# Patient Record
Sex: Male | Born: 1955
Health system: Southern US, Community
[De-identification: ages and names within clinical notes are randomized; demographics above are authoritative.]

## PROBLEM LIST (undated history)

## (undated) DIAGNOSIS — K222 Esophageal obstruction: Secondary | ICD-10-CM

## (undated) DIAGNOSIS — D369 Benign neoplasm, unspecified site: Secondary | ICD-10-CM

## (undated) DIAGNOSIS — K219 Gastro-esophageal reflux disease without esophagitis: Secondary | ICD-10-CM

## (undated) DIAGNOSIS — M1990 Unspecified osteoarthritis, unspecified site: Secondary | ICD-10-CM

## (undated) DIAGNOSIS — T7840XA Allergy, unspecified, initial encounter: Secondary | ICD-10-CM

## (undated) DIAGNOSIS — M21619 Bunion of unspecified foot: Secondary | ICD-10-CM

## (undated) DIAGNOSIS — M2040 Other hammer toe(s) (acquired), unspecified foot: Secondary | ICD-10-CM

## (undated) DIAGNOSIS — L409 Psoriasis, unspecified: Secondary | ICD-10-CM

## (undated) DIAGNOSIS — G709 Myoneural disorder, unspecified: Secondary | ICD-10-CM

## (undated) DIAGNOSIS — L97509 Non-pressure chronic ulcer of other part of unspecified foot with unspecified severity: Secondary | ICD-10-CM

## (undated) DIAGNOSIS — E785 Hyperlipidemia, unspecified: Secondary | ICD-10-CM

## (undated) DIAGNOSIS — M216X9 Other acquired deformities of unspecified foot: Secondary | ICD-10-CM

## (undated) DIAGNOSIS — D649 Anemia, unspecified: Secondary | ICD-10-CM

## (undated) DIAGNOSIS — I10 Essential (primary) hypertension: Secondary | ICD-10-CM

## (undated) HISTORY — PX: POLYPECTOMY: SHX149

## (undated) HISTORY — DX: Benign neoplasm, unspecified site: D36.9

## (undated) HISTORY — DX: Unspecified osteoarthritis, unspecified site: M19.90

## (undated) HISTORY — PX: JOINT REPLACEMENT: SHX530

## (undated) HISTORY — DX: Essential (primary) hypertension: I10

## (undated) HISTORY — DX: Hyperlipidemia, unspecified: E78.5

## (undated) HISTORY — DX: Other acquired deformities of unspecified foot: M21.6X9

## (undated) HISTORY — DX: Gastro-esophageal reflux disease without esophagitis: K21.9

## (undated) HISTORY — DX: Other hammer toe(s) (acquired), unspecified foot: M20.40

## (undated) HISTORY — DX: Psoriasis, unspecified: L40.9

## (undated) HISTORY — PX: COLONOSCOPY: SHX174

## (undated) HISTORY — PX: EYE SURGERY: SHX253

## (undated) HISTORY — PX: TOTAL HIP ARTHROPLASTY: SHX124

## (undated) HISTORY — DX: Bunion of unspecified foot: M21.619

## (undated) HISTORY — DX: Myoneural disorder, unspecified: G70.9

## (undated) HISTORY — DX: Allergy, unspecified, initial encounter: T78.40XA

---

## 1991-07-25 HISTORY — PX: HIP FRACTURE SURGERY: SHX118

## 2001-04-23 ENCOUNTER — Encounter: Payer: Self-pay | Admitting: Internal Medicine

## 2003-02-19 ENCOUNTER — Encounter: Payer: Self-pay | Admitting: Orthopedic Surgery

## 2003-02-23 ENCOUNTER — Inpatient Hospital Stay (HOSPITAL_COMMUNITY): Admission: RE | Admit: 2003-02-23 | Discharge: 2003-02-26 | Payer: Self-pay | Admitting: Orthopedic Surgery

## 2004-02-08 ENCOUNTER — Encounter: Payer: Self-pay | Admitting: Internal Medicine

## 2004-02-11 ENCOUNTER — Encounter: Payer: Self-pay | Admitting: Internal Medicine

## 2004-08-25 ENCOUNTER — Ambulatory Visit: Payer: Self-pay | Admitting: Internal Medicine

## 2005-02-27 ENCOUNTER — Ambulatory Visit: Payer: Self-pay | Admitting: Internal Medicine

## 2005-07-10 ENCOUNTER — Ambulatory Visit: Payer: Self-pay | Admitting: Internal Medicine

## 2005-09-06 ENCOUNTER — Ambulatory Visit: Payer: Self-pay | Admitting: Internal Medicine

## 2006-03-02 ENCOUNTER — Ambulatory Visit: Payer: Self-pay | Admitting: Internal Medicine

## 2006-03-27 ENCOUNTER — Ambulatory Visit: Payer: Self-pay | Admitting: Internal Medicine

## 2006-07-30 ENCOUNTER — Ambulatory Visit: Payer: Self-pay | Admitting: Gastroenterology

## 2006-08-06 ENCOUNTER — Ambulatory Visit: Payer: Self-pay | Admitting: Gastroenterology

## 2006-08-06 LAB — HM COLONOSCOPY

## 2006-09-03 ENCOUNTER — Ambulatory Visit: Payer: Self-pay | Admitting: Internal Medicine

## 2006-09-26 ENCOUNTER — Ambulatory Visit: Payer: Self-pay | Admitting: Internal Medicine

## 2007-01-03 ENCOUNTER — Telehealth (INDEPENDENT_AMBULATORY_CARE_PROVIDER_SITE_OTHER): Payer: Self-pay | Admitting: *Deleted

## 2007-01-23 ENCOUNTER — Encounter: Payer: Self-pay | Admitting: Internal Medicine

## 2007-03-13 ENCOUNTER — Encounter: Payer: Self-pay | Admitting: Internal Medicine

## 2007-03-13 DIAGNOSIS — E785 Hyperlipidemia, unspecified: Secondary | ICD-10-CM | POA: Insufficient documentation

## 2007-03-13 DIAGNOSIS — E1149 Type 2 diabetes mellitus with other diabetic neurological complication: Secondary | ICD-10-CM

## 2007-03-13 DIAGNOSIS — K219 Gastro-esophageal reflux disease without esophagitis: Secondary | ICD-10-CM

## 2007-03-13 DIAGNOSIS — M109 Gout, unspecified: Secondary | ICD-10-CM

## 2007-03-13 DIAGNOSIS — M1610 Unilateral primary osteoarthritis, unspecified hip: Secondary | ICD-10-CM | POA: Insufficient documentation

## 2007-03-13 DIAGNOSIS — L408 Other psoriasis: Secondary | ICD-10-CM

## 2007-03-13 DIAGNOSIS — I10 Essential (primary) hypertension: Secondary | ICD-10-CM | POA: Insufficient documentation

## 2007-03-17 DIAGNOSIS — M1990 Unspecified osteoarthritis, unspecified site: Secondary | ICD-10-CM | POA: Insufficient documentation

## 2007-03-29 ENCOUNTER — Ambulatory Visit: Payer: Self-pay | Admitting: Internal Medicine

## 2007-06-11 ENCOUNTER — Telehealth (INDEPENDENT_AMBULATORY_CARE_PROVIDER_SITE_OTHER): Payer: Self-pay | Admitting: *Deleted

## 2007-08-21 ENCOUNTER — Telehealth (INDEPENDENT_AMBULATORY_CARE_PROVIDER_SITE_OTHER): Payer: Self-pay | Admitting: *Deleted

## 2007-08-28 ENCOUNTER — Telehealth (INDEPENDENT_AMBULATORY_CARE_PROVIDER_SITE_OTHER): Payer: Self-pay | Admitting: *Deleted

## 2007-10-01 ENCOUNTER — Ambulatory Visit: Payer: Self-pay | Admitting: Internal Medicine

## 2007-10-07 ENCOUNTER — Telehealth: Payer: Self-pay | Admitting: Internal Medicine

## 2008-04-03 ENCOUNTER — Ambulatory Visit: Payer: Self-pay | Admitting: Internal Medicine

## 2008-04-04 ENCOUNTER — Encounter: Payer: Self-pay | Admitting: Internal Medicine

## 2008-09-28 ENCOUNTER — Telehealth: Payer: Self-pay | Admitting: Internal Medicine

## 2008-10-26 ENCOUNTER — Ambulatory Visit: Payer: Self-pay | Admitting: Internal Medicine

## 2008-10-26 DIAGNOSIS — R5381 Other malaise: Secondary | ICD-10-CM

## 2008-10-26 DIAGNOSIS — R5383 Other fatigue: Secondary | ICD-10-CM

## 2008-10-27 ENCOUNTER — Encounter: Payer: Self-pay | Admitting: Internal Medicine

## 2009-01-11 ENCOUNTER — Telehealth (INDEPENDENT_AMBULATORY_CARE_PROVIDER_SITE_OTHER): Payer: Self-pay | Admitting: *Deleted

## 2009-05-24 ENCOUNTER — Telehealth: Payer: Self-pay | Admitting: Internal Medicine

## 2009-05-28 ENCOUNTER — Ambulatory Visit: Payer: Self-pay | Admitting: Internal Medicine

## 2009-09-14 ENCOUNTER — Telehealth: Payer: Self-pay | Admitting: Internal Medicine

## 2009-09-14 ENCOUNTER — Encounter: Payer: Self-pay | Admitting: Internal Medicine

## 2009-12-02 ENCOUNTER — Telehealth: Payer: Self-pay | Admitting: Internal Medicine

## 2009-12-21 ENCOUNTER — Ambulatory Visit: Payer: Self-pay | Admitting: Internal Medicine

## 2009-12-22 ENCOUNTER — Encounter (INDEPENDENT_AMBULATORY_CARE_PROVIDER_SITE_OTHER): Payer: Self-pay | Admitting: *Deleted

## 2009-12-31 ENCOUNTER — Encounter (INDEPENDENT_AMBULATORY_CARE_PROVIDER_SITE_OTHER): Payer: Self-pay | Admitting: *Deleted

## 2010-03-21 ENCOUNTER — Telehealth: Payer: Self-pay | Admitting: Internal Medicine

## 2010-08-02 ENCOUNTER — Ambulatory Visit
Admission: RE | Admit: 2010-08-02 | Discharge: 2010-08-02 | Payer: Self-pay | Source: Home / Self Care | Attending: Internal Medicine | Admitting: Internal Medicine

## 2010-08-02 ENCOUNTER — Encounter: Payer: Self-pay | Admitting: Internal Medicine

## 2010-08-02 LAB — HM DIABETES FOOT EXAM

## 2010-08-22 ENCOUNTER — Ambulatory Visit
Admission: RE | Admit: 2010-08-22 | Discharge: 2010-08-22 | Payer: Self-pay | Source: Home / Self Care | Attending: Internal Medicine | Admitting: Internal Medicine

## 2010-08-22 DIAGNOSIS — J069 Acute upper respiratory infection, unspecified: Secondary | ICD-10-CM | POA: Insufficient documentation
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
## 2010-08-23 NOTE — Miscellaneous (Signed)
Summary: med list update  Medications Added BAYER CONTOUR TEST  STRP (GLUCOSE BLOOD) check blood sugar two times a day       Clinical Lists Changes  Medications: Changed medication from ASCENSIA AUTODISC TEST  DISK (GLUCOSE BLOOD) check blood sugar twice a day to BAYER CONTOUR TEST  STRP (GLUCOSE BLOOD) check blood sugar two times a day     Prior Medications: LANTUS SOLOSTAR 100 UNIT/ML SOLN (INSULIN GLARGINE) Inject 15 unit subcutaneously every day METFORMIN HCL 1000 MG TABS (METFORMIN HCL) Take 1 tablet by mouth twice a day ASPIRIN 81 MG  TBEC (ASPIRIN) Take one by mouth once a day LISINOPRIL 20 MG  TABS (LISINOPRIL) Take one by mouth once a day SIMVASTATIN 40 MG  TABS (SIMVASTATIN) Take one by mouth once a day PRILOSEC 20 MG  CPDR (OMEPRAZOLE) Take one by mouth every morning INDOMETHACIN 25 MG  CAPS (INDOMETHACIN) 1 three times a day as needed for gout pain TRIAMCINOLONE ACETONIDE 0.1 %  LOTN (TRIAMCINOLONE ACETONIDE) apply to rash three times a day as needed ALLOPURINOL 300 MG TABS (ALLOPURINOL) take 1 by mouth once daily CALCIUM 500 MG TABS (CALCIUM CARBONATE) take 1 by mouth once daily CINNAMON 500 MG  CAPS (CINNAMON) two times a day ONETOUCH ULTRA SYSTEM W/DEVICE KIT (BLOOD GLUCOSE MONITORING SUPPL) patient test blood sugar 1-2 times a day dx:250.00 ONETOUCH ULTRA TEST  STRP (GLUCOSE BLOOD) patient test blood sugar 1-2 times a day dx:250.00 DUANE READE UNIFINE PENTIPS 31G X 6 MM MISC (INSULIN PEN NEEDLE) use 1 needle subcutaneously once a day CURITY ALCOHOL SWABS   PADS (ALCOHOL SWABS) 1 SWAP Q DAY DOVONEX 0.005 %  SOLN (CALCIPOTRIENE)  MULTIVITAMINS   TABS (MULTIPLE VITAMIN) Take one by mouth once a day BAYER CONTOUR TEST  STRP (GLUCOSE BLOOD) check blood sugar two times a day Current Allergies: * LODINE (ETODOLAC) GLIPIZIDE (GLIPIZIDE) * DECONGESTANTS ALTACE (RAMIPRIL) IBUPROFEN (IBUPROFEN)

## 2010-08-23 NOTE — Miscellaneous (Signed)
Summary: med list update- test strips  Medications Added ASCENSIA AUTODISC TEST  DISK (GLUCOSE BLOOD) check blood sugar twice a day       Clinical Lists Changes  Medications: Added new medication of ASCENSIA AUTODISC TEST  DISK (GLUCOSE BLOOD) check blood sugar twice a day     Prior Medications: LANTUS SOLOSTAR 100 UNIT/ML SOLN (INSULIN GLARGINE) Inject 15 unit subcutaneously every day METFORMIN HCL 1000 MG TABS (METFORMIN HCL) Take 1 tablet by mouth twice a day ASPIRIN 81 MG  TBEC (ASPIRIN) Take one by mouth once a day LISINOPRIL 20 MG  TABS (LISINOPRIL) Take one by mouth once a day SIMVASTATIN 40 MG  TABS (SIMVASTATIN) Take one by mouth once a day PRILOSEC 20 MG  CPDR (OMEPRAZOLE) Take one by mouth every morning INDOMETHACIN 25 MG  CAPS (INDOMETHACIN) 1 three times a day as needed for gout pain TRIAMCINOLONE ACETONIDE 0.1 %  LOTN (TRIAMCINOLONE ACETONIDE) apply to rash three times a day as needed ALLOPURINOL 300 MG TABS (ALLOPURINOL) take 1 by mouth once daily CALCIUM 500 MG TABS (CALCIUM CARBONATE) take 1 by mouth once daily CINNAMON 500 MG  CAPS (CINNAMON) two times a day ONETOUCH ULTRA SYSTEM W/DEVICE KIT (BLOOD GLUCOSE MONITORING SUPPL) patient test blood sugar 1-2 times a day dx:250.00 ONETOUCH ULTRA TEST  STRP (GLUCOSE BLOOD) patient test blood sugar 1-2 times a day dx:250.00 DUANE READE UNIFINE PENTIPS 31G X 6 MM MISC (INSULIN PEN NEEDLE) use 1 needle subcutaneously once a day CURITY ALCOHOL SWABS   PADS (ALCOHOL SWABS) 1 SWAP Q DAY DOVONEX 0.005 %  SOLN (CALCIPOTRIENE)  MULTIVITAMINS   TABS (MULTIPLE VITAMIN) Take one by mouth once a day ASCENSIA AUTODISC TEST  DISK (GLUCOSE BLOOD) check blood sugar twice a day Current Allergies: * LODINE (ETODOLAC) GLIPIZIDE (GLIPIZIDE) * DECONGESTANTS ALTACE (RAMIPRIL) IBUPROFEN (IBUPROFEN)

## 2010-08-23 NOTE — Progress Notes (Signed)
Summary: Fax from pt  Phone Note Outgoing Call Call back at [facility] Phone [phone]   Call placed by: DeShannon Katrinka Blazing CMA [facility] Dull),  Dec 02, 2009 8:57 AM Call placed to: Patient Summary of Call: calling pt to discuss the fax that we received from him regarding his appt, and wanting [place] to re-schedule his appt. Initial call taken by: Mervin Hack CMA [facility] Dull),  Dec 02, 2009 8:58 AM  Follow-up for Phone Call        left message on machine at home for patient to return my call.  DeShannon Smith CMA [facility] Dull)  Dec 02, 2009 8:58 AM   patient called back and scheduled physical on 12/21/2009 @ 9:30 and pt would like to get labs that day. Follow-up by: Mervin Hack CMA [facility] Dull),  Dec 02, 2009 4:58 PM

## 2010-08-23 NOTE — Medication Information (Signed)
Summary: One Touch Ultra/[facility]  One Touch Ultra/[facility]   Imported By: Lanelle Bal 09/17/2009 09:38:15  _____________________________________________________________________  External Attachment:    Type:   Image     Comment:   External Document

## 2010-08-23 NOTE — Progress Notes (Signed)
Summary: refill request for hydrocortisone cream  Phone Note Refill Request Message from:  Fax from Pharmacy  Refills Requested: Medication #1:  hydrocortisone cream Faxed request from [facility], previous prescriber was Dr. Esperanza Sheets- form is on your desk.  Initial call taken by: Lowella Petties CMA,  March 21, 2010 2:35 PM  Follow-up for Phone Call        okay to send Rx for 45gm x 1 Follow-up by: Cindee Salt MD,  March 21, 2010 6:21 PM  Additional Follow-up for Phone Call Additional follow up Details #1::        Rx faxed to pharmacy Additional Follow-up by: DeShannon Smith CMA [facility] Dull),  March 22, 2010 8:29 AM    New/Updated Medications: HYDROCORTISONE VALERATE 0.2 % CREA (HYDROCORTISONE VALERATE) use as directed Prescriptions: HYDROCORTISONE VALERATE 0.2 % CREA (HYDROCORTISONE VALERATE) use as directed  #45gm x 1   Entered by:   Mervin Hack CMA (AAMA)   Authorized by:   Cindee Salt MD   Signed by:   Mervin Hack CMA (AAMA) on 03/22/2010   Method used:   Electronically to        [facility] [phone]* (retail)       [address] [place], [place]  [address]       Ph: [phone]       Fax: [phone]   RxID:   [id number]

## 2010-08-23 NOTE — Progress Notes (Signed)
Summary: need to change glucometer and strips  Phone Note From Pharmacy   Caller: [facility] Summary of Call: Form from [facility] is on your desk.  They are asking to change one touch basic system and strips- which has been d/c'd. Initial call taken by: Lowella Petties CMA,  September 14, 2009 12:50 PM  Follow-up for Phone Call        okay to change to one-touch ultra please update med list Follow-up by: Cindee Salt MD,  September 14, 2009 1:54 PM  Additional Follow-up for Phone Call Additional follow up Details #1::        changed in chart and form faxed back. Additional Follow-up by: Mervin Hack CMA [facility] Dull),  September 14, 2009 4:29 PM    New/Updated Medications: ONETOUCH ULTRA SYSTEM W/DEVICE KIT (BLOOD GLUCOSE MONITORING SUPPL) patient test blood sugar 1-2 times a day dx:250.00 ONETOUCH ULTRA TEST  STRP (GLUCOSE BLOOD) patient test blood sugar 1-2 times a day dx:250.00
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
## 2010-12-09 NOTE — Op Note (Signed)
NAME:  [name], [name]                           ACCOUNT NO.:  [id number]   MEDICAL RECORD NO.:  [id number]                   PATIENT TYPE:  INP   LOCATION:  2899                                 FACILITY:  [facility]   PHYSICIAN:  John L. Rendall III, M.D.           DATE OF BIRTH:  1956-03-04   DATE OF PROCEDURE:  02/23/2003  DATE OF DISCHARGE:                                 OPERATIVE REPORT   PREOPERATIVE DIAGNOSIS:  End-stage osteoarthritis, left hip, status post  previous fracture with retained Knowles pins.   SURGICAL PROCEDURE:  AML total hip replacement and removal of three Knowles  pins.   POSTOPERATIVE DIAGNOSIS:  End-stage osteoarthritis, left hip, status post  previous fracture with retained Knowles pins.   SURGEON:  John L. Rendall, M.D.   ASSISTANT:  Richardean Canal, P.A.   ANESTHESIA:  General.   PATHOLOGY:  The patient has previous hip fracture over 10 years ago fixed  with three Knowles pins.  In addition there is end-stage bone against bone  of the left hip.  There is a very large hip socket and acetabulum and  femoral head.   DESCRIPTION OF PROCEDURE:  Under general anesthesia, the left hip is  prepared with Duraprep and draped as a sterile field in the right lateral  decubitus position.  The previous 14-inch incision is opened over the mid-10  inches.  Dissection is carried through skin and IT band, inserting a  Charnley retractor.  The greater trochanter is palpated and carefully  dissected around, and one screw head is immediately visible.  This turns out  to be a threaded K-wire with a screw head on it.  First the screw head is  removed and then the chuck and drill are attached and the wire is backed  out.  The other two screws require more searching, and they are found buried  in periosteal scar tissue.  They were removed over the next 20 minutes.  Once this is completed, the hip is internally rotated.  The hip capsule and  short external rotators are taken  down with electrocautery.  The hip capsule  and rotators are dissected free of each other and the capsule is opened in a  T-shaped manner.  The femoral head is then incompletely dislocated due to  severe scar tissue medially. It is necessary to actually cut the neck and  then lever it out with a combination of hip skid and corkscrew.  After some  minor difficulty it is removed.  The IM initiator and canal finder are used.  The canal is then progressively reamed to a 14.5, which gives excellent  chatter.  Trial seatings of rasps were then used, 12, 13.5, and 15, and  calcar planers are used.  The 15 standard was the best fit.  Attention is  then turned to the acetabulum. It is debrided of scarred ligamentum teres  and capsule inferiorly.  The  labrum is excised.  It is then progressively  reamed up to a size 57, and a 58 mm liner gives good rim fit and a trial  acetabulum is inserted.  The trial stem, large 15, is used and several  different neck lengths and so forth are tried to get the best foot.  The  best fit was the +4 acetabular liner with the 10 degree angle, with the  large-stature high-offset neck length, with a +12 hip ball in a size 36 mm.  Any shorter neck length or smaller ball gave more tendency toward  dislocation with internal rotation.  This combination gave the optimum  stability.  It was also necessary to remove some acetabular rim medially to  make sure it did not lever against it or get in the way.  The permanent  acetabulum was then obtained.  A Pinnacle acetabular cup size 58, 300  Series, was inserted with an apex hole eliminator.  The +4 10-degree liner  was then inserted.  One more trial of the high-offset neck with the +12 36  mm ball gave good fit and alignment, and permanent components were then  obtained and the large-stature high-offset stem was inserted with the +13 36  mm ball.  At this point excellent stability was obtained and pull-down test  revealed 2-3 mm  of shuck.  The wound was copiously irrigated.  The capsule  was closed with #1 Tycron.  Short external rotators reattached with the  same.  IT band closed with #1 Tycron, subcu with 0 and 2-0 Vicryl, and skin  with clips.  Operative time approximately two hours.  The patient tolerated  the procedure well and returned to recovery in good condition.  Blood loss  estimated at 800 mL.  None was given.                                               John L. Dorothyann Gibbs, M.D.    Renato Gails  D:  02/23/2003  T:  02/23/2003  Job:  [id number]

## 2010-12-21 ENCOUNTER — Encounter: Payer: Self-pay | Admitting: Internal Medicine

## 2010-12-23 ENCOUNTER — Encounter: Payer: Self-pay | Admitting: Internal Medicine

## 2010-12-23 DIAGNOSIS — Z0289 Encounter for other administrative examinations: Secondary | ICD-10-CM

## 2011-01-31 ENCOUNTER — Other Ambulatory Visit: Payer: Self-pay | Admitting: *Deleted

## 2011-01-31 MED ORDER — INDOMETHACIN 25 MG PO CAPS: 25.0000 mg | ORAL_CAPSULE | Freq: Three times a day (TID) | ORAL | Status: DC

## 2011-04-17 ENCOUNTER — Encounter: Payer: Self-pay | Admitting: Internal Medicine

## 2011-04-17 ENCOUNTER — Ambulatory Visit (INDEPENDENT_AMBULATORY_CARE_PROVIDER_SITE_OTHER): Payer: 59 | Admitting: Internal Medicine

## 2011-04-17 DIAGNOSIS — M109 Gout, unspecified: Secondary | ICD-10-CM

## 2011-04-17 DIAGNOSIS — E785 Hyperlipidemia, unspecified: Secondary | ICD-10-CM

## 2011-04-17 DIAGNOSIS — I10 Essential (primary) hypertension: Secondary | ICD-10-CM

## 2011-04-17 DIAGNOSIS — Z0000 Encounter for general adult medical examination without abnormal findings: Secondary | ICD-10-CM

## 2011-04-17 DIAGNOSIS — E119 Type 2 diabetes mellitus without complications: Secondary | ICD-10-CM

## 2011-04-17 MED ORDER — METFORMIN HCL 1000 MG PO TABS: 1000.0000 mg | ORAL_TABLET | Freq: Two times a day (BID) | ORAL | Status: DC

## 2011-04-17 MED ORDER — TRIAMCINOLONE ACETONIDE 0.1 % EX LOTN: 1.0000 "application " | TOPICAL_LOTION | Freq: Three times a day (TID) | CUTANEOUS | Status: DC

## 2011-04-17 MED ORDER — INSULIN PEN NEEDLE 31G X 6 MM MISC: 1.0000 | Freq: Every day | Status: DC

## 2011-04-17 MED ORDER — HYDROCORTISONE VALERATE 0.2 % EX CREA: 1.0000 "application " | TOPICAL_CREAM | CUTANEOUS | Status: DC

## 2011-04-17 MED ORDER — SIMVASTATIN 40 MG PO TABS: 40.0000 mg | ORAL_TABLET | Freq: Every day | ORAL | Status: DC

## 2011-04-17 MED ORDER — OMEPRAZOLE 20 MG PO CPDR: 20.0000 mg | DELAYED_RELEASE_CAPSULE | Freq: Every day | ORAL | Status: DC

## 2011-04-17 MED ORDER — LISINOPRIL 20 MG PO TABS: 20.0000 mg | ORAL_TABLET | Freq: Every day | ORAL | Status: DC

## 2011-04-17 MED ORDER — INSULIN GLARGINE 100 UNIT/ML ~~LOC~~ SOLN: 15.0000 [IU] | Freq: Every day | SUBCUTANEOUS | Status: DC
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
## 2011-04-17 NOTE — Assessment & Plan Note (Signed)
BP Readings from Last 3 Encounters:  04/17/11 142/88  08/22/10 140/83  08/02/10 148/80   Fair control No changes in meds--needs to work on lifestyle

## 2011-04-17 NOTE — Assessment & Plan Note (Signed)
Allopurinol didn't seem to help No obvious gouty lesions now

## 2011-04-17 NOTE — Assessment & Plan Note (Signed)
Discussed fitness Generally doing okay Will check PSA after discussion Rx for labs to [facility]

## 2011-04-17 NOTE — Assessment & Plan Note (Signed)
Seems to have slipped some Will check labs

## 2011-04-17 NOTE — Assessment & Plan Note (Signed)
Due for labs

## 2011-04-28 ENCOUNTER — Encounter: Payer: Self-pay | Admitting: Internal Medicine

## 2011-05-17 ENCOUNTER — Other Ambulatory Visit: Payer: Self-pay | Admitting: *Deleted

## 2011-05-18 MED ORDER — INDOMETHACIN 25 MG PO CAPS: 25.0000 mg | ORAL_CAPSULE | Freq: Three times a day (TID) | ORAL | Status: DC | PRN

## 2011-05-18 NOTE — Telephone Encounter (Signed)
Rx sent electronically.  

## 2011-06-26 ENCOUNTER — Other Ambulatory Visit: Payer: Self-pay | Admitting: *Deleted

## 2011-06-26 MED ORDER — INSULIN PEN NEEDLE 31G X 6 MM MISC: 15.0000 [IU] | Freq: Every day | Status: DC

## 2011-07-06 ENCOUNTER — Other Ambulatory Visit: Payer: Self-pay | Admitting: *Deleted

## 2011-07-06 MED ORDER — INSULIN GLARGINE 100 UNIT/ML ~~LOC~~ SOLN: 15.0000 [IU] | Freq: Every day | SUBCUTANEOUS | Status: DC

## 2011-07-06 NOTE — Telephone Encounter (Signed)
Per pt he only needs to insulin, not the pen tip needles, [facility]. rx sent to pharmacy by [facility]

## 2011-08-25 ENCOUNTER — Telehealth: Payer: Self-pay | Admitting: Internal Medicine

## 2011-08-25 MED ORDER — METFORMIN HCL 1000 MG PO TABS: 1000.0000 mg | ORAL_TABLET | Freq: Two times a day (BID) | ORAL | Status: DC

## 2011-08-25 MED ORDER — LISINOPRIL 20 MG PO TABS: 20.0000 mg | ORAL_TABLET | Freq: Every day | ORAL | Status: DC

## 2011-08-25 NOTE — Telephone Encounter (Signed)
See note he faxed 1 month Rx sent

## 2011-09-07 ENCOUNTER — Encounter: Payer: Self-pay | Admitting: *Deleted

## 2011-09-07 ENCOUNTER — Encounter: Payer: Self-pay | Admitting: Family Medicine

## 2011-09-07 ENCOUNTER — Ambulatory Visit (INDEPENDENT_AMBULATORY_CARE_PROVIDER_SITE_OTHER): Payer: 59 | Admitting: Family Medicine

## 2011-09-07 ENCOUNTER — Telehealth: Payer: Self-pay | Admitting: Internal Medicine

## 2011-09-07 DIAGNOSIS — M79609 Pain in unspecified limb: Secondary | ICD-10-CM

## 2011-09-07 DIAGNOSIS — J069 Acute upper respiratory infection, unspecified: Secondary | ICD-10-CM | POA: Insufficient documentation

## 2011-09-07 DIAGNOSIS — M79604 Pain in right leg: Secondary | ICD-10-CM | POA: Insufficient documentation

## 2011-09-07 NOTE — Patient Instructions (Addendum)
You have an upper respiratory infection however likely viral as early on. Take diabetic tussin for cough at night. Push fluids and plenty of rest. Nasal saline irrigation or neti pot to help drain sinuses. May use simple mucinex with plenty of fluid to help mobilize mucous. Let us know if fever >101.5, trouble opening/closing mouth, difficulty swallowing, or worsening or going on past 10 days. I wonder if right leg pain is coming from back. For now use stretching /strengthening exerises provided, use tylenol or low dose ibuprofen(motrin).   If worsening please return sooner than next appt with PCP.

## 2011-09-07 NOTE — Telephone Encounter (Signed)
Triage Record Num: [id number] Operator: Durward Mallard DiMatteis Patient Name: [name] Call Date & Time: 09/07/2011 9:39:02AM Patient Phone: [phone] PCP: Tillman Abide Patient Gender: Male PCP Fax : [phone] Patient DOB: 1956/02/03 Practice Name: [facility] Reason for Call: Caller: [place]/Patient; PCP: Tillman Abide I.; CB#: [phone]; Call regarding Upper Resp Infection; sore throat and ear pain and congestion; temp 99.0 at present time; he is also having pain in the right hip and pt is concerned if it could be neuropathy; sx started 09/05/11; Triaged per URI Guideline; See in 24 hr d/t facial pain and temp elevation; Appt made for today at 4:00pm Dr Reece Agar; will comply Protocol(s) Used: Upper Respiratory Infection (URI) Recommended Outcome per Protocol: See Provider within 24 hours Reason for Outcome: Facial pain (fullness, pressure, worsens with bending over), frontal headache, yellow-green nasal discharge AND any temperature elevation Generalized mild frontal headache unresponsive to home care measures Care Advice: ~ SYMPTOM / CONDITION MANAGEMENT 09/07/2011 9:49:01AM Page 1 of 1 [email or web address]

## 2011-09-07 NOTE — Progress Notes (Signed)
  Subjective:    Patient ID: [name], male    DOB: 03-26-1956, 56 y.o.   MRN: [id number]  HPI CC: ? Head cold  3d h/o ST (bad in am), head congestion, hoarse voice, temp to 99.1, + PNdrainage, RN and slight HA described as mild dull ache.  Has tried OTC remedies like excedrin sinus and headache.  Also tried cholraseptic  No fevers/chills, abd pain, n/v, rashes, ear pain or tooth pain.  Out of work 2/2 this.  Needs to ensure not contagious (works at [employer])  Wife recently sick as well.  No smokers at home.  No h/o asthma, COPD.  Per pt last A1c 6.3%  Oh by the way... 3-4 wk h/o right leg pain.  Lateral hip pain then no pain leg then pain again down the lateral leg inferior to head of fibula to ankle and numbness sole of right foot.  Worse with sitting certain positions and hard surfaces.  No trauma/injury that started this.  No lower back pain.  No saddle anesthesia.  No bowel/bladder incontinence.  No leg weakness.  H/o L hip replacement 2004.  NSAIDs upset stomach.  Past Medical History  Diagnosis Date  . Diabetes mellitus   . GERD (gastroesophageal reflux disease)   . Gout   . Hyperlipidemia   . Hypertension   . Arthritis   . Psoriasis     Review of Systems Per HPI    Objective:   Physical Exam  Nursing note and vitals reviewed. Constitutional: He appears well-developed and well-nourished. No distress.  HENT:  Head: Normocephalic and atraumatic.  Right Ear: Hearing, tympanic membrane, external ear and ear canal normal.  Left Ear: Hearing, tympanic membrane, external ear and ear canal normal.  Nose: No mucosal edema or rhinorrhea. Right sinus exhibits maxillary sinus tenderness. Right sinus exhibits no frontal sinus tenderness. Left sinus exhibits maxillary sinus tenderness. Left sinus exhibits no frontal sinus tenderness.  Mouth/Throat: Uvula is midline and mucous membranes are normal. Posterior oropharyngeal erythema present. No oropharyngeal exudate, posterior  oropharyngeal edema or tonsillar abscesses.       Mild max sinus pressure bilaterally  Eyes: Conjunctivae and EOM are normal. Pupils are equal, round, and reactive to light. No scleral icterus.  Neck: Normal range of motion. Neck supple.  Cardiovascular: Normal rate, regular rhythm, normal heart sounds and intact distal pulses.   No murmur heard. Pulmonary/Chest: Effort normal and breath sounds normal. No respiratory distress. He has no wheezes. He has no rales.  Musculoskeletal: He exhibits no edema.       No pain midline.  No paraspinous pain No pain at SIJ, GTB, or sciatic notch. Neg SLR test, no pain with int/ext rotation at hip. Neg FABER test  Lymphadenopathy:    He has no cervical adenopathy.  Neurological: He is alert. He has normal strength. A sensory deficit (decreased sensation right sole) is present.  Reflex Scores:      Patellar reflexes are 2+ on the right side and 2+ on the left side.      Achilles reflexes are 2+ on the right side and 2+ on the left side. Skin: Skin is warm and dry. No rash noted.      Assessment & Plan:

## 2011-09-07 NOTE — Assessment & Plan Note (Signed)
Anticipate discogenic. Supportive care - conservative therapy first.  Tylenol for pain.  May use low dose ibuprofen as tolerates this well. Provided with stretching/strengthening exercises from [facility] pt advisor. F/u with PCP in 1 mo. discussed reasons to return sooner No red flags today.

## 2011-09-07 NOTE — Assessment & Plan Note (Signed)
Given duration anticipate viral etiology. Supportive care as per instructions.  could be early sinusitis, still likely viral. Red flags to notify us discussed.

## 2011-09-12 ENCOUNTER — Telehealth: Payer: Self-pay

## 2011-09-12 MED ORDER — AMOXICILLIN 500 MG PO TABS: 1000.0000 mg | ORAL_TABLET | Freq: Two times a day (BID) | ORAL | Status: DC

## 2011-09-12 NOTE — Telephone Encounter (Signed)
Okay to send Rx for amoxicillin 500mg   2 tabs bid #40 x 0

## 2011-09-12 NOTE — Telephone Encounter (Signed)
Patient was seen by Dr. Sharen Hones on Thursday last week for URI.  Dr. Sharen Hones told patient that if he wasn't any better by the first of this week to call and they would give him a medication.  He is still having a runny nose and congestion with cough.  His pharmacy is [facility].

## 2011-09-12 NOTE — Telephone Encounter (Signed)
rx sent to pharmacy by e-script Spoke with patient and advised results   

## 2011-09-20 ENCOUNTER — Encounter: Payer: Self-pay | Admitting: Gastroenterology

## 2011-09-29 ENCOUNTER — Other Ambulatory Visit: Payer: Self-pay | Admitting: *Deleted

## 2011-09-29 MED ORDER — INSULIN GLARGINE 100 UNIT/ML ~~LOC~~ SOLN: 15.0000 [IU] | Freq: Every day | SUBCUTANEOUS | Status: DC

## 2011-09-29 NOTE — Telephone Encounter (Signed)
rx sent to pharmacy by e[facility]script, [facility] per pt request

## 2011-10-18 ENCOUNTER — Ambulatory Visit: Payer: 59 | Admitting: Internal Medicine

## 2011-10-31 ENCOUNTER — Ambulatory Visit: Payer: 59 | Admitting: Internal Medicine

## 2011-11-08 ENCOUNTER — Encounter: Payer: Self-pay | Admitting: Internal Medicine

## 2011-11-08 ENCOUNTER — Ambulatory Visit (INDEPENDENT_AMBULATORY_CARE_PROVIDER_SITE_OTHER): Payer: 59 | Admitting: Internal Medicine

## 2011-11-08 VITALS — BP 140/88 | HR 79 | Temp 98.1°F | Ht 72.0 in | Wt 220.0 lb

## 2011-11-08 DIAGNOSIS — M25559 Pain in unspecified hip: Secondary | ICD-10-CM

## 2011-11-08 DIAGNOSIS — Z23 Encounter for immunization: Secondary | ICD-10-CM

## 2011-11-08 DIAGNOSIS — E119 Type 2 diabetes mellitus without complications: Secondary | ICD-10-CM

## 2011-11-08 DIAGNOSIS — E785 Hyperlipidemia, unspecified: Secondary | ICD-10-CM

## 2011-11-08 DIAGNOSIS — I10 Essential (primary) hypertension: Secondary | ICD-10-CM

## 2011-11-08 DIAGNOSIS — M25551 Pain in right hip: Secondary | ICD-10-CM | POA: Insufficient documentation

## 2011-11-08 MED ORDER — TRIAMCINOLONE ACETONIDE 0.1 % EX LOTN: 1.0000 "application " | TOPICAL_LOTION | Freq: Three times a day (TID) | CUTANEOUS | Status: DC

## 2011-11-08 MED ORDER — HYDROCORTISONE VALERATE 0.2 % EX OINT: 1.0000 "application " | TOPICAL_OINTMENT | Freq: Two times a day (BID) | CUTANEOUS | Status: DC

## 2011-11-08 MED ORDER — GLUCOSE BLOOD VI STRP: 1.0000 | ORAL_STRIP | Freq: Two times a day (BID) | Status: DC

## 2011-11-08 MED ORDER — INSULIN GLARGINE 100 UNIT/ML ~~LOC~~ SOLN: 15.0000 [IU] | Freq: Every day | SUBCUTANEOUS | Status: DC

## 2011-11-08 MED ORDER — CURITY ALCOHOL SWABS PADS: 1.0000 | MEDICATED_PAD | Status: DC | PRN

## 2011-11-08 NOTE — Progress Notes (Signed)
Subjective:    Patient ID: [name], male    DOB: [birth year], 56 y.o.   MRN: [id number]  HPI Doing okay Continues on rx for psoriasis---lotion for itching--can spread it out Ointment for specific spots Rarely uses the dovonex  Diabetes seems to be fine Checks 3-4 times per week---out of strips for a week Fasting usually 120-130 Has had a couple of mild hypoglycemic reactions--like if he forgets to eat lunch  No chest pain No SOB Head is stopped up with the allergies--hasn't started his meds yet No edema No regular exercise---is starting yard work  Right leg pain has improved Now has pain in right hip  Current Outpatient Prescriptions on File Prior to Visit  Medication Sig Dispense Refill  . aspirin 81 MG tablet Take 81 mg by mouth daily.        . Calcipotriene (DOVONEX) 0.005 % ointment Apply topically 2 (two) times daily.        . calcium gluconate 500 MG tablet Take 500 mg by mouth daily.        . Cinnamon 500 MG capsule Take 500 mg by mouth 2 (two) times daily.        . Curity Alcohol Swabs PADS as needed.        . indomethacin (INDOCIN) 25 MG capsule Take 1 capsule (25 mg total) by mouth 3 (three) times daily as needed.  100 capsule  3  . insulin glargine (LANTUS SOLOSTAR) 100 UNIT/ML injection Inject 15 Units into the skin at bedtime.  9 mL  3  . Insulin Pen Needle 31G X 6 MM MISC Inject 15 Units as directed daily.  300 each  3  . lisinopril (PRINIVIL,ZESTRIL) 20 MG tablet Take 1 tablet (20 mg total) by mouth daily.  30 tablet  0  . metFORMIN (GLUCOPHAGE) 1000 MG tablet Take 1 tablet (1,000 mg total) by mouth 2 (two) times daily with a meal.  60 tablet  0  . Multiple Vitamin (MULTIVITAMIN) capsule Take 1 capsule by mouth daily.        Marland Kitchen omeprazole (PRILOSEC) 20 MG capsule Take 1 capsule (20 mg total) by mouth daily.  90 capsule  3  . simvastatin (ZOCOR) 40 MG tablet Take 1 tablet (40 mg total) by mouth at bedtime.  90 tablet  3  . triamcinolone (KENALOG) 0.1 % lotion  Apply 1 application topically 3 (three) times daily.  60 mL  3    Allergies  Allergen Reactions  . Glipizide     REACTION: wt. gain, hands tingling  . Ibuprofen     REACTION: unspecified  . Ramipril     REACTION: cough    Past Medical History  Diagnosis Date  . Diabetes mellitus   . GERD (gastroesophageal reflux disease)   . Gout   . Hyperlipidemia   . Hypertension   . Arthritis   . Psoriasis     Past Surgical History  Procedure Date  . Hip fracture surgery 1993    surgery x2  . Total hip arthroplasty     Left hip replacement/femur fx 07/04, Screw removal left hip- [facility] 11/01    Family History  Problem Relation Age of Onset  . Diabetes Mother   . Cancer Neg Hx     History   Social History  . Marital Status: Married    Spouse Name: N/A    Number of Children: 3  . Years of Education: N/A   Occupational History  . PC ADMINISTRATOR  Social History Main Topics  . Smoking status: Never Smoker   . Smokeless tobacco: Never Used  . Alcohol Use: Yes     occasional (DWI 1991)  . Drug Use: No  . Sexually Active: Not on file   Other Topics Concern  . 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 Gets "run down in afternoon"    Objective:   Physical Exam  Constitutional: He appears well-developed and well-nourished. No distress.  Neck: Normal range of motion. Neck supple.  Cardiovascular: Normal rate, regular rhythm, normal heart sounds and intact distal pulses.  Exam reveals no gallop.   No murmur heard. Pulmonary/Chest: Effort normal and breath sounds normal. No respiratory distress. He has no wheezes. He has no rales.  Musculoskeletal: He exhibits no edema and no tenderness.  Lymphadenopathy:    He has no cervical adenopathy.  Skin: No rash noted.       No foot lesions  Psychiatric: He has a  normal mood and affect. His behavior is normal.          Assessment & Plan:

## 2011-11-08 NOTE — Assessment & Plan Note (Signed)
No problems with med Labs next time

## 2011-11-08 NOTE — Patient Instructions (Signed)
Please set up an appointment with Dr Patsy Lager to evaluate the right hip pain

## 2011-11-08 NOTE — Assessment & Plan Note (Signed)
Will set up with Dr Copland 

## 2011-11-08 NOTE — Assessment & Plan Note (Signed)
Seems to still have acceptable control Will check A1c 

## 2011-11-08 NOTE — Assessment & Plan Note (Signed)
Acceptable control  BP Readings from Last 3 Encounters:  11/08/11 140/88  09/07/11 128/76  04/17/11 142/88

## 2011-11-09 LAB — HEMOGLOBIN A1C: Hgb A1c MFr Bld: 6.5 % — ABNORMAL HIGH (ref 4.8–5.6)

## 2011-11-13 ENCOUNTER — Encounter: Payer: Self-pay | Admitting: *Deleted

## 2011-11-27 ENCOUNTER — Encounter: Payer: Self-pay | Admitting: Family Medicine

## 2011-11-27 ENCOUNTER — Ambulatory Visit (INDEPENDENT_AMBULATORY_CARE_PROVIDER_SITE_OTHER): Payer: 59 | Admitting: Family Medicine

## 2011-11-27 VITALS — BP 140/90 | HR 112 | Temp 98.3°F | Ht 73.0 in | Wt 224.8 lb

## 2011-11-27 DIAGNOSIS — M25559 Pain in unspecified hip: Secondary | ICD-10-CM

## 2011-11-27 DIAGNOSIS — G5700 Lesion of sciatic nerve, unspecified lower limb: Secondary | ICD-10-CM

## 2011-11-27 DIAGNOSIS — M25551 Pain in right hip: Secondary | ICD-10-CM

## 2011-11-27 NOTE — Patient Instructions (Signed)

## 2011-11-27 NOTE — Progress Notes (Signed)
  Patient Name: [name] Date of Birth: Feb 01, 1956 Age: 56 y.o. Medical Record Number: [id number] Gender: male Date of Encounter: 11/27/2011  History of Present Illness:  [name] is a 56 y.o. very pleasant male patient who presents with the following:  Pleasant gentleman with a history of left total hip arthroplasty in 2004, status post severe traumatic fracture of the left hip in 1993, who presents with some posterior buttocks pain, and he also had some pain there was a distal radiculopathy sort of sensation they have been ongoing for about 6 weeks, but that has since improved.  Now, primarily he is having pain in his posterior buttocks region. No pain in the groin. No pain laterally. No pain in his back  Right hip, for about six weeks, had pain in distal leg for about six weeks, feels back in his buttocks.  THA on the left hip.   L trauma 1993 Severe hip pain, 2004   Past Medical History, Surgical History, Social History, Family History, Problem List, Medications, and Allergies have been reviewed and updated if relevant.  Review of Systems:  GEN: No fevers, chills. Nontoxic. Primarily MSK c/o today. MSK: Detailed in the HPI GI: tolerating PO intake without difficulty Neuro: detailed above Otherwise the pertinent positives of the ROS are noted above.    Physical Examination: Filed Vitals:   11/27/11 0903  BP: 140/90  Pulse: 112  Temp: 98.3 F (36.8 C)  TempSrc: Oral  Height: 6\' 1"  (1.854 m)  Weight: 224 lb 12.8 oz (101.969 kg)  SpO2: 98%    Body mass index is 29.66 kg/(m^2).   GEN: WDWN, NAD, Non-toxic, Alert & Oriented x 3 HEENT: Atraumatic, Normocephalic.  Ears and Nose: No external deformity. EXTR: No clubbing/cyanosis/edema NEURO: Normal gait.  PSYCH: Normally interactive. Conversant. Not depressed or anxious appearing.  Calm demeanor.   HIP EXAM: SIDE: R ROM: Abduction, Flexion, Internal and External range of motion: Full Pain with terminal IROM  and EROM: No GTB: NT SLR: NEG Knees: No effusion FABER: NT REVERSE FABER: NT, neg Piriformis: TTP significantly Str: flexion: 5/5 abduction: 5/5 adduction: 5/5 Strength testing non-tender     Assessment and Plan: 1. Piriformis syndrome   2. Right hip pain     >25 minutes spent in face to face time with patient, >50% spent in counselling or coordination of care  Piriformis Syndrome Also given a handout with more extensive Piriformis stretching, hip flexor and abductor strengthening, ham stretching  Rec deep massage, explained self-massage with ball

## 2011-12-06 ENCOUNTER — Telehealth: Payer: Self-pay | Admitting: *Deleted

## 2011-12-06 NOTE — Telephone Encounter (Signed)
Calling pt because we have a denial for his bayer contour strips from [facility], need to see if he can use another brand. .left message to have patient return my call.

## 2011-12-06 NOTE — Telephone Encounter (Signed)
Spoke with patient and he doesn't understand why his strips where denied, per pt they switched to this new company in January, pt states he will call to see what the problem is, I asked pt to let us know if we need to change anything.

## 2012-01-22 LAB — HM DIABETES EYE EXAM

## 2012-04-25 ENCOUNTER — Encounter: Payer: Self-pay | Admitting: Gastroenterology

## 2012-05-24 ENCOUNTER — Ambulatory Visit (INDEPENDENT_AMBULATORY_CARE_PROVIDER_SITE_OTHER): Payer: 59 | Admitting: Family Medicine

## 2012-05-24 ENCOUNTER — Encounter: Payer: Self-pay | Admitting: Family Medicine

## 2012-05-24 ENCOUNTER — Other Ambulatory Visit: Payer: Self-pay | Admitting: *Deleted

## 2012-05-24 VITALS — BP 132/72 | HR 96 | Temp 98.5°F | Wt 230.0 lb

## 2012-05-24 DIAGNOSIS — R509 Fever, unspecified: Secondary | ICD-10-CM

## 2012-05-24 MED ORDER — METFORMIN HCL 1000 MG PO TABS: 1000.0000 mg | ORAL_TABLET | Freq: Two times a day (BID) | ORAL | Status: DC

## 2012-05-24 MED ORDER — LISINOPRIL 20 MG PO TABS: 20.0000 mg | ORAL_TABLET | Freq: Every day | ORAL | Status: DC

## 2012-05-24 NOTE — Patient Instructions (Addendum)
Drink plenty of fluids, take tylenol as needed, and this should gradually improve.  Take care.  Let us know if you have other concerns.    

## 2012-05-24 NOTE — Progress Notes (Signed)
Mult sick contacts at work.  HA started yesterday.  Last night, ~7pm had chills and a fever- 102.  Took some antipyretics.  Fevers broke ~midnight, returned at 2AM this AM and this broke again ~6 AM.  No tick bites.  No ear pain, ST, cough.  Not sob.  No rash (except for some hives last week that had resolved and his baseline psoriasis), vomiting, diarrhea.  No voice changes.    Works at [employer].  Not working the rest of the day or the weekend.    Flying out of town 05/29/12.    GEN: nad, alert and oriented HEENT: mucous membranes moist, tm wnl, nasal exam wnl except for R sided stuffiness, OP wnl except for minimal cobblestoning NECK: supple w/o LA CV: rrr. PULM: ctab, no inc wob ABD: soft, +bs EXT: no edema SKIN: no acute rash

## 2012-05-26 DIAGNOSIS — R509 Fever, unspecified: Secondary | ICD-10-CM | POA: Insufficient documentation

## 2012-05-26 NOTE — Assessment & Plan Note (Signed)
Likely viral, nontoxic, should resolve.  Supportive tx, f/u prn.

## 2012-06-24 ENCOUNTER — Encounter: Payer: Self-pay | Admitting: Internal Medicine

## 2012-06-24 ENCOUNTER — Ambulatory Visit (INDEPENDENT_AMBULATORY_CARE_PROVIDER_SITE_OTHER): Payer: 59 | Admitting: Internal Medicine

## 2012-06-24 VITALS — BP 150/80 | HR 78 | Temp 98.4°F | Ht 72.0 in | Wt 229.0 lb

## 2012-06-24 DIAGNOSIS — I10 Essential (primary) hypertension: Secondary | ICD-10-CM

## 2012-06-24 DIAGNOSIS — Z23 Encounter for immunization: Secondary | ICD-10-CM

## 2012-06-24 DIAGNOSIS — Z0000 Encounter for general adult medical examination without abnormal findings: Secondary | ICD-10-CM

## 2012-06-24 DIAGNOSIS — K219 Gastro-esophageal reflux disease without esophagitis: Secondary | ICD-10-CM

## 2012-06-24 DIAGNOSIS — E785 Hyperlipidemia, unspecified: Secondary | ICD-10-CM

## 2012-06-24 DIAGNOSIS — E119 Type 2 diabetes mellitus without complications: Secondary | ICD-10-CM

## 2012-06-24 MED ORDER — TRIAMCINOLONE ACETONIDE 0.1 % EX LOTN: 1.0000 "application " | TOPICAL_LOTION | Freq: Three times a day (TID) | CUTANEOUS | Status: DC

## 2012-06-24 MED ORDER — LISINOPRIL 20 MG PO TABS: 20.0000 mg | ORAL_TABLET | Freq: Every day | ORAL | Status: DC

## 2012-06-24 MED ORDER — CALCIPOTRIENE 0.005 % EX SOLN: 1.0000 "application " | Freq: Two times a day (BID) | CUTANEOUS | Status: DC

## 2012-06-24 MED ORDER — METFORMIN HCL 1000 MG PO TABS: 1000.0000 mg | ORAL_TABLET | Freq: Two times a day (BID) | ORAL | Status: DC

## 2012-06-24 MED ORDER — SIMVASTATIN 40 MG PO TABS: 40.0000 mg | ORAL_TABLET | Freq: Every day | ORAL | Status: DC

## 2012-06-24 MED ORDER — HYDROCORTISONE VALERATE 0.2 % EX OINT: 1.0000 "application " | TOPICAL_OINTMENT | Freq: Two times a day (BID) | CUTANEOUS | Status: DC

## 2012-06-24 MED ORDER — ULTILET ALCOHOL SWAB PADS: 1.0000 "application " | MEDICATED_PAD | Freq: Two times a day (BID) | Status: DC

## 2012-06-24 MED ORDER — INSULIN GLARGINE 100 UNIT/ML ~~LOC~~ SOLN: 15.0000 [IU] | Freq: Every day | SUBCUTANEOUS | Status: DC

## 2012-06-24 MED ORDER — INSULIN PEN NEEDLE 31G X 6 MM MISC: 15.0000 [IU] | Freq: Every day | Status: DC

## 2012-06-24 MED ORDER — OMEPRAZOLE 20 MG PO CPDR: 20.0000 mg | DELAYED_RELEASE_CAPSULE | Freq: Every day | ORAL | Status: DC

## 2012-06-24 MED ORDER — ONETOUCH BASIC SYSTEM W/DEVICE KIT: PACK | Status: DC

## 2012-06-24 MED ORDER — GLUCOSE BLOOD VI STRP: ORAL_STRIP | Status: DC

## 2012-06-24 MED ORDER — INDOMETHACIN 25 MG PO CAPS: 25.0000 mg | ORAL_CAPSULE | Freq: Three times a day (TID) | ORAL | Status: DC | PRN

## 2012-06-24 NOTE — Addendum Note (Signed)
Addended by: Sueanne Margarita on: 06/24/2012 04:18 PM   Modules accepted: Orders

## 2012-06-24 NOTE — Assessment & Plan Note (Signed)
hopefully still good control Due for labs

## 2012-06-24 NOTE — Assessment & Plan Note (Signed)
Fine on the med 

## 2012-06-24 NOTE — Assessment & Plan Note (Signed)
BP Readings from Last 3 Encounters:  06/24/12 150/80  05/24/12 132/72  11/27/11 140/90   Repeat on right 144/86  No changes for now He will work on fitness

## 2012-06-24 NOTE — Assessment & Plan Note (Signed)
Healthy Needs to work more on fitness Discussed PSA---will defer to at least next year

## 2012-06-24 NOTE — Progress Notes (Signed)
Subjective:    Patient ID: [name], male    DOB: 1956/02/05, 56 y.o.   MRN: [id number]  HPI Fever did resolve quickly  Has had feeling of congestion in right ear--like in airplane Is able to pop it by pulling on it (the pinna) Wants it checked Annoying but not painful  Having problems with right foot Blister after ?rock in foot Apparently got infected and he saw Dr Al Corpus 3 different bacteria including MRSA Took 2 different antibiotics for 28 days----clinda and another  Checks sugars several times a week--- out of strips for some time Needs new meter and blood strips---Rx given  Current Outpatient Prescriptions on File Prior to Visit  Medication Sig Dispense Refill  . aspirin 81 MG tablet Take 81 mg by mouth daily.        . calcium gluconate 500 MG tablet Take 500 mg by mouth daily.        . Cinnamon 500 MG capsule Take 500 mg by mouth 2 (two) times daily.        . Multiple Vitamin (MULTIVITAMIN) capsule Take 1 capsule by mouth daily.        . [DISCONTINUED] insulin glargine (LANTUS SOLOSTAR) 100 UNIT/ML injection Inject 15 Units into the skin at bedtime.  10 mL  3  . [DISCONTINUED] lisinopril (PRINIVIL,ZESTRIL) 20 MG tablet Take 1 tablet (20 mg total) by mouth daily.  90 tablet  0  . [DISCONTINUED] metFORMIN (GLUCOPHAGE) 1000 MG tablet Take 1 tablet (1,000 mg total) by mouth 2 (two) times daily with a meal.  180 tablet  0  . [DISCONTINUED] omeprazole (PRILOSEC) 20 MG capsule Take 1 capsule (20 mg total) by mouth daily.  90 capsule  3  . [DISCONTINUED] simvastatin (ZOCOR) 40 MG tablet Take 1 tablet (40 mg total) by mouth at bedtime.  90 tablet  3    Allergies  Allergen Reactions  . Glipizide     REACTION: wt. gain, hands tingling  . Ibuprofen     REACTION: unspecified  . Ramipril     REACTION: cough    Past Medical History  Diagnosis Date  . Diabetes mellitus   . GERD (gastroesophageal reflux disease)   . Gout   . Hyperlipidemia   . Hypertension   . Arthritis     . Psoriasis     Past Surgical History  Procedure Date  . Hip fracture surgery 1993    surgery x2  . Total hip arthroplasty     Left hip replacement/femur fx 07/04, Screw removal left hip- [facility] 11/01    Family History  Problem Relation Age of Onset  . Diabetes Mother   . Cancer Neg Hx     History   Social History  . Marital Status: Married    Spouse Name: N/A    Number of Children: 3  . Years of Education: N/A   Occupational History  . PC ADMINISTRATOR    Social History Main Topics  . Smoking status: Never Smoker   . Smokeless tobacco: Never Used  . Alcohol Use: Yes     Comment: occasional (DWI 1991)  . Drug Use: No  . Sexually Active: Not on file   Other Topics Concern  . 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       Wears seat belt  HENT: Positive for congestion, rhinorrhea and  tinnitus. Negative for hearing loss.        Constant tinnitus for years regular with dentist Allergy problems seem better this year  Eyes: Negative for visual disturbance.       No diplopia or unilateral vision loss  Respiratory: Negative for cough, chest tightness and shortness of breath.   Cardiovascular: Negative for chest pain, palpitations and leg swelling.  Gastrointestinal: Negative for nausea, vomiting, abdominal pain, diarrhea, constipation and blood in stool.       No heartburn on the omeprazole  Genitourinary: Negative for urgency, frequency and difficulty urinating.       Somewhat slower stream No sexual problems  Musculoskeletal: Negative for back pain, joint swelling and arthralgias.       No recent gout issues  Skin: Positive for rash.       Psoriasis is more noticeable in winter--- creams are helpful for itching  Had hives with one of the antibiotics---stopped and they resolved (did rechallenge without a problem)  Neurological:  Positive for light-headedness and headaches. Negative for dizziness, syncope, weakness and numbness.       Occasional headaches lately---seem to be sinus   Hematological: Negative for adenopathy. Bruises/bleeds easily.       Bruises easy on the aspirin  Psychiatric/Behavioral: Negative for sleep disturbance and dysphoric mood. The patient is not nervous/anxious.        Objective:   Physical Exam  Constitutional: He is oriented to person, place, and time. He appears well-developed and well-nourished. No distress.  HENT:  Head: Normocephalic and atraumatic.  Right Ear: External ear normal.  Left Ear: External ear normal.  Mouth/Throat: Oropharynx is clear and moist. No oropharyngeal exudate.  Eyes: Conjunctivae normal and EOM are normal. Pupils are equal, round, and reactive to light.  Neck: Normal range of motion. Neck supple. No thyromegaly present.  Cardiovascular: Normal rate, regular rhythm, normal heart sounds and intact distal pulses.  Exam reveals no gallop.   No murmur heard. Pulmonary/Chest: Effort normal and breath sounds normal. No respiratory distress. He has no wheezes. He has no rales.  Abdominal: Soft. There is no tenderness.  Musculoskeletal: He exhibits no edema and no tenderness.  Lymphadenopathy:    He has no cervical adenopathy.  Neurological: He is alert and oriented to person, place, and time.  Skin:       No signs of infection in right foot now Just slight plantar scab at site of infection now  Psychiatric: He has a normal mood and affect. His behavior is normal.          Assessment & Plan:

## 2012-06-24 NOTE — Assessment & Plan Note (Signed)
No problems with meds Due for recheck

## 2012-06-26 LAB — BASIC METABOLIC PANEL
Calcium: 9.4 mg/dL (ref 8.7–10.2)
Creatinine, Ser: 0.96 mg/dL (ref 0.76–1.27)
GFR calc Af Amer: 102 mL/min/{1.73_m2} (ref >59)
GFR calc non Af Amer: 88 mL/min/{1.73_m2} (ref >59)
Glucose: 94 mg/dL (ref 65–99)

## 2012-06-26 LAB — LIPID PANEL
Cholesterol, Total: 150 mg/dL (ref 100–199)
HDL: 64 mg/dL (ref >39)
LDL Calculated: 67 mg/dL (ref 0–99)
Triglycerides: 95 mg/dL (ref 0–149)

## 2012-06-26 LAB — CBC WITH DIFFERENTIAL/PLATELET
Basos: 1 % (ref 0–3)
Eos: 3 % (ref 0–5)
HCT: 36.1 % — ABNORMAL LOW (ref 37.5–51.0)
Hemoglobin: 12.5 g/dL — ABNORMAL LOW (ref 12.6–17.7)
Lymphocytes Absolute: 1.8 10*3/uL (ref 0.7–3.1)
MCV: 91 fL (ref 79–97)
Monocytes Absolute: 0.5 10*3/uL (ref 0.1–0.9)
Monocytes: 8 % (ref 4–12)
Neutrophils Absolute: 3 10*3/uL (ref 1.4–7.0)
WBC: 5.4 10*3/uL (ref 3.4–10.8)

## 2012-06-26 LAB — HEPATIC FUNCTION PANEL
Albumin: 4.5 g/dL (ref 3.5–5.5)
Alkaline Phosphatase: 83 IU/L (ref 39–117)
Bilirubin, Direct: 0.22 mg/dL (ref 0.00–0.40)
Total Bilirubin: 0.5 mg/dL (ref 0.0–1.2)
Total Protein: 6.6 g/dL (ref 6.0–8.5)

## 2012-06-26 LAB — MICROALBUMIN / CREATININE URINE RATIO: MICROALB/CREAT RATIO: 12.7 mg/g creat (ref 0.0–30.0)

## 2012-06-26 LAB — HEMOGLOBIN A1C: Est. average glucose Bld gHb Est-mCnc: 163 mg/dL

## 2012-06-27 ENCOUNTER — Encounter: Payer: Self-pay | Admitting: *Deleted

## 2012-12-31 ENCOUNTER — Ambulatory Visit: Payer: 59 | Admitting: Internal Medicine

## 2013-01-03 ENCOUNTER — Ambulatory Visit: Payer: Self-pay | Admitting: Podiatry

## 2013-01-03 LAB — CREATININE, SERUM
EGFR (African American): >60
EGFR (Non-African Amer.): >60

## 2013-01-21 ENCOUNTER — Encounter: Payer: Self-pay | Admitting: Internal Medicine

## 2013-01-21 ENCOUNTER — Ambulatory Visit (INDEPENDENT_AMBULATORY_CARE_PROVIDER_SITE_OTHER): Payer: 59 | Admitting: Internal Medicine

## 2013-01-21 VITALS — BP 158/80 | HR 88 | Temp 98.3°F | Wt 231.0 lb

## 2013-01-21 DIAGNOSIS — K219 Gastro-esophageal reflux disease without esophagitis: Secondary | ICD-10-CM

## 2013-01-21 DIAGNOSIS — E1149 Type 2 diabetes mellitus with other diabetic neurological complication: Secondary | ICD-10-CM

## 2013-01-21 DIAGNOSIS — I10 Essential (primary) hypertension: Secondary | ICD-10-CM

## 2013-01-21 DIAGNOSIS — E785 Hyperlipidemia, unspecified: Secondary | ICD-10-CM

## 2013-01-21 MED ORDER — LISINOPRIL 40 MG PO TABS: 40.0000 mg | ORAL_TABLET | Freq: Every day | ORAL | Status: DC

## 2013-01-21 NOTE — Patient Instructions (Signed)
Please increase the lisinopril to 40mg  daily Please get a pair of stability shoes to help your feet.

## 2013-01-21 NOTE — Assessment & Plan Note (Signed)
BP Readings from Last 3 Encounters:  01/21/13 158/80  06/24/12 150/80  05/24/12 132/72   Repeat 154/94 on right Will increase the lisinopril

## 2013-01-21 NOTE — Assessment & Plan Note (Signed)
Hopefully still good control Mild neuropathy in feet Check A1c

## 2013-01-21 NOTE — Assessment & Plan Note (Signed)
Symptoms controlled the PPI Should give protection while on the indomethacin

## 2013-01-21 NOTE — Assessment & Plan Note (Signed)
No problems with the statin Lab Results  Component Value Date   LDLCALC 67 06/24/2012

## 2013-01-21 NOTE — Progress Notes (Signed)
Subjective:    Patient ID: [name], male    DOB: 1956-04-08, 57 y.o.   MRN: [id number]  HPI Having trouble with his right foot Toes are curling up and is going to have to have surgery Just had MRI and is awaiting the results Both feet are numb---right more than left Did have some cortisone shots---helped the pain for a while This has limited his exercise Using the indocin bid regularly  Checks sugars every other day Up and down Did have hypoglycemic reactions this past weekend-- afternoon (no neuroglycopenia)  Blood pressure has been up some as well 166/86 in store Occasional sinus headaches No chest pain No SOB  No problems with the statin  No apparent gout Not apparently the reason for the foot problems  Current Outpatient Prescriptions on File Prior to Visit  Medication Sig Dispense Refill  . Alcohol Swabs (ULTILET ALCOHOL SWAB) PADS Apply 1 application topically 2 (two) times daily.  180 each  3  . aspirin 81 MG tablet Take 81 mg by mouth daily.        . Blood Glucose Monitoring Suppl (ONE TOUCH BASIC SYSTEM) W/DEVICE KIT 1 unit to test blood sugar twice daily. Dx: 250.00  1 each  0  . Calcipotriene (DOVONEX) 0.005 % solution Apply 1 application topically 2 (two) times daily.  60 mL  3  . calcium gluconate 500 MG tablet Take 500 mg by mouth daily.        . Cinnamon 500 MG capsule Take 500 mg by mouth 2 (two) times daily.        Marland Kitchen glucose blood (ONE TOUCH TEST STRIPS) test strip 1 each by Other route 2 (two) times daily. Dx: 250.00 Use as instructed  200 each  3  . hydrocortisone valerate ointment (WEST-CORT) 0.2 % Apply 1 application topically 2 (two) times daily.  60 g  3  . indomethacin (INDOCIN) 25 MG capsule Take 1 capsule (25 mg total) by mouth 3 (three) times daily as needed.  90 capsule  3  . insulin glargine (LANTUS SOLOSTAR) 100 UNIT/ML injection Inject 15 Units into the skin at bedtime.  10 mL  3  . Insulin Pen Needle 31G X 6 MM MISC Inject 15 Units as  directed daily.  200 each  3  . lisinopril (PRINIVIL,ZESTRIL) 20 MG tablet Take 1 tablet (20 mg total) by mouth daily.  90 tablet  3  . metFORMIN (GLUCOPHAGE) 1000 MG tablet Take 1 tablet (1,000 mg total) by mouth 2 (two) times daily with a meal.  180 tablet  3  . Multiple Vitamin (MULTIVITAMIN) capsule Take 1 capsule by mouth daily.        Marland Kitchen omeprazole (PRILOSEC) 20 MG capsule Take 1 capsule (20 mg total) by mouth daily.  90 capsule  3  . simvastatin (ZOCOR) 40 MG tablet Take 1 tablet (40 mg total) by mouth at bedtime.  90 tablet  3  . triamcinolone lotion (KENALOG) 0.1 % Apply 1 application topically 3 (three) times daily.  60 mL  3   No current facility-administered medications on file prior to visit.    Allergies  Allergen Reactions  . Glipizide     REACTION: wt. gain, hands tingling  . Ibuprofen     REACTION: unspecified  . Ramipril     REACTION: cough    Past Medical History  Diagnosis Date  . Diabetes mellitus   . GERD (gastroesophageal reflux disease)   . Gout   . Hyperlipidemia   .  Hypertension   . Arthritis   . Psoriasis     Past Surgical History  Procedure Laterality Date  . Hip fracture surgery  1993    surgery x2  . Total hip arthroplasty      Left hip replacement/femur fx 07/04, Screw removal left hip- Hines 11/01    Family History  Problem Relation Age of Onset  . Diabetes Mother   . Cancer Neg Hx     History   Social History  . Marital Status: Married    Spouse Name: N/A    Number of Children: 3  . Years of Education: N/A   Occupational History  . PC ADMINISTRATOR    Social History Main Topics  . Smoking status: Never Smoker   . Smokeless tobacco: Never Used  . Alcohol Use: Yes     Comment: occasional (DWI 1991)  . Drug Use: No  . Sexually Active: Not on file   Other Topics Concern  . 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 No distress.  Neck: Normal range of motion. Neck supple. No thyromegaly present.  Cardiovascular: Normal rate, regular rhythm, normal heart sounds and intact distal pulses.  Exam reveals no gallop.   No murmur heard. Pulmonary/Chest: Effort normal and breath sounds normal. No respiratory distress. He has no wheezes. He has no rales.  Musculoskeletal: He exhibits no edema.  Mild bunion on right Mild hammertoes in 2nd and 3rd right toes  Lymphadenopathy:    He has no cervical adenopathy.  Skin: No rash noted. No erythema.  Psychiatric: He has a normal mood and affect. His behavior is normal.          Assessment & Plan:

## 2013-01-22 LAB — HEMOGLOBIN A1C: Est. average glucose Bld gHb Est-mCnc: 163 mg/dL

## 2013-01-28 ENCOUNTER — Encounter: Payer: Self-pay | Admitting: Internal Medicine

## 2013-02-28 HISTORY — PX: FOOT SURGERY: SHX648

## 2013-04-02 DIAGNOSIS — M21619 Bunion of unspecified foot: Secondary | ICD-10-CM

## 2013-04-02 HISTORY — DX: Bunion of unspecified foot: M21.619

## 2013-04-15 ENCOUNTER — Encounter: Payer: Self-pay | Admitting: *Deleted

## 2013-04-15 DIAGNOSIS — M2040 Other hammer toe(s) (acquired), unspecified foot: Secondary | ICD-10-CM | POA: Insufficient documentation

## 2013-04-15 DIAGNOSIS — M216X9 Other acquired deformities of unspecified foot: Secondary | ICD-10-CM | POA: Insufficient documentation

## 2013-04-24 ENCOUNTER — Encounter: Payer: Self-pay | Admitting: Podiatry

## 2013-04-24 ENCOUNTER — Ambulatory Visit (INDEPENDENT_AMBULATORY_CARE_PROVIDER_SITE_OTHER): Payer: 59

## 2013-04-24 ENCOUNTER — Ambulatory Visit (INDEPENDENT_AMBULATORY_CARE_PROVIDER_SITE_OTHER): Payer: 59 | Admitting: Podiatry

## 2013-04-24 VITALS — BP 140/86 | HR 87 | Temp 97.9°F | Resp 16 | Ht 73.0 in | Wt 232.4 lb

## 2013-04-24 DIAGNOSIS — S8290XD Unspecified fracture of unspecified lower leg, subsequent encounter for closed fracture with routine healing: Secondary | ICD-10-CM

## 2013-04-24 DIAGNOSIS — S92901A Unspecified fracture of right foot, initial encounter for closed fracture: Secondary | ICD-10-CM | POA: Insufficient documentation

## 2013-04-24 DIAGNOSIS — M2010 Hallux valgus (acquired), unspecified foot: Secondary | ICD-10-CM

## 2013-04-24 DIAGNOSIS — S92901G Unspecified fracture of right foot, subsequent encounter for fracture with delayed healing: Secondary | ICD-10-CM

## 2013-04-24 NOTE — Progress Notes (Signed)
Mr. Stehlik presents today his date of surgery was 04/02/2013. Consisting of an [place] bunion repair right second metatarsal osteotomy right fifth metatarsal osteotomy right hammertoe repair #2 #3 and #5 right. He states that one week after I saw him last, which would've been around September 17, he recall injuring his surgical foot. He states since that time it has been swollen and painful. He denies fever chills nausea or vomiting.  Objective: He presents today with a surgical shoe and a compression dressing. He has moderate to severe edema no erythema cellulitis drainage or odor. Tenderness on range of motion of the first and second metatarsophalangeal joints right foot. Radiographic evaluation today does demonstrate a dorsiflexor reinjury that has resulted in distraction of the first metatarsal osteotomy, fracture of the second metatarsal osteotomy, dislocation of the third metatarsal phalangeal joint, and fracture of the fifth metatarsal osteotomy site.  Assessment: Multiple fractures to the right foot sustained after traumatic injury.  Plan: We discussed in great detail today repair of these postsurgical fractures. We went over consent form today, line bylined number by number giving him ample time to ask questions he saw fit regarding open reduction and internal fixation of the aforementioned fracture sites. I answered all the questions regarding his procedures to the best of my ability in layman's terms. He understood this and was amenable to it. He understands that the outcome of this surgery is probably going to be less than perfect secondary to previous surgical procedures. He saw Dr. pages of the consent form. I will followup with him next week at the [facility] specialty surgical center.

## 2013-04-24 NOTE — Patient Instructions (Signed)
Schedule for Surgery next Friday.  Ice and elevate right foot and use compression until day of surgery.  I suggest you use the cam boot until we get this repaired.

## 2013-04-25 ENCOUNTER — Telehealth: Payer: Self-pay | Admitting: *Deleted

## 2013-04-25 NOTE — Telephone Encounter (Signed)
Pt called and left a voicemail asking if we could email him his xrays of his right foot from yesterday, also needed to clarify the shoe is was suppose to be wearing.  Called pt back told him we could not email xray to him.. Also he is to be wearing air fracture walker. Pt understood

## 2013-05-01 ENCOUNTER — Other Ambulatory Visit: Payer: Self-pay | Admitting: Podiatry

## 2013-05-01 MED ORDER — PROMETHAZINE HCL 25 MG PO TABS: 25.0000 mg | ORAL_TABLET | Freq: Three times a day (TID) | ORAL | Status: DC | PRN

## 2013-05-01 MED ORDER — CEPHALEXIN 500 MG PO CAPS: 500.0000 mg | ORAL_CAPSULE | Freq: Three times a day (TID) | ORAL | Status: DC

## 2013-05-01 MED ORDER — OXYCODONE-ACETAMINOPHEN 10-325 MG PO TABS: ORAL_TABLET | ORAL | Status: DC

## 2013-05-02 ENCOUNTER — Encounter: Payer: Self-pay | Admitting: Podiatry

## 2013-05-02 DIAGNOSIS — S92309A Fracture of unspecified metatarsal bone(s), unspecified foot, initial encounter for closed fracture: Secondary | ICD-10-CM

## 2013-05-02 DIAGNOSIS — Z472 Encounter for removal of internal fixation device: Secondary | ICD-10-CM

## 2013-05-05 ENCOUNTER — Telehealth: Payer: Self-pay | Admitting: *Deleted

## 2013-05-05 NOTE — Telephone Encounter (Signed)
CALLED TO CHECK ON [name], TO SEE HOW HE WAS DOING AFTER SURGERY, HE STATED HE WAS DOING AS GOO AS HE CAN NO UNCOMFORTABLE BANDAGE, NO MORE DRAINAGE COMING FROM BANDAGE   NO SHORTNESS OF BREATHING   NO CALF PAIN ON SCALE FROM 1-10 HE RATES HIS PAIN AS A 2  PATIENT IS TO CALL us IF NEEDED

## 2013-05-08 ENCOUNTER — Ambulatory Visit (INDEPENDENT_AMBULATORY_CARE_PROVIDER_SITE_OTHER): Payer: 59 | Admitting: Podiatry

## 2013-05-08 ENCOUNTER — Encounter: Payer: Self-pay | Admitting: Podiatry

## 2013-05-08 ENCOUNTER — Ambulatory Visit (INDEPENDENT_AMBULATORY_CARE_PROVIDER_SITE_OTHER): Payer: 59

## 2013-05-08 VITALS — BP 125/81 | HR 83 | Temp 97.6°F | Resp 16 | Wt 230.0 lb

## 2013-05-08 DIAGNOSIS — S92309A Fracture of unspecified metatarsal bone(s), unspecified foot, initial encounter for closed fracture: Secondary | ICD-10-CM

## 2013-05-08 NOTE — Progress Notes (Signed)
[name] presents today one week status post open reduction and internal fixation of the right foot. This is status post correction of the forefoot right. Traumatically induced fracture as it was healing. Now being these one week status post open reduction and internal fixation. He presents in a cast on crutches. He denies fever chills nausea vomiting muscle aches or pains. Denies any trauma to the right foot. Has been using his crutches in a knee scooter.  Objective: Vital signs are stable he is alert and oriented x3. Cast is intact right lower extremity. There is some breakdown of the forefoot indicating that he is resting his foot on the ground as he standing. Radiographic evaluation does demonstrates a been in the K wire third toe right foot. K wires are placed of the second and third toes and metatarsals. Capital fragment is intact to the second met right foot.  Assessment: Status post 1 week open reduction internal fixation and cast application right lower extremity.  Plan: We discussed etiology pathology conservative versus surgical therapies. He is to keep his foot clean and dry and elevated at all times. He has not been back to work. He will followup with [place] in one week for cast removal and reapplication.

## 2013-05-13 ENCOUNTER — Other Ambulatory Visit: Payer: Self-pay | Admitting: *Deleted

## 2013-05-13 MED ORDER — INDOMETHACIN 25 MG PO CAPS: 25.0000 mg | ORAL_CAPSULE | Freq: Three times a day (TID) | ORAL | Status: DC | PRN

## 2013-05-14 ENCOUNTER — Ambulatory Visit (INDEPENDENT_AMBULATORY_CARE_PROVIDER_SITE_OTHER): Payer: 59 | Admitting: Podiatry

## 2013-05-14 ENCOUNTER — Encounter: Payer: Self-pay | Admitting: Podiatry

## 2013-05-14 VITALS — BP 130/79 | HR 88 | Temp 95.9°F | Resp 18 | Ht 73.0 in | Wt 230.0 lb

## 2013-05-14 DIAGNOSIS — S92919A Unspecified fracture of unspecified toe(s), initial encounter for closed fracture: Secondary | ICD-10-CM

## 2013-05-14 MED ORDER — OXYCODONE-ACETAMINOPHEN 10-325 MG PO TABS: ORAL_TABLET | ORAL | Status: DC

## 2013-05-14 NOTE — Progress Notes (Signed)
Mr. Saavedra presents with his wife today for his second postop visit regarding open reduction internal fixation fractured metatarsals right foot. He been ambulating with a knee scooter and crutches. He denies fever chills nausea vomiting muscle aches and pains. His wife states that he is not allowing her to help him at all. He does relate that the toes and the foot have been painful. He has been having to take his narcotics.  Objective: Vital signs are stable he is alert and oriented x3. He presents with a cast of the right lower extremity a does demonstrate some breakdown of the forefoot. Obviously he's been at the very least standing on the tip of the toes. The third toe does demonstrate advanced toe with the K wire being been obviously. At this point in time I see no signs of infection. Once the cast and dressings were removed demonstrates margins well coapted sutures are intact we went ahead and removed the sutures today. Margins remain well coapted. Moderate edema no erythema no cellulitis drainage or odor.  Assessment: Well-healing surgical foot right.  Plan: Recast him today after a dry sterile compressive dressing. I will followup with him in 2 weeks unless he needs  [place].

## 2013-05-20 ENCOUNTER — Telehealth: Payer: Self-pay | Admitting: *Deleted

## 2013-05-20 ENCOUNTER — Other Ambulatory Visit: Payer: Self-pay

## 2013-05-20 MED ORDER — LISINOPRIL 40 MG PO TABS: 40.0000 mg | ORAL_TABLET | Freq: Every day | ORAL | Status: DC

## 2013-05-20 NOTE — Telephone Encounter (Signed)
Pt called said his 2nd  Toe right foot is infected. Said toe is red, painful, was bleeding but not bleeding now. i asked him if his toe felt warm to the touch and he said he did not know. Call in antibiotic/make appt to see you???

## 2013-05-20 NOTE — Telephone Encounter (Signed)
Pt left v/m that cannot get [facility]ill [facility] l[facility] until 05/30/13. Pt is not sure why he is out of med early; pt takes 1 tab daily. Refill to [facility] until gets [facility]ill. Pt notified done.

## 2013-05-20 NOTE — Telephone Encounter (Signed)
Pt called said his 2nd Toe right foot is infected. Said toe is red, painful, was bleeding but not bleeding now. i asked him if his toe felt warm to the touch and he said he did not know. Wants an antibiotic asap

## 2013-05-21 ENCOUNTER — Encounter: Payer: Self-pay | Admitting: Podiatry

## 2013-05-21 ENCOUNTER — Ambulatory Visit (INDEPENDENT_AMBULATORY_CARE_PROVIDER_SITE_OTHER): Payer: 59 | Admitting: Podiatry

## 2013-05-21 VITALS — BP 161/82 | HR 93 | Resp 16 | Ht 73.0 in | Wt 230.0 lb

## 2013-05-21 DIAGNOSIS — T814XXA Infection following a procedure, initial encounter: Secondary | ICD-10-CM

## 2013-05-21 DIAGNOSIS — T8140XA Infection following a procedure, unspecified, initial encounter: Secondary | ICD-10-CM

## 2013-05-21 MED ORDER — CLINDAMYCIN HCL 150 MG PO CAPS
300.0000 mg | ORAL_CAPSULE | Freq: Three times a day (TID) | ORAL | Status: DC
Start: 2013-05-21 — End: 2013-08-13

## 2013-05-21 MED ORDER — AMOXICILLIN-POT CLAVULANATE 875-125 MG PO TABS: 1.0000 | ORAL_TABLET | Freq: Two times a day (BID) | ORAL | Status: DC

## 2013-05-21 NOTE — Progress Notes (Signed)
[name] presents today status post open reduction internal fixation of the right foot. He states that take to my desk Verdon Cummins of the day driving the second pin into my metatarsal even further. He states it really didn't hurt until a surgeon develop an infection. I am always concerned about MRSA. I did in one mass with a pin I was afraid I would get more of an infection.  Objective evaluation: Cast is intact right lower extremity second digit demonstrates K wire driven Alway in compressing the scan and the nail or the likely inoculating the second today with bacteria. There does appear to be erythema to the toe has decreased according to Mr. Mcclafferty since he started taking his antibiotics.  Assessment: Postop infection.  Plan: Start him on clindamycin 300 mg 3 times a day and Augmentin 875 mg 1 by mouth twice a day and followup with him in one week for cast removal.

## 2013-05-21 NOTE — Telephone Encounter (Signed)
Make sure he keeps appointment with Dr. Al Corpus this am

## 2013-05-21 NOTE — Telephone Encounter (Signed)
Due to his past history of infection I have prescribed Augmentin and clindamycin for him.  He should get started on this and see me or Dr. Bea Laura ASAP.

## 2013-05-22 ENCOUNTER — Telehealth: Payer: Self-pay | Admitting: *Deleted

## 2013-05-22 NOTE — Telephone Encounter (Signed)
Per dr Charlsie Merles call in augmentin 875mg  #20 0RF take one by mouth bid. Called into [facility]. Due to infection to pts toe

## 2013-05-29 ENCOUNTER — Other Ambulatory Visit: Payer: Self-pay

## 2013-05-29 ENCOUNTER — Encounter: Payer: Self-pay | Admitting: Podiatry

## 2013-05-29 ENCOUNTER — Ambulatory Visit (INDEPENDENT_AMBULATORY_CARE_PROVIDER_SITE_OTHER): Payer: 59 | Admitting: Podiatry

## 2013-05-29 ENCOUNTER — Ambulatory Visit (INDEPENDENT_AMBULATORY_CARE_PROVIDER_SITE_OTHER): Payer: 59

## 2013-05-29 VITALS — BP 165/100 | HR 85 | Resp 18 | Ht 73.0 in | Wt 230.0 lb

## 2013-05-29 DIAGNOSIS — Z9889 Other specified postprocedural states: Secondary | ICD-10-CM

## 2013-05-29 NOTE — Progress Notes (Signed)
[name] presents today status post open reduction internal fixation right foot. He states he slipped off of his knee scooter today slamming his foot into the floor knocking the pins better in his toes back up into his toes and causing a lot of pain and bleeding.  Objective: Vital signs are stable he is alert and oriented x3 cast was intact and once removed reveals a sterile dressing intact with pain to the second toe driven deep into the soft tissue cutting the soft tissue open allowing for bleeding. Radiographs demonstrate nonhealing osteotomies and fractures at this point. Due to the malalignment of the pins I cannot leave the pins in Norco mild pins a stay after they've been driven into the skin and bone. Pins were removed today there is minimal bleeding.  Assessment: Traumatic injury to a repair post traumatic injury.  Plan: Removal of K wires today both were removed in total. The foot was scrubbed clean there were no open lesions except for the ends of the toes. Silvadene cream was applied after supper nitrated but. And another cast was applied to blood knee. With him in one week just to make sure his toes look good.

## 2013-06-04 ENCOUNTER — Encounter: Payer: Self-pay | Admitting: Podiatry

## 2013-06-04 ENCOUNTER — Ambulatory Visit (INDEPENDENT_AMBULATORY_CARE_PROVIDER_SITE_OTHER): Payer: 59 | Admitting: Podiatry

## 2013-06-04 ENCOUNTER — Ambulatory Visit (INDEPENDENT_AMBULATORY_CARE_PROVIDER_SITE_OTHER): Payer: 59

## 2013-06-04 VITALS — BP 132/80 | HR 87 | Resp 16 | Ht 73.0 in | Wt 230.0 lb

## 2013-06-04 DIAGNOSIS — Z9889 Other specified postprocedural states: Secondary | ICD-10-CM

## 2013-06-04 MED ORDER — OXYCODONE-ACETAMINOPHEN 10-325 MG PO TABS: 1.0000 | ORAL_TABLET | Freq: Four times a day (QID) | ORAL | Status: DC | PRN

## 2013-06-04 NOTE — Progress Notes (Signed)
[name] presents today for followup of [name] fractured surgical foot right. Last time he was in he had infection to the toes oriented in injured in the K wire into the toe. He had cellulitis. At that point we put him on Augmentin and clindamycin. He syndrome out from the clindamycin. He was instructed to discontinue it and does take Benadryl. At this point he states it is doing pretty well and not a lot of pain in the foot.  Objective: Vital signs are stable he is alert and oriented x3. Radiographic evaluation demonstrates fracture toes 2 and 3 at the previously arthrodesed sites. Capital fragment to the second metatarsophalangeal joint is also not healed.  Assessment: Slowly healing fracture sites secondary to chronic trauma and diabetes.  Plan: He is to continue the use nonweightbearing cast for another week. He will bring [name] Cam Walker within the next him he comes in. We will consider removing the cast prominently next time.

## 2013-06-11 ENCOUNTER — Encounter: Payer: Self-pay | Admitting: Podiatry

## 2013-06-11 ENCOUNTER — Ambulatory Visit (INDEPENDENT_AMBULATORY_CARE_PROVIDER_SITE_OTHER): Payer: 59 | Admitting: Podiatry

## 2013-06-11 VITALS — BP 138/86 | HR 84 | Resp 16 | Ht 73.0 in | Wt 230.0 lb

## 2013-06-11 DIAGNOSIS — Z9889 Other specified postprocedural states: Secondary | ICD-10-CM

## 2013-06-11 NOTE — Progress Notes (Signed)
[name] presents today for followup of his some fractured right foot. He presents in a cast utilizing crutches. He did bring his Cam Walker with him today.  Objective: Pulses are palpable bilateral once cast was removed. Decrease in edema digits appear to be fairly rectus position considering the damage of the fractures. No cutaneous lesions noted.  Assessment: Slowly healing fracture foot right.  Plan: Cam Walker nonweightbearing status followup with him in 2 weeks for another set of x-rays

## 2013-06-23 ENCOUNTER — Other Ambulatory Visit: Payer: Self-pay | Admitting: Internal Medicine

## 2013-06-25 ENCOUNTER — Ambulatory Visit (INDEPENDENT_AMBULATORY_CARE_PROVIDER_SITE_OTHER): Payer: 59 | Admitting: Podiatry

## 2013-06-25 ENCOUNTER — Ambulatory Visit (INDEPENDENT_AMBULATORY_CARE_PROVIDER_SITE_OTHER): Payer: 59

## 2013-06-25 ENCOUNTER — Encounter: Payer: Self-pay | Admitting: Podiatry

## 2013-06-25 VITALS — BP 161/89 | HR 86 | Resp 16 | Ht 73.0 in | Wt 230.0 lb

## 2013-06-25 DIAGNOSIS — Z9889 Other specified postprocedural states: Secondary | ICD-10-CM

## 2013-06-25 NOTE — Progress Notes (Signed)
Mr. [name] presents today for followup of his fractured right foot. He states it seems to be doing fine.  Objective: Vital signs are stable he is alert and oriented x3 pulses are palpable right lower extremity. Moderate to severe edema to the right lower extremity. Good range of motion of the first metatarsophalangeal joint. The second toe appears to be slightly elevated but this is the cause of the fracture of the second metatarsal that is dorsally dislocated and displaced. His toes appear to be swollen and slightly malaligned again do to their fracture in the accident that he sustained. Radiographic evaluation demonstrates minimal healing at this point to the second metatarsal toes and fifth metatarsal.  Assessment: Nonhealing fracture second metatarsal fifth metatarsal right foot.  Plan: I would allow him to start walking on this utilizing his Cam Walker. This all-purpose for this is to hopefully stimulate bone growth rather than continue osseous atrophy. I will followup with him in one month and we will continue  Our efforts in obtaining a bone stimulator

## 2013-07-23 ENCOUNTER — Encounter: Payer: Self-pay | Admitting: Podiatry

## 2013-07-23 ENCOUNTER — Ambulatory Visit (INDEPENDENT_AMBULATORY_CARE_PROVIDER_SITE_OTHER): Payer: 59 | Admitting: Podiatry

## 2013-07-23 ENCOUNTER — Ambulatory Visit (INDEPENDENT_AMBULATORY_CARE_PROVIDER_SITE_OTHER): Payer: 59

## 2013-07-23 VITALS — BP 178/95 | HR 78 | Resp 16 | Ht 73.0 in | Wt 230.0 lb

## 2013-07-23 DIAGNOSIS — Z9889 Other specified postprocedural states: Secondary | ICD-10-CM

## 2013-07-23 NOTE — Progress Notes (Signed)
Rosanne Ashing presents today for followup of his right foot surgery. He states it is been ambulating in his Cam Walker and still has significant swelling about the right foot. He states it is been utilizing his bone stimulator overlying second metatarsal neck of the right foot. He states he has very little pain.  Objective: Vital signs are stable he is alert and oriented x3. Much decrease in edema to the right extremity. Digits are in good alignment with a fantastic range of motion of the first metatarsophalangeal joint. Prominent third metatarsal phalangeal joint more than likely associated with fracture of the second and the fifth. Radiographic evaluation demonstrates well-healed fifth metatarsal second metatarsal neck is still healing but does appear to be progressing. First metatarsal head is progressing quite nicely.  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  Plan: Continue use of the Cam Walker. Continue use of compression device. Continue use of the bone stimulator. I will followup with him in 4-6 weeks.

## 2013-08-04 NOTE — Progress Notes (Signed)
1. ORIF METATARSALS #1 #2  #3 #5

## 2013-08-13 ENCOUNTER — Ambulatory Visit (INDEPENDENT_AMBULATORY_CARE_PROVIDER_SITE_OTHER): Payer: 59

## 2013-08-13 ENCOUNTER — Ambulatory Visit (INDEPENDENT_AMBULATORY_CARE_PROVIDER_SITE_OTHER): Payer: 59 | Admitting: Podiatry

## 2013-08-13 ENCOUNTER — Ambulatory Visit (INDEPENDENT_AMBULATORY_CARE_PROVIDER_SITE_OTHER): Payer: 59 | Admitting: Internal Medicine

## 2013-08-13 ENCOUNTER — Encounter: Payer: Self-pay | Admitting: Podiatry

## 2013-08-13 ENCOUNTER — Encounter: Payer: Self-pay | Admitting: Internal Medicine

## 2013-08-13 VITALS — BP 148/80 | HR 82 | Temp 98.5°F | Ht 73.0 in | Wt 235.0 lb

## 2013-08-13 VITALS — BP 127/77 | HR 105 | Resp 16 | Ht 73.0 in | Wt 230.0 lb

## 2013-08-13 DIAGNOSIS — Z9889 Other specified postprocedural states: Secondary | ICD-10-CM

## 2013-08-13 DIAGNOSIS — Z0000 Encounter for general adult medical examination without abnormal findings: Secondary | ICD-10-CM

## 2013-08-13 DIAGNOSIS — E785 Hyperlipidemia, unspecified: Secondary | ICD-10-CM

## 2013-08-13 DIAGNOSIS — M722 Plantar fascial fibromatosis: Secondary | ICD-10-CM

## 2013-08-13 DIAGNOSIS — I10 Essential (primary) hypertension: Secondary | ICD-10-CM

## 2013-08-13 DIAGNOSIS — D369 Benign neoplasm, unspecified site: Secondary | ICD-10-CM | POA: Insufficient documentation

## 2013-08-13 DIAGNOSIS — E1149 Type 2 diabetes mellitus with other diabetic neurological complication: Secondary | ICD-10-CM

## 2013-08-13 DIAGNOSIS — M216X9 Other acquired deformities of unspecified foot: Secondary | ICD-10-CM

## 2013-08-13 DIAGNOSIS — M109 Gout, unspecified: Secondary | ICD-10-CM

## 2013-08-13 DIAGNOSIS — K219 Gastro-esophageal reflux disease without esophagitis: Secondary | ICD-10-CM

## 2013-08-13 DIAGNOSIS — Z0289 Encounter for other administrative examinations: Secondary | ICD-10-CM

## 2013-08-13 LAB — HM DIABETES FOOT EXAM

## 2013-08-13 MED ORDER — SIMVASTATIN 40 MG PO TABS: 40.0000 mg | ORAL_TABLET | Freq: Every day | ORAL | Status: DC

## 2013-08-13 MED ORDER — INDOMETHACIN 25 MG PO CAPS: 25.0000 mg | ORAL_CAPSULE | Freq: Three times a day (TID) | ORAL | Status: DC | PRN

## 2013-08-13 MED ORDER — OMEPRAZOLE 20 MG PO CPDR: 20.0000 mg | DELAYED_RELEASE_CAPSULE | Freq: Two times a day (BID) | ORAL | Status: DC

## 2013-08-13 MED ORDER — INSULIN PEN NEEDLE 31G X 6 MM MISC: 15.0000 [IU] | Freq: Every day | Status: DC

## 2013-08-13 MED ORDER — LISINOPRIL 40 MG PO TABS: 40.0000 mg | ORAL_TABLET | Freq: Every day | ORAL | Status: DC

## 2013-08-13 MED ORDER — GLUCOSE BLOOD VI STRP: ORAL_STRIP | Status: DC

## 2013-08-13 MED ORDER — BD SWAB SINGLE USE REGULAR PADS: 1.0000 | MEDICATED_PAD | Freq: Two times a day (BID) | Status: DC

## 2013-08-13 MED ORDER — INSULIN GLARGINE 100 UNIT/ML SOLOSTAR PEN: 15.0000 [IU] | PEN_INJECTOR | Freq: Every day | SUBCUTANEOUS | Status: DC

## 2013-08-13 MED ORDER — TRIAMCINOLONE ACETONIDE 0.1 % EX LOTN: 1.0000 "application " | TOPICAL_LOTION | Freq: Three times a day (TID) | CUTANEOUS | Status: DC

## 2013-08-13 MED ORDER — HYDROCORTISONE VALERATE 0.2 % EX OINT: 1.0000 "application " | TOPICAL_OINTMENT | Freq: Two times a day (BID) | CUTANEOUS | Status: DC

## 2013-08-13 MED ORDER — METFORMIN HCL 1000 MG PO TABS: 1000.0000 mg | ORAL_TABLET | Freq: Two times a day (BID) | ORAL | Status: DC

## 2013-08-13 NOTE — Assessment & Plan Note (Signed)
No problems with med.

## 2013-08-13 NOTE — Assessment & Plan Note (Signed)
Hopefully still acceptable control

## 2013-08-13 NOTE — Assessment & Plan Note (Signed)
He is due for colon--he will call for follow up Will check PSA after discussion

## 2013-08-13 NOTE — Progress Notes (Signed)
Keldon presents today for followup of his nonunion second metatarsal of his right foot. He states that he is having pain beneath the third metatarsal head of the right foot.  Objective: Vital signs are stable he is alert and oriented x3 much decrease in edema to the right foot. Painful plantar flexed third metatarsal right foot. With a history of fasciitis.  Assessment: Plantar flexed third metatarsal right foot slowly healing nonunion with the use of the bone stimulator right foot.  Plan: I had him scan to today for a pair orthotics. He will continue use of his bone stimulator. I am going to allow him to slowly get back to a regular pair shoes. Starting first with his Darco shoe progressing to Crox and then tennis shoes. I will followup with him with his orthotics committed to

## 2013-08-13 NOTE — Assessment & Plan Note (Signed)
Rare bouts--indocin resolves

## 2013-08-13 NOTE — Addendum Note (Signed)
Addended by: Despina Hidden on: 08/13/2013 09:46 AM   Modules accepted: Orders

## 2013-08-13 NOTE — Progress Notes (Signed)
Subjective:    Patient ID: [name], male    DOB: [birth year], 58 y.o.   MRN: [id number]  HPI Here for physical  Had 2 foot surgeries--after August procedure got fractures and screws backed out Then another procedure in October Bones not healing--using stimulator Had to have some necrotic bone removed---not really happy with results Still in CAM boot Not able to exercise  Sugars have been up some May be as high as 140-150--- but usually lower Doing better lately No serious low sugar reactions  Tolerated increased lisinopril fine BP up at podiatrists No dizziness, chest pain, SOB (though has DOE easier), syncope  Has some breakthrough on acid symptoms Will take 2nd omeprazole at times  Current Outpatient Prescriptions on File Prior to Visit  Medication Sig Dispense Refill  . Alcohol Swabs (B-D SINGLE USE SWABS REGULAR) PADS Use as directed two times  daily  200 each  0  . aspirin 81 MG tablet Take 81 mg by mouth daily.        . Calcipotriene (DOVONEX) 0.005 % solution Apply 1 application topically 2 (two) times daily.  60 mL  3  . calcium gluconate 500 MG tablet Take 500 mg by mouth daily.        . Cinnamon 500 MG capsule Take 500 mg by mouth 2 (two) times daily.        . hydrocortisone valerate ointment (WEST-CORT) 0.2 % Apply 1 application topically 2 (two) times daily.  60 g  3  . indomethacin (INDOCIN) 25 MG capsule Take 1 capsule (25 mg total) by mouth 3 (three) times daily as needed.  90 capsule  3  . Insulin Pen Needle 31G X 6 MM MISC Inject 15 Units as directed daily.  200 each  3  . LANTUS SOLOSTAR 100 UNIT/ML SOPN Inject 15 units  subcutaneously at bedtime  15 pen  0  . lisinopril (PRINIVIL,ZESTRIL) 40 MG tablet Take 1 tablet (40 mg total) by mouth daily.  30 tablet  0  . metFORMIN (GLUCOPHAGE) 1000 MG tablet Take 1 tablet (1,000 mg total) by mouth 2 (two) times daily with a meal.  180 tablet  3  . Multiple Vitamin (MULTIVITAMIN) capsule Take 1 capsule by mouth  daily.        Marland Kitchen omeprazole (PRILOSEC) 20 MG capsule Take 1 capsule (20 mg total) by mouth daily.  90 capsule  3  . ONE TOUCH ULTRA TEST test strip Use 1 strip two times daily as instructed  200 each  0  . simvastatin (ZOCOR) 40 MG tablet Take 1 tablet (40 mg total) by mouth at bedtime.  90 tablet  3   No current facility-administered medications on file prior to visit.    Allergies  Allergen Reactions  . Glipizide     REACTION: wt. gain, hands tingling  . Ibuprofen     REACTION: unspecified  . Ramipril     REACTION: cough    Past Medical History  Diagnosis Date  . Diabetes mellitus   . GERD (gastroesophageal reflux disease)   . Gout   . Hyperlipidemia   . Hypertension   . Arthritis   . Psoriasis   . Hammertoe   . Bunion 04/02/2013    STATUS POST OP BUN REPAIR  . Plantar flexed metatarsal     Past Surgical History  Procedure Laterality Date  . Hip fracture surgery  1993    surgery x2  . Total hip arthroplasty      Left hip  replacement/femur fx 07/04, Screw removal left hip- [facility] 11/01  . Foot surgery Right 08.08.14    BUNIION HAM TOE2,3 PINS ,2ND MET OSTEOTOMY, 5TH MET W/SCREW    Family History  Problem Relation Age of Onset  . Diabetes Mother   . Cancer Neg Hx     History   Social History  . Marital Status: Married    Spouse Name: N/A    Number of Children: 3  . Years of Education: N/A   Occupational History  . PC ADMINISTRATOR    Social History Main Topics  . Smoking status: Never Smoker   . Smokeless tobacco: Never Used  . Alcohol Use: Yes     Comment: occasional (DWI 1991)  . Drug Use: No  . Sexual Activity: Not on file   Other Topics Concern  . Not on file   Social History Narrative   [name] work on the side and has [place].5 acres   Review of Systems  Constitutional: Positive for unexpected weight change. Negative for fatigue.       Wears seat belt  HENT: Positive for tinnitus. Negative for dental problem and hearing loss.        Regular  with dentist  Eyes: Negative for visual disturbance.       Recent exam  Respiratory: Negative for cough, chest tightness and shortness of breath.   Cardiovascular: Negative for chest pain, palpitations and leg swelling.  Gastrointestinal: Negative for nausea, vomiting, abdominal pain, constipation and blood in stool.       Still with the heartburn--will increase the med  Endocrine: Negative for cold intolerance and heat intolerance.  Genitourinary: Positive for difficulty urinating.       Slightly slow stream and dribbling. Very mild No sig ED  Musculoskeletal: Positive for arthralgias. Negative for back pain and joint swelling.       Gout in right knee--indomethacin helps  Skin: Positive for rash.       No suspicious lesions Satisfied with psoriasis Rx  Allergic/Immunologic: Positive for environmental allergies. Negative for immunocompromised state.  Neurological: Negative for dizziness, syncope, weakness, light-headedness, numbness and headaches.  Hematological: Negative for adenopathy. Bruises/bleeds easily.  Psychiatric/Behavioral: Negative for sleep disturbance and dysphoric mood. The patient is not nervous/anxious.        Objective:   Physical Exam  Constitutional: He is oriented to person, place, and time. He appears well-developed and well-nourished. No distress.  HENT:  Head: Normocephalic and atraumatic.  Right Ear: External ear normal.  Left Ear: External ear normal.  Mouth/Throat: Oropharynx is clear and moist. No oropharyngeal exudate.  Eyes: Conjunctivae and EOM are normal. Pupils are equal, round, and reactive to light.  Neck: Normal range of motion. Neck supple. No thyromegaly present.  Cardiovascular: Normal rate, regular rhythm, normal heart sounds and intact distal pulses.  Exam reveals no gallop.   No murmur heard. Pulmonary/Chest: Effort normal and breath sounds normal. No respiratory distress. He has no wheezes. He has no rales.  Abdominal: Soft. There is no  tenderness.  Musculoskeletal: He exhibits no edema and no tenderness.  Mild swelling in right foot No open foot areas  Lymphadenopathy:    He has no cervical adenopathy.  Neurological: He is alert and oriented to person, place, and time.  Slight sensory changes in right foot  Skin: Skin is warm. No erythema.  Psychiatric: He has a normal mood and affect. His behavior is normal.          Assessment & Plan:

## 2013-08-13 NOTE — Patient Instructions (Addendum)
Please call [facility] clinic gastroenterology to set up your follow up colonoscopy.  Diabetes Meal Planning Guide The diabetes meal planning guide is a tool to help you plan your meals and snacks. It is important for people with diabetes to manage their blood glucose (sugar) levels. Choosing the right foods and the right amounts throughout your day will help control your blood glucose. Eating right can even help you improve your blood pressure and reach or maintain a healthy weight. CARBOHYDRATE COUNTING MADE EASY When you eat carbohydrates, they turn to sugar. This raises your blood glucose level. Counting carbohydrates can help you control this level so you feel better. When you plan your meals by counting carbohydrates, you can have more flexibility in what you eat and balance your medicine with your food intake. Carbohydrate counting simply means adding up the total amount of carbohydrate grams in your meals and snacks. Try to eat about the same amount at each meal. Foods with carbohydrates are listed below. Each portion below is 1 carbohydrate serving or 15 grams of carbohydrates. Ask your dietician how many grams of carbohydrates you should eat at each meal or snack. Grains and Starches  1 slice bread.   English muffin or hotdog/hamburger bun.   cup cold cereal (unsweetened).   cup cooked pasta or rice.   cup starchy vegetables (corn, potatoes, peas, beans, winter squash).  1 tortilla (6 inches).   bagel.  1 waffle or pancake (size of a CD).   cup cooked cereal.  4 to 6 small crackers. *Whole grain is recommended. Fruit  1 cup fresh unsweetened berries, melon, papaya, pineapple.  1 small fresh fruit.   banana or mango.   cup fruit juice (4 oz unsweetened).   cup canned fruit in natural juice or water.  2 tbs dried fruit.  12 to 15 grapes or cherries. Milk and Yogurt  1 cup fat-free or 1% milk.  1 cup soy milk.  6 oz light yogurt with sugar-free  sweetener.  6 oz low-fat soy yogurt.  6 oz plain yogurt. Vegetables  1 cup raw or  cup cooked is counted as 0 carbohydrates or a "free" food.  If you eat 3 or more servings at 1 meal, count them as 1 carbohydrate serving. Other Carbohydrates   oz chips or pretzels.   cup ice cream or frozen yogurt.   cup sherbet or sorbet.  2 inch square cake, no frosting.  1 tbs honey, sugar, jam, jelly, or syrup.  2 small cookies.  3 squares of graham crackers.  3 cups popcorn.  6 crackers.  1 cup broth-based soup.  Count 1 cup casserole or other mixed foods as 2 carbohydrate servings.  Foods with less than 20 calories in a serving may be counted as 0 carbohydrates or a "free" food. You may want to purchase a book or computer software that lists the carbohydrate gram counts of different foods. In addition, the nutrition facts panel on the labels of the foods you eat are a good source of this information. The label will tell you how big the serving size is and the total number of carbohydrate grams you will be eating per serving. Divide this number by 15 to obtain the number of carbohydrate servings in a portion. Remember, 1 carbohydrate serving equals 15 grams of carbohydrate. SERVING SIZES Measuring foods and serving sizes helps you make sure you are getting the right amount of food. The list below tells how big or small some common serving sizes are.  1 oz.........4 stacked dice.  3 oz........Marland KitchenDeck of cards.  1 tsp.......Marland KitchenTip of little finger.  1 tbs......Marland KitchenMarland KitchenThumb.  2 tbs.......Marland KitchenGolf ball.   cup......Marland KitchenHalf of a fist.  1 cup.......Marland KitchenA fist. SAMPLE DIABETES MEAL PLAN Below is a sample meal plan that includes foods from the grain and starches, dairy, vegetable, fruit, and meat groups. A dietician can individualize a meal plan to fit your calorie needs and tell you the number of servings needed from each food group. However, controlling the total amount of carbohydrates in your  meal or snack is more important than making sure you include all of the food groups at every meal. You may interchange carbohydrate containing foods (dairy, starches, and fruits). The meal plan below is an example of a 2000 calorie diet using carbohydrate counting. This meal plan has 17 carbohydrate servings. Breakfast  1 cup oatmeal (2 carb servings).   cup light yogurt (1 carb serving).  1 cup blueberries (1 carb serving).   cup almonds. Snack  1 large apple (2 carb servings).  1 low-fat string cheese stick. Lunch  Chicken breast salad.  1 cup spinach.   cup chopped tomatoes.  2 oz chicken breast, sliced.  2 tbs low-fat [place] dressing.  12 whole-wheat crackers (2 carb servings).  12 to 15 grapes (1 carb serving).  1 cup low-fat milk (1 carb serving). Snack  1 cup carrots.   cup hummus (1 carb serving). Dinner  3 oz broiled salmon.  1 cup brown rice (3 carb servings). Snack  1  cups steamed broccoli (1 carb serving) drizzled with 1 tsp olive oil and lemon juice.  1 cup light pudding (2 carb servings). DIABETES MEAL PLANNING WORKSHEET Your dietician can use this worksheet to help you decide how many servings of foods and what types of foods are right for you.  BREAKFAST Food Group and Servings / Carb Servings Grain/Starches __________________________________ Dairy __________________________________________ Vegetable ______________________________________ Fruit ___________________________________________ Meat __________________________________________ Fat ____________________________________________ LUNCH Food Group and Servings / Carb Servings Grain/Starches ___________________________________ Dairy ___________________________________________ Fruit ____________________________________________ Meat ___________________________________________ Fat _____________________________________________ [name] Food Group and Servings / Carb  Servings Grain/Starches ___________________________________ Dairy ___________________________________________ Fruit ____________________________________________ Meat ___________________________________________ Fat _____________________________________________ SNACKS Food Group and Servings / Carb Servings Grain/Starches ___________________________________ Dairy ___________________________________________ Vegetable _______________________________________ Fruit ____________________________________________ Meat ___________________________________________ Fat _____________________________________________ DAILY TOTALS Starches _________________________ Vegetable ________________________ Fruit ____________________________ Dairy ____________________________ Meat ____________________________ Fat ______________________________ Document Released: 04/06/2005 Document Revised: 10/02/2011 Document Reviewed: 02/15/2009 [employer] Patient Information 2014 [employer]

## 2013-08-13 NOTE — Assessment & Plan Note (Signed)
BP Readings from Last 3 Encounters:  08/13/13 148/80  07/23/13 178/95  06/25/13 161/89   Better now Will increase meds next time if still over 140

## 2013-08-13 NOTE — Assessment & Plan Note (Signed)
Increase in PPI due to breakthrough symptoms

## 2013-08-14 LAB — COMPREHENSIVE METABOLIC PANEL
A/G RATIO: 2.3 (ref 1.1–2.5)
ALT: 25 IU/L (ref 0–44)
AST: 25 IU/L (ref 0–40)
Albumin: 5 g/dL (ref 3.5–5.5)
Alkaline Phosphatase: 70 IU/L (ref 39–117)
BUN/Creatinine Ratio: 11 (ref 9–20)
BUN: 11 mg/dL (ref 6–24)
CHLORIDE: 94 mmol/L — AB (ref 97–108)
CO2: 22 mmol/L (ref 18–29)
Calcium: 9.8 mg/dL (ref 8.7–10.2)
Creatinine, Ser: 0.98 mg/dL (ref 0.76–1.27)
GFR, EST AFRICAN AMERICAN: 99 mL/min/{1.73_m2} (ref >59)
GFR, EST NON AFRICAN AMERICAN: 85 mL/min/{1.73_m2} (ref >59)
Globulin, Total: 2.2 g/dL (ref 1.5–4.5)
Glucose: 136 mg/dL — ABNORMAL HIGH (ref 65–99)
POTASSIUM: 5.6 mmol/L — AB (ref 3.5–5.2)
SODIUM: 134 mmol/L (ref 134–144)
Total Bilirubin: 0.7 mg/dL (ref 0.0–1.2)
Total Protein: 7.2 g/dL (ref 6.0–8.5)

## 2013-08-14 LAB — LIPID PANEL
Chol/HDL Ratio: 2.3 ratio units (ref 0.0–5.0)
Cholesterol, Total: 141 mg/dL (ref 100–199)
HDL: 61 mg/dL (ref >39)
LDL Calculated: 66 mg/dL (ref 0–99)
Triglycerides: 69 mg/dL (ref 0–149)
VLDL CHOLESTEROL CAL: 14 mg/dL (ref 5–40)

## 2013-08-14 LAB — CBC WITH DIFFERENTIAL/PLATELET
BASOS ABS: 0 10*3/uL (ref 0.0–0.2)
Basos: 1 %
Eos: 5 %
Eosinophils Absolute: 0.2 10*3/uL (ref 0.0–0.4)
HCT: 41.4 % (ref 37.5–51.0)
Hemoglobin: 14.2 g/dL (ref 12.6–17.7)
IMMATURE GRANS (ABS): 0 10*3/uL (ref 0.0–0.1)
IMMATURE GRANULOCYTES: 0 %
LYMPHS ABS: 1.7 10*3/uL (ref 0.7–3.1)
Lymphs: 32 %
MCH: 30.5 pg (ref 26.6–33.0)
MCHC: 34.3 g/dL (ref 31.5–35.7)
MCV: 89 fL (ref 79–97)
Monocytes Absolute: 0.5 10*3/uL (ref 0.1–0.9)
Monocytes: 9 %
NEUTROS PCT: 53 %
Neutrophils Absolute: 2.9 10*3/uL (ref 1.4–7.0)
RBC: 4.66 x10E6/uL (ref 4.14–5.80)
RDW: 13.3 % (ref 12.3–15.4)
WBC: 5.4 10*3/uL (ref 3.4–10.8)

## 2013-08-14 LAB — URIC ACID: Uric Acid: 6.2 mg/dL (ref 3.7–8.6)

## 2013-08-14 LAB — T4, FREE: Free T4: 1.36 ng/dL (ref 0.82–1.77)

## 2013-08-14 LAB — TSH: TSH: 1.89 u[IU]/mL (ref 0.450–4.500)

## 2013-08-14 LAB — HEMOGLOBIN A1C
ESTIMATED AVERAGE GLUCOSE: 189 mg/dL
HEMOGLOBIN A1C: 8.2 % — AB (ref 4.8–5.6)

## 2013-08-14 LAB — PSA: PSA: 3.3 ng/mL (ref 0.0–4.0)

## 2013-08-18 ENCOUNTER — Encounter: Payer: Self-pay | Admitting: Internal Medicine

## 2013-08-21 ENCOUNTER — Encounter: Payer: Self-pay | Admitting: Podiatry

## 2013-08-21 NOTE — Progress Notes (Signed)
Sent pt postcard letting him know orthotics are in.

## 2013-09-25 ENCOUNTER — Encounter: Payer: Self-pay | Admitting: Podiatry

## 2013-09-25 ENCOUNTER — Ambulatory Visit (INDEPENDENT_AMBULATORY_CARE_PROVIDER_SITE_OTHER): Payer: 59 | Admitting: Podiatry

## 2013-09-25 VITALS — BP 147/100 | HR 100 | Resp 16 | Wt 235.0 lb

## 2013-09-25 DIAGNOSIS — Z9889 Other specified postprocedural states: Secondary | ICD-10-CM

## 2013-09-25 NOTE — Progress Notes (Signed)
[name] presents today to pick up his orthotics. We tried been in his tennis shoes and he related that felt very good at that point. He states he seems to be doing better all the time status post forefoot reconstruction right. I will followup with him in one month.

## 2013-10-01 ENCOUNTER — Encounter: Payer: Self-pay | Admitting: Internal Medicine

## 2013-10-06 ENCOUNTER — Telehealth: Payer: Self-pay | Admitting: Internal Medicine

## 2013-10-06 NOTE — Telephone Encounter (Signed)
Patient advised.

## 2013-10-06 NOTE — Telephone Encounter (Signed)
Please let him know that I generally don't check the urine microalbumin on any diabetics who are already on an ACE inhibitor (like his lisinopril) or ARB---since this is the treatment if there was protein in the urine We can discuss this at his next appt--but it really isn't necessary.

## 2013-10-06 NOTE — Telephone Encounter (Signed)
Patient sent a message through my chart asking to schedule an appointment with Dr.Letvak to check his urine microalbumin.  I scheduled an appointment with Dr.Letvak on 09/09/13.  Patient sent back that he had a work conflict with that appointment and wanted to know if a lab order could be faxed to him at [phone] to his attention.  Please advise.

## 2013-10-07 ENCOUNTER — Ambulatory Visit: Payer: 59 | Admitting: Internal Medicine

## 2013-10-23 ENCOUNTER — Ambulatory Visit: Payer: 59 | Admitting: Podiatry

## 2013-10-30 ENCOUNTER — Other Ambulatory Visit: Payer: Self-pay

## 2013-11-17 DIAGNOSIS — M79609 Pain in unspecified limb: Secondary | ICD-10-CM

## 2013-12-21 ENCOUNTER — Other Ambulatory Visit: Payer: Self-pay | Admitting: Internal Medicine

## 2014-02-10 ENCOUNTER — Encounter: Payer: Self-pay | Admitting: Internal Medicine

## 2014-02-10 ENCOUNTER — Ambulatory Visit (INDEPENDENT_AMBULATORY_CARE_PROVIDER_SITE_OTHER): Payer: 59 | Admitting: Internal Medicine

## 2014-02-10 VITALS — BP 158/80 | HR 91 | Temp 97.7°F | Wt 230.0 lb

## 2014-02-10 DIAGNOSIS — S92901S Unspecified fracture of right foot, sequela: Secondary | ICD-10-CM

## 2014-02-10 DIAGNOSIS — E1149 Type 2 diabetes mellitus with other diabetic neurological complication: Secondary | ICD-10-CM

## 2014-02-10 DIAGNOSIS — S8290XS Unspecified fracture of unspecified lower leg, sequela: Secondary | ICD-10-CM

## 2014-02-10 DIAGNOSIS — I10 Essential (primary) hypertension: Secondary | ICD-10-CM

## 2014-02-10 DIAGNOSIS — E785 Hyperlipidemia, unspecified: Secondary | ICD-10-CM

## 2014-02-10 MED ORDER — AMLODIPINE BESYLATE 5 MG PO TABS: 5.0000 mg | ORAL_TABLET | Freq: Every day | ORAL | Status: DC

## 2014-02-10 NOTE — Assessment & Plan Note (Signed)
Hopefully better Will plan to add invokana if still over 8%

## 2014-02-10 NOTE — Assessment & Plan Note (Signed)
Requiring 2nd surgery which was not successful Ongoing pain

## 2014-02-10 NOTE — Progress Notes (Signed)
Subjective:    Patient ID: [name], male    DOB: [birth year], 58 y.o.   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 Thinking of getting another opinion Pain is still better than before the surgery Orthotics made his feet hurt more--gave up after 2 months Some numbness still  Has been able to do some exercise---limited till foot hurts Able to do yard work again  Checking sugars every other day Up and down--- fasting 97-155 No hypoglycemic reactions Due for eye exam now--going soon  Occasionally checks BP at pharmacy-- 132/78 recently. Up more at times Tries to limit indomethacin--last time was last week No chest pain No SOB No edema in legs---just foot swelling No dizziness or syncope  Current Outpatient Prescriptions on File Prior to Visit  Medication Sig Dispense Refill  . Alcohol Swabs (B-D SINGLE USE SWABS REGULAR) PADS Apply 1 each topically 2 (two) times daily.  200 each  3  . aspirin 81 MG tablet Take 81 mg by mouth daily.        . calcium gluconate 500 MG tablet Take 500 mg by mouth daily.        . Cinnamon 500 MG capsule Take 500 mg by mouth 2 (two) times daily.        Marland Kitchen glucose blood (ONE TOUCH ULTRA TEST) test strip Use as instructed to test blood sugar two times daily dx: 250.60  200 each  3  . hydrocortisone valerate ointment (WEST-CORT) 0.2 % Apply 1 application topically 2 (two) times daily.  60 g  3  . indomethacin (INDOCIN) 25 MG capsule Take 1 capsule by mouth 3  times a day as needed  90 capsule  3  . Insulin Glargine (LANTUS SOLOSTAR) 100 UNIT/ML Solostar Pen Inject 15 Units into the skin at bedtime. Dx: 250.60  5 pen  3  . Insulin Pen Needle 31G X 6 MM MISC Inject 15 Units as directed daily. Dx: 250.60  200 each  3  . lisinopril (PRINIVIL,ZESTRIL) 40 MG tablet Take 1 tablet  (40 mg total) by mouth daily.  90 tablet  3  . metFORMIN (GLUCOPHAGE) 1000 MG tablet Take 1 tablet (1,000 mg total) by mouth 2 (two) times daily with a meal.  180 tablet  3  . Multiple Vitamin (MULTIVITAMIN) capsule Take 1 capsule by mouth daily.        Marland Kitchen omeprazole (PRILOSEC) 20 MG capsule Take 1 capsule (20 mg total) by mouth 2 (two) times daily before a meal.  180 capsule  3  . simvastatin (ZOCOR) 40 MG tablet Take 1 tablet (40 mg total) by mouth at bedtime.  90 tablet  3  . triamcinolone lotion (KENALOG) 0.1 % Apply 1 application topically 3 (three) times daily.  60 mL  3   No current facility-administered medications on file prior to visit.    Allergies  Allergen Reactions  . Glipizide     REACTION: wt. gain, hands tingling  . Ibuprofen     REACTION: unspecified  . Ramipril     REACTION: cough    Past Medical History  Diagnosis Date  . Diabetes mellitus   . GERD (gastroesophageal reflux disease)   . Gout   . Hyperlipidemia   . Hypertension   . Arthritis   . Psoriasis   .  Hammertoe   . Bunion 04/02/2013    STATUS POST OP BUN REPAIR  . Plantar flexed metatarsal   . Adenomatous polyp     Past Surgical History  Procedure Laterality Date  . Hip fracture surgery  1993    surgery x2  . Total hip arthroplasty      Left hip replacement/femur fx 07/04, Screw removal left hip- [facility] 11/01  . Foot surgery Right 08.08.14    BUNIION HAM TOE2,3 PINS ,2ND MET OSTEOTOMY, 5TH MET W/SCREW    Family History  Problem Relation Age of Onset  . Diabetes Mother   . Cancer Neg Hx     History   Social History  . Marital Status: Married    Spouse Name: N/A    Number of Children: 3  . Years of Education: N/A   Occupational History  . PC ADMINISTRATOR    Social History Main Topics  . Smoking status: Never Smoker   . Smokeless tobacco: Never Used  . Alcohol Use: Yes     Comment: occasional (DWI 1991)  . Drug Use: No  . Sexual Activity: Not on file   Other Topics Concern    . Not on file   Social History Narrative   [name] work on the side and has 2.5 acres   Review of Systems Weight stable from 6 months ago Appetite is fine Sleeps well    Objective:   Physical Exam  Constitutional: He appears well-developed and well-nourished. No distress.  Neck: Normal range of motion. Neck supple. No thyromegaly present.  Cardiovascular: Normal rate, regular rhythm, normal heart sounds and intact distal pulses.  Exam reveals no gallop.   No murmur heard. Pulmonary/Chest: Effort normal and breath sounds normal. No respiratory distress. He has no wheezes. He has no rales.  Musculoskeletal: He exhibits no edema and no tenderness.  Lymphadenopathy:    He has no cervical adenopathy.  Psychiatric: He has a normal mood and affect. His behavior is normal.          Assessment & Plan:

## 2014-02-10 NOTE — Assessment & Plan Note (Signed)
BP Readings from Last 3 Encounters:  02/10/14 158/80  09/25/13 147/100  08/13/13 127/77   Persistently high lately He gets over 150 when he checks at times Will add amlodipine

## 2014-02-10 NOTE — Assessment & Plan Note (Signed)
Good control

## 2014-02-11 ENCOUNTER — Telehealth: Payer: Self-pay | Admitting: Internal Medicine

## 2014-02-11 LAB — RENAL FUNCTION PANEL
ALBUMIN: 4.7 g/dL (ref 3.5–5.5)
BUN/Creatinine Ratio: 9 (ref 9–20)
BUN: 8 mg/dL (ref 6–24)
CALCIUM: 9.7 mg/dL (ref 8.7–10.2)
CO2: 24 mmol/L (ref 18–29)
CREATININE: 0.94 mg/dL (ref 0.76–1.27)
Chloride: 93 mmol/L — ABNORMAL LOW (ref 97–108)
GFR calc Af Amer: 103 mL/min/{1.73_m2} (ref >59)
GFR calc non Af Amer: 89 mL/min/{1.73_m2} (ref >59)
Glucose: 100 mg/dL — ABNORMAL HIGH (ref 65–99)
Phosphorus: 3.8 mg/dL (ref 2.5–4.5)
Potassium: 5.8 mmol/L — ABNORMAL HIGH (ref 3.5–5.2)
SODIUM: 133 mmol/L — AB (ref 134–144)

## 2014-02-11 LAB — HEMOGLOBIN A1C
Est. average glucose Bld gHb Est-mCnc: 157 mg/dL
Hgb A1c MFr Bld: 7.1 % — ABNORMAL HIGH (ref 4.8–5.6)

## 2014-02-11 NOTE — Telephone Encounter (Signed)
Relevant patient education assigned to patient using Emmi. ° °

## 2014-02-17 ENCOUNTER — Other Ambulatory Visit: Payer: Self-pay | Admitting: Internal Medicine

## 2014-02-17 DIAGNOSIS — E875 Hyperkalemia: Secondary | ICD-10-CM

## 2014-02-18 LAB — HM DIABETES EYE EXAM

## 2014-02-23 ENCOUNTER — Other Ambulatory Visit (INDEPENDENT_AMBULATORY_CARE_PROVIDER_SITE_OTHER): Payer: 59

## 2014-02-23 DIAGNOSIS — E875 Hyperkalemia: Secondary | ICD-10-CM

## 2014-02-24 LAB — RENAL FUNCTION PANEL
Albumin: 5 g/dL (ref 3.5–5.5)
BUN/Creatinine Ratio: 13 (ref 9–20)
BUN: 12 mg/dL (ref 6–24)
CHLORIDE: 92 mmol/L — AB (ref 97–108)
CO2: 23 mmol/L (ref 18–29)
Calcium: 10 mg/dL (ref 8.7–10.2)
Creatinine, Ser: 0.96 mg/dL (ref 0.76–1.27)
GFR calc Af Amer: 100 mL/min/{1.73_m2} (ref >59)
GFR, EST NON AFRICAN AMERICAN: 87 mL/min/{1.73_m2} (ref >59)
Glucose: 258 mg/dL — ABNORMAL HIGH (ref 65–99)
PHOSPHORUS: 2.8 mg/dL (ref 2.5–4.5)
POTASSIUM: 5.9 mmol/L — AB (ref 3.5–5.2)
SODIUM: 132 mmol/L — AB (ref 134–144)

## 2014-02-25 ENCOUNTER — Encounter: Payer: Self-pay | Admitting: Internal Medicine

## 2014-02-25 MED ORDER — LISINOPRIL 20 MG PO TABS: 20.0000 mg | ORAL_TABLET | Freq: Every day | ORAL | Status: DC

## 2014-02-25 MED ORDER — AMLODIPINE BESYLATE 10 MG PO TABS: 10.0000 mg | ORAL_TABLET | Freq: Every day | ORAL | Status: DC

## 2014-02-25 NOTE — Telephone Encounter (Signed)
Spoke with patient and advised results rx sent to pharmacy by e-script Lab appt made

## 2014-03-04 ENCOUNTER — Other Ambulatory Visit: Payer: Self-pay | Admitting: Internal Medicine

## 2014-03-04 DIAGNOSIS — E875 Hyperkalemia: Secondary | ICD-10-CM

## 2014-03-09 ENCOUNTER — Other Ambulatory Visit (INDEPENDENT_AMBULATORY_CARE_PROVIDER_SITE_OTHER): Payer: 59

## 2014-03-09 DIAGNOSIS — E875 Hyperkalemia: Secondary | ICD-10-CM

## 2014-03-10 ENCOUNTER — Other Ambulatory Visit: Payer: Self-pay | Admitting: Internal Medicine

## 2014-03-10 DIAGNOSIS — E875 Hyperkalemia: Secondary | ICD-10-CM

## 2014-03-10 LAB — RENAL FUNCTION PANEL
ALBUMIN: 4.9 g/dL (ref 3.5–5.5)
BUN/Creatinine Ratio: 11 (ref 9–20)
BUN: 11 mg/dL (ref 6–24)
CO2: 23 mmol/L (ref 18–29)
Calcium: 9.9 mg/dL (ref 8.7–10.2)
Chloride: 91 mmol/L — ABNORMAL LOW (ref 97–108)
Creatinine, Ser: 1 mg/dL (ref 0.76–1.27)
GFR calc non Af Amer: 83 mL/min/{1.73_m2} (ref >59)
GFR, EST AFRICAN AMERICAN: 95 mL/min/{1.73_m2} (ref >59)
Glucose: 208 mg/dL — ABNORMAL HIGH (ref 65–99)
POTASSIUM: 5.8 mmol/L — AB (ref 3.5–5.2)
Phosphorus: 3.3 mg/dL (ref 2.5–4.5)
SODIUM: 131 mmol/L — AB (ref 134–144)

## 2014-03-19 ENCOUNTER — Encounter: Payer: Self-pay | Admitting: Internal Medicine

## 2014-04-15 ENCOUNTER — Encounter: Payer: Self-pay | Admitting: Internal Medicine

## 2014-04-15 ENCOUNTER — Ambulatory Visit (INDEPENDENT_AMBULATORY_CARE_PROVIDER_SITE_OTHER): Payer: 59 | Admitting: Internal Medicine

## 2014-04-15 VITALS — BP 145/80 | HR 94 | Temp 97.5°F | Ht 73.0 in | Wt 224.0 lb

## 2014-04-15 DIAGNOSIS — I10 Essential (primary) hypertension: Secondary | ICD-10-CM

## 2014-04-15 DIAGNOSIS — E875 Hyperkalemia: Secondary | ICD-10-CM

## 2014-04-15 NOTE — Progress Notes (Signed)
Pre visit review using our clinic review tool, if applicable. No additional management support is needed unless otherwise documented below in the visit note. 

## 2014-04-15 NOTE — Assessment & Plan Note (Signed)
BP Readings from Last 3 Encounters:  04/15/14 145/80  02/10/14 158/80  09/25/13 147/100   Was fine at nephrologist and outpatient Will hold off on changes now

## 2014-04-15 NOTE — Progress Notes (Signed)
Subjective:    Patient ID: [name], male    DOB: 02-05-1956, 58 y.o.   MRN: [id number]  HPI Did go to nephrologist Potassium is back to normal after lisinopril No problems with renal function Has cost him a lot of money so doesn't want to go back  Feels fine Has lost a few pounds  Has checked his BP Was 138/78 at Dr Juleen China 140/80 at [facility]  Current Outpatient Prescriptions on File Prior to Visit  Medication Sig Dispense Refill  . Alcohol Swabs (B-D SINGLE USE SWABS REGULAR) PADS Apply 1 each topically 2 (two) times daily.  200 each  3  . amLODipine (NORVASC) 10 MG tablet Take 1 tablet (10 mg total) by mouth daily.  90 tablet  3  . aspirin 81 MG tablet Take 81 mg by mouth daily.        . calcium gluconate 500 MG tablet Take 500 mg by mouth daily.        . Cinnamon 500 MG capsule Take 500 mg by mouth 2 (two) times daily.        Marland Kitchen glucose blood (ONE TOUCH ULTRA TEST) test strip Use as instructed to test blood sugar two times daily dx: 250.60  200 each  3  . hydrocortisone valerate ointment (WEST-CORT) 0.2 % Apply 1 application topically 2 (two) times daily.  60 g  3  . indomethacin (INDOCIN) 25 MG capsule Take 1 capsule by mouth 3  times a day as needed  90 capsule  3  . Insulin Glargine (LANTUS SOLOSTAR) 100 UNIT/ML Solostar Pen Inject 15 Units into the skin at bedtime. Dx: 250.60  5 pen  3  . Insulin Pen Needle 31G X 6 MM MISC Inject 15 Units as directed daily. Dx: 250.60  200 each  3  . metFORMIN (GLUCOPHAGE) 1000 MG tablet Take 1 tablet (1,000 mg total) by mouth 2 (two) times daily with a meal.  180 tablet  3  . Multiple Vitamin (MULTIVITAMIN) capsule Take 1 capsule by mouth daily.        Marland Kitchen omeprazole (PRILOSEC) 20 MG capsule Take 1 capsule (20 mg total) by mouth 2 (two) times daily before a meal.  180 capsule  3  . simvastatin (ZOCOR) 40 MG tablet Take 1 tablet (40 mg total) by mouth at bedtime.  90 tablet  3  . triamcinolone lotion (KENALOG) 0.1 % Apply 1  application topically 3 (three) times daily.  60 mL  3   No current facility-administered medications on file prior to visit.    Allergies  Allergen Reactions  . Glipizide     REACTION: wt. gain, hands tingling  . Ibuprofen     REACTION: unspecified  . Ramipril     REACTION: cough    Past Medical History  Diagnosis Date  . Diabetes mellitus   . GERD (gastroesophageal reflux disease)   . Gout   . Hyperlipidemia   . Hypertension   . Arthritis   . Psoriasis   . Hammertoe   . Bunion 04/02/2013    STATUS POST OP BUN REPAIR  . Plantar flexed metatarsal   . Adenomatous polyp     Past Surgical History  Procedure Laterality Date  . Hip fracture surgery  1993    surgery x2  . Total hip arthroplasty      Left hip replacement/femur fx 07/04, Screw removal left hip- [facility] 11/01  . Foot surgery Right 08.08.14    BUNIION HAM TOE2,3 PINS ,2ND MET  OSTEOTOMY, 5TH MET W/SCREW    Family History  Problem Relation Age of Onset  . Diabetes Mother   . Cancer Neg Hx     History   Social History  . Marital Status: Married    Spouse Name: N/A    Number of Children: 3  . Years of Education: N/A   Occupational History  . PC ADMINISTRATOR    Social History Main Topics  . Smoking status: Never Smoker   . Smokeless tobacco: Never Used  . Alcohol Use: Yes     Comment: occasional (DWI 1991)  . Drug Use: No  . Sexual Activity: Not on file   Other Topics Concern  . Not on file   Social History Narrative   [name] work on the side and has 2.5 acres   Review of Systems No headaches  No chest pain No SOB No edema     Objective:   Physical Exam  Constitutional: He appears well-developed and well-nourished. No distress.  Neck: Normal range of motion. Neck supple. No thyromegaly present.  Cardiovascular: Normal rate, regular rhythm and normal heart sounds.  Exam reveals no gallop.   No murmur heard. Pulmonary/Chest: Effort normal and breath sounds normal. No respiratory  distress. He has no wheezes. He has no rales.  Lymphadenopathy:    He has no cervical adenopathy.  Psychiatric: He has a normal mood and affect. His behavior is normal.          Assessment & Plan:

## 2014-04-15 NOTE — Assessment & Plan Note (Signed)
Due to ACEI Will have to stay off this

## 2014-09-04 ENCOUNTER — Other Ambulatory Visit: Payer: Self-pay | Admitting: Internal Medicine

## 2014-10-05 ENCOUNTER — Ambulatory Visit (INDEPENDENT_AMBULATORY_CARE_PROVIDER_SITE_OTHER): Payer: 59 | Admitting: Family Medicine

## 2014-10-05 ENCOUNTER — Encounter: Payer: Self-pay | Admitting: Family Medicine

## 2014-10-05 VITALS — BP 148/80 | HR 103 | Temp 98.4°F | Wt 232.0 lb

## 2014-10-05 DIAGNOSIS — W19XXXA Unspecified fall, initial encounter: Secondary | ICD-10-CM

## 2014-10-05 DIAGNOSIS — T148XXA Other injury of unspecified body region, initial encounter: Secondary | ICD-10-CM

## 2014-10-05 DIAGNOSIS — R600 Localized edema: Secondary | ICD-10-CM | POA: Insufficient documentation

## 2014-10-05 DIAGNOSIS — T148 Other injury of unspecified body region: Secondary | ICD-10-CM

## 2014-10-05 NOTE — Progress Notes (Signed)
Pre visit review using our clinic review tool, if applicable. No additional management support is needed unless otherwise documented below in the visit note. 

## 2014-10-05 NOTE — Assessment & Plan Note (Signed)
Large hematoma on R post leg  Some areas of evolution of bruise  slt tender   Disc warm/cool compress and elevation

## 2014-10-05 NOTE — Progress Notes (Signed)
Subjective:    Patient ID: [name], male    DOB: 1955-11-05, 59 y.o.   MRN: [id number]  HPI Here with "fluid retention" especially in legs   And also for a fall  Started this weekend  Is on amlodipine - for a while  Has not needed indocin for while   This had never happened before   Wt is up 8 lb on our scale today   No hx of CHF in the past  No CP or sob at all  No exertional symptoms  Also had a fall  On Thursday  Socks-on stairs (socks were slick)  Slipped and went down on his buttocks  Bruised and a bit sore-post legs     Chemistry      Component Value Date/Time   NA 131* 03/09/2014 1023   K 5.8* 03/09/2014 1023   CL 91* 03/09/2014 1023   CO2 23 03/09/2014 1023   BUN 11 03/09/2014 1023   CREATININE 1.00 03/09/2014 1023      Component Value Date/Time   CALCIUM 9.9 03/09/2014 1023   ALKPHOS 70 08/13/2013 0955   AST 25 08/13/2013 0955   ALT 25 08/13/2013 0955   BILITOT 0.7 08/13/2013 0955      Hx of high K -took him off lisinopril  No kidney dx that he knows of   Patient Active Problem List   Diagnosis Date Noted  . Hyperkalemia 04/15/2014  . Adenomatous polyp   . Fracture of right foot 04/24/2013  . Hammertoe   . Plantar flexed metatarsal   . Routine general medical examination at a health care facility 04/17/2011  . OSTEOARTHRITIS 03/17/2007  . Type II or unspecified type diabetes mellitus with neurological manifestations, uncontrolled 03/13/2007  . HYPERLIPIDEMIA 03/13/2007  . GOUT 03/13/2007  . HYPERTENSION 03/13/2007  . GERD 03/13/2007  . PSORIASIS 03/13/2007   Past Medical History  Diagnosis Date  . Diabetes mellitus   . GERD (gastroesophageal reflux disease)   . Gout   . Hyperlipidemia   . Hypertension   . Arthritis   . Psoriasis   . Hammertoe   . Bunion 04/02/2013    STATUS POST OP BUN REPAIR  . Plantar flexed metatarsal   . Adenomatous polyp    Past Surgical History  Procedure Laterality Date  . Hip fracture surgery  1993     surgery x2  . Total hip arthroplasty      Left hip replacement/femur fx 07/04, Screw removal left hip- [facility] 11/01  . Foot surgery Right 08.08.14    BUNIION HAM TOE2,3 PINS ,2ND MET OSTEOTOMY, 5TH MET W/SCREW   History  Substance Use Topics  . Smoking status: Never Smoker   . Smokeless tobacco: Never Used  . Alcohol Use: Yes     Comment: occasional (DWI 1991)   Family History  Problem Relation Age of Onset  . Diabetes Mother   . Cancer Neg Hx    Allergies  Allergen Reactions  . Lisinopril     hyperkalemia  . Glipizide     REACTION: wt. gain, hands tingling  . Ibuprofen     REACTION: unspecified  . Ramipril     REACTION: cough   Current Outpatient Prescriptions on File Prior to Visit  Medication Sig Dispense Refill  . Alcohol Swabs (B-D SINGLE USE SWABS REGULAR) PADS Apply 1 each topically 2 (two) times daily. 200 each 3  . amLODipine (NORVASC) 10 MG tablet Take 1 tablet (10 mg total) by mouth daily. 90 tablet  3  . aspirin 81 MG tablet Take 81 mg by mouth daily.      . calcium gluconate 500 MG tablet Take 500 mg by mouth daily.      . Cinnamon 500 MG capsule Take 500 mg by mouth 2 (two) times daily.      Marland Kitchen glucose blood (ONE TOUCH ULTRA TEST) test strip Use as instructed to test blood sugar two times daily dx: 250.60 200 each 3  . hydrocortisone valerate ointment (WEST-CORT) 0.2 % Apply 1 application topically 2 (two) times daily. 60 g 3  . indomethacin (INDOCIN) 25 MG capsule Take 1 capsule by mouth 3  times a day as needed 90 capsule 3  . Insulin Pen Needle 31G X 6 MM MISC Inject 15 Units as directed daily. Dx: 250.60 200 each 3  . LANTUS SOLOSTAR 100 UNIT/ML Solostar Pen Inject subcutaneously 15  units at bedtime 15 mL 5  . metFORMIN (GLUCOPHAGE) 1000 MG tablet Take 1 tablet (1,000 mg total) by mouth 2 (two) times daily with a meal. 180 tablet 3  . Multiple Vitamin (MULTIVITAMIN) capsule Take 1 capsule by mouth daily.      Marland Kitchen omeprazole (PRILOSEC) 20 MG capsule Take 1  capsule (20 mg total) by mouth 2 (two) times daily before a meal. 180 capsule 3  . simvastatin (ZOCOR) 40 MG tablet Take 1 tablet (40 mg total) by mouth at bedtime. 90 tablet 3  . triamcinolone lotion (KENALOG) 0.1 % Apply 1 application topically 3 (three) times daily. 60 mL 3   No current facility-administered medications on file prior to visit.    Review of Systems Review of Systems  Constitutional: Negative for fever, appetite change, fatigue and unexpected weight change.  Eyes: Negative for pain and visual disturbance.  Respiratory: Negative for cough and shortness of breath.   Cardiovascular: Negative for cp or palpitations   neg for PND or orthopnea , pos for pedal edema  Gastrointestinal: Negative for nausea, diarrhea and constipation.  Genitourinary: Negative for urgency and frequency.  Skin: Negative for pallor or rash   MSK pos for leg pain and contusion s/p fall  Neurological: Negative for weakness, light-headedness, numbness and headaches.  Hematological: Negative for adenopathy. Does not bruise/bleed easily.  Psychiatric/Behavioral: Negative for dysphoric mood. The patient is not nervous/anxious.         Objective:   Physical Exam  Constitutional: He appears well-developed and well-nourished. No distress.  overwt and well appearing   HENT:  Head: Normocephalic and atraumatic.  Mouth/Throat: Oropharynx is clear and moist.  No facial swelling   Eyes: Conjunctivae and EOM are normal. Pupils are equal, round, and reactive to light. No scleral icterus.  Neck: Normal range of motion. Neck supple. No JVD present. Carotid bruit is not present. No thyromegaly present.  Cardiovascular: Normal rate, regular rhythm, normal heart sounds and intact distal pulses.  Exam reveals no gallop.   Pulmonary/Chest: Effort normal and breath sounds normal. No respiratory distress. He has no wheezes. He has no rales.  No crackles   Abdominal: Soft. Bowel sounds are normal. He exhibits no  distension and no mass. There is no tenderness. There is no rebound and no guarding.  No HSM  Musculoskeletal: He exhibits edema and tenderness.  Trace to one plus pedal edema /pitting   R post leg -large hematoma with mild tenderness Nl rom leg/knee and hip    Lymphadenopathy:    He has no cervical adenopathy.  Neurological: He is alert. He has normal reflexes.  No cranial nerve deficit. He exhibits normal muscle tone. Coordination normal.  Skin: Skin is warm and dry. No rash noted. No erythema. No pallor.  Psychiatric: He has a normal mood and affect.          Assessment & Plan:   Problem List Items Addressed This Visit      Other   Fall    Wearing socks on stairs  Disc safety and given handout       Relevant Orders   Comprehensive metabolic panel   Brain natriuretic peptide   Hematoma    Large hematoma on R post leg  Some areas of evolution of bruise  slt tender   Disc warm/cool compress and elevation       Relevant Orders   Comprehensive metabolic panel   Brain natriuretic peptide   Pedal edema - Primary    New over the weekend No cardiac symptoms  ? If more sodium intake  Diff incl renal dz/chf (less likely)/side eff of amlodipine /venous insuff  Also recently had a fall   Lab today (hx of high K) Will update  May be a candidate for lasix  Has f/u with PCP next week as well   Disc leg elevation/ use of supp stockings   Will call if sob or other symptoms       Relevant Orders   Comprehensive metabolic panel   Brain natriuretic peptide

## 2014-10-05 NOTE — Patient Instructions (Signed)
Labs today for swelling  For the hematoma (bruise) on your leg- use warm or cool compress when able and elevate you legs when able  Take care of yourself  Will make a plan when labs come back tomorrow

## 2014-10-05 NOTE — Assessment & Plan Note (Signed)
Wearing socks on stairs  Disc safety and given handout

## 2014-10-05 NOTE — Assessment & Plan Note (Signed)
New over the weekend No cardiac symptoms  ? If more sodium intake  Diff incl renal dz/chf (less likely)/side eff of amlodipine /venous insuff  Also recently had a fall   Lab today (hx of high K) Will update  May be a candidate for lasix  Has f/u with PCP next week as well   Disc leg elevation/ use of supp stockings   Will call if sob or other symptoms

## 2014-10-06 ENCOUNTER — Other Ambulatory Visit: Payer: Self-pay

## 2014-10-06 ENCOUNTER — Telehealth: Payer: Self-pay

## 2014-10-06 LAB — COMPREHENSIVE METABOLIC PANEL
ALK PHOS: 61 IU/L (ref 39–117)
ALT: 19 IU/L (ref 0–44)
AST: 27 IU/L (ref 0–40)
Albumin/Globulin Ratio: 2.3 (ref 1.1–2.5)
Albumin: 4.3 g/dL (ref 3.5–5.5)
BUN/Creatinine Ratio: 9 (ref 9–20)
BUN: 8 mg/dL (ref 6–24)
Bilirubin Total: 0.4 mg/dL (ref 0.0–1.2)
CHLORIDE: 99 mmol/L (ref 97–108)
CO2: 23 mmol/L (ref 18–29)
Calcium: 9 mg/dL (ref 8.7–10.2)
Creatinine, Ser: 0.88 mg/dL (ref 0.76–1.27)
GFR calc Af Amer: 109 mL/min/{1.73_m2} (ref >59)
GFR calc non Af Amer: 95 mL/min/{1.73_m2} (ref >59)
GLOBULIN, TOTAL: 1.9 g/dL (ref 1.5–4.5)
GLUCOSE: 152 mg/dL — AB (ref 65–99)
POTASSIUM: 5.3 mmol/L — AB (ref 3.5–5.2)
SODIUM: 140 mmol/L (ref 134–144)
Total Protein: 6.2 g/dL (ref 6.0–8.5)

## 2014-10-06 LAB — BRAIN NATRIURETIC PEPTIDE: BNP: 18 pg/mL (ref 0.0–100.0)

## 2014-10-06 MED ORDER — FUROSEMIDE 20 MG PO TABS: 20.0000 mg | ORAL_TABLET | Freq: Every day | ORAL | Status: DC

## 2014-10-06 NOTE — Telephone Encounter (Signed)
Patient aware of lab results and recommendations.  Lasix reorder to correct pharmacy.

## 2014-10-06 NOTE — Telephone Encounter (Signed)
Lasix sent to pharmacy.  Left message to inform patient of results and recommendations.

## 2014-10-06 NOTE — Telephone Encounter (Signed)
-----   Message from Abner Greenspan, MD sent at 10/06/2014  2:01 PM EDT ----- No cause found for swelling in labs Please call in lasix 20 mg 1 po qd in the am # 15 no refills Let him know this will cause him to urinate more and hopefully help swelling  Follow up with Dr Silvio Pate as planned This medication can lower K a bit - but his is already high - so he may want to check labs at his follow up appt. I also left him a mychart message  thanks

## 2014-10-14 ENCOUNTER — Encounter: Payer: Self-pay | Admitting: Internal Medicine

## 2014-10-14 ENCOUNTER — Ambulatory Visit (INDEPENDENT_AMBULATORY_CARE_PROVIDER_SITE_OTHER): Payer: 59 | Admitting: Internal Medicine

## 2014-10-14 VITALS — BP 138/80 | HR 90 | Temp 98.6°F | Ht 73.0 in | Wt 223.0 lb

## 2014-10-14 DIAGNOSIS — R600 Localized edema: Secondary | ICD-10-CM | POA: Diagnosis not present

## 2014-10-14 DIAGNOSIS — I10 Essential (primary) hypertension: Secondary | ICD-10-CM | POA: Diagnosis not present

## 2014-10-14 DIAGNOSIS — E119 Type 2 diabetes mellitus without complications: Secondary | ICD-10-CM

## 2014-10-14 DIAGNOSIS — E875 Hyperkalemia: Secondary | ICD-10-CM | POA: Diagnosis not present

## 2014-10-14 DIAGNOSIS — E785 Hyperlipidemia, unspecified: Secondary | ICD-10-CM

## 2014-10-14 LAB — HM DIABETES FOOT EXAM

## 2014-10-14 MED ORDER — HYDROCORTISONE VALERATE 0.2 % EX OINT: 1.0000 "application " | TOPICAL_OINTMENT | Freq: Two times a day (BID) | CUTANEOUS | Status: DC

## 2014-10-14 MED ORDER — BD SWAB SINGLE USE REGULAR PADS: 1.0000 | MEDICATED_PAD | Freq: Two times a day (BID) | Status: DC

## 2014-10-14 MED ORDER — TRIAMCINOLONE ACETONIDE 0.1 % EX LOTN: 1.0000 "application " | TOPICAL_LOTION | Freq: Three times a day (TID) | CUTANEOUS | Status: DC

## 2014-10-14 MED ORDER — GLUCOSE BLOOD VI STRP: ORAL_STRIP | Status: DC

## 2014-10-14 MED ORDER — AMLODIPINE BESYLATE 10 MG PO TABS: 10.0000 mg | ORAL_TABLET | Freq: Every day | ORAL | Status: DC

## 2014-10-14 MED ORDER — SIMVASTATIN 40 MG PO TABS: 40.0000 mg | ORAL_TABLET | Freq: Every day | ORAL | Status: DC

## 2014-10-14 MED ORDER — METFORMIN HCL 1000 MG PO TABS: 1000.0000 mg | ORAL_TABLET | Freq: Two times a day (BID) | ORAL | Status: DC

## 2014-10-14 MED ORDER — INSULIN GLARGINE 100 UNIT/ML SOLOSTAR PEN
15.0000 [IU] | PEN_INJECTOR | Freq: Every day | SUBCUTANEOUS | Status: DC
Start: 2014-10-14 — End: 2015-09-24

## 2014-10-14 MED ORDER — OMEPRAZOLE 20 MG PO CPDR: 20.0000 mg | DELAYED_RELEASE_CAPSULE | Freq: Two times a day (BID) | ORAL | Status: DC

## 2014-10-14 MED ORDER — INDOMETHACIN 25 MG PO CAPS: 25.0000 mg | ORAL_CAPSULE | Freq: Three times a day (TID) | ORAL | Status: DC | PRN

## 2014-10-14 MED ORDER — FUROSEMIDE 20 MG PO TABS: 20.0000 mg | ORAL_TABLET | Freq: Every day | ORAL | Status: DC | PRN

## 2014-10-14 MED ORDER — INSULIN PEN NEEDLE 31G X 6 MM MISC: 15.0000 [IU] | Freq: Every day | Status: DC

## 2014-10-14 NOTE — Addendum Note (Signed)
Addended by: Despina Hidden on: 10/14/2014 08:43 AM   Modules accepted: Orders

## 2014-10-14 NOTE — Assessment & Plan Note (Signed)
Borderline now Should not have ACEI or ARB

## 2014-10-14 NOTE — Progress Notes (Signed)
Subjective:    Patient ID: [name], male    DOB: Oct 09, 1955, 59 y.o.   MRN: [id number]  HPI Here for follow up of diabetes and other medical problems  Edema did get better--even just the next morning after his last visit Did take 4-5 of the furosemide Not sure if pain in legs from fall is associated with this---this is improving  No other concerns  Sugars are variable--- checks at least every other day Range is 95-155. Mostly 120-130 fasting No hypoglycemic reactions lately Usual sensory changes in feet---especially in right (numb). May be from past surgery No ulcers but has callous on right he is lotioning to try to get it down  No chest pain No SOB No dizziness or syncope Some sinus type headaches with pollen coming out  Still on omeprazole bid Rare heatburn No swallowing problems  Current Outpatient Prescriptions on File Prior to Visit  Medication Sig Dispense Refill  . Alcohol Swabs (B-D SINGLE USE SWABS REGULAR) PADS Apply 1 each topically 2 (two) times daily. 200 each 3  . amLODipine (NORVASC) 10 MG tablet Take 1 tablet (10 mg total) by mouth daily. 90 tablet 3  . aspirin 81 MG tablet Take 81 mg by mouth daily.      . calcium gluconate 500 MG tablet Take 500 mg by mouth daily.      . Cinnamon 500 MG capsule Take 500 mg by mouth 2 (two) times daily.      Marland Kitchen glucose blood (ONE TOUCH ULTRA TEST) test strip Use as instructed to test blood sugar two times daily dx: 250.60 200 each 3  . hydrocortisone valerate ointment (WEST-CORT) 0.2 % Apply 1 application topically 2 (two) times daily. 60 g 3  . indomethacin (INDOCIN) 25 MG capsule Take 1 capsule by mouth 3  times a day as needed 90 capsule 3  . Insulin Pen Needle 31G X 6 MM MISC Inject 15 Units as directed daily. Dx: 250.60 200 each 3  . LANTUS SOLOSTAR 100 UNIT/ML Solostar Pen Inject subcutaneously 15  units at bedtime 15 mL 5  . metFORMIN (GLUCOPHAGE) 1000 MG tablet Take 1 tablet (1,000 mg total) by mouth 2 (two)  times daily with a meal. 180 tablet 3  . Multiple Vitamin (MULTIVITAMIN) capsule Take 1 capsule by mouth daily.      Marland Kitchen omeprazole (PRILOSEC) 20 MG capsule Take 1 capsule (20 mg total) by mouth 2 (two) times daily before a meal. 180 capsule 3  . simvastatin (ZOCOR) 40 MG tablet Take 1 tablet (40 mg total) by mouth at bedtime. 90 tablet 3  . triamcinolone lotion (KENALOG) 0.1 % Apply 1 application topically 3 (three) times daily. 60 mL 3   No current facility-administered medications on file prior to visit.    Allergies  Allergen Reactions  . Lisinopril     hyperkalemia  . Glipizide     REACTION: wt. gain, hands tingling  . Ibuprofen     REACTION: unspecified  . Ramipril     REACTION: cough    Past Medical History  Diagnosis Date  . Diabetes mellitus   . GERD (gastroesophageal reflux disease)   . Gout   . Hyperlipidemia   . Hypertension   . Arthritis   . Psoriasis   . Hammertoe   . Bunion 04/02/2013    STATUS POST OP BUN REPAIR  . Plantar flexed metatarsal   . Adenomatous polyp     Past Surgical History  Procedure Laterality Date  .  Hip fracture surgery  1993    surgery x2  . Total hip arthroplasty      Left hip replacement/femur fx 07/04, Screw removal left hip- [facility] 11/01  . Foot surgery Right 08.08.14    BUNIION HAM TOE2,3 PINS ,2ND MET OSTEOTOMY, 5TH MET W/SCREW    Family History  Problem Relation Age of Onset  . Diabetes Mother   . Cancer Neg Hx     History   Social History  . Marital Status: Married    Spouse Name: N/A  . Number of Children: 3  . Years of Education: N/A   Occupational History  . PC ADMINISTRATOR    Social History Main Topics  . Smoking status: Never Smoker   . Smokeless tobacco: Never Used  . Alcohol Use: Yes     Comment: occasional (DWI 1991)  . Drug Use: No  . Sexual Activity: Not on file   Other Topics Concern  . 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 Weight back down again Appetite isfine    Objective:   Physical Exam  Constitutional: He appears well-developed and well-nourished. No distress.  Neck: Normal range of motion. Neck supple. No thyromegaly present.  Cardiovascular: Normal rate, regular rhythm, normal heart sounds and intact distal pulses.  Exam reveals no gallop.   No murmur heard. Pulmonary/Chest: Effort normal and breath sounds normal. No respiratory distress. He has no wheezes. He has no rales.  Abdominal: Soft. There is no tenderness.  Musculoskeletal: He exhibits no edema or tenderness.  Lymphadenopathy:    He has no cervical adenopathy.  Neurological:  Normal fine touch sensation in feet--just some right foot numbness  Skin:  Small plantar callous on right foot No ulcers  Psychiatric: He has a normal mood and affect. His behavior is normal.          Assessment & Plan:

## 2014-10-14 NOTE — Progress Notes (Signed)
Pre visit review using our clinic review tool, if applicable. No additional management support is needed unless otherwise documented below in the visit note. 

## 2014-10-14 NOTE — Assessment & Plan Note (Signed)
Better now Will keep the furosemide for prn use

## 2014-10-14 NOTE — Assessment & Plan Note (Signed)
No problems with statin Due for labs 

## 2014-10-14 NOTE — Assessment & Plan Note (Signed)
Hopefully good control Right foot numbness from past nerve damage Will check A1c

## 2014-10-14 NOTE — Assessment & Plan Note (Signed)
BP Readings from Last 3 Encounters:  10/14/14 138/80  10/05/14 148/80  04/15/14 145/80   Good control

## 2014-10-15 LAB — LIPID PANEL
Chol/HDL Ratio: 2.4 ratio units (ref 0.0–5.0)
Cholesterol, Total: 145 mg/dL (ref 100–199)
HDL: 61 mg/dL (ref >39)
LDL Calculated: 65 mg/dL (ref 0–99)
TRIGLYCERIDES: 96 mg/dL (ref 0–149)
VLDL Cholesterol Cal: 19 mg/dL (ref 5–40)

## 2014-10-15 LAB — HEMOGLOBIN A1C
Est. average glucose Bld gHb Est-mCnc: 169 mg/dL
HEMOGLOBIN A1C: 7.5 % — AB (ref 4.8–5.6)

## 2015-02-16 ENCOUNTER — Other Ambulatory Visit: Payer: Self-pay | Admitting: Internal Medicine

## 2015-04-10 ENCOUNTER — Other Ambulatory Visit: Payer: Self-pay | Admitting: Internal Medicine

## 2015-04-23 ENCOUNTER — Encounter: Payer: 59 | Admitting: Internal Medicine

## 2015-04-29 ENCOUNTER — Encounter: Payer: Self-pay | Admitting: Internal Medicine

## 2015-04-29 ENCOUNTER — Ambulatory Visit (INDEPENDENT_AMBULATORY_CARE_PROVIDER_SITE_OTHER): Payer: 59 | Admitting: Internal Medicine

## 2015-04-29 VITALS — BP 138/70 | HR 107 | Temp 98.2°F | Ht 73.0 in | Wt 217.0 lb

## 2015-04-29 DIAGNOSIS — E119 Type 2 diabetes mellitus without complications: Secondary | ICD-10-CM | POA: Diagnosis not present

## 2015-04-29 DIAGNOSIS — I10 Essential (primary) hypertension: Secondary | ICD-10-CM

## 2015-04-29 DIAGNOSIS — Z0000 Encounter for general adult medical examination without abnormal findings: Secondary | ICD-10-CM | POA: Diagnosis not present

## 2015-04-29 MED ORDER — FUROSEMIDE 20 MG PO TABS: 20.0000 mg | ORAL_TABLET | Freq: Every day | ORAL | Status: DC | PRN

## 2015-04-29 NOTE — Assessment & Plan Note (Signed)
BP Readings from Last 3 Encounters:  04/29/15 138/70  10/14/14 138/80  10/05/14 148/80   Good control

## 2015-04-29 NOTE — Assessment & Plan Note (Signed)
Seems to have good control. 

## 2015-04-29 NOTE — Progress Notes (Signed)
Subjective:    Patient ID: [name], male    DOB: May 18, 1956, 59 y.o.   MRN: [id number]  HPI Here for physical  Ongoing numbness and pain at times in right foot 3rd toe is stiff and goes up--and 2nd toe goes under Wears toe alignment device to keep the toe down Hasn't replaced his shoes in a while This is from past surgery  Had wellness screening at work--A1c 7.0 Checks sugars twice a day a few times a week Fasting usually 100-140's Occasionally gets mild hypoglycemic reactions--rare Has eye exam 10/23---[employer] retired  Current Outpatient Prescriptions on File Prior to Visit  Medication Sig Dispense Refill  . Alcohol Swabs (B-D SINGLE USE SWABS REGULAR) PADS Apply 1 each topically 2 (two) times daily. 200 each 3  . amLODipine (NORVASC) 10 MG tablet Take 1 tablet by mouth  daily 90 tablet 3  . aspirin 81 MG tablet Take 81 mg by mouth daily.      . calcium gluconate 500 MG tablet Take 500 mg by mouth daily.      . Cinnamon 500 MG capsule Take 500 mg by mouth 2 (two) times daily.      . furosemide (LASIX) 20 MG tablet Take 1 tablet (20 mg total) by mouth daily as needed. 30 tablet 0  . glucose blood (ONE TOUCH ULTRA TEST) test strip Use as instructed to test blood sugar two times daily dx: 250.60 200 each 3  . hydrocortisone valerate ointment (WEST-CORT) 0.2 % Apply 1 application topically 2 (two) times daily. 60 g 3  . indomethacin (INDOCIN) 25 MG capsule Take 1 capsule (25 mg total) by mouth 3 (three) times daily as needed. 90 capsule 3  . Insulin Glargine (LANTUS SOLOSTAR) 100 UNIT/ML Solostar Pen Inject 15 Units into the skin daily. Dx:E11.9 15 mL 5  . Insulin Pen Needle 31G X 6 MM MISC Inject 15 Units as directed daily. Dx: E11.9 200 each 3  . lisinopril (PRINIVIL,ZESTRIL) 20 MG tablet Take 1 tablet by mouth  daily 90 tablet 0  . metFORMIN (GLUCOPHAGE) 1000 MG tablet Take 1 tablet (1,000 mg total) by mouth 2 (two) times daily with a meal. 180 tablet 3  . Multiple Vitamin  (MULTIVITAMIN) capsule Take 1 capsule by mouth daily.      Marland Kitchen omeprazole (PRILOSEC) 20 MG capsule Take 1 capsule (20 mg total) by mouth 2 (two) times daily before a meal. 180 capsule 3  . simvastatin (ZOCOR) 40 MG tablet Take 1 tablet (40 mg total) by mouth at bedtime. 90 tablet 3  . triamcinolone lotion (KENALOG) 0.1 % Apply 1 application topically 3 (three) times daily. 60 mL 3   No current facility-administered medications on file prior to visit.    Allergies  Allergen Reactions  . Lisinopril     hyperkalemia  . Glipizide     REACTION: wt. gain, hands tingling  . Ibuprofen     REACTION: unspecified  . Ramipril     REACTION: cough    Past Medical History  Diagnosis Date  . Diabetes mellitus   . GERD (gastroesophageal reflux disease)   . Gout   . Hyperlipidemia   . Hypertension   . Arthritis   . Psoriasis   . Hammertoe   . Bunion 04/02/2013    STATUS POST OP BUN REPAIR  . Plantar flexed metatarsal   . Adenomatous polyp     Past Surgical History  Procedure Laterality Date  . Hip fracture surgery  1993  surgery x2  . Total hip arthroplasty      Left hip replacement/femur fx 07/04, Screw removal left hip- [facility] 11/01  . Foot surgery Right 08.08.14    BUNIION HAM TOE2,3 PINS ,2ND MET OSTEOTOMY, 5TH MET W/SCREW    Family History  Problem Relation Age of Onset  . Diabetes Mother   . Cancer Neg Hx     Social History   Social History  . Marital Status: Married    Spouse Name: N/A  . Number of Children: 3  . Years of Education: N/A   Occupational History  . PC ADMINISTRATOR    Social History Main Topics  . Smoking status: Never Smoker   . Smokeless tobacco: Never Used  . Alcohol Use: Yes     Comment: occasional (DWI 1991)  . Drug Use: No  . Sexual Activity: Not on file   Other Topics Concern  . 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        Has lost 6# recently Wears seat belt  HENT: Positive for dental problem and tinnitus. Negative for hearing loss.        Recent cavity filled--- may need 1 other tooth worked on  Eyes: Negative for visual disturbance.       No diplopia or unilateral vision loss  Respiratory: Negative for cough, chest tightness and shortness of breath.   Cardiovascular: Positive for leg swelling. Negative for chest pain and palpitations.       Takes the furosemide once in a while  Endocrine: Negative for polydipsia and polyuria.  Genitourinary: Positive for difficulty urinating.       Mild slow stream Nocturia x 2 usually No sexual problems  Musculoskeletal: Negative for back pain, joint swelling and arthralgias.       No regular exercise  Skin:       Scattered small rashes No suspicious lesions  Allergic/Immunologic: Negative for environmental allergies and immunocompromised state.  Neurological: Positive for numbness and headaches. Negative for dizziness, syncope, weakness and light-headedness.       Rare sinus headaches  Hematological: Negative for adenopathy. Does not bruise/bleed easily.  Psychiatric/Behavioral: Negative for sleep disturbance and dysphoric mood. The patient is not nervous/anxious.        Objective:   Physical Exam  Constitutional: He is oriented to person, place, and time. He appears well-developed and well-nourished. No distress.  HENT:  Head: Normocephalic and atraumatic.  Right Ear: External ear normal.  Left Ear: External ear normal.  Mouth/Throat: Oropharynx is clear and moist. No oropharyngeal exudate.  Eyes: Conjunctivae and EOM are normal. Pupils are equal, round, and reactive to light.  Neck: Normal range of motion. Neck supple. No thyromegaly present.  Cardiovascular: Normal rate, regular rhythm, normal heart sounds and intact distal pulses.  Exam reveals no gallop.   No murmur heard. Pulmonary/Chest: Effort normal and breath sounds normal. No respiratory distress.  He has no wheezes. He has no rales.  Abdominal: Soft. There is no tenderness.  Musculoskeletal: He exhibits no edema or tenderness.  Lymphadenopathy:    He has no cervical adenopathy.  Neurological: He is alert and oriented to person, place, and time.  Decreased sensation in right foot  Skin: No rash noted. No erythema.  No foot lesions  Psychiatric: He has a normal mood and affect. His behavior is normal.  Assessment & Plan:

## 2015-04-29 NOTE — Assessment & Plan Note (Signed)
Healthy but needs to work on fitness Flu vaccine at work Will defer PSA Colon due 2018

## 2015-04-29 NOTE — Progress Notes (Signed)
Pre visit review using our clinic review tool, if applicable. No additional management support is needed unless otherwise documented below in the visit note. 

## 2015-05-01 LAB — RENAL FUNCTION PANEL
Albumin: 4.8 g/dL (ref 3.5–5.5)
BUN / CREAT RATIO: 11 (ref 9–20)
BUN: 11 mg/dL (ref 6–24)
CO2: 26 mmol/L (ref 18–29)
Calcium: 10.2 mg/dL (ref 8.7–10.2)
Chloride: 92 mmol/L — ABNORMAL LOW (ref 97–108)
Creatinine, Ser: 1.02 mg/dL (ref 0.76–1.27)
GFR calc Af Amer: 93 mL/min/{1.73_m2} (ref >59)
GFR, EST NON AFRICAN AMERICAN: 80 mL/min/{1.73_m2} (ref >59)
Glucose: 119 mg/dL — ABNORMAL HIGH (ref 65–99)
POTASSIUM: 5 mmol/L (ref 3.5–5.2)
Phosphorus: 4.2 mg/dL (ref 2.5–4.5)
SODIUM: 137 mmol/L (ref 134–144)

## 2015-05-01 LAB — HEMOGLOBIN A1C
Est. average glucose Bld gHb Est-mCnc: 151 mg/dL
Hgb A1c MFr Bld: 6.9 % — ABNORMAL HIGH (ref 4.8–5.6)

## 2015-05-14 LAB — HM DIABETES EYE EXAM

## 2015-05-20 ENCOUNTER — Encounter: Payer: Self-pay | Admitting: Internal Medicine

## 2015-05-27 ENCOUNTER — Encounter: Payer: Self-pay | Admitting: Internal Medicine

## 2015-06-18 ENCOUNTER — Other Ambulatory Visit: Payer: Self-pay | Admitting: Internal Medicine

## 2015-08-10 ENCOUNTER — Other Ambulatory Visit: Payer: Self-pay | Admitting: *Deleted

## 2015-08-10 MED ORDER — TRIAMCINOLONE ACETONIDE 0.1 % EX LOTN: 1.0000 "application " | TOPICAL_LOTION | Freq: Three times a day (TID) | CUTANEOUS | Status: DC

## 2015-08-10 MED ORDER — METFORMIN HCL 1000 MG PO TABS: 1000.0000 mg | ORAL_TABLET | Freq: Two times a day (BID) | ORAL | Status: DC

## 2015-08-10 MED ORDER — HYDROCORTISONE VALERATE 0.2 % EX OINT: 1.0000 "application " | TOPICAL_OINTMENT | Freq: Two times a day (BID) | CUTANEOUS | Status: DC

## 2015-08-10 MED ORDER — SIMVASTATIN 40 MG PO TABS: 40.0000 mg | ORAL_TABLET | Freq: Every day | ORAL | Status: DC

## 2015-08-12 ENCOUNTER — Other Ambulatory Visit: Payer: Self-pay | Admitting: *Deleted

## 2015-08-12 MED ORDER — FUROSEMIDE 20 MG PO TABS: 20.0000 mg | ORAL_TABLET | Freq: Every day | ORAL | Status: DC | PRN

## 2015-08-27 ENCOUNTER — Encounter: Payer: 59 | Admitting: Internal Medicine

## 2015-09-14 ENCOUNTER — Telehealth: Payer: Self-pay | Admitting: Podiatry

## 2015-09-14 NOTE — Telephone Encounter (Signed)
09/14/15- patients wife stopped by the office said patient signed a release to have office notes and op report sent to [facility] [address], [place]. They got the office notes, but no op report. She thinks the surgery for him was 2013-2014. She said he had two different surgeries. Release on file, needs office notes faxed to [place] for another appointment. They will not make appt until they get the notes. Please call patients wife  When notes have been sent. [phone].

## 2015-09-17 NOTE — Telephone Encounter (Signed)
Im not really sure who needs to take care of this, medical records? Maybe? Delydia? Melanie? Let me know, but I think this is a medical records request.

## 2015-09-24 ENCOUNTER — Other Ambulatory Visit: Payer: Self-pay | Admitting: Internal Medicine

## 2015-09-24 NOTE — Telephone Encounter (Signed)
Rx sent electronically.  

## 2015-10-13 ENCOUNTER — Telehealth: Payer: Self-pay | Admitting: *Deleted

## 2015-10-13 NOTE — Telephone Encounter (Signed)
[name] states pt has signed a release and Dr. Doran Durand needs a copy of the two operative reports performed by our surgeons.  Please fax to [facility] [phone].

## 2015-10-19 NOTE — Telephone Encounter (Signed)
Waiting on [name] from [facility] to send me a page on the first surgery then I will get faxed to [place] first thing Friday morning ([phone].)

## 2015-10-29 ENCOUNTER — Ambulatory Visit (INDEPENDENT_AMBULATORY_CARE_PROVIDER_SITE_OTHER): Payer: 59 | Admitting: Internal Medicine

## 2015-10-29 ENCOUNTER — Encounter: Payer: Self-pay | Admitting: Internal Medicine

## 2015-10-29 VITALS — BP 140/90 | HR 102 | Temp 98.2°F | Wt 219.0 lb

## 2015-10-29 DIAGNOSIS — E785 Hyperlipidemia, unspecified: Secondary | ICD-10-CM

## 2015-10-29 DIAGNOSIS — E08621 Diabetes mellitus due to underlying condition with foot ulcer: Secondary | ICD-10-CM

## 2015-10-29 DIAGNOSIS — Z794 Long term (current) use of insulin: Secondary | ICD-10-CM | POA: Diagnosis not present

## 2015-10-29 DIAGNOSIS — E119 Type 2 diabetes mellitus without complications: Secondary | ICD-10-CM

## 2015-10-29 DIAGNOSIS — L97509 Non-pressure chronic ulcer of other part of unspecified foot with unspecified severity: Secondary | ICD-10-CM

## 2015-10-29 DIAGNOSIS — E11621 Type 2 diabetes mellitus with foot ulcer: Secondary | ICD-10-CM | POA: Insufficient documentation

## 2015-10-29 DIAGNOSIS — L97519 Non-pressure chronic ulcer of other part of right foot with unspecified severity: Secondary | ICD-10-CM

## 2015-10-29 LAB — HM DIABETES FOOT EXAM

## 2015-10-29 NOTE — Assessment & Plan Note (Signed)
Not infected Discussed stopping the iodine Neosporin only for now Don't overdebride

## 2015-10-29 NOTE — Assessment & Plan Note (Signed)
Will check labs No problems with statin

## 2015-10-29 NOTE — Progress Notes (Signed)
Subjective:    Patient ID: [name], male    DOB: 02/17/1956, 60 y.o.   MRN: [id number]  HPI Here for follow up of chronic medical conditions--esp diabetes  Checks sugars regularly Fairly stable--generally under 140 fasting No hypoglycemic spells Has sore on bottom of right foot--couldn't get into podiatrist (doesn't want to go to [facility] anymore) so he needs it checked (from callous--then open sore) Using iodine daily  No chest pain No dizziness or sycnope Occasional edema--furosemide helps (rare) No palpitations  Current Outpatient Prescriptions on File Prior to Visit  Medication Sig Dispense Refill  . Alcohol Swabs (B-D SINGLE USE SWABS REGULAR) PADS Apply 1 each topically 2 (two) times daily. 200 each 3  . amLODipine (NORVASC) 10 MG tablet Take 1 tablet by mouth  daily 90 tablet 3  . aspirin 81 MG tablet Take 81 mg by mouth daily.      . calcium gluconate 500 MG tablet Take 500 mg by mouth daily.      . Cinnamon 500 MG capsule Take 500 mg by mouth 2 (two) times daily.      . furosemide (LASIX) 20 MG tablet Take 1 tablet (20 mg total) by mouth daily as needed. 90 tablet 1  . glucose blood (ONE TOUCH ULTRA TEST) test strip Use as instructed to test blood sugar two times daily dx: 250.60 200 each 3  . hydrocortisone valerate ointment (WEST-CORT) 0.2 % Apply 1 application topically 2 (two) times daily. 60 g 3  . indomethacin (INDOCIN) 25 MG capsule Take 1 capsule (25 mg total) by mouth 3 (three) times daily as needed. 90 capsule 3  . Insulin Pen Needle 31G X 6 MM MISC Inject 15 Units as directed daily. Dx: E11.9 200 each 3  . LANTUS SOLOSTAR 100 UNIT/ML Solostar Pen Inject subcutaneously 15  units at bedtime 15 mL 0  . lisinopril (PRINIVIL,ZESTRIL) 20 MG tablet Take 1 tablet (20 mg total) by mouth daily. 90 tablet 1  . metFORMIN (GLUCOPHAGE) 1000 MG tablet Take 1 tablet (1,000 mg total) by mouth 2 (two) times daily with a meal. 180 tablet 3  . Multiple Vitamin (MULTIVITAMIN)  capsule Take 1 capsule by mouth daily.      Marland Kitchen omeprazole (PRILOSEC) 20 MG capsule Take 1 capsule (20 mg total) by mouth 2 (two) times daily before a meal. 180 capsule 3  . simvastatin (ZOCOR) 40 MG tablet Take 1 tablet (40 mg total) by mouth at bedtime. 90 tablet 3  . triamcinolone lotion (KENALOG) 0.1 % Apply 1 application topically 3 (three) times daily. 60 mL 3   No current facility-administered medications on file prior to visit.    Allergies  Allergen Reactions  . Lisinopril     hyperkalemia  . Glipizide     REACTION: wt. gain, hands tingling  . Ibuprofen     REACTION: unspecified  . Ramipril     REACTION: cough    Past Medical History  Diagnosis Date  . Diabetes mellitus   . GERD (gastroesophageal reflux disease)   . Gout   . Hyperlipidemia   . Hypertension   . Arthritis   . Psoriasis   . Hammertoe   . Bunion 04/02/2013    STATUS POST OP BUN REPAIR  . Plantar flexed metatarsal   . Adenomatous polyp     Past Surgical History  Procedure Laterality Date  . Hip fracture surgery  1993    surgery x2  . Total hip arthroplasty  Left hip replacement/femur fx 07/04, Screw removal left hip- [facility] 11/01  . Foot surgery Right 08.08.14    BUNIION HAM TOE2,3 PINS ,2ND MET OSTEOTOMY, 5TH MET W/SCREW    Family History  Problem Relation Age of Onset  . Diabetes Mother   . Cancer Neg Hx     Social History   Social History  . Marital Status: Married    Spouse Name: N/A  . Number of Children: 3  . Years of Education: N/A   Occupational History  . PC ADMINISTRATOR    Social History Main Topics  . Smoking status: Never Smoker   . Smokeless tobacco: Never Used  . Alcohol Use: Yes     Comment: occasional (DWI 1991)  . Drug Use: No  . Sexual Activity: Not on file   Other Topics Concern  . Not on file   Social History Narrative   [name] work on the side and has [place] acres   Review of Systems Not really exercising--discussed  Sleeps okay No gout  problems Appetite is okay Weight stable    Objective:   Physical Exam  Constitutional: He appears well-developed and well-nourished. No distress.  Neck: Normal range of motion. Neck supple.  Cardiovascular: Normal rate, regular rhythm, normal heart sounds and intact distal pulses.  Exam reveals no gallop.   No murmur heard. Pulmonary/Chest: Effort normal and breath sounds normal. No respiratory distress. He has no wheezes. He has no rales.  Musculoskeletal: He exhibits no edema.  Lymphadenopathy:    He has no cervical adenopathy.  Neurological:  Normal sensation in feet  Skin:  ~12-80m round shallow ulcer under right 3rd metatarsal head. Surrounded by callous Not inflamed No other lesions  Psychiatric: He has a normal mood and affect. His behavior is normal.          Assessment & Plan:

## 2015-10-29 NOTE — Progress Notes (Signed)
Pre visit review using our clinic review tool, if applicable. No additional management support is needed unless otherwise documented below in the visit note. 

## 2015-10-29 NOTE — Assessment & Plan Note (Signed)
Still seems to have good control Will check labs and cholesterol

## 2015-10-30 LAB — LIPID PANEL
CHOLESTEROL TOTAL: 154 mg/dL (ref 100–199)
Chol/HDL Ratio: 1.9 ratio units (ref 0.0–5.0)
HDL: 83 mg/dL (ref >39)
LDL Calculated: 60 mg/dL (ref 0–99)
TRIGLYCERIDES: 57 mg/dL (ref 0–149)
VLDL Cholesterol Cal: 11 mg/dL (ref 5–40)

## 2015-10-30 LAB — HEMOGLOBIN A1C
Est. average glucose Bld gHb Est-mCnc: 157 mg/dL
Hgb A1c MFr Bld: 7.1 % — ABNORMAL HIGH (ref 4.8–5.6)

## 2015-11-02 ENCOUNTER — Other Ambulatory Visit: Payer: Self-pay | Admitting: Internal Medicine

## 2015-11-14 ENCOUNTER — Other Ambulatory Visit: Payer: Self-pay | Admitting: Internal Medicine

## 2015-12-09 ENCOUNTER — Telehealth: Payer: Self-pay | Admitting: Internal Medicine

## 2015-12-09 DIAGNOSIS — L97509 Non-pressure chronic ulcer of other part of unspecified foot with unspecified severity: Principal | ICD-10-CM

## 2015-12-09 DIAGNOSIS — E08621 Diabetes mellitus due to underlying condition with foot ulcer: Secondary | ICD-10-CM

## 2015-12-09 NOTE — Telephone Encounter (Signed)
Wife called - wants a referral to wound center - wound on foot is getting worse Please call - wife [phone] Thank you

## 2015-12-09 NOTE — Telephone Encounter (Signed)
Referral placed.

## 2015-12-14 ENCOUNTER — Encounter: Payer: 59 | Attending: Internal Medicine | Admitting: Internal Medicine

## 2015-12-14 DIAGNOSIS — M109 Gout, unspecified: Secondary | ICD-10-CM | POA: Insufficient documentation

## 2015-12-14 DIAGNOSIS — L97511 Non-pressure chronic ulcer of other part of right foot limited to breakdown of skin: Secondary | ICD-10-CM | POA: Diagnosis not present

## 2015-12-14 DIAGNOSIS — L97411 Non-pressure chronic ulcer of right heel and midfoot limited to breakdown of skin: Secondary | ICD-10-CM | POA: Diagnosis not present

## 2015-12-14 DIAGNOSIS — E1142 Type 2 diabetes mellitus with diabetic polyneuropathy: Secondary | ICD-10-CM | POA: Diagnosis not present

## 2015-12-14 DIAGNOSIS — Z794 Long term (current) use of insulin: Secondary | ICD-10-CM | POA: Diagnosis not present

## 2015-12-14 DIAGNOSIS — I10 Essential (primary) hypertension: Secondary | ICD-10-CM | POA: Insufficient documentation

## 2015-12-14 DIAGNOSIS — E11621 Type 2 diabetes mellitus with foot ulcer: Secondary | ICD-10-CM | POA: Diagnosis not present

## 2015-12-15 NOTE — Progress Notes (Signed)
[name], Bradley POVEROMO ([id number]) Visit Report for 12/14/2015 Abuse/Suicide Risk Screen Details Patient Name: [name], [name] Date of Service: 12/14/2015 2:15 PM Medical Record Patient Account Number: [id number] Number: Treating RN: Baruch Gouty, RN, BSN, Rita 06/09/1956 [phone] y.o. Other Clinician: Date of Birth/Sex: Male) Treating ROBSON, MICHAEL Primary Care Physician: Viviana Simpler Physician/Extender: G Referring Physician: Marily Memos in Treatment: 0 Abuse/Suicide Risk Screen Items Answer ABUSE/SUICIDE RISK SCREEN: Has anyone close to you tried to hurt or harm you recentlyo No Do you feel uncomfortable with anyone in your familyo No Has anyone forced you do things that you didnot want to doo No Do you have any thoughts of harming yourselfo No Patient displays signs or symptoms of abuse and/or neglect. No Electronic Signature(s) Signed: 12/14/2015 5:06:43 PM By: Regan Lemming BSN, RN Entered By: Regan Lemming on 12/14/2015 14:10:11 Rodda, Gerda Diss ([id number]) -------------------------------------------------------------------------------- Activities of Daily Living Details Patient Name: [name], [name] Date of Service: 12/14/2015 2:15 PM Medical Record Patient Account Number: [id number] Number: Treating RN: Baruch Gouty, RN, BSN, Rita 26-Mar-1956 [phone] y.o. Other Clinician: Date of Birth/Sex: Male) Treating ROBSON, [place] Primary Care Physician: Viviana Simpler Physician/Extender: G Referring Physician: Marily Memos in Treatment: 0 Activities of Daily Living Items Answer Activities of Daily Living (Please select one for each item) Drive Automobile Completely Able Take Medications Completely Able Use Telephone Completely Able Care for Appearance Completely Able Use Toilet Completely Able Bath / Shower Completely Able Dress Self Completely Able Feed Self Completely Able Walk Completely Able Get In / Out Bed Completely Able Housework Completely  Able Prepare Meals Completely Able Handle Money Completely Able Shop for Self Completely Able Electronic Signature(s) Signed: 12/14/2015 5:06:43 PM By: Regan Lemming BSN, RN Entered By: Regan Lemming on 12/14/2015 14:25:45 Sy[place]ora, Gerda Diss ([id number]) -------------------------------------------------------------------------------- Education Assessment Details Patient Name: [name], [name] Date of Service: 12/14/2015 2:15 PM Medical Record Patient Account Number: [id number] Number: Treating RN: Baruch Gouty, RN, BSN, Rita 02-03-1956 [phone] y.o. Other Clinician: Date of Birth/Sex: Male) Treating ROBSON, MICHAEL Primary Care Physician: Viviana Simpler Physician/Extender: G Referring Physician: Marily Memos in Treatment: 0 Primary Learner Assessed: Patient Learning Preferences/Education Level/Primary Language Learning Preference: Explanation Highest Education Level: High School Preferred Language: English Cognitive Barrier Assessment/Beliefs Language Barrier: No Physical Barrier Assessment Impaired Vision: No Impaired Hearing: No Decreased Hand dexterity: No Knowledge/Comprehension Assessment Knowledge Level: High Comprehension Level: High Ability to understand written High instructions: Ability to understand verbal High instructions: Motivation Assessment Anxiety Level: Calm Cooperation: Cooperative Education Importance: Acknowledges Need Interest in Health Problems: Asks Questions Perception: Coherent Willingness to Engage in Self- High Management Activities: Readiness to Engage in Self- High Management Activities: Electronic Signature(s) Signed: 12/14/2015 5:06:43 PM By: Regan Lemming BSN, RN Entered By: Regan Lemming on 12/14/2015 14:14:16 Mabin, Gerda Diss ([id number]) Hussein, Gerda Diss ([id number]) -------------------------------------------------------------------------------- Fall Risk Assessment Details Patient Name: [name], [name] Date of Service: 12/14/2015  2:15 PM Medical Record Patient Account Number: [id number] Number: Treating RN: Baruch Gouty, RN, BSN, Rita 11/21/1955 [phone] y.o. Other Clinician: Date of Birth/Sex: Male) Treating ROBSON, MICHAEL Primary Care Physician: Viviana Simpler Physician/Extender: G Referring Physician: Marily Memos in Treatment: 0 Fall Risk Assessment Items Have you had 2 or more falls in the last 12 monthso 0 Yes Have you had any fall that resulted in injury in the last 12 monthso 0 No FALL RISK ASSESSMENT: History of falling - immediate or within 3 months 25 Yes Secondary diagnosis 0 No Ambulatory aid None/bed rest/wheelchair/nurse 0 Yes Crutches/cane/walker 0 No  Furniture 0 No IV Access/Saline Lock 0 No Gait/Training Normal/bed rest/immobile 0 Yes Weak 0 No Impaired 0 No Mental Status Oriented to own ability 0 Yes Electronic Signature(s) Signed: 12/14/2015 5:06:43 PM By: Regan Lemming BSN, RN Entered By: Regan Lemming on 12/14/2015 14:26:21 Tuman, Gerda Diss ([id number]) -------------------------------------------------------------------------------- Foot Assessment Details Patient Name: [name], [name] Date of Service: 12/14/2015 2:15 PM Medical Record Patient Account Number: [id number] Number: Treating RN: Baruch Gouty, RN, BSN, Rita [birth year] [phone] y.o. Other Clinician: Date of Birth/Sex: Male) Treating ROBSON, MICHAEL Primary Care Physician: Viviana Simpler Physician/Extender: G Referring Physician: Marily Memos in Treatment: 0 Foot Assessment Items Site Locations + = Sensation present, - = Sensation absent, C = Callus, U = Ulcer R = Redness, W = Warmth, M = Maceration, PU = Pre-ulcerative lesion F = Fissure, S = Swelling, D = Dryness Assessment Right: Left: Other Deformity: No No Prior Foot Ulcer: No No Prior Amputation: No No Charcot Joint: No No Ambulatory Status: Ambulatory Without Help Gait: Steady Electronic Signature(s) Signed: 12/14/2015 5:06:43 PM By:  Regan Lemming BSN, RN Entered By: Regan Lemming on 12/14/2015 14:14:26 Manternach, Gerda Diss ([id number]) -------------------------------------------------------------------------------- Nutrition Risk Assessment Details Patient Name: Aubuchon, Sven A. Date of Service: 12/14/2015 2:15 PM Medical Record Patient Account Number: [id number] Number: Treating RN: Baruch Gouty, RN, BSN, Rita 02/28/1956 [phone] y.o. Other Clinician: Date of Birth/Sex: Male) Treating ROBSON, [place] Primary Care Physician: Viviana Simpler Physician/Extender: G Referring Physician: Marily Memos in Treatment: 0 Height (in): 72 Weight (lbs): 223 Body Mass Index (BMI): 30.2 Nutrition Risk Assessment Items NUTRITION RISK SCREEN: I have an illness or condition that made me change the kind and/or 0 No amount of food I eat I eat fewer than two meals per day 0 No I eat few fruits and vegetables, or milk products 0 No I have three or more drinks of beer, liquor or wine almost every day 0 No I have tooth or mouth problems that make it hard for me to eat 0 No I don't always have enough money to buy the food I need 0 No I eat alone most of the time 0 No I take three or more different prescribed or over-the-counter drugs a 0 No day Without wanting to, I have lost or gained 10 pounds in the last six 0 No months I am not always physically able to shop, cook and/or feed myself 0 No Nutrition Protocols Good Risk Protocol 0 No interventions needed Moderate Risk Protocol Electronic Signature(s) Signed: 12/14/2015 5:06:43 PM By: Regan Lemming BSN, RN Entered By: Regan Lemming on 12/14/2015 14:25:52

## 2015-12-15 NOTE — Progress Notes (Signed)
Gienger, DEMAR FRITCHIE ([id number]) Visit Report for 12/14/2015 Chief Complaint Document Details Patient Name: [name], [name] Date of Service: 12/14/2015 2:15 PM Medical Record Patient Account Number: [id number] Number: Treating RN: Baruch Gouty, RN, BSN, Rita April 30, 1956 [phone] y.o. Other Clinician: Date of Birth/Sex: Male) Treating Delayla Hoffmaster Primary Care Physician: Viviana Simpler Physician/Extender: G Referring Physician: Marily Memos in Treatment: 0 Information Obtained from: Patient Chief Complaint 12/14/15; this is a 60 year old man who is a type II diabetic with diabetic neuropathy on insulin and metformin. Presents with a nonhealing ulcer on his right foot since January of this year Electronic Signature(s) Signed: 12/15/2015 7:52:30 AM By: Linton Ham MD Entered By: Linton Ham on 12/14/2015 17:28:45 Kaus, Gerda Diss ([id number]) -------------------------------------------------------------------------------- Debridement Details Patient Name: [name], [name] Date of Service: 12/14/2015 2:15 PM Medical Record Patient Account Number: [id number] Number: Treating RN: Baruch Gouty, RN, BSN, Rita 1956-07-04 [phone] y.o. Other Clinician: Date of Birth/Sex: Male) Treating Zamaya Rapaport Primary Care Physician: Viviana Simpler Physician/Extender: G Referring Physician: Marily Memos in Treatment: 0 Debridement Performed for Wound #1 Plantar Foot Assessment: Performed By: Physician Ricard Dillon, MD Debridement: Debridement Pre-procedure Yes Verification/Time Out Taken: Start Time: 14:30 Pain Control: Lidocaine 4% Topical Solution Level: Skin/Subcutaneous Tissue Total Area Debrided (L x 1.4 (cm) x 1.5 (cm) = 2.1 (cm) W): Tissue and other Non-Viable, Callus, Exudate, Fibrin/Slough, Subcutaneous material debrided: Instrument: Blade, Curette, Forceps, Scissors Bleeding: Minimum Hemostasis Achieved: Pressure End Time: 14:40 Procedural Pain: 0 Post  Procedural Pain: 0 Response to Treatment: Procedure was tolerated well Post Debridement Measurements of Total Wound Length: (cm) 1.4 Width: (cm) 1.5 Depth: (cm) 0.2 Volume: (cm) 0.33 Post Procedure Diagnosis Same as Pre-procedure Electronic Signature(s) Signed: 12/14/2015 5:30:56 PM By: Regan Lemming BSN, RN Signed: 12/15/2015 7:52:30 AM By: Linton Ham MD Previous Signature: 12/14/2015 5:06:43 PM Version By: Regan Lemming BSN, RN Entered By: Linton Ham on 12/14/2015 17:27:34 [place]am, Gerda Diss ([id number]) [place], Gerda Diss ([id number]) -------------------------------------------------------------------------------- HPI Details Patient Name: [name], [name] Date of Service: 12/14/2015 2:15 PM Medical Record Patient Account Number: [id number] Number: Treating RN: Baruch Gouty, RN, BSN, Rita March 11, 1956 [phone] y.o. Other Clinician: Date of Birth/Sex: Male) Treating Aricela Bertagnolli Primary Care Physician: Viviana Simpler Physician/Extender: G Referring Physician: Marily Memos in Treatment: 0 History of Present Illness HPI Description: 12/14/15; this is a 60 year old type II diabetic on insulin with diabetic polyneuropathy. He apparently had a callus at roughly his fourth metatarsal head in January he was attempting the. This down and it up with a ulcer. His wife who was present said they have been using saline to clean this wound and using Polysporin and Betadine- but not making much progress. His previous surgery by his podiatrist Dr. Milinda Pointer in 2014 but does not wish to go back to see Dr. Milinda Pointer and was referred here instead. He has not been on any antibiotics and has not had recent x-rays of the foot. He has no known polyneuropathy and no prior wound history [facility] appears that the patient had foot corrective surgery on 04/02/13 this consisted of an [facility] bunion repair on the right second metatarsal, osteotomy of the right fifth  metatarsal, osteotomy of the right hammertoe repair on #2 #3 in #5. Sometime after this he injured the foot he was noted to have fractures with fracture of the first metatarsal osteotomy fracture of the second metatarsal osteotomy dislocation of the third metatarsal phalangeal joint and fracture of the fifth metatarsal osteotomy site here and I  believe he went back to the operating room and had internal fixation of the right foot. He has not had any recent x-rays of the right foot. He had an MRI of the right foot in 2014 last hemoglobin A1c I see is at 7.1 on 10/29/15. He had a normal serum albumin on 04/29/15 of 4.8. He does not have any nutritional issues Electronic Signature(s) Signed: 12/15/2015 7:52:30 AM By: Linton Ham MD Entered By: Linton Ham on 12/14/2015 17:36:13 [place], Gerda Diss ([id number]) -------------------------------------------------------------------------------- Physical Exam Details Patient Name: [name], [name] Date of Service: 12/14/2015 2:15 PM Medical Record Patient Account Number: [id number] Number: Treating RN: Baruch Gouty, RN, BSN, Rita 23-Jul-1956 [phone] y.o. Other Clinician: Date of Birth/Sex: Male) Treating Donis Pinder Primary Care Physician: Viviana Simpler Physician/Extender: G Referring Physician: Marily Memos in Treatment: 0 Constitutional Sitting or standing Blood Pressure is within target range for patient.. Pulse regular and within target range for patient.Marland Kitchen Respirations regular, non-labored and within target range.. Temperature is normal and within the target range for the patient.. Patient's appearance is neat and clean. Appears in no acute distress. Well nourished and well developed.Marland Kitchen Respiratory Respiratory effort is easy and symmetric bilaterally. Rate is normal at rest and on room air.. Bilateral breath sounds are clear and equal in all lobes with no wheezes, rales or rhonchi.. Cardiovascular Heart rhythm and rate regular,  without murmur or gallop.. Femoral arteries without bruits and pulses strong.. Pedal pulses palpable and strong bilaterally.. Lymphatic None palpable in the popliteal or inguinal areas. Integumentary (Hair, Skin) No obvious skin rashes or issue other issues. Neurological Very abnormal sensation to light touch in the right foot. Psychiatric No evidence of depression, anxiety, or agitation. Calm, cooperative, and communicative. Appropriate interactions and affect.. Notes Wound exam; the patient has a ulcer on the plantar aspect of his right foot which I think is over the fourth metatarsal head rather than the third. He has had notable surgery roughly at the level of the second metatarsal. He has deformity of the fourth toe which curves under the third toe. This toe also has what appears to be an ulcer at the base with perhaps surrounding tinea pedis. He uses a prosthesis to keep his toes outlined. The ulcer itself as thick surrounding callus and fibrinous surface slough over the base Electronic Signature(s) Signed: 12/15/2015 7:52:30 AM By: Linton Ham MD Entered By: Linton Ham on 12/14/2015 17:39:09 [place], Gerda Diss ([id number]) -------------------------------------------------------------------------------- Physician Orders Details Patient Name: [name], [name] Date of Service: 12/14/2015 2:15 PM Medical Record Patient Account Number: [id number] Number: Treating RN: Baruch Gouty, RN, BSN, Rita August 04, 1955 [phone] y.o. Other Clinician: Date of Birth/Sex: Male) Treating Troye Hiemstra Primary Care Physician: Viviana Simpler Physician/Extender: G Referring Physician: Marily Memos in Treatment: 0 Verbal / Phone Orders: Yes Clinician: Afful, RN, BSN, Rita Read Back and Verified: Yes Diagnosis Coding Wound Cleansing Wound #1 Plantar Foot o Cleanse wound with mild soap and water o May Shower, gently pat wound dry prior to applying new dressing. o May shower with  protection. Wound #2 Right,Plantar Toe Fourth o Cleanse wound with mild soap and water o May Shower, gently pat wound dry prior to applying new dressing. o May shower with protection. Skin Barriers/Peri-Wound Care Wound #2 Right,Plantar Toe Fourth o Antifungal powder Primary Wound Dressing Wound #1 Plantar Foot o Prisma Ag Wound #2 Right,Plantar Toe Fourth o Aquacel Ag Secondary Dressing Wound #1 Plantar Foot o Gauze and Kerlix/Conform Wound #2 Right,Plantar Toe Fourth o Gauze and Kerlix/Conform Dressing  Change Frequency Wound #1 Plantar Foot o Change dressing every day. Wound #2 Right,Plantar Toe Fourth [name], [name] ([id number]) o Change dressing every day. Follow-up Appointments Wound #1 Plantar Foot o Return Appointment in 1 week. Wound #2 Right,Plantar Toe Fourth o Return Appointment in 1 week. Off-Loading Wound #1 Plantar Foot o Open toe surgical shoe to: - Front offloader darco Wound #2 Right,Plantar Toe Fourth o Open toe surgical shoe to: - Engineer, production Additional Orders / Instructions Wound #1 Plantar Foot o Increase protein intake. o Activity as tolerated Wound #2 Right,Plantar Toe Fourth o Increase protein intake. o Activity as tolerated Radiology o X-ray, foot - A/P lateral of right foot Electronic Signature(s) Signed: 12/14/2015 5:06:43 PM By: Regan Lemming BSN, RN Signed: 12/15/2015 7:52:30 AM By: Linton Ham MD Entered By: Regan Lemming on 12/14/2015 14:43:54 [place], Gerda Diss ([id number]) -------------------------------------------------------------------------------- Problem List Details Patient Name: Modisette, Janiel A. Date of Service: 12/14/2015 2:15 PM Medical Record Patient Account Number: [id number] Number: Treating RN: Baruch Gouty, RN, BSN, Rita 1956/02/26 [phone] y.o. Other Clinician: Date of Birth/Sex: Male) Treating Lanisa Ishler Primary Care Physician: Viviana Simpler Physician/Extender:  G Referring Physician: Marily Memos in Treatment: 0 Active Problems ICD-10 Encounter Code Description Active Date Diagnosis E11.621 Type 2 diabetes mellitus with foot ulcer 12/14/2015 Yes E11.42 Type 2 diabetes mellitus with diabetic polyneuropathy 12/14/2015 Yes Inactive Problems Resolved Problems Electronic Signature(s) Signed: 12/15/2015 7:52:30 AM By: Linton Ham MD Entered By: Linton Ham on 12/14/2015 17:27:16 Mercado, Gerda Diss ([id number]) -------------------------------------------------------------------------------- Progress Note Details Patient Name: [name], [name] Date of Service: 12/14/2015 2:15 PM Medical Record Patient Account Number: [id number] Number: Treating RN: Baruch Gouty, RN, BSN, Rita 1956/07/01 [phone] y.o. Other Clinician: Date of Birth/Sex: Male) Treating Dorothymae Maciver Primary Care Physician: Viviana Simpler Physician/Extender: G Referring Physician: Marily Memos in Treatment: 0 Subjective Chief Complaint Information obtained from Patient 12/14/15; this is a 60 year old man who is a type II diabetic with diabetic neuropathy on insulin and metformin. Presents with a nonhealing ulcer on his right foot since January of this year History of Present Illness (HPI) 12/14/15; this is a 60 year old type II diabetic on insulin with diabetic polyneuropathy. He apparently had a callus at roughly his fourth metatarsal head in January he was attempting the. This down and it up with a ulcer. His wife who was present said they have been using saline to clean this wound and using Polysporin and Betadine- but not making much progress. His previous surgery by his podiatrist Dr. Milinda Pointer in 2014 but does not wish to go back to see Dr. Milinda Pointer and was referred here instead. He has not been on any antibiotics and has not had recent x-rays of the foot. He has no known polyneuropathy and no prior wound history [facility] appears  that the patient had foot corrective surgery on 04/02/13 this consisted of an [facility] bunion repair on the right second metatarsal, osteotomy of the right fifth metatarsal, osteotomy of the right hammertoe repair on #2 #3 in #5. Sometime after this he injured the foot he was noted to have fractures with fracture of the first metatarsal osteotomy fracture of the second metatarsal osteotomy dislocation of the third metatarsal phalangeal joint and fracture of the fifth metatarsal osteotomy site here and I believe he went back to the operating room and had internal fixation of the right foot. He has not had any recent x-rays of the right foot. He had an MRI of the right foot in  2014 last hemoglobin A1c I see is at 7.1 on 10/29/15. He had a normal serum albumin on 04/29/15 of 4.8. He does not have any nutritional issues Wound History Patient presents with 1 open wound that has been present for approximately 4 months. Patient has been treating wound in the following manner: neosporin. Laboratory tests have not been performed in the last month. Patient reportedly has not tested positive for an antibiotic resistant organism. Patient reportedly has not tested positive for osteomyelitis. Patient reportedly has not had testing performed to evaluate circulation in the legs. Patient History Information obtained from Patient, Caregiver. Allergies no known allergies [name], Bradley HIERHOLZER. ([id number]) Family History Diabetes - Siblings, Maternal Grandparents, Mother, Heart Disease - Father, Siblings, Kidney Disease - Mother, Stroke - Mother, Siblings, No family history of Cancer, Hereditary Spherocytosis, Hypertension, Lung Disease, Seizures, Thyroid Problems, Tuberculosis. Social History Never smoker, Marital Status - Married, Alcohol Use - Rarely, Drug Use - No History, Caffeine Use - Daily. Medical History Eyes Denies history of Cataracts Ear/Nose/Mouth/Throat Denies history of Chronic sinus  problems/congestion, Middle ear problems Hematologic/Lymphatic Denies history of Anemia, Hemophilia, Human Immunodeficiency Virus, Lymphedema, Sickle Cell Disease Respiratory Denies history of Aspiration, Asthma, Chronic Obstructive Pulmonary Disease (COPD), Pneumothorax, Sleep Apnea, Tuberculosis Cardiovascular Patient has history of Hypertension Denies history of Angina, Arrhythmia, Congestive Heart Failure, Coronary Artery Disease, Deep Vein Thrombosis, Hypotension, Myocardial Infarction, Peripheral Arterial Disease, Peripheral Venous Disease, Phlebitis, Vasculitis Gastrointestinal Denies history of Cirrhosis , Colitis, Crohn s, Hepatitis A, Hepatitis B, Hepatitis C Endocrine Patient has history of Type II Diabetes Genitourinary Denies history of End Stage Renal Disease Immunological Denies history of Lupus Erythematosus, Raynaud s, Scleroderma Integumentary (Skin) Denies history of History of Burn, History of pressure wounds Musculoskeletal Patient has history of Gout Neurologic Denies history of Dementia, Neuropathy, Quadriplegia, Paraplegia, Seizure Disorder Oncologic Denies history of Received Chemotherapy, Received Radiation Psychiatric Denies history of Anorexia/bulimia, Confinement Anxiety Patient is treated with Insulin, Oral Agents. Blood sugar is tested. Medical And Surgical History Notes Musculoskeletal Right hip surgery Vreeland, Sion A. ([id number]) Review of Systems (ROS) Constitutional Symptoms (General Health) The patient has no complaints or symptoms. Eyes The patient has no complaints or symptoms. Ear/Nose/Mouth/Throat The patient has no complaints or symptoms. Hematologic/Lymphatic The patient has no complaints or symptoms. Respiratory The patient has no complaints or symptoms. Cardiovascular The patient has no complaints or symptoms. Gastrointestinal The patient has no complaints or symptoms. Genitourinary The patient has no complaints or  symptoms. Immunological Denies complaints or symptoms of Hives. Integumentary (Skin) Complains or has symptoms of Wounds, Swelling. Musculoskeletal The patient has no complaints or symptoms. Neurologic The patient has no complaints or symptoms. Oncologic The patient has no complaints or symptoms. Psychiatric The patient has no complaints or symptoms. Objective Constitutional Sitting or standing Blood Pressure is within target range for patient.. Pulse regular and within target range for patient.Marland Kitchen Respirations regular, non-labored and within target range.. Temperature is normal and within the target range for the patient.. Patient's appearance is neat and clean. Appears in no acute distress. Well nourished and well developed.. Vitals Time Taken: 2:15 PM, Height: 72 in, Source: Stated, Weight: 223 lbs, Source: Measured, BMI: 30.2, Temperature: 98.1 F, Pulse: 87 bpm, Respiratory Rate: 16 breaths/min, Blood Pressure: 139/77 mmHg. Respiratory Respiratory effort is easy and symmetric bilaterally. Rate is normal at rest and on room air.. Bilateral breath sounds are clear and equal in all lobes with no wheezes, rales or rhonchi.Roberta Farleigh, Cadyn A. ([id number]) Cardiovascular Heart rhythm and rate regular,  without murmur or gallop.. Femoral arteries without bruits and pulses strong.. Pedal pulses palpable and strong bilaterally.. Lymphatic None palpable in the popliteal or inguinal areas. Neurological Very abnormal sensation to light touch in the right foot. Psychiatric No evidence of depression, anxiety, or agitation. Calm, cooperative, and communicative. Appropriate interactions and affect.. General Notes: Wound exam; the patient has a ulcer on the plantar aspect of his right foot which I think is over the fourth metatarsal head rather than the third. He has had notable surgery roughly at the level of the second metatarsal. He has deformity of the fourth toe which curves under the third  toe. This toe also has what appears to be an ulcer at the base with perhaps surrounding tinea pedis. He uses a prosthesis to keep his toes outlined. The ulcer itself as thick surrounding callus and fibrinous surface slough over the base Integumentary (Hair, Skin) No obvious skin rashes or issue other issues. Wound #1 status is Open. Original cause of wound was Gradually Appeared. The wound is located on the Plantar Foot. The wound measures 1.4cm length x 1.5cm width x 0.4cm depth; 1.649cm^2 area and 0.66cm^3 volume. The wound is limited to skin breakdown. There is no tunneling or undermining noted. There is a large amount of serosanguineous drainage noted. The wound margin is distinct with the outline attached to the wound base. There is large (67-100%) red granulation within the wound bed. The periwound skin appearance exhibited: Callus, Maceration, Moist. The periwound skin appearance did not exhibit: Crepitus, Excoriation, Fluctuance, Friable, Induration, Localized Edema, Rash, Scarring, Dry/Scaly, Atrophie Blanche, Cyanosis, Ecchymosis, Hemosiderin Staining, Mottled, Pallor, Rubor, Erythema. Periwound temperature was noted as No Abnormality. The periwound has tenderness on palpation. Wound #2 status is Open. Original cause of wound was Gradually Appeared. The wound is located on the Right,Plantar Toe Fourth. The wound measures 0.3cm length x 2cm width x 0.1cm depth; 0.471cm^2 area and 0.047cm^3 volume. The wound is limited to skin breakdown. There is no tunneling or undermining noted. There is a large amount of serosanguineous drainage noted. The wound margin is distinct with the outline attached to the wound base. There is large (67-100%) pink, pale granulation within the wound bed. There is no necrotic tissue within the wound bed. The periwound skin appearance exhibited: Maceration, Moist. The periwound skin appearance did not exhibit: Callus, Crepitus, Excoriation, Fluctuance,  Friable, Induration, Localized Edema, Rash, Scarring, Dry/Scaly, Atrophie Blanche, Cyanosis, Ecchymosis, Hemosiderin Staining, Mottled, Pallor, Rubor, Erythema. Periwound temperature was noted as No Abnormality. [name], [name] ([id number]) Assessment Active Problems ICD-10 E11.621 - Type 2 diabetes mellitus with foot ulcer E11.42 - Type 2 diabetes mellitus with diabetic polyneuropathy Procedures Wound #1 Wound #1 is a Diabetic Wound/Ulcer of the Lower Extremity located on the Plantar Foot . There was a Skin/Subcutaneous Tissue Debridement [id number]) debridement with total area of 2.1 sq cm performed by Ricard Dillon, MD. with the following instrument(s): Blade, Curette, Forceps, and Scissors to remove Non-Viable tissue/material including Exudate, Fibrin/Slough, Callus, and Subcutaneous after achieving pain control using Lidocaine 4% Topical Solution. A time out was conducted prior to the start of the procedure. A Minimum amount of bleeding was controlled with Pressure. The procedure was tolerated well with a pain level of 0 throughout and a pain level of 0 following the procedure. Post Debridement Measurements: 1.4cm length x 1.5cm width x 0.2cm depth; 0.33cm^3 volume. Post procedure Diagnosis Wound #1: Same as Pre-Procedure Plan Wound Cleansing: Wound #1 Plantar Foot: Cleanse wound with mild soap and water May  Shower, gently pat wound dry prior to applying new dressing. May shower with protection. Wound #2 Right,Plantar Toe Fourth: Cleanse wound with mild soap and water May Shower, gently pat wound dry prior to applying new dressing. May shower with protection. Skin Barriers/Peri-Wound Care: Wound #2 Right,Plantar Toe Fourth: Antifungal powder Primary Wound Dressing: Wound #1 Plantar Foot: Prisma Ag Wound #2 Right,Plantar Toe Fourth: [name], [name] ([id number]) Aquacel Ag Secondary Dressing: Wound #1 Plantar Foot: Gauze and Kerlix/Conform Wound #2 Right,Plantar Toe  Fourth: Gauze and Kerlix/Conform Dressing Change Frequency: Wound #1 Plantar Foot: Change dressing every day. Wound #2 Right,Plantar Toe Fourth: Change dressing every day. Follow-up Appointments: Wound #1 Plantar Foot: Return Appointment in 1 week. Wound #2 Right,Plantar Toe Fourth: Return Appointment in 1 week. Off-Loading: Wound #1 Plantar Foot: Open toe surgical shoe to: - Front offloader darco Wound #2 Right,Plantar Toe Fourth: Open toe surgical shoe to: - Engineer, mining Orders / Instructions: Wound #1 Plantar Foot: Increase protein intake. Activity as tolerated Wound #2 Right,Plantar Toe Fourth: Increase protein intake. Activity as tolerated Radiology ordered were: X-ray, foot - A/P lateral of right foot #1 there are actually 2 ulcers here 1 which I think is between the fourth and third metatarsal heads and a second one at the base of the right fourth toe which is self is a deformed toes circling under the third toe #2 extensive debridement done of the ulcer on the metatarsal head removing circumferential callus nonviable subcutaneous tissue and fibrinous surface slough for. This was done with both a scalpel and a curet. #3 no evidence of infection or ischemia is seen #4 we use Prisma to the metatarsal wound silver alginate to the wound at the base of the fourth toe, Kerlix and Conform #5 I ordered an x-ray of the foot, he has not had one since 2015 and has had an ulcer on the right foot for several months now. Electronic Signature(s) [name], [name] ([id number]) Signed: 12/15/2015 7:52:30 AM By: Linton Ham MD Entered By: Linton Ham on 12/14/2015 17:41:51 [place], Gerda Diss ([id number]) -------------------------------------------------------------------------------- ROS/PFSH Details Patient Name: [name], Laine A. Date of Service: 12/14/2015 2:15 PM Medical Record Patient Account Number: [id number] Number: Treating RN: Baruch Gouty, RN, BSN,  Rita Dec 04, 1955 [phone] y.o. Other Clinician: Date of Birth/Sex: Male) Treating Sharad Vaneaton, [place] Primary Care Physician: Viviana Simpler Physician/Extender: G Referring Physician: Marily Memos in Treatment: 0 Information Obtained From Patient Caregiver Wound History Do you currently have one or more open woundso Yes How many open wounds do you currently haveo 1 Approximately how long have you had your woundso 4 months How have you been treating your wound(s) until nowo neosporin Has your wound(s) ever healed and then re-openedo No Have you had any lab work done in the past montho No Have you tested positive for an antibiotic resistant organism (MRSA, VRE)o No Have you tested positive for osteomyelitis (bone infection)o No Have you had any tests for circulation on your legso No Immunological Complaints and Symptoms: Negative for: Hives Medical History: Negative for: Lupus Erythematosus; Raynaudos; Scleroderma Integumentary (Skin) Complaints and Symptoms: Positive for: Wounds; Swelling Medical History: Negative for: History of Burn; History of pressure wounds Constitutional Symptoms (General Health) Complaints and Symptoms: No Complaints or Symptoms Eyes Complaints and Symptoms: No Complaints or Symptoms Medical History: Borromeo, JAMARII RAHL. ([id number]) Negative for: Cataracts Ear/Nose/Mouth/Throat Complaints and Symptoms: No Complaints or Symptoms Medical History: Negative for: Chronic sinus problems/congestion; Middle ear problems Hematologic/Lymphatic Complaints and Symptoms: No Complaints or Symptoms Medical History:  Negative for: Anemia; Hemophilia; Human Immunodeficiency Virus; Lymphedema; Sickle Cell Disease Respiratory Complaints and Symptoms: No Complaints or Symptoms Medical History: Negative for: Aspiration; Asthma; Chronic Obstructive Pulmonary Disease (COPD); Pneumothorax; Sleep Apnea; Tuberculosis Cardiovascular Complaints and Symptoms: No Complaints  or Symptoms Medical History: Positive for: Hypertension Negative for: Angina; Arrhythmia; Congestive Heart Failure; Coronary Artery Disease; Deep Vein Thrombosis; Hypotension; Myocardial Infarction; Peripheral Arterial Disease; Peripheral Venous Disease; Phlebitis; Vasculitis Gastrointestinal Complaints and Symptoms: No Complaints or Symptoms Medical History: Negative for: Cirrhosis ; Colitis; Crohnos; Hepatitis A; Hepatitis B; Hepatitis C Endocrine Medical History: Positive for: Type II Diabetes Treated with: Insulin, Oral agents Whichard, Rolla A. ([id number]) Blood sugar tested every day: Yes Tested : Genitourinary Complaints and Symptoms: No Complaints or Symptoms Medical History: Negative for: End Stage Renal Disease Musculoskeletal Complaints and Symptoms: No Complaints or Symptoms Medical History: Positive for: Gout Past Medical History Notes: Right hip surgery Neurologic Complaints and Symptoms: No Complaints or Symptoms Medical History: Negative for: Dementia; Neuropathy; Quadriplegia; Paraplegia; Seizure Disorder Oncologic Complaints and Symptoms: No Complaints or Symptoms Medical History: Negative for: Received Chemotherapy; Received Radiation Psychiatric Complaints and Symptoms: No Complaints or Symptoms Medical History: Negative for: Anorexia/bulimia; Confinement Anxiety Family and Social History Cancer: No; Diabetes: Yes - Siblings, Maternal Grandparents, Mother; Heart Disease: Yes - Father, Siblings; Hereditary Spherocytosis: No; Hypertension: No; Kidney Disease: Yes - Mother; Lung Disease: No; Seizures: No; Stroke: Yes - Mother, Siblings; Thyroid Problems: No; Tuberculosis: No; Never smoker; Marital Status - Married; Alcohol Use: Rarely; Drug Use: No History; Caffeine Use: Daily; Financial Concerns: No; Food, Clothing or Shelter Needs: No; Support System Lacking: No; Transportation Droke, [place] A. ([id number]) Concerns: No; Advanced Directives: No;  Patient does not want information on Advanced Directives; Living Will: No Electronic Signature(s) Signed: 12/14/2015 3:08:39 PM By: Regan Lemming BSN, RN Signed: 12/15/2015 7:52:30 AM By: Linton Ham MD Entered By: Regan Lemming on 12/14/2015 15:08:37 Pitre, Gerda Diss ([id number]) -------------------------------------------------------------------------------- SuperBill Details Patient Name: Krempasky, Gaylin A. Date of Service: 12/14/2015 Medical Record Patient Account Number: [id number] Number: Treating RN: Baruch Gouty, RN, BSN, Rita 1955/12/22 [phone] y.o. Other Clinician: Date of Birth/Sex: Male) Treating Mykira Hofmeister, [place] Primary Care Physician: Viviana Simpler Physician/Extender: G Referring Physician: Marily Memos in Treatment: 0 Diagnosis Coding ICD-10 Codes Code Description E11.621 Type 2 diabetes mellitus with foot ulcer E11.42 Type 2 diabetes mellitus with diabetic polyneuropathy Facility Procedures CPT4 Code: [id number] Description: Lydia VISIT-LEV 3 EST PT Modifier: Quantity: 1 CPT4 Code: [id number] Description: [id number] - DEB SUBQ TISSUE 20 SQ CM/< ICD-10 Description Diagnosis E11.621 Type 2 diabetes mellitus with foot ulcer Modifier: Quantity: 1 Physician Procedures CPT4 Code: [id number] Description: [id number] - WC PHYS LEVEL 4 - NEW PT ICD-10 Description Diagnosis E11.621 Type 2 diabetes mellitus with foot ulcer Modifier: Quantity: 1 CPT4 Code: [id number] Description: [id number] - WC PHYS SUBQ TISS 20 SQ CM ICD-10 Description Diagnosis E11.621 Type 2 diabetes mellitus with foot ulcer Modifier: Quantity: 1 Electronic Signature(s) Signed: 12/15/2015 7:52:30 AM By: Linton Ham MD Entered By: Linton Ham on 12/14/2015 17:42:48
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
## 2015-12-21 ENCOUNTER — Other Ambulatory Visit: Payer: Self-pay | Admitting: Internal Medicine

## 2015-12-21 ENCOUNTER — Ambulatory Visit
Admission: RE | Admit: 2015-12-21 | Discharge: 2015-12-21 | Disposition: A | Discharge status: Home / Self Care | Payer: 59 | Source: Ambulatory Visit | Attending: Internal Medicine | Admitting: Internal Medicine

## 2015-12-21 ENCOUNTER — Encounter: Payer: 59 | Admitting: Internal Medicine

## 2015-12-21 DIAGNOSIS — M869 Osteomyelitis, unspecified: Secondary | ICD-10-CM

## 2015-12-21 DIAGNOSIS — L97511 Non-pressure chronic ulcer of other part of right foot limited to breakdown of skin: Secondary | ICD-10-CM | POA: Insufficient documentation

## 2015-12-21 DIAGNOSIS — E11621 Type 2 diabetes mellitus with foot ulcer: Secondary | ICD-10-CM | POA: Insufficient documentation

## 2015-12-21 DIAGNOSIS — M109 Gout, unspecified: Secondary | ICD-10-CM | POA: Diagnosis not present

## 2015-12-23 NOTE — Progress Notes (Signed)
[name], [name] ([id number]) Visit Report for 12/21/2015 Arrival Information Details Patient Name: [name], [name] Date of Service: 12/21/2015 2:15 PM Medical Record Number: [id number] Patient Account Number: [id number] Date of Birth/Sex: 03-27-1956 (60 y.o. Male) Treating RN: Afful, RN, BSN, Velva Harman Primary Care Physician: Viviana Simpler Other Clinician: Referring Physician: Viviana Simpler Treating Physician/Extender: Tito Dine in Treatment: 1 Visit Information History Since Last Visit Added or deleted any medications: No Patient Arrived: Ambulatory Any new allergies or adverse reactions: No Arrival Time: 14:21 Had a fall or experienced change in No Accompanied By: wife activities of daily living that may affect Transfer Assistance: None risk of falls: Patient Identification Verified: Yes Signs or symptoms of abuse/neglect since last No Secondary Verification Process Yes visito Completed: Hospitalized since last visit: No Patient Requires Transmission-Based No Has Dressing in Place as Prescribed: Yes Precautions: Has Footwear/Offloading in Place as Yes Patient Has Alerts: Yes Prescribed: Patient Alerts: HgbA1c Right: Wedge 7.0 Shoe Pain Present Now: No Electronic Signature(s) Signed: 12/21/2015 3:28:31 PM By: Regan Lemming BSN, RN Entered By: Regan Lemming on 12/21/2015 14:22:07 [name], [name] ([id number]) -------------------------------------------------------------------------------- Encounter Discharge Information Details Patient Name: [name], [name] Date of Service: 12/21/2015 2:15 PM Medical Record Number: [id number] Patient Account Number: [id number] Date of Birth/Sex: [birth year] (60 y.o. Male) Treating RN: Afful, RN, BSN, Velva Harman Primary Care Physician: Viviana Simpler Other Clinician: Referring Physician: Viviana Simpler Treating Physician/Extender: Tito Dine in Treatment: 1 Encounter Discharge Information Items Discharge Pain Level:  0 Discharge Condition: Stable Ambulatory Status: Ambulatory Discharge Destination: Home Transportation: Private Auto Accompanied By: wife Schedule Follow-up Appointment: No Medication Reconciliation completed and provided to Patient/Care No [place]: Provided on Clinical Summary of Care: 12/21/2015 Form Type Recipient Paper Patient JW Electronic Signature(s) Signed: 12/21/2015 2:53:14 PM By: Ruthine Dose Entered By: Ruthine Dose on 12/21/2015 14:53:14 [place], [name] ([id number]) -------------------------------------------------------------------------------- Lower Extremity Assessment Details Patient Name: [name], [name] Date of Service: 12/21/2015 2:15 PM Medical Record Number: [id number] Patient Account Number: [id number] Date of Birth/Sex: 05/21/1956 (60 y.o. Male) Treating RN: Afful, RN, BSN, Velva Harman Primary Care Physician: Viviana Simpler Other Clinician: Referring Physician: Viviana Simpler Treating Physician/Extender: Tito Dine in Treatment: 1 Vascular Assessment Pulses: Posterior Tibial Dorsalis Pedis Palpable: [Right:Yes] Extremity colors, hair growth, and conditions: Extremity Color: [Right:Normal] Hair Growth on Extremity: [Right:Yes] Temperature of Extremity: [Right:Warm] Capillary Refill: [Right:< 3 seconds] Toe Nail Assessment Left: Right: Thick: No Discolored: No Deformed: No Improper Length and Hygiene: No Electronic Signature(s) Signed: 12/21/2015 3:28:31 PM By: Regan Lemming BSN, RN Entered By: Regan Lemming on 12/21/2015 14:23:09 [name], [name] ([id number]) -------------------------------------------------------------------------------- Multi Wound Chart Details Patient Name: [name], [name] Date of Service: 12/21/2015 2:15 PM Medical Record Number: [id number] Patient Account Number: [id number] Date of Birth/Sex: 04-10-1956 (60 y.o. Male) Treating RN: Afful, RN, BSN, Velva Harman Primary Care Physician: Viviana Simpler Other Clinician: Referring  Physician: Viviana Simpler Treating Physician/Extender: Tito Dine in Treatment: 1 Vital Signs Height(in): 72 Pulse(bpm): 89 Weight(lbs): 223 Blood Pressure 145/78 (mmHg): Body Mass Index(BMI): 30 Temperature(F): 98.2 Respiratory Rate 17 (breaths/min): Photos: [1:No Photos] [2:No Photos] [N/A:N/A] Wound Location: [1:Foot - Plantar] [2:Right Toe Fourth - Plantar N/A] Wounding Event: [1:Gradually Appeared] [2:Gradually Appeared] [N/A:N/A] Primary Etiology: [1:Diabetic Wound/Ulcer of the Lower Extremity] [2:Diabetic Wound/Ulcer of N/A the Lower Extremity] Comorbid History: [1:Hypertension, Type II Diabetes, Gout] [2:Hypertension, Type II Diabetes, Gout] [N/A:N/A] Date Acquired: [1:08/03/2015] [2:08/03/2015] [N/A:N/A] Weeks of Treatment: [1:1] [2:1] [N/A:N/A] Wound Status: [1:Open] [2:Healed - Epithelialized] [N/A:N/A] Measurements L x W x  D 1.3x1.3x0.3 [2:0x0x0] [N/A:N/A] (cm) Area (cm) : [1:1.327] [2:0] [N/A:N/A] Volume (cm) : [1:0.398] [2:0] [N/A:N/A] % Reduction in Area: [1:19.50%] [2:100.00%] [N/A:N/A] % Reduction in Volume: 39.70% [2:100.00%] [N/A:N/A] Classification: [1:Grade 1] [2:Grade 1] [N/A:N/A] Exudate Amount: [1:Medium] [2:None Present] [N/A:N/A] Exudate Type: [1:Serosanguineous] [2:N/A] [N/A:N/A] Exudate Color: [1:red, brown] [2:N/A] [N/A:N/A] Wound Margin: [1:Distinct, outline attached] [2:Distinct, outline attached] [N/A:N/A] Granulation Amount: [1:Large (67-100%)] [2:None Present (0%)] [N/A:N/A] Granulation Quality: [1:Red] [2:N/A] [N/A:N/A] Necrotic Amount: [1:Small (1-33%)] [2:None Present (0%)] [N/A:N/A] Exposed Structures: [1:Fascia: No Fat: No Tendon: No Muscle: No Joint: No Bone: No] [2:Fascia: No Fat: No Tendon: No Muscle: No Joint: No Bone: No] [N/A:N/A] Limited to Skin Limited to Skin Breakdown Breakdown Epithelialization: None Large (67-100%) N/A Periwound Skin Texture: Callus: Yes Edema: No N/A Edema: No Excoriation:  No Excoriation: No Induration: No Induration: No Callus: No Crepitus: No Crepitus: No Fluctuance: No Fluctuance: No Friable: No Friable: No Rash: No Rash: No Scarring: No Scarring: No Periwound Skin Maceration: Yes Maceration: No N/A Moisture: Moist: Yes Moist: No Dry/Scaly: No Dry/Scaly: No Periwound Skin Color: Atrophie Blanche: No Atrophie Blanche: No N/A Cyanosis: No Cyanosis: No Ecchymosis: No Ecchymosis: No Erythema: No Erythema: No Hemosiderin Staining: No Hemosiderin Staining: No Mottled: No Mottled: No Pallor: No Pallor: No Rubor: No Rubor: No Temperature: No Abnormality No Abnormality N/A Tenderness on Yes No N/A Palpation: Wound Preparation: Ulcer Cleansing: Ulcer Cleansing: N/A Rinsed/Irrigated with Rinsed/Irrigated with Saline Saline Topical Anesthetic Topical Anesthetic Applied: Other: lidocaine Applied: None 4% Treatment Notes Electronic Signature(s) Signed: 12/21/2015 3:28:31 PM By: Regan Lemming BSN, RN Entered By: Regan Lemming on 12/21/2015 14:41:37 [name], [name] ([id number]) -------------------------------------------------------------------------------- [place] Details Patient Name: [name], [name] Date of Service: 12/21/2015 2:15 PM Medical Record Number: [id number] Patient Account Number: [id number] Date of Birth/Sex: May 12, 1956 (60 y.o. Male) Treating RN: Afful, RN, BSN, Velva Harman Primary Care Physician: Viviana Simpler Other Clinician: Referring Physician: Viviana Simpler Treating Physician/Extender: Tito Dine in Treatment: 1 Active Inactive Abuse / Safety / Falls / Self Care Management Nursing Diagnoses: Impaired home maintenance Impaired physical mobility Knowledge deficit related to abuse or neglect Knowledge deficit related to: safety; personal, health (wound), emergency Potential for falls Goals: Patient will remain injury free Date Initiated: 12/14/2015 Goal Status:  Active Patient/caregiver will verbalize understanding of skin care regimen Date Initiated: 12/14/2015 Goal Status: Active Patient/caregiver will verbalize/demonstrate measure taken to improve self care Date Initiated: 12/14/2015 Goal Status: Active Patient/caregiver will verbalize/demonstrate measures taken to improve the patient's personal safety Date Initiated: 12/14/2015 Goal Status: Active Patient/caregiver will verbalize/demonstrate measures taken to prevent injury and/or falls Date Initiated: 12/14/2015 Goal Status: Active Patient/caregiver will verbalize/demonstrate understanding of what to do in case of emergency Date Initiated: 12/14/2015 Goal Status: Active Interventions: Assess fall risk on admission and as needed Assess: immobility, friction, shearing, incontinence upon admission and as needed Assess impairment of mobility on admission and as needed per policy Assess self care needs on admission and as needed Patient referred to community resources (specify in notes) Coval, PROMETHEUS CRONEY. ([id number]) Provide education on basic hygiene Provide education on personal and home safety Provide education on safe transfers Provide education on vaccinations Treatment Activities: Education provided on Basic Hygiene : 12/14/2015 Notes: Orientation to the Wound Care Program Nursing Diagnoses: Knowledge deficit related to the wound healing center program Goals: Patient/caregiver will verbalize understanding of the [facility] Date Initiated: 12/14/2015 Goal Status: Active Interventions: Provide education on orientation to the wound center Notes: Peripheral Neuropathy Nursing Diagnoses: Knowledge deficit  related to disease process and management of peripheral neurovascular dysfunction Potential alteration in peripheral tissue perfusion (select prior to confirmation of diagnosis) Goals: Patient/caregiver will verbalize understanding of disease process and disease  management Date Initiated: 12/14/2015 Goal Status: Active Interventions: Assess signs and symptoms of neuropathy upon admission and as needed Provide education on Management of Neuropathy and Related Ulcers Provide education on Management of Neuropathy upon discharge from the [facility]: Patient referred for customized footwear/offloading : 12/14/2015 Notes: Wound/Skin Impairment [name], [name] ([id number]) Nursing Diagnoses: Impaired tissue integrity Knowledge deficit related to ulceration/compromised skin integrity Goals: Patient/caregiver will verbalize understanding of skin care regimen Date Initiated: 12/14/2015 Goal Status: Active Ulcer/skin breakdown will have a volume reduction of 30% by week 4 Date Initiated: 12/14/2015 Goal Status: Active Ulcer/skin breakdown will have a volume reduction of 50% by week 8 Date Initiated: 12/14/2015 Goal Status: Active Ulcer/skin breakdown will have a volume reduction of 80% by week 12 Date Initiated: 12/14/2015 Goal Status: Active Ulcer/skin breakdown will heal within 14 weeks Date Initiated: 12/14/2015 Goal Status: Active Interventions: Assess patient/caregiver ability to obtain necessary supplies Assess patient/caregiver ability to perform ulcer/skin care regimen upon admission and as needed Assess ulceration(s) every visit Provide education on ulcer and skin care Treatment Activities: Referred to DME Mariluz Crespo for dressing supplies : 12/14/2015 Skin care regimen initiated : 12/14/2015 Topical wound management initiated : 12/14/2015 Notes: Electronic Signature(s) Signed: 12/21/2015 3:28:31 PM By: Regan Lemming BSN, RN Entered By: Regan Lemming on 12/21/2015 14:41:26 Weich, [name] ([id number]) -------------------------------------------------------------------------------- Pain Assessment Details Patient Name: [name], [name] Date of Service: 12/21/2015 2:15 PM Medical Record Number: [id number] Patient Account Number:  [id number] Date of Birth/Sex: 1956/05/16 (60 y.o. Male) Treating RN: Afful, RN, BSN, Velva Harman Primary Care Physician: Viviana Simpler Other Clinician: Referring Physician: Viviana Simpler Treating Physician/Extender: Tito Dine in Treatment: 1 Active Problems Location of Pain Severity and Description of Pain Patient Has Paino No Site Locations With Dressing Change: No Pain Management and Medication Current Pain Management: Electronic Signature(s) Signed: 12/21/2015 3:28:31 PM By: Regan Lemming BSN, RN Entered By: Regan Lemming on 12/21/2015 14:22:13 [name], [name] ([id number]) -------------------------------------------------------------------------------- Patient/Caregiver Education Details Patient Name: [name], Rudell A. Date of Service: 12/21/2015 2:15 PM Medical Record Number: [id number] Patient Account Number: [id number] Date of Birth/Gender: 11-22-1955 (60 y.o. Male) Treating RN: Afful, RN, BSN, Velva Harman Primary Care Physician: Viviana Simpler Other Clinician: Referring Physician: Viviana Simpler Treating Physician/Extender: Tito Dine in Treatment: 1 Education Assessment Education Provided To: Patient Education Topics Provided Basic Hygiene: Methods: Explain/Verbal Responses: State content correctly Peripheral Neuropathy: Methods: Explain/Verbal Responses: State content correctly Safety: Methods: Explain/Verbal Responses: State content correctly Tissue Oxygenation: Methods: Explain/Verbal Responses: State content correctly Welcome To [facility]: Methods: Explain/Verbal Responses: State content correctly Wound/Skin Impairment: Methods: Explain/Verbal Responses: State content correctly Electronic Signature(s) Signed: 12/21/2015 3:28:31 PM By: Regan Lemming BSN, RN Entered By: Regan Lemming on 12/21/2015 14:46:09 Javed, [name] ([id number]) -------------------------------------------------------------------------------- Wound Assessment  Details Patient Name: Acuna, Azariel A. Date of Service: 12/21/2015 2:15 PM Medical Record Number: [id number] Patient Account Number: [id number] Date of Birth/Sex: 1955/11/15 (60 y.o. Male) Treating RN: Afful, RN, BSN, Velva Harman Primary Care Physician: Viviana Simpler Other Clinician: Referring Physician: Viviana Simpler Treating Physician/Extender: Tito Dine in Treatment: 1 Wound Status Wound Number: 1 Primary Diabetic Wound/Ulcer of the Lower Etiology: Extremity Wound Location: Foot - Plantar Wound Status: Open Wounding Event: Gradually Appeared Comorbid Hypertension, Type II Diabetes, Date Acquired: 08/03/2015 History: Gout Weeks Of Treatment:  1 Clustered Wound: No Photos Photo Uploaded By: Regan Lemming on 12/21/2015 15:22:08 Wound Measurements Length: (cm) 1.3 Width: (cm) 1.3 Depth: (cm) 0.3 Area: (cm) 1.327 Volume: (cm) 0.398 % Reduction in Area: 19.5% % Reduction in Volume: 39.7% Epithelialization: None Tunneling: No Undermining: No Wound Description Classification: Grade 1 Wound Margin: Distinct, outline attached Exudate Amount: Medium Exudate Type: Serosanguineous Exudate Color: red, brown Foul Odor After Cleansing: No Wound Bed Granulation Amount: Large (67-100%) Exposed Structure Granulation Quality: Red Fascia Exposed: No Necrotic Amount: Small (1-33%) Fat Layer Exposed: No Necrotic Quality: Adherent Slough Tendon Exposed: No Crable, Craven A. ([id number]) Muscle Exposed: No Joint Exposed: No Bone Exposed: No Limited to Skin Breakdown Periwound Skin Texture Texture Color No Abnormalities Noted: No No Abnormalities Noted: No Callus: Yes Atrophie Blanche: No Crepitus: No Cyanosis: No Excoriation: No Ecchymosis: No Fluctuance: No Erythema: No Friable: No Hemosiderin Staining: No Induration: No Mottled: No Localized Edema: No Pallor: No Rash: No Rubor: No Scarring: No Temperature / Pain Moisture Temperature: No Abnormality No  Abnormalities Noted: No Tenderness on Palpation: Yes Dry / Scaly: No Maceration: Yes Moist: Yes Wound Preparation Ulcer Cleansing: Rinsed/Irrigated with Saline Topical Anesthetic Applied: Other: lidocaine 4%, Treatment Notes Wound #1 (Plantar Foot) 1. Cleansed with: Clean wound with Normal Saline 4. Dressing Applied: Prisma Ag 5. Secondary Dressing Applied Gauze and Kerlix/Conform 6. Footwear/Offloading device applied Surgical shoe 7. Secured with Recruitment consultant) Signed: 12/21/2015 3:28:31 PM By: Regan Lemming BSN, RN Entered By: Regan Lemming on 12/21/2015 14:40:45 Jeangilles, [name] ([id number]) -------------------------------------------------------------------------------- Wound Assessment Details Patient Name: Humiston, Egbert A. Date of Service: 12/21/2015 2:15 PM Medical Record Number: [id number] Patient Account Number: [id number] Date of Birth/Sex: 1956-05-13 (60 y.o. Male) Treating RN: Afful, RN, BSN, Velva Harman Primary Care Physician: Viviana Simpler Other Clinician: Referring Physician: Viviana Simpler Treating Physician/Extender: Tito Dine in Treatment: 1 Wound Status Wound Number: 2 Primary Diabetic Wound/Ulcer of the Lower Etiology: Extremity Wound Location: Right Toe Fourth - Plantar Wound Status: Healed - Epithelialized Wounding Event: Gradually Appeared Comorbid Hypertension, Type II Diabetes, Date Acquired: 08/03/2015 History: Gout Weeks Of Treatment: 1 Clustered Wound: No Photos Photo Uploaded By: Regan Lemming on 12/21/2015 15:22:09 Wound Measurements Length: (cm) 0 % Reducti Width: (cm) 0 % Reducti Depth: (cm) 0 Epithelia Area: (cm) 0 Tunnelin Volume: (cm) 0 Undermin on in Area: 100% on in Volume: 100% lization: Large (67-100%) g: No ing: No Wound Description Classification: Grade 1 Wound Margin: Distinct, outline attached Exudate Amount: None Present Foul Odor After Cleansing: No Wound Bed Granulation Amount: None Present  (0%) Exposed Structure Necrotic Amount: None Present (0%) Fascia Exposed: No Fat Layer Exposed: No Tendon Exposed: No Muscle Exposed: No Joint Exposed: No Lingle, Lucile A. ([id number]) Bone Exposed: No Limited to Skin Breakdown Periwound Skin Texture Texture Color No Abnormalities Noted: No No Abnormalities Noted: No Callus: No Atrophie Blanche: No Crepitus: No Cyanosis: No Excoriation: No Ecchymosis: No Fluctuance: No Erythema: No Friable: No Hemosiderin Staining: No Induration: No Mottled: No Localized Edema: No Pallor: No Rash: No Rubor: No Scarring: No Temperature / Pain Moisture Temperature: No Abnormality No Abnormalities Noted: No Dry / Scaly: No Maceration: No Moist: No Wound Preparation Ulcer Cleansing: Rinsed/Irrigated with Saline Topical Anesthetic Applied: None Electronic Signature(s) Signed: 12/21/2015 3:28:31 PM By: Regan Lemming BSN, RN Entered By: Regan Lemming on 12/21/2015 14:41:17 Netzer, [name] ([id number]) -------------------------------------------------------------------------------- Vitals Details Patient Name: Yokum, Goebel A. Date of Service: 12/21/2015 2:15 PM Medical Record Number: [id number] Patient Account Number: [id number] Date of Birth/Sex:  Apr 01, 1956 (61 y.o. Male) Treating RN: Afful, RN, BSN, Velva Harman Primary Care Physician: Viviana Simpler Other Clinician: Referring Physician: Viviana Simpler Treating Physician/Extender: Tito Dine in Treatment: 1 Vital Signs Time Taken: 14:22 Temperature (F): 98.2 Height (in): 72 Pulse (bpm): 89 Weight (lbs): 223 Respiratory Rate (breaths/min): 17 Body Mass Index (BMI): 30.2 Blood Pressure (mmHg): 145/78 Reference Range: 80 - 120 mg / dl Electronic Signature(s) Signed: 12/21/2015 3:28:31 PM By: Regan Lemming BSN, RN Entered By: Regan Lemming on 12/21/2015 14:22:34

## 2015-12-23 NOTE — Progress Notes (Signed)
[name], Bradley CAPUA ([id number]) Visit Report for 12/21/2015 Chief Complaint Document Details Patient Name: [name], [name] Date of Service: 12/21/2015 2:15 PM Medical Record Patient Account Number: [id number] Number: Treating RN: Bradley Gouty, RN, BSN, Bradley [birth year] [phone] y.o. Other Clinician: Date of Birth/Sex: Male) Treating ROBSON, [name] Primary Care Physician: [name] Physician/Extender: G Referring Physician: Marily [name] in Treatment: 1 Information Obtained from: Patient Chief Complaint 12/14/15; this is a 60 year old man who is a type II diabetic with diabetic neuropathy on insulin and metformin. Presents with a nonhealing ulcer on his right foot since January of this year Electronic Signature(s) Signed: 12/22/2015 5:25:36 PM By: Bradley Ham MD Entered By: [name] on 12/21/2015 14:52:28 [name], [name] ([id number]) -------------------------------------------------------------------------------- Debridement Details Patient Name: [name], [name] Date of Service: 12/21/2015 2:15 PM Medical Record Patient Account Number: [id number] Number: Treating RN: Bradley Gouty, RN, BSN, Bradley Mar 06, 1956 [phone] y.o. Other Clinician: Date of Birth/Sex: Male) Treating ROBSON, [name] Primary Care Physician: [name] Physician/Extender: G Referring Physician: Marily [name] in Treatment: 1 Debridement Performed for Wound #1 Plantar Foot Assessment: Performed By: Physician Bradley Dillon, MD Debridement: Debridement Pre-procedure Yes Verification/Time Out Taken: Start Time: 14:38 Pain Control: Lidocaine 4% Topical Solution Level: Skin/Subcutaneous Tissue Total Area Debrided (L x 1.3 (cm) x 1.3 (cm) = 1.69 (cm) W): Tissue and other Callus, Fibrin/Slough, Subcutaneous material debrided: Instrument: Curette Bleeding: Minimum Hemostasis Achieved: Pressure End Time: 14:41 Procedural Pain: 0 Post Procedural Pain: 0 Response to Treatment:  Procedure was tolerated well Post Debridement Measurements of Total Wound Length: (cm) 1.3 Width: (cm) 1.3 Depth: (cm) 0.3 Volume: (cm) 0.398 Post Procedure Diagnosis Same as Pre-procedure Electronic Signature(s) Signed: 12/21/2015 3:28:31 PM By: [name] BSN, RN Signed: 12/22/2015 5:25:36 PM By: Bradley Ham MD Entered By: [name] on 12/21/2015 14:52:15 [name], [name] ([id number]) -------------------------------------------------------------------------------- HPI Details Patient Name: [name], [name] Date of Service: 12/21/2015 2:15 PM Medical Record Patient Account Number: [id number] Number: Treating RN: Bradley Gouty, RN, BSN, Bradley Sep 22, 1955 [phone] y.o. Other Clinician: Date of Birth/Sex: Male) Treating ROBSON, [name] Primary Care Physician: [name] Physician/Extender: G Referring Physician: Marily [name] in Treatment: 1 History of Present Illness HPI Description: 12/14/15; this is a 60 year old type II diabetic on insulin with diabetic polyneuropathy. He apparently had a callus at roughly his fourth metatarsal head in January he was attempting the. This down and it up with a ulcer. His wife who was present said they have been using saline to clean this wound and using Polysporin and Betadine- but not making much progress. His previous surgery by his podiatrist Dr. Milinda [name] in 2014 but does not wish to go back to see Dr. Milinda [name] and was referred here instead. He has not been on any antibiotics and has not had recent x-rays of the foot. He has no known polyneuropathy and no prior wound history looking [facility] it appears that the patient had foot corrective surgery on 04/02/13 this consisted of an [name] bunion repair on the right second metatarsal, osteotomy of the right fifth metatarsal, osteotomy of the right hammertoe repair on #2 #3 in #5. Sometime after this he injured the foot he was noted to have fractures with fracture of the  first metatarsal osteotomy fracture of the second metatarsal osteotomy dislocation of the third metatarsal phalangeal joint and fracture of the fifth metatarsal osteotomy site here and I believe he went back to the operating room and had internal fixation of the right foot. He has not had  any recent x-rays of the right foot. He had an MRI of the right foot in 2014 last hemoglobin A1c I see is at 7.1 on 10/29/15. He had a normal serum albumin on 04/29/15 of 4.8. He does not have any nutritional issues 12/21/15; x-ray of the foot that showed no acute bony abnormality no radiopaque foreign bodies. Postsurgical changes in the right first metatarsal were stable healing of fractures in the distal second metatarsal, old healed right fifth metatarsal fracture cortical erosions noted in the right third and fourth metatarsals. We have been using [name]) Signed: 12/22/2015 5:25:36 PM By: Bradley Ham MD Entered By: [name] on 12/21/2015 14:53:50 [name] ([id number]) -------------------------------------------------------------------------------- Physical Exam Details Patient Name: [name], [name] Date of Service: 12/21/2015 2:15 PM Medical Record Patient Account Number: [id number] Number: Treating RN: Bradley Gouty, RN, BSN, Bradley 30-Mar-1956 [phone] y.o. Other Clinician: Date of Birth/Sex: Male) Treating ROBSON, [name] Primary Care Physician: [name] Physician/Extender: G Referring Physician: Marily [name] in Treatment: 1 Notes Wound exam; plantar aspect of the fourth toe right fourth metatarsal head. The wound at the base of his curving fourth toe appears to were resolved. Wound at the fourth metatarsal head still has thick callus and nonviable subcutaneous tissue which are debridement. The wound bed appears to be reasonably healthy post debridement. Electronic Signature(s) Signed: 12/22/2015 5:25:36 PM By: Bradley Ham MD Entered By: [name] on 12/21/2015 14:55:54 [name], [name] ([id number]) -------------------------------------------------------------------------------- Physician Orders Details Patient Name: [name], [name] Date of Service: 12/21/2015 2:15 PM Medical Record Patient Account Number: [id number] Number: Treating RN: Bradley Gouty, RN, BSN, Bradley 02-Apr-1956 [phone] y.o. Other Clinician: Date of Birth/Sex: Male) Treating ROBSON, [name] Primary Care Physician: [name] Physician/Extender: G Referring Physician: Marily [name] in Treatment: 1 Verbal / Phone Orders: Yes Clinician: Afful, RN, BSN, Bradley Read Back and Verified: Yes Diagnosis Coding Wound Cleansing Wound #1 Plantar Foot o Cleanse wound with mild soap and water o May Shower, gently pat wound dry prior to applying new dressing. o May shower with protection. Primary Wound Dressing Wound #1 Plantar Foot o Prisma Ag Secondary Dressing Wound #1 Plantar Foot o Gauze and Kerlix/Conform Dressing Change Frequency Wound #1 Plantar Foot o Change dressing every other day. Follow-up Appointments Wound #1 Plantar Foot o Return Appointment in 1 week. 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 o Activity as tolerated Electronic Signature(s) Desta, [name] ([id number]) Signed: 12/21/2015 3:28:31 PM By: [name] BSN, RN Signed: 12/22/2015 5:25:36 PM By: Bradley Ham MD Entered By: [name] on 12/21/2015 14:44:01 [name], [name] ([id number]) -------------------------------------------------------------------------------- Problem List Details Patient Name: [name], [name] Date of Service: 12/21/2015 2:15 PM Medical Record Patient Account Number: [id number] Number: Treating RN: Bradley Gouty, RN, BSN, Bradley [birth year] [phone] y.o. Other Clinician: Date of Birth/Sex: Male) Treating  ROBSON, [name] Primary Care Physician: [name] Physician/Extender: G Referring Physician: Marily [name] in Treatment: 1 Active Problems ICD-10 Encounter Code Description Active Date Diagnosis E11.621 Type 2 diabetes mellitus with foot ulcer 12/14/2015 Yes E11.42 Type 2 diabetes mellitus with diabetic polyneuropathy 12/14/2015 Yes Inactive Problems Resolved Problems Electronic Signature(s) Signed: 12/22/2015 5:25:36 PM By: Bradley Ham MD Entered By: [name] on 12/21/2015 14:51:59 Kjos, [name] ([id number]) -------------------------------------------------------------------------------- Progress Note Details Patient Name: [name], [name] Date of Service: 12/21/2015 2:15 PM Medical Record Patient Account Number: [id number] Number: Treating RN:  Afful, RN, BSN, Velva Harman [birth year] [phone] y.o. Other Clinician: Date of Birth/Sex: Male) Treating ROBSON, [name] Primary Care Physician: [name] Physician/Extender: G Referring Physician: Marily [name] in Treatment: 1 Subjective Chief Complaint Information obtained from Patient 12/14/15; this is a 60 year old man who is a type II diabetic with diabetic neuropathy on insulin and metformin. Presents with a nonhealing ulcer on his right foot since January of this year History of Present Illness (HPI) 12/14/15; this is a 60 year old type II diabetic on insulin with diabetic polyneuropathy. He apparently had a callus at roughly his fourth metatarsal head in January he was attempting the. This down and it up with a ulcer. His wife who was present said they have been using saline to clean this wound and using Polysporin and Betadine- but not making much progress. His previous surgery by his podiatrist Dr. Milinda [name] in 2014 but does not wish to go back to see Dr. Milinda [name] and was referred here instead. He has not been on any antibiotics and has not had recent x-rays of the foot. He has no known polyneuropathy  and no prior wound history looking [facility] it appears that the patient had foot corrective surgery on 04/02/13 this consisted of an [name] bunion repair on the right second metatarsal, osteotomy of the right fifth metatarsal, osteotomy of the right hammertoe repair on #2 #3 in #5. Sometime after this he injured the foot he was noted to have fractures with fracture of the first metatarsal osteotomy fracture of the second metatarsal osteotomy dislocation of the third metatarsal phalangeal joint and fracture of the fifth metatarsal osteotomy site here and I believe he went back to the operating room and had internal fixation of the right foot. He has not had any recent x-rays of the right foot. He had an MRI of the right foot in 2014 last hemoglobin A1c I see is at 7.1 on 10/29/15. He had a normal serum albumin on 04/29/15 of 4.8. He does not have any nutritional issues 12/21/15; x-ray of the foot that showed no acute bony abnormality no radiopaque foreign bodies. Postsurgical changes in the right first metatarsal were stable healing of fractures in the distal second metatarsal, old healed right fifth metatarsal fracture cortical erosions noted in the right third and fourth metatarsals. We have been using Prisma Objective Constitutional Vitals Time Taken: 2:22 PM, Height: 72 in, Weight: 223 lbs, BMI: 30.2, Temperature: 98.2 F, Pulse: 89 [name], [name] ([id number]) bpm, Respiratory Rate: 17 breaths/min, Blood Pressure: 145/78 mmHg. Integumentary (Hair, Skin) Wound #1 status is Open. Original cause of wound was Gradually Appeared. The wound is located on the Plantar Foot. The wound measures 1.3cm length x 1.3cm width x 0.3cm depth; 1.327cm^2 area and 0.398cm^3 volume. The wound is limited to skin breakdown. There is no tunneling or undermining noted. There is a medium amount of serosanguineous drainage noted. The wound margin is distinct with the outline attached to the wound  base. There is large (67-100%) red granulation within the wound bed. There is a small (1-33%) amount of necrotic tissue within the wound bed including Adherent Slough. The periwound skin appearance exhibited: Callus, Maceration, Moist. The periwound skin appearance did not exhibit: Crepitus, Excoriation, Fluctuance, Friable, Induration, Localized Edema, Rash, Scarring, Dry/Scaly, Atrophie Blanche, Cyanosis, Ecchymosis, Hemosiderin Staining, Mottled, Pallor, Rubor, Erythema. Periwound temperature was noted as No Abnormality. The periwound has tenderness on palpation. Wound #2 status is Healed - Epithelialized. Original cause of wound was Gradually Appeared. The wound is located on  the Right,Plantar Toe Fourth. The wound measures 0cm length x 0cm width x 0cm depth; 0cm^2 area and 0cm^3 volume. The wound is limited to skin breakdown. There is no tunneling or undermining noted. There is a none present amount of drainage noted. The wound margin is distinct with the outline attached to the wound base. There is no granulation within the wound bed. There is no necrotic tissue within the wound bed. The periwound skin appearance did not exhibit: Callus, Crepitus, Excoriation, Fluctuance, Friable, Induration, Localized Edema, Rash, Scarring, Dry/Scaly, Maceration, Moist, Atrophie Blanche, Cyanosis, Ecchymosis, Hemosiderin Staining, Mottled, Pallor, Rubor, Erythema. Periwound temperature was noted as No Abnormality. Assessment Active Problems ICD-10 E11.621 - Type 2 diabetes mellitus with foot ulcer E11.42 - Type 2 diabetes mellitus with diabetic polyneuropathy Procedures Wound #1 Wound #1 is a Diabetic Wound/Ulcer of the Lower Extremity located on the Plantar Foot . There was a Skin/Subcutaneous Tissue Debridement [id number]) debridement with total area of 1.69 sq cm performed by Bradley Dillon, MD. with the following instrument(s): Curette including Fibrin/Slough, Callus, and Subcutaneous after  achieving pain control using Lidocaine 4% Topical Solution. A time out was conducted prior to the start of the procedure. A Minimum amount of bleeding was controlled with Pressure. The procedure was tolerated well with a pain level of 0 throughout and a pain level of 0 following the [name], [name] A. ([id number]) procedure. Post Debridement Measurements: 1.3cm length x 1.3cm width x 0.3cm depth; 0.398cm^3 volume. Post procedure Diagnosis Wound #1: Same as Pre-Procedure Plan Wound Cleansing: Wound #1 Plantar Foot: Cleanse wound with mild soap and water May Shower, gently pat wound dry prior to applying new dressing. May shower with protection. Primary Wound Dressing: Wound #1 Plantar Foot: Prisma Ag Secondary Dressing: Wound #1 Plantar Foot: Gauze and Kerlix/Conform Dressing Change Frequency: Wound #1 Plantar Foot: Change dressing every other day. Follow-up Appointments: Wound #1 Plantar Foot: Return Appointment in 1 week. Off-Loading: Wound #1 Plantar Foot: Open toe surgical shoe to: - Engineer, mining Orders / Instructions: Wound #1 Plantar Foot: Increase protein intake. Activity as tolerated #1 I'm going to continue Prisma for now #2 Darco forefoot offloading boot. I do not think that this is likely did provide sufficient offloading for this patient. It is likely he'll need a total contact cast. I did discuss this with him last week. He would like to put this off for as long as possible which is understandable, I made an agreement with him that we would give this a further 2 weeks however if this is not progress towards healing I think a total contact cast is in order. I think this is largely a pressure issue in terms of nonhealing Rybka, EFFIE KOCHANSKI ([id number]) Electronic Signature(s) Signed: 12/22/2015 5:25:36 PM By: Bradley Ham MD Entered By: [name] on 12/21/2015 14:57:19 Morrical, [name]  ([id number]) -------------------------------------------------------------------------------- SuperBill Details Patient Name: Eshleman, Hallis A. Date of Service: 12/21/2015 Medical Record Patient Account Number: [id number] Number: Treating RN: Bradley Gouty, RN, BSN, Bradley 06/14/1956 [phone] y.o. Other Clinician: Date of Birth/Sex: Male) Treating ROBSON, [name] Primary Care Physician: [name] Physician/Extender: G Referring Physician: Marily [name] in Treatment: 1 Diagnosis Coding ICD-10 Codes Code Description E11.621 Type 2 diabetes mellitus with foot ulcer E11.42 Type 2 diabetes mellitus with diabetic polyneuropathy Facility Procedures CPT4 Code: [id number] Description: [id number] - DEB SUBQ TISSUE 20 SQ CM/< ICD-10 Description Diagnosis E11.621 Type 2 diabetes mellitus with foot ulcer Modifier: Quantity: 1 Physician Procedures CPT4 Code: [id number] Description: [id number] - WC PHYS  SUBQ TISS 20 SQ CM ICD-10 Description Diagnosis E11.621 Type 2 diabetes mellitus with foot ulcer Modifier: Quantity: 1 Electronic Signature(s) Signed: 12/22/2015 5:25:36 PM By: Bradley Ham MD Entered By: [name] on 12/21/2015 14:57:43

## 2015-12-28 ENCOUNTER — Encounter: Payer: 59 | Attending: Internal Medicine | Admitting: Internal Medicine

## 2015-12-28 DIAGNOSIS — E1142 Type 2 diabetes mellitus with diabetic polyneuropathy: Secondary | ICD-10-CM | POA: Insufficient documentation

## 2015-12-28 DIAGNOSIS — L97511 Non-pressure chronic ulcer of other part of right foot limited to breakdown of skin: Secondary | ICD-10-CM | POA: Diagnosis not present

## 2015-12-28 DIAGNOSIS — E11621 Type 2 diabetes mellitus with foot ulcer: Secondary | ICD-10-CM | POA: Insufficient documentation

## 2015-12-28 DIAGNOSIS — L97411 Non-pressure chronic ulcer of right heel and midfoot limited to breakdown of skin: Secondary | ICD-10-CM | POA: Insufficient documentation

## 2015-12-28 DIAGNOSIS — M109 Gout, unspecified: Secondary | ICD-10-CM | POA: Insufficient documentation

## 2015-12-28 DIAGNOSIS — Z794 Long term (current) use of insulin: Secondary | ICD-10-CM | POA: Insufficient documentation

## 2015-12-28 DIAGNOSIS — I10 Essential (primary) hypertension: Secondary | ICD-10-CM | POA: Insufficient documentation

## 2015-12-28 NOTE — Progress Notes (Addendum)
[name] ([id number]) Visit Report for 12/28/2015 Arrival Information Details Patient Name: [name], [name] Date of Service: 12/28/2015 1:30 PM Medical Record Number: [id number] Patient Account Number: [id number] Date of Birth/Sex: 10-17-1955 (60 y.o. Male) Treating RN: Afful, RN, BSN, Velva Harman Primary Care Physician: Viviana Simpler Other Clinician: Referring Physician: Viviana Simpler Treating Physician/Extender: Tito Dine in Treatment: 2 Visit Information History Since Last Visit Added or deleted any medications: No Patient Arrived: Ambulatory Any new allergies or adverse reactions: No Arrival Time: 13:36 Had a fall or experienced change in No Accompanied By: self activities of daily living that may affect Transfer Assistance: None risk of falls: Patient Identification Verified: Yes Signs or symptoms of abuse/neglect since last No Secondary Verification Process Yes visito Completed: Hospitalized since last visit: No Patient Requires Transmission-Based No Has Dressing in Place as Prescribed: Yes Precautions: Pain Present Now: No Patient Has Alerts: Yes Patient Alerts: HgbA1c 7.0 Electronic Signature(s) Signed: 12/28/2015 1:36:44 PM By: Regan Lemming BSN, RN Entered By: Regan Lemming on 12/28/2015 13:36:44 Partridge, Gerda Diss ([id number]) -------------------------------------------------------------------------------- Encounter Discharge Information Details Patient Name: [name], [name] Date of Service: 12/28/2015 1:30 PM Medical Record Number: [id number] Patient Account Number: [id number] Date of Birth/Sex: 03/20/1956 (60 y.o. Male) Treating RN: Afful, RN, BSN, Velva Harman Primary Care Physician: Viviana Simpler Other Clinician: Referring Physician: Viviana Simpler Treating Physician/Extender: Tito Dine in Treatment: 2 Encounter Discharge Information Items Discharge Pain Level: 0 Discharge Condition: Stable Ambulatory Status: Ambulatory Discharge  Destination: Home Transportation: Private Auto Accompanied By: wife Schedule Follow-up Appointment: No Medication Reconciliation completed and provided to Patient/Care No Diannah Rindfleisch: Provided on Clinical Summary of Care: 12/28/2015 Form Type Recipient Paper Patient JW Electronic Signature(s) Signed: 12/28/2015 2:08:29 PM By: Ruthine Dose Entered By: Ruthine Dose on 12/28/2015 14:08:28 Urieta, Gerda Diss ([id number]) -------------------------------------------------------------------------------- Lower Extremity Assessment Details Patient Name: [name], [name] Date of Service: 12/28/2015 1:30 PM Medical Record Number: [id number] Patient Account Number: [id number] Date of Birth/Sex: Nov 20, 1955 (60 y.o. Male) Treating RN: Afful, RN, BSN, Velva Harman Primary Care Physician: Viviana Simpler Other Clinician: Referring Physician: Viviana Simpler Treating Physician/Extender: Tito Dine in Treatment: 2 Vascular Assessment Pulses: Posterior Tibial Dorsalis Pedis Palpable: [Right:Yes] Extremity colors, hair growth, and conditions: Extremity Color: [Right:Normal] Hair Growth on Extremity: [Right:No] Temperature of Extremity: [Right:Warm] Capillary Refill: [Right:< 3 seconds] Toe Nail Assessment Left: Right: Thick: No Discolored: No Deformed: No Improper Length and Hygiene: No Electronic Signature(s) Signed: 12/28/2015 1:37:17 PM By: Regan Lemming BSN, RN Entered By: Regan Lemming on 12/28/2015 13:37:17 Hamblin, Gerda Diss ([id number]) -------------------------------------------------------------------------------- Multi Wound Chart Details Patient Name: [name], [name] Date of Service: 12/28/2015 1:30 PM Medical Record Number: [id number] Patient Account Number: [id number] Date of Birth/Sex: February 26, 1956 (60 y.o. Male) Treating RN: Afful, RN, BSN, Velva Harman Primary Care Physician: Viviana Simpler Other Clinician: Referring Physician: Viviana Simpler Treating Physician/Extender: Tito Dine in Treatment: 2 Vital Signs Height(in): 72 Pulse(bpm): 91 Weight(lbs): 223 Blood Pressure 135/75 (mmHg): Body Mass Index(BMI): 30 Temperature(F): 98.6 Respiratory Rate 17 (breaths/min): Photos: [1:No Photos] [N/A:N/A] Wound Location: [1:Foot - Plantar] [N/A:N/A] Wounding Event: [1:Gradually Appeared] [N/A:N/A] Primary Etiology: [1:Diabetic Wound/Ulcer of the Lower Extremity] [N/A:N/A] Comorbid History: [1:Hypertension, Type II Diabetes, Gout] [N/A:N/A] Date Acquired: [1:08/03/2015] [N/A:N/A] Weeks of Treatment: [1:2] [N/A:N/A] Wound Status: [1:Open] [N/A:N/A] Measurements L x W x D 1.2x1.2x0.3 [N/A:N/A] (cm) Area (cm) : [1:1.131] [N/A:N/A] Volume (cm) : [1:0.339] [N/A:N/A] % Reduction in Area: [1:31.40%] [N/A:N/A] % Reduction in Volume: 48.60% [N/A:N/A] Classification: [1:Grade 1] [N/A:N/A] Exudate Amount: [1:Medium] [N/A:N/A] Exudate  Type: [1:Serosanguineous] [N/A:N/A] Exudate Color: [1:red, brown] [N/A:N/A] Wound Margin: [1:Distinct, outline attached] [N/A:N/A] Granulation Amount: [1:Large (67-100%)] [N/A:N/A] Granulation Quality: [1:Red] [N/A:N/A] Necrotic Amount: [1:Small (1-33%)] [N/A:N/A] Exposed Structures: [1:Fascia: No Fat: No Tendon: No Muscle: No Joint: No Bone: No] [N/A:N/A] Limited to Skin Breakdown Epithelialization: None N/A N/A Periwound Skin Texture: Callus: Yes N/A N/A Edema: No Excoriation: No Induration: No Crepitus: No Fluctuance: No Friable: No Rash: No Scarring: No Periwound Skin Maceration: Yes N/A N/A Moisture: Moist: Yes Dry/Scaly: No Periwound Skin Color: Atrophie Blanche: No N/A N/A Cyanosis: No Ecchymosis: No Erythema: No Hemosiderin Staining: No Mottled: No Pallor: No Rubor: No Temperature: No Abnormality N/A N/A Tenderness on Yes N/A N/A Palpation: Wound Preparation: Ulcer Cleansing: N/A N/A Rinsed/Irrigated with Saline Topical Anesthetic Applied: Other: lidocaine 4% Treatment Notes Electronic  Signature(s) Signed: 12/28/2015 2:19:20 PM By: Regan Lemming BSN, RN Entered By: Regan Lemming on 12/28/2015 13:44:33 Roberson, Gerda Diss ([id number]) -------------------------------------------------------------------------------- [place] Details Patient Name: Jake, Monterio A. Date of Service: 12/28/2015 1:30 PM Medical Record Number: [id number] Patient Account Number: [id number] Date of Birth/Sex: February 06, 1956 (60 y.o. Male) Treating RN: Afful, RN, BSN, Velva Harman Primary Care Physician: Viviana Simpler Other Clinician: Referring Physician: Viviana Simpler Treating Physician/Extender: Tito Dine in Treatment: 2 Active Inactive Abuse / Safety / Falls / Self Care Management Nursing Diagnoses: Impaired home maintenance Impaired physical mobility Knowledge deficit related to abuse or neglect Knowledge deficit related to: safety; personal, health (wound), emergency Potential for falls Goals: Patient will remain injury free Date Initiated: 12/14/2015 Goal Status: Active Patient/caregiver will verbalize understanding of skin care regimen Date Initiated: 12/14/2015 Goal Status: Active Patient/caregiver will verbalize/demonstrate measure taken to improve self care Date Initiated: 12/14/2015 Goal Status: Active Patient/caregiver will verbalize/demonstrate measures taken to improve the patient's personal safety Date Initiated: 12/14/2015 Goal Status: Active Patient/caregiver will verbalize/demonstrate measures taken to prevent injury and/or falls Date Initiated: 12/14/2015 Goal Status: Active Patient/caregiver will verbalize/demonstrate understanding of what to do in case of emergency Date Initiated: 12/14/2015 Goal Status: Active Interventions: Assess fall risk on admission and as needed Assess: immobility, friction, shearing, incontinence upon admission and as needed Assess impairment of mobility on admission and as needed per policy Assess self care needs on admission and  as needed Patient referred to community resources (specify in notes) Thaxton, SUHAYB SLEMMER. ([id number]) Provide education on basic hygiene Provide education on personal and home safety Provide education on safe transfers Provide education on vaccinations Treatment Activities: Education provided on Basic Hygiene : 12/21/2015 Notes: Orientation to the Wound Care Program Nursing Diagnoses: Knowledge deficit related to the wound healing center program Goals: Patient/caregiver will verbalize understanding of the [facility] Date Initiated: 12/14/2015 Goal Status: Active Interventions: Provide education on orientation to the wound center Notes: Peripheral Neuropathy Nursing Diagnoses: Knowledge deficit related to disease process and management of peripheral neurovascular dysfunction Potential alteration in peripheral tissue perfusion (select prior to confirmation of diagnosis) Goals: Patient/caregiver will verbalize understanding of disease process and disease management Date Initiated: 12/14/2015 Goal Status: Active Interventions: Assess signs and symptoms of neuropathy upon admission and as needed Provide education on Management of Neuropathy and Related Ulcers Provide education on Management of Neuropathy upon discharge from the [place] Treatment Activities: Patient referred for customized footwear/offloading : 12/14/2015 Notes: Wound/Skin Impairment Nery, STANLEE DANH ([id number]) Nursing Diagnoses: Impaired tissue integrity Knowledge deficit related to ulceration/compromised skin integrity Goals: Patient/caregiver will verbalize understanding of skin care regimen Date Initiated: 12/14/2015 Goal Status: Active Ulcer/skin breakdown will have a volume  reduction of 30% by week 4 Date Initiated: 12/14/2015 Goal Status: Active Ulcer/skin breakdown will have a volume reduction of 50% by week 8 Date Initiated: 12/14/2015 Goal Status: Active Ulcer/skin breakdown will have  a volume reduction of 80% by week 12 Date Initiated: 12/14/2015 Goal Status: Active Ulcer/skin breakdown will heal within 14 weeks Date Initiated: 12/14/2015 Goal Status: Active Interventions: Assess patient/caregiver ability to obtain necessary supplies Assess patient/caregiver ability to perform ulcer/skin care regimen upon admission and as needed Assess ulceration(s) every visit Provide education on ulcer and skin care Treatment Activities: Referred to DME Chyenne Sobczak for dressing supplies : 12/14/2015 Skin care regimen initiated : 12/14/2015 Topical wound management initiated : 12/14/2015 Notes: Electronic Signature(s) Signed: 12/28/2015 2:19:20 PM By: Regan Lemming BSN, RN Entered By: Regan Lemming on 12/28/2015 13:44:27 Brummond, Gerda Diss ([id number]) -------------------------------------------------------------------------------- Pain Assessment Details Patient Name: Haugan, Jaymie A. Date of Service: 12/28/2015 1:30 PM Medical Record Number: [id number] Patient Account Number: [id number] Date of Birth/Sex: Mar 06, 1956 (60 y.o. Male) Treating RN: Afful, RN, BSN, Velva Harman Primary Care Physician: Viviana Simpler Other Clinician: Referring Physician: Viviana Simpler Treating Physician/Extender: Tito Dine in Treatment: 2 Active Problems Location of Pain Severity and Description of Pain Patient Has Paino No Site Locations With Dressing Change: No Pain Management and Medication Current Pain Management: Electronic Signature(s) Signed: 12/28/2015 1:36:53 PM By: Regan Lemming BSN, RN Entered By: Regan Lemming on 12/28/2015 13:36:53 Filley, Gerda Diss ([id number]) -------------------------------------------------------------------------------- Patient/Caregiver Education Details Patient Name: Escareno, Bryor A. Date of Service: 12/28/2015 1:30 PM Medical Record Number: [id number] Patient Account Number: [id number] Date of Birth/Gender: [birth year] (60 y.o. Male) Treating RN: Afful, RN, BSN, Velva Harman Primary  Care Physician: Viviana Simpler Other Clinician: Referring Physician: Viviana Simpler Treating Physician/Extender: Tito Dine in Treatment: 2 Education Assessment Education Provided To: Patient Education Topics Provided Basic Hygiene: Methods: Explain/Verbal Responses: State content correctly Peripheral Neuropathy: Methods: Explain/Verbal Responses: State content correctly Safety: Methods: Explain/Verbal Responses: State content correctly Tissue Oxygenation: Methods: Explain/Verbal Responses: State content correctly Welcome To [facility]: Methods: Explain/Verbal Responses: State content correctly Wound/Skin Impairment: Methods: Explain/Verbal Responses: State content correctly Electronic Signature(s) Signed: 12/28/2015 2:19:20 PM By: Regan Lemming BSN, RN Entered By: Regan Lemming on 12/28/2015 13:58:09 Alfieri, Gerda Diss ([id number]) -------------------------------------------------------------------------------- Wound Assessment Details Patient Name: Magadan, Macky A. Date of Service: 12/28/2015 1:30 PM Medical Record Number: [id number] Patient Account Number: [id number] Date of Birth/Sex: Jan 21, 1956 (60 y.o. Male) Treating RN: Afful, RN, BSN, Velva Harman Primary Care Physician: Viviana Simpler Other Clinician: Referring Physician: Viviana Simpler Treating Physician/Extender: Tito Dine in Treatment: 2 Wound Status Wound Number: 1 Primary Diabetic Wound/Ulcer of the Lower Etiology: Extremity Wound Location: Foot - Plantar Wound Status: Open Wounding Event: Gradually Appeared Comorbid Hypertension, Type II Diabetes, Date Acquired: 08/03/2015 History: Gout Weeks Of Treatment: 2 Clustered Wound: No Photos Photo Uploaded By: Regan Lemming on 12/28/2015 14:18:04 Wound Measurements Length: (cm) 1.2 Width: (cm) 1.2 Depth: (cm) 0.3 Area: (cm) 1.131 Volume: (cm) 0.339 % Reduction in Area: 31.4% % Reduction in Volume: 48.6% Epithelialization:  None Tunneling: No Undermining: No Wound Description Classification: Grade 1 Wound Margin: Distinct, outline attached Exudate Amount: Medium Exudate Type: Serosanguineous Exudate Color: red, brown Foul Odor After Cleansing: No Wound Bed Granulation Amount: Large (67-100%) Exposed Structure Granulation Quality: Red Fascia Exposed: No Necrotic Amount: Small (1-33%) Fat Layer Exposed: No Necrotic Quality: Adherent Slough Tendon Exposed: No Messmer, Trevyon A. ([id number]) Muscle Exposed: No Joint Exposed: No Bone Exposed: No Limited to Skin Breakdown Periwound Skin Texture Texture  Color No Abnormalities Noted: No No Abnormalities Noted: No Callus: Yes Atrophie Blanche: No Crepitus: No Cyanosis: No Excoriation: No Ecchymosis: No Fluctuance: No Erythema: No Friable: No Hemosiderin Staining: No Induration: No Mottled: No Localized Edema: No Pallor: No Rash: No Rubor: No Scarring: No Temperature / Pain Moisture Temperature: No Abnormality No Abnormalities Noted: No Tenderness on Palpation: Yes Dry / Scaly: No Maceration: Yes Moist: Yes Wound Preparation Ulcer Cleansing: Rinsed/Irrigated with Saline Topical Anesthetic Applied: Other: lidocaine 4%, Treatment Notes Wound #1 (Plantar Foot) 1. Cleansed with: Clean wound with Normal Saline 4. Dressing Applied: Hydrafera Blue 5. Secondary Dressing Applied Gauze and Kerlix/Conform 7. Secured with Recruitment consultant) Signed: 12/28/2015 2:19:20 PM By: Regan Lemming BSN, RN Entered By: Regan Lemming on 12/28/2015 13:44:20 Romanek, Gerda Diss ([id number]) -------------------------------------------------------------------------------- Vitals Details Patient Name: Madrigal, Lesean A. Date of Service: 12/28/2015 1:30 PM Medical Record Number: [id number] Patient Account Number: [id number] Date of Birth/Sex: 18-Mar-1956 (60 y.o. Male) Treating RN: Afful, RN, BSN, Velva Harman Primary Care Physician: Viviana Simpler Other  Clinician: Referring Physician: Viviana Simpler Treating Physician/Extender: Tito Dine in Treatment: 2 Vital Signs Time Taken: 13:40 Temperature (F): 98.6 Height (in): 72 Pulse (bpm): 91 Weight (lbs): 223 Respiratory Rate (breaths/min): 17 Body Mass Index (BMI): 30.2 Blood Pressure (mmHg): 135/75 Reference Range: 80 - 120 mg / dl Electronic Signature(s) Signed: 12/28/2015 2:19:20 PM By: Regan Lemming BSN, RN Entered By: Regan Lemming on 12/28/2015 13:40:49

## 2015-12-30 NOTE — Progress Notes (Signed)
Tilley, SHANDY PORRES ([id number]) Visit Report for 12/28/2015 Chief Complaint Document Details Patient Name: Newbrough, Theseus A. Date of Service: 12/28/2015 1:30 PM Medical Record Patient Account Number: [id number] Number: Treating RN: Baruch Gouty, RN, BSN, Rita 04/19/1956 [phone] y.o. Other Clinician: Date of Birth/Sex: Male) Treating ROBSON, MICHAEL Primary Care Physician: Viviana Simpler Physician/Extender: G Referring Physician: Marily Memos in Treatment: 2 Information Obtained from: Patient Chief Complaint 12/14/15; this is a 60 year old man who is a type II diabetic with diabetic neuropathy on insulin and metformin. Presents with a nonhealing ulcer on his right foot since January of this year Electronic Signature(s) Signed: 12/29/2015 7:56:34 AM By: Linton Ham MD Entered By: Linton Ham on 12/28/2015 15:04:24 Eberwein, Gerda Diss ([id number]) -------------------------------------------------------------------------------- Debridement Details Patient Name: Kobrin, Jahmire A. Date of Service: 12/28/2015 1:30 PM Medical Record Patient Account Number: [id number] Number: Treating RN: Baruch Gouty, RN, BSN, Rita [birth year] [phone] y.o. Other Clinician: Date of Birth/Sex: Male) Treating ROBSON, MICHAEL Primary Care Physician: Viviana Simpler Physician/Extender: G Referring Physician: Marily Memos in Treatment: 2 Debridement Performed for Wound #1 Plantar Foot Assessment: Performed By: Physician Ricard Dillon, MD Debridement: Debridement Pre-procedure Yes Verification/Time Out Taken: Start Time: 13:50 Pain Control: Lidocaine 4% Topical Solution Level: Skin/Subcutaneous Tissue Total Area Debrided (L x 1.2 (cm) x 1.2 (cm) = 1.44 (cm) W): Tissue and other Non-Viable, Callus, Fibrin/Slough, Subcutaneous material debrided: Instrument: Curette Bleeding: Minimum Hemostasis Achieved: Pressure End Time: 13:54 Procedural Pain: 0 Post Procedural Pain: 0 Response to  Treatment: Procedure was tolerated well Post Debridement Measurements of Total Wound Length: (cm) 1.2 Width: (cm) 1.2 Depth: (cm) 0.3 Volume: (cm) 0.339 Post Procedure Diagnosis Same as Pre-procedure Electronic Signature(s) Signed: 12/29/2015 7:56:34 AM By: Linton Ham MD Signed: 12/29/2015 4:14:26 PM By: Regan Lemming BSN, RN Previous Signature: 12/28/2015 2:19:20 PM Version By: Regan Lemming BSN, RN Entered By: Linton Ham on 12/28/2015 15:04:09 Bellmore, Gerda Diss ([id number]) Alma, Gerda Diss ([id number]) -------------------------------------------------------------------------------- HPI Details Patient Name: Vasko, Yisrael A. Date of Service: 12/28/2015 1:30 PM Medical Record Patient Account Number: [id number] Number: Treating RN: Baruch Gouty, RN, BSN, Rita 1955-10-18 [phone] y.o. Other Clinician: Date of Birth/Sex: Male) Treating ROBSON, MICHAEL Primary Care Physician: Viviana Simpler Physician/Extender: G Referring Physician: Marily Memos in Treatment: 2 History of Present Illness HPI Description: 12/14/15; this is a 60 year old type II diabetic on insulin with diabetic polyneuropathy. He apparently had a callus at roughly his fourth metatarsal head in January he was attempting to remove however he ended up with a ulcer. His wife who was present said they have been using saline to clean this wound and using Polysporin and Betadine- but not making much progress. His previous surgery by his podiatrist Dr. Milinda Pointer in 2014 but does not wish to go back to see Dr. Milinda Pointer and was referred here instead. He has not been on any antibiotics and has not had recent x-rays of the foot. He has no known polyneuropathy and no prior wound history [facility] appears that the patient had foot corrective surgery on 04/02/13 this consisted of an [facility] bunion repair on the right second metatarsal, osteotomy of the right fifth metatarsal, osteotomy of the right hammertoe  repair on #2 #3 in #5. Sometime after this he injured the foot he was noted to have fractures with fracture of the first metatarsal osteotomy fracture of the second metatarsal osteotomy dislocation of the third metatarsal phalangeal joint and fracture of the fifth metatarsal osteotomy site here and I believe he went back  to the operating room and had internal fixation of the right foot. He has not had any recent x-rays of the right foot. He had an MRI of the right foot in 2014 last hemoglobin A1c I see is at 7.1 on 10/29/15. He had a normal serum albumin on 04/29/15 of 4.8. He does not have any nutritional issues 12/21/15; x-ray of the foot that showed no acute bony abnormality no radiopaque foreign bodies. Postsurgical changes in the right first metatarsal were stable healing of fractures in the distal second metatarsal, old healed right fifth metatarsal fracture cortical erosions noted in the right third and fourth metatarsals. We have been using Prisma 12/28/15 the area on his right foot at roughly the fourth metatarsal head is unchanged. Still some circumferential callus and nonviable subcutaneous tissue which requires debridement. No obvious infection Electronic Signature(s) Signed: 12/29/2015 7:56:34 AM By: Linton Ham MD Entered By: Linton Ham on 12/28/2015 15:09:09 Gowans, Gerda Diss ([id number]) -------------------------------------------------------------------------------- Physical Exam Details Patient Name: Mahadeo, Kendre A. Date of Service: 12/28/2015 1:30 PM Medical Record Patient Account Number: [id number] Number: Treating RN: Baruch Gouty, RN, BSN, Rita 1956/04/20 [phone] y.o. Other Clinician: Date of Birth/Sex: Male) Treating ROBSON, MICHAEL Primary Care Physician: Viviana Simpler Physician/Extender: G Referring Physician: Marily Memos in Treatment: 2 Constitutional Sitting or standing Blood Pressure is within target range for patient.. Patient is hypertensive.. Pulse  regular and within target range for patient.Marland Kitchen Respirations regular, non-labored and within target range.. Temperature is normal and within the target range for the patient.. Cardiovascular Pedal pulses palpable and strong bilaterally.. Notes Wound exam; plantar aspect of the fourth metatarsal head. The wound underwent an aggressive debridement today to remove circumferential callus thick and subcutaneous tissue and slough over the wound bed. I don't see much improvement in this, he will likely require a total contact cast next week Electronic Signature(s) Signed: 12/29/2015 7:56:34 AM By: Linton Ham MD Entered By: Linton Ham on 12/28/2015 15:10:25 Ferrell, Gerda Diss ([id number]) -------------------------------------------------------------------------------- Physician Orders Details Patient Name: Arterburn, Italo A. Date of Service: 12/28/2015 1:30 PM Medical Record Patient Account Number: [id number] Number: Treating RN: Baruch Gouty, RN, BSN, Rita [birth year] [phone] y.o. Other Clinician: Date of Birth/Sex: Male) Treating ROBSON, MICHAEL Primary Care Physician: Viviana Simpler Physician/Extender: G Referring Physician: Marily Memos in Treatment: 2 Verbal / Phone Orders: Yes Clinician: Afful, RN, BSN, Rita Read Back and Verified: Yes Diagnosis Coding Wound Cleansing Wound #1 Plantar Foot o Cleanse wound with mild soap and water o May Shower, gently pat wound dry prior to applying new dressing. o May shower with protection. Primary Wound Dressing Wound #1 Plantar Foot o Hydrafera Blue Secondary Dressing Wound #1 Plantar Foot o Gauze and Kerlix/Conform Dressing Change Frequency Wound #1 Plantar Foot o Change dressing every other day. Follow-up Appointments Wound #1 Plantar Foot o Return Appointment in 1 week. 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 o Activity as tolerated Electronic Signature(s) Ailey, STEPFON MEYEROWITZ ([id number]) Signed: 12/28/2015 2:19:20 PM By: Regan Lemming BSN, RN Signed: 12/29/2015 7:56:34 AM By: Linton Ham MD Entered By: Regan Lemming on 12/28/2015 13:56:54 Shatswell, Gerda Diss ([id number]) -------------------------------------------------------------------------------- Problem List Details Patient Name: Whitmoyer, Raedyn A. Date of Service: 12/28/2015 1:30 PM Medical Record Patient Account Number: [id number] Number: Treating RN: Baruch Gouty, RN, BSN, Rita 07-02-1956 [phone] y.o. Other Clinician: Date of Birth/Sex: Male) Treating ROBSON, MICHAEL Primary Care Physician: Viviana Simpler Physician/Extender: Darnell Level  Referring Physician: Marily Memos in Treatment: 2 Active Problems ICD-10 Encounter Code Description Active Date Diagnosis E11.621 Type 2 diabetes mellitus with foot ulcer 12/14/2015 Yes E11.42 Type 2 diabetes mellitus with diabetic polyneuropathy 12/14/2015 Yes Inactive Problems Resolved Problems Electronic Signature(s) Signed: 12/29/2015 7:56:34 AM By: Linton Ham MD Entered By: Linton Ham on 12/28/2015 15:03:21 Perkovich, Gerda Diss ([id number]) -------------------------------------------------------------------------------- Progress Note Details Patient Name: Carrera, Lamond A. Date of Service: 12/28/2015 1:30 PM Medical Record Patient Account Number: [id number] Number: Treating RN: Baruch Gouty, RN, BSN, Rita Nov 25, 1955 [phone] y.o. Other Clinician: Date of Birth/Sex: Male) Treating ROBSON, MICHAEL Primary Care Physician: Viviana Simpler Physician/Extender: G Referring Physician: Marily Memos in Treatment: 2 Subjective Chief Complaint Information obtained from Patient 12/14/15; this is a 60 year old man who is a type II diabetic with diabetic neuropathy on insulin and metformin. Presents with a nonhealing ulcer on his right foot since January of this  year History of Present Illness (HPI) 12/14/15; this is a 60 year old type II diabetic on insulin with diabetic polyneuropathy. He apparently had a callus at roughly his fourth metatarsal head in January he was attempting to remove however he ended up with a ulcer. His wife who was present said they have been using saline to clean this wound and using Polysporin and Betadine- but not making much progress. His previous surgery by his podiatrist Dr. Milinda Pointer in 2014 but does not wish to go back to see Dr. Milinda Pointer and was referred here instead. He has not been on any antibiotics and has not had recent x-rays of the foot. He has no known polyneuropathy and no prior wound history [facility] appears that the patient had foot corrective surgery on 04/02/13 this consisted of an [facility] bunion repair on the right second metatarsal, osteotomy of the right fifth metatarsal, osteotomy of the right hammertoe repair on #2 #3 in #5. Sometime after this he injured the foot he was noted to have fractures with fracture of the first metatarsal osteotomy fracture of the second metatarsal osteotomy dislocation of the third metatarsal phalangeal joint and fracture of the fifth metatarsal osteotomy site here and I believe he went back to the operating room and had internal fixation of the right foot. He has not had any recent x-rays of the right foot. He had an MRI of the right foot in 2014 last hemoglobin A1c I see is at 7.1 on 10/29/15. He had a normal serum albumin on 04/29/15 of 4.8. He does not have any nutritional issues 12/21/15; x-ray of the foot that showed no acute bony abnormality no radiopaque foreign bodies. Postsurgical changes in the right first metatarsal were stable healing of fractures in the distal second metatarsal, old healed right fifth metatarsal fracture cortical erosions noted in the right third and fourth metatarsals. We have been using Prisma 12/28/15 the area on his right  foot at roughly the fourth metatarsal head is unchanged. Still some circumferential callus and nonviable subcutaneous tissue which requires debridement. No obvious infection Objective Moening, Lawerence A. ([id number]) Constitutional Sitting or standing Blood Pressure is within target range for patient.. Patient is hypertensive.. Pulse regular and within target range for patient.Marland Kitchen Respirations regular, non-labored and within target range.. Temperature is normal and within the target range for the patient.. Vitals Time Taken: 1:40 PM, Height: 72 in, Weight: 223 lbs, BMI: 30.2, Temperature: 98.6 F, Pulse: 91 bpm, Respiratory Rate: 17 breaths/min, Blood Pressure: 135/75 mmHg. Cardiovascular Pedal pulses palpable and strong bilaterally.. General Notes: Wound exam; plantar  aspect of the fourth metatarsal head. The wound underwent an aggressive debridement today to remove circumferential callus thick and subcutaneous tissue and slough over the wound bed. I don't see much improvement in this, he will likely require a total contact cast next week Integumentary (Hair, Skin) Wound #1 status is Open. Original cause of wound was Gradually Appeared. The wound is located on the Plantar Foot. The wound measures 1.2cm length x 1.2cm width x 0.3cm depth; 1.131cm^2 area and 0.339cm^3 volume. The wound is limited to skin breakdown. There is no tunneling or undermining noted. There is a medium amount of serosanguineous drainage noted. The wound margin is distinct with the outline attached to the wound base. There is large (67-100%) red granulation within the wound bed. There is a small (1-33%) amount of necrotic tissue within the wound bed including Adherent Slough. The periwound skin appearance exhibited: Callus, Maceration, Moist. The periwound skin appearance did not exhibit: Crepitus, Excoriation, Fluctuance, Friable, Induration, Localized Edema, Rash, Scarring, Dry/Scaly, Atrophie Blanche, Cyanosis, Ecchymosis,  Hemosiderin Staining, Mottled, Pallor, Rubor, Erythema. Periwound temperature was noted as No Abnormality. The periwound has tenderness on palpation. Assessment Active Problems ICD-10 E11.621 - Type 2 diabetes mellitus with foot ulcer E11.42 - Type 2 diabetes mellitus with diabetic polyneuropathy Procedures Wound #1 Romanoff, Maude A. ([id number]) Wound #1 is a Diabetic Wound/Ulcer of the Lower Extremity located on the Plantar Foot . There was a Skin/Subcutaneous Tissue Debridement [id number]) debridement with total area of 1.44 sq cm performed by Ricard Dillon, MD. with the following instrument(s): Curette to remove Non-Viable tissue/material including Fibrin/Slough, Callus, and Subcutaneous after achieving pain control using Lidocaine 4% Topical Solution. A time out was conducted prior to the start of the procedure. A Minimum amount of bleeding was controlled with Pressure. The procedure was tolerated well with a pain level of 0 throughout and a pain level of 0 following the procedure. Post Debridement Measurements: 1.2cm length x 1.2cm width x 0.3cm depth; 0.339cm^3 volume. Post procedure Diagnosis Wound #1: Same as Pre-Procedure Plan Wound Cleansing: Wound #1 Plantar Foot: Cleanse wound with mild soap and water May Shower, gently pat wound dry prior to applying new dressing. May shower with protection. Primary Wound Dressing: Wound #1 Plantar Foot: Hydrafera Blue Secondary Dressing: Wound #1 Plantar Foot: Gauze and Kerlix/Conform Dressing Change Frequency: Wound #1 Plantar Foot: Change dressing every other day. Follow-up Appointments: Wound #1 Plantar Foot: Return Appointment in 1 week. Off-Loading: Wound #1 Plantar Foot: Open toe surgical shoe to: - Engineer, mining Orders / Instructions: Wound #1 Plantar Foot: Increase protein intake. Activity as tolerated I changed the dressing from Prisma to [facility], Kerlix, conform unless there is a major  change here next week for the better he will require a total contact cast Tino, ASH KOSIOR ([id number]) Electronic Signature(s) Signed: 12/29/2015 7:56:34 AM By: Linton Ham MD Entered By: Linton Ham on 12/28/2015 15:11:40 Stailey, Gerda Diss ([id number]) -------------------------------------------------------------------------------- SuperBill Details Patient Name: Boruff, Makel A. Date of Service: 12/28/2015 Medical Record Patient Account Number: [id number] Number: Treating RN: Baruch Gouty, RN, BSN, Rita 1956/07/13 [phone] y.o. Other Clinician: Date of Birth/Sex: Male) Treating ROBSON, MICHAEL Primary Care Physician: Viviana Simpler Physician/Extender: G Referring Physician: Marily Memos in Treatment: 2 Diagnosis Coding ICD-10 Codes Code Description E11.621 Type 2 diabetes mellitus with foot ulcer E11.42 Type 2 diabetes mellitus with diabetic polyneuropathy Facility Procedures CPT4 Code: [id number] Description: [id number] - DEB SUBQ TISSUE 20 SQ CM/< ICD-10 Description Diagnosis E11.621 Type 2 diabetes mellitus with foot ulcer  Modifier: Quantity: 1 Physician Procedures CPT4 Code: [id number] Description: [id number] - WC PHYS SUBQ TISS 20 SQ CM ICD-10 Description Diagnosis E11.621 Type 2 diabetes mellitus with foot ulcer Modifier: Quantity: 1 Electronic Signature(s) Signed: 12/29/2015 7:56:34 AM By: Linton Ham MD Entered By: Linton Ham on 12/28/2015 15:11:57

## 2016-01-02 ENCOUNTER — Other Ambulatory Visit: Payer: Self-pay | Admitting: Internal Medicine

## 2016-01-04 ENCOUNTER — Encounter: Payer: 59 | Admitting: Internal Medicine

## 2016-01-04 DIAGNOSIS — E11621 Type 2 diabetes mellitus with foot ulcer: Secondary | ICD-10-CM | POA: Diagnosis not present

## 2016-01-04 NOTE — Progress Notes (Signed)
Brucks, XZAVIAN GAMBALE ([id number]) Visit Report for 01/04/2016 Chief Complaint Document Details Patient Name: [name], [name] Date of Service: 01/04/2016 1:30 PM Medical Record Patient Account Number: [id number] Number: Treating RN: Baruch Gouty, RN, BSN, Rita 1955/09/03 [phone] y.o. Other Clinician: Date of Birth/Sex: Male) Treating ROBSON, MICHAEL Primary Care Physician: Viviana Simpler Physician/Extender: G Referring Physician: Marily Memos in Treatment: 3 Information Obtained from: Patient Chief Complaint 12/14/15; this is a 60 year old man who is a type II diabetic with diabetic neuropathy on insulin and metformin. Presents with a nonhealing ulcer on his right foot since January of this year Electronic Signature(s) Signed: 01/04/2016 4:35:27 PM By: Linton Ham MD Entered By: Linton Ham on 01/04/2016 14:03:06 [place], [name] ([id number]) -------------------------------------------------------------------------------- Debridement Details Patient Name: [place], Marley A. Date of Service: 01/04/2016 1:30 PM Medical Record Patient Account Number: [id number] Number: Treating RN: Baruch Gouty, RN, BSN, Rita 03-Aug-1955 [phone] y.o. Other Clinician: Date of Birth/Sex: Male) Treating ROBSON, MICHAEL Primary Care Physician: Viviana Simpler Physician/Extender: G Referring Physician: Marily Memos in Treatment: 3 Debridement Performed for Wound #1 Plantar Foot Assessment: Performed By: Physician Ricard Dillon, MD Debridement: Debridement Pre-procedure Yes Verification/Time Out Taken: Start Time: 13:35 Pain Control: Lidocaine 4% Topical Solution Level: Skin/Subcutaneous Tissue Total Area Debrided (L x 1.2 (cm) x 1 (cm) = 1.2 (cm) W): Tissue and other Non-Viable, Callus, Fibrin/Slough, Subcutaneous material debrided: Instrument: Curette Bleeding: Minimum Hemostasis Achieved: Pressure End Time: 13:40 Procedural Pain: 0 Post Procedural Pain: 0 Response to  Treatment: Procedure was tolerated well Post Debridement Measurements of Total Wound Length: (cm) 1.2 Width: (cm) 1 Depth: (cm) 0.2 Volume: (cm) 0.188 Post Procedure Diagnosis Same as Pre-procedure Electronic Signature(s) Signed: 01/04/2016 4:35:27 PM By: Linton Ham MD Signed: 01/04/2016 4:40:39 PM By: Regan Lemming BSN, RN Entered By: Linton Ham on 01/04/2016 14:02:42 [name], [name] ([id number]) -------------------------------------------------------------------------------- HPI Details Patient Name: [name], [name] Date of Service: 01/04/2016 1:30 PM Medical Record Patient Account Number: [id number] Number: Treating RN: Baruch Gouty, RN, BSN, Rita March 25, 1956 [phone] y.o. Other Clinician: Date of Birth/Sex: Male) Treating ROBSON, MICHAEL Primary Care Physician: Viviana Simpler Physician/Extender: G Referring Physician: Marily Memos in Treatment: 3 History of Present Illness HPI Description: 12/14/15; this is a 60 year old type II diabetic on insulin with diabetic polyneuropathy. He apparently had a callus at roughly his fourth metatarsal head in January he was attempting to remove however he ended up with a ulcer. His wife who was present said they have been using saline to clean this wound and using Polysporin and Betadine- but not making much progress. His previous surgery by his podiatrist Dr. Milinda Pointer in 2014 but does not wish to go back to see Dr. Milinda Pointer and was referred here instead. He has not been on any antibiotics and has not had recent x-rays of the foot. He has no known polyneuropathy and no prior wound history [facility] appears that the patient had foot corrective surgery on 04/02/13 this consisted of an [facility] bunion repair on the right second metatarsal, osteotomy of the right fifth metatarsal, osteotomy of the right hammertoe repair on #2 #3 in #5. Sometime after this he injured the foot he was noted to have fractures with  fracture of the first metatarsal osteotomy fracture of the second metatarsal osteotomy dislocation of the third metatarsal phalangeal joint and fracture of the fifth metatarsal osteotomy site here and I believe he went back to the operating room and had internal fixation of the right foot. He has not  had any recent x-rays of the right foot. He had an MRI of the right foot in 2014 last hemoglobin A1c I see is at 7.1 on 10/29/15. He had a normal serum albumin on 04/29/15 of 4.8. He does not have any nutritional issues 12/21/15; x-ray of the foot that showed no acute bony abnormality no radiopaque foreign bodies. Postsurgical changes in the right first metatarsal were stable healing of fractures in the distal second metatarsal, old healed right fifth metatarsal fracture cortical erosions noted in the right third and fourth metatarsals. We have been using Prisma 12/28/15 the area on his right foot at roughly the fourth metatarsal head is unchanged. Still some circumferential callus and nonviable subcutaneous tissue which requires debridement. No obvious infection 01/04/16; wound is unchanged. Notable for less circumferential callus. Still non-viable subcutaneous tissue which requires debridement. No obvious infection. The patient did not come prepared for a total contact cast we will put this on next week Electronic Signature(s) Signed: 01/04/2016 4:35:27 PM By: Linton Ham MD Entered By: Linton Ham on 01/04/2016 14:04:08 [name], [name] ([id number]) -------------------------------------------------------------------------------- Physical Exam Details Patient Name: [name], [name] Date of Service: 01/04/2016 1:30 PM Medical Record Patient Account Number: [id number] Number: Treating RN: Baruch Gouty, RN, BSN, Rita 23-Sep-1955 [phone] y.o. Other Clinician: Date of Birth/Sex: Male) Treating ROBSON, MICHAEL Primary Care Physician: Viviana Simpler Physician/Extender: G Referring Physician: Marily Memos in Treatment: 3 Notes Exam; plantar aspect of the right fourth metatarsal head. Less aggressive debridement required. He has less callus, less nonviable subcutaneous tissue and less slough over the wound nevertheless debridement proceeded. I think he will need to total contact cast placed as soon as possible we discussed this Electronic Signature(s) Signed: 01/04/2016 4:35:27 PM By: Linton Ham MD Entered By: Linton Ham on 01/04/2016 14:05:01 [name], [name] ([id number]) -------------------------------------------------------------------------------- Physician Orders Details Patient Name: [name], [name] Date of Service: 01/04/2016 1:30 PM Medical Record Patient Account Number: [id number] Number: Treating RN: Baruch Gouty, RN, BSN, Rita 01-23-1956 [phone] y.o. Other Clinician: Date of Birth/Sex: Male) Treating ROBSON, MICHAEL Primary Care Physician: Viviana Simpler Physician/Extender: G Referring Physician: Marily Memos in Treatment: 3 Verbal / Phone Orders: Yes Clinician: Afful, RN, BSN, Rita Read Back and Verified: Yes Diagnosis Coding Wound Cleansing Wound #1 Plantar Foot o Cleanse wound with mild soap and water o May Shower, gently pat wound dry prior to applying new dressing. o May shower with protection. Primary Wound Dressing Wound #1 Plantar Foot o Hydrafera Blue Secondary Dressing Wound #1 Plantar Foot o Gauze and Kerlix/Conform Dressing Change Frequency Wound #1 Plantar Foot o Change dressing every other day. Follow-up Appointments Wound #1 Plantar Foot o Return Appointment in 1 week. 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 o Activity as tolerated Electronic Signature(s) YOGI, COLLA ([id number]) Signed: 01/04/2016 4:35:27 PM By: Linton Ham MD Signed: 01/04/2016 4:40:39 PM  By: Regan Lemming BSN, RN Entered By: Regan Lemming on 01/04/2016 13:39:17 [name], [name] ([id number]) -------------------------------------------------------------------------------- Problem List Details Patient Name: [name], [name] Date of Service: 01/04/2016 1:30 PM Medical Record Patient Account Number: [id number] Number: Treating RN: Baruch Gouty, RN, BSN, Rita [birth year] [phone] y.o. Other Clinician: Date of Birth/Sex: Male) Treating ROBSON, MICHAEL Primary Care Physician: Viviana Simpler Physician/Extender: G Referring Physician: Marily Memos in Treatment: 3 Active Problems ICD-10 Encounter Code Description Active Date Diagnosis E11.621 Type 2 diabetes mellitus with foot ulcer 12/14/2015  Yes E11.42 Type 2 diabetes mellitus with diabetic polyneuropathy 12/14/2015 Yes Inactive Problems Resolved Problems Electronic Signature(s) Signed: 01/04/2016 4:35:27 PM By: Linton Ham MD Entered By: Linton Ham on 01/04/2016 14:02:28 [place], [name] ([id number]) -------------------------------------------------------------------------------- Progress Note Details Patient Name: [name], [name] Date of Service: 01/04/2016 1:30 PM Medical Record Patient Account Number: [id number] Number: Treating RN: Baruch Gouty, RN, BSN, Rita Nov 21, 1955 [phone] y.o. Other Clinician: Date of Birth/Sex: Male) Treating ROBSON, MICHAEL Primary Care Physician: Viviana Simpler Physician/Extender: G Referring Physician: Marily Memos in Treatment: 3 Subjective Chief Complaint Information obtained from Patient 12/14/15; this is a 60 year old man who is a type II diabetic with diabetic neuropathy on insulin and metformin. Presents with a nonhealing ulcer on his right foot since January of this year History of Present Illness (HPI) 12/14/15; this is a 59 year old type II diabetic on insulin with diabetic polyneuropathy. He apparently had a callus at roughly his fourth metatarsal head in  January he was attempting to remove however he ended up with a ulcer. His wife who was present said they have been using saline to clean this wound and using Polysporin and Betadine- but not making much progress. His previous surgery by his podiatrist Dr. Milinda Pointer in 2014 but does not wish to go back to see Dr. Milinda Pointer and was referred here instead. He has not been on any antibiotics and has not had recent x-rays of the foot. He has no known polyneuropathy and no prior wound history [facility] it appears that the patient had foot corrective surgery on 04/02/13 this consisted of an [facility] bunion repair on the right second metatarsal, osteotomy of the right fifth metatarsal, osteotomy of the right hammertoe repair on #2 #3 in #5. Sometime after this he injured the foot he was noted to have fractures with fracture of the first metatarsal osteotomy fracture of the second metatarsal osteotomy dislocation of the third metatarsal phalangeal joint and fracture of the fifth metatarsal osteotomy site here and I believe he went back to the operating room and had internal fixation of the right foot. He has not had any recent x-rays of the right foot. He had an MRI of the right foot in 2014 last hemoglobin A1c I see is at 7.1 on 10/29/15. He had a normal serum albumin on 04/29/15 of 4.8. He does not have any nutritional issues 12/21/15; x-ray of the foot that showed no acute bony abnormality no radiopaque foreign bodies. Postsurgical changes in the right first metatarsal were stable healing of fractures in the distal second metatarsal, old healed right fifth metatarsal fracture cortical erosions noted in the right third and fourth metatarsals. We have been using Prisma 12/28/15 the area on his right foot at roughly the fourth metatarsal head is unchanged. Still some circumferential callus and nonviable subcutaneous tissue which requires debridement. No obvious infection 01/04/16; wound is  unchanged. Notable for less circumferential callus. Still non-viable subcutaneous tissue which requires debridement. No obvious infection. The patient did not come prepared for a total contact cast we will put this on next week [name], [name] ([id number]) Objective Constitutional Vitals Time Taken: 1:26 PM, Height: 72 in, Weight: 223 lbs, BMI: 30.2, Temperature: 98.2 F, Pulse: 103 bpm, Respiratory Rate: 16 breaths/min, Blood Pressure: 160/85 mmHg. Integumentary (Hair, Skin) Wound #1 status is Open. Original cause of wound was Gradually Appeared. The wound is located on the Plantar Foot. The wound measures 1.2cm length x 1cm width x 0.2cm depth; 0.942cm^2 area and 0.188cm^3 volume. The wound  is limited to skin breakdown. There is no tunneling or undermining noted. There is a medium amount of serosanguineous drainage noted. The wound margin is distinct with the outline attached to the wound base. There is large (67-100%) red granulation within the wound bed. There is a small (1-33%) amount of necrotic tissue within the wound bed including Adherent Slough. The periwound skin appearance exhibited: Callus, Maceration, Moist. The periwound skin appearance did not exhibit: Crepitus, Excoriation, Fluctuance, Friable, Induration, Localized Edema, Rash, Scarring, Dry/Scaly, Atrophie Blanche, Cyanosis, Ecchymosis, Hemosiderin Staining, Mottled, Pallor, Rubor, Erythema. Periwound temperature was noted as No Abnormality. The periwound has tenderness on palpation. Assessment Active Problems ICD-10 E11.621 - Type 2 diabetes mellitus with foot ulcer E11.42 - Type 2 diabetes mellitus with diabetic polyneuropathy Procedures Wound #1 Wound #1 is a Diabetic Wound/Ulcer of the Lower Extremity located on the Plantar Foot . There was a Skin/Subcutaneous Tissue Debridement [id number]) debridement with total area of 1.2 sq cm performed by Ricard Dillon, MD. with the following instrument(s): Curette to  remove Non-Viable tissue/material including Fibrin/Slough, Callus, and Subcutaneous after achieving pain control using Lidocaine 4% Topical Solution. A time out was conducted prior to the start of the procedure. A Minimum amount of bleeding was controlled with Pressure. The procedure was tolerated well with a pain level of 0 throughout and a pain level of 0 following the procedure. Post Debridement Measurements: 1.2cm length x 1cm width x 0.2cm depth; 0.188cm^3 volume. [name], Bradley FRYMIRE ([id number]) Post procedure Diagnosis Wound #1: Same as Pre-Procedure Plan Wound Cleansing: Wound #1 Plantar Foot: Cleanse wound with mild soap and water May Shower, gently pat wound dry prior to applying new dressing. May shower with protection. Primary Wound Dressing: Wound #1 Plantar Foot: Hydrafera Blue Secondary Dressing: Wound #1 Plantar Foot: Gauze and Kerlix/Conform Dressing Change Frequency: Wound #1 Plantar Foot: Change dressing every other day. Follow-up Appointments: Wound #1 Plantar Foot: Return Appointment in 1 week. Off-Loading: Wound #1 Plantar Foot: Open toe surgical shoe to: - Engineer, mining Orders / Instructions: Wound #1 Plantar Foot: Increase protein intake. Activity as tolerated #1 continue Hydrofera Blue #2 total contact cast next week Electronic Signature(s) Signed: 01/04/2016 4:35:27 PM By: Linton Ham MD Entered By: Linton Ham on 01/04/2016 14:05:43 [name], [name] ([id number]) -------------------------------------------------------------------------------- SuperBill Details Patient Name: [place], Pablo A. Date of Service: 01/04/2016 Medical Record Patient Account Number: [id number] Number: Treating RN: Baruch Gouty, RN, BSN, Rita [birth year] [phone] y.o. Other Clinician: Date of Birth/Sex: Male) Treating ROBSON, MICHAEL Primary Care Physician: Viviana Simpler Physician/Extender: G Referring Physician: Marily Memos in Treatment:  3 Diagnosis Coding ICD-10 Codes Code Description E11.621 Type 2 diabetes mellitus with foot ulcer E11.42 Type 2 diabetes mellitus with diabetic polyneuropathy Facility Procedures CPT4 Code: [id number] Description: [id number] - DEB SUBQ TISSUE 20 SQ CM/< ICD-10 Description Diagnosis E11.621 Type 2 diabetes mellitus with foot ulcer Modifier: Quantity: 1 Physician Procedures CPT4 Code: [id number] Description: [id number] - WC PHYS SUBQ TISS 20 SQ CM ICD-10 Description Diagnosis E11.621 Type 2 diabetes mellitus with foot ulcer Modifier: Quantity: 1 Electronic Signature(s) Signed: 01/04/2016 4:35:27 PM By: Linton Ham MD Entered By: Linton Ham on 01/04/2016 14:06:05

## 2016-01-04 NOTE — Progress Notes (Signed)
Kubly, KAILUM OUIMETTE ([id number]) Visit Report for 01/04/2016 Arrival Information Details Patient Name: Grumbine, DIMARCO A. Date of Service: 01/04/2016 1:30 PM Medical Record Number: [id number] Patient Account Number: [id number] Date of Birth/Sex: [birth year] (60 y.o. Male) Treating RN: Afful, RN, BSN, Velva Harman Primary Care Physician: Viviana Simpler Other Clinician: Referring Physician: Viviana Simpler Treating Physician/Extender: Tito Dine in Treatment: 3 Visit Information History Since Last Visit Added or deleted any medications: No Patient Arrived: Ambulatory Any new allergies or adverse reactions: No Arrival Time: 13:25 Had a fall or experienced change in No Accompanied By: SELF activities of daily living that may affect Transfer Assistance: None risk of falls: Patient Identification Verified: Yes Signs or symptoms of abuse/neglect since last No Secondary Verification Process Yes visito Completed: Hospitalized since last visit: No Patient Requires Transmission-Based No Has Dressing in Place as Prescribed: Yes Precautions: Pain Present Now: No Patient Has Alerts: Yes Patient Alerts: HgbA1c 7.0 Electronic Signature(s) Signed: 01/04/2016 4:40:39 PM By: Regan Lemming BSN, RN Entered By: Regan Lemming on 01/04/2016 13:25:30 Kulick, Gerda Diss ([id number]) -------------------------------------------------------------------------------- Encounter Discharge Information Details Patient Name: Raimer, Hensley A. Date of Service: 01/04/2016 1:30 PM Medical Record Number: [id number] Patient Account Number: [id number] Date of Birth/Sex: [birth year] (60 y.o. Male) Treating RN: Afful, RN, BSN, Velva Harman Primary Care Physician: Viviana Simpler Other Clinician: Referring Physician: Viviana Simpler Treating Physician/Extender: Tito Dine in Treatment: 3 Encounter Discharge Information Items Discharge Pain Level: 0 Discharge Condition: Stable Ambulatory Status: Ambulatory Discharge  Destination: Home Transportation: Private Auto Accompanied By: wife Schedule Follow-up Appointment: No Medication Reconciliation completed and provided to Patient/Care No Zeina Akkerman: Provided on Clinical Summary of Care: 01/04/2016 Form Type Recipient Paper Patient JW Electronic Signature(s) Signed: 01/04/2016 4:29:26 PM By: Regan Lemming BSN, RN Previous Signature: 01/04/2016 1:48:30 PM Version By: Ruthine Dose Entered By: Regan Lemming on 01/04/2016 16:29:26 Loera, Gerda Diss ([id number]) -------------------------------------------------------------------------------- Lower Extremity Assessment Details Patient Name: Schwabe, Loris A. Date of Service: 01/04/2016 1:30 PM Medical Record Number: [id number] Patient Account Number: [id number] Date of Birth/Sex: Mar 29, 1956 (60 y.o. Male) Treating RN: Afful, RN, BSN, Velva Harman Primary Care Physician: Viviana Simpler Other Clinician: Referring Physician: Viviana Simpler Treating Physician/Extender: Tito Dine in Treatment: 3 Vascular Assessment Pulses: Posterior Tibial Dorsalis Pedis Palpable: [Right:Yes] Extremity colors, hair growth, and conditions: Extremity Color: [Right:Normal] Hair Growth on Extremity: [Right:No] Temperature of Extremity: [Right:Warm] Capillary Refill: [Right:< 3 seconds] Toe Nail Assessment Left: Right: Thick: No Discolored: No Deformed: No Improper Length and Hygiene: No Electronic Signature(s) Signed: 01/04/2016 1:34:54 PM By: Regan Lemming BSN, RN Entered By: Regan Lemming on 01/04/2016 13:34:54 Eyster, Gerda Diss ([id number]) -------------------------------------------------------------------------------- Multi Wound Chart Details Patient Name: Piscopo, Ala A. Date of Service: 01/04/2016 1:30 PM Medical Record Number: [id number] Patient Account Number: [id number] Date of Birth/Sex: August 10, 1955 (60 y.o. Male) Treating RN: Afful, RN, BSN, Velva Harman Primary Care Physician: Viviana Simpler Other Clinician: Referring  Physician: Viviana Simpler Treating Physician/Extender: Tito Dine in Treatment: 3 Vital Signs Height(in): 72 Pulse(bpm): 103 Weight(lbs): 223 Blood Pressure 160/85 (mmHg): Body Mass Index(BMI): 30 Temperature(F): 98.2 Respiratory Rate 16 (breaths/min): Photos: [1:No Photos] [N/A:N/A] Wound Location: [1:Foot - Plantar] [N/A:N/A] Wounding Event: [1:Gradually Appeared] [N/A:N/A] Primary Etiology: [1:Diabetic Wound/Ulcer of the Lower Extremity] [N/A:N/A] Comorbid History: [1:Hypertension, Type II Diabetes, Gout] [N/A:N/A] Date Acquired: [1:08/03/2015] [N/A:N/A] Weeks of Treatment: [1:3] [N/A:N/A] Wound Status: [1:Open] [N/A:N/A] Measurements L x W x D 1.2x1x0.2 [N/A:N/A] (cm) Area (cm) : [1:0.942] [N/A:N/A] Volume (cm) : [1:0.188] [N/A:N/A] % Reduction in Area: [1:42.90%] [N/A:N/A] % Reduction in Volume:  71.50% [N/A:N/A] Classification: [1:Grade 1] [N/A:N/A] Exudate Amount: [1:Medium] [N/A:N/A] Exudate Type: [1:Serosanguineous] [N/A:N/A] Exudate Color: [1:red, brown] [N/A:N/A] Wound Margin: [1:Distinct, outline attached] [N/A:N/A] Granulation Amount: [1:Large (67-100%)] [N/A:N/A] Granulation Quality: [1:Red] [N/A:N/A] Necrotic Amount: [1:Small (1-33%)] [N/A:N/A] Exposed Structures: [1:Fascia: No Fat: No Tendon: No Muscle: No Joint: No Bone: No] [N/A:N/A] Limited to Skin Breakdown Epithelialization: None N/A N/A Periwound Skin Texture: Callus: Yes N/A N/A Edema: No Excoriation: No Induration: No Crepitus: No Fluctuance: No Friable: No Rash: No Scarring: No Periwound Skin Maceration: Yes N/A N/A Moisture: Moist: Yes Dry/Scaly: No Periwound Skin Color: Atrophie Blanche: No N/A N/A Cyanosis: No Ecchymosis: No Erythema: No Hemosiderin Staining: No Mottled: No Pallor: No Rubor: No Temperature: No Abnormality N/A N/A Tenderness on Yes N/A N/A Palpation: Wound Preparation: Ulcer Cleansing: N/A N/A Rinsed/Irrigated with Saline Topical  Anesthetic Applied: Other: lidocaine 4% Treatment Notes Electronic Signature(s) Signed: 01/04/2016 4:40:39 PM By: Regan Lemming BSN, RN Entered By: Regan Lemming on 01/04/2016 13:38:50 Penza, Gerda Diss ([id number]) -------------------------------------------------------------------------------- [place] Details Patient Name: Gordin, Thorsten A. Date of Service: 01/04/2016 1:30 PM Medical Record Number: [id number] Patient Account Number: [id number] Date of Birth/Sex: June 02, 1956 (60 y.o. Male) Treating RN: Afful, RN, BSN, Velva Harman Primary Care Physician: Viviana Simpler Other Clinician: Referring Physician: Viviana Simpler Treating Physician/Extender: Tito Dine in Treatment: 3 Active Inactive Abuse / Safety / Falls / Self Care Management Nursing Diagnoses: Impaired home maintenance Impaired physical mobility Knowledge deficit related to abuse or neglect Knowledge deficit related to: safety; personal, health (wound), emergency Potential for falls Goals: Patient will remain injury free Date Initiated: 12/14/2015 Goal Status: Active Patient/caregiver will verbalize understanding of skin care regimen Date Initiated: 12/14/2015 Goal Status: Active Patient/caregiver will verbalize/demonstrate measure taken to improve self care Date Initiated: 12/14/2015 Goal Status: Active Patient/caregiver will verbalize/demonstrate measures taken to improve the patient's personal safety Date Initiated: 12/14/2015 Goal Status: Active Patient/caregiver will verbalize/demonstrate measures taken to prevent injury and/or falls Date Initiated: 12/14/2015 Goal Status: Active Patient/caregiver will verbalize/demonstrate understanding of what to do in case of emergency Date Initiated: 12/14/2015 Goal Status: Active Interventions: Assess fall risk on admission and as needed Assess: immobility, friction, shearing, incontinence upon admission and as needed Assess impairment of mobility on  admission and as needed per policy Assess self care needs on admission and as needed Patient referred to community resources (specify in notes) Wigger, CAMARI TRAVERS. ([id number]) Provide education on basic hygiene Provide education on personal and home safety Provide education on safe transfers Provide education on vaccinations Treatment Activities: Education provided on Basic Hygiene : 12/28/2015 Notes: Orientation to the Wound Care Program Nursing Diagnoses: Knowledge deficit related to the wound healing center program Goals: Patient/caregiver will verbalize understanding of the [place] Program Date Initiated: 12/14/2015 Goal Status: Active Interventions: Provide education on orientation to the wound center Notes: Peripheral Neuropathy Nursing Diagnoses: Knowledge deficit related to disease process and management of peripheral neurovascular dysfunction Potential alteration in peripheral tissue perfusion (select prior to confirmation of diagnosis) Goals: Patient/caregiver will verbalize understanding of disease process and disease management Date Initiated: 12/14/2015 Goal Status: Active Interventions: Assess signs and symptoms of neuropathy upon admission and as needed Provide education on Management of Neuropathy and Related Ulcers Provide education on Management of Neuropathy upon discharge from the [facility] Treatment Activities: Patient referred for customized footwear/offloading : 12/14/2015 Notes: Wound/Skin Impairment Caesar, NYEL BACAK. ([id number]) Nursing Diagnoses: Impaired tissue integrity Knowledge deficit related to ulceration/compromised skin integrity Goals: Patient/caregiver will verbalize understanding of skin care regimen Date  Initiated: 12/14/2015 Goal Status: Active Ulcer/skin breakdown will have a volume reduction of 30% by week 4 Date Initiated: 12/14/2015 Goal Status: Active Ulcer/skin breakdown will have a volume reduction of 50% by week 8 Date  Initiated: 12/14/2015 Goal Status: Active Ulcer/skin breakdown will have a volume reduction of 80% by week 12 Date Initiated: 12/14/2015 Goal Status: Active Ulcer/skin breakdown will heal within 14 weeks Date Initiated: 12/14/2015 Goal Status: Active Interventions: Assess patient/caregiver ability to obtain necessary supplies Assess patient/caregiver ability to perform ulcer/skin care regimen upon admission and as needed Assess ulceration(s) every visit Provide education on ulcer and skin care Treatment Activities: Referred to DME Azara Gemme for dressing supplies : 12/14/2015 Skin care regimen initiated : 12/14/2015 Topical wound management initiated : 12/14/2015 Notes: Electronic Signature(s) Signed: 01/04/2016 4:40:39 PM By: Regan Lemming BSN, RN Entered By: Regan Lemming on 01/04/2016 13:38:35 Deshazo, Gerda Diss ([id number]) -------------------------------------------------------------------------------- Pain Assessment Details Patient Name: Frank, Erbie A. Date of Service: 01/04/2016 1:30 PM Medical Record Number: [id number] Patient Account Number: [id number] Date of Birth/Sex: 1956/03/31 (60 y.o. Male) Treating RN: Afful, RN, BSN, Velva Harman Primary Care Physician: Viviana Simpler Other Clinician: Referring Physician: Viviana Simpler Treating Physician/Extender: Tito Dine in Treatment: 3 Active Problems Location of Pain Severity and Description of Pain Patient Has Paino No Site Locations With Dressing Change: No Pain Management and Medication Current Pain Management: Electronic Signature(s) Signed: 01/04/2016 4:40:39 PM By: Regan Lemming BSN, RN Entered By: Regan Lemming on 01/04/2016 13:26:55 Timpone, Gerda Diss ([id number]) -------------------------------------------------------------------------------- Patient/Caregiver Education Details Patient Name: Donate, Laverne A. Date of Service: 01/04/2016 1:30 PM Medical Record Number: [id number] Patient Account Number: [id number] Date of  Birth/Gender: 1956-04-02 (60 y.o. Male) Treating RN: Afful, RN, BSN, Velva Harman Primary Care Physician: Viviana Simpler Other Clinician: Referring Physician: Viviana Simpler Treating Physician/Extender: Tito Dine in Treatment: 3 Education Assessment Education Provided To: Patient Education Topics Provided Basic Hygiene: Methods: Explain/Verbal Responses: State content correctly Peripheral Neuropathy: Methods: Explain/Verbal Responses: State content correctly Safety: Methods: Explain/Verbal Responses: State content correctly Tissue Oxygenation: Methods: Explain/Verbal Responses: State content correctly Welcome To [facility]: Methods: Explain/Verbal Responses: State content correctly Wound/Skin Impairment: Methods: Explain/Verbal Responses: State content correctly Electronic Signature(s) Signed: 01/04/2016 4:30:10 PM By: Regan Lemming BSN, RN Entered By: Regan Lemming on 01/04/2016 16:30:10 Brinkmeier, Gerda Diss ([id number]) -------------------------------------------------------------------------------- Wound Assessment Details Patient Name: Krausz, Danta A. Date of Service: 01/04/2016 1:30 PM Medical Record Number: [id number] Patient Account Number: [id number] Date of Birth/Sex: [birth year] (60 y.o. Male) Treating RN: Afful, RN, BSN, Velva Harman Primary Care Physician: Viviana Simpler Other Clinician: Referring Physician: Viviana Simpler Treating Physician/Extender: Tito Dine in Treatment: 3 Wound Status Wound Number: 1 Primary Diabetic Wound/Ulcer of the Lower Etiology: Extremity Wound Location: Foot - Plantar Wound Status: Open Wounding Event: Gradually Appeared Comorbid Hypertension, Type II Diabetes, Date Acquired: 08/03/2015 History: Gout Weeks Of Treatment: 3 Clustered Wound: No Wound Measurements Length: (cm) 1.2 Width: (cm) 1 Depth: (cm) 0.2 Area: (cm) 0.942 Volume: (cm) 0.188 % Reduction in Area: 42.9% % Reduction in Volume:  71.5% Epithelialization: None Tunneling: No Undermining: No Wound Description Classification: Grade 1 Wound Margin: Distinct, outline attached Exudate Amount: Medium Exudate Type: Serosanguineous Exudate Color: red, brown Foul Odor After Cleansing: No Wound Bed Granulation Amount: Large (67-100%) Exposed Structure Granulation Quality: Red Fascia Exposed: No Necrotic Amount: Small (1-33%) Fat Layer Exposed: No Necrotic Quality: Adherent Slough Tendon Exposed: No Muscle Exposed: No Joint Exposed: No Bone Exposed: No Limited to Skin Breakdown Periwound Skin Texture Texture Color No  Abnormalities Noted: No No Abnormalities Noted: No Callus: Yes Atrophie Blanche: No Crepitus: No Cyanosis: No Excoriation: No Ecchymosis: No Fluctuance: No Erythema: No Penalver, Joedy A. ([id number]) Friable: No Hemosiderin Staining: No Induration: No Mottled: No Localized Edema: No Pallor: No Rash: No Rubor: No Scarring: No Temperature / Pain Moisture Temperature: No Abnormality No Abnormalities Noted: No Tenderness on Palpation: Yes Dry / Scaly: No Maceration: Yes Moist: Yes Wound Preparation Ulcer Cleansing: Rinsed/Irrigated with Saline Topical Anesthetic Applied: Other: lidocaine 4%, Treatment Notes Wound #1 (Plantar Foot) 1. Cleansed with: Clean wound with Normal Saline 4. Dressing Applied: Hydrafera Blue 5. Secondary Dressing Applied Gauze and Kerlix/Conform 7. Secured with Recruitment consultant) Signed: 01/04/2016 4:40:39 PM By: Regan Lemming BSN, RN Entered By: Regan Lemming on 01/04/2016 13:38:27 Akey, Gerda Diss ([id number]) -------------------------------------------------------------------------------- Vitals Details Patient Name: Jutte, Werner A. Date of Service: 01/04/2016 1:30 PM Medical Record Number: [id number] Patient Account Number: [id number] Date of Birth/Sex: [birth year] (60 y.o. Male) Treating RN: Afful, RN, BSN, Velva Harman Primary Care Physician: Viviana Simpler Other Clinician: Referring Physician: Viviana Simpler Treating Physician/Extender: Tito Dine in Treatment: 3 Vital Signs Time Taken: 13:26 Temperature (F): 98.2 Height (in): 72 Pulse (bpm): 103 Weight (lbs): 223 Respiratory Rate (breaths/min): 16 Body Mass Index (BMI): 30.2 Blood Pressure (mmHg): 160/85 Reference Range: 80 - 120 mg / dl Electronic Signature(s) Signed: 01/04/2016 4:40:39 PM By: Regan Lemming BSN, RN Entered By: Regan Lemming on 01/04/2016 13:27:43

## 2016-01-12 ENCOUNTER — Encounter: Payer: 59 | Admitting: Internal Medicine

## 2016-01-12 DIAGNOSIS — E11621 Type 2 diabetes mellitus with foot ulcer: Secondary | ICD-10-CM | POA: Diagnosis not present

## 2016-01-13 NOTE — Progress Notes (Signed)
Lundblad, HERALDO MIKULAK ([id number]) Visit Report for 01/12/2016 Arrival Information Details Patient Name: [name], [name] Date of Service: 01/12/2016 8:00 AM Medical Record Number: [id number] Patient Account Number: [id number] Date of Birth/Sex: 02-26-1956 (60 y.o. Male) Treating RN: Afful, RN, BSN, Velva Harman Primary Care Physician: Viviana Simpler Other Clinician: Referring Physician: Viviana Simpler Treating Physician/Extender: Tito Dine in Treatment: 4 Visit Information History Since Last Visit Added or deleted any medications: No Patient Arrived: Ambulatory Any new allergies or adverse reactions: No Arrival Time: 08:08 Had a fall or experienced change in No Accompanied By: self activities of daily living that may affect Transfer Assistance: None risk of falls: Patient Identification Verified: Yes Signs or symptoms of abuse/neglect since last No Secondary Verification Process Yes visito Completed: Hospitalized since last visit: No Patient Requires Transmission-Based No Has Dressing in Place as Prescribed: Yes Precautions: Has Footwear/Offloading in Place as Yes Patient Has Alerts: Yes Prescribed: Patient Alerts: HgbA1c Right: Wedge 7.0 Shoe Pain Present Now: No Electronic Signature(s) Signed: 01/12/2016 4:58:21 PM By: Regan Lemming BSN, RN Entered By: Regan Lemming on 01/12/2016 08:09:35 [name], [name] ([id number]) -------------------------------------------------------------------------------- Encounter Discharge Information Details Patient Name: [name], [name] Date of Service: 01/12/2016 8:00 AM Medical Record Number: [id number] Patient Account Number: [id number] Date of Birth/Sex: 28-Sep-1955 (60 y.o. Male) Treating RN: Afful, RN, BSN, Velva Harman Primary Care Physician: Viviana Simpler Other Clinician: Referring Physician: Viviana Simpler Treating Physician/Extender: Tito Dine in Treatment: 4 Encounter Discharge Information Items Discharge Pain Level:  0 Discharge Condition: Stable Ambulatory Status: Ambulatory Discharge Destination: Home Transportation: Private Auto Accompanied By: self Schedule Follow-up Appointment: No Medication Reconciliation completed and provided to Patient/Care No Edie Vallandingham: Provided on Clinical Summary of Care: 01/12/2016 Form Type Recipient Paper Patient JW Electronic Signature(s) Signed: 01/12/2016 8:35:59 AM By: Ruthine Dose Entered By: Ruthine Dose on 01/12/2016 08:35:59 [name], [name] ([id number]) -------------------------------------------------------------------------------- Lower Extremity Assessment Details Patient Name: [name], [name] Date of Service: 01/12/2016 8:00 AM Medical Record Number: [id number] Patient Account Number: [id number] Date of Birth/Sex: [birth year] (60 y.o. Male) Treating RN: Afful, RN, BSN, Velva Harman Primary Care Physician: Viviana Simpler Other Clinician: Referring Physician: Viviana Simpler Treating Physician/Extender: Tito Dine in Treatment: 4 Vascular Assessment Pulses: Posterior Tibial Dorsalis Pedis Palpable: [Right:Yes] Extremity colors, hair growth, and conditions: Extremity Color: [Right:Normal] Hair Growth on Extremity: [Right:Yes] Temperature of Extremity: [Right:Warm] Capillary Refill: [Right:< 3 seconds] Electronic Signature(s) Signed: 01/12/2016 4:58:21 PM By: Regan Lemming BSN, RN Entered By: Regan Lemming on 01/12/2016 08:11:33 [name], [name] ([id number]) -------------------------------------------------------------------------------- Multi Wound Chart Details Patient Name: [name], [name] Date of Service: 01/12/2016 8:00 AM Medical Record Number: [id number] Patient Account Number: [id number] Date of Birth/Sex: Sep 10, 1955 (60 y.o. Male) Treating RN: Afful, RN, BSN, Velva Harman Primary Care Physician: Viviana Simpler Other Clinician: Referring Physician: Viviana Simpler Treating Physician/Extender: Tito Dine in Treatment: 4 Vital  Signs Height(in): 72 Pulse(bpm): 87 Weight(lbs): 223 Blood Pressure 144/90 (mmHg): Body Mass Index(BMI): 30 Temperature(F): 98.2 Respiratory Rate 17 (breaths/min): Photos: [1:No Photos] [N/A:N/A] Wound Location: [1:Foot - Plantar] [N/A:N/A] Wounding Event: [1:Gradually Appeared] [N/A:N/A] Primary Etiology: [1:Diabetic Wound/Ulcer of the Lower Extremity] [N/A:N/A] Comorbid History: [1:Hypertension, Type II Diabetes, Gout] [N/A:N/A] Date Acquired: [1:08/03/2015] [N/A:N/A] Weeks of Treatment: [1:4] [N/A:N/A] Wound Status: [1:Open] [N/A:N/A] Measurements L x W x D 1.2x1.2x0.2 [N/A:N/A] (cm) Area (cm) : [1:1.131] [N/A:N/A] Volume (cm) : [1:0.226] [N/A:N/A] % Reduction in Area: [1:31.40%] [N/A:N/A] % Reduction in Volume: 65.80% [N/A:N/A] Classification: [1:Grade 1] [N/A:N/A] Exudate Amount: [1:Medium] [N/A:N/A] Exudate Type: [1:Serosanguineous] [N/A:N/A] Exudate Color: [1:red,  brown] [N/A:N/A] Wound Margin: [1:Distinct, outline attached] [N/A:N/A] Granulation Amount: [1:Large (67-100%)] [N/A:N/A] Granulation Quality: [1:Pink, Pale] [N/A:N/A] Necrotic Amount: [1:None Present (0%)] [N/A:N/A] Exposed Structures: [1:Fascia: No Fat: No Tendon: No Muscle: No Joint: No Bone: No] [N/A:N/A] Limited to Skin Breakdown Epithelialization: None N/A N/A Periwound Skin Texture: Callus: Yes N/A N/A Edema: No Excoriation: No Induration: No Crepitus: No Fluctuance: No Friable: No Rash: No Scarring: No Periwound Skin Maceration: Yes N/A N/A Moisture: Moist: Yes Dry/Scaly: No Periwound Skin Color: Atrophie Blanche: No N/A N/A Cyanosis: No Ecchymosis: No Erythema: No Hemosiderin Staining: No Mottled: No Pallor: No Rubor: No Temperature: No Abnormality N/A N/A Tenderness on Yes N/A N/A Palpation: Wound Preparation: Ulcer Cleansing: N/A N/A Rinsed/Irrigated with Saline Topical Anesthetic Applied: Other: lidocaine 4% Treatment Notes Electronic Signature(s) Signed:  01/12/2016 4:58:21 PM By: Regan Lemming BSN, RN Entered By: Regan Lemming on 01/12/2016 08:21:36 [name], [name] ([id number]) -------------------------------------------------------------------------------- [place] Details Patient Name: [name], [name] Date of Service: 01/12/2016 8:00 AM Medical Record Number: [id number] Patient Account Number: [id number] Date of Birth/Sex: [birth year] (60 y.o. Male) Treating RN: Afful, RN, BSN, Velva Harman Primary Care Physician: Viviana Simpler Other Clinician: Referring Physician: Viviana Simpler Treating Physician/Extender: Tito Dine in Treatment: 4 Active Inactive Abuse / Safety / Falls / Self Care Management Nursing Diagnoses: Impaired home maintenance Impaired physical mobility Knowledge deficit related to abuse or neglect Knowledge deficit related to: safety; personal, health (wound), emergency Potential for falls Goals: Patient will remain injury free Date Initiated: 12/14/2015 Goal Status: Active Patient/caregiver will verbalize understanding of skin care regimen Date Initiated: 12/14/2015 Goal Status: Active Patient/caregiver will verbalize/demonstrate measure taken to improve self care Date Initiated: 12/14/2015 Goal Status: Active Patient/caregiver will verbalize/demonstrate measures taken to improve the patient's personal safety Date Initiated: 12/14/2015 Goal Status: Active Patient/caregiver will verbalize/demonstrate measures taken to prevent injury and/or falls Date Initiated: 12/14/2015 Goal Status: Active Patient/caregiver will verbalize/demonstrate understanding of what to do in case of emergency Date Initiated: 12/14/2015 Goal Status: Active Interventions: Assess fall risk on admission and as needed Assess: immobility, friction, shearing, incontinence upon admission and as needed Assess impairment of mobility on admission and as needed per policy Assess self care needs on admission and as needed Patient  referred to community resources (specify in notes) [name], Bradley ILLES. ([id number]) Provide education on basic hygiene Provide education on personal and home safety Provide education on safe transfers Provide education on vaccinations Treatment Activities: Education provided on Basic Hygiene : 01/04/2016 Notes: Orientation to the Wound Care Program Nursing Diagnoses: Knowledge deficit related to the wound healing center program Goals: Patient/caregiver will verbalize understanding of the [place] Program Date Initiated: 12/14/2015 Goal Status: Active Interventions: Provide education on orientation to the wound center Notes: Peripheral Neuropathy Nursing Diagnoses: Knowledge deficit related to disease process and management of peripheral neurovascular dysfunction Potential alteration in peripheral tissue perfusion (select prior to confirmation of diagnosis) Goals: Patient/caregiver will verbalize understanding of disease process and disease management Date Initiated: 12/14/2015 Goal Status: Active Interventions: Assess signs and symptoms of neuropathy upon admission and as needed Provide education on Management of Neuropathy and Related Ulcers Provide education on Management of Neuropathy upon discharge from the [place] Treatment Activities: Patient referred for customized footwear/offloading : 12/14/2015 Notes: Wound/Skin Impairment [name], [name] ([id number]) Nursing Diagnoses: Impaired tissue integrity Knowledge deficit related to ulceration/compromised skin integrity Goals: Patient/caregiver will verbalize understanding of skin care regimen Date Initiated: 12/14/2015 Goal Status: Active Ulcer/skin breakdown will have a volume reduction of 30% by  week 4 Date Initiated: 12/14/2015 Goal Status: Active Ulcer/skin breakdown will have a volume reduction of 50% by week 8 Date Initiated: 12/14/2015 Goal Status: Active Ulcer/skin breakdown will have a volume reduction  of 80% by week 12 Date Initiated: 12/14/2015 Goal Status: Active Ulcer/skin breakdown will heal within 14 weeks Date Initiated: 12/14/2015 Goal Status: Active Interventions: Assess patient/caregiver ability to obtain necessary supplies Assess patient/caregiver ability to perform ulcer/skin care regimen upon admission and as needed Assess ulceration(s) every visit Provide education on ulcer and skin care Treatment Activities: Referred to DME Hernando Reali for dressing supplies : 12/14/2015 Skin care regimen initiated : 12/14/2015 Topical wound management initiated : 12/14/2015 Notes: Electronic Signature(s) Signed: 01/12/2016 4:58:21 PM By: Regan Lemming BSN, RN Entered By: Regan Lemming on 01/12/2016 08:17:31 [place], [name] ([id number]) -------------------------------------------------------------------------------- Pain Assessment Details Patient Name: [name], [name] Date of Service: 01/12/2016 8:00 AM Medical Record Number: [id number] Patient Account Number: [id number] Date of Birth/Sex: 10-23-1955 (60 y.o. Male) Treating RN: Afful, RN, BSN, Velva Harman Primary Care Physician: Viviana Simpler Other Clinician: Referring Physician: Viviana Simpler Treating Physician/Extender: Tito Dine in Treatment: 4 Active Problems Location of Pain Severity and Description of Pain Patient Has Paino No Site Locations With Dressing Change: No Pain Management and Medication Current Pain Management: Electronic Signature(s) Signed: 01/12/2016 4:58:21 PM By: Regan Lemming BSN, RN Entered By: Regan Lemming on 01/12/2016 08:09:42 [name], [name] ([id number]) -------------------------------------------------------------------------------- Patient/Caregiver Education Details Patient Name: [name], [name] Date of Service: 01/12/2016 8:00 AM Medical Record Number: [id number] Patient Account Number: [id number] Date of Birth/Gender: 02/19/1956 (60 y.o. Male) Treating RN: Afful, RN, BSN, Velva Harman Primary Care  Physician: Viviana Simpler Other Clinician: Referring Physician: Viviana Simpler Treating Physician/Extender: Tito Dine in Treatment: 4 Education Assessment Education Provided To: Patient Education Topics Provided Basic Hygiene: Methods: Explain/Verbal Responses: State content correctly Peripheral Neuropathy: Methods: Explain/Verbal Responses: State content correctly Safety: Methods: Explain/Verbal Responses: State content correctly Tissue Oxygenation: Methods: Explain/Verbal Responses: State content correctly Welcome To [facility] [place]: Methods: Explain/Verbal Responses: State content correctly Wound/Skin Impairment: Methods: Explain/Verbal Responses: State content correctly Electronic Signature(s) Signed: 01/12/2016 4:58:21 PM By: Regan Lemming BSN, RN Entered By: Regan Lemming on 01/12/2016 [place], [name] ([id number]) -------------------------------------------------------------------------------- Wound Assessment Details Patient Name: [name], [name] Date of Service: 01/12/2016 8:00 AM Medical Record Number: [id number] Patient Account Number: [id number] Date of Birth/Sex: 06/07/1956 (60 y.o. Male) Treating RN: Afful, RN, BSN, Velva Harman Primary Care Physician: Viviana Simpler Other Clinician: Referring Physician: Viviana Simpler Treating Physician/Extender: Tito Dine in Treatment: 4 Wound Status Wound Number: 1 Primary Diabetic Wound/Ulcer of the Lower Etiology: Extremity Wound Location: Foot - Plantar Wound Status: Open Wounding Event: Gradually Appeared Comorbid Hypertension, Type II Diabetes, Date Acquired: 08/03/2015 History: Gout Weeks Of Treatment: 4 Clustered Wound: No Photos Photo Uploaded By: Regan Lemming on 01/12/2016 16:57:52 Wound Measurements Length: (cm) 1.2 Width: (cm) 1.2 Depth: (cm) 0.2 Area: (cm) 1.131 Volume: (cm) 0.226 % Reduction in Area: 31.4% % Reduction in Volume: 65.8% Epithelialization:  None Tunneling: No Wound Description Classification: Grade 1 Wound Margin: Distinct, outline attached Exudate Amount: Medium Exudate Type: Serosanguineous Exudate Color: red, brown Foul Odor After Cleansing: No Wound Bed Granulation Amount: Large (67-100%) Exposed Structure Granulation Quality: Pink, Pale Fascia Exposed: No Necrotic Amount: None Present (0%) Fat Layer Exposed: No Tendon Exposed: No [name], [name] ([id number]) Muscle Exposed: No Joint Exposed: No Bone Exposed: No Limited to Skin Breakdown Periwound Skin Texture Texture Color No Abnormalities Noted: No No Abnormalities Noted:  No Callus: Yes Atrophie Blanche: No Crepitus: No Cyanosis: No Excoriation: No Ecchymosis: No Fluctuance: No Erythema: No Friable: No Hemosiderin Staining: No Induration: No Mottled: No Localized Edema: No Pallor: No Rash: No Rubor: No Scarring: No Temperature / Pain Moisture Temperature: No Abnormality No Abnormalities Noted: No Tenderness on Palpation: Yes Dry / Scaly: No Maceration: Yes Moist: Yes Wound Preparation Ulcer Cleansing: Rinsed/Irrigated with Saline Topical Anesthetic Applied: Other: lidocaine 4%, Treatment Notes Wound #1 (Plantar Foot) 1. Cleansed with: Clean wound with Normal Saline 4. Dressing Applied: Hydrafera Blue 5. Secondary Dressing Applied Gauze and Kerlix/Conform 6. Footwear/Offloading device applied Felt/Foam Surgical shoe 7. Secured with Tape Notes front Dietitian) Signed: 01/12/2016 4:58:21 PM By: Regan Lemming BSN, RN Entered By: Regan Lemming on 01/12/2016 08:14:43 [name], [name] ([id number]) [place], [name] ([id number]) -------------------------------------------------------------------------------- Vitals Details Patient Name: Lina, Algie A. Date of Service: 01/12/2016 8:00 AM Medical Record Number: [id number] Patient Account Number: [id number] Date of Birth/Sex: 04/21/1956 (60 y.o. Male) Treating RN: Afful,  RN, BSN, Velva Harman Primary Care Physician: Viviana Simpler Other Clinician: Referring Physician: Viviana Simpler Treating Physician/Extender: Tito Dine in Treatment: 4 Vital Signs Time Taken: 08:10 Temperature (F): 98.2 Height (in): 72 Pulse (bpm): 87 Weight (lbs): 223 Respiratory Rate (breaths/min): 17 Body Mass Index (BMI): 30.2 Blood Pressure (mmHg): 144/90 Reference Range: 80 - 120 mg / dl Electronic Signature(s) Signed: 01/12/2016 4:58:21 PM By: Regan Lemming BSN, RN Entered By: Regan Lemming on 01/12/2016 08:11:23

## 2016-01-13 NOTE — Progress Notes (Signed)
[name] ([id number]) Visit Report for 01/12/2016 Chief Complaint Document Details Patient Name: [name], [name] Date of Service: 01/12/2016 8:00 AM Medical Record Patient Account Number: [id number] Number: Treating RN: Baruch Gouty, RN, BSN, Rita 1955/08/30 [phone] y.o. Other Clinician: Date of Birth/Sex: Male) Treating Jamilex Bohnsack Primary Care Physician: Viviana Simpler Physician/Extender: G Referring Physician: Marily Memos in Treatment: 4 Information Obtained from: Patient Chief Complaint 12/14/15; this is a 60 year old man who is a type II diabetic with diabetic neuropathy on insulin and metformin. Presents with a nonhealing ulcer on his right foot since January of this year Electronic Signature(s) Signed: 01/12/2016 4:37:54 PM By: Linton Ham MD Entered By: Linton Ham on 01/12/2016 08:39:36 Waldridge, Gerda Diss ([id number]) -------------------------------------------------------------------------------- Debridement Details Patient Name: [name], [name] Date of Service: 01/12/2016 8:00 AM Medical Record Patient Account Number: [id number] Number: Treating RN: Baruch Gouty, RN, BSN, Rita [birth year] [phone] y.o. Other Clinician: Date of Birth/Sex: Male) Treating Etan Vasudevan, [place] Primary Care Physician: Viviana Simpler Physician/Extender: G Referring Physician: Marily Memos in Treatment: 4 Debridement Performed for Wound #1 Plantar Foot Assessment: Performed By: Physician Ricard Dillon, MD Debridement: Debridement Pre-procedure Yes Verification/Time Out Taken: Start Time: 08:20 Pain Control: Lidocaine 4% Topical Solution Level: Skin/Subcutaneous Tissue Total Area Debrided (L x 1.2 (cm) x 1.2 (cm) = 1.44 (cm) W): Tissue and other Non-Viable, Callus, Fibrin/Slough, Muscle, Subcutaneous material debrided: Instrument: Curette Bleeding: Minimum Hemostasis Achieved: Pressure End Time: 08:25 Procedural Pain: 0 Post Procedural Pain:  0 Response to Treatment: Procedure was tolerated well Post Debridement Measurements of Total Wound Length: (cm) 1.2 Width: (cm) 1.2 Depth: (cm) 0.2 Volume: (cm) 0.226 Post Procedure Diagnosis Same as Pre-procedure Electronic Signature(s) Signed: 01/12/2016 4:37:54 PM By: Linton Ham MD Signed: 01/12/2016 4:58:21 PM By: Regan Lemming BSN, RN Entered By: Linton Ham on 01/12/2016 08:46:20 Barz, Gerda Diss ([id number]) -------------------------------------------------------------------------------- HPI Details Patient Name: [name], [name] Date of Service: 01/12/2016 8:00 AM Medical Record Patient Account Number: [id number] Number: Treating RN: Baruch Gouty, RN, BSN, Rita March 05, 1956 [phone] y.o. Other Clinician: Date of Birth/Sex: Male) Treating [name] Primary Care Physician: Viviana Simpler Physician/Extender: G Referring Physician: Marily Memos in Treatment: 4 History of Present Illness HPI Description: 12/14/15; this is a 60 year old type II diabetic on insulin with diabetic polyneuropathy. He apparently had a callus at roughly his fourth metatarsal head in January he was attempting to remove however he ended up with a ulcer. His wife who was present said they have been using saline to clean this wound and using Polysporin and Betadine- but not making much progress. His previous surgery by his podiatrist Dr. Milinda Pointer in 2014 but does not wish to go back to see Dr. Milinda Pointer and was referred here instead. He has not been on any antibiotics and has not had recent x-rays of the foot. He has no known polyneuropathy and no prior wound history [facility] appears that the patient had foot corrective surgery on 04/02/13 this consisted of an [facility] bunion repair on the right second metatarsal, osteotomy of the right fifth metatarsal, osteotomy of the right hammertoe repair on #2 #3 in #5. Sometime after this he injured the foot he was noted to have  fractures with fracture of the first metatarsal osteotomy fracture of the second metatarsal osteotomy dislocation of the third metatarsal phalangeal joint and fracture of the fifth metatarsal osteotomy site here and I believe he went back to the operating room and had internal fixation of the right foot. He has  not had any recent x-rays of the right foot. He had an MRI of the right foot in 2014 last hemoglobin A1c I see is at 7.1 on 10/29/15. He had a normal serum albumin on 04/29/15 of 4.8. He does not have any nutritional issues 12/21/15; x-ray of the foot that showed no acute bony abnormality no radiopaque foreign bodies. Postsurgical changes in the right first metatarsal were stable healing of fractures in the distal second metatarsal, old healed right fifth metatarsal fracture cortical erosions noted in the right third and fourth metatarsals. We have been using Prisma 12/28/15 the area on his right foot at roughly the fourth metatarsal head is unchanged. Still some circumferential callus and nonviable subcutaneous tissue which requires debridement. No obvious infection 01/04/16; wound is unchanged. Notable for less circumferential callus. Still non-viable subcutaneous tissue which requires debridement. No obvious infection. The patient did not come prepared for a total contact cast we will put this on next week 01/12/16; dimensions of the wound unchanged. The patient did not want a total contact cast today but is prepared to talk about this in 2-3 weeks if there is no progression towards resolution. 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 Date of Service: 01/12/2016 8:00  AM Medical Record Patient Account Number: [id number] Number: Treating RN: Baruch Gouty, RN, BSN, Rita 23-Jul-1956 [phone] y.o. Other Clinician: Date of Birth/Sex: Male) Treating Arron Tetrault Primary Care Physician: Viviana Simpler Physician/Extender: G Referring Physician: Marily Memos in Treatment: 4 Constitutional Sitting or standing Blood Pressure is within target range for patient.. Pulse regular and within target range for patient.Marland Kitchen Respirations regular, non-labored and within target range.. Temperature is normal and within the target range for the patient.. Cardiovascular Pedal pulses palpable and strong bilaterally.. Integumentary (Hair, Skin) There is no surrounding erythema or subcutaneous involvement in the wound. Notes Wound exam; the areas on the plantar aspect of his right fourth metatarsal head previous surgery on the foot noted. There is no overt erythema around the wound or drainage. He has less circumferential callus but still requires a fairly aggressive debridement of callus, nonviable skin and subcutaneous tissue as well as sur nonviable muscle ,face slough, on the wound. After debridement things really seem to clean up quite nicely for over there are no changes in the dimensions or depth of. Still some undermining noted Electronic Signature(s) Signed: 01/12/2016 4:37:54 PM By: Linton Ham MD Entered By: Linton Ham on 01/12/2016 08:43:43 Goines, Gerda Diss ([id number]) -------------------------------------------------------------------------------- Physician Orders Details Patient Name: Patrone, Allah A. Date of Service: 01/12/2016 8:00 AM Medical Record Patient Account Number: [id number] Number: Treating RN: Baruch Gouty, RN, BSN, Rita 05/31/1956 [phone] y.o. Other Clinician: Date of Birth/Sex: Male) Treating Avian Greenawalt Primary Care Physician: Viviana Simpler Physician/Extender: G Referring Physician: Marily Memos in Treatment:  4 Verbal / Phone Orders: Yes Clinician: Afful, RN, BSN, Rita Read Back and Verified: Yes Diagnosis Coding Wound Cleansing Wound #1 Plantar Foot o Cleanse wound with mild soap and water o May Shower, gently pat wound dry prior to applying new dressing. o May shower with protection. Primary Wound Dressing Wound #1 Plantar Foot o Hydrafera Blue Secondary Dressing Wound #1 Plantar Foot o Gauze and Kerlix/Conform Dressing Change Frequency Wound #1 Plantar Foot o Change dressing every other day. Follow-up Appointments Wound #1  Plantar Foot o Return Appointment in 1 week. 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 o Activity as tolerated Bauernfeind, AVANTAE CERCONE ([id number]) Electronic Signature(s) Signed: 01/12/2016 4:37:54 PM By: Linton Ham MD Signed: 01/12/2016 4:58:21 PM By: Regan Lemming BSN, RN Entered By: Regan Lemming on 01/12/2016 08:24:23 Devers, Gerda Diss ([id number]) -------------------------------------------------------------------------------- Problem List Details Patient Name: Kallenberger, Yazan A. Date of Service: 01/12/2016 8:00 AM Medical Record Patient Account Number: [id number] Number: Treating RN: Baruch Gouty, RN, BSN, Rita [birth year] [phone] y.o. Other Clinician: Date of Birth/Sex: Male) Treating Chisum Habenicht Primary Care Physician: Viviana Simpler Physician/Extender: G Referring Physician: Marily Memos in Treatment: 4 Active Problems ICD-10 Encounter Code Description Active Date Diagnosis E11.621 Type 2 diabetes mellitus with foot ulcer 12/14/2015 Yes E11.42 Type 2 diabetes mellitus with diabetic polyneuropathy 12/14/2015 Yes L97.513 Non-pressure chronic ulcer of other part of right foot with 01/12/2016 Yes necrosis of muscle Inactive Problems Resolved Problems Electronic  Signature(s) Signed: 01/12/2016 4:37:54 PM By: Linton Ham MD Entered By: Linton Ham on 01/12/2016 08:50:02 Sass, Gerda Diss ([id number]) -------------------------------------------------------------------------------- Progress Note Details Patient Name: Cloe, Jordanny A. Date of Service: 01/12/2016 8:00 AM Medical Record Patient Account Number: [id number] Number: Treating RN: Baruch Gouty, RN, BSN, Rita 1955-11-20 [phone] y.o. Other Clinician: Date of Birth/Sex: Male) Treating Orrin Yurkovich Primary Care Physician: Viviana Simpler Physician/Extender: G Referring Physician: Marily Memos in Treatment: 4 Subjective Chief Complaint Information obtained from Patient 12/14/15; this is a 60 year old man who is a type II diabetic with diabetic neuropathy on insulin and metformin. Presents with a nonhealing ulcer on his right foot since January of this year History of Present Illness (HPI) 12/14/15; this is a 60 year old type II diabetic on insulin with diabetic polyneuropathy. He apparently had a callus at roughly his fourth metatarsal head in January he was attempting to remove however he ended up with a ulcer. His wife who was present said they have been using saline to clean this wound and using Polysporin and Betadine- but not making much progress. His previous surgery by his podiatrist Dr. Milinda Pointer in 2014 but does not wish to go back to see Dr. Milinda Pointer and was referred here instead. He has not been on any antibiotics and has not had recent x-rays of the foot. He has no known polyneuropathy and no prior wound history [facility] it appears that the patient had foot corrective surgery on 04/02/13 this consisted of an [facility] bunion repair on the right second metatarsal, osteotomy of the right fifth metatarsal, osteotomy of the right hammertoe repair on #2 #3 in #5. Sometime after this he injured the foot he was noted to have fractures with fracture of the  first metatarsal osteotomy fracture of the second metatarsal osteotomy dislocation of the third metatarsal phalangeal joint and fracture of the fifth metatarsal osteotomy site here and I believe he went back to the operating room and had internal fixation of the right foot. He has not had any recent x-rays of the right foot. He had an MRI of the right foot in 2014 last hemoglobin A1c I see is at 7.1 on 10/29/15. He had a normal serum albumin on 04/29/15 of 4.8. He does not have any nutritional issues 12/21/15; x-ray of the foot that showed no acute bony abnormality no radiopaque foreign bodies. Postsurgical changes in the right first metatarsal were stable healing of  fractures in the distal second metatarsal, old healed right fifth metatarsal fracture cortical erosions noted in the right third and fourth metatarsals. We have been using Prisma 12/28/15 the area on his right foot at roughly the fourth metatarsal head is unchanged. Still some circumferential callus and nonviable subcutaneous tissue which requires debridement. No obvious infection 01/04/16; wound is unchanged. Notable for less circumferential callus. Still non-viable subcutaneous tissue which requires debridement. No obvious infection. The patient did not come prepared for a total contact cast we will put this on next week 01/12/16; dimensions of the wound unchanged. The patient did not want a total contact cast today but is prepared to talk about this in 2-3 weeks if there is no progression towards resolution. 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 Voisin, [place] A. ([id number]) Objective Constitutional Sitting or standing Blood Pressure is within target range for patient.. Pulse regular and within target range for patient.Marland Kitchen Respirations regular, non-labored and within target range.. Temperature is normal and within the target range for the patient.. Vitals Time Taken: 8:10 AM, Height: 72 in,  Weight: 223 lbs, BMI: 30.2, Temperature: 98.2 F, Pulse: 87 bpm, Respiratory Rate: 17 breaths/min, Blood Pressure: 144/90 mmHg. Cardiovascular Pedal pulses palpable and strong bilaterally.. General Notes: Wound exam; the areas on the plantar aspect of his right fourth metatarsal head previous surgery on the foot noted. There is no overt erythema around the wound or drainage. He has less circumferential callus but still requires a fairly aggressive debridement of callus, nonviable skin and subcutaneous tissue as well as sur nonviable muscle ,face slough, on the wound. After debridement things really seem to clean up quite nicely for over there are no changes in the dimensions or depth of. Still some undermining noted Integumentary (Hair, Skin) There is no surrounding erythema or subcutaneous involvement in the wound. Wound #1 status is Open. Original cause of wound was Gradually Appeared. The wound is located on the Plantar Foot. The wound measures 1.2cm length x 1.2cm width x 0.2cm depth; 1.131cm^2 area and 0.226cm^3 volume. The wound is limited to skin breakdown. There is no tunneling noted. There is a medium amount of serosanguineous drainage noted. The wound margin is distinct with the outline attached to the wound base. There is large (67-100%) pink, pale granulation within the wound bed. There is no necrotic tissue within the wound bed. The periwound skin appearance exhibited: Callus, Maceration, Moist. The periwound skin appearance did not exhibit: Crepitus, Excoriation, Fluctuance, Friable, Induration, Localized Edema, Rash, Scarring, Dry/Scaly, Atrophie Blanche, Cyanosis, Ecchymosis, Hemosiderin Staining, Mottled, Pallor, Rubor, Erythema. Periwound temperature was noted as No Abnormality. The periwound has tenderness on palpation. Assessment Active Problems ICD-10 Eleazer, SANDIP GOTTSCHALL ([id number]) E11.621 - Type 2 diabetes mellitus with foot ulcer E11.42 - Type 2 diabetes mellitus with  diabetic polyneuropathy Procedures Wound #1 Wound #1 is a Diabetic Wound/Ulcer of the Lower Extremity located on the Plantar Foot . There was a Skin/Subcutaneous Tissue Debridement [id number]) debridement with total area of 1.44 sq cm performed by Ricard Dillon, MD. with the following instrument(s): Curette to remove Non-Viable tissue/material including Fibrin/Slough, Callus, and Subcutaneous after achieving pain control using Lidocaine 4% Topical Solution. A time out was conducted prior to the start of the procedure. A Minimum amount of bleeding was controlled with Pressure. The procedure was tolerated well with a pain level of 0 throughout and a pain level of 0 following the procedure. Post Debridement Measurements: 1.2cm length x 1.2cm width x  0.2cm depth; 0.226cm^3 volume. Post procedure Diagnosis Wound #1: Same as Pre-Procedure Plan Wound Cleansing: Wound #1 Plantar Foot: Cleanse wound with mild soap and water May Shower, gently pat wound dry prior to applying new dressing. May shower with protection. Primary Wound Dressing: Wound #1 Plantar Foot: Hydrafera Blue Secondary Dressing: Wound #1 Plantar Foot: Gauze and Kerlix/Conform Dressing Change Frequency: Wound #1 Plantar Foot: Change dressing every other day. Follow-up Appointments: Wound #1 Plantar Foot: Return Appointment in 1 week. Off-Loading: Wound #1 Plantar Foot: Open toe surgical shoe to: - Front offloader darco Other: - felt Additional Orders / Instructions: Neer, Mecca A. ([id number]) Wound #1 Plantar Foot: Increase protein intake. Activity as tolerated o #1 we will continue with [facility] for now. Consider changing if no changes in the dimensions next week #2 where using felt pads in the Darco forefoot offload her to further offload. #3 we will have another talk in 2 weeks about total contact casting if there is no change for the better of the overall situation Electronic Signature(s) Signed:  01/12/2016 4:37:54 PM By: Linton Ham MD Entered By: Linton Ham on 01/12/2016 08:45:42 Novelo, Gerda Diss ([id number]) -------------------------------------------------------------------------------- [place] Details Patient Name: Straughter, Marcoantonio A. Date of Service: 01/12/2016 Medical Record Patient Account Number: [id number] Number: Treating RN: Baruch Gouty, RN, BSN, Rita 12-20-1955 [phone] y.o. Other Clinician: Date of Birth/Sex: Male) Treating Raahim Shartzer, [place] Primary Care Physician: Viviana Simpler Physician/Extender: G Referring Physician: Marily Memos in Treatment: 4 Diagnosis Coding ICD-10 Codes Code Description E11.621 Type 2 diabetes mellitus with foot ulcer E11.42 Type 2 diabetes mellitus with diabetic polyneuropathy Facility Procedures CPT4 Code: [id number] Description: [id number] - DEB MUSC/FASCIA 20 SQ CM/< ICD-10 Description Diagnosis E11.621 Type 2 diabetes mellitus with foot ulcer Modifier: Quantity: 1 Physician Procedures CPT4 Code: [id number] Description: [id number] - WC PHYS DEBR MUSCLE/FASCIA 20 SQ CM ICD-10 Description Diagnosis E11.621 Type 2 diabetes mellitus with foot ulcer Modifier: Quantity: 1 Electronic Signature(s) Signed: 01/12/2016 4:37:54 PM By: Linton Ham MD Entered By: Linton Ham on 01/12/2016 08:47:31

## 2016-01-18 ENCOUNTER — Encounter: Payer: 59 | Admitting: Internal Medicine

## 2016-01-18 DIAGNOSIS — E11621 Type 2 diabetes mellitus with foot ulcer: Secondary | ICD-10-CM | POA: Diagnosis not present

## 2016-01-19 NOTE — Progress Notes (Signed)
Cuda, ULYSSE LODICO ([id number]) Visit Report for 01/18/2016 Chief Complaint Document Details Patient Name: Venturella, Knoxx A. Date of Service: 01/18/2016 3:00 PM Medical Record Patient Account Number: [id number] Number: Treating RN: Baruch Gouty, RN, BSN, Rita [birth year] [phone] y.o. Other Clinician: Date of Birth/Sex: Male) Treating Tnia Anglada Primary Care Physician: Viviana Simpler Physician/Extender: G Referring Physician: Marily Memos in Treatment: 5 Information Obtained from: Patient Chief Complaint 12/14/15; this is a 60 year old man who is a type II diabetic with diabetic neuropathy on insulin and metformin. Presents with a nonhealing ulcer on his right foot since January of this year Electronic Signature(s) Signed: 01/19/2016 12:38:30 PM By: Linton Ham MD Entered By: Linton Ham on 01/18/2016 15:27:05 Pertuit, Gerda Diss ([id number]) -------------------------------------------------------------------------------- Debridement Details Patient Name: Angert, Cashel A. Date of Service: 01/18/2016 3:00 PM Medical Record Patient Account Number: [id number] Number: Treating RN: Baruch Gouty, RN, BSN, Rita August 14, 1955 [phone] y.o. Other Clinician: Date of Birth/Sex: Male) Treating Johncarlos Holtsclaw Primary Care Physician: Viviana Simpler Physician/Extender: G Referring Physician: Marily Memos in Treatment: 5 Debridement Performed for Wound #1 Plantar Foot Assessment: Performed By: Physician Ricard Dillon, MD Debridement: Debridement Pre-procedure Yes Verification/Time Out Taken: Start Time: 15:14 Pain Control: Lidocaine 4% Topical Solution Level: Skin/Subcutaneous Tissue Total Area Debrided (L x 0.9 (cm) x 0.9 (cm) = 0.81 (cm) W): Tissue and other Non-Viable, Callus, Fibrin/Slough, Subcutaneous material debrided: Instrument: Curette Bleeding: Minimum Hemostasis Achieved: Pressure End Time: 15:19 Procedural Pain: 0 Post Procedural Pain: 0 Response to  Treatment: Procedure was tolerated well Post Debridement Measurements of Total Wound Length: (cm) 0.9 Width: (cm) 0.9 Depth: (cm) 0.2 Volume: (cm) 0.127 Post Procedure Diagnosis Same as Pre-procedure Electronic Signature(s) Signed: 01/18/2016 4:15:47 PM By: Regan Lemming BSN, RN Signed: 01/19/2016 12:38:30 PM By: Linton Ham MD Entered By: Linton Ham on 01/18/2016 15:26:37 Hudson, Gerda Diss ([id number]) -------------------------------------------------------------------------------- HPI Details Patient Name: Hargens, [name] Date of Service: 01/18/2016 3:00 PM Medical Record Patient Account Number: [id number] Number: Treating RN: Baruch Gouty, RN, BSN, Rita 04/12/1956 [phone] y.o. Other Clinician: Date of Birth/Sex: Male) Treating Madelena Maturin Primary Care Physician: Viviana Simpler Physician/Extender: G Referring Physician: Marily Memos in Treatment: 5 History of Present Illness HPI Description: 12/14/15; this is a 60 year old type II diabetic on insulin with diabetic polyneuropathy. He apparently had a callus at roughly his fourth metatarsal head in January he was attempting to remove however he ended up with a ulcer. His wife who was present said they have been using saline to clean this wound and using Polysporin and Betadine- but not making much progress. His previous surgery by his podiatrist Dr. Milinda Pointer in 2014 but does not wish to go back to see Dr. Milinda Pointer and was referred here instead. He has not been on any antibiotics and has not had recent x-rays of the foot. He has no known polyneuropathy and no prior wound history [facility] appears that the patient had foot corrective surgery on 04/02/13 this consisted of an [facility] bunion repair on the right second metatarsal, osteotomy of the right fifth metatarsal, osteotomy of the right hammertoe repair on #2 #3 in #5. Sometime after this he injured the foot he was noted to have fractures  with fracture of the first metatarsal osteotomy fracture of the second metatarsal osteotomy dislocation of the third metatarsal phalangeal joint and fracture of the fifth metatarsal osteotomy site here and I believe he went back to the operating room and had internal fixation of the right foot. He has not  had any recent x-rays of the right foot. He had an MRI of the right foot in 2014 last hemoglobin A1c I see is at 7.1 on 10/29/15. He had a normal serum albumin on 04/29/15 of 4.8. He does not have any nutritional issues 12/21/15; x-ray of the foot that showed no acute bony abnormality no radiopaque foreign bodies. Postsurgical changes in the right first metatarsal were stable healing of fractures in the distal second metatarsal, old healed right fifth metatarsal fracture cortical erosions noted in the right third and fourth metatarsals. We have been using Prisma 12/28/15 the area on his right foot at roughly the fourth metatarsal head is unchanged. Still some circumferential callus and nonviable subcutaneous tissue which requires debridement. No obvious infection 01/04/16; wound is unchanged. Notable for less circumferential callus. Still non-viable subcutaneous tissue which requires debridement. No obvious infection. The patient did not come prepared for a total contact cast we will put this on next week 01/12/16; dimensions of the wound unchanged. The patient did not want a total contact cast today but is prepared to talk about this in 2-3 weeks if there is no progression towards resolution. 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 01/18/16; I don't see much change in this wound which is on the right fifth metatarsal head. I have not been able to talk him into a total contact cast. He continues to offload this with the Darco forefoot offloading boot, scooter at work [employer] research [employer], [employer]. Engineer, maintenance) Signed: 01/19/2016 12:38:30 PM By: Linton Ham MD Entered By:  Linton Ham on 01/18/2016 15:27:59 Stephen, Gerda Diss ([id number]) [place], Gerda Diss ([id number]) -------------------------------------------------------------------------------- Physical Exam Details Patient Name: Currier, Haley A. Date of Service: 01/18/2016 3:00 PM Medical Record Patient Account Number: [id number] Number: Treating RN: Baruch Gouty, RN, BSN, Rita [birth year] [phone] y.o. Other Clinician: Date of Birth/Sex: Male) Treating Kensley Valladares Primary Care Physician: Viviana Simpler Physician/Extender: G Referring Physician: Marily Memos in Treatment: 5 Notes Wound exam; the areas on the plantar aspect of his right fourth metatarsal head. Still requires an aggressive debridement of circumferential callus nonviable subcutaneous tissue and surface slough over the wound. Debridement once again seems to result in a healthy-looking wound bed and I have get again emphasized that this would probably respond the casting, the patient will here none of it Electronic Signature(s) Signed: 01/19/2016 12:38:30 PM By: Linton Ham MD Entered By: Linton Ham on 01/18/2016 15:29:21 [place]kland, Gerda Diss ([id number]) -------------------------------------------------------------------------------- Physician Orders Details Patient Name: Shean, Ardian A. Date of Service: 01/18/2016 3:00 PM Medical Record Patient Account Number: [id number] Number: Treating RN: Baruch Gouty, RN, BSN, Rita 12/26/1955 [phone] y.o. Other Clinician: Date of Birth/Sex: Male) Treating Anavictoria Wilk Primary Care Physician: Viviana Simpler Physician/Extender: G Referring Physician: Marily Memos in Treatment: 5 Verbal / Phone Orders: Yes Clinician: Afful, RN, BSN, Rita Read Back and Verified: Yes Diagnosis Coding Wound Cleansing Wound #1 Plantar Foot o Cleanse wound with mild soap and water o May Shower, gently pat wound dry prior to applying new dressing. o May shower with  protection. Primary Wound Dressing Wound #1 Plantar Foot o Aquacel Ag Secondary Dressing Wound #1 Plantar Foot o Gauze and Kerlix/Conform Dressing Change Frequency Wound #1 Plantar Foot o Change dressing every other day. Follow-up Appointments Wound #1 Plantar Foot o Return Appointment in 2 weeks. Off-Loading Wound #1 Plantar Foot o Open toe surgical shoe to: - Front offloader darco o Other: - felt Additional Orders / Instructions Wound #1 Plantar Foot   o Increase protein intake. o Activity as tolerated Troia, WESTER TERREL ([id number]) Electronic Signature(s) Signed: 01/18/2016 4:15:47 PM By: Regan Lemming BSN, RN Signed: 01/19/2016 12:38:30 PM By: Linton Ham MD Entered By: Regan Lemming on 01/18/2016 15:17:40 Windle, Gerda Diss ([id number]) -------------------------------------------------------------------------------- Problem List Details Patient Name: Hornsby, Demir A. Date of Service: 01/18/2016 3:00 PM Medical Record Patient Account Number: [id number] Number: Treating RN: Baruch Gouty, RN, BSN, Rita Jan 11, 1956 [phone] y.o. 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 Date of Service:  01/18/2016 3:00 PM Medical Record Patient Account Number: [id number] Number: Treating RN: Baruch Gouty, RN, BSN, Rita [birth year] [phone] y.o. Other Clinician: Date of Birth/Sex: Male) Treating Yanel Dombrosky Primary Care Physician: Viviana Simpler Physician/Extender: G Referring Physician: Marily Memos in Treatment: 5 Subjective Chief Complaint Information obtained from Patient 12/14/15; this is a 60 year old man who is a type II diabetic with diabetic neuropathy on insulin and metformin. Presents with a nonhealing ulcer on his right foot since January of this year History of Present Illness (HPI) 12/14/15; this is a 60 year old type II diabetic on insulin with diabetic polyneuropathy. He apparently had a callus at roughly his fourth metatarsal head in January he was attempting to remove however he ended up with a ulcer. His wife who was present said they have been using saline to clean this wound and using Polysporin and Betadine- but not making much progress. His previous surgery by his podiatrist Dr. Milinda Pointer in 2014 but does not wish to go back to see Dr. Milinda Pointer and was referred here instead. He has not been on any antibiotics and has not had recent x-rays of the foot. He has no known polyneuropathy and no prior wound history [facility] it appears that the patient had foot corrective surgery on 04/02/13 this consisted of an [facility] bunion repair on the right second metatarsal, osteotomy of the right fifth metatarsal, osteotomy of the right hammertoe repair on #2 #3 in #5. Sometime after this he injured the foot he was noted to have fractures with fracture of the first metatarsal osteotomy fracture of the second metatarsal osteotomy dislocation of the third metatarsal phalangeal joint and fracture of the fifth metatarsal osteotomy site here and I believe he went back to the operating room and had internal fixation of the right foot. He has not had any recent  x-rays of the right foot. He had an MRI of the right foot in 2014 last hemoglobin A1c I see is at 7.1 on 10/29/15. He had a normal serum albumin on 04/29/15 of 4.8. He does not have any nutritional issues 12/21/15; x-ray of the foot that showed no acute bony abnormality no radiopaque foreign bodies. Postsurgical changes in the right first metatarsal were stable healing of fractures in the distal second metatarsal, old healed right fifth metatarsal fracture cortical erosions noted in the right third and fourth metatarsals. We have been using Prisma 12/28/15 the area on his right foot at  roughly the fourth metatarsal head is unchanged. Still some circumferential callus and nonviable subcutaneous tissue which requires debridement. No obvious infection 01/04/16; wound is unchanged. Notable for less circumferential callus. Still non-viable subcutaneous tissue which requires debridement. No obvious infection. The patient did not come prepared for a total contact cast we will put this on next week 01/12/16; dimensions of the wound unchanged. The patient did not want a total contact cast today but is prepared to talk about this in 2-3 weeks if there is no progression towards resolution. 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 01/18/16; I don't see much change in this wound which is on the right fifth metatarsal head. I have not been Mccrory, Donnavin A. ([id number]) able to talk him into a total contact cast. He continues to offload this with the Darco forefoot offloading boot, scooter at work etc. Objective Constitutional Vitals Time Taken: 3:05 PM, Height: 72 in, Weight: 223 lbs, BMI: 30.2, Temperature: 98.2 F, Pulse: 88 bpm, Respiratory Rate: 16 breaths/min, Blood Pressure: 131/72 mmHg. Integumentary (Hair, Skin) Wound #1 status is Open. Original cause of wound was Gradually Appeared. The wound is located on the Plantar Foot. The wound measures 0.9cm length x 0.9cm  width x 0.2cm depth; 0.636cm^2 area and 0.127cm^3 volume. The wound is limited to skin breakdown. There is no tunneling or undermining noted. There is a medium amount of serosanguineous drainage noted. The wound margin is distinct with the outline attached to the wound base. There is large (67-100%) pink, pale granulation within the wound bed. There is no necrotic tissue within the wound bed. The periwound skin appearance exhibited: Callus, Maceration, Moist. The periwound skin appearance did not exhibit: Crepitus, Excoriation, Fluctuance, Friable, Induration, Localized Edema, Rash, Scarring, Dry/Scaly, Atrophie Blanche, Cyanosis, Ecchymosis, Hemosiderin Staining, Mottled, Pallor, Rubor, Erythema. Periwound temperature was noted as No Abnormality. The periwound has tenderness on palpation. Assessment Active Problems ICD-10 E11.621 - Type 2 diabetes mellitus with foot ulcer E11.42 - Type 2 diabetes mellitus with diabetic polyneuropathy L97.513 - Non-pressure chronic ulcer of other part of right foot with necrosis of muscle Procedures Wound #1 Wound #1 is a Diabetic Wound/Ulcer of the Lower Extremity located on the Plantar Foot . There was a Skin/Subcutaneous Tissue Debridement [id number]) debridement with total area of 0.81 sq cm Mazzola, Macrae A. ([id number]) performed by Ricard Dillon, MD. with the following instrument(s): Curette to remove Non-Viable tissue/material including Fibrin/Slough, Callus, and Subcutaneous after achieving pain control using Lidocaine 4% Topical Solution. A time out was conducted prior to the start of the procedure. A Minimum amount of bleeding was controlled with Pressure. The procedure was tolerated well with a pain level of 0 throughout and a pain level of 0 following the procedure. Post Debridement Measurements: 0.9cm length x 0.9cm width x 0.2cm depth; 0.127cm^3 volume. Post procedure Diagnosis Wound #1: Same as Pre-Procedure Plan Wound Cleansing: Wound  #1 Plantar Foot: Cleanse wound with mild soap and water May Shower, gently pat wound dry prior to applying new dressing. May shower with protection. Primary Wound Dressing: Wound #1 Plantar Foot: Aquacel Ag Secondary Dressing: Wound #1 Plantar Foot: Gauze and Kerlix/Conform Dressing Change Frequency: Wound #1 Plantar Foot: Change dressing every other day. Follow-up Appointments: Wound #1 Plantar Foot: Return Appointment in 2 weeks. Off-Loading: Wound #1 Plantar Foot: Open toe surgical shoe to: - Front offloader darco Other: - felt Additional Orders / Instructions: Wound #1 Plantar Foot: Increase protein intake. Activity as tolerated change to Aquacel  AG from RTD secondary to maceration. no overt infection Giesler, ZELMA FIENE ([id number]) he will not agree to [facility] Signature(s) Signed: 01/19/2016 12:38:30 PM By: Linton Ham MD Entered By: Linton Ham on 01/18/2016 15:33:28 Colligan, Gerda Diss ([id number]) -------------------------------------------------------------------------------- [place] Details Patient Name: Sivertson, Mose A. Date of Service: 01/18/2016 Medical Record Patient Account Number: [id number] Number: Treating RN: Baruch Gouty, RN, BSN, Rita Nov 27, 1955 [phone] y.o. Other Clinician: Date of Birth/Sex: Male) Treating Reagen Goates, [place] Primary Care Physician: Viviana Simpler Physician/Extender: G Referring Physician: Marily Memos in Treatment: 5 Diagnosis Coding ICD-10 Codes Code Description E11.621 Type 2 diabetes mellitus with foot ulcer E11.42 Type 2 diabetes mellitus with diabetic polyneuropathy L97.513 Non-pressure chronic ulcer of other part of right foot with necrosis of muscle Facility Procedures CPT4 Code: [id number] Description: [id number] - DEB SUBQ TISSUE 20 SQ CM/< ICD-10 Description Diagnosis E11.621 Type 2 diabetes mellitus with foot ulcer Modifier: Quantity: 1 Physician Procedures CPT4 Code: [id number] Description: [id number] - WC PHYS  SUBQ TISS 20 SQ CM ICD-10 Description Diagnosis E11.621 Type 2 diabetes mellitus with foot ulcer Modifier: Quantity: 1 Electronic Signature(s) Signed: 01/19/2016 12:38:30 PM By: Linton Ham MD Entered By: Linton Ham on 01/18/2016 15:34:08

## 2016-01-19 NOTE — Progress Notes (Signed)
Tyminski, HAVIER KELLAR ([id number]) Visit Report for 01/18/2016 Arrival Information Details Patient Name: [name], [name] Date of Service: 01/18/2016 3:00 PM Medical Record Number: [id number] Patient Account Number: [id number] Date of Birth/Sex: 1955-08-09 (60 y.o. Male) Treating RN: Afful, RN, BSN, Velva Harman Primary Care Physician: Viviana Simpler Other Clinician: Referring Physician: Viviana Simpler Treating Physician/Extender: Tito Dine in Treatment: 5 Visit Information History Since Last Visit Added or deleted any medications: No Patient Arrived: Ambulatory Any new allergies or adverse reactions: No Arrival Time: 15:00 Had a fall or experienced change in No Accompanied By: self activities of daily living that may affect Transfer Assistance: None risk of falls: Patient Identification Verified: Yes Signs or symptoms of abuse/neglect since last No Secondary Verification Process Yes visito Completed: Has Dressing in Place as Prescribed: Yes Patient Requires Transmission-Based No Pain Present Now: No Precautions: Patient Has Alerts: Yes Patient Alerts: HgbA1c 7.0 Electronic Signature(s) Signed: 01/18/2016 3:10:30 PM By: Regan Lemming BSN, RN Entered By: Regan Lemming on 01/18/2016 15:10:29 [name], [name] ([id number]) -------------------------------------------------------------------------------- Encounter Discharge Information Details Patient Name: [name], [name] Date of Service: 01/18/2016 3:00 PM Medical Record Number: [id number] Patient Account Number: [id number] Date of Birth/Sex: 02-15-1956 (60 y.o. Male) Treating RN: Afful, RN, BSN, Velva Harman Primary Care Physician: Viviana Simpler Other Clinician: Referring Physician: Viviana Simpler Treating Physician/Extender: Tito Dine in Treatment: 5 Encounter Discharge Information Items Schedule Follow-up Appointment: No Medication Reconciliation completed No and provided to Patient/Care Ricco Dershem: Provided  on Clinical Summary of Care: 01/18/2016 Form Type Recipient Paper Patient JW Electronic Signature(s) Signed: 01/18/2016 3:27:54 PM By: Ruthine Dose Entered By: Ruthine Dose on 01/18/2016 15:27:53 [name], [name] ([id number]) -------------------------------------------------------------------------------- Lower Extremity Assessment Details Patient Name: [name], [name] Date of Service: 01/18/2016 3:00 PM Medical Record Number: [id number] Patient Account Number: [id number] Date of Birth/Sex: 05-16-1956 (60 y.o. Male) Treating RN: Afful, RN, BSN, Velva Harman Primary Care Physician: Viviana Simpler Other Clinician: Referring Physician: Viviana Simpler Treating Physician/Extender: Tito Dine in Treatment: 5 Vascular Assessment Pulses: Posterior Tibial Dorsalis Pedis Palpable: [Right:Yes] Extremity colors, hair growth, and conditions: Extremity Color: [Right:Normal] Hair Growth on Extremity: [Right:Yes] Temperature of Extremity: [Right:Warm] Capillary Refill: [Right:< 3 seconds] Toe Nail Assessment Left: Right: Thick: No Discolored: No Deformed: No Improper Length and Hygiene: No Electronic Signature(s) Signed: 01/18/2016 3:11:00 PM By: Regan Lemming BSN, RN Entered By: Regan Lemming on 01/18/2016 15:11:00 [name], [name] ([id number]) -------------------------------------------------------------------------------- Multi Wound Chart Details Patient Name: [name], [name] Date of Service: 01/18/2016 3:00 PM Medical Record Number: [id number] Patient Account Number: [id number] Date of Birth/Sex: [birth year] (60 y.o. Male) Treating RN: Afful, RN, BSN, Velva Harman Primary Care Physician: Viviana Simpler Other Clinician: Referring Physician: Viviana Simpler Treating Physician/Extender: Tito Dine in Treatment: 5 Vital Signs Height(in): 72 Pulse(bpm): 88 Weight(lbs): 223 Blood Pressure 131/72 (mmHg): Body Mass Index(BMI): 30 Temperature(F): 98.2 Respiratory  Rate 16 (breaths/min): Photos: [1:No Photos] [N/A:N/A] Wound Location: [1:Foot - Plantar] [N/A:N/A] Wounding Event: [1:Gradually Appeared] [N/A:N/A] Primary Etiology: [1:Diabetic Wound/Ulcer of the Lower Extremity] [N/A:N/A] Comorbid History: [1:Hypertension, Type II Diabetes, Gout] [N/A:N/A] Date Acquired: [1:08/03/2015] [N/A:N/A] Weeks of Treatment: [1:5] [N/A:N/A] Wound Status: [1:Open] [N/A:N/A] Measurements L x W x D 0.9x0.9x0.2 [N/A:N/A] (cm) Area (cm) : [1:0.636] [N/A:N/A] Volume (cm) : [1:0.127] [N/A:N/A] % Reduction in Area: [1:61.40%] [N/A:N/A] % Reduction in Volume: 80.80% [N/A:N/A] Classification: [1:Grade 1] [N/A:N/A] Exudate Amount: [1:Medium] [N/A:N/A] Exudate Type: [1:Serosanguineous] [N/A:N/A] Exudate Color: [1:red, brown] [N/A:N/A] Wound Margin: [1:Distinct, outline attached] [N/A:N/A] Granulation Amount: [1:Large (67-100%)] [N/A:N/A] Granulation Quality: [1:Pink, Pale] [N/A:N/A]  Necrotic Amount: [1:None Present (0%)] [N/A:N/A] Exposed Structures: [1:Fascia: No Fat: No Tendon: No Muscle: No Joint: No Bone: No] [N/A:N/A] Limited to Skin Breakdown Epithelialization: None N/A N/A Periwound Skin Texture: Callus: Yes N/A N/A Edema: No Excoriation: No Induration: No Crepitus: No Fluctuance: No Friable: No Rash: No Scarring: No Periwound Skin Maceration: Yes N/A N/A Moisture: Moist: Yes Dry/Scaly: No Periwound Skin Color: Atrophie Blanche: No N/A N/A Cyanosis: No Ecchymosis: No Erythema: No Hemosiderin Staining: No Mottled: No Pallor: No Rubor: No Temperature: No Abnormality N/A N/A Tenderness on Yes N/A N/A Palpation: Wound Preparation: Ulcer Cleansing: N/A N/A Rinsed/Irrigated with Saline Topical Anesthetic Applied: Other: lidocaine 4% Treatment Notes Electronic Signature(s) Signed: 01/18/2016 4:15:47 PM By: Regan Lemming BSN, RN Entered By: Regan Lemming on 01/18/2016 15:14:45 [name], [name]  ([id number]) -------------------------------------------------------------------------------- Brent Details Patient Name: [name], [name] Date of Service: 01/18/2016 3:00 PM Medical Record Number: [id number] Patient Account Number: [id number] Date of Birth/Sex: 07-02-1956 (60 y.o. Male) Treating RN: Afful, RN, BSN, Velva Harman Primary Care Physician: Viviana Simpler Other Clinician: Referring Physician: Viviana Simpler Treating Physician/Extender: Tito Dine in Treatment: 5 Active Inactive Abuse / Safety / Falls / Self Care Management Nursing Diagnoses: Impaired home maintenance Impaired physical mobility Knowledge deficit related to abuse or neglect Knowledge deficit related to: safety; personal, health (wound), emergency Potential for falls Goals: Patient will remain injury free Date Initiated: 12/14/2015 Goal Status: Active Patient/caregiver will verbalize understanding of skin care regimen Date Initiated: 12/14/2015 Goal Status: Active Patient/caregiver will verbalize/demonstrate measure taken to improve self care Date Initiated: 12/14/2015 Goal Status: Active Patient/caregiver will verbalize/demonstrate measures taken to improve the patient's personal safety Date Initiated: 12/14/2015 Goal Status: Active Patient/caregiver will verbalize/demonstrate measures taken to prevent injury and/or falls Date Initiated: 12/14/2015 Goal Status: Active Patient/caregiver will verbalize/demonstrate understanding of what to do in case of emergency Date Initiated: 12/14/2015 Goal Status: Active Interventions: Assess fall risk on admission and as needed Assess: immobility, friction, shearing, incontinence upon admission and as needed Assess impairment of mobility on admission and as needed per policy Assess self care needs on admission and as needed Patient referred to community resources (specify in notes) [name], Bradley HOLD. ([id number]) Provide education on basic  hygiene Provide education on personal and home safety Provide education on safe transfers Provide education on vaccinations Treatment Activities: Education provided on Basic Hygiene : 01/12/2016 Notes: Orientation to the Wound Care Program Nursing Diagnoses: Knowledge deficit related to the wound healing center program Goals: Patient/caregiver will verbalize understanding of the [facility] Date Initiated: 12/14/2015 Goal Status: Active Interventions: Provide education on orientation to the wound center Notes: Peripheral Neuropathy Nursing Diagnoses: Knowledge deficit related to disease process and management of peripheral neurovascular dysfunction Potential alteration in peripheral tissue perfusion (select prior to confirmation of diagnosis) Goals: Patient/caregiver will verbalize understanding of disease process and disease management Date Initiated: 12/14/2015 Goal Status: Active Interventions: Assess signs and symptoms of neuropathy upon admission and as needed Provide education on Management of Neuropathy and Related Ulcers Provide education on Management of Neuropathy upon discharge from the [facility] Activities: Patient referred for customized footwear/offloading : 12/14/2015 Notes: Wound/Skin Impairment [name], [name] ([id number]) Nursing Diagnoses: Impaired tissue integrity Knowledge deficit related to ulceration/compromised skin integrity Goals: Patient/caregiver will verbalize understanding of skin care regimen Date Initiated: 12/14/2015 Goal Status: Active Ulcer/skin breakdown will have a volume reduction of 30% by week 4 Date Initiated: 12/14/2015 Goal Status: Active Ulcer/skin breakdown will have a volume reduction of 50% by  week 8 Date Initiated: 12/14/2015 Goal Status: Active Ulcer/skin breakdown will have a volume reduction of 80% by week 12 Date Initiated: 12/14/2015 Goal Status: Active Ulcer/skin breakdown will heal within 14  weeks Date Initiated: 12/14/2015 Goal Status: Active Interventions: Assess patient/caregiver ability to obtain necessary supplies Assess patient/caregiver ability to perform ulcer/skin care regimen upon admission and as needed Assess ulceration(s) every visit Provide education on ulcer and skin care Treatment Activities: Referred to DME Rayni Nemitz for dressing supplies : 12/14/2015 Skin care regimen initiated : 12/14/2015 Topical wound management initiated : 12/14/2015 Notes: Electronic Signature(s) Signed: 01/18/2016 3:12:26 PM By: Regan Lemming BSN, RN Entered By: Regan Lemming on 01/18/2016 15:12:25 [name], [name] ([id number]) -------------------------------------------------------------------------------- Pain Assessment Details Patient Name: [name], [name] Date of Service: 01/18/2016 3:00 PM Medical Record Number: [id number] Patient Account Number: [id number] Date of Birth/Sex: Aug 02, 1955 (60 y.o. Male) Treating RN: Afful, RN, BSN, Velva Harman Primary Care Physician: Viviana Simpler Other Clinician: Referring Physician: Viviana Simpler Treating Physician/Extender: Tito Dine in Treatment: 5 Active Problems Location of Pain Severity and Description of Pain Patient Has Paino No Site Locations With Dressing Change: No Pain Management and Medication Current Pain Management: Electronic Signature(s) Signed: 01/18/2016 3:10:39 PM By: Regan Lemming BSN, RN Entered By: Regan Lemming on 01/18/2016 15:10:39 [name], [name] ([id number]) -------------------------------------------------------------------------------- Wound Assessment Details Patient Name: [name], [name] Date of Service: 01/18/2016 3:00 PM Medical Record Number: [id number] Patient Account Number: [id number] Date of Birth/Sex: 11/23/1955 (60 y.o. Male) Treating RN: Afful, RN, BSN, Velva Harman Primary Care Physician: Viviana Simpler Other Clinician: Referring Physician: Viviana Simpler Treating Physician/Extender: Tito Dine in Treatment: 5 Wound Status Wound Number: 1 Primary Diabetic Wound/Ulcer of the Lower Etiology: Extremity Wound Location: Foot - Plantar Wound Status: Open Wounding Event: Gradually Appeared Comorbid Hypertension, Type II Diabetes, Date Acquired: 08/03/2015 History: Gout Weeks Of Treatment: 5 Clustered Wound: No Photos Photo Uploaded By: Regan Lemming on 01/18/2016 16:16:48 Wound Measurements Length: (cm) 0.9 Width: (cm) 0.9 Depth: (cm) 0.2 Area: (cm) 0.636 Volume: (cm) 0.127 % Reduction in Area: 61.4% % Reduction in Volume: 80.8% Epithelialization: None Tunneling: No Undermining: No Wound Description Classification: Grade 1 Wound Margin: Distinct, outline attached Exudate Amount: Medium Exudate Type: Serosanguineous Exudate Color: red, brown Foul Odor After Cleansing: No Wound Bed Granulation Amount: Large (67-100%) Exposed Structure Granulation Quality: Pink, Pale Fascia Exposed: No Necrotic Amount: None Present (0%) Fat Layer Exposed: No Tendon Exposed: No [name], [name] ([id number]) Muscle Exposed: No Joint Exposed: No Bone Exposed: No Limited to Skin Breakdown Periwound Skin Texture Texture Color No Abnormalities Noted: No No Abnormalities Noted: No Callus: Yes Atrophie Blanche: No Crepitus: No Cyanosis: No Excoriation: No Ecchymosis: No Fluctuance: No Erythema: No Friable: No Hemosiderin Staining: No Induration: No Mottled: No Localized Edema: No Pallor: No Rash: No Rubor: No Scarring: No Temperature / Pain Moisture Temperature: No Abnormality No Abnormalities Noted: No Tenderness on Palpation: Yes Dry / Scaly: No Maceration: Yes Moist: Yes Wound Preparation Ulcer Cleansing: Rinsed/Irrigated with Saline Topical Anesthetic Applied: Other: lidocaine 4%, Electronic Signature(s) Signed: 01/18/2016 4:15:47 PM By: Regan Lemming BSN, RN Entered By: Regan Lemming on 01/18/2016 15:11:57 Nanez, [name]  ([id number]) -------------------------------------------------------------------------------- Vitals Details Patient Name: Ledyard, Alessander A. Date of Service: 01/18/2016 3:00 PM Medical Record Number: [id number] Patient Account Number: [id number] Date of Birth/Sex: 1956-03-06 (60 y.o. Male) Treating RN: Baruch Gouty, RN, BSN, Velva Harman Primary Care Physician: Viviana Simpler Other Clinician: Referring Physician: Viviana Simpler Treating Physician/Extender: Tito Dine in Treatment: 5 Vital Signs Time  Taken: 15:05 Temperature (F): 98.2 Height (in): 72 Pulse (bpm): 88 Weight (lbs): 223 Respiratory Rate (breaths/min): 16 Body Mass Index (BMI): 30.2 Blood Pressure (mmHg): 131/72 Reference Range: 80 - 120 mg / dl Electronic Signature(s) Signed: 01/18/2016 4:15:47 PM By: Regan Lemming BSN, RN Entered By: Regan Lemming on 01/18/2016 15:05:29

## 2016-02-01 ENCOUNTER — Encounter: Payer: 59 | Attending: Nurse Practitioner | Admitting: Nurse Practitioner

## 2016-02-01 DIAGNOSIS — E11621 Type 2 diabetes mellitus with foot ulcer: Secondary | ICD-10-CM | POA: Diagnosis not present

## 2016-02-01 DIAGNOSIS — M109 Gout, unspecified: Secondary | ICD-10-CM | POA: Diagnosis not present

## 2016-02-01 DIAGNOSIS — L97513 Non-pressure chronic ulcer of other part of right foot with necrosis of muscle: Secondary | ICD-10-CM | POA: Diagnosis not present

## 2016-02-01 DIAGNOSIS — Z794 Long term (current) use of insulin: Secondary | ICD-10-CM | POA: Diagnosis not present

## 2016-02-01 DIAGNOSIS — I10 Essential (primary) hypertension: Secondary | ICD-10-CM | POA: Diagnosis not present

## 2016-02-01 DIAGNOSIS — E1142 Type 2 diabetes mellitus with diabetic polyneuropathy: Secondary | ICD-10-CM | POA: Insufficient documentation

## 2016-02-02 NOTE — Progress Notes (Signed)
[name] ([id number]) Visit Report for 02/01/2016 Chief Complaint Document Details Patient Name: [name], [name] Date of Service: 02/01/2016 2:15 PM Medical Record Number: [id number] Patient Account Number: [id number] Date of Birth/Sex: 01-01-1956 (60 y.o. Male) Treating RN: Macarthur Critchley Primary Care Physician: Viviana Simpler Other Clinician: Referring Physician: Viviana Simpler Treating Physician/Extender: Loistine Chance in Treatment: 7 Information Obtained from: Patient Chief Complaint 12/14/15; this is a 60 year old man who is a type II diabetic with diabetic neuropathy on insulin and metformin. Presents with a nonhealing ulcer on his right foot since January of this year Electronic Signature(s) Signed: 02/01/2016 5:44:16 PM By: Londell Moh FNP Entered By: Londell Moh on 02/01/2016 16:06:28 Kamara, Gerda Diss ([id number]) -------------------------------------------------------------------------------- HPI Details Patient Name: Wadel, [name] Date of Service: 02/01/2016 2:15 PM Medical Record Number: [id number] Patient Account Number: [id number] Date of Birth/Sex: 01/08/1956 (60 y.o. Male) Treating RN: Macarthur Critchley Primary Care Physician: Viviana Simpler Other Clinician: Referring Physician: Viviana Simpler Treating Physician/Extender: Loistine Chance in Treatment: 7 History of Present Illness HPI Description: 12/14/15; this is a 60 year old type II diabetic on insulin with diabetic polyneuropathy. He apparently had a callus at roughly his fourth metatarsal head in January he was attempting to remove however he ended up with a ulcer. His wife who was present said they have been using saline to clean this wound and using Polysporin and Betadine- but not making much progress. His previous surgery by his podiatrist Dr. Milinda Pointer in 2014 but does not wish to go back to see Dr. Milinda Pointer and was referred here instead. He has not been on any antibiotics and has  not had recent x-rays of the foot. He has no known polyneuropathy and no prior wound history [facility] appears that the patient had foot corrective surgery on 04/02/13 this consisted of an [facility] bunion repair on the right second metatarsal, osteotomy of the right fifth metatarsal, osteotomy of the right hammertoe repair on #2 #3 in #5. Sometime after this he injured the foot he was noted to have fractures with fracture of the first metatarsal osteotomy fracture of the second metatarsal osteotomy dislocation of the third metatarsal phalangeal joint and fracture of the fifth metatarsal osteotomy site here and I believe he went back to the operating room and had internal fixation of the right foot. He has not had any recent x-rays of the right foot. He had an MRI of the right foot in 2014 last hemoglobin A1c I see is at 7.1 on 10/29/15. He had a normal serum albumin on 04/29/15 of 4.8. He does not have any nutritional issues 12/21/15; x-ray of the foot that showed no acute bony abnormality no radiopaque foreign bodies. Postsurgical changes in the right first metatarsal were stable healing of fractures in the distal second metatarsal, old healed right fifth metatarsal fracture cortical erosions noted in the right third and fourth metatarsals. We have been using Prisma 12/28/15 the area on his right foot at roughly the fourth metatarsal head is unchanged. Still some circumferential callus and nonviable subcutaneous tissue which requires debridement. No obvious infection 01/04/16; wound is unchanged. Notable for less circumferential callus. Still non-viable subcutaneous tissue which requires debridement. No obvious infection. The patient did not come prepared for a total contact cast we will put this on next week 01/12/16; dimensions of the wound unchanged. The patient did not want a total contact cast today but is prepared to talk about this in 2-3 weeks if there is no  progression towards  resolution. 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 01/18/16; I don't see much change in this wound which is on the right fifth metatarsal head. I have not been able to talk him into a total contact cast. He continues to offload this with the Darco forefoot offloading boot, scooter at work etc. 02/01/16:no changes. no significant improvement with wound. no reported systemic s/s of infection. Electronic Signature(s) Signed: 02/01/2016 5:44:16 PM By: Londell Moh FNP Entered By: Londell Moh on 02/01/2016 16:07:27 Zilberman, Gerda Diss ([id number]) Karpel, Gerda Diss ([id number]) -------------------------------------------------------------------------------- Physical Exam Details Patient Name: [name], [name] Date of Service: 02/01/2016 2:15 PM Medical Record Number: [id number] Patient Account Number: [id number] Date of Birth/Sex: [birth year] (60 y.o. Male) Treating RN: Macarthur Critchley Primary Care Physician: Viviana Simpler Other Clinician: Referring Physician: Viviana Simpler Treating Physician/Extender: Loistine Chance in Treatment: 7 Constitutional Patient's appearance is neat and clean. Appears in no acute distress. Well nourished and well developed.. Eyes Conjunctivae clear. No discharge.. Ears, Nose, Mouth, and Throat Patient can hear normal speaking tones without difficulty.Marland Kitchen Respiratory Respiratory effort is easy and symmetric bilaterally. Rate is normal at rest and on room air.. Integumentary (Hair, Skin) DFU to right plantar 5th metatarsal head. no s/s of infection.Marland Kitchen Psychiatric Alert and oriented times 3.. No evidence of depression, anxiety, or agitation. Calm, cooperative, and communicative. Appropriate interactions and affect.. Electronic Signature(s) Signed: 02/01/2016 5:44:16 PM By: Londell Moh FNP Entered By: Londell Moh on 02/01/2016 16:08:41 Barret, Gerda Diss  ([id number]) -------------------------------------------------------------------------------- Physician Orders Details Patient Name: [name], [name] Date of Service: 02/01/2016 2:15 PM Medical Record Number: [id number] Patient Account Number: [id number] Date of Birth/Sex: [birth year] (60 y.o. Male) Treating RN: Macarthur Critchley Primary Care Physician: Viviana Simpler Other Clinician: Referring Physician: Viviana Simpler Treating Physician/Extender: Loistine Chance in Treatment: 7 Verbal / Phone Orders: Yes Clinician: Macarthur Critchley Read Back and Verified: Yes Diagnosis Coding Wound Cleansing Wound #1 Plantar Foot o Cleanse wound with mild soap and water o May Shower, gently pat wound dry prior to applying new dressing. o May shower with protection. Primary Wound Dressing Wound #1 Plantar Foot o Aquacel Ag Secondary Dressing Wound #1 Plantar Foot o Gauze and Kerlix/Conform Dressing Change Frequency Wound #1 Plantar Foot o Change dressing every other day. Follow-up Appointments Wound #1 Plantar Foot o Return Appointment in 2 weeks. 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 o Activity as tolerated Electronic Signature(s) Signed: 02/01/2016 4:21:40 PM By: Rebecca Eaton RN, [address] [place] ([id number]) Signed: 02/01/2016 5:44:16 PM By: Londell Moh FNP Entered By: Rebecca Eaton RN, [place] on 02/01/2016 14:31:51 Strehlow, Gerda Diss ([id number]) -------------------------------------------------------------------------------- Problem List Details Patient Name: Staub, Benuel A. Date of Service: 02/01/2016 2:15 PM Medical Record Number: [id number] Patient Account Number: [id number] Date of Birth/Sex: 10-24-1955 (60 y.o. 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 Date of Service: 02/01/2016 2:15 PM Medical Record Number: [id number] Patient Account Number: [id number] Date of Birth/Sex: 1956-07-07 (60 y.o. Male) Treating RN: Macarthur Critchley Primary Care Physician: Viviana Simpler Other Clinician: Referring Physician: Viviana Simpler Treating Physician/Extender: Loistine Chance in Treatment: 7 Subjective Chief Complaint Information obtained from Patient 12/14/15; this is a 60 year old man who is a type II diabetic with diabetic neuropathy on insulin and metformin. Presents with a nonhealing ulcer on his right foot since January of this year History of Present Illness (HPI) 12/14/15; this is a 60 year old type II diabetic on insulin with diabetic polyneuropathy. He apparently had a callus at roughly his fourth metatarsal head in January he was attempting to remove however he ended up with a ulcer. His wife who was present said they have been using saline to clean this wound and using Polysporin and Betadine- but not making much progress. His previous surgery by his podiatrist Dr. Milinda Pointer in 2014 but does not wish to go back to see Dr. Milinda Pointer and was referred here instead. He has not been on  any antibiotics and has not had recent x-rays of the foot. He has no known polyneuropathy and no prior wound history [facility] it appears that the patient had foot corrective surgery on 04/02/13 this consisted of an [facility] bunion repair on the right second metatarsal, osteotomy of the right fifth metatarsal, osteotomy of the right hammertoe repair on #2 #3 in #5. Sometime after this he injured the foot he was noted to have fractures with fracture of the first metatarsal osteotomy fracture of the second metatarsal osteotomy dislocation of the third metatarsal phalangeal joint and fracture of the fifth metatarsal osteotomy site here and I believe he went back to the operating room and had internal fixation of the right foot. He has not had any recent x-rays of the right foot. He had an MRI of the right foot in 2014 last hemoglobin A1c I see is at 7.1 on 10/29/15. He had a normal serum albumin on 04/29/15 of 4.8. He does not have any nutritional issues 12/21/15; x-ray of the foot that showed no acute bony abnormality no radiopaque foreign bodies. Postsurgical changes in the right first metatarsal were stable healing of fractures in the distal second metatarsal, old healed right fifth metatarsal fracture cortical erosions noted in the right third and fourth metatarsals. We have been using Prisma 12/28/15 the area on his right foot at roughly the fourth metatarsal head is unchanged. Still some circumferential callus and nonviable subcutaneous tissue which requires debridement. No obvious infection 01/04/16; wound is unchanged. Notable for less circumferential callus. Still non-viable subcutaneous tissue which requires debridement. No obvious infection. The patient did not come prepared for a total contact cast we will put this on next week 01/12/16; dimensions of the wound unchanged. The patient did not want a total contact cast today but is prepared to talk about this in 2-3 weeks  if there is no progression towards resolution. 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 01/18/16; I don't see much change in this wound which is on the right fifth metatarsal head. I have not been able to talk him into a total contact cast. He continues to offload this with the Darco forefoot offloading boot, scooter at work etc. [place], [place] A. ([id number]) 02/01/16:no changes. no significant improvement with wound. no reported systemic s/s of infection. Objective Constitutional Patient's appearance is  neat and clean. Appears in no acute distress. Well nourished and well developed.. Vitals Time Taken: 2:20 PM, Height: 72 in, Weight: 223 lbs, BMI: 30.2, Temperature: 98.1 F, Pulse: 84 bpm, Blood Pressure: 144/84 mmHg. Eyes Conjunctivae clear. No discharge.. Ears, Nose, Mouth, and Throat Patient can hear normal speaking tones without difficulty.Marland Kitchen Respiratory Respiratory effort is easy and symmetric bilaterally. Rate is normal at rest and on room air.Marland Kitchen Psychiatric Alert and oriented times 3.. No evidence of depression, anxiety, or agitation. Calm, cooperative, and communicative. Appropriate interactions and affect.. Integumentary (Hair, Skin) DFU to right plantar 5th metatarsal head. no s/s of infection.. Wound #1 status is Open. Original cause of wound was Gradually Appeared. The wound is located on the Plantar Foot. The wound measures 0.8cm length x 0.6cm width x 0.2cm depth; 0.377cm^2 area and 0.075cm^3 volume. The wound is limited to skin breakdown. There is no tunneling or undermining noted. There is a small amount of serosanguineous drainage noted. The wound margin is distinct with the outline attached to the wound base. There is large (67-100%) pink, pale granulation within the wound bed. There is no necrotic tissue within the wound bed. The periwound skin appearance exhibited: Callus. The periwound skin appearance did not exhibit: Crepitus,  Excoriation, Fluctuance, Friable, Induration, Localized Edema, Rash, Scarring, Dry/Scaly, Maceration, Moist, Atrophie Blanche, Cyanosis, Ecchymosis, Hemosiderin Staining, Mottled, Pallor, Rubor, Erythema. Periwound temperature was noted as No Abnormality. Assessment Nicotra, CONOR ANGIOLILLO ([id number]) Active Problems ICD-10 E11.621 - Type 2 diabetes mellitus with foot ulcer E11.42 - Type 2 diabetes mellitus with diabetic polyneuropathy L97.513 - Non-pressure chronic ulcer of other part of right foot with necrosis of muscle Diagnoses ICD-10 E11.621: Type 2 diabetes mellitus with foot ulcer E11.42: Type 2 diabetes mellitus with diabetic polyneuropathy L97.513: Non-pressure chronic ulcer of other part of right foot with necrosis of muscle Plan Wound Cleansing: Wound #1 Plantar Foot: Cleanse wound with mild soap and water May Shower, gently pat wound dry prior to applying new dressing. May shower with protection. Primary Wound Dressing: Wound #1 Plantar Foot: Aquacel Ag Secondary Dressing: Wound #1 Plantar Foot: Gauze and Kerlix/Conform Dressing Change Frequency: Wound #1 Plantar Foot: Change dressing every other day. Follow-up Appointments: Wound #1 Plantar Foot: Return Appointment in 2 weeks. Off-Loading: Wound #1 Plantar Foot: Open toe surgical shoe to: - Front offloader darco Other: - felt Additional Orders / Instructions: Wound #1 Plantar Foot: Increase protein intake. Activity as tolerated Follow-Up Appointments: [place] ([id number]) A follow-up appointment should be scheduled. Medication Reconciliation completed and provided to Patient/Care Provider. A Patient Clinical Summary of Care was provided to [place] Signature(s) Signed: 02/01/2016 5:44:16 PM By: Londell Moh FNP Entered By: Londell Moh on 02/01/2016 [place], Gerda Diss ([id number]) -------------------------------------------------------------------------------- [place] Details Patient  Name: Saperstein, Alwaleed A. Date of Service: 02/01/2016 Medical Record Number: [id number] Patient Account Number: [id number] Date of Birth/Sex: [birth year] (60 y.o. Male) Treating RN: Macarthur Critchley Primary Care Physician: Viviana Simpler Other Clinician: Referring Physician: Viviana Simpler Treating Physician/Extender: Loistine Chance in Treatment: 7 Diagnosis Coding ICD-10 Codes Code Description E11.621 Type 2 diabetes mellitus with foot ulcer E11.42 Type 2 diabetes mellitus with diabetic polyneuropathy L97.513 Non-pressure chronic ulcer of other part of right foot with necrosis of muscle Facility Procedures CPT4 Code: [id number] Description: 99213 - WOUND CARE VISIT-LEV 3 EST PT Modifier: Quantity: 1 Physician Procedures CPT4: Description Modifier Quantity Code [id number] - WC PHYS LEVEL 3 - EST PT 1 ICD-10 Description Diagnosis E11.621 Type 2 diabetes mellitus with foot ulcer E11.42  Type 2 diabetes mellitus with diabetic polyneuropathy L97.513 Non-pressure chronic  ulcer of other part of right foot with necrosis of muscle Electronic Signature(s) Signed: 02/01/2016 5:44:16 PM By: Londell Moh FNP Entered By: Londell Moh on 02/01/2016 16:09:40

## 2016-02-02 NOTE — Progress Notes (Signed)
Bradley [name], Bradley [name] ([id number]) Visit Report for 02/01/2016 Arrival Information Details Patient Name: Bradley [name], Bradley [name]. Date of Service: 02/01/2016 2:15 PM Medical Record Number: [id number] Patient Account Number: [id number] Date of Birth/Sex: 08/28/1955 (60 y.o. Male) Treating RN: Bradley [name] Primary Care Physician: Bradley [name] Other Clinician: Referring Physician: Viviana [name] Treating Physician/Extender: Bradley [name] in Treatment: 7 Visit Information History Since Last Visit All ordered tests and consults were completed: No Patient Arrived: Ambulatory Added or deleted any medications: No Arrival Time: 14:18 Any new allergies or adverse reactions: No Accompanied By: self Had [name] fall or experienced change in No Transfer Assistance: None activities of daily living that may affect Patient Requires Transmission-Based No risk of falls: Precautions: Signs or symptoms of abuse/neglect since last No Patient Has Alerts: Yes visito Patient Alerts: HgbA1c Hospitalized since last visit: No 7.0 Has Dressing in Place as Prescribed: Yes Pain Present Now: No Electronic Signature(s) Signed: 02/01/2016 4:21:40 PM By: Rebecca Eaton, RN, Sendra Entered By: Rebecca Eaton RN, Sendra on 02/01/2016 14:18:55 [name], Bradley [name] ([id number]) -------------------------------------------------------------------------------- Clinic Level of Care Assessment Details Patient Name: Bradley [name], Bradley [name]. Date of Service: 02/01/2016 2:15 PM Medical Record Number: [id number] Patient Account Number: [id number] Date of Birth/Sex: Nov 08, 1955 (60 y.o. Male) Treating RN: Bradley [name] Primary Care Physician: Bradley [name] Other Clinician: Referring Physician: Viviana [name] Treating Physician/Extender: Bradley [name] in Treatment: 7 Clinic Level of Care Assessment Items TOOL 4 Quantity Score X - Use when only an EandM is performed on FOLLOW-UP visit 1 0 ASSESSMENTS - Nursing Assessment /  Reassessment X - Reassessment of Co-morbidities (includes updates in patient status) 1 10 X - Reassessment of Adherence to Treatment Plan 1 5 ASSESSMENTS - Wound and Skin Assessment / Reassessment X - Simple Wound Assessment / Reassessment - one wound 1 5 []  - Complex Wound Assessment / Reassessment - multiple wounds 0 []  - Dermatologic / Skin Assessment (not related to wound area) 0 ASSESSMENTS - Focused Assessment []  - Circumferential Edema Measurements - multi extremities 0 []  - Nutritional Assessment / Counseling / Intervention 0 X - Lower Extremity Assessment (monofilament, tuning fork, pulses) 1 5 []  - Peripheral Arterial Disease Assessment (using hand held doppler) 0 ASSESSMENTS - Ostomy and/or Continence Assessment and Care []  - Incontinence Assessment and Management 0 []  - Ostomy Care Assessment and Management (repouching, etc.) 0 PROCESS - Coordination of Care X - Simple Patient / Family Education for ongoing care 1 15 []  - Complex (extensive) Patient / Family Education for ongoing care 0 X - Staff obtains Programmer, systems, Records, Test Results / Process Orders 1 10 []  - Staff telephones HHA, Nursing Homes / Clarify orders / etc 0 []  - Routine Transfer to another Facility (non-emergent condition) 0 [name], Muaz [name]. ([id number]) []  - Routine Hospital Admission (non-emergent condition) 0 []  - New Admissions / Biomedical engineer / Ordering NPWT, Apligraf, etc. 0 []  - Emergency Hospital Admission (emergent condition) 0 X - Simple Discharge Coordination 1 10 []  - Complex (extensive) Discharge Coordination 0 PROCESS - Special Needs []  - Pediatric / Minor Patient Management 0 []  - Isolation Patient Management 0 []  - Hearing / Language / Visual special needs 0 []  - Assessment of Community assistance (transportation, D/C planning, etc.) 0 []  - Additional assistance / Altered mentation 0 []  - Support Surface(s) Assessment (bed, cushion, seat, etc.) 0 INTERVENTIONS - Wound Cleansing /  Measurement X - Simple Wound Cleansing - one wound 1 5 []  - Complex Wound Cleansing - multiple wounds 0 X - Wound Imaging (photographs - any  number of wounds) 1 5 []  - Wound Tracing (instead of photographs) 0 X - Simple Wound Measurement - one wound 1 5 []  - Complex Wound Measurement - multiple wounds 0 INTERVENTIONS - Wound Dressings X - Small Wound Dressing one or multiple wounds 1 10 []  - Medium Wound Dressing one or multiple wounds 0 []  - Large Wound Dressing one or multiple wounds 0 X - Application of Medications - topical 1 5 []  - Application of Medications - injection 0 INTERVENTIONS - Miscellaneous []  - External ear exam 0 [name], Daton [name]. ([id number]) []  - Specimen Collection (cultures, biopsies, blood, body fluids, etc.) 0 []  - Specimen(s) / Culture(s) sent or taken to Lab for analysis 0 []  - Patient Transfer (multiple staff / Harrel Lemon Lift / Similar devices) 0 []  - Simple Staple / Suture removal (25 or less) 0 []  - Complex Staple / Suture removal (26 or more) 0 []  - Hypo / Hyperglycemic Management (close monitor of Blood Glucose) 0 []  - Ankle / Brachial Index (ABI) - do not check if billed separately 0 X - Vital Signs 1 5 Has the patient been seen at the hospital within the last three years: Yes Total Score: 95 Level Of Care: New/Established - Level 3 Electronic Signature(s) Signed: 02/01/2016 4:21:40 PM By: Rebecca Eaton, RN, Sendra Entered By: Rebecca Eaton RN, Sendra on 02/01/2016 14:47:16 [name], Bradley [name] ([id number]) -------------------------------------------------------------------------------- Encounter Discharge Information Details Patient Name: Bradley [name], Bradley [name]. Date of Service: 02/01/2016 2:15 PM Medical Record Number: [id number] Patient Account Number: [id number] Date of Birth/Sex: 08-25-1955 (60 y.o. Male) Treating RN: Bradley [name] Primary Care Physician: Bradley [name] Other Clinician: Referring Physician: Viviana [name] Treating Physician/Extender: Bradley [name] in Treatment: 7 Encounter Discharge Information Items Discharge Pain Level: 0 Discharge Condition: Stable Ambulatory Status: Ambulatory Discharge Destination: Home Transportation: Private Auto Schedule Follow-up Appointment: Yes Medication Reconciliation completed and provided to Patient/Care Yes [place] Nav[place]: Provided on Clinical Summary of Care: 02/01/2016 Form Type Recipient Paper Patient JW Electronic Signature(s) Signed: 02/01/2016 4:21:40 PM By: Rebecca Eaton RN, Sendra Previous Signature: 02/01/2016 2:39:07 PM Version By: Ruthine Dose Entered By: Rebecca Eaton RN, Sendra on 02/01/2016 14:46:27 Osborn, Bradley [name] ([id number]) -------------------------------------------------------------------------------- Lower Extremity Assessment Details Patient Name: Bradley [name], Bradley [name]. Date of Service: 02/01/2016 2:15 PM Medical Record Number: [id number] Patient Account Number: [id number] Date of Birth/Sex: Jan 13, 1956 (60 y.o. Male) Treating RN: Bradley [name] Primary Care Physician: Bradley [name] Other Clinician: Referring Physician: Viviana [name] Treating Physician/Extender: Bradley [name] in Treatment: 7 Edema Assessment Assessed: Shirlyn Goltz: No] [Right: No] Edema: [Left: N] [Right: o] Vascular Assessment Pulses: Posterior Tibial Dorsalis Pedis Palpable: [Right:Yes] Extremity colors, hair growth, and conditions: Extremity Color: [Right:Normal] Hair Growth on Extremity: [Right:Yes] Temperature of Extremity: [Right:Warm] Capillary Refill: [Right:< 3 seconds] Toe Nail Assessment Left: Right: Thick: No Discolored: No Deformed: No Improper Length and Hygiene: No Electronic Signature(s) Signed: 02/01/2016 4:21:40 PM By: Rebecca Eaton, RN, Sendra Entered By: Rebecca Eaton RN, Sendra on 02/01/2016 14:23:55 Cokley, Bradley [name] ([id number]) -------------------------------------------------------------------------------- Pain Assessment Details Patient Name: Bradley [name], Bradley [name]. Date of  Service: 02/01/2016 2:15 PM Medical Record Number: [id number] Patient Account Number: [id number] Date of Birth/Sex: [birth year] (60 y.o. Male) Treating RN: Bradley [name] Primary Care Physician: Bradley [name] Other Clinician: Referring Physician: Viviana [name] Treating Physician/Extender: Bradley [name] in Treatment: 7 Active Problems Location of Pain Severity and Description of Pain Patient Has Paino No Site Locations Pain Management and Medication Current Pain Management: Electronic Signature(s) Signed: 02/01/2016 4:21:40 PM By: Rebecca Eaton RN, Sendra Entered By: Rebecca Eaton RN, Sendra on 02/01/2016 14:19:01 Bradley [name], Bradley Rinks  [name]. ([id number]) -------------------------------------------------------------------------------- Patient/Caregiver Education Details Patient Name: Bradley [name], Bradley [name]. Date of Service: 02/01/2016 2:15 PM Medical Record Number: [id number] Patient Account Number: [id number] Date of Birth/Gender: [birth year] (60 y.o. Male) Treating RN: Bradley [name] Primary Care Physician: Bradley [name] Other Clinician: Referring Physician: Viviana [name] Treating Physician/Extender: Bradley [name] in Treatment: 7 Education Assessment Education Provided To: Patient Education Topics Provided Wound/Skin Impairment: Handouts: Caring for Your Ulcer, Skin Care Do's and Dont's Methods: Explain/Verbal Responses: State content correctly Electronic Signature(s) Signed: 02/01/2016 4:21:40 PM By: Rebecca Eaton, RN, Sendra Entered By: Rebecca Eaton RN, Sendra on 02/01/2016 14:46:39 Bradley [name], Bradley [name] ([id number]) -------------------------------------------------------------------------------- Wound Assessment Details Patient Name: Bradley [name], Bradley [name]. Date of Service: 02/01/2016 2:15 PM Medical Record Number: [id number] Patient Account Number: [id number] Date of Birth/Sex: 1956-02-27 (60 y.o. Male) Treating RN: Bradley [name] Primary Care Physician: Bradley [name] Other  Clinician: Referring Physician: Viviana [name] Treating Physician/Extender: Bradley [name] in Treatment: 7 Wound Status Wound Number: 1 Primary Diabetic Wound/Ulcer of the Lower Etiology: Extremity Wound Location: Foot - Plantar Wound Status: Open Wounding Event: Gradually Appeared Comorbid Hypertension, Type II Diabetes, Date Acquired: 08/03/2015 History: Gout Weeks Of Treatment: 7 Clustered Wound: No Photos Photo Uploaded By: Rebecca Eaton, RN, Roslynn Amble on 02/01/2016 14:48:12 Wound Measurements Length: (cm) 0.8 Width: (cm) 0.6 Depth: (cm) 0.2 Area: (cm) 0.377 Volume: (cm) 0.075 % Reduction in Area: 77.1% % Reduction in Volume: 88.6% Epithelialization: None Tunneling: No Undermining: No Wound Description Classification: Grade 1 Wound Margin: Distinct, outline attached Exudate Amount: Small Exudate Type: Serosanguineous Exudate Color: red, brown Foul Odor After Cleansing: No Wound Bed Granulation Amount: Large (67-100%) Exposed Structure Granulation Quality: Pink, Pale Fascia Exposed: No Necrotic Amount: None Present (0%) Fat Layer Exposed: No Tendon Exposed: No Bradley [name], Bradley [name]. ([id number]) Muscle Exposed: No Joint Exposed: No Bone Exposed: No Limited to Skin Breakdown Periwound Skin Texture Texture Color No Abnormalities Noted: No No Abnormalities Noted: No Callus: Yes Atrophie Blanche: No Crepitus: No Cyanosis: No Excoriation: No Ecchymosis: No Fluctuance: No Erythema: No Friable: No Hemosiderin Staining: No Induration: No Mottled: No Localized Edema: No Pallor: No Rash: No Rubor: No Scarring: No Temperature / Pain Moisture Temperature: No Abnormality No Abnormalities Noted: No Dry / Scaly: No Maceration: No Moist: No Wound Preparation Ulcer Cleansing: Rinsed/Irrigated with Saline Topical Anesthetic Applied: Other: lidocaine 4%, Treatment Notes Wound #1 (Plantar Foot) 1. Cleansed with: Clean wound with Normal Saline 2.  Anesthetic Topical Lidocaine 4% cream to wound bed prior to debridement 4. Dressing Applied: Aquacel Ag 5. Secondary Dressing Applied Gauze and Kerlix/Conform Notes felt/front offloader Electronic Signature(s) Signed: 02/01/2016 4:21:40 PM By: Rebecca Eaton, RN, Sendra Entered By: Rebecca Eaton RN, Sendra on 02/01/2016 14:23:35 Bradley [name], Bradley [name] ([id number]) -------------------------------------------------------------------------------- [place] Details Patient Name: Bradley [name], Bradley [name]. Date of Service: 02/01/2016 2:15 PM Medical Record Number: [id number] Patient Account Number: [id number] Date of Birth/Sex: 12/06/1955 (60 y.o. Male) Treating RN: Bradley [name] Primary Care Physician: Bradley [name] Other Clinician: Referring Physician: Viviana [name] Treating Physician/Extender: Bradley [name] in Treatment: 7 Vital Signs Time Taken: 14:20 Temperature (F): 98.1 Height (in): 72 Pulse (bpm): 84 Weight (lbs): 223 Blood Pressure (mmHg): 144/84 Body Mass Index (BMI): 30.2 Reference Range: 80 - 120 mg / dl Electronic Signature(s) Signed: 02/01/2016 4:21:40 PM By: Rebecca Eaton RN, Sendra Entered By: Rebecca Eaton RN, Sendra on 02/01/2016 14:20:12

## 2016-02-07 ENCOUNTER — Other Ambulatory Visit: Payer: Self-pay | Admitting: Internal Medicine

## 2016-02-15 ENCOUNTER — Ambulatory Visit: Payer: 59 | Admitting: Internal Medicine

## 2016-02-22 ENCOUNTER — Encounter: Payer: 59 | Attending: Internal Medicine | Admitting: Internal Medicine

## 2016-02-22 DIAGNOSIS — I10 Essential (primary) hypertension: Secondary | ICD-10-CM | POA: Insufficient documentation

## 2016-02-22 DIAGNOSIS — M109 Gout, unspecified: Secondary | ICD-10-CM | POA: Diagnosis not present

## 2016-02-22 DIAGNOSIS — Z794 Long term (current) use of insulin: Secondary | ICD-10-CM | POA: Insufficient documentation

## 2016-02-22 DIAGNOSIS — E11621 Type 2 diabetes mellitus with foot ulcer: Secondary | ICD-10-CM | POA: Insufficient documentation

## 2016-02-22 DIAGNOSIS — E1142 Type 2 diabetes mellitus with diabetic polyneuropathy: Secondary | ICD-10-CM | POA: Insufficient documentation

## 2016-02-22 DIAGNOSIS — L97513 Non-pressure chronic ulcer of other part of right foot with necrosis of muscle: Secondary | ICD-10-CM | POA: Insufficient documentation

## 2016-02-22 NOTE — Progress Notes (Addendum)
[name] ([id number]) Visit Report for 02/22/2016 Arrival Information Details Patient Name: [name], [name] Date of Service: 02/22/2016 3:00 PM Medical Record Number: [id number] Patient Account Number: [id number] Date of Birth/Sex: 11-09-1955 (60 y.o. Male) Treating RN: Afful, RN, BSN, Velva Harman Primary Care Physician: Viviana Simpler Other Clinician: Referring Physician: Viviana Simpler Treating Physician/Extender: Tito Dine in Treatment: 10 Visit Information History Since Last Visit All ordered tests and consults were completed: No Patient Arrived: Ambulatory Added or deleted any medications: No Arrival Time: 15:13 Any new allergies or adverse reactions: No Accompanied By: self Had a fall or experienced change in No Transfer Assistance: None activities of daily living that may affect Patient Identification Verified: Yes risk of falls: Secondary Verification Process Yes Signs or symptoms of abuse/neglect since last No Completed: visito Patient Requires Transmission-Based No Hospitalized since last visit: No Precautions: Has Dressing in Place as Prescribed: Yes Patient Has Alerts: Yes Pain Present Now: No Patient Alerts: HgbA1c 7.0 Electronic Signature(s) Signed: 02/22/2016 4:03:28 PM By: Regan Lemming BSN, RN Entered By: Regan Lemming on 02/22/2016 15:14:09 Spegal, Gerda Diss ([id number]) -------------------------------------------------------------------------------- Encounter Discharge Information Details Patient Name: Overacker, Justus A. Date of Service: 02/22/2016 3:00 PM Medical Record Number: [id number] Patient Account Number: [id number] Date of Birth/Sex: Dec 17, 1955 (60 y.o. Male) Treating RN: Afful, RN, BSN, Velva Harman Primary Care Physician: Viviana Simpler Other Clinician: Referring Physician: Viviana Simpler Treating Physician/Extender: Tito Dine in Treatment: 10 Encounter Discharge Information Items Discharge Pain Level: 0 Discharge Condition:  Stable Ambulatory Status: Ambulatory Discharge Destination: Home Transportation: Private Auto Accompanied By: self Schedule Follow-up Appointment: No Medication Reconciliation completed and provided to Patient/Care No Emberleigh Reily: Provided on Clinical Summary of Care: 02/22/2016 Form Type Recipient Paper Patient JW Electronic Signature(s) Signed: 02/22/2016 4:03:28 PM By: Regan Lemming BSN, RN Previous Signature: 02/22/2016 3:40:17 PM Version By: Ruthine Dose Entered By: Regan Lemming on 02/22/2016 15:40:51 Hannibal, Gerda Diss ([id number]) -------------------------------------------------------------------------------- Lower Extremity Assessment Details Patient Name: Waldroup, Leonidas A. Date of Service: 02/22/2016 3:00 PM Medical Record Number: [id number] Patient Account Number: [id number] Date of Birth/Sex: 28-Jan-1956 (60 y.o. Male) Treating RN: Afful, RN, BSN, Velva Harman Primary Care Physician: Viviana Simpler Other Clinician: Referring Physician: Viviana Simpler Treating Physician/Extender: Tito Dine in Treatment: 10 Vascular Assessment Pulses: Posterior Tibial Dorsalis Pedis Palpable: [Right:Yes] Extremity colors, hair growth, and conditions: Extremity Color: [Right:Normal] Hair Growth on Extremity: [Right:No] Temperature of Extremity: [Right:Warm] Capillary Refill: [Right:< 3 seconds] Toe Nail Assessment Left: Right: Thick: No Discolored: No Deformed: No Improper Length and Hygiene: No Electronic Signature(s) Signed: 02/22/2016 4:03:28 PM By: Regan Lemming BSN, RN Entered By: Regan Lemming on 02/22/2016 15:15:17 Kinn, Gerda Diss ([id number]) -------------------------------------------------------------------------------- Multi Wound Chart Details Patient Name: Oesterle, Dontrelle A. Date of Service: 02/22/2016 3:00 PM Medical Record Number: [id number] Patient Account Number: [id number] Date of Birth/Sex: April 20, 1956 (60 y.o. Male) Treating RN: Afful, RN, BSN, Velva Harman Primary Care Physician:  Viviana Simpler Other Clinician: Referring Physician: Viviana Simpler Treating Physician/Extender: Tito Dine in Treatment: 10 Vital Signs Height(in): 72 Pulse(bpm): 87 Weight(lbs): 223 Blood Pressure 150/74 (mmHg): Body Mass Index(BMI): 30 Temperature(F): 98.1 Respiratory Rate 16 (breaths/min): Photos: [1:No Photos] [N/A:N/A] Wound Location: [1:Foot - Plantar] [N/A:N/A] Wounding Event: [1:Gradually Appeared] [N/A:N/A] Primary Etiology: [1:Diabetic Wound/Ulcer of the Lower Extremity] [N/A:N/A] Comorbid History: [1:Hypertension, Type II Diabetes, Gout] [N/A:N/A] Date Acquired: [1:08/03/2015] [N/A:N/A] Weeks of Treatment: [1:10] [N/A:N/A] Wound Status: [1:Open] [N/A:N/A] Measurements L x W x D 0.7x0.7x0.4 [N/A:N/A] (cm) Area (cm) : [1:0.385] [N/A:N/A] Volume (cm) : [1:0.154] [N/A:N/A] % Reduction  in Area: [1:76.70%] [N/A:N/A] % Reduction in Volume: 76.70% [N/A:N/A] Starting Position 1 12 (o'clock): Ending Position 1 [1:12] (o'clock): Maximum Distance 1 0.5 (cm): Undermining: [1:Yes] [N/A:N/A] Classification: [1:Grade 1] [N/A:N/A] Exudate Amount: [1:Small] [N/A:N/A] Exudate Type: [1:Serosanguineous] [N/A:N/A] Exudate Color: [1:red, brown] [N/A:N/A] Wound Margin: [1:Distinct, outline attached] [N/A:N/A] Granulation Amount: [1:Large (67-100%)] [N/A:N/A] Granulation Quality: [1:Pink, Pale] [N/A:N/A] Necrotic Amount: None Present (0%) N/A N/A Exposed Structures: Fascia: No N/A N/A Fat: No Tendon: No Muscle: No Joint: No Bone: No Limited to Skin Breakdown Epithelialization: None N/A N/A Periwound Skin Texture: Callus: Yes N/A N/A Edema: No Excoriation: No Induration: No Crepitus: No Fluctuance: No Friable: No Rash: No Scarring: No Periwound Skin Maceration: Yes N/A N/A Moisture: Moist: Yes Dry/Scaly: No Periwound Skin Color: Atrophie Blanche: No N/A N/A Cyanosis: No Ecchymosis: No Erythema: No Hemosiderin Staining: No Mottled:  No Pallor: No Rubor: No Temperature: No Abnormality N/A N/A Tenderness on No N/A N/A Palpation: Wound Preparation: Ulcer Cleansing: N/A N/A Rinsed/Irrigated with Saline Topical Anesthetic Applied: Other: lidocaine 4% Treatment Notes Electronic Signature(s) Signed: 02/22/2016 4:03:28 PM By: Regan Lemming BSN, RN Entered By: Regan Lemming on 02/22/2016 15:27:27 Zappulla, Gerda Diss ([id number]) -------------------------------------------------------------------------------- [place] Details Patient Name: Faulks, Tevan A. Date of Service: 02/22/2016 3:00 PM Medical Record Number: [id number] Patient Account Number: [id number] Date of Birth/Sex: Feb 01, 1956 (60 y.o. Male) Treating RN: Afful, RN, BSN, Velva Harman Primary Care Physician: Viviana Simpler Other Clinician: Referring Physician: Viviana Simpler Treating Physician/Extender: Tito Dine in Treatment: 10 Active Inactive Abuse / Safety / Falls / Self Care Management Nursing Diagnoses: Impaired home maintenance Impaired physical mobility Knowledge deficit related to abuse or neglect Knowledge deficit related to: safety; personal, health (wound), emergency Potential for falls Goals: Patient will remain injury free Date Initiated: 12/14/2015 Goal Status: Active Patient/caregiver will verbalize understanding of skin care regimen Date Initiated: 12/14/2015 Goal Status: Active Patient/caregiver will verbalize/demonstrate measure taken to improve self care Date Initiated: 12/14/2015 Goal Status: Active Patient/caregiver will verbalize/demonstrate measures taken to improve the patient's personal safety Date Initiated: 12/14/2015 Goal Status: Active Patient/caregiver will verbalize/demonstrate measures taken to prevent injury and/or falls Date Initiated: 12/14/2015 Goal Status: Active Patient/caregiver will verbalize/demonstrate understanding of what to do in case of emergency Date Initiated: 12/14/2015 Goal Status:  Active Interventions: Assess fall risk on admission and as needed Assess: immobility, friction, shearing, incontinence upon admission and as needed Assess impairment of mobility on admission and as needed per policy Assess self care needs on admission and as needed Patient referred to community resources (specify in notes) Sandlin, TWAIN POLLAN. ([id number]) Provide education on basic hygiene Provide education on personal and home safety Provide education on safe transfers Provide education on vaccinations Treatment Activities: Education provided on Basic Hygiene : 01/04/2016 Notes: Orientation to the Wound Care Program Nursing Diagnoses: Knowledge deficit related to the wound healing center program Goals: Patient/caregiver will verbalize understanding of the [facility] Date Initiated: 12/14/2015 Goal Status: Active Interventions: Provide education on orientation to the wound center Notes: Peripheral Neuropathy Nursing Diagnoses: Knowledge deficit related to disease process and management of peripheral neurovascular dysfunction Potential alteration in peripheral tissue perfusion (select prior to confirmation of diagnosis) Goals: Patient/caregiver will verbalize understanding of disease process and disease management Date Initiated: 12/14/2015 Goal Status: Active Interventions: Assess signs and symptoms of neuropathy upon admission and as needed Provide education on Management of Neuropathy and Related Ulcers Provide education on Management of Neuropathy upon discharge from the [facility] Activities: Patient referred for customized footwear/offloading :  12/14/2015 Notes: Wound/Skin Impairment [place], SHAZIL HILAIRE ([id number]) Nursing Diagnoses: Impaired tissue integrity Knowledge deficit related to ulceration/compromised skin integrity Goals: Patient/caregiver will verbalize understanding of skin care regimen Date Initiated: 12/14/2015 Goal Status:  Active Ulcer/skin breakdown will have a volume reduction of 30% by week 4 Date Initiated: 12/14/2015 Goal Status: Active Ulcer/skin breakdown will have a volume reduction of 50% by week 8 Date Initiated: 12/14/2015 Goal Status: Active Ulcer/skin breakdown will have a volume reduction of 80% by week 12 Date Initiated: 12/14/2015 Goal Status: Active Ulcer/skin breakdown will heal within 14 weeks Date Initiated: 12/14/2015 Goal Status: Active Interventions: Assess patient/caregiver ability to obtain necessary supplies Assess patient/caregiver ability to perform ulcer/skin care regimen upon admission and as needed Assess ulceration(s) every visit Provide education on ulcer and skin care Treatment Activities: Referred to DME Beryle Zeitz for dressing supplies : 12/14/2015 Skin care regimen initiated : 12/14/2015 Topical wound management initiated : 12/14/2015 Notes: Electronic Signature(s) Signed: 02/22/2016 4:03:28 PM By: Regan Lemming BSN, RN Entered By: Regan Lemming on 02/22/2016 15:22:07 Zagal, Gerda Diss ([id number]) -------------------------------------------------------------------------------- Pain Assessment Details Patient Name: Morson, Montray A. Date of Service: 02/22/2016 3:00 PM Medical Record Number: [id number] Patient Account Number: [id number] Date of Birth/Sex: [birth year] (60 y.o. Male) Treating RN: Afful, RN, BSN, Velva Harman Primary Care Physician: Viviana Simpler Other Clinician: Referring Physician: Viviana Simpler Treating Physician/Extender: Tito Dine in Treatment: 10 Active Problems Location of Pain Severity and Description of Pain Patient Has Paino No Site Locations With Dressing Change: No Pain Management and Medication Current Pain Management: Electronic Signature(s) Signed: 02/22/2016 4:03:28 PM By: Regan Lemming BSN, RN Entered By: Regan Lemming on 02/22/2016 15:14:18 Culp, Gerda Diss  ([id number]) -------------------------------------------------------------------------------- Patient/Caregiver Education Details Patient Name: Sydney, Erez A. Date of Service: 02/22/2016 3:00 PM Medical Record Number: [id number] Patient Account Number: [id number] Date of Birth/Gender: Jul 28, 1955 (60 y.o. Male) Treating RN: Afful, RN, BSN, Velva Harman Primary Care Physician: Viviana Simpler Other Clinician: Referring Physician: Viviana Simpler Treating Physician/Extender: Tito Dine in Treatment: 10 Education Assessment Education Provided To: Patient Education Topics Provided Basic Hygiene: Methods: Explain/Verbal Responses: State content correctly Peripheral Neuropathy: Methods: Explain/Verbal Responses: State content correctly Safety: Methods: Explain/Verbal Responses: State content correctly Tissue Oxygenation: Methods: Explain/Verbal Responses: State content correctly Welcome To The [facility]: Methods: Explain/Verbal Responses: State content correctly Wound/Skin Impairment: Methods: Explain/Verbal Responses: State content correctly Electronic Signature(s) Signed: 02/22/2016 4:03:28 PM By: Regan Lemming BSN, RN Entered By: Regan Lemming on 02/22/2016 15:41:37 Brouwer, Gerda Diss ([id number]) -------------------------------------------------------------------------------- Wound Assessment Details Patient Name: Olivencia, Zymier A. Date of Service: 02/22/2016 3:00 PM Medical Record Number: [id number] Patient Account Number: [id number] Date of Birth/Sex: [birth year] (60 y.o. Male) Treating RN: Afful, RN, BSN, Velva Harman Primary Care Physician: Viviana Simpler Other Clinician: Referring Physician: Viviana Simpler Treating Physician/Extender: Tito Dine in Treatment: 10 Wound Status Wound Number: 1 Primary Diabetic Wound/Ulcer of the Lower Etiology: Extremity Wound Location: Foot - Plantar Wound Status: Open Wounding Event: Gradually Appeared Comorbid Hypertension,  Type II Diabetes, Date Acquired: 08/03/2015 History: Gout Weeks Of Treatment: 10 Clustered Wound: No Photos Photo Uploaded By: Regan Lemming on 02/22/2016 16:08:11 Wound Measurements Length: (cm) 0.7 Width: (cm) 0.7 Depth: (cm) 0.4 Area: (cm) 0.385 Volume: (cm) 0.154 % Reduction in Area: 76.7% % Reduction in Volume: 76.7% Epithelialization: None Tunneling: No Undermining: Yes Starting Position (o'clock): 12 Ending Position (o'clock): 12 Maximum Distance: (cm) 0.5 Wound Description Classification: Grade 1 Wound Margin: Distinct, outline attached Exudate Amount: Small Exudate Type: Serosanguineous Exudate Color: red, brown Foul Odor  After Cleansing: No Wound Bed Forbis, Oumar A. ([id number]) Granulation Amount: Large (67-100%) Exposed Structure Granulation Quality: Pink, Pale Fascia Exposed: No Necrotic Amount: None Present (0%) Fat Layer Exposed: No Tendon Exposed: No Muscle Exposed: No Joint Exposed: No Bone Exposed: No Limited to Skin Breakdown Periwound Skin Texture Texture Color No Abnormalities Noted: No No Abnormalities Noted: No Callus: Yes Atrophie Blanche: No Crepitus: No Cyanosis: No Excoriation: No Ecchymosis: No Fluctuance: No Erythema: No Friable: No Hemosiderin Staining: No Induration: No Mottled: No Localized Edema: No Pallor: No Rash: No Rubor: No Scarring: No Temperature / Pain Moisture Temperature: No Abnormality No Abnormalities Noted: No Dry / Scaly: No Maceration: Yes Moist: Yes Wound Preparation Ulcer Cleansing: Rinsed/Irrigated with Saline Topical Anesthetic Applied: Other: lidocaine 4%, Treatment Notes Wound #1 (Plantar Foot) 1. Cleansed with: Clean wound with Normal Saline 4. Dressing Applied: Aquacel Ag 5. Secondary Dressing Applied Gauze and Kerlix/Conform 7. Secured with Tape Notes felt/front Dietitian) Signed: 02/22/2016 4:03:28 PM By: Regan Lemming BSN, RN Lawrence, Gerda Diss  ([id number]) Entered By: Regan Lemming on 02/22/2016 15:21:05 Lindblad, Gerda Diss ([id number]) -------------------------------------------------------------------------------- Vitals Details Patient Name: Lech, Tran A. Date of Service: 02/22/2016 3:00 PM Medical Record Number: [id number] Patient Account Number: [id number] Date of Birth/Sex: 13-Sep-1955 (60 y.o. Male) Treating RN: Afful, RN, BSN, Velva Harman Primary Care Physician: Viviana Simpler Other Clinician: Referring Physician: Viviana Simpler Treating Physician/Extender: Tito Dine in Treatment: 10 Vital Signs Time Taken: 15:15 Temperature (F): 98.1 Height (in): 72 Pulse (bpm): 87 Weight (lbs): 223 Respiratory Rate (breaths/min): 16 Body Mass Index (BMI): 30.2 Blood Pressure (mmHg): 150/74 Reference Range: 80 - 120 mg / dl Electronic Signature(s) Signed: 02/22/2016 4:03:28 PM By: Regan Lemming BSN, RN Entered By: Regan Lemming on 02/22/2016 15:19:43

## 2016-02-23 NOTE — Progress Notes (Signed)
[name], Bradley SENS ([id number]) Visit Report for 02/22/2016 Chief Complaint Document Details Patient Name: [name], [name] Date of Service: 02/22/2016 3:00 PM Medical Record Patient Account Number: [id number] Number: Treating RN: Baruch Gouty, RN, BSN, Rita 08-16-1955 [phone] y.o. Other Clinician: Date of Birth/Sex: Male) Treating Everlean Bucher Primary Care Physician: Viviana Simpler Physician/Extender: G Referring Physician: Marily Memos in Treatment: 10 Information Obtained from: Patient Chief Complaint 12/14/15; this is a 60 year old man who is a type II diabetic with diabetic neuropathy on insulin and metformin. Presents with a nonhealing ulcer on his right foot since January of this year Electronic Signature(s) Signed: 02/23/2016 7:57:27 AM By: Linton Ham MD Entered By: Linton Ham on 02/22/2016 16:09:16 [name], [name] ([id number]) -------------------------------------------------------------------------------- Debridement Details Patient Name: [name], [name] Date of Service: 02/22/2016 3:00 PM Medical Record Patient Account Number: [id number] Number: Treating RN: Baruch Gouty, RN, BSN, Rita 07/14/1956 [phone] y.o. Other Clinician: Date of Birth/Sex: Male) Treating Kenyonna Micek Primary Care Physician: Viviana Simpler Physician/Extender: G Referring Physician: Marily Memos in Treatment: 10 Debridement Performed for Wound #1 Plantar Foot Assessment: Performed By: Physician Ricard Dillon, MD Debridement: Debridement Pre-procedure Yes Verification/Time Out Taken: Start Time: 15:27 Pain Control: Lidocaine 4% Topical Solution Level: Skin/Subcutaneous Tissue Total Area Debrided (L x 0.7 (cm) x 0.7 (cm) = 0.49 (cm) W): Tissue and other Non-Viable, Callus, Fibrin/Slough, Subcutaneous material debrided: Instrument: Blade, Forceps Bleeding: Minimum Hemostasis Achieved: Pressure End Time: 15:32 Procedural Pain: 0 Post Procedural Pain: 0 Response  to Treatment: Procedure was tolerated well Post Debridement Measurements of Total Wound Length: (cm) 1.3 Width: (cm) 1 Depth: (cm) 0.3 Volume: (cm) 0.306 Post Procedure Diagnosis Same as Pre-procedure Electronic Signature(s) Signed: 02/23/2016 7:57:27 AM By: Linton Ham MD Signed: 02/23/2016 11:55:54 AM By: Regan Lemming BSN, RN Previous Signature: 02/22/2016 4:03:28 PM Version By: Regan Lemming BSN, RN Entered By: Linton Ham on 02/22/2016 16:07:40 Downs, [name] ([id number]) [name], [name] ([id number]) -------------------------------------------------------------------------------- HPI Details Patient Name: [name], [name] Date of Service: 02/22/2016 3:00 PM Medical Record Patient Account Number: [id number] Number: Treating RN: Baruch Gouty, RN, BSN, Rita 08/01/1955 [phone] y.o. Other Clinician: Date of Birth/Sex: Male) Treating Anaisabel Pederson Primary Care Physician: Viviana Simpler Physician/Extender: G Referring Physician: Marily Memos in Treatment: 10 History of Present Illness HPI Description: 12/14/15; this is a 60 year old type II diabetic on insulin with diabetic polyneuropathy. He apparently had a callus at roughly his fourth metatarsal head in January he was attempting to remove however he ended up with a ulcer. His wife who was present said they have been using saline to clean this wound and using Polysporin and Betadine- but not making much progress. His previous surgery by his podiatrist Dr. Milinda Pointer in 2014 but does not wish to go back to see Dr. Milinda Pointer and was referred here instead. He has not been on any antibiotics and has not had recent x-rays of the foot. He has no known polyneuropathy and no prior wound history looking [facility] it appears that the patient had foot corrective surgery on 04/02/13 this consisted of an [facility] bunion repair on the right second metatarsal, osteotomy of the right fifth metatarsal, osteotomy of the right  hammertoe repair on #2 #3 in #5. Sometime after this he injured the foot he was noted to have fractures with fracture of the first metatarsal osteotomy fracture of the second metatarsal osteotomy dislocation of the third metatarsal phalangeal joint and fracture of the fifth metatarsal osteotomy site here and I believe he went  back to the operating room and had internal fixation of the right foot. He has not had any recent x-rays of the right foot. He had an MRI of the right foot in 2014 last hemoglobin A1c I see is at 7.1 on 10/29/15. He had a normal serum albumin on 04/29/15 of 4.8. He does not have any nutritional issues 12/21/15; x-ray of the foot that showed no acute bony abnormality no radiopaque foreign bodies. Postsurgical changes in the right first metatarsal were stable healing of fractures in the distal second metatarsal, old healed right fifth metatarsal fracture cortical erosions noted in the right third and fourth metatarsals. We have been using Prisma 12/28/15 the area on his right foot at roughly the fourth metatarsal head is unchanged. Still some circumferential callus and nonviable subcutaneous tissue which requires debridement. No obvious infection 01/04/16; wound is unchanged. Notable for less circumferential callus. Still non-viable subcutaneous tissue which requires debridement. No obvious infection. The patient did not come prepared for a total contact cast we will put this on next week 01/12/16; dimensions of the wound unchanged. The patient did not want a total contact cast today but is prepared to talk about this in 2-3 weeks if there is no progression towards resolution. 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 01/18/16; I don't see much change in this wound which is on the right fourth metatarsal head. I have not been able to talk him into a total contact cast. He continues to offload this with the Darco forefoot offloading boot,  scooter at work etc. 02/01/16:no changes. no significant improvement with wound. no reported systemic s/s of infection. 02/22/16 right fourth metatarsal head. Still inferiorly deep wound. Debridement of circumferential callus nonviable subcutaneous tissue and surface slough. This apparently measures slightly smaller although not a major change since the last time I saw him. I have once again brought up the issue of a total contact cast. He works at [employer]. ([id number]) Electronic Signature(s) Signed: 02/23/2016 7:57:27 AM By: Linton Ham MD Entered By: Linton Ham on 02/22/2016 16:13:50 [name], [name] ([id number]) -------------------------------------------------------------------------------- Physical Exam Details Patient Name: [name], [name] Date of Service: 02/22/2016 3:00 PM Medical Record Patient Account Number: [id number] Number: Treating RN: Baruch Gouty, RN, BSN, Rita [birth year] [phone] y.o. Other Clinician: Date of Birth/Sex: Male) Treating Willette Mudry Primary Care Physician: Viviana Simpler Physician/Extender: G Referring Physician: Marily Memos in Treatment: 10 Notes Wound exam; really not a large change since last time I saw this. Perhaps slightly smaller although with some depth. I removed some surface callus nonviable subcutaneous tissue and fibrinous surface slough. This really doesn't look unhealthy after debridement. There is no evidence of infection. I think this should respond to adequate offloading Electronic Signature(s) Signed: 02/23/2016 7:57:27 AM By: Linton Ham MD Entered By: Linton Ham on 02/22/2016 16:14:47 Pankratz, [name] ([id number]) -------------------------------------------------------------------------------- Physician Orders Details Patient Name: [name], [name] Date of Service: 02/22/2016 3:00 PM Medical Record Patient Account Number: [id number] Number: Treating RN: Baruch Gouty, RN, BSN, Rita 19-Aug-1955 [phone]  y.o. Other Clinician: Date of Birth/Sex: Male) Treating Dareld Mcauliffe Primary Care Physician: Viviana Simpler Physician/Extender: G Referring Physician: Marily Memos in Treatment: 10 Verbal / Phone Orders: Yes Clinician: Afful, RN, BSN, Rita Read Back and Verified: Yes Diagnosis Coding Wound Cleansing Wound #1 Plantar Foot o Cleanse wound with mild soap and water o May Shower, gently pat wound dry prior to applying new dressing. o May shower with  protection. Primary Wound Dressing Wound #1 Plantar Foot o Aquacel Ag Secondary Dressing Wound #1 Plantar Foot o Gauze and Kerlix/Conform Dressing Change Frequency Wound #1 Plantar Foot o Change dressing every other day. Follow-up Appointments Wound #1 Plantar Foot o Return Appointment in 1 week. 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 o Activity as tolerated Karabin, CAELEB NAZARIO ([id number]) Electronic Signature(s) Signed: 02/22/2016 4:03:28 PM By: Regan Lemming BSN, RN Signed: 02/23/2016 7:57:27 AM By: Linton Ham MD Entered By: Regan Lemming on 02/22/2016 15:39:21 Vizzini, [name] ([id number]) -------------------------------------------------------------------------------- Problem List Details Patient Name: [name], [name] Date of Service: 02/22/2016 3:00 PM Medical Record Patient Account Number: [id number] Number: Treating RN: Baruch Gouty, RN, BSN, Rita [birth year] [phone] y.o. 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 Date of Service: 02/22/2016 3:00 PM Medical Record Patient Account Number: [id number] Number: Treating RN: Baruch Gouty, RN, BSN, Rita 10/15/1955 [phone] y.o. Other Clinician: Date of Birth/Sex: Male) Treating Exie Chrismer Primary Care Physician: Viviana Simpler Physician/Extender: G Referring Physician: Marily Memos in Treatment: 10 Subjective Chief Complaint Information obtained from Patient 12/14/15; this is a 60 year old man who is a type II diabetic with diabetic neuropathy on insulin and metformin. Presents with a nonhealing ulcer on his right foot since January of this year History of Present Illness (HPI) 12/14/15; this is a 60 year old type II diabetic on insulin with diabetic polyneuropathy. He apparently had a callus at roughly his fourth metatarsal head in January he was attempting to remove however he ended up with a ulcer. His wife who was present said they have been using saline to clean this wound and using Polysporin and Betadine- but not making much progress. His previous surgery by his podiatrist Dr. Milinda Pointer in 2014 but does not wish to go back to see Dr. Milinda Pointer and was referred here instead. He has not been on any antibiotics and has not had recent x-rays of the foot. He has no known polyneuropathy and no prior wound history looking [facility] it appears that the patient had foot corrective surgery on 04/02/13 this consisted of an [facility] bunion repair on the right second metatarsal, osteotomy of  the right fifth metatarsal, osteotomy of the right hammertoe repair on #2 #3 in #5. Sometime after this he injured the foot he was noted to have fractures with fracture of the first metatarsal osteotomy fracture of the second metatarsal osteotomy dislocation of the third metatarsal phalangeal joint and fracture of the fifth metatarsal osteotomy site here and I believe he went back to the operating room and had internal fixation of the right foot. He has not had any recent x-rays of the right foot. He had an MRI of the right foot in 2014 last hemoglobin A1c I see is at 7.1 on 10/29/15. He had a normal serum  albumin on 04/29/15 of 4.8. He does not have any nutritional issues 12/21/15; x-ray of the foot that showed no acute bony abnormality no radiopaque foreign bodies. Postsurgical changes in the right first metatarsal were stable healing of fractures in the distal second metatarsal, old healed right fifth metatarsal fracture cortical erosions noted in the right third and fourth metatarsals. We have been using Prisma 12/28/15 the area on his right foot at roughly the fourth metatarsal head is unchanged. Still some circumferential callus and nonviable subcutaneous tissue which requires debridement. No obvious infection 01/04/16; wound is unchanged. Notable for less circumferential callus. Still non-viable subcutaneous tissue which requires debridement. No obvious infection. The patient did not come prepared for a total contact cast we will put this on next week 01/12/16; dimensions of the wound unchanged. The patient did not want a total contact cast today but is prepared to talk about this in 2-3 weeks if there is no progression towards resolution. 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 01/18/16; I don't see much change in this wound which is on the right fourth metatarsal head. I have not [name], [name] ([id number]) been able to talk him into a total contact  cast. He continues to offload this with the Darco forefoot offloading boot, scooter at work etc. 02/01/16:no changes. no significant improvement with wound. no reported systemic s/s of infection. 02/22/16 right fourth metatarsal head. Still inferiorly deep wound. Debridement of circumferential callus nonviable subcutaneous tissue and surface slough. This apparently measures slightly smaller although not a major change since the last time I saw him. I have once again brought up the issue of a total contact cast. He works at [employer] Time Taken: 3:15 PM, Height: 72 in, Weight: 223 lbs, BMI: 30.2, Temperature: 98.1 F, Pulse: 87 bpm, Respiratory Rate: 16 breaths/min, Blood Pressure: 150/74 mmHg. Integumentary (Hair, Skin) Wound #1 status is Open. Original cause of wound was Gradually Appeared. The wound is located on the Plantar Foot. The wound measures 0.7cm length x 0.7cm width x 0.4cm depth; 0.385cm^2 area and 0.154cm^3 volume. The wound is limited to skin breakdown. There is no tunneling noted, however, there is undermining starting at 12:00 and ending at 12:00 with a maximum distance of 0.5cm. There is a small amount of serosanguineous drainage noted. The wound margin is distinct with the outline attached to the wound base. There is large (67-100%) pink, pale granulation within the wound bed. There is no necrotic tissue within the wound bed. The periwound skin appearance exhibited: Callus, Maceration, Moist. The periwound skin appearance did not exhibit: Crepitus, Excoriation, Fluctuance, Friable, Induration, Localized Edema, Rash, Scarring, Dry/Scaly, Atrophie Blanche, Cyanosis, Ecchymosis, Hemosiderin Staining, Mottled, Pallor, Rubor, Erythema. Periwound temperature was noted as No Abnormality. Assessment Active Problems ICD-10 E11.621 - Type 2 diabetes mellitus with foot ulcer E11.42 - Type 2 diabetes mellitus with diabetic polyneuropathy L97.513 -  Non-pressure chronic ulcer of other part of right foot with necrosis of muscle [name], [name] ([id number]) Procedures Wound #1 Wound #1 is a Diabetic Wound/Ulcer of the Lower Extremity located on the Plantar Foot . There was a Skin/Subcutaneous Tissue Debridement [id number]) debridement with total area of 0.49 sq cm performed by Ricard Dillon, MD. with the following instrument(s): Blade and Forceps to remove Non-Viable tissue/material including Fibrin/Slough, Callus, and Subcutaneous after achieving pain control using Lidocaine 4% Topical Solution. A time out was conducted prior to the start of the procedure. A Minimum amount of bleeding  was controlled with Pressure. The procedure was tolerated well with a pain level of 0 throughout and a pain level of 0 following the procedure. Post Debridement Measurements: 1.3cm length x 1cm width x 0.3cm depth; 0.306cm^3 volume. Post procedure Diagnosis Wound #1: Same as Pre-Procedure Plan Wound Cleansing: Wound #1 Plantar Foot: Cleanse wound with mild soap and water May Shower, gently pat wound dry prior to applying new dressing. May shower with protection. Primary Wound Dressing: Wound #1 Plantar Foot: Aquacel Ag Secondary Dressing: Wound #1 Plantar Foot: Gauze and Kerlix/Conform Dressing Change Frequency: Wound #1 Plantar Foot: Change dressing every other day. Follow-up Appointments: Wound #1 Plantar Foot: Return Appointment in 1 week. Off-Loading: Wound #1 Plantar Foot: Open toe surgical shoe to: - Front offloader darco Other: - felt Additional Orders / Instructions: Wound #1 Plantar Foot: Increase protein intake. Activity as tolerated [name], [name] ([id number]) #1 we'll continue with Aquacel Ag, I have got him to agree to a total contact cast next week, consider changing the Prisma at that time. Hopefully he will agree to this next week Electronic Signature(s) Signed: 02/23/2016 7:57:27 AM By: Linton Ham MD Entered By:  Linton Ham on 02/22/2016 16:15:57 [name], [name] ([id number]) -------------------------------------------------------------------------------- [place] Details Patient Name: [name], Yosmar A. Date of Service: 02/22/2016 Medical Record Patient Account Number: [id number] Number: Treating RN: Baruch Gouty, RN, BSN, Rita 1956-07-24 [phone] y.o. Other Clinician: Date of Birth/Sex: Male) Treating Kairi Harshbarger, [place] Primary Care Physician: Viviana Simpler Physician/Extender: G Referring Physician: Marily Memos in Treatment: 10 Diagnosis Coding ICD-10 Codes Code Description E11.621 Type 2 diabetes mellitus with foot ulcer E11.42 Type 2 diabetes mellitus with diabetic polyneuropathy L97.513 Non-pressure chronic ulcer of other part of right foot with necrosis of muscle Facility Procedures CPT4 Code: [id number] Description: [id number] - DEB SUBQ TISSUE 20 SQ CM/< ICD-10 Description Diagnosis E11.621 Type 2 diabetes mellitus with foot ulcer Modifier: Quantity: 1 Physician Procedures CPT4 Code: [id number] Description: [id number] - WC PHYS SUBQ TISS 20 SQ CM ICD-10 Description Diagnosis E11.621 Type 2 diabetes mellitus with foot ulcer Modifier: Quantity: 1 Electronic Signature(s) Signed: 02/23/2016 7:57:27 AM By: Linton Ham MD Entered By: Linton Ham on 02/22/2016 16:16:19

## 2016-02-29 ENCOUNTER — Encounter: Payer: 59 | Admitting: Surgery

## 2016-02-29 DIAGNOSIS — E11621 Type 2 diabetes mellitus with foot ulcer: Secondary | ICD-10-CM | POA: Diagnosis not present

## 2016-02-29 NOTE — Progress Notes (Signed)
[name] ([id number]) Visit Report for 02/29/2016 Chief Complaint Document Details Patient Name: [name], [name] Date of Service: 02/29/2016 1:30 PM Medical Record Number: [id number] Patient Account Number: [id number] Date of Birth/Sex: [birth year] (60 y.o. Male) Treating RN: Afful, RN, BSN, Velva Harman Primary Care Physician: Viviana Simpler Other Clinician: Referring Physician: Viviana Simpler Treating Physician/Extender: Frann Rider in Treatment: 11 Information Obtained from: Patient Chief Complaint 12/14/15; this is a 60 year old man who is a type II diabetic with diabetic neuropathy on insulin and metformin. Presents with a nonhealing ulcer on his right foot since January of this year Electronic Signature(s) Signed: 02/29/2016 1:46:11 PM By: Christin Fudge MD, FACS Entered By: Christin Fudge on 02/29/2016 13:46:11 Hyle, Gerda Diss ([id number]) -------------------------------------------------------------------------------- Debridement Details Patient Name: [name], [name] Date of Service: 02/29/2016 1:30 PM Medical Record Number: [id number] Patient Account Number: [id number] Date of Birth/Sex: [birth year] (61 y.o. Male) Treating RN: Afful, RN, BSN, Velva Harman Primary Care Physician: Viviana Simpler Other Clinician: Referring Physician: Viviana Simpler Treating Physician/Extender: Frann Rider in Treatment: 11 Debridement Performed for Wound #1 Plantar Foot Assessment: Performed By: Physician Christin Fudge, MD Debridement: Debridement Pre-procedure Yes Verification/Time Out Taken: Start Time: 13:37 Pain Control: Lidocaine 4% Topical Solution Level: Skin/Subcutaneous Tissue Total Area Debrided (L x 0.9 (cm) x 0.5 (cm) = 0.45 (cm) W): Tissue and other Viable, Non-Viable, Fibrin/Slough, Skin, Subcutaneous material debrided: Instrument: Forceps, Scissors Bleeding: Minimum Hemostasis Achieved: Pressure End Time: 13:42 Procedural Pain: 0 Post Procedural Pain: 0 Response to  Treatment: Procedure was tolerated well Post Debridement Measurements of Total Wound Length: (cm) 0.9 Width: (cm) 0.5 Depth: (cm) 0.5 Volume: (cm) 0.177 Post Procedure Diagnosis Same as Pre-procedure Electronic Signature(s) Signed: 02/29/2016 1:46:04 PM By: Christin Fudge MD, FACS Signed: 02/29/2016 4:40:39 PM By: Regan Lemming BSN, RN Entered By: Christin Fudge on 02/29/2016 13:46:03 Hunkins, Gerda Diss ([id number]) -------------------------------------------------------------------------------- HPI Details Patient Name: [name], [name] Date of Service: 02/29/2016 1:30 PM Medical Record Number: [id number] Patient Account Number: [id number] Date of Birth/Sex: 1955/08/05 (60 y.o. Male) Treating RN: Afful, RN, BSN, Velva Harman Primary Care Physician: Viviana Simpler Other Clinician: Referring Physician: Viviana Simpler Treating Physician/Extender: Frann Rider in Treatment: 11 History of Present Illness HPI Description: 12/14/15; this is a 60 year old type II diabetic on insulin with diabetic polyneuropathy. He apparently had a callus at roughly his fourth metatarsal head in January he was attempting to remove however he ended up with a ulcer. His wife who was present said they have been using saline to clean this wound and using Polysporin and Betadine- but not making much progress. His previous surgery by his podiatrist Dr. Milinda Pointer in 2014 but does not wish to go back to see Dr. Milinda Pointer and was referred here instead. He has not been on any antibiotics and has not had recent x-rays of the foot. He has no known polyneuropathy and no prior wound history looking [facility] it appears that the patient had foot corrective surgery on 04/02/13 this consisted of an [facility] bunion repair on the right second metatarsal, osteotomy of the right fifth metatarsal, osteotomy of the right hammertoe repair on #2 #3 in #5. Sometime after this he injured the foot he was noted to have fractures with  fracture of the first metatarsal osteotomy fracture of the second metatarsal osteotomy dislocation of the third metatarsal phalangeal joint and fracture of the fifth metatarsal osteotomy site here and I believe he went back to the operating room and had internal fixation of the right foot. He has not  had any recent x-rays of the right foot. He had an MRI of the right foot in 2014 last hemoglobin A1c I see is at 7.1 on 10/29/15. He had a normal serum albumin on 04/29/15 of 4.8. He does not have any nutritional issues 12/21/15; x-ray of the foot that showed no acute bony abnormality no radiopaque foreign bodies. Postsurgical changes in the right first metatarsal were stable healing of fractures in the distal second metatarsal, old healed right fifth metatarsal fracture cortical erosions noted in the right third and fourth metatarsals. We have been using Prisma 12/28/15 the area on his right foot at roughly the fourth metatarsal head is unchanged. Still some circumferential callus and nonviable subcutaneous tissue which requires debridement. No obvious infection 01/04/16; wound is unchanged. Notable for less circumferential callus. Still non-viable subcutaneous tissue which requires debridement. No obvious infection. The patient did not come prepared for a total contact cast we will put this on next week 01/12/16; dimensions of the wound unchanged. The patient did not want a total contact cast today but is prepared to talk about this in 2-3 weeks if there is no progression towards resolution. 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 01/18/16; I don't see much change in this wound which is on the right fourth metatarsal head. I have not been able to talk him into a total contact cast. He continues to offload this with the Darco forefoot offloading boot, scooter at work etc. 02/01/16:no changes. no significant improvement with wound. no reported systemic s/s of  infection. 02/22/16 right fourth metatarsal head. Still inferiorly deep wound. Debridement of circumferential callus nonviable subcutaneous tissue and surface slough. This apparently measures slightly smaller although not a major change since the last time I saw him. I have once again brought up the issue of a total contact cast. He works at [employer] 02/29/2016 -- continue to have a TCC applied today but due to social reasons he has opted not to get it done this week and will agree to get it done next week. Bienkowski, MORDCHA BURDETT ([id number]) Electronic Signature(s) Signed: 02/29/2016 1:46:44 PM By: Christin Fudge MD, FACS Entered By: Christin Fudge on 02/29/2016 13:46:44 Stege, Gerda Diss ([id number]) -------------------------------------------------------------------------------- Physical Exam Details Patient Name: Malcomb, Randi A. Date of Service: 02/29/2016 1:30 PM Medical Record Number: [id number] Patient Account Number: [id number] Date of Birth/Sex: 1956-02-23 (60 y.o. Male) Treating RN: Baruch Gouty, RN, BSN, Velva Harman Primary Care Physician: Viviana Simpler Other Clinician: Referring Physician: Viviana Simpler Treating Physician/Extender: Frann Rider in Treatment: 11 Constitutional . Pulse regular. Respirations normal and unlabored. Afebrile. . Eyes Nonicteric. Reactive to light. Ears, Nose, Mouth, and Throat Lips, teeth, and gums WNL.Marland Kitchen Moist mucosa without lesions. Neck supple and nontender. No palpable supraclavicular or cervical adenopathy. Normal sized without goiter. Respiratory WNL. No retractions.. Breath sounds WNL, No rubs, rales, rhonchi, or wheeze.. Cardiovascular Heart rhythm and rate regular, no murmur or gallop.. Pedal Pulses WNL. No clubbing, cyanosis or edema. Chest Breasts symmetical and no nipple discharge.. Breast tissue WNL, no masses, lumps, or tenderness.. Lymphatic No adneopathy. No adenopathy. No adenopathy. Musculoskeletal Adexa without tenderness or enlargement..  Digits and nails w/o clubbing, cyanosis, infection, petechiae, ischemia, or inflammatory conditions.. Integumentary (Hair, Skin) No suspicious lesions. No crepitus or fluctuance. No peri-wound warmth or erythema. No masses.Marland Kitchen Psychiatric Judgement and insight Intact.. No evidence of depression, anxiety, or agitation.. Notes wound has quite a bit of undermining at the inferior and superior aspect and using a forcep  and scissors I have sharply debrided all the overhanging edges and saucerized the wound nicely. It does not probe down to bone. Electronic Signature(s) Signed: 02/29/2016 1:47:21 PM By: Christin Fudge MD, FACS Entered By: Christin Fudge on 02/29/2016 13:47:21 Scarfo, Gerda Diss ([id number]) -------------------------------------------------------------------------------- Physician Orders Details Patient Name: Lanzer, Arlo A. Date of Service: 02/29/2016 1:30 PM Medical Record Number: [id number] Patient Account Number: [id number] Date of Birth/Sex: 1956-02-26 (60 y.o. Male) Treating RN: Montey Hora Primary Care Physician: Viviana Simpler Other Clinician: Referring Physician: Viviana Simpler Treating Physician/Extender: Frann Rider in Treatment: 56 Verbal / Phone Orders: Yes Clinician: Montey Hora Read Back and Verified: Yes Diagnosis Coding Wound Cleansing Wound #1 Plantar Foot o Cleanse wound with mild soap and water o May Shower, gently pat wound dry prior to applying new dressing. o May shower with protection. Primary Wound Dressing Wound #1 Plantar Foot o Prisma Ag Secondary Dressing Wound #1 Plantar Foot o Gauze and Kerlix/Conform Dressing Change Frequency Wound #1 Plantar Foot o Change dressing every other day. Follow-up Appointments Wound #1 Plantar Foot o Return Appointment in 1 week. Off-Loading Wound #1 Plantar Foot o Open toe surgical shoe to: - Engineer, production o Other: - felt Additional Orders / Instructions Wound #1  Plantar Foot o Increase protein intake. o Activity as tolerated Electronic Signature(s) Signed: 02/29/2016 4:16:34 PM By: Christin Fudge MD, FACS Llamas, JACODY CAPITO ([id number]) Signed: 02/29/2016 4:35:40 PM By: Montey Hora Entered By: Montey Hora on 02/29/2016 13:44:32 Kartes, Gerda Diss ([id number]) -------------------------------------------------------------------------------- Problem List Details Patient Name: Mccammon, Toivo A. Date of Service: 02/29/2016 1:30 PM Medical Record Number: [id number] Patient Account Number: [id number] Date of Birth/Sex: [birth year] (60 y.o. Male) Treating RN: Afful, RN, BSN, Velva Harman Primary Care Physician: Viviana Simpler Other Clinician: Referring Physician: Viviana Simpler Treating Physician/Extender: Frann Rider in Treatment: 11 Active Problems ICD-10 Encounter Code Description Active Date Diagnosis E11.621 Type 2 diabetes mellitus with foot ulcer 12/14/2015 Yes E11.42 Type 2 diabetes mellitus with diabetic polyneuropathy 12/14/2015 Yes L97.513 Non-pressure chronic ulcer of other part of right foot with 01/12/2016 Yes necrosis of muscle Inactive Problems Resolved Problems Electronic Signature(s) Signed: 02/29/2016 1:45:51 PM By: Christin Fudge MD, FACS Entered By: Christin Fudge on 02/29/2016 13:45:50 Payment, Gerda Diss ([id number]) -------------------------------------------------------------------------------- Progress Note Details Patient Name: Zarcone, Feras A. Date of Service: 02/29/2016 1:30 PM Medical Record Number: [id number] Patient Account Number: [id number] Date of Birth/Sex: 11-14-1955 (60 y.o. Male) Treating RN: Afful, RN, BSN, Velva Harman Primary Care Physician: Viviana Simpler Other Clinician: Referring Physician: Viviana Simpler Treating Physician/Extender: Frann Rider in Treatment: 11 Subjective Chief Complaint Information obtained from Patient 12/14/15; this is a 60 year old man who is a type II diabetic with diabetic neuropathy on  insulin and metformin. Presents with a nonhealing ulcer on his right foot since January of this year History of Present Illness (HPI) 12/14/15; this is a 60 year old type II diabetic on insulin with diabetic polyneuropathy. He apparently had a callus at roughly his fourth metatarsal head in January he was attempting to remove however he ended up with a ulcer. His wife who was present said they have been using saline to clean this wound and using Polysporin and Betadine- but not making much progress. His previous surgery by his podiatrist Dr. Milinda Pointer in 2014 but does not wish to go back to see Dr. Milinda Pointer and was referred here instead. He has not been on any antibiotics and has not had recent x-rays of the foot. He has no known polyneuropathy and no prior wound history looking  [facility] it appears that the patient had foot corrective surgery on 04/02/13 this consisted of an [facility] bunion repair on the right second metatarsal, osteotomy of the right fifth metatarsal, osteotomy of the right hammertoe repair on #2 #3 in #5. Sometime after this he injured the foot he was noted to have fractures with fracture of the first metatarsal osteotomy fracture of the second metatarsal osteotomy dislocation of the third metatarsal phalangeal joint and fracture of the fifth metatarsal osteotomy site here and I believe he went back to the operating room and had internal fixation of the right foot. He has not had any recent x-rays of the right foot. He had an MRI of the right foot in 2014 last hemoglobin A1c I see is at 7.1 on 10/29/15. He had a normal serum albumin on 04/29/15 of 4.8. He does not have any nutritional issues 12/21/15; x-ray of the foot that showed no acute bony abnormality no radiopaque foreign bodies. Postsurgical changes in the right first metatarsal were stable healing of fractures in the distal second metatarsal, old healed right fifth metatarsal fracture cortical erosions noted in  the right third and fourth metatarsals. We have been using Prisma 12/28/15 the area on his right foot at roughly the fourth metatarsal head is unchanged. Still some circumferential callus and nonviable subcutaneous tissue which requires debridement. No obvious infection 01/04/16; wound is unchanged. Notable for less circumferential callus. Still non-viable subcutaneous tissue which requires debridement. No obvious infection. The patient did not come prepared for a total contact cast we will put this on next week 01/12/16; dimensions of the wound unchanged. The patient did not want a total contact cast today but is prepared to talk about this in 2-3 weeks if there is no progression towards resolution. 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 01/18/16; I don't see much change in this wound which is on the right fourth metatarsal head. I have not been able to talk him into a total contact cast. He continues to offload this with the Darco forefoot offloading boot, scooter at work etc. Arango, Vaughn A. ([id number]) 02/01/16:no changes. no significant improvement with wound. no reported systemic s/s of infection. 02/22/16 right fourth metatarsal head. Still inferiorly deep wound. Debridement of circumferential callus nonviable subcutaneous tissue and surface slough. This apparently measures slightly smaller although not a major change since the last time I saw him. I have once again brought up the issue of a total contact cast. He works at [employer] 02/29/2016 -- continue to have a TCC applied today but due to social reasons he has opted not to get it done this week and will agree to get it done next week. Objective Constitutional Pulse regular. Respirations normal and unlabored. Afebrile. Vitals Time Taken: 1:17 PM, Height: 72 in, Weight: 223 lbs, BMI: 30.2, Temperature: 98.3 F, Pulse: 89 bpm, Respiratory Rate: 16 breaths/min, Blood Pressure: 141/71  mmHg. Eyes Nonicteric. Reactive to light. Ears, Nose, Mouth, and Throat Lips, teeth, and gums WNL.Marland Kitchen Moist mucosa without lesions. Neck supple and nontender. No palpable supraclavicular or cervical adenopathy. Normal sized without goiter. Respiratory WNL. No retractions.. Breath sounds WNL, No rubs, rales, rhonchi, or wheeze.. Cardiovascular Heart rhythm and rate regular, no murmur or gallop.. Pedal Pulses WNL. No clubbing, cyanosis or edema. Chest Breasts symmetical and no nipple discharge.. Breast tissue WNL, no masses, lumps, or tenderness.. Lymphatic No adneopathy. No adenopathy. No adenopathy. Musculoskeletal Adexa without tenderness or enlargement.. Digits and nails  w/o clubbing, cyanosis, infection, petechiae, ischemia, or inflammatory conditions.Marland Kitchen Psychiatric Judgement and insight Intact.. No evidence of depression, anxiety, or agitation.Dema Severin, ERIS HALTEMAN ([id number]) General Notes: wound has quite a bit of undermining at the inferior and superior aspect and using a forcep and scissors I have sharply debrided all the overhanging edges and saucerized the wound nicely. It does not probe down to bone. Integumentary (Hair, Skin) No suspicious lesions. No crepitus or fluctuance. No peri-wound warmth or erythema. No masses.. Wound #1 status is Open. Original cause of wound was Gradually Appeared. The wound is located on the Plantar Foot. The wound measures 0.9cm length x 0.5cm width x 0.5cm depth; 0.353cm^2 area and 0.177cm^3 volume. The wound is limited to skin breakdown. There is no tunneling noted, however, there is undermining starting at 10:00 and ending at 1:00 with a maximum distance of 0.5cm. There is a small amount of serosanguineous drainage noted. The wound margin is distinct with the outline attached to the wound base. There is large (67-100%) pink, pale granulation within the wound bed. There is no necrotic tissue within the wound bed. The periwound skin appearance  exhibited: Moist. The periwound skin appearance did not exhibit: Callus, Crepitus, Excoriation, Fluctuance, Friable, Induration, Localized Edema, Rash, Scarring, Dry/Scaly, Maceration, Atrophie Blanche, Cyanosis, Ecchymosis, Hemosiderin Staining, Mottled, Pallor, Rubor, Erythema. Periwound temperature was noted as No Abnormality. Assessment Active Problems ICD-10 E11.621 - Type 2 diabetes mellitus with foot ulcer E11.42 - Type 2 diabetes mellitus with diabetic polyneuropathy L97.513 - Non-pressure chronic ulcer of other part of right foot with necrosis of muscle The patient has declined to use a total contact cast this week and we will continue with the Darco front offloading shoe, Prisma AG in the wound and an offloading felt. On today's changes his dressing every other day. Procedures Wound #1 Wound #1 is a Diabetic Wound/Ulcer of the Lower Extremity located on the Plantar Foot . There was a Skin/Subcutaneous Tissue Debridement [id number]) debridement with total area of 0.45 sq cm performed by Christin Fudge, MD. with the following instrument(s): Forceps and Scissors to remove Viable and Non-Viable tissue/material including Fibrin/Slough, Skin, and Subcutaneous after achieving pain control using Lidocaine 4% Topical Solution. A time out was conducted prior to the start of the procedure. A Minimum amount of bleeding was controlled with Pressure. The procedure was tolerated well with a pain Henri, Zong A. ([id number]) level of 0 throughout and a pain level of 0 following the procedure. Post Debridement Measurements: 0.9cm length x 0.5cm width x 0.5cm depth; 0.177cm^3 volume. Post procedure Diagnosis Wound #1: Same as Pre-Procedure Plan Wound Cleansing: Wound #1 Plantar Foot: Cleanse wound with mild soap and water May Shower, gently pat wound dry prior to applying new dressing. May shower with protection. Primary Wound Dressing: Wound #1 Plantar Foot: Prisma Ag Secondary  Dressing: Wound #1 Plantar Foot: Gauze and Kerlix/Conform Dressing Change Frequency: Wound #1 Plantar Foot: Change dressing every other day. Follow-up Appointments: Wound #1 Plantar Foot: Return Appointment in 1 week. Off-Loading: Wound #1 Plantar Foot: Open toe surgical shoe to: - Front offloader darco Other: - felt Additional Orders / Instructions: Wound #1 Plantar Foot: Increase protein intake. Activity as tolerated The patient has declined to use a total contact cast this week and we will continue with the Darco front offloading shoe, Prisma AG in the wound and an offloading felt. On today's changes his dressing every other day. Electronic Signature(s) Signed: 02/29/2016 1:48:13 PM By: Christin Fudge MD, FACS Plaia, Gerda Diss ([id number]) Entered By:  Christin Fudge on 02/29/2016 13:48:13 Manwarren, COEN GROSZ ([id number]) -------------------------------------------------------------------------------- SuperBill Details Patient Name: Cervi, Herson A. Date of Service: 02/29/2016 Medical Record Number: [id number] Patient Account Number: [id number] Date of Birth/Sex: 1955/12/29 (60 y.o. Male) Treating RN: Afful, RN, BSN, Velva Harman Primary Care Physician: Viviana Simpler Other Clinician: Referring Physician: Viviana Simpler Treating Physician/Extender: Frann Rider in Treatment: 11 Diagnosis Coding ICD-10 Codes Code Description E11.621 Type 2 diabetes mellitus with foot ulcer E11.42 Type 2 diabetes mellitus with diabetic polyneuropathy L97.513 Non-pressure chronic ulcer of other part of right foot with necrosis of muscle Facility Procedures CPT4: Description Modifier Quantity Code [id number] - DEB SUBQ TISSUE 20 SQ CM/< 1 ICD-10 Description Diagnosis E11.621 Type 2 diabetes mellitus with foot ulcer L97.513 Non-pressure chronic ulcer of other part of right foot with necrosis of muscle Physician Procedures CPT4: Description Modifier Quantity Code [id number] - WC PHYS SUBQ TISS 20 SQ  CM 1 ICD-10 Description Diagnosis E11.621 Type 2 diabetes mellitus with foot ulcer L97.513 Non-pressure chronic ulcer of other part of right foot with necrosis of muscle Electronic Signature(s) Signed: 02/29/2016 1:48:25 PM By: Christin Fudge MD, FACS Entered By: Christin Fudge on 02/29/2016 13:48:24

## 2016-02-29 NOTE — Progress Notes (Signed)
Orlich, ZIYAN LAMBA ([id number]) Visit Report for 02/29/2016 Arrival Information Details Patient Name: [name], [name] Date of Service: 02/29/2016 1:30 PM Medical Record Number: [id number] Patient Account Number: [id number] Date of Birth/Sex: [birth year] (60 y.o. Male) Treating RN: Montey Hora Primary Care Physician: Viviana Simpler Other Clinician: Referring Physician: Viviana Simpler Treating Physician/Extender: Frann Rider in Treatment: 11 Visit Information History Since Last Visit All ordered tests and consults were No Patient Arrived: Ambulatory completed: Arrival Time: 13:14 Added or deleted any medications: No Accompanied By: self Any new allergies or adverse reactions: No Transfer Assistance: None Had a fall or experienced change in No Patient Identification Verified: Yes activities of daily living that may affect Secondary Verification Process Yes risk of falls: Completed: Signs or symptoms of abuse/neglect since last No Patient Requires Transmission-Based No visito Precautions: Hospitalized since last visit: No Patient Has Alerts: Yes Has Dressing in Place as Prescribed: Yes Patient Alerts: HgbA1c Has Footwear/Offloading in Place as Yes 7.0 Prescribed: Right: Wedge Shoe Pain Present Now: No Electronic Signature(s) Signed: 02/29/2016 4:35:40 PM By: Montey Hora Entered By: Montey Hora on 02/29/2016 13:19:24 [name], [name] ([id number]) -------------------------------------------------------------------------------- Encounter Discharge Information Details Patient Name: [name], [name] Date of Service: 02/29/2016 1:30 PM Medical Record Number: [id number] Patient Account Number: [id number] Date of Birth/Sex: [birth year] (60 y.o. Male) Treating RN: Afful, RN, BSN, Velva Harman Primary Care Physician: Viviana Simpler Other Clinician: Referring Physician: Viviana Simpler Treating Physician/Extender: Frann Rider in Treatment: 11 Encounter Discharge Information  Items Discharge Pain Level: 0 Discharge Condition: Stable Ambulatory Status: Ambulatory Discharge Destination: Home Transportation: Private Auto Accompanied By: mother in law Schedule Follow-up Appointment: No Medication Reconciliation completed and provided to Patient/Care No Gustavo Dispenza: Provided on Clinical Summary of Care: 02/29/2016 Form Type Recipient Paper Patient JW Electronic Signature(s) Signed: 02/29/2016 1:57:25 PM By: Regan Lemming BSN, RN Previous Signature: 02/29/2016 1:53:45 PM Version By: Ruthine Dose Entered By: Regan Lemming on 02/29/2016 13:57:25 [name], [name] ([id number]) -------------------------------------------------------------------------------- Lower Extremity Assessment Details Patient Name: [name], [name] Date of Service: 02/29/2016 1:30 PM Medical Record Number: [id number] Patient Account Number: [id number] Date of Birth/Sex: [birth year] (61 y.o. Male) Treating RN: Montey Hora Primary Care Physician: Viviana Simpler Other Clinician: Referring Physician: Viviana Simpler Treating Physician/Extender: Frann Rider in Treatment: 11 Edema Assessment Assessed: [Left: No] Patrice [place]: No] Edema: [Left: N] [Right: o] Vascular Assessment Pulses: Posterior Tibial Dorsalis Pedis Palpable: [Right:Yes] Extremity colors, hair growth, and conditions: Extremity Color: [Right:Normal] Hair Growth on Extremity: [Right:Yes] Temperature of Extremity: [Right:Warm] Capillary Refill: [Right:< 3 seconds] Toe Nail Assessment Left: Right: Thick: No Discolored: No Deformed: No Improper Length and Hygiene: No Electronic Signature(s) Signed: 02/29/2016 4:35:40 PM By: Montey Hora Entered By: Montey Hora on 02/29/2016 13:20:46 [name], [name] ([id number]) -------------------------------------------------------------------------------- Multi Wound Chart Details Patient Name: [name], [name] Date of Service: 02/29/2016 1:30 PM Medical Record Number: [id number] Patient  Account Number: [id number] Date of Birth/Sex: Feb 01, 1956 (60 y.o. Male) Treating RN: Montey Hora Primary Care Physician: Viviana Simpler Other Clinician: Referring Physician: Viviana Simpler Treating Physician/Extender: Frann Rider in Treatment: 11 Vital Signs Height(in): 72 Pulse(bpm): 89 Weight(lbs): 223 Blood Pressure 141/71 (mmHg): Body Mass Index(BMI): 30 Temperature(F): 98.3 Respiratory Rate 16 (breaths/min): Photos: [1:No Photos] [N/A:N/A] Wound Location: [1:Foot - Plantar] [N/A:N/A] Wounding Event: [1:Gradually Appeared] [N/A:N/A] Primary Etiology: [1:Diabetic Wound/Ulcer of the Lower Extremity] [N/A:N/A] Comorbid History: [1:Hypertension, Type II Diabetes, Gout] [N/A:N/A] Date Acquired: [1:08/03/2015] [N/A:N/A] Weeks of Treatment: [1:11] [N/A:N/A] Wound Status: [1:Open] [N/A:N/A] Measurements L x W x D 0.9x0.5x0.5 [N/A:N/A] (cm) Area (cm) : [  1:0.353] [N/A:N/A] Volume (cm) : [1:0.177] [N/A:N/A] % Reduction in Area: [1:78.60%] [N/A:N/A] % Reduction in Volume: 73.20% [N/A:N/A] Starting Position 1 10 (o'clock): Ending Position 1 [1:1] (o'clock): Maximum Distance 1 0.5 (cm): Undermining: [1:Yes] [N/A:N/A] Classification: [1:Grade 1] [N/A:N/A] Exudate Amount: [1:Small] [N/A:N/A] Exudate Type: [1:Serosanguineous] [N/A:N/A] Exudate Color: [1:red, brown] [N/A:N/A] Wound Margin: [1:Distinct, outline attached] [N/A:N/A] Granulation Amount: [1:Large (67-100%)] [N/A:N/A] Granulation Quality: [1:Pink, Pale] [N/A:N/A] Necrotic Amount: None Present (0%) N/A N/A Exposed Structures: Fascia: No N/A N/A Fat: No Tendon: No Muscle: No Joint: No Bone: No Limited to Skin Breakdown Epithelialization: None N/A N/A Periwound Skin Texture: Edema: No N/A N/A Excoriation: No Induration: No Callus: No Crepitus: No Fluctuance: No Friable: No Rash: No Scarring: No Periwound Skin Moist: Yes N/A N/A Moisture: Maceration: No Dry/Scaly: No Periwound Skin  Color: Atrophie Blanche: No N/A N/A Cyanosis: No Ecchymosis: No Erythema: No Hemosiderin Staining: No Mottled: No Pallor: No Rubor: No Temperature: No Abnormality N/A N/A Tenderness on No N/A N/A Palpation: Wound Preparation: Ulcer Cleansing: N/A N/A Rinsed/Irrigated with Saline Topical Anesthetic Applied: Other: lidocaine 4% Treatment Notes Electronic Signature(s) Signed: 02/29/2016 4:35:40 PM By: Montey Hora Entered By: Montey Hora on 02/29/2016 13:34:47 Chandran, [name] ([id number]) -------------------------------------------------------------------------------- [place] Details Patient Name: [name], [name] Date of Service: 02/29/2016 1:30 PM Medical Record Number: [id number] Patient Account Number: [id number] Date of Birth/Sex: [birth year] (60 y.o. Male) Treating RN: Montey Hora Primary Care Physician: Viviana Simpler Other Clinician: Referring Physician: Viviana Simpler Treating Physician/Extender: Frann Rider in Treatment: 67 Active Inactive Abuse / Safety / Falls / Self Care Management Nursing Diagnoses: Impaired home maintenance Impaired physical mobility Knowledge deficit related to abuse or neglect Knowledge deficit related to: safety; personal, health (wound), emergency Potential for falls Goals: Patient will remain injury free Date Initiated: 12/14/2015 Goal Status: Active Patient/caregiver will verbalize understanding of skin care regimen Date Initiated: 12/14/2015 Goal Status: Active Patient/caregiver will verbalize/demonstrate measure taken to improve self care Date Initiated: 12/14/2015 Goal Status: Active Patient/caregiver will verbalize/demonstrate measures taken to improve the patient's personal safety Date Initiated: 12/14/2015 Goal Status: Active Patient/caregiver will verbalize/demonstrate measures taken to prevent injury and/or falls Date Initiated: 12/14/2015 Goal Status: Active Patient/caregiver will  verbalize/demonstrate understanding of what to do in case of emergency Date Initiated: 12/14/2015 Goal Status: Active Interventions: Assess fall risk on admission and as needed Assess: immobility, friction, shearing, incontinence upon admission and as needed Assess impairment of mobility on admission and as needed per policy Assess self care needs on admission and as needed Patient referred to community resources (specify in notes) [name], Bradley SHENEMAN. ([id number]) Provide education on basic hygiene Provide education on personal and home safety Provide education on safe transfers Provide education on vaccinations Treatment Activities: Education provided on Basic Hygiene : 02/22/2016 Notes: Orientation to the Wound Care Program Nursing Diagnoses: Knowledge deficit related to the wound healing center program Goals: Patient/caregiver will verbalize understanding of the [facility] Date Initiated: 12/14/2015 Goal Status: Active Interventions: Provide education on orientation to the wound center Notes: Peripheral Neuropathy Nursing Diagnoses: Knowledge deficit related to disease process and management of peripheral neurovascular dysfunction Potential alteration in peripheral tissue perfusion (select prior to confirmation of diagnosis) Goals: Patient/caregiver will verbalize understanding of disease process and disease management Date Initiated: 12/14/2015 Goal Status: Active Interventions: Assess signs and symptoms of neuropathy upon admission and as needed Provide education on Management of Neuropathy and Related Ulcers Provide education on Management of Neuropathy upon discharge from the [place] Treatment Activities: Patient referred  for customized footwear/offloading : 12/14/2015 Notes: Wound/Skin Impairment [name], [name] ([id number]) Nursing Diagnoses: Impaired tissue integrity Knowledge deficit related to ulceration/compromised skin  integrity Goals: Patient/caregiver will verbalize understanding of skin care regimen Date Initiated: 12/14/2015 Goal Status: Active Ulcer/skin breakdown will have a volume reduction of 30% by week 4 Date Initiated: 12/14/2015 Goal Status: Active Ulcer/skin breakdown will have a volume reduction of 50% by week 8 Date Initiated: 12/14/2015 Goal Status: Active Ulcer/skin breakdown will have a volume reduction of 80% by week 12 Date Initiated: 12/14/2015 Goal Status: Active Ulcer/skin breakdown will heal within 14 weeks Date Initiated: 12/14/2015 Goal Status: Active Interventions: Assess patient/caregiver ability to obtain necessary supplies Assess patient/caregiver ability to perform ulcer/skin care regimen upon admission and as needed Assess ulceration(s) every visit Provide education on ulcer and skin care Treatment Activities: Referred to DME Sheetal Lyall for dressing supplies : 12/14/2015 Skin care regimen initiated : 12/14/2015 Topical wound management initiated : 12/14/2015 Notes: Electronic Signature(s) Signed: 02/29/2016 4:35:40 PM By: Montey Hora Entered By: Montey Hora on 02/29/2016 13:34:37 [name], [name] ([id number]) -------------------------------------------------------------------------------- Pain Assessment Details Patient Name: [name], [name] Date of Service: 02/29/2016 1:30 PM Medical Record Number: [id number] Patient Account Number: [id number] Date of Birth/Sex: 17-Feb-1956 (60 y.o. Male) Treating RN: Montey Hora Primary Care Physician: Viviana Simpler Other Clinician: Referring Physician: Viviana Simpler Treating Physician/Extender: Frann Rider in Treatment: 11 Active Problems Location of Pain Severity and Description of Pain Patient Has Paino No Site Locations With Dressing Change: No Pain Management and Medication Current Pain Management: Electronic Signature(s) Signed: 02/29/2016 4:35:40 PM By: Montey Hora Entered By: Montey Hora on  02/29/2016 13:19:27 [name], [name] ([id number]) -------------------------------------------------------------------------------- Patient/Caregiver Education Details Patient Name: [name], [name] Date of Service: 02/29/2016 1:30 PM Medical Record Number: [id number] Patient Account Number: [id number] Date of Birth/Gender: 04-18-1956 (60 y.o. Male) Treating RN: Montey Hora Primary Care Physician: Viviana Simpler Other Clinician: Referring Physician: Viviana Simpler Treating Physician/Extender: Frann Rider in Treatment: 11 Education Assessment Education Provided To: Patient Education Topics Provided Offloading: Handouts: Other: continue with offloading shoe and TCC Methods: Explain/Verbal Responses: State content correctly Safety: Methods: Explain/Verbal Responses: State content correctly Welcome To [facility]: Methods: Explain/Verbal Responses: State content correctly Wound/Skin Impairment: Methods: Explain/Verbal Responses: State content correctly Electronic Signature(s) Signed: 02/29/2016 4:40:39 PM By: Regan Lemming BSN, RN Entered By: Regan Lemming on 02/29/2016 13:58:32 [name], [name] ([id number]) -------------------------------------------------------------------------------- Wound Assessment Details Patient Name: Tews, Gwyn A. Date of Service: 02/29/2016 1:30 PM Medical Record Number: [id number] Patient Account Number: [id number] Date of Birth/Sex: [birth year] (60 y.o. Male) Treating RN: Montey Hora Primary Care Physician: Viviana Simpler Other Clinician: Referring Physician: Viviana Simpler Treating Physician/Extender: Frann Rider in Treatment: 11 Wound Status Wound Number: 1 Primary Diabetic Wound/Ulcer of the Lower Etiology: Extremity Wound Location: Foot - Plantar Wound Status: Open Wounding Event: Gradually Appeared Comorbid Hypertension, Type II Diabetes, Date Acquired: 08/03/2015 History: Gout Weeks Of Treatment: 11 Clustered  Wound: No Photos Photo Uploaded By: Regan Lemming on 02/29/2016 16:39:59 Wound Measurements Length: (cm) 0.9 Width: (cm) 0.5 Depth: (cm) 0.5 Area: (cm) 0.353 Volume: (cm) 0.177 % Reduction in Area: 78.6% % Reduction in Volume: 73.2% Epithelialization: None Tunneling: No Undermining: Yes Starting Position (o'clock): 10 Ending Position (o'clock): 1 Maximum Distance: (cm) 0.5 Wound Description Classification: Grade 1 Wound Margin: Distinct, outline attached Exudate Amount: Small Exudate Type: Serosanguineous Exudate Color: red, brown Foul Odor After Cleansing: No Wound Bed Byus, Terrius A. ([id number]) Granulation Amount: Large (67-100%) Exposed Structure Granulation Quality: Pink,  Pale Fascia Exposed: No Necrotic Amount: None Present (0%) Fat Layer Exposed: No Tendon Exposed: No Muscle Exposed: No Joint Exposed: No Bone Exposed: No Limited to Skin Breakdown Periwound Skin Texture Texture Color No Abnormalities Noted: No No Abnormalities Noted: No Callus: No Atrophie Blanche: No Crepitus: No Cyanosis: No Excoriation: No Ecchymosis: No Fluctuance: No Erythema: No Friable: No Hemosiderin Staining: No Induration: No Mottled: No Localized Edema: No Pallor: No Rash: No Rubor: No Scarring: No Temperature / Pain Moisture Temperature: No Abnormality No Abnormalities Noted: No Dry / Scaly: No Maceration: No Moist: Yes Wound Preparation Ulcer Cleansing: Rinsed/Irrigated with Saline Topical Anesthetic Applied: Other: lidocaine 4%, Treatment Notes Wound #1 (Plantar Foot) 1. Cleansed with: Clean wound with Normal Saline 4. Dressing Applied: Prisma Ag 5. Secondary Dressing Applied Gauze and Kerlix/Conform 6. Footwear/Offloading device applied Felt/Foam Other footwear/offloading device applied (specify in notes) 7. Secured with Tape Notes [place], EDDI TATAR ([id number]) felt/front offloader darco Pharmacist, community Signature(s) Signed: 02/29/2016  4:35:40 PM By: Montey Hora Entered By: Montey Hora on 02/29/2016 13:28:11 Dupras, [name] ([id number]) -------------------------------------------------------------------------------- [place] Details Patient Name: Range, Broc A. Date of Service: 02/29/2016 1:30 PM Medical Record Number: [id number] Patient Account Number: [id number] Date of Birth/Sex: 08/02/1955 (60 y.o. Male) Treating RN: Montey Hora Primary Care Physician: Viviana Simpler Other Clinician: Referring Physician: Viviana Simpler Treating Physician/Extender: Frann Rider in Treatment: 11 Vital Signs Time Taken: 13:17 Temperature (F): 98.3 Height (in): 72 Pulse (bpm): 89 Weight (lbs): 223 Respiratory Rate (breaths/min): 16 Body Mass Index (BMI): 30.2 Blood Pressure (mmHg): 141/71 Reference Range: 80 - 120 mg / dl Electronic Signature(s) Signed: 02/29/2016 4:35:40 PM By: Montey Hora Entered By: Montey Hora on 02/29/2016 13:19:31

## 2016-03-07 ENCOUNTER — Encounter: Payer: 59 | Admitting: Surgery

## 2016-03-07 DIAGNOSIS — E11621 Type 2 diabetes mellitus with foot ulcer: Secondary | ICD-10-CM | POA: Diagnosis not present

## 2016-03-07 NOTE — Progress Notes (Addendum)
[name], [name] ([id number]) Visit Report for 03/07/2016 Chief Complaint Document Details Patient Name: [name], [name] Date of Service: 03/07/2016 8:00 AM Medical Record Number: [id number] Patient Account Number: [id number] Date of Birth/Sex: [birth year] (60 y.o. Male) Treating RN: Montey Hora Primary Care Physician: Viviana Simpler Other Clinician: Referring Physician: Viviana Simpler Treating Physician/Extender: Frann Rider in Treatment: 12 Information Obtained from: Patient Chief Complaint 12/14/15; this is a 60 year old man who is a type II diabetic with diabetic neuropathy on insulin and metformin. Presents with a nonhealing ulcer on his right foot since January of this year Electronic Signature(s) Signed: 03/07/2016 8:33:30 AM By: Christin Fudge MD, FACS Entered By: Christin Fudge on 03/07/2016 08:33:29 Hooton, Gerda Diss ([id number]) -------------------------------------------------------------------------------- Debridement Details Patient Name: [name], [name] Date of Service: 03/07/2016 8:00 AM Medical Record Number: [id number] Patient Account Number: [id number] Date of Birth/Sex: 06-Apr-1956 (60 y.o. Male) Treating RN: Montey Hora Primary Care Physician: Viviana Simpler Other Clinician: Referring Physician: Viviana Simpler Treating Physician/Extender: Frann Rider in Treatment: 12 Debridement Performed for Wound #1 Plantar Foot Assessment: Performed By: Physician Christin Fudge, MD Debridement: Debridement Pre-procedure Yes Verification/Time Out Taken: Start Time: 08:27 Pain Control: Lidocaine 4% Topical Solution Level: Skin/Subcutaneous Tissue Total Area Debrided (L x 0.8 (cm) x 0.5 (cm) = 0.4 (cm) W): Tissue and other Viable, Non-Viable, Callus, Fibrin/Slough, Subcutaneous material debrided: Instrument: Curette Bleeding: Minimum Hemostasis Achieved: Pressure End Time: 08:30 Procedural Pain: 0 Post Procedural Pain: 0 Response to Treatment:  Procedure was tolerated well Post Debridement Measurements of Total Wound Length: (cm) 0.8 Width: (cm) 0.5 Depth: (cm) 0.5 Volume: (cm) 0.157 Post Procedure Diagnosis Same as Pre-procedure Electronic Signature(s) Signed: 03/07/2016 8:33:20 AM By: Christin Fudge MD, FACS Signed: 03/07/2016 1:24:09 PM By: Montey Hora Entered By: Christin Fudge on 03/07/2016 08:33:20 Longfield, Gerda Diss ([id number]) -------------------------------------------------------------------------------- HPI Details Patient Name: [name], [name] Date of Service: 03/07/2016 8:00 AM Medical Record Number: [id number] Patient Account Number: [id number] Date of Birth/Sex: 1956-07-17 (60 y.o. Male) Treating RN: Montey Hora Primary Care Physician: Viviana Simpler Other Clinician: Referring Physician: Viviana Simpler Treating Physician/Extender: Frann Rider in Treatment: 12 History of Present Illness HPI Description: 12/14/15; this is a 60 year old type II diabetic on insulin with diabetic polyneuropathy. He apparently had a callus at roughly his fourth metatarsal head in January he was attempting to remove however he ended up with a ulcer. His wife who was present said they have been using saline to clean this wound and using Polysporin and Betadine- but not making much progress. His previous surgery by his podiatrist Dr. Milinda Pointer in 2014 but does not wish to go back to see Dr. Milinda Pointer and was referred here instead. He has not been on any antibiotics and has not had recent x-rays of the foot. He has no known polyneuropathy and no prior wound history looking [facility] it appears that the patient had foot corrective surgery on 04/02/13 this consisted of an [facility] bunion repair on the right second metatarsal, osteotomy of the right fifth metatarsal, osteotomy of the right hammertoe repair on #2 #3 in #5. Sometime after this he injured the foot he was noted to have fractures with fracture of the first  metatarsal osteotomy fracture of the second metatarsal osteotomy dislocation of the third metatarsal phalangeal joint and fracture of the fifth metatarsal osteotomy site here and I believe he went back to the operating room and had internal fixation of the right foot. He has not had any recent x-rays of the right foot. He  had an MRI of the right foot in 2014 last hemoglobin A1c I see is at 7.1 on 10/29/15. He had a normal serum albumin on 04/29/15 of 4.8. He does not have any nutritional issues 12/21/15; x-ray of the foot that showed no acute bony abnormality no radiopaque foreign bodies. Postsurgical changes in the right first metatarsal were stable healing of fractures in the distal second metatarsal, old healed right fifth metatarsal fracture cortical erosions noted in the right third and fourth metatarsals. We have been using Prisma 12/28/15 the area on his right foot at roughly the fourth metatarsal head is unchanged. Still some circumferential callus and nonviable subcutaneous tissue which requires debridement. No obvious infection 01/04/16; wound is unchanged. Notable for less circumferential callus. Still non-viable subcutaneous tissue which requires debridement. No obvious infection. The patient did not come prepared for a total contact cast we will put this on next week 01/12/16; dimensions of the wound unchanged. The patient did not want a total contact cast today but is prepared to talk about this in 2-3 weeks if there is no progression towards resolution. 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 01/18/16; I don't see much change in this wound which is on the right fourth metatarsal head. I have not been able to talk him into a total contact cast. He continues to offload this with the [employer] forefoot offloading boot, scooter at work etc. 02/01/16:no changes. no significant improvement with wound. no reported systemic s/s of infection. 02/22/16 right  fourth metatarsal head. Still inferiorly deep wound. Debridement of circumferential callus nonviable subcutaneous tissue and surface slough. This apparently measures slightly smaller although not a major change since the last time I saw him. I have once again brought up the issue of a total contact cast. He works at [employer] 02/29/2016 -- was to have a TCC applied today but due to social reasons he has opted not to get it done this week and will agree to get it done next week. Taborn, YONEL MALDEN ([id number]) Electronic Signature(s) Signed: 03/07/2016 8:33:44 AM By: Christin Fudge MD, FACS Previous Signature: 03/07/2016 8:16:23 AM Version By: Christin Fudge MD, FACS Entered By: Christin Fudge on 03/07/2016 08:33:44 Derosa, Gerda Diss ([id number]) -------------------------------------------------------------------------------- Physical Exam Details Patient Name: Linsley, Filomeno A. Date of Service: 03/07/2016 8:00 AM Medical Record Number: [id number] Patient Account Number: [id number] Date of Birth/Sex: 09-08-1955 (60 y.o. Male) Treating RN: Montey Hora Primary Care Physician: Viviana Simpler Other Clinician: Referring Physician: Viviana Simpler Treating Physician/Extender: Frann Rider in Treatment: 12 Constitutional . Pulse regular. Respirations normal and unlabored. Afebrile. . Eyes Nonicteric. Reactive to light. Ears, Nose, Mouth, and Throat Lips, teeth, and gums WNL.Marland Kitchen Moist mucosa without lesions. Neck supple and nontender. No palpable supraclavicular or cervical adenopathy. Normal sized without goiter. Respiratory WNL. No retractions.. Breath sounds WNL, No rubs, rales, rhonchi, or wheeze.. Cardiovascular Heart rhythm and rate regular, no murmur or gallop.. Pedal Pulses WNL. No clubbing, cyanosis or edema. Lymphatic No adneopathy. No adenopathy. No adenopathy. Musculoskeletal Adexa without tenderness or enlargement.. Digits and nails w/o clubbing, cyanosis, infection,  petechiae, ischemia, or inflammatory conditions.. Integumentary (Hair, Skin) No suspicious lesions. No crepitus or fluctuance. No peri-wound warmth or erythema. No masses.Marland Kitchen Psychiatric Judgement and insight Intact.. No evidence of depression, anxiety, or agitation.. Notes continues to be some callus and using a #3 curet I have removed all the surrounding callus and the overhanging edges and saucerized the wound. It does not probe down to bone.  Brisk bleeding controlled with pressure. Electronic Signature(s) Signed: 03/07/2016 8:34:24 AM By: Christin Fudge MD, FACS Entered By: Christin Fudge on 03/07/2016 08:34:24 Pecore, Gerda Diss ([id number]) -------------------------------------------------------------------------------- Physician Orders Details Patient Name: Bernat, Kymir A. Date of Service: 03/07/2016 8:00 AM Medical Record Number: [id number] Patient Account Number: [id number] Date of Birth/Sex: 11/28/1955 (60 y.o. Male) Treating RN: Montey Hora Primary Care Physician: Viviana Simpler Other Clinician: Referring Physician: Viviana Simpler Treating Physician/Extender: Frann Rider in Treatment: 12 Verbal / Phone Orders: Yes Clinician: Montey Hora Read Back and Verified: Yes Diagnosis Coding Wound Cleansing Wound #1 Plantar Foot o Cleanse wound with mild soap and water o May Shower, gently pat wound dry prior to applying new dressing. o May shower with protection. Primary Wound Dressing Wound #1 Plantar Foot o Prisma Ag Secondary Dressing Wound #1 Plantar Foot o Foam Dressing Change Frequency Wound #1 Plantar Foot o Other: - Thursday and next Tuesday Follow-up Appointments Wound #1 Plantar Foot o Other: - Thursday and next Tuesday Off-Loading Wound #1 Plantar Foot o Total Contact Cast to Right Lower Extremity o Other: - felt Additional Orders / Instructions Wound #1 Plantar Foot o Increase protein intake. o Activity as  tolerated Electronic Signature(s) Signed: 03/07/2016 12:12:38 PM By: Christin Fudge MD, FACS Schwalb, JALYN PLUMBER ([id number]) Signed: 03/07/2016 1:24:09 PM By: Montey Hora Entered By: Montey Hora on 03/07/2016 08:33:13 Christoffel, Gerda Diss ([id number]) -------------------------------------------------------------------------------- Problem List Details Patient Name: Mikrut, Lena A. Date of Service: 03/07/2016 8:00 AM Medical Record Number: [id number] Patient Account Number: [id number] Date of Birth/Sex: 1956-06-15 (60 y.o. 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 Date of Service: 03/07/2016 8:00 AM Medical Record Number: [id number] Patient Account Number: [id number] Date of Birth/Sex: 1956-01-03 (60 y.o. Male) Treating RN: Montey Hora Primary Care Physician: Viviana Simpler Other Clinician: Referring Physician: Viviana Simpler Treating Physician/Extender: Frann Rider in Treatment: 12 Subjective Chief Complaint Information obtained from Patient 12/14/15; this is a 60 year old man who is a type II diabetic with diabetic neuropathy on insulin and metformin. Presents with a nonhealing ulcer on his  right foot since January of this year History of Present Illness (HPI) 12/14/15; this is a 60 year old type II diabetic on insulin with diabetic polyneuropathy. He apparently had a callus at roughly his fourth metatarsal head in January he was attempting to remove however he ended up with a ulcer. His wife who was present said they have been using saline to clean this wound and using Polysporin and Betadine- but not making much progress. His previous surgery by his podiatrist Dr. Milinda Pointer in 2014 but does not wish to go back to see Dr. Milinda Pointer and was referred here instead. He has not been on any antibiotics and has not had recent x-rays of the foot. He has no known polyneuropathy and no prior wound history looking [facility] it appears that the patient had foot corrective surgery on 04/02/13 this consisted of an [facility] on  the right second metatarsal, osteotomy of the right fifth metatarsal, osteotomy of the right hammertoe repair on #2 #3 in #5. Sometime after this he injured the foot he was noted to have fractures with fracture of the first metatarsal osteotomy fracture of the second metatarsal osteotomy dislocation of the third metatarsal phalangeal joint and fracture of the fifth metatarsal osteotomy site here and I believe he went back to the operating room and had internal fixation of the right foot. He has not had any recent x-rays of the right foot. He had an MRI of the right foot in 2014 last hemoglobin A1c I see is at 7.1 on 10/29/15. He had a normal serum albumin on 04/29/15 of 4.8. He does not have any nutritional issues 12/21/15; x-ray of the foot that showed no acute bony abnormality no radiopaque foreign bodies. Postsurgical changes in the right first metatarsal were stable healing of fractures in the distal second metatarsal, old healed right fifth metatarsal fracture cortical erosions noted in the right third and fourth metatarsals. We have been using  Prisma 12/28/15 the area on his right foot at roughly the fourth metatarsal head is unchanged. Still some circumferential callus and nonviable subcutaneous tissue which requires debridement. No obvious infection 01/04/16; wound is unchanged. Notable for less circumferential callus. Still non-viable subcutaneous tissue which requires debridement. No obvious infection. The patient did not come prepared for a total contact cast we will put this on next week 01/12/16; dimensions of the wound unchanged. The patient did not want a total contact cast today but is prepared to talk about this in 2-3 weeks if there is no progression towards resolution. 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 01/18/16; I don't see much change in this wound which is on the right fourth metatarsal head. I have not been able to talk him into a total contact cast. He continues to offload this with the [employer] forefoot offloading boot, scooter at work etc. Dube, [place] A. ([id number]) 02/01/16:no changes. no significant improvement with wound. no reported systemic s/s of infection. 02/22/16 right fourth metatarsal head. Still inferiorly deep wound. Debridement of circumferential callus nonviable subcutaneous tissue and surface slough. This apparently measures slightly smaller although not a major change since the last time I saw him. I have once again brought up the issue of a total contact cast. He works at [employer] 02/29/2016 -- was to have a TCC applied today but due to social reasons he has opted not to get it done this week and will agree to get it done next week. Objective Constitutional Pulse regular. Respirations normal and unlabored. Afebrile. Vitals Time Taken: 8:09 AM, Height: 72 in, Weight: 223 lbs, BMI: 30.2, Temperature: 98.2 F, Pulse: 86 bpm, Respiratory Rate: 18 breaths/min, Blood Pressure: 151/83 mmHg. Eyes Nonicteric. Reactive to light. Ears, Nose, Mouth, and  Throat Lips, teeth, and gums WNL.Marland Kitchen Moist mucosa without lesions. Neck supple and nontender. No palpable supraclavicular or cervical adenopathy. Normal sized without goiter. Respiratory WNL. No retractions.. Breath sounds WNL, No rubs, rales, rhonchi, or wheeze.. Cardiovascular Heart rhythm and rate regular, no murmur or gallop.. Pedal Pulses WNL. No clubbing, cyanosis or edema. Lymphatic No adneopathy. No adenopathy. No adenopathy. Musculoskeletal Adexa without tenderness or enlargement.. Digits and nails w/o clubbing, cyanosis, infection, petechiae, ischemia, or inflammatory conditions.Marland Kitchen Psychiatric Judgement and insight Intact.. No evidence of depression, anxiety, or agitation.. General Notes: continues to be some callus and using a #3 curet I have removed all the  surrounding callus and the overhanging edges and saucerized the wound. It does not probe down to bone. Brisk bleeding Kipp, Oskar A. ([id number]) controlled with pressure. Integumentary (Hair, Skin) No suspicious lesions. No crepitus or fluctuance. No peri-wound warmth or erythema. No masses.. Wound #1 status is Open. Original cause of wound was Gradually Appeared. The wound is located on the Plantar Foot. The wound measures 0.8cm length x 0.5cm width x 0.4cm depth; 0.314cm^2 area and 0.126cm^3 volume. The wound is limited to skin breakdown. There is no tunneling or undermining noted. There is a small amount of serosanguineous drainage noted. The wound margin is distinct with the outline attached to the wound base. There is large (67-100%) pink, pale granulation within the wound bed. There is no necrotic tissue within the wound bed. The periwound skin appearance exhibited: Callus, Moist. The periwound skin appearance did not exhibit: Crepitus, Excoriation, Fluctuance, Friable, Induration, Localized Edema, Rash, Scarring, Dry/Scaly, Maceration, Atrophie Blanche, Cyanosis, Ecchymosis, Hemosiderin Staining, Mottled, Pallor,  Rubor, Erythema. Periwound temperature was noted as No Abnormality. Assessment Active Problems ICD-10 E11.621 - Type 2 diabetes mellitus with foot ulcer E11.42 - Type 2 diabetes mellitus with diabetic polyneuropathy L97.513 - Non-pressure chronic ulcer of other part of right foot with necrosis of muscle Procedures Wound #1 Wound #1 is a Diabetic Wound/Ulcer of the Lower Extremity located on the Plantar Foot . There was a Skin/Subcutaneous Tissue Debridement [id number]) debridement with total area of 0.4 sq cm performed by Christin Fudge, MD. with the following instrument(s): Curette to remove Viable and Non-Viable tissue/material including Fibrin/Slough, Callus, and Subcutaneous after achieving pain control using Lidocaine 4% Topical Solution. A time out was conducted prior to the start of the procedure. A Minimum amount of bleeding was controlled with Pressure. The procedure was tolerated well with a pain level of 0 throughout and a pain level of 0 following the procedure. Post Debridement Measurements: 0.8cm length x 0.5cm width x 0.5cm depth; 0.157cm^3 volume. Post procedure Diagnosis Wound #1: Same as Pre-Procedure Wound #1 is a Diabetic Wound/Ulcer of the Lower Extremity located on the Plantar Foot . There was a Total Contact Cast Procedure by Christin Fudge, MD. Post procedure Diagnosis Wound #1: Same as Pre-Procedure Mealey, Kester A. ([id number]) Plan Wound Cleansing: Wound #1 Plantar Foot: Cleanse wound with mild soap and water May Shower, gently pat wound dry prior to applying new dressing. May shower with protection. Primary Wound Dressing: Wound #1 Plantar Foot: Prisma Ag Secondary Dressing: Wound #1 Plantar Foot: Foam Dressing Change Frequency: Wound #1 Plantar Foot: Other: - Thursday and next Tuesday Follow-up Appointments: Wound #1 Plantar Foot: Other: - Thursday and next Tuesday Off-Loading: Wound #1 Plantar Foot: Total Contact Cast to Right Lower  Extremity Other: - felt Additional Orders / Instructions: Wound #1 Plantar Foot: Increase protein intake. Activity as tolerated We will use Prisma AG and appropriate offloading felt and he is willing to have his total contact cast applied today. He understands that he will return in 48 hours for a cast change. Electronic Signature(s) Signed: 03/07/2016 5:17:51 PM By: Christin Fudge MD, FACS Previous Signature: 03/07/2016 8:35:03 AM Version By: Christin Fudge MD, FACS Entered By: Christin Fudge on 03/07/2016 17:17:51 Locatelli, Gerda Diss ([id number]) Burgen, Gerda Diss ([id number]) -------------------------------------------------------------------------------- Total Contact Cast Details Patient Name: Murcia, Kairav A. Date of Service: 03/07/2016 8:00 AM Medical Record Number: [id number] Patient Account Number: [id number] Date of Birth/Sex: 1955-11-27 (60 y.o. Male) Treating RN: Montey Hora Primary Care Physician: Viviana Simpler Other Clinician: Referring Physician: Viviana Simpler Treating Physician/Extender:  Jonasia Coiner Weeks in Treatment: 12 Total Contact Cast Applied for Wound Wound #1 Plantar Foot Assessment: Performed By: Physician Christin Fudge, MD Post Procedure Diagnosis Same as Pre-procedure Electronic Signature(s) Signed: 03/07/2016 12:12:38 PM By: Christin Fudge MD, FACS Signed: 03/07/2016 1:24:09 PM By: Montey Hora Entered By: Montey Hora on 03/07/2016 09:01:30 Yarde, Gerda Diss ([id number]) -------------------------------------------------------------------------------- [place] Details Patient Name: Giammona, Brit A. Date of Service: 03/07/2016 Medical Record Number: [id number] Patient Account Number: [id number] Date of Birth/Sex: [birth year] (60 y.o. Male) Treating RN: Montey Hora Primary Care Physician: Viviana Simpler Other Clinician: Referring Physician: Viviana Simpler Treating Physician/Extender: Frann Rider in Treatment: 12 Diagnosis Coding ICD-10  Codes Code Description [phone] Type 2 diabetes mellitus with foot ulcer E11.42 Type 2 diabetes mellitus with diabetic polyneuropathy L97.513 Non-pressure chronic ulcer of other part of right foot with necrosis of muscle Facility Procedures CPT4: Description Modifier Quantity Code [id number] - DEB SUBQ TISSUE 20 SQ CM/< 1 ICD-10 Description Diagnosis E11.621 Type 2 diabetes mellitus with foot ulcer E11.42 Type 2 diabetes mellitus with diabetic polyneuropathy L97.513 Non-pressure chronic  ulcer of other part of right foot with necrosis of muscle Physician Procedures CPT4: Description Modifier Quantity Code [id number] - WC PHYS SUBQ TISS 20 SQ CM 1 ICD-10 Description Diagnosis E11.621 Type 2 diabetes mellitus with foot ulcer E11.42 Type 2 diabetes mellitus with diabetic polyneuropathy L97.513 Non-pressure chronic  ulcer of other part of right foot with necrosis of muscle Electronic Signature(s) Signed: 03/07/2016 8:35:16 AM By: Christin Fudge MD, FACS Entered By: Christin Fudge on 03/07/2016 08:35:16

## 2016-03-07 NOTE — Progress Notes (Signed)
[place], NAZARET NEUFER ([id number]) Visit Report for 03/07/2016 Arrival Information Details Patient Name: Gabbert, CHRISTOS A. Date of Service: 03/07/2016 8:00 AM Medical Record Number: [id number] Patient Account Number: [id number] Date of Birth/Sex: 1955-11-03 (60 y.o. Male) Treating RN: Montey Hora Primary Care Physician: Viviana Simpler Other Clinician: Referring Physician: Viviana Simpler Treating Physician/Extender: Frann Rider in Treatment: 12 Visit Information History Since Last Visit Added or deleted any medications: No Patient Arrived: Ambulatory Any new allergies or adverse reactions: No Arrival Time: 08:06 Had a fall or experienced change in No Accompanied By: self activities of daily living that may affect Transfer Assistance: None risk of falls: Patient Identification Verified: Yes Signs or symptoms of abuse/neglect since last No Secondary Verification Process Yes visito Completed: Hospitalized since last visit: No Patient Requires Transmission-Based No Pain Present Now: No Precautions: Patient Has Alerts: Yes Patient Alerts: HgbA1c 7.0 Electronic Signature(s) Signed: 03/07/2016 1:24:09 PM By: Montey Hora Entered By: Montey Hora on 03/07/2016 08:09:26 Babe, Gerda Diss ([id number]) -------------------------------------------------------------------------------- Encounter Discharge Information Details Patient Name: Wooding, Marwan A. Date of Service: 03/07/2016 8:00 AM Medical Record Number: [id number] Patient Account Number: [id number] Date of Birth/Sex: January 09, 1956 (60 y.o. Male) Treating RN: Montey Hora Primary Care Physician: Viviana Simpler Other Clinician: Referring Physician: Viviana Simpler Treating Physician/Extender: Frann Rider in Treatment: 12 Encounter Discharge Information Items Discharge Pain Level: 0 Discharge Condition: Stable Ambulatory Status: Ambulatory Discharge Destination: Home Transportation: Private Auto Accompanied By:  self Schedule Follow-up Appointment: Yes Medication Reconciliation completed and provided to Patient/Care No Tailer Volkert: Provided on Clinical Summary of Care: 03/07/2016 Form Type Recipient Paper Patient JW Electronic Signature(s) Signed: 03/07/2016 9:18:00 AM By: Ruthine Dose Entered By: Ruthine Dose on 03/07/2016 09:18:00 Funderburk, Gerda Diss ([id number]) -------------------------------------------------------------------------------- Multi Wound Chart Details Patient Name: Nuncio, Facundo A. Date of Service: 03/07/2016 8:00 AM Medical Record Number: [id number] Patient Account Number: [id number] Date of Birth/Sex: [birth year] (60 y.o. Male) Treating RN: Montey Hora Primary Care Physician: Viviana Simpler Other Clinician: Referring Physician: Viviana Simpler Treating Physician/Extender: Frann Rider in Treatment: 12 Vital Signs Height(in): 72 Pulse(bpm): 86 Weight(lbs): 223 Blood Pressure 151/83 (mmHg): Body Mass Index(BMI): 30 Temperature(F): 98.2 Respiratory Rate 18 (breaths/min): Photos: [N/A:N/A] Wound Location: Foot - Plantar N/A N/A Wounding Event: Gradually Appeared N/A N/A Primary Etiology: Diabetic Wound/Ulcer of N/A N/A the Lower Extremity Comorbid History: Hypertension, Type II N/A N/A Diabetes, Gout Date Acquired: 08/03/2015 N/A N/A Weeks of Treatment: 12 N/A N/A Wound Status: Open N/A N/A Measurements L x W x D 0.8x0.5x0.4 N/A N/A (cm) Area (cm) : 0.314 N/A N/A Volume (cm) : 0.126 N/A N/A % Reduction in Area: 81.00% N/A N/A % Reduction in Volume: 80.90% N/A N/A Classification: Grade 1 N/A N/A Exudate Amount: Small N/A N/A Exudate Type: Serosanguineous N/A N/A Exudate Color: red, brown N/A N/A Wound Margin: Distinct, outline attached N/A N/A Granulation Amount: Large (67-100%) N/A N/A Granulation Quality: Pink, Pale N/A N/A Necrotic Amount: None Present (0%) N/A N/A Brouillet, Marcelle A. ([id number]) Exposed Structures: Fascia: No N/A N/A Fat:  No Tendon: No Muscle: No Joint: No Bone: No Limited to Skin Breakdown Epithelialization: None N/A N/A Periwound Skin Texture: Callus: Yes N/A N/A Edema: No Excoriation: No Induration: No Crepitus: No Fluctuance: No Friable: No Rash: No Scarring: No Periwound Skin Moist: Yes N/A N/A Moisture: Maceration: No Dry/Scaly: No Periwound Skin Color: Atrophie Blanche: No N/A N/A Cyanosis: No Ecchymosis: No Erythema: No Hemosiderin Staining: No Mottled: No Pallor: No Rubor: No Temperature: No Abnormality N/A N/A Tenderness on No N/A N/A Palpation: Wound  Preparation: Ulcer Cleansing: N/A N/A Rinsed/Irrigated with Saline Topical Anesthetic Applied: Other: lidocaine 4% Treatment Notes Electronic Signature(s) Signed: 03/07/2016 1:24:09 PM By: Montey Hora Entered By: Montey Hora on 03/07/2016 08:27:32 Shek, Gerda Diss ([id number]) -------------------------------------------------------------------------------- [place] Details Patient Name: Moon, Broughton A. Date of Service: 03/07/2016 8:00 AM Medical Record Number: [id number] Patient Account Number: [id number] Date of Birth/Sex: 29-Mar-1956 (60 y.o. Male) Treating RN: Montey Hora Primary Care Physician: Viviana Simpler Other Clinician: Referring Physician: Viviana Simpler Treating Physician/Extender: Frann Rider in Treatment: 12 Active Inactive Abuse / Safety / Falls / Self Care Management Nursing Diagnoses: Impaired home maintenance Impaired physical mobility Knowledge deficit related to abuse or neglect Knowledge deficit related to: safety; personal, health (wound), emergency Potential for falls Goals: Patient will remain injury free Date Initiated: 12/14/2015 Goal Status: Active Patient/caregiver will verbalize understanding of skin care regimen Date Initiated: 12/14/2015 Goal Status: Active Patient/caregiver will verbalize/demonstrate measure taken to improve self care Date  Initiated: 12/14/2015 Goal Status: Active Patient/caregiver will verbalize/demonstrate measures taken to improve the patient's personal safety Date Initiated: 12/14/2015 Goal Status: Active Patient/caregiver will verbalize/demonstrate measures taken to prevent injury and/or falls Date Initiated: 12/14/2015 Goal Status: Active Patient/caregiver will verbalize/demonstrate understanding of what to do in case of emergency Date Initiated: 12/14/2015 Goal Status: Active Interventions: Assess fall risk on admission and as needed Assess: immobility, friction, shearing, incontinence upon admission and as needed Assess impairment of mobility on admission and as needed per policy Assess self care needs on admission and as needed Patient referred to community resources (specify in notes) Mcnelly, KAILOR HOLSONBACK. ([id number]) Provide education on basic hygiene Provide education on personal and home safety Provide education on safe transfers Provide education on vaccinations Treatment Activities: Education provided on Basic Hygiene : 01/12/2016 Notes: Orientation to the Wound Care Program Nursing Diagnoses: Knowledge deficit related to the wound healing center program Goals: Patient/caregiver will verbalize understanding of the [facility] Program Date Initiated: 12/14/2015 Goal Status: Active Interventions: Provide education on orientation to the wound center Notes: Peripheral Neuropathy Nursing Diagnoses: Knowledge deficit related to disease process and management of peripheral neurovascular dysfunction Potential alteration in peripheral tissue perfusion (select prior to confirmation of diagnosis) Goals: Patient/caregiver will verbalize understanding of disease process and disease management Date Initiated: 12/14/2015 Goal Status: Active Interventions: Assess signs and symptoms of neuropathy upon admission and as needed Provide education on Management of Neuropathy and Related  Ulcers Provide education on Management of Neuropathy upon discharge from the [facility] Treatment Activities: Patient referred for customized footwear/offloading : 12/14/2015 Notes: Wound/Skin Impairment Dauria, AKARI KONKEL ([id number]) Nursing Diagnoses: Impaired tissue integrity Knowledge deficit related to ulceration/compromised skin integrity Goals: Patient/caregiver will verbalize understanding of skin care regimen Date Initiated: 12/14/2015 Goal Status: Active Ulcer/skin breakdown will have a volume reduction of 30% by week 4 Date Initiated: 12/14/2015 Goal Status: Active Ulcer/skin breakdown will have a volume reduction of 50% by week 8 Date Initiated: 12/14/2015 Goal Status: Active Ulcer/skin breakdown will have a volume reduction of 80% by week 12 Date Initiated: 12/14/2015 Goal Status: Active Ulcer/skin breakdown will heal within 14 weeks Date Initiated: 12/14/2015 Goal Status: Active Interventions: Assess patient/caregiver ability to obtain necessary supplies Assess patient/caregiver ability to perform ulcer/skin care regimen upon admission and as needed Assess ulceration(s) every visit Provide education on ulcer and skin care Treatment Activities: Referred to DME Leavy Heatherly for dressing supplies : 12/14/2015 Skin care regimen initiated : 12/14/2015 Topical wound management initiated : 12/14/2015 Notes: Electronic Signature(s) Signed: 03/07/2016 1:24:09 PM By: Marjory Lies,  Di Kindle Entered By: Montey Hora on 03/07/2016 [place] ([id number]) -------------------------------------------------------------------------------- Pain Assessment Details Patient Name: Correll, Rena A. Date of Service: 03/07/2016 8:00 AM Medical Record Number: [id number] Patient Account Number: [id number] Date of Birth/Sex: [birth year] (60 y.o. Male) Treating RN: Montey Hora Primary Care Physician: Viviana Simpler Other Clinician: Referring Physician: Viviana Simpler Treating  Physician/Extender: Frann Rider in Treatment: 12 Active Problems Location of Pain Severity and Description of Pain Patient Has Paino No Site Locations Pain Management and Medication Current Pain Management: Notes Topical or injectable lidocaine is offered to patient for acute pain when surgical debridement is performed. If needed, Patient is instructed to use over the counter pain medication for the following 24-48 hours after debridement. Wound care MDs do not prescribed pain medications. Patient has chronic pain or uncontrolled pain. Patient has been instructed to make an appointment with their Primary Care Physician for pain management. Electronic Signature(s) Signed: 03/07/2016 1:24:09 PM By: Montey Hora Entered By: Montey Hora on 03/07/2016 08:09:41 Deleonardis, Gerda Diss ([id number]) -------------------------------------------------------------------------------- Patient/Caregiver Education Details Patient Name: Fuente, Duglas A. Date of Service: 03/07/2016 8:00 AM Medical Record Number: [id number] Patient Account Number: [id number] Date of Birth/Gender: July 30, 1955 (60 y.o. Male) Treating RN: Montey Hora Primary Care Physician: Viviana Simpler Other Clinician: Referring Physician: Viviana Simpler Treating Physician/Extender: Frann Rider in Treatment: 12 Education Assessment Education Provided To: Patient Education Topics Provided Safety: Handouts: Other: TCC precautions Methods: Explain/Verbal, Printed Responses: State content correctly Electronic Signature(s) Signed: 03/07/2016 1:24:09 PM By: Montey Hora Entered By: Montey Hora on 03/07/2016 09:03:19 Camposano, Gerda Diss ([id number]) -------------------------------------------------------------------------------- Wound Assessment Details Patient Name: Carneal, Zaveon A. Date of Service: 03/07/2016 8:00 AM Medical Record Number: [id number] Patient Account Number: [id number] Date of Birth/Sex: 07-04-1956 (60  y.o. Male) Treating RN: Montey Hora Primary Care Physician: Viviana Simpler Other Clinician: Referring Physician: Viviana Simpler Treating Physician/Extender: Frann Rider in Treatment: 12 Wound Status Wound Number: 1 Primary Diabetic Wound/Ulcer of the Lower Etiology: Extremity Wound Location: Foot - Plantar Wound Status: Open Wounding Event: Gradually Appeared Comorbid Hypertension, Type II Diabetes, Date Acquired: 08/03/2015 History: Gout Weeks Of Treatment: 12 Clustered Wound: No Photos Wound Measurements Length: (cm) 0.8 Width: (cm) 0.5 Depth: (cm) 0.4 Area: (cm) 0.314 Volume: (cm) 0.126 % Reduction in Area: 81% % Reduction in Volume: 80.9% Epithelialization: None Tunneling: No Undermining: No Wound Description Classification: Grade 1 Wound Margin: Distinct, outline attached Exudate Amount: Small Exudate Type: Serosanguineous Exudate Color: red, brown Foul Odor After Cleansing: No Wound Bed Granulation Amount: Large (67-100%) Exposed Structure Granulation Quality: Pink, Pale Fascia Exposed: No Necrotic Amount: None Present (0%) Fat Layer Exposed: No Tendon Exposed: No Muscle Exposed: No Brashier, Fuquan A. ([id number]) Joint Exposed: No Bone Exposed: No Limited to Skin Breakdown Periwound Skin Texture Texture Color No Abnormalities Noted: No No Abnormalities Noted: No Callus: Yes Atrophie Blanche: No Crepitus: No Cyanosis: No Excoriation: No Ecchymosis: No Fluctuance: No Erythema: No Friable: No Hemosiderin Staining: No Induration: No Mottled: No Localized Edema: No Pallor: No Rash: No Rubor: No Scarring: No Temperature / Pain Moisture Temperature: No Abnormality No Abnormalities Noted: No Dry / Scaly: No Maceration: No Moist: Yes Wound Preparation Ulcer Cleansing: Rinsed/Irrigated with Saline Topical Anesthetic Applied: Other: lidocaine 4%, Treatment Notes Wound #1 (Plantar Foot) 1. Cleansed with: Clean wound with Normal  Saline 2. Anesthetic Topical Lidocaine 4% cream to wound bed prior to debridement 4. Dressing Applied: Prisma Ag 5. Secondary Dressing Applied Foam 6. Footwear/Offloading device applied Other footwear/offloading device applied (specify in notes) Notes  TCC applied by Dr Con Memos today Electronic Signature(s) Signed: 03/07/2016 1:24:09 PM By: Montey Hora Entered By: Montey Hora on 03/07/2016 08:15:22 Schild, Gerda Diss ([id number]) -------------------------------------------------------------------------------- [place] Details Patient Name: Flores, Russell A. Date of Service: 03/07/2016 8:00 AM Medical Record Number: [id number] Patient Account Number: [id number] Date of Birth/Sex: 11/06/1955 (60 y.o. Male) Treating RN: Montey Hora Primary Care Physician: Viviana Simpler Other Clinician: Referring Physician: Viviana Simpler Treating Physician/Extender: Frann Rider in Treatment: 12 Vital Signs Time Taken: 08:09 Temperature (F): 98.2 Height (in): 72 Pulse (bpm): 86 Weight (lbs): 223 Respiratory Rate (breaths/min): 18 Body Mass Index (BMI): 30.2 Blood Pressure (mmHg): 151/83 Reference Range: 80 - 120 mg / dl Electronic Signature(s) Signed: 03/07/2016 1:24:09 PM By: Montey Hora Entered By: Montey Hora on 03/07/2016 08:10:00

## 2016-03-09 ENCOUNTER — Encounter (HOSPITAL_BASED_OUTPATIENT_CLINIC_OR_DEPARTMENT_OTHER): Payer: 59 | Admitting: General Surgery

## 2016-03-09 ENCOUNTER — Encounter: Payer: Self-pay | Admitting: General Surgery

## 2016-03-09 DIAGNOSIS — E08621 Diabetes mellitus due to underlying condition with foot ulcer: Secondary | ICD-10-CM

## 2016-03-09 DIAGNOSIS — E1142 Type 2 diabetes mellitus with diabetic polyneuropathy: Secondary | ICD-10-CM | POA: Diagnosis not present

## 2016-03-09 DIAGNOSIS — E11621 Type 2 diabetes mellitus with foot ulcer: Secondary | ICD-10-CM

## 2016-03-09 DIAGNOSIS — L97519 Non-pressure chronic ulcer of other part of right foot with unspecified severity: Principal | ICD-10-CM

## 2016-03-09 NOTE — Progress Notes (Signed)
[email or web address] 

## 2016-03-10 NOTE — Progress Notes (Addendum)
[name] ([id number]) Visit Report for 03/09/2016 Chief Complaint Document Details Patient Name: [name], [name] Date of Service: 03/09/2016 4:00 PM Medical Record Number: [id number] Patient Account Number: [id number] Date of Birth/Sex: Apr 18, 1956 (60 y.o. Male) Treating RN: Ahmed Prima Primary Care Physician: Viviana Simpler Other Clinician: Referring Physician: Viviana Simpler Treating Physician/Extender: Benjaman Pott in Treatment: 12 Information Obtained from: Patient Chief Complaint 12/14/15; this is a 60 year old man who is a type II diabetic with diabetic neuropathy on insulin and metformin. Presents with a nonhealing ulcer on his right foot since January of this year Electronic Signature(s) Signed: 03/09/2016 4:53:32 PM By: Judene Companion MD Previous Signature: 03/09/2016 3:56:45 PM Version By: Judene Companion MD Entered By: Judene Companion on 03/09/2016 16:53:32 [name] ([id number]) -------------------------------------------------------------------------------- Debridement Details Patient Name: [name], [name] Date of Service: 03/09/2016 4:00 PM Medical Record Number: [id number] Patient Account Number: [id number] Date of Birth/Sex: Apr 25, 1956 (60 y.o. Male) Treating RN: Montey Hora Primary Care Physician: Viviana Simpler Other Clinician: Referring Physician: Viviana Simpler Treating Physician/Extender: Benjaman Pott in Treatment: 12 Debridement Performed for Wound #1 Plantar Foot Assessment: Performed By: Physician Judene Companion, MD Debridement: Debridement Pre-procedure Yes Verification/Time Out Taken: Start Time: 16:30 Pain Control: Lidocaine 4% Topical Solution Level: Skin/Subcutaneous Tissue Total Area Debrided (L x 0.2 (cm) x 0.1 (cm) = 0.02 (cm) W): Tissue and other Viable, Non-Viable, Callus, Fibrin/Slough material debrided: Instrument: Scissors Bleeding: None End Time: 16:32 Procedural Pain: 0 Post Procedural Pain: 0 Response  to Treatment: Procedure was tolerated well Post Debridement Measurements of Total Wound Length: (cm) 0.1 Width: (cm) 0.2 Depth: (cm) 0.2 Volume: (cm) 0.003 Post Procedure Diagnosis Same as Pre-procedure Electronic Signature(s) Signed: 03/09/2016 4:58:15 PM By: Montey Hora Signed: 03/10/2016 8:09:12 AM By: Judene Companion MD Entered By: Montey Hora on 03/09/2016 16:32:02 [name] ([id number]) -------------------------------------------------------------------------------- HPI Details Patient Name: [name], [name] Date of Service: 03/09/2016 4:00 PM Medical Record Number: [id number] Patient Account Number: [id number] Date of Birth/Sex: 12-14-1955 (60 y.o. Male) Treating RN: Ahmed Prima Primary Care Physician: Viviana Simpler Other Clinician: Referring Physician: Viviana Simpler Treating Physician/Extender: Benjaman Pott in Treatment: 12 History of Present Illness HPI Description: 12/14/15; this is a 60 year old type II diabetic on insulin with diabetic polyneuropathy. He apparently had a callus at roughly his fourth metatarsal head in January he was attempting to remove however he ended up with a ulcer. His wife who was present said they have been using saline to clean this wound and using Polysporin and Betadine- but not making much progress. His previous surgery by his podiatrist Dr. Milinda Pointer in 2014 but does not wish to go back to see Dr. Milinda Pointer and was referred here instead. He has not been on any antibiotics and has not had recent x-rays of the foot. He has no known polyneuropathy and no prior wound history looking [facility] it appears that the patient had foot corrective surgery on 04/02/13 this consisted of an [facility] bunion repair on the right second metatarsal, osteotomy of the right fifth metatarsal, osteotomy of the right hammertoe repair on #2 #3 in #5. Sometime after this he injured the foot he was noted to have fractures with fracture of  the first metatarsal osteotomy fracture of the second metatarsal osteotomy dislocation of the third metatarsal phalangeal joint and fracture of the fifth metatarsal osteotomy site here and I believe he went back to the operating room and had internal fixation of the right foot. He has not had any recent x-rays of  the right foot. He had an MRI of the right foot in 2014 last hemoglobin A1c I see is at 7.1 on 10/29/15. He had a normal serum albumin on 04/29/15 of 4.8. He does not have any nutritional issues 12/21/15; x-ray of the foot that showed no acute bony abnormality no radiopaque foreign bodies. Postsurgical changes in the right first metatarsal were stable healing of fractures in the distal second metatarsal, old healed right fifth metatarsal fracture cortical erosions noted in the right third and fourth metatarsals. We have been using Prisma 12/28/15 the area on his right foot at roughly the fourth metatarsal head is unchanged. Still some circumferential callus and nonviable subcutaneous tissue which requires debridement. No obvious infection 01/04/16; wound is unchanged. Notable for less circumferential callus. Still non-viable subcutaneous tissue which requires debridement. No obvious infection. The patient did not come prepared for a total contact cast we will put this on next week 01/12/16; dimensions of the wound unchanged. The patient did not want a total contact cast today but is prepared to talk about this in 2-3 weeks if there is no progression towards resolution. 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 01/18/16; I don't see much change in this wound which is on the right fourth metatarsal head. I have not been able to talk him into a total contact cast. He continues to offload this with the Darco forefoot offloading boot, scooter at work etc. 02/01/16:no changes. no significant improvement with wound. no reported systemic s/s of infection. 02/22/16  right fourth metatarsal head. Still inferiorly deep wound. Debridement of circumferential callus nonviable subcutaneous tissue and surface slough. This apparently measures slightly smaller although not a major change since the last time I saw him. I have once again brought up the issue of a total contact cast. He works at [employer] 02/29/2016 -- was to have a TCC applied today but due to social reasons he has opted not to get it done this week and will agree to get it done next week. [name] ([id number]) Electronic Signature(s) Signed: 03/09/2016 4:53:43 PM By: Judene Companion MD Previous Signature: 03/09/2016 3:58:59 PM Version By: Judene Companion MD Entered By: Judene Companion on 03/09/2016 16:53:43 [name] ([id number]) -------------------------------------------------------------------------------- Physical Exam Details Patient Name: Demchak, Nithin A. Date of Service: 03/09/2016 4:00 PM Medical Record Number: [id number] Patient Account Number: [id number] Date of Birth/Sex: 09-May-1956 (60 y.o. Male) Treating RN: Carolyne Fiscal, Debi Primary Care Physician: Viviana Simpler Other Clinician: Referring Physician: Viviana Simpler Treating Physician/Extender: Benjaman Pott in Treatment: 12 Electronic Signature(s) Signed: 03/09/2016 4:53:53 PM By: Judene Companion MD Previous Signature: 03/09/2016 3:59:12 PM Version By: Judene Companion MD Entered By: Judene Companion on 03/09/2016 16:53:53 [name] ([id number]) -------------------------------------------------------------------------------- Physician Orders Details Patient Name: Antu, Shawan A. Date of Service: 03/09/2016 4:00 PM Medical Record Number: [id number] Patient Account Number: [id number] Date of Birth/Sex: 1956-03-31 (59 y.o. 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   o May shower with protection. Primary Wound Dressing Wound #1 Plantar Foot o Prisma Ag Secondary Dressing Wound #1 Plantar Foot o Gauze and Kerlix/Conform Dressing Change Frequency Wound #1 Plantar Foot o Change dressing every other day. Follow-up Appointments Wound #1 Plantar Foot o Return Appointment in 1 week. Off-Loading Wound #1 Plantar Foot o Open toe surgical shoe to: - Front offloader darco o Other: - felt Additional Orders / Instructions [name], [name] ([id number]) Wound #1 Plantar Foot o Increase protein intake. o Activity as tolerated Electronic Signature(s) Signed: 03/09/2016 4:58:15 PM By: Montey Hora Signed: 03/10/2016 8:09:12 AM By: Judene Companion MD Entered By: Montey Hora on 03/09/2016 16:32:29 [place], [name] ([id number]) -------------------------------------------------------------------------------- Problem List Details Patient Name: Albers, Laymond A. Date of Service: 03/09/2016 4:00 PM Medical Record Number: [id number] Patient Account Number: [id number] Date of Birth/Sex: 08/18/1955 (60 y.o. Male) Treating RN: Carolyne Fiscal, Debi Primary Care Physician: Viviana Simpler Other Clinician: Referring Physician: Viviana Simpler Treating Physician/Extender: Benjaman Pott in Treatment: 12 Active Problems ICD-10 Encounter Code Description Active Date Diagnosis E11.621 Type 2 diabetes mellitus with foot ulcer 12/14/2015 Yes E11.42 Type 2 diabetes mellitus with diabetic  polyneuropathy 12/14/2015 Yes L97.513 Non-pressure chronic ulcer of other part of right foot with 01/12/2016 Yes necrosis of muscle Inactive Problems Resolved Problems Electronic Signature(s) Signed: 03/09/2016 4:53:16 PM By: Judene Companion MD Previous Signature: 03/09/2016 3:56:22 PM Version By: Judene Companion MD Entered By: Judene Companion on 03/09/2016 16:53:16 [name] ([id number]) -------------------------------------------------------------------------------- Progress Note Details Patient Name: [name], [name] Date of Service: 03/09/2016 4:00 PM Medical Record Number: [id number] Patient Account Number: [id number] Date of Birth/Sex: 11/30/1955 (60 y.o. Male) Treating RN: Ahmed Prima Primary Care Physician: Viviana Simpler Other Clinician: Referring Physician: Viviana Simpler Treating Physician/Extender: Benjaman Pott in Treatment: 12 Subjective Chief Complaint Information obtained from Patient 12/14/15; this is a 60 year old man who is a type II diabetic with diabetic neuropathy on insulin and metformin. Presents with a nonhealing ulcer on his right foot since January of this year History of Present Illness (HPI) 12/14/15; this is a 60 year old type II diabetic on insulin with diabetic polyneuropathy. He apparently had a callus at roughly his fourth metatarsal head in January he was attempting to remove however he ended up with a ulcer. His wife who was present said they have been using saline to clean this wound and using Polysporin and Betadine- but not making much progress. His previous surgery by his podiatrist Dr. Milinda Pointer in 2014 but does not wish to go back to see Dr. Milinda Pointer and was referred here instead. He has not been on any antibiotics and has not had recent x-rays of the foot. He has no known polyneuropathy and no prior wound history looking [facility] it appears that the patient had foot corrective surgery on 04/02/13 this consisted of an [facility]  bunion repair on the right second metatarsal, osteotomy of the right fifth metatarsal, osteotomy of the right hammertoe repair on #2 #3 in #5. Sometime after this he injured the foot he was noted to have fractures with fracture of the first metatarsal osteotomy fracture of the second metatarsal osteotomy dislocation of the third metatarsal phalangeal joint and fracture of the fifth metatarsal osteotomy site here and I believe he went back to the operating room and had internal fixation of the right foot. He has not had any recent x-rays of the right foot. He had an MRI of the right foot in 2014 last hemoglobin A1c I see is at 7.1 on  10/29/15. He had a normal serum albumin on 04/29/15 of 4.8. He does not have any nutritional issues 12/21/15; x-ray of the foot that showed no acute bony abnormality no radiopaque foreign bodies. Postsurgical changes in the right first metatarsal were stable healing of fractures in the distal second metatarsal, old healed right fifth metatarsal fracture cortical erosions noted in the right third and fourth metatarsals. We have been using Prisma 12/28/15 the area on his right foot at roughly the fourth metatarsal head is unchanged. Still some circumferential callus and nonviable subcutaneous tissue which requires debridement. No obvious infection 01/04/16; wound is unchanged. Notable for less circumferential callus. Still non-viable subcutaneous tissue which requires debridement. No obvious infection. The patient did not come prepared for a total contact cast we will put this on next week 01/12/16; dimensions of the wound unchanged. The patient did not want a total contact cast today but is prepared to talk about this in 2-3 weeks if there is no progression towards resolution. 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 01/18/16; I don't see much change in this wound which is on the right fourth metatarsal head. I have not been able to  talk him into a total contact cast. He continues to offload this with the Darco forefoot offloading boot, scooter at work etc. Creger, Choteau A. ([id number]) 02/01/16:no changes. no significant improvement with wound. no reported systemic s/s of infection. 02/22/16 right fourth metatarsal head. Still inferiorly deep wound. Debridement of circumferential callus nonviable subcutaneous tissue and surface slough. This apparently measures slightly smaller although not a major change since the last time I saw him. I have once again brought up the issue of a total contact cast. He works at [employer] 02/29/2016 -- was to have a TCC applied today but due to social reasons he has opted not to get it done this week and will agree to get it done next week. Objective Constitutional Vitals Time Taken: 4:22 PM, Height: 72 in, Weight: 223 lbs, BMI: 30.2, Temperature: 98.4 F, Pulse: 83 bpm, Respiratory Rate: 18 breaths/min, Blood Pressure: 148/75 mmHg. Integumentary (Hair, Skin) Wound #1 status is Open. Original cause of wound was Gradually Appeared. The wound is located on the Plantar Foot. The wound measures 0.2cm length x 0.1cm width x 0.2cm depth; 0.016cm^2 area and 0.003cm^3 volume. The wound is limited to skin breakdown. There is no tunneling or undermining noted. There is a small amount of serosanguineous drainage noted. The wound margin is distinct with the outline attached to the wound base. There is large (67-100%) pink, pale granulation within the wound bed. There is no necrotic tissue within the wound bed. The periwound skin appearance exhibited: Callus, Moist. The periwound skin appearance did not exhibit: Crepitus, Excoriation, Fluctuance, Friable, Induration, Localized Edema, Rash, Scarring, Dry/Scaly, Maceration, Atrophie Blanche, Cyanosis, Ecchymosis, Hemosiderin Staining, Mottled, Pallor, Rubor, Erythema. Periwound temperature was noted as No Abnormality. Assessment Active  Problems ICD-10 E11.621 - Type 2 diabetes mellitus with foot ulcer E11.42 - Type 2 diabetes mellitus with diabetic polyneuropathy L97.513 - Non-pressure chronic ulcer of other part of right foot with necrosis of muscle Diabetic ulcer plantar right foot much better with TCC, however he is declining to have it reapplied today. Therefore will use collagen and offload with felt protector and continue with boot. [name], [name] ([id number]) Procedures Wound #1 Wound #1 is a Diabetic Wound/Ulcer of the Lower Extremity located on the Plantar Foot . There was a Skin/Subcutaneous Tissue Debridement [id number]) debridement  with total area of 0.02 sq cm performed by Judene Companion, MD. with the following instrument(s): Scissors to remove Viable and Non- Viable tissue/material including Fibrin/Slough and Callus after achieving pain control using Lidocaine 4% Topical Solution. A time out was conducted prior to the start of the procedure. There was no bleeding. The procedure was tolerated well with a pain level of 0 throughout and a pain level of 0 following the procedure. Post Debridement Measurements: 0.1cm length x 0.2cm width x 0.2cm depth; 0.003cm^3 volume. Post procedure Diagnosis Wound #1: Same as Pre-Procedure Plan Wound Cleansing: Wound #1 Plantar Foot: Cleanse wound with mild soap and water May Shower, gently pat wound dry prior to applying new dressing. May shower with protection. Primary Wound Dressing: Wound #1 Plantar Foot: Prisma Ag Secondary Dressing: Wound #1 Plantar Foot: Gauze and Kerlix/Conform Dressing Change Frequency: Wound #1 Plantar Foot: Change dressing every other day. Follow-up Appointments: Wound #1 Plantar Foot: Return Appointment in 1 week. Off-Loading: Wound #1 Plantar Foot: Open toe surgical shoe to: - Front offloader darco Other: - felt Additional Orders / Instructions: Wound #1 Plantar Foot: Increase protein intake. Activity as tolerated Follow-Up  Appointments: [place] ([id number]) A follow-up appointment should be scheduled. Electronic Signature(s) Signed: 03/09/2016 4:57:02 PM By: Judene Companion MD Entered By: Judene Companion on 03/09/2016 16:57:01 [place], [name] ([id number]) -------------------------------------------------------------------------------- [place] Details Patient Name: Whan, Slade A. Date of Service: 03/09/2016 Medical Record Number: [id number] Patient Account Number: [id number] Date of Birth/Sex: 1956-01-13 (60 y.o. Male) Treating RN: Carolyne Fiscal, Debi Primary Care Physician: Viviana Simpler Other Clinician: Referring Physician: Viviana Simpler Treating Physician/Extender: Benjaman Pott in Treatment: 12 Diagnosis Coding ICD-10 Codes Code Description E11.621 Type 2 diabetes mellitus with foot ulcer E11.42 Type 2 diabetes mellitus with diabetic polyneuropathy L97.513 Non-pressure chronic ulcer of other part of right foot with necrosis of muscle Facility Procedures CPT4 Code: [id number] Description: [id number] - DEB SUBQ TISSUE 20 SQ CM/< ICD-10 Description Diagnosis E11.42 Type 2 diabetes mellitus with diabetic polyneurop Modifier: athy Quantity: 1 Physician Procedures CPT4 Code: [id number] Description: [id number] - WC PHYS LEVEL 3 - EST PT ICD-10 Description Diagnosis E11.621 Type 2 diabetes mellitus with foot ulcer Modifier: Quantity: 1 CPT4 Code: [id number] Description: [id number] - WC PHYS SUBQ TISS 20 SQ CM ICD-10 Description Diagnosis E11.42 Type 2 diabetes mellitus with diabetic polyneurop Modifier: athy Quantity: 1 Electronic Signature(s) Signed: 03/09/2016 4:57:32 PM By: Judene Companion MD Entered By: Judene Companion on 03/09/2016 16:57:32
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
## 2016-03-13 ENCOUNTER — Other Ambulatory Visit: Payer: Self-pay | Admitting: Internal Medicine

## 2016-03-14 ENCOUNTER — Encounter: Payer: 59 | Admitting: Internal Medicine

## 2016-03-14 DIAGNOSIS — E11621 Type 2 diabetes mellitus with foot ulcer: Secondary | ICD-10-CM | POA: Diagnosis not present

## 2016-03-15 NOTE — Progress Notes (Signed)
Bertino, ALESSO SCHWALENBERG ([id number]) Visit Report for 03/14/2016 Chief Complaint Document Details Patient Name: [name], [name] Date of Service: 03/14/2016 8:00 AM Medical Record Patient Account Number: [id number] Number: Treating RN: Montey Hora 1955-08-23 (60 y.o. Other Clinician: Date of Birth/Sex: Male) Treating Coraline Talwar Primary Care Physician: Viviana Simpler Physician/Extender: G Referring Physician: Marily Memos in Treatment: 13 Information Obtained from: Patient Chief Complaint 12/14/15; this is a 60 year old man who is a type II diabetic with diabetic neuropathy on insulin and metformin. Presents with a nonhealing ulcer on his right foot since January of this year Electronic Signature(s) Signed: 03/15/2016 7:31:48 AM By: Linton Ham MD Entered By: Linton Ham on 03/14/2016 08:39:06 Imai, [name] ([id number]) -------------------------------------------------------------------------------- Debridement Details Patient Name: [name], [name] Date of Service: 03/14/2016 8:00 AM Medical Record Patient Account Number: [id number] Number: Treating RN: Montey Hora 12-04-1955 (60 y.o. Other Clinician: Date of Birth/Sex: Male) Treating Barret Esquivel, [place] Primary Care Physician: Viviana Simpler Physician/Extender: G Referring Physician: Marily Memos in Treatment: 13 Debridement Performed for Wound #1 Plantar Foot Assessment: Performed By: Physician Ricard Dillon, MD Debridement: Debridement Pre-procedure Yes - 08:20 Verification/Time Out Taken: Start Time: 08:21 Pain Control: Lidocaine 4% Topical Solution Level: Skin/Subcutaneous Tissue Total Area Debrided (L x 0.6 (cm) x 0.3 (cm) = 0.18 (cm) W): Tissue and other Viable, Non-Viable, Callus, Fibrin/Slough, Subcutaneous material debrided: Instrument: Curette Bleeding: Minimum Hemostasis Achieved: Silver Nitrate End Time: 08:23 Procedural Pain: 0 Post Procedural Pain:  0 Response to Treatment: Procedure was tolerated well Post Debridement Measurements of Total Wound Length: (cm) 0.6 Width: (cm) 0.3 Depth: (cm) 0.2 Volume: (cm) 0.028 Character of Wound/Ulcer Post Stable Debridement: Severity of Tissue Post Debridement: Limited to breakdown of skin Post Procedure Diagnosis Same as Pre-procedure Electronic Signature(s) Signed: 03/14/2016 4:39:07 PM By: Montey Hora Signed: 03/15/2016 7:31:48 AM By: Linton Ham MD Sheaffer, [name] ([id number]) Entered By: Linton Ham on 03/14/2016 08:38:50 [name], [name] ([id number]) -------------------------------------------------------------------------------- HPI Details Patient Name: [name], [name] Date of Service: 03/14/2016 8:00 AM Medical Record Patient Account Number: [id number] Number: Treating RN: Montey Hora 1955/08/05 (60 y.o. Other Clinician: Date of Birth/Sex: Male) Treating Joeanna Howdyshell Primary Care Physician: Viviana Simpler Physician/Extender: G Referring Physician: Marily Memos in Treatment: 13 History of Present Illness HPI Description: 12/14/15; this is a 60 year old type II diabetic on insulin with diabetic polyneuropathy. He apparently had a callus at roughly his fourth metatarsal head in January he was attempting to remove however he ended up with a ulcer. His wife who was present said they have been using saline to clean this wound and using Polysporin and Betadine- but not making much progress. His previous surgery by his podiatrist Dr. Milinda Pointer in 2014 but does not wish to go back to see Dr. Milinda Pointer and was referred here instead. He has not been on any antibiotics and has not had recent x-rays of the foot. He has no known polyneuropathy and no prior wound history looking [facility] it appears that the patient had foot corrective surgery on 04/02/13 this consisted of an [facility] bunion repair on the right second metatarsal, osteotomy of the  right fifth metatarsal, osteotomy of the right hammertoe repair on #2 #3 in #5. Sometime after this he injured the foot he was noted to have fractures with fracture of the first metatarsal osteotomy fracture of the second metatarsal osteotomy dislocation of the third metatarsal phalangeal joint and fracture of the fifth metatarsal osteotomy site here and I believe he went  back to the operating room and had internal fixation of the right foot. He has not had any recent x-rays of the right foot. He had an MRI of the right foot in 2014 last hemoglobin A1c I see is at 7.1 on 10/29/15. He had a normal serum albumin on 04/29/15 of 4.8. He does not have any nutritional issues 12/21/15; x-ray of the foot that showed no acute bony abnormality no radiopaque foreign bodies. Postsurgical changes in the right first metatarsal were stable healing of fractures in the distal second metatarsal, old healed right fifth metatarsal fracture cortical erosions noted in the right third and fourth metatarsals. We have been using Prisma 12/28/15 the area on his right foot at roughly the fourth metatarsal head is unchanged. Still some circumferential callus and nonviable subcutaneous tissue which requires debridement. No obvious infection 01/04/16; wound is unchanged. Notable for less circumferential callus. Still non-viable subcutaneous tissue which requires debridement. No obvious infection. The patient did not come prepared for a total contact cast we will put this on next week 01/12/16; dimensions of the wound unchanged. The patient did not want a total contact cast today but is prepared to talk about this in 2-3 weeks if there is no progression towards resolution. 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 01/18/16; I don't see much change in this wound which is on the right fourth metatarsal head. I have not been able to talk him into a total contact cast. He continues to offload this  with the Darco forefoot offloading boot, scooter at work etc. 02/01/16:no changes. no significant improvement with wound. no reported systemic s/s of infection. 02/22/16 right fourth metatarsal head. Still inferiorly deep wound. Debridement of circumferential callus nonviable subcutaneous tissue and surface slough. This apparently measures slightly smaller although not a major change since the last time I saw him. I have once again brought up the issue of a total contact cast. He works at [employer]. ([id number]) 02/29/2016 -- was to have a TCC applied today but due to social reasons he has opted not to get it done this week and will agree to get it done next week. 03/14/16; the patient had one total contact cast applied, came back in 3 days for his reapplication but did not allow this to be replaced. Per our intake nurse there was a good improvement in the wound volume. I once again talked to him about this he simply will not agree to a total contact cast largely because he couldn't fit his pant leg over it. We have been using Prisma and he is back and is forefoot offloading boot Electronic Signature(s) Signed: 03/15/2016 7:31:48 AM By: Linton Ham MD Entered By: Linton Ham on 03/14/2016 08:40:30 [name], [name] ([id number]) -------------------------------------------------------------------------------- Physical Exam Details Patient Name: [name], [name] Date of Service: 03/14/2016 8:00 AM Medical Record Patient Account Number: [id number] Number: Treating RN: Montey Hora September 26, 1955 (60 y.o. Other Clinician: Date of Birth/Sex: Male) Treating Krishna Dancel Primary Care Physician: Viviana Simpler Physician/Extender: G Referring Physician: Marily Memos in Treatment: 13 Constitutional Sitting or standing Blood Pressure is within target range for patient.. Pulse regular and within target range for patient.Marland Kitchen Respirations regular, non-labored and within  target range.. Temperature is normal and within the target range for the patient.. Notes Wound exam; area over his right fourth metatarsal head. Measures 0.6 x 0.3 x 0.2 once again there is callus nonviable subcutaneous tissue which is easily debrided. Post  debridement the wound bed doesn't look unhealthy, healthy granulation tissue. He is not going to agree to any more offloading. Electronic Signature(s) Signed: 03/15/2016 7:31:48 AM By: Linton Ham MD Entered By: Linton Ham on 03/14/2016 08:42:15 [place]asing[place], [name] ([id number]) -------------------------------------------------------------------------------- Physician Orders Details Patient Name: [name], [name] Date of Service: 03/14/2016 8:00 AM Medical Record Patient Account Number: [id number] Number: Treating RN: Montey Hora February 27, 1956 (60 y.o. Other Clinician: Date of Birth/Sex: Male) Treating Sydny Schnitzler Primary Care Physician: Viviana Simpler Physician/Extender: G Referring Physician: Marily Memos in Treatment: 18 Verbal / Phone Orders: Yes Clinician: Montey Hora Read Back and Verified: Yes Diagnosis Coding Wound Cleansing Wound #1 Plantar Foot o Cleanse wound with mild soap and water o May Shower, gently pat wound dry prior to applying new dressing. o May shower with protection. Primary Wound Dressing Wound #1 Plantar Foot o Prisma Ag Secondary Dressing Wound #1 Plantar Foot o Gauze and Kerlix/Conform Dressing Change Frequency Wound #1 Plantar Foot o Change dressing every other day. Follow-up Appointments Wound #1 Plantar Foot o Return Appointment in 1 week. 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 o Activity as tolerated [name], [name] ([id number]) Electronic Signature(s) Signed: 03/14/2016 4:39:07 PM By:  Montey Hora Signed: 03/15/2016 7:31:48 AM By: Linton Ham MD Entered By: Montey Hora on 03/14/2016 08:23:29 [name], [name] ([id number]) -------------------------------------------------------------------------------- Progress Note Details Patient Name: [name], [name] Date of Service: 03/14/2016 8:00 AM Medical Record Patient Account Number: [id number] Number: Treating RN: Montey Hora 07/24/1956 (60 y.o. Other Clinician: Date of Birth/Sex: Male) Treating Adahlia Stembridge Primary Care Physician: Viviana Simpler Physician/Extender: G Referring Physician: Marily Memos in Treatment: 13 Subjective Chief Complaint Information obtained from Patient 12/14/15; this is a 60 year old man who is a type II diabetic with diabetic neuropathy on insulin and metformin. Presents with a nonhealing ulcer on his right foot since January of this year History of Present Illness (HPI) 12/14/15; this is a 60 year old type II diabetic on insulin with diabetic polyneuropathy. He apparently had a callus at roughly his fourth metatarsal head in January he was attempting to remove however he ended up with a ulcer. His wife who was present said they have been using saline to clean this wound and using Polysporin and Betadine- but not making much progress. His previous surgery by his podiatrist Dr. Milinda Pointer in 2014 but does not wish to go back to see Dr. Milinda Pointer and was referred here instead. He has not been on any antibiotics and has not had recent x-rays of the foot. He has no known polyneuropathy and no prior wound history [facility] it appears that the patient had foot corrective surgery on 04/02/13 this consisted of an [facility] bunion repair on the right second metatarsal, osteotomy of the right fifth metatarsal, osteotomy of the right hammertoe repair on #2 #3 in #5. Sometime after this he injured the foot he was noted to have fractures with fracture of the first  metatarsal osteotomy fracture of the second metatarsal osteotomy dislocation of the third metatarsal phalangeal joint and fracture of the fifth metatarsal osteotomy site here and I believe he went back to the operating room and had internal fixation of the right foot. He has not had any recent x-rays of the right foot. He had an MRI of the right foot in 2014 last hemoglobin A1c I see  is at 7.1 on 10/29/15. He had a normal serum albumin on 04/29/15 of 4.8. He does not have any nutritional issues 12/21/15; x-ray of the foot that showed no acute bony abnormality no radiopaque foreign bodies. Postsurgical changes in the right first metatarsal were stable healing of fractures in the distal second metatarsal, old healed right fifth metatarsal fracture cortical erosions noted in the right third and fourth metatarsals. We have been using Prisma 12/28/15 the area on his right foot at roughly the fourth metatarsal head is unchanged. Still some circumferential callus and nonviable subcutaneous tissue which requires debridement. No obvious infection 01/04/16; wound is unchanged. Notable for less circumferential callus. Still non-viable subcutaneous tissue which requires debridement. No obvious infection. The patient did not come prepared for a total contact cast we will put this on next week 01/12/16; dimensions of the wound unchanged. The patient did not want a total contact cast today but is prepared to talk about this in 2-3 weeks if there is no progression towards resolution. 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 01/18/16; I don't see much change in this wound which is on the right fourth metatarsal head. I have not [name], [name] ([id number]) been able to talk him into a total contact cast. He continues to offload this with the Darco forefoot offloading boot, scooter at work etc. 02/01/16:no changes. no significant improvement with wound. no reported systemic s/s of  infection. 02/22/16 right fourth metatarsal head. Still inferiorly deep wound. Debridement of circumferential callus nonviable subcutaneous tissue and surface slough. This apparently measures slightly smaller although not a major change since the last time I saw him. I have once again brought up the issue of a total contact cast. He works at [employer] 02/29/2016 -- was to have a TCC applied today but due to social reasons he has opted not to get it done this week and will agree to get it done next week. 03/14/16; the patient had one total contact cast applied, came back in 3 days for his reapplication but did not allow this to be replaced. Per our intake nurse there was a good improvement in the wound volume. I once again talked to him about this he simply will not agree to a total contact cast largely because he couldn't fit his pant leg over it. We have been using Prisma and he is back and is forefoot offloading boot Objective Constitutional Sitting or standing Blood Pressure is within target range for patient.. Pulse regular and within target range for patient.Marland Kitchen Respirations regular, non-labored and within target range.. Temperature is normal and within the target range for the patient.. Vitals Time Taken: 8:10 AM, Height: 72 in, Weight: 223 lbs, BMI: 30.2, Temperature: 98.3 F, Pulse: 86 bpm, Respiratory Rate: 18 breaths/min, Blood Pressure: 148/86 mmHg. General Notes: Wound exam; area over his right fourth metatarsal head. Measures 0.6 x 0.3 x 0.2 once again there is callus nonviable subcutaneous tissue which is easily debrided. Post debridement the wound bed doesn't look unhealthy, healthy granulation tissue. He is not going to agree to any more offloading. Integumentary (Hair, Skin) Wound #1 status is Open. Original cause of wound was Gradually Appeared. The wound is located on the Plantar Foot. The wound measures 0.6cm length x 0.3cm width x 0.2cm depth; 0.141cm^2 area and 0.028cm^3  volume. The wound is limited to skin breakdown. There is no tunneling or undermining noted. There is a small amount of serosanguineous drainage noted. The wound margin  is distinct with the outline attached to the wound base. There is large (67-100%) pink, pale granulation within the wound bed. There is no necrotic tissue within the wound bed. The periwound skin appearance exhibited: Callus, Moist. The periwound skin appearance did not exhibit: Crepitus, Excoriation, Fluctuance, Friable, Induration, Localized Edema, Rash, Scarring, Dry/Scaly, Maceration, Atrophie Blanche, Cyanosis, Ecchymosis, Hemosiderin Staining, Mottled, Pallor, Rubor, Erythema. Periwound temperature was noted as No Abnormality. Blumenstock, Kanishk A. ([id number]) Procedures Wound #1 Wound #1 is a Diabetic Wound/Ulcer of the Lower Extremity located on the Plantar Foot . There was a Skin/Subcutaneous Tissue Debridement [id number]) debridement with total area of 0.18 sq cm performed by Ricard Dillon, MD. with the following instrument(s): Curette to remove Viable and Non-Viable tissue/material including Fibrin/Slough, Callus, and Subcutaneous after achieving pain control using Lidocaine 4% Topical Solution. A time out was conducted at 08:20, prior to the start of the procedure. A Minimum amount of bleeding was controlled with Silver Nitrate. The procedure was tolerated well with a pain level of 0 throughout and a pain level of 0 following the procedure. Post Debridement Measurements: 0.6cm length x 0.3cm width x 0.2cm depth; 0.028cm^3 volume. Character of Wound/Ulcer Post Debridement is stable. Severity of Tissue Post Debridement is: Limited to breakdown of skin. Post procedure Diagnosis Wound #1: Same as Pre-Procedure Plan Wound Cleansing: Wound #1 Plantar Foot: Cleanse wound with mild soap and water May Shower, gently pat wound dry prior to applying new dressing. May shower with protection. Primary Wound Dressing: Wound #1  Plantar Foot: Prisma Ag Secondary Dressing: Wound #1 Plantar Foot: Gauze and Kerlix/Conform Dressing Change Frequency: Wound #1 Plantar Foot: Change dressing every other day. Follow-up Appointments: Wound #1 Plantar Foot: Return Appointment in 1 week. Off-Loading: Wound #1 Plantar Foot: Open toe surgical shoe to: - Front offloader darco Other: - felt Additional Orders / Instructions: Wound #1 Plantar Foot: Increase protein intake. Activity as tolerated Brashear, Jayvyn A. ([id number]) #1 we continued with Prisma. He is apparently not using felt #2 I am not going to be able to get him to agree again to a total contact cast. He states he wants cut appear of pants to get over the cast. Finds the boot too bulky etc. etc. Electronic Signature(s) Signed: 03/15/2016 7:31:48 AM By: Linton Ham MD Entered By: Linton Ham on 03/14/2016 08:43:18 [place]o, [name] ([id number]) -------------------------------------------------------------------------------- SuperBill Details Patient Name: Ki, Arth A. Date of Service: 03/14/2016 Medical Record Patient Account Number: [id number] Number: Treating RN: Montey Hora Jan 19, 1956 (60 y.o. Other Clinician: Date of Birth/Sex: Male) Treating Donja Tipping, [place] Primary Care Physician: Viviana Simpler Physician/Extender: G Referring Physician: Marily Memos in Treatment: 13 Diagnosis Coding ICD-10 Codes Code Description E11.621 Type 2 diabetes mellitus with foot ulcer E11.42 Type 2 diabetes mellitus with diabetic polyneuropathy L97.513 Non-pressure chronic ulcer of other part of right foot with necrosis of muscle Facility Procedures CPT4 Code: [id number] Description: [id number] - DEB SUBQ TISSUE 20 SQ CM/< ICD-10 Description Diagnosis E11.621 Type 2 diabetes mellitus with foot ulcer Modifier: Quantity: 1 Physician Procedures CPT4 Code: [id number] Description: [id number] - WC PHYS SUBQ TISS 20 SQ CM ICD-10 Description Diagnosis E11.621  Type 2 diabetes mellitus with foot ulcer Modifier: Quantity: 1 Electronic Signature(s) Signed: 03/15/2016 7:31:48 AM By: Linton Ham MD Entered By: Linton Ham on 03/14/2016 08:43:46

## 2016-03-15 NOTE — Progress Notes (Signed)
[name] ([id number]) Visit Report for 03/14/2016 Arrival Information Details Patient Name: [name], [name] Date of Service: 03/14/2016 8:00 AM Medical Record Number: [id number] Patient Account Number: [id number] Date of Birth/Sex: 27-Sep-1955 (60 y.o. Male) Treating RN: Montey Hora Primary Care Physician: Viviana Simpler Other Clinician: Referring Physician: Viviana Simpler Treating Physician/Extender: Tito Dine in Treatment: 72 Visit Information History Since Last Visit Added or deleted any medications: No Patient Arrived: Ambulatory Any new allergies or adverse reactions: No Arrival Time: 08:06 Had a fall or experienced change in No Accompanied By: self activities of daily living that may affect Transfer Assistance: None risk of falls: Patient Identification Verified: Yes Signs or symptoms of abuse/neglect since last No Secondary Verification Process Yes visito Completed: Hospitalized since last visit: No Patient Requires Transmission-Based No Pain Present Now: No Precautions: Patient Has Alerts: Yes Patient Alerts: HgbA1c 7.0 Electronic Signature(s) Signed: 03/14/2016 4:39:07 PM By: Montey Hora Entered By: Montey Hora on 03/14/2016 08:09:44 Charters, Gerda Diss ([id number]) -------------------------------------------------------------------------------- Encounter Discharge Information Details Patient Name: Rosamond, [name] Date of Service: 03/14/2016 8:00 AM Medical Record Number: [id number] Patient Account Number: [id number] Date of Birth/Sex: Dec 04, 1955 (60 y.o. Male) Treating RN: Montey Hora Primary Care Physician: Viviana Simpler Other Clinician: Referring Physician: Viviana Simpler Treating Physician/Extender: Tito Dine in Treatment: 31 Encounter Discharge Information Items Discharge Pain Level: 0 Discharge Condition: Stable Ambulatory Status: Ambulatory Discharge Destination: Home Transportation: Private Auto Accompanied  By: self Schedule Follow-up Appointment: Yes Medication Reconciliation completed and provided to Patient/Care No Cleotis Sparr: Provided on Clinical Summary of Care: 03/14/2016 Form Type Recipient Paper Patient JW Electronic Signature(s) Signed: 03/14/2016 8:54:50 AM By: Ruthine Dose Entered By: Ruthine Dose on 03/14/2016 08:54:50 Appleman, Gerda Diss ([id number]) -------------------------------------------------------------------------------- Multi Wound Chart Details Patient Name: Balzarini, [name] Date of Service: 03/14/2016 8:00 AM Medical Record Number: [id number] Patient Account Number: [id number] Date of Birth/Sex: 05/17/1956 (60 y.o. Male) Treating RN: Montey Hora Primary Care Physician: Viviana Simpler Other Clinician: Referring Physician: Viviana Simpler Treating Physician/Extender: Tito Dine in Treatment: 13 Vital Signs Height(in): 72 Pulse(bpm): 86 Weight(lbs): 223 Blood Pressure 148/86 (mmHg): Body Mass Index(BMI): 30 Temperature(F): 98.3 Respiratory Rate 18 (breaths/min): Photos: [N/A:N/A] Wound Location: Foot - Plantar N/A N/A Wounding Event: Gradually Appeared N/A N/A Primary Etiology: Diabetic Wound/Ulcer of N/A N/A the Lower Extremity Comorbid History: Hypertension, Type II N/A N/A Diabetes, Gout Date Acquired: 08/03/2015 N/A N/A Weeks of Treatment: 13 N/A N/A Wound Status: Open N/A N/A Measurements L x W x D 0.6x0.3x0.2 N/A N/A (cm) Area (cm) : 0.141 N/A N/A Volume (cm) : 0.028 N/A N/A % Reduction in Area: 91.40% N/A N/A % Reduction in Volume: 95.80% N/A N/A Classification: Grade 1 N/A N/A Exudate Amount: Small N/A N/A Exudate Type: Serosanguineous N/A N/A Exudate Color: red, brown N/A N/A Wound Margin: Distinct, outline attached N/A N/A Granulation Amount: Large (67-100%) N/A N/A Granulation Quality: Pink, Pale N/A N/A Necrotic Amount: None Present (0%) N/A N/A Steeber, [name] ([id number]) Exposed Structures: Fascia: No N/A  N/A Fat: No Tendon: No Muscle: No Joint: No Bone: No Limited to Skin Breakdown Epithelialization: None N/A N/A Periwound Skin Texture: Callus: Yes N/A N/A Edema: No Excoriation: No Induration: No Crepitus: No Fluctuance: No Friable: No Rash: No Scarring: No Periwound Skin Moist: Yes N/A N/A Moisture: Maceration: No Dry/Scaly: No Periwound Skin Color: Atrophie Blanche: No N/A N/A Cyanosis: No Ecchymosis: No Erythema: No Hemosiderin Staining: No Mottled: No Pallor: No Rubor: No Temperature: No Abnormality N/A N/A Tenderness on No N/A  N/A Palpation: Wound Preparation: Ulcer Cleansing: N/A N/A Rinsed/Irrigated with Saline Topical Anesthetic Applied: Other: lidocaine 4% Treatment Notes Electronic Signature(s) Signed: 03/14/2016 4:39:07 PM By: Montey Hora Entered By: Montey Hora on 03/14/2016 08:22:18 Mortell, Gerda Diss ([id number]) -------------------------------------------------------------------------------- [place] Details Patient Name: Carelli, [name] Date of Service: 03/14/2016 8:00 AM Medical Record Number: [id number] Patient Account Number: [id number] Date of Birth/Sex: 08/18/1955 (60 y.o. Male) Treating RN: Montey Hora Primary Care Physician: Viviana Simpler Other Clinician: Referring Physician: Viviana Simpler Treating Physician/Extender: Tito Dine in Treatment: 78 Active Inactive Abuse / Safety / Falls / Self Care Management Nursing Diagnoses: Impaired home maintenance Impaired physical mobility Knowledge deficit related to abuse or neglect Knowledge deficit related to: safety; personal, health (wound), emergency Potential for falls Goals: Patient will remain injury free Date Initiated: 12/14/2015 Goal Status: Active Patient/caregiver will verbalize understanding of skin care regimen Date Initiated: 12/14/2015 Goal Status: Active Patient/caregiver will verbalize/demonstrate measure taken to improve self  care Date Initiated: 12/14/2015 Goal Status: Active Patient/caregiver will verbalize/demonstrate measures taken to improve the patient's personal safety Date Initiated: 12/14/2015 Goal Status: Active Patient/caregiver will verbalize/demonstrate measures taken to prevent injury and/or falls Date Initiated: 12/14/2015 Goal Status: Active Patient/caregiver will verbalize/demonstrate understanding of what to do in case of emergency Date Initiated: 12/14/2015 Goal Status: Active Interventions: Assess fall risk on admission and as needed Assess: immobility, friction, shearing, incontinence upon admission and as needed Assess impairment of mobility on admission and as needed per policy Assess self care needs on admission and as needed Patient referred to community resources (specify in notes) Ernster, MACON ALEJANDRO. ([id number]) Provide education on basic hygiene Provide education on personal and home safety Provide education on safe transfers Provide education on vaccinations Treatment Activities: Education provided on Basic Hygiene : 01/12/2016 Notes: Orientation to the Wound Care Program Nursing Diagnoses: Knowledge deficit related to the wound healing center program Goals: Patient/caregiver will verbalize understanding of the [facility] Date Initiated: 12/14/2015 Goal Status: Active Interventions: Provide education on orientation to the wound center Notes: Peripheral Neuropathy Nursing Diagnoses: Knowledge deficit related to disease process and management of peripheral neurovascular dysfunction Potential alteration in peripheral tissue perfusion (select prior to confirmation of diagnosis) Goals: Patient/caregiver will verbalize understanding of disease process and disease management Date Initiated: 12/14/2015 Goal Status: Active Interventions: Assess signs and symptoms of neuropathy upon admission and as needed Provide education on Management of Neuropathy and Related  Ulcers Provide education on Management of Neuropathy upon discharge from the [place] Treatment Activities: Patient referred for customized footwear/offloading : 12/14/2015 Notes: Wound/Skin Impairment Buehrer, MATI STELLWAGEN ([id number]) Nursing Diagnoses: Impaired tissue integrity Knowledge deficit related to ulceration/compromised skin integrity Goals: Patient/caregiver will verbalize understanding of skin care regimen Date Initiated: 12/14/2015 Goal Status: Active Ulcer/skin breakdown will have a volume reduction of 30% by week 4 Date Initiated: 12/14/2015 Goal Status: Active Ulcer/skin breakdown will have a volume reduction of 50% by week 8 Date Initiated: 12/14/2015 Goal Status: Active Ulcer/skin breakdown will have a volume reduction of 80% by week 12 Date Initiated: 12/14/2015 Goal Status: Active Ulcer/skin breakdown will heal within 14 weeks Date Initiated: 12/14/2015 Goal Status: Active Interventions: Assess patient/caregiver ability to obtain necessary supplies Assess patient/caregiver ability to perform ulcer/skin care regimen upon admission and as needed Assess ulceration(s) every visit Provide education on ulcer and skin care Treatment Activities: Referred to [facility] for dressing supplies : 12/14/2015 Skin care regimen initiated : 12/14/2015 Topical wound management initiated : 12/14/2015 Notes: Electronic Signature(s) Signed: 03/14/2016  4:39:07 PM By: Montey Hora Entered By: Montey Hora on 03/14/2016 08:22:09 Hansell, Gerda Diss ([id number]) -------------------------------------------------------------------------------- Pain Assessment Details Patient Name: Loh, Kylon A. Date of Service: 03/14/2016 8:00 AM Medical Record Number: [id number] Patient Account Number: [id number] Date of Birth/Sex: June 20, 1956 (60 y.o. Male) Treating RN: Montey Hora Primary Care Physician: Viviana Simpler Other Clinician: Referring Physician: Viviana Simpler Treating  Physician/Extender: Tito Dine in Treatment: 13 Active Problems Location of Pain Severity and Description of Pain Patient Has Paino No Site Locations Pain Management and Medication Current Pain Management: Notes Topical or injectable lidocaine is offered to patient for acute pain when surgical debridement is performed. If needed, Patient is instructed to use over the counter pain medication for the following 24-48 hours after debridement. Wound care MDs do not prescribed pain medications. Patient has chronic pain or uncontrolled pain. Patient has been instructed to make an appointment with their Primary Care Physician for pain management. Electronic Signature(s) Signed: 03/14/2016 4:39:07 PM By: Montey Hora Entered By: Montey Hora on 03/14/2016 08:09:57 Figiel, Gerda Diss ([id number]) -------------------------------------------------------------------------------- Patient/Caregiver Education Details Patient Name: Waugh, Geovanie A. Date of Service: 03/14/2016 8:00 AM Medical Record Number: [id number] Patient Account Number: [id number] Date of Birth/Gender: Oct 17, 1955 (60 y.o. Male) Treating RN: Montey Hora Primary Care Physician: Viviana Simpler Other Clinician: Referring Physician: Viviana Simpler Treating Physician/Extender: Tito Dine in Treatment: 13 Education Assessment Education Provided To: Patient Education Topics Provided Wound/Skin Impairment: Handouts: Other: wound care and offloading as ordered Methods: Demonstration, Explain/Verbal Responses: State content correctly Electronic Signature(s) Signed: 03/14/2016 4:39:07 PM By: Montey Hora Entered By: Montey Hora on 03/14/2016 08:35:07 Tison, Gerda Diss ([id number]) -------------------------------------------------------------------------------- Wound Assessment Details Patient Name: Giambalvo, Jessica A. Date of Service: 03/14/2016 8:00 AM Medical Record Number: [id number] Patient Account  Number: [id number] Date of Birth/Sex: 12-26-1955 (60 y.o. Male) Treating RN: Montey Hora Primary Care Physician: Viviana Simpler Other Clinician: Referring Physician: Viviana Simpler Treating Physician/Extender: Tito Dine in Treatment: 13 Wound Status Wound Number: 1 Primary Diabetic Wound/Ulcer of the Lower Etiology: Extremity Wound Location: Foot - Plantar Wound Status: Open Wounding Event: Gradually Appeared Comorbid Hypertension, Type II Diabetes, Date Acquired: 08/03/2015 History: Gout Weeks Of Treatment: 13 Clustered Wound: No Photos Wound Measurements Length: (cm) 0.6 Width: (cm) 0.3 Depth: (cm) 0.2 Area: (cm) 0.141 Volume: (cm) 0.028 % Reduction in Area: 91.4% % Reduction in Volume: 95.8% Epithelialization: None Tunneling: No Undermining: No Wound Description Classification: Grade 1 Wound Margin: Distinct, outline attached Exudate Amount: Small Exudate Type: Serosanguineous Exudate Color: red, brown Foul Odor After Cleansing: No Wound Bed Granulation Amount: Large (67-100%) Exposed Structure Granulation Quality: Pink, Pale Fascia Exposed: No Necrotic Amount: None Present (0%) Fat Layer Exposed: No Tendon Exposed: No Muscle Exposed: No Faison, Danzig A. ([id number]) Joint Exposed: No Bone Exposed: No Limited to Skin Breakdown Periwound Skin Texture Texture Color No Abnormalities Noted: No No Abnormalities Noted: No Callus: Yes Atrophie Blanche: No Crepitus: No Cyanosis: No Excoriation: No Ecchymosis: No Fluctuance: No Erythema: No Friable: No Hemosiderin Staining: No Induration: No Mottled: No Localized Edema: No Pallor: No Rash: No Rubor: No Scarring: No Temperature / Pain Moisture Temperature: No Abnormality No Abnormalities Noted: No Dry / Scaly: No Maceration: No Moist: Yes Wound Preparation Ulcer Cleansing: Rinsed/Irrigated with Saline Topical Anesthetic Applied: Other: lidocaine 4%, Treatment Notes Wound #1  (Plantar Foot) 1. Cleansed with: Clean wound with Normal Saline 2. Anesthetic Topical Lidocaine 4% cream to wound bed prior to debridement 4. Dressing Applied: Prisma Ag 5. Secondary Dressing Applied Foam  Gauze and Kerlix/Conform Notes felt for offloading Electronic Signature(s) Signed: 03/14/2016 4:39:07 PM By: Montey Hora Entered By: Montey Hora on 03/14/2016 08:20:38 Grinder, Gerda Diss ([id number]) -------------------------------------------------------------------------------- [place] Details Patient Name: Vercher, Caedan A. Date of Service: 03/14/2016 8:00 AM Medical Record Number: [id number] Patient Account Number: [id number] Date of Birth/Sex: 04/20/1956 (60 y.o. Male) Treating RN: Montey Hora Primary Care Physician: Viviana Simpler Other Clinician: Referring Physician: Viviana Simpler Treating Physician/Extender: Tito Dine in Treatment: 13 Vital Signs Time Taken: 08:10 Temperature (F): 98.3 Height (in): 72 Pulse (bpm): 86 Weight (lbs): 223 Respiratory Rate (breaths/min): 18 Body Mass Index (BMI): 30.2 Blood Pressure (mmHg): 148/86 Reference Range: 80 - 120 mg / dl Electronic Signature(s) Signed: 03/14/2016 4:39:07 PM By: Montey Hora Entered By: Montey Hora on 03/14/2016 08:10:30

## 2016-03-21 ENCOUNTER — Encounter: Payer: 59 | Admitting: Internal Medicine

## 2016-03-21 DIAGNOSIS — E11621 Type 2 diabetes mellitus with foot ulcer: Secondary | ICD-10-CM | POA: Diagnosis not present

## 2016-03-23 NOTE — Progress Notes (Signed)
Hemsley, MAGNUM FLUCK ([id number]) Visit Report for 03/21/2016 Arrival Information Details Patient Name: [name], [name] Date of Service: 03/21/2016 10:00 AM Medical Record Number: [id number] Patient Account Number: [id number] Date of Birth/Sex: 02-27-1956 (60 y.o. Male) Treating RN: Montey Hora Primary Care Physician: Viviana Simpler Other Clinician: Referring Physician: Viviana Simpler Treating Physician/Extender: Tito Dine in Treatment: 14 Visit Information History Since Last Visit Added or deleted any medications: No Patient Arrived: Ambulatory Any new allergies or adverse reactions: No Arrival Time: 09:57 Had a fall or experienced change in No Accompanied By: self activities of daily living that may affect Transfer Assistance: None risk of falls: Patient Identification Verified: Yes Signs or symptoms of abuse/neglect since last No Secondary Verification Process Yes visito Completed: Hospitalized since last visit: No Patient Requires Transmission-Based No Pain Present Now: No Precautions: Patient Has Alerts: Yes Patient Alerts: HgbA1c 7.0 Electronic Signature(s) Signed: 03/21/2016 3:49:08 PM By: Montey Hora Entered By: Montey Hora on 03/21/2016 09:58:04 [name] ([id number]) -------------------------------------------------------------------------------- Encounter Discharge Information Details Patient Name: [name], [name] Date of Service: 03/21/2016 10:00 AM Medical Record Number: [id number] Patient Account Number: [id number] Date of Birth/Sex: March 25, 1956 (60 y.o. Male) Treating RN: Montey Hora Primary Care Physician: Viviana Simpler Other Clinician: Referring Physician: Viviana Simpler Treating Physician/Extender: Tito Dine in Treatment: 45 Encounter Discharge Information Items Discharge Pain Level: 0 Discharge Condition: Stable Ambulatory Status: Ambulatory Discharge Destination: Home Transportation: Private  Auto Accompanied By: self Schedule Follow-up Appointment: Yes Medication Reconciliation completed and provided to Patient/Care No Siddhant Hashemi: Provided on Clinical Summary of Care: 03/21/2016 Form Type Recipient Paper Patient JW Electronic Signature(s) Signed: 03/21/2016 11:22:20 AM By: Ruthine Dose Entered By: Ruthine Dose on 03/21/2016 11:22:19 [name] ([id number]) -------------------------------------------------------------------------------- Lower Extremity Assessment Details Patient Name: Goldston, [name] Date of Service: 03/21/2016 10:00 AM Medical Record Number: [id number] Patient Account Number: [id number] Date of Birth/Sex: [birth year] (60 y.o. Male) Treating RN: Montey Hora Primary Care Physician: Viviana Simpler Other Clinician: Referring Physician: Viviana Simpler Treating Physician/Extender: Tito Dine in Treatment: 14 Vascular Assessment Pulses: Posterior Tibial Dorsalis Pedis Palpable: [Right:Yes] Extremity colors, hair growth, and conditions: Extremity Color: [Right:Normal] Hair Growth on Extremity: [Right:Yes] Temperature of Extremity: [Right:Warm] Capillary Refill: [Right:< 3 seconds] Electronic Signature(s) Signed: 03/21/2016 3:49:08 PM By: Montey Hora Entered By: Montey Hora on 03/21/2016 10:04:27 [name] ([id number]) -------------------------------------------------------------------------------- Multi Wound Chart Details Patient Name: Ochs, [name] Date of Service: 03/21/2016 10:00 AM Medical Record Number: [id number] Patient Account Number: [id number] Date of Birth/Sex: Feb 25, 1956 (60 y.o. Male) Treating RN: Montey Hora Primary Care Physician: Viviana Simpler Other Clinician: Referring Physician: Viviana Simpler Treating Physician/Extender: Tito Dine in Treatment: 14 Vital Signs Height(in): 72 Pulse(bpm): 85 Weight(lbs): 223 Blood Pressure 135/81 (mmHg): Body Mass Index(BMI):  30 Temperature(F): 97.8 Respiratory Rate 18 (breaths/min): Photos: [N/A:N/A] Wound Location: Foot - Plantar N/A N/A Wounding Event: Gradually Appeared N/A N/A Primary Etiology: Diabetic Wound/Ulcer of N/A N/A the Lower Extremity Comorbid History: Hypertension, Type II N/A N/A Diabetes, Gout Date Acquired: 08/03/2015 N/A N/A Weeks of Treatment: 14 N/A N/A Wound Status: Open N/A N/A Measurements L x W x D 0.5x0.3x0.3 N/A N/A (cm) Area (cm) : 0.118 N/A N/A Volume (cm) : 0.035 N/A N/A % Reduction in Area: 92.80% N/A N/A % Reduction in Volume: 94.70% N/A N/A Classification: Grade 1 N/A N/A Exudate Amount: Small N/A N/A Exudate Type: Serosanguineous N/A N/A Exudate Color: red, brown N/A N/A Wound Margin: Distinct, outline attached N/A N/A Granulation Amount: Large (67-100%) N/A N/A  Granulation Quality: Pink, Pale N/A N/A Necrotic Amount: None Present (0%) N/A N/A Bosshart, Claud A. ([id number]) Exposed Structures: Fascia: No N/A N/A Fat: No Tendon: No Muscle: No Joint: No Bone: No Limited to Skin Breakdown Epithelialization: None N/A N/A Periwound Skin Texture: Callus: Yes N/A N/A Edema: No Excoriation: No Induration: No Crepitus: No Fluctuance: No Friable: No Rash: No Scarring: No Periwound Skin Moist: Yes N/A N/A Moisture: Maceration: No Dry/Scaly: No Periwound Skin Color: Atrophie Blanche: No N/A N/A Cyanosis: No Ecchymosis: No Erythema: No Hemosiderin Staining: No Mottled: No Pallor: No Rubor: No Temperature: No Abnormality N/A N/A Tenderness on No N/A N/A Palpation: Wound Preparation: Ulcer Cleansing: N/A N/A Rinsed/Irrigated with Saline Topical Anesthetic Applied: Other: lidocaine 4% Treatment Notes Electronic Signature(s) Signed: 03/21/2016 3:49:08 PM By: Montey Hora Entered By: Montey Hora on 03/21/2016 10:11:42 [name]  ([id number]) -------------------------------------------------------------------------------- [place] Details Patient Name: Leeman, Daulton A. Date of Service: 03/21/2016 10:00 AM Medical Record Number: [id number] Patient Account Number: [id number] Date of Birth/Sex: [birth year] (60 y.o. Male) Treating RN: Montey Hora Primary Care Physician: Viviana Simpler Other Clinician: Referring Physician: Viviana Simpler Treating Physician/Extender: Tito Dine in Treatment: 20 Active Inactive Abuse / Safety / Falls / Self Care Management Nursing Diagnoses: Impaired home maintenance Impaired physical mobility Knowledge deficit related to abuse or neglect Knowledge deficit related to: safety; personal, health (wound), emergency Potential for falls Goals: Patient will remain injury free Date Initiated: 12/14/2015 Goal Status: Active Patient/caregiver will verbalize understanding of skin care regimen Date Initiated: 12/14/2015 Goal Status: Active Patient/caregiver will verbalize/demonstrate measure taken to improve self care Date Initiated: 12/14/2015 Goal Status: Active Patient/caregiver will verbalize/demonstrate measures taken to improve the patient's personal safety Date Initiated: 12/14/2015 Goal Status: Active Patient/caregiver will verbalize/demonstrate measures taken to prevent injury and/or falls Date Initiated: 12/14/2015 Goal Status: Active Patient/caregiver will verbalize/demonstrate understanding of what to do in case of emergency Date Initiated: 12/14/2015 Goal Status: Active Interventions: Assess fall risk on admission and as needed Assess: immobility, friction, shearing, incontinence upon admission and as needed Assess impairment of mobility on admission and as needed per policy Assess self care needs on admission and as needed Patient referred to community resources (specify in notes) [name], Bradley SCHARRER. ([id number]) Provide education on basic  hygiene Provide education on personal and home safety Provide education on safe transfers Provide education on vaccinations Treatment Activities: Education provided on Basic Hygiene : 02/22/2016 Notes: Orientation to the Wound Care Program Nursing Diagnoses: Knowledge deficit related to the wound healing center program Goals: Patient/caregiver will verbalize understanding of the [facility] Date Initiated: 12/14/2015 Goal Status: Active Interventions: Provide education on orientation to the wound center Notes: Peripheral Neuropathy Nursing Diagnoses: Knowledge deficit related to disease process and management of peripheral neurovascular dysfunction Potential alteration in peripheral tissue perfusion (select prior to confirmation of diagnosis) Goals: Patient/caregiver will verbalize understanding of disease process and disease management Date Initiated: 12/14/2015 Goal Status: Active Interventions: Assess signs and symptoms of neuropathy upon admission and as needed Provide education on Management of Neuropathy and Related Ulcers Provide education on Management of Neuropathy upon discharge from the [facility] Treatment Activities: Patient referred for customized footwear/offloading : 12/14/2015 Notes: Wound/Skin Impairment [name], Bradley ZIEHM ([id number]) Nursing Diagnoses: Impaired tissue integrity Knowledge deficit related to ulceration/compromised skin integrity Goals: Patient/caregiver will verbalize understanding of skin care regimen Date Initiated: 12/14/2015 Goal Status: Active Ulcer/skin breakdown will have a volume reduction of 30% by week 4 Date Initiated: 12/14/2015 Goal Status: Active Ulcer/skin breakdown  will have a volume reduction of 50% by week 8 Date Initiated: 12/14/2015 Goal Status: Active Ulcer/skin breakdown will have a volume reduction of 80% by week 12 Date Initiated: 12/14/2015 Goal Status: Active Ulcer/skin breakdown will heal within 14  weeks Date Initiated: 12/14/2015 Goal Status: Active Interventions: Assess patient/caregiver ability to obtain necessary supplies Assess patient/caregiver ability to perform ulcer/skin care regimen upon admission and as needed Assess ulceration(s) every visit Provide education on ulcer and skin care Treatment Activities: Referred to DME Cordella Nyquist for dressing supplies : 12/14/2015 Skin care regimen initiated : 12/14/2015 Topical wound management initiated : 12/14/2015 Notes: Electronic Signature(s) Signed: 03/21/2016 3:49:08 PM By: Montey Hora Entered By: Montey Hora on 03/21/2016 10:11:16 Cockrell, Gerda [name] ([id number]) -------------------------------------------------------------------------------- Pain Assessment Details Patient Name: Hockett, [name] Date of Service: 03/21/2016 10:00 AM Medical Record Number: [id number] Patient Account Number: [id number] Date of Birth/Sex: July 27, 1955 (60 y.o. Male) Treating RN: Montey Hora Primary Care Physician: Viviana Simpler Other Clinician: Referring Physician: Viviana Simpler Treating Physician/Extender: Tito Dine in Treatment: 14 Active Problems Location of Pain Severity and Description of Pain Patient Has Paino No Site Locations Pain Management and Medication Current Pain Management: Notes Topical or injectable lidocaine is offered to patient for acute pain when surgical debridement is performed. If needed, Patient is instructed to use over the counter pain medication for the following 24-48 hours after debridement. Wound care MDs do not prescribed pain medications. Patient has chronic pain or uncontrolled pain. Patient has been instructed to make an appointment with their Primary Care Physician for pain management. Electronic Signature(s) Signed: 03/21/2016 3:49:08 PM By: Montey Hora Entered By: Montey Hora on 03/21/2016 09:59:39 Hlad, Gerda [name]  ([id number]) -------------------------------------------------------------------------------- Patient/Caregiver Education Details Patient Name: [name], [name] Date of Service: 03/21/2016 10:00 AM Medical Record Number: [id number] Patient Account Number: [id number] Date of Birth/Gender: 04-23-1956 (60 y.o. Male) Treating RN: Montey Hora Primary Care Physician: Viviana Simpler Other Clinician: Referring Physician: Viviana Simpler Treating Physician/Extender: Tito Dine in Treatment: 14 Education Assessment Education Provided To: Patient Education Topics Provided Wound/Skin Impairment: Handouts: Other: need for appropriate offloading Methods: Demonstration, Explain/Verbal Responses: State content correctly Electronic Signature(s) Signed: 03/21/2016 3:49:08 PM By: Montey Hora Entered By: Montey Hora on 03/21/2016 10:26:36 Neely, Gerda [name] ([id number]) -------------------------------------------------------------------------------- Wound Assessment Details Patient Name: [name], [name] Date of Service: 03/21/2016 10:00 AM Medical Record Number: [id number] Patient Account Number: [id number] Date of Birth/Sex: 11-22-1955 (60 y.o. Male) Treating RN: Montey Hora Primary Care Physician: Viviana Simpler Other Clinician: Referring Physician: Viviana Simpler Treating Physician/Extender: Tito Dine in Treatment: 14 Wound Status Wound Number: 1 Primary Diabetic Wound/Ulcer of the Lower Etiology: Extremity Wound Location: Foot - Plantar Wound Status: Open Wounding Event: Gradually Appeared Comorbid Hypertension, Type II Diabetes, Date Acquired: 08/03/2015 History: Gout Weeks Of Treatment: 14 Clustered Wound: No Photos Wound Measurements Length: (cm) 0.5 Width: (cm) 0.3 Depth: (cm) 0.3 Area: (cm) 0.118 Volume: (cm) 0.035 % Reduction in Area: 92.8% % Reduction in Volume: 94.7% Epithelialization: None Tunneling: No Undermining: No Wound  Description Classification: Grade 1 Wound Margin: Distinct, outline attached Exudate Amount: Small Exudate Type: Serosanguineous Exudate Color: red, brown Foul Odor After Cleansing: No Wound Bed Granulation Amount: Large (67-100%) Exposed Structure Granulation Quality: Pink, Pale Fascia Exposed: No Necrotic Amount: None Present (0%) Fat Layer Exposed: No Tendon Exposed: No Muscle Exposed: No [name], [name] ([id number]) Joint Exposed: No Bone Exposed: No Limited to Skin Breakdown Periwound Skin Texture Texture Color No Abnormalities Noted: No No Abnormalities Noted: No  Callus: Yes Atrophie Blanche: No Crepitus: No Cyanosis: No Excoriation: No Ecchymosis: No Fluctuance: No Erythema: No Friable: No Hemosiderin Staining: No Induration: No Mottled: No Localized Edema: No Pallor: No Rash: No Rubor: No Scarring: No Temperature / Pain Moisture Temperature: No Abnormality No Abnormalities Noted: No Dry / Scaly: No Maceration: No Moist: Yes Wound Preparation Ulcer Cleansing: Rinsed/Irrigated with Saline Topical Anesthetic Applied: Other: lidocaine 4%, Treatment Notes Wound #1 (Plantar Foot) 1. Cleansed with: Clean wound with Normal Saline 2. Anesthetic Topical Lidocaine 4% cream to wound bed prior to debridement 4. Dressing Applied: Prisma Ag Other dressing (specify in notes) 5. Secondary Dressing Applied Dry Gauze 7. Secured with Tape Notes felt for offloading Electronic Signature(s) Signed: 03/21/2016 3:49:08 PM By: Montey Hora Entered By: Montey Hora on 03/21/2016 10:05:09 Wenz, Gerda [name] ([id number]) Radich, Gerda [name] ([id number]) -------------------------------------------------------------------------------- [place] Details Patient Name: [name], [name] Date of Service: 03/21/2016 10:00 AM Medical Record Number: [id number] Patient Account Number: [id number] Date of Birth/Sex: 30-Jul-1955 (60 y.o. Male) Treating RN: Montey Hora Primary Care  Physician: Viviana Simpler Other Clinician: Referring Physician: Viviana Simpler Treating Physician/Extender: Tito Dine in Treatment: 14 Vital Signs Time Taken: 09:59 Temperature (F): 97.8 Height (in): 72 Pulse (bpm): 85 Weight (lbs): 223 Respiratory Rate (breaths/min): 18 Body Mass Index (BMI): 30.2 Blood Pressure (mmHg): 135/81 Reference Range: 80 - 120 mg / dl Electronic Signature(s) Signed: 03/21/2016 3:49:08 PM By: Montey Hora Entered By: Montey Hora on 03/21/2016 10:00:39

## 2016-03-23 NOTE — Progress Notes (Signed)
Eidem, TRUMAN TUDISCO ([id number]) Visit Report for 03/21/2016 Chief Complaint Document Details Patient Name: [name], [name] Date of Service: 03/21/2016 10:00 AM Medical Record Patient Account Number: [id number] Number: Treating RN: Montey Hora 06/05/1956 (60 y.o. Other Clinician: Date of Birth/Sex: Male) Treating Donnie Panik Primary Care Physician: Viviana Simpler Physician/Extender: G Referring Physician: Marily Memos in Treatment: 14 Information Obtained from: Patient Chief Complaint 12/14/15; this is a 60 year old man who is a type II diabetic with diabetic neuropathy on insulin and metformin. Presents with a nonhealing ulcer on his right foot since January of this year Electronic Signature(s) Signed: 03/22/2016 4:32:31 PM By: Linton Ham MD Entered By: Linton Ham on 03/21/2016 12:18:34 [name], [name] ([id number]) -------------------------------------------------------------------------------- Debridement Details Patient Name: [name], [name] Date of Service: 03/21/2016 10:00 AM Medical Record Patient Account Number: [id number] Number: Treating RN: Montey Hora [birth year] (60 y.o. Other Clinician: Date of Birth/Sex: Male) Treating Kache Mcclurg Primary Care Physician: Viviana Simpler Physician/Extender: G Referring Physician: Marily Memos in Treatment: 14 Debridement Performed for Wound #1 Plantar Foot Assessment: Performed By: Physician Ricard Dillon, MD Debridement: Debridement Pre-procedure Yes - 10:10 Verification/Time Out Taken: Start Time: 10:11 Pain Control: Lidocaine 4% Topical Solution Level: Skin/Subcutaneous Tissue Total Area Debrided (L x 0.5 (cm) x 0.3 (cm) = 0.15 (cm) W): Tissue and other Viable, Non-Viable, Callus, Fibrin/Slough, Subcutaneous material debrided: Instrument: Curette Bleeding: Minimum Hemostasis Achieved: Pressure End Time: 10:13 Procedural Pain: 0 Post Procedural Pain:  0 Response to Treatment: Procedure was tolerated well Post Debridement Measurements of Total Wound Length: (cm) 0.5 Width: (cm) 0.3 Depth: (cm) 0.5 Volume: (cm) 0.059 Character of Wound/Ulcer Post Stable Debridement: Severity of Tissue Post Debridement: Fat layer exposed Post Procedure Diagnosis Same as Pre-procedure Electronic Signature(s) Signed: 03/21/2016 3:49:08 PM By: Montey Hora Signed: 03/22/2016 4:32:31 PM By: Linton Ham MD [name], [name] ([id number]) Entered By: Linton Ham on 03/21/2016 12:17:44 [name], [name] ([id number]) -------------------------------------------------------------------------------- HPI Details Patient Name: [name], [name] Date of Service: 03/21/2016 10:00 AM Medical Record Patient Account Number: [id number] Number: Treating RN: Montey Hora 08/16/1955 (60 y.o. Other Clinician: Date of Birth/Sex: Male) Treating Elexa Kivi Primary Care Physician: Viviana Simpler Physician/Extender: G Referring Physician: Marily Memos in Treatment: 14 History of Present Illness HPI Description: 12/14/15; this is a 60 year old type II diabetic on insulin with diabetic polyneuropathy. He apparently had a callus at roughly his fourth metatarsal head in January he was attempting to remove however he ended up with a ulcer. His wife who was present said they have been using saline to clean this wound and using Polysporin and Betadine- but not making much progress. His previous surgery by his podiatrist Dr. Milinda Pointer in 2014 but does not wish to go back to see Dr. Milinda Pointer and was referred here instead. He has not been on any antibiotics and has not had recent x-rays of the foot. He has no known polyneuropathy and no prior wound history looking [facility] it appears that the patient had foot corrective surgery on 04/02/13 this consisted of an [facility] bunion repair on the right second metatarsal, osteotomy of the right fifth  metatarsal, osteotomy of the right hammertoe repair on #2 #3 in #5. Sometime after this he injured the foot he was noted to have fractures with fracture of the first metatarsal osteotomy fracture of the second metatarsal osteotomy dislocation of the third metatarsal phalangeal joint and fracture of the fifth metatarsal osteotomy site here and I believe he went back to the  operating room and had internal fixation of the right foot. He has not had any recent x-rays of the right foot. He had an MRI of the right foot in 2014 last hemoglobin A1c I see is at 7.1 on 10/29/15. He had a normal serum albumin on 04/29/15 of 4.8. He does not have any nutritional issues 12/21/15; x-ray of the foot that showed no acute bony abnormality no radiopaque foreign bodies. Postsurgical changes in the right first metatarsal were stable healing of fractures in the distal second metatarsal, old healed right fifth metatarsal fracture cortical erosions noted in the right third and fourth metatarsals. We have been using Prisma 12/28/15 the area on his right foot at roughly the fourth metatarsal head is unchanged. Still some circumferential callus and nonviable subcutaneous tissue which requires debridement. No obvious infection 01/04/16; wound is unchanged. Notable for less circumferential callus. Still non-viable subcutaneous tissue which requires debridement. No obvious infection. The patient did not come prepared for a total contact cast we will put this on next week 01/12/16; dimensions of the wound unchanged. The patient did not want a total contact cast today but is prepared to talk about this in 2-3 weeks if there is no progression towards resolution. 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 01/18/16; I don't see much change in this wound which is on the right fourth metatarsal head. I have not been able to talk him into a total contact cast. He continues to offload this with the  Darco forefoot offloading boot, scooter at work etc. 02/01/16:no changes. no significant improvement with wound. no reported systemic s/s of infection. 02/22/16 right fourth metatarsal head. Still inferiorly deep wound. Debridement of circumferential callus nonviable subcutaneous tissue and surface slough. This apparently measures slightly smaller although not a major change since the last time I saw him. I have once again brought up the issue of a total contact cast. He works at [employer]. ([id number]) 02/29/2016 -- was to have a TCC applied today but due to social reasons he has opted not to get it done this week and will agree to get it done next week. 03/14/16; the patient had one total contact cast applied, came back in 3 days for his reapplication but did not allow this to be replaced. Per our intake nurse there was a good improvement in the wound volume. I once again talked to him about this he simply will not agree to a total contact cast largely because he couldn't fit his pant leg over it. We have been using Prisma and he is back and is forefoot offloading boot 03/21/16: using prisma Electronic Signature(s) Signed: 03/22/2016 4:32:31 PM By: Linton Ham MD Entered By: Linton Ham on 03/21/2016 12:20:21 [name], [name] ([id number]) -------------------------------------------------------------------------------- Physical Exam Details Patient Name: [name], [name] Date of Service: 03/21/2016 10:00 AM Medical Record Patient Account Number: [id number] Number: Treating RN: Montey Hora [birth year] (60 y.o. Other Clinician: Date of Birth/Sex: Male) Treating Domnick Chervenak Primary Care Physician: Viviana Simpler Physician/Extender: G Referring Physician: Marily Memos in Treatment: 14 Notes Wound exam; the areas over his right fourth metatarsal head plantar aspect. The depth that this is a lot worse after debridement this week. Currently a small  wound with Adaptic. What I can see of the base of this looks healthy however. When he had a total contact cast on the wound dramatically improved however once again today he will not agree to this Electronic Signature(s)  Signed: 03/22/2016 4:32:31 PM By: Linton Ham MD Entered By: Linton Ham on 03/21/2016 12:21:55 [name], [name] ([id number]) -------------------------------------------------------------------------------- Physician Orders Details Patient Name: [name], [name] Date of Service: 03/21/2016 10:00 AM Medical Record Patient Account Number: [id number] Number: Treating RN: Montey Hora 1956/06/24 (60 y.o. Other Clinician: Date of Birth/Sex: Male) Treating Mykira Hofmeister Primary Care Physician: Viviana Simpler Physician/Extender: G Referring Physician: Marily Memos in Treatment: 14 Verbal / Phone Orders: Yes Clinician: Montey Hora Read Back and Verified: Yes Diagnosis Coding Wound Cleansing Wound #1 Plantar Foot o Cleanse wound with mild soap and water o May Shower, gently pat wound dry prior to applying new dressing. o May shower with protection. Primary Wound Dressing Wound #1 Plantar Foot o Prisma Ag Secondary Dressing Wound #1 Plantar Foot o Gauze and Kerlix/Conform Dressing Change Frequency Wound #1 Plantar Foot o Change dressing every other day. Follow-up Appointments Wound #1 Plantar Foot o Return Appointment in 1 week. 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 o Activity as tolerated Andrus, JAMESYN HOKENSON ([id number]) Electronic Signature(s) Signed: 03/21/2016 3:49:08 PM By: Montey Hora Signed: 03/22/2016 4:32:31 PM By: Linton Ham MD Entered By: Montey Hora on 03/21/2016 10:13:44 Vidas, [name]  ([id number]) -------------------------------------------------------------------------------- Problem List Details Patient Name: [name], [name] Date of Service: 03/21/2016 10:00 AM Medical Record Patient Account Number: [id number] Number: Treating RN: Montey Hora 09/16/1955 (60 y.o. 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 Date of Service: 03/21/2016 10:00 AM Medical Record Patient Account Number: [id number] Number: Treating RN: Montey Hora July 22, 1956 (60 y.o. Other Clinician: Date of Birth/Sex: Male) Treating Timoty Bourke Primary Care Physician: Viviana Simpler Physician/Extender: G Referring Physician: Marily Memos in Treatment: 14 Subjective Chief Complaint Information obtained from Patient 12/14/15; this is a 60 year old man who is a type II diabetic with diabetic neuropathy on insulin and metformin. Presents with a nonhealing ulcer on his right foot since January of this year History of Present Illness (HPI) 12/14/15; this is a 60 year old type II diabetic on insulin with diabetic polyneuropathy. He apparently had a callus at roughly his fourth metatarsal head  in January he was attempting to remove however he ended up with a ulcer. His wife who was present said they have been using saline to clean this wound and using Polysporin and Betadine- but not making much progress. His previous surgery by his podiatrist Dr. Milinda Pointer in 2014 but does not wish to go back to see Dr. Milinda Pointer and was referred here instead. He has not been on any antibiotics and has not had recent x-rays of the foot. He has no known polyneuropathy and no prior wound history looking [facility] it appears that the patient had foot corrective surgery on 04/02/13 this consisted of an [facility] bunion repair on the right second metatarsal, osteotomy  of the right fifth metatarsal, osteotomy of the right hammertoe repair on #2 #3 in #5. Sometime after this he injured the foot he was noted to have fractures with fracture of the first metatarsal osteotomy fracture of the second metatarsal osteotomy dislocation of the third metatarsal phalangeal joint and fracture of the fifth metatarsal osteotomy site here and I believe he went back to the operating room and had internal fixation of the right foot. He has not had any recent x-rays of the right foot. He had an MRI of the right foot in 2014 last hemoglobin A1c I see is at 7.1 on 10/29/15. He had a normal serum albumin on 04/29/15 of 4.8. He does not have any nutritional issues 12/21/15; x-ray of the foot that showed no acute bony abnormality no radiopaque foreign bodies. Postsurgical changes in the right first metatarsal were stable healing of fractures in the distal second metatarsal, old healed right fifth metatarsal fracture cortical erosions noted in the right third and fourth metatarsals. We have been using Prisma 12/28/15 the area on his right foot at roughly the fourth metatarsal head is unchanged. Still some circumferential callus and nonviable subcutaneous tissue which requires debridement. No obvious infection 01/04/16; wound is  unchanged. Notable for less circumferential callus. Still non-viable subcutaneous tissue which requires debridement. No obvious infection. The patient did not come prepared for a total contact cast we will put this on next week 01/12/16; dimensions of the wound unchanged. The patient did not want a total contact cast today but is prepared to talk about this in 2-3 weeks if there is no progression towards resolution. 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 01/18/16; I don't see much change in this wound which is on the right fourth metatarsal head. I have not [name], [name] ([id number]) been able to talk him into a total contact cast. He continues to offload this with the Darco forefoot offloading boot, scooter at work etc. 02/01/16:no changes. no significant improvement with wound. no reported systemic s/s of infection. 02/22/16 right fourth metatarsal head. Still inferiorly deep wound. Debridement of circumferential callus nonviable subcutaneous tissue and surface slough. This apparently measures slightly smaller although not a major change since the last time I saw him. I have once again brought up the issue of a total contact cast. He works at [employer] 02/29/2016 -- was to have a TCC applied today but due to social reasons he has opted not to get it done this week and will agree to get it done next week. 03/14/16; the patient had one total contact cast applied, came back in 3 days for his reapplication but did not allow this to be replaced. Per our intake nurse there was a good improvement in the wound volume. I once again talked to him about this he simply will not agree to a total contact cast largely because he couldn't fit his pant leg over it. We have been using Prisma and he is back and is forefoot offloading boot 03/21/16: using prisma Objective Constitutional Vitals Time Taken: 9:59 AM, Height: 72 in, Weight: 223 lbs, BMI: 30.2, Temperature: 97.8  F, Pulse: 85 bpm, Respiratory Rate: 18 breaths/min, Blood Pressure: 135/81 mmHg. Integumentary (Hair, Skin) Wound #1 status is Open. Original cause of wound was Gradually Appeared. The wound is located on the Plantar Foot. The wound measures 0.5cm length x 0.3cm width x 0.3cm depth; 0.118cm^2 area and 0.035cm^3 volume. The wound is limited to skin breakdown. There  is no tunneling or undermining noted. There is a small amount of serosanguineous drainage noted. The wound margin is distinct with the outline attached to the wound base. There is large (67-100%) pink, pale granulation within the wound bed. There is no necrotic tissue within the wound bed. The periwound skin appearance exhibited: Callus, Moist. The periwound skin appearance did not exhibit: Crepitus, Excoriation, Fluctuance, Friable, Induration, Localized Edema, Rash, Scarring, Dry/Scaly, Maceration, Atrophie Blanche, Cyanosis, Ecchymosis, Hemosiderin Staining, Mottled, Pallor, Rubor, Erythema. Periwound temperature was noted as No Abnormality. Assessment Active Problems ICD-10 E11.621 - Type 2 diabetes mellitus with foot ulcer E11.42 - Type 2 diabetes mellitus with diabetic polyneuropathy L97.513 - Non-pressure chronic ulcer of other part of right foot with necrosis of muscle Matsumoto, Holdan A. ([id number]) Procedures Wound #1 Wound #1 is a Diabetic Wound/Ulcer of the Lower Extremity located on the Plantar Foot . There was a Skin/Subcutaneous Tissue Debridement [id number]) debridement with total area of 0.15 sq cm performed by Ricard Dillon, MD. with the following instrument(s): Curette to remove Viable and Non-Viable tissue/material including Fibrin/Slough, Callus, and Subcutaneous after achieving pain control using Lidocaine 4% Topical Solution. A time out was conducted at 10:10, prior to the start of the procedure. A Minimum amount of bleeding was controlled with Pressure. The procedure was tolerated well with a  pain level of 0 throughout and a pain level of 0 following the procedure. Post Debridement Measurements: 0.5cm length x 0.3cm width x 0.5cm depth; 0.059cm^3 volume. Character of Wound/Ulcer Post Debridement is stable. Severity of Tissue Post Debridement is: Fat layer exposed. Post procedure Diagnosis Wound #1: Same as Pre-Procedure Plan Wound Cleansing: Wound #1 Plantar Foot: Cleanse wound with mild soap and water May Shower, gently pat wound dry prior to applying new dressing. May shower with protection. Primary Wound Dressing: Wound #1 Plantar Foot: Prisma Ag Secondary Dressing: Wound #1 Plantar Foot: Gauze and Kerlix/Conform Dressing Change Frequency: Wound #1 Plantar Foot: Change dressing every other day. Follow-up Appointments: Wound #1 Plantar Foot: Return Appointment in 1 week. Off-Loading: Wound #1 Plantar Foot: Open toe surgical shoe to: - Front offloader darco Other: - felt Additional Orders / Instructions: Lasseter, Moises A. ([id number]) Wound #1 Plantar Foot: Increase protein intake. Activity as tolerated Continue Prisma, felt, offloading shoe Once again he will not agree to a total contact cast in spite of the fact that this per her description of the nurses almost close this Consider culture next week. Consider further imaging Electronic Signature(s) Signed: 03/22/2016 4:32:31 PM By: Linton Ham MD Entered By: Linton Ham on 03/21/2016 12:23:07 Holroyd, [name] ([id number]) -------------------------------------------------------------------------------- [place] Details Patient Name: Bagent, Alvaro A. Date of Service: 03/21/2016 Medical Record Patient Account Number: [id number] Number: Treating RN: Montey Hora September 23, 1955 (60 y.o. Other Clinician: Date of Birth/Sex: Male) Treating Keren Alverio, Dorrance Primary Care Physician: Viviana Simpler Physician/Extender: G Referring Physician: Marily Memos in Treatment: 14 Diagnosis  Coding ICD-10 Codes Code Description E11.621 Type 2 diabetes mellitus with foot ulcer E11.42 Type 2 diabetes mellitus with diabetic polyneuropathy L97.513 Non-pressure chronic ulcer of other part of right foot with necrosis of muscle Facility Procedures CPT4 Code: [id number] Description: [id number] - DEB SUBQ TISSUE 20 SQ CM/< ICD-10 Description Diagnosis E11.621 Type 2 diabetes mellitus with foot ulcer Modifier: Quantity: 1 Physician Procedures CPT4 Code: [id number] Description: [id number] - WC PHYS SUBQ TISS 20 SQ CM ICD-10 Description Diagnosis E11.621 Type 2 diabetes mellitus with foot ulcer Modifier: Quantity: 1 Electronic Signature(s) Signed: 03/22/2016 4:32:31 PM By: Linton Ham MD Entered By:  Linton Ham on 03/21/2016 12:26:44

## 2016-03-28 ENCOUNTER — Encounter: Payer: 59 | Attending: Internal Medicine | Admitting: Internal Medicine

## 2016-03-28 DIAGNOSIS — E11621 Type 2 diabetes mellitus with foot ulcer: Secondary | ICD-10-CM | POA: Insufficient documentation

## 2016-03-28 DIAGNOSIS — I10 Essential (primary) hypertension: Secondary | ICD-10-CM | POA: Insufficient documentation

## 2016-03-28 DIAGNOSIS — Z794 Long term (current) use of insulin: Secondary | ICD-10-CM | POA: Insufficient documentation

## 2016-03-28 DIAGNOSIS — L97513 Non-pressure chronic ulcer of other part of right foot with necrosis of muscle: Secondary | ICD-10-CM | POA: Insufficient documentation

## 2016-03-28 DIAGNOSIS — M109 Gout, unspecified: Secondary | ICD-10-CM | POA: Insufficient documentation

## 2016-03-28 DIAGNOSIS — E1142 Type 2 diabetes mellitus with diabetic polyneuropathy: Secondary | ICD-10-CM | POA: Insufficient documentation

## 2016-03-29 ENCOUNTER — Other Ambulatory Visit
Admission: RE | Admit: 2016-03-29 | Discharge: 2016-03-29 | Disposition: A | Discharge status: Home / Self Care | Payer: 59 | Source: Ambulatory Visit | Attending: Internal Medicine | Admitting: Internal Medicine

## 2016-03-29 DIAGNOSIS — L089 Local infection of the skin and subcutaneous tissue, unspecified: Secondary | ICD-10-CM | POA: Diagnosis not present

## 2016-03-30 NOTE — Progress Notes (Addendum)
[name] ([id number]) Visit Report for 03/28/2016 Arrival Information Details Patient Name: [name], [name] Date of Service: 03/28/2016 9:15 AM Medical Record Number: [id number] Patient Account Number: [id number] Date of Birth/Sex: 05-02-1956 (60 y.o. Male) Treating RN: Cornell Barman Primary Care Physician: Viviana Simpler Other Clinician: Referring Physician: Viviana Simpler Treating Physician/Extender: Tito Dine in Treatment: 15 Visit Information History Since Last Visit Added or deleted any medications: No Patient Arrived: Ambulatory Any new allergies or adverse reactions: No Arrival Time: 09:18 Had a fall or experienced change in No Accompanied By: self activities of daily living that may affect Transfer Assistance: None risk of falls: Patient Identification Verified: Yes Signs or symptoms of abuse/neglect since last No Secondary Verification Process Yes visito Completed: Hospitalized since last visit: No Patient Requires Transmission- No Has Dressing in Place as Prescribed: Yes Based Precautions: Has Footwear/Offloading in Place as Yes Patient Has Alerts: Yes Prescribed: Patient Alerts: 02/2016 HgbA1c Left: Wedge 6.7 Shoe Pain Present Now: No Electronic Signature(s) Signed: 03/29/2016 5:55:03 PM By: Gretta Cool, RN, BSN, Kim RN, BSN Entered By: Gretta Cool, RN, BSN, Kim on 03/28/2016 09:25:27 Stringfellow, Gerda Diss ([id number]) -------------------------------------------------------------------------------- Encounter Discharge Information Details Patient Name: [name], [name] Date of Service: 03/28/2016 9:15 AM Medical Record Number: [id number] Patient Account Number: [id number] Date of Birth/Sex: [birth year] (60 y.o. Male) Treating RN: Cornell Barman Primary Care Physician: Viviana Simpler Other Clinician: Referring Physician: Viviana Simpler Treating Physician/Extender: Tito Dine in Treatment: 15 Encounter Discharge Information Items Discharge Pain Level:  0 Discharge Condition: Stable Ambulatory Status: Ambulatory Discharge Destination: Home Transportation: Private Auto Accompanied By: self Schedule Follow-up Appointment: Yes Medication Reconciliation completed and provided to Patient/Care Yes Zachary Nole: Provided on Clinical Summary of Care: 03/28/2016 Form Type Recipient Paper Patient JW Electronic Signature(s) Signed: 03/28/2016 10:29:50 AM By: Ruthine Dose Entered By: Ruthine Dose on 03/28/2016 10:29:50 Marquina, Gerda Diss ([id number]) -------------------------------------------------------------------------------- Lower Extremity Assessment Details Patient Name: [name], [name] Date of Service: 03/28/2016 9:15 AM Medical Record Number: [id number] Patient Account Number: [id number] Date of Birth/Sex: [birth year] (60 y.o. Male) Treating RN: Cornell Barman Primary Care Physician: Viviana Simpler Other Clinician: Referring Physician: Viviana Simpler Treating Physician/Extender: Tito Dine in Treatment: 15 Vascular Assessment Pulses: Posterior Tibial Dorsalis Pedis Palpable: [Right:Yes] Extremity colors, hair growth, and conditions: Extremity Color: [Right:Normal] Hair Growth on Extremity: [Right:Yes] Temperature of Extremity: [Right:Warm] Capillary Refill: [Right:< 3 seconds] Dependent Rubor: [Right:No] Blanched when Elevated: [Right:No] Lipodermatosclerosis: [Right:No] Toe Nail Assessment Left: Right: Thick: Yes Discolored: No Deformed: No Improper Length and Hygiene: No Electronic Signature(s) Signed: 03/29/2016 5:55:03 PM By: Gretta Cool, RN, BSN, Kim RN, BSN Entered By: Gretta Cool, RN, BSN, Kim on 03/28/2016 09:23:11 Heatley, Gerda Diss ([id number]) -------------------------------------------------------------------------------- Multi Wound Chart Details Patient Name: [name], [name] Date of Service: 03/28/2016 9:15 AM Medical Record Number: [id number] Patient Account Number: [id number] Date of Birth/Sex: 1955/12/15 (60 y.o.  Male) Treating RN: Cornell Barman Primary Care Physician: Viviana Simpler Other Clinician: Referring Physician: Viviana Simpler Treating Physician/Extender: Tito Dine in Treatment: 15 Vital Signs Height(in): 72 Pulse(bpm): 92 Weight(lbs): 223 Blood Pressure 149/75 (mmHg): Body Mass Index(BMI): 30 Temperature(F): 98.1 Respiratory Rate 16 (breaths/min): Photos: [N/A:N/A] Wound Location: Foot - Plantar N/A N/A Wounding Event: Gradually Appeared N/A N/A Primary Etiology: Diabetic Wound/Ulcer of N/A N/A the Lower Extremity Comorbid History: Hypertension, Type II N/A N/A Diabetes, Gout Date Acquired: 08/03/2015 N/A N/A Weeks of Treatment: 15 N/A N/A Wound Status: Open N/A N/A Measurements L x W x D 0.4x0.3x0.5 N/A N/A (cm) Area (cm) : 0.094  N/A N/A Volume (cm) : 0.047 N/A N/A % Reduction in Area: 94.30% N/A N/A % Reduction in Volume: 92.90% N/A N/A Starting Position 1 12 (o'clock): Ending Position 1 12 (o'clock): Maximum Distance 1 0.1 (cm): Undermining: Yes N/A N/A Classification: Grade 1 N/A N/A [name], [name] ([id number]) Exudate Amount: Small N/A N/A Exudate Type: Serosanguineous N/A N/A Exudate Color: red, brown N/A N/A Wound Margin: Distinct, outline attached N/A N/A Granulation Amount: Large (67-100%) N/A N/A Granulation Quality: Pink, Pale N/A N/A Necrotic Amount: None Present (0%) N/A N/A Exposed Structures: Fascia: No N/A N/A Fat: No Tendon: No Muscle: No Joint: No Bone: No Limited to Skin Breakdown Epithelialization: None N/A N/A Periwound Skin Texture: Callus: Yes N/A N/A Edema: No Excoriation: No Induration: No Crepitus: No Fluctuance: No Friable: No Rash: No Scarring: No Periwound Skin Maceration: No N/A N/A Moisture: Moist: No Dry/Scaly: No Periwound Skin Color: Atrophie Blanche: No N/A N/A Cyanosis: No Ecchymosis: No Erythema: No Hemosiderin Staining: No Mottled: No Pallor: No Rubor: No Temperature: No  Abnormality N/A N/A Tenderness on No N/A N/A Palpation: Wound Preparation: Ulcer Cleansing: N/A N/A Rinsed/Irrigated with Saline Topical Anesthetic Applied: Other: lidocaine 4% Treatment Notes [name] ([id number]) Electronic Signature(s) Signed: 03/29/2016 5:55:03 PM By: Gretta Cool, RN, BSN, Kim RN, BSN Entered By: Gretta Cool, RN, BSN, Kim on 03/28/2016 09:25:00 Solana, Gerda Diss ([id number]) -------------------------------------------------------------------------------- [place] Details Patient Name: Evett, Juvencio A. Date of Service: 03/28/2016 9:15 AM Medical Record Number: [id number] Patient Account Number: [id number] Date of Birth/Sex: [birth year] (60 y.o. Male) Treating RN: Cornell Barman Primary Care Physician: Viviana Simpler Other Clinician: Referring Physician: Viviana Simpler Treating Physician/Extender: Tito Dine in Treatment: 15 Active Inactive Electronic Signature(s) Signed: 05/10/2016 11:39:23 AM By: Gretta Cool RN, BSN, Kim RN, BSN Previous Signature: 03/29/2016 5:55:03 PM Version By: Gretta Cool, RN, BSN, Kim RN, BSN Entered By: Gretta Cool, RN, BSN, Kim on 05/10/2016 11:39:21 Argo, Gerda Diss ([id number]) -------------------------------------------------------------------------------- Pain Assessment Details Patient Name: Gessel, Raad A. Date of Service: 03/28/2016 9:15 AM Medical Record Number: [id number] Patient Account Number: [id number] Date of Birth/Sex: September 25, 1955 (60 y.o. Male) Treating RN: Cornell Barman Primary Care Physician: Viviana Simpler Other Clinician: Referring Physician: Viviana Simpler Treating Physician/Extender: Tito Dine in Treatment: 15 Active Problems Location of Pain Severity and Description of Pain Patient Has Paino No Site Locations With Dressing Change: No Pain Management and Medication Current Pain Management: Electronic Signature(s) Signed: 03/29/2016 5:55:03 PM By: Gretta Cool, RN, BSN, Kim RN, BSN Entered By: Gretta Cool, RN,  BSN, Kim on 03/28/2016 09:19:48 Nevel, Gerda Diss ([id number]) -------------------------------------------------------------------------------- Patient/Caregiver Education Details Patient Name: Bohlken, Chong A. Date of Service: 03/28/2016 9:15 AM Medical Record Number: [id number] Patient Account Number: [id number] Date of Birth/Gender: 1955/11/13 (60 y.o. Male) Treating RN: Cornell Barman Primary Care Physician: Viviana Simpler Other Clinician: Referring Physician: Viviana Simpler Treating Physician/Extender: Tito Dine in Treatment: 15 Education Assessment Education Provided To: Patient Education Topics Provided Wound/Skin Impairment: Handouts: Caring for Your Ulcer Methods: Demonstration, Explain/Verbal Responses: State content correctly Electronic Signature(s) Signed: 03/29/2016 5:55:03 PM By: Gretta Cool, RN, BSN, Kim RN, BSN Entered By: Gretta Cool, RN, BSN, Kim on 03/28/2016 09:45:33 Lefferts, Gerda Diss ([id number]) -------------------------------------------------------------------------------- Wound Assessment Details Patient Name: Manes, Braidyn A. Date of Service: 03/28/2016 9:15 AM Medical Record Number: [id number] Patient Account Number: [id number] Date of Birth/Sex: 12/23/1955 (60 y.o. Male) Treating RN: Cornell Barman Primary Care Physician: Viviana Simpler Other Clinician: Referring Physician: Viviana Simpler Treating Physician/Extender: Tito Dine in Treatment: 15 Wound Status Wound Number: 1 Primary Diabetic  Wound/Ulcer of the Lower Etiology: Extremity Wound Location: Foot - Plantar Wound Status: Open Wounding Event: Gradually Appeared Comorbid Hypertension, Type II Diabetes, Date Acquired: 08/03/2015 History: Gout Weeks Of Treatment: 15 Clustered Wound: No Photos Wound Measurements Length: (cm) 0.4 Width: (cm) 0.3 Depth: (cm) 0.5 Area: (cm) 0.094 Volume: (cm) 0.047 % Reduction in Area: 94.3% % Reduction in Volume: 92.9% Epithelialization:  None Tunneling: No Undermining: Yes Starting Position (o'clock): 12 Ending Position (o'clock): 12 Maximum Distance: (cm) 0.1 Wound Description Classification: Grade 1 Wound Margin: Distinct, outline attached Exudate Amount: Small Exudate Type: Serosanguineous Exudate Color: red, brown Foul Odor After Cleansing: No Wound Bed Granulation Amount: Large (67-100%) Exposed Structure Weist, Alvon A. ([id number]) Granulation Quality: Pink, Pale Fascia Exposed: No Necrotic Amount: None Present (0%) Fat Layer Exposed: No Tendon Exposed: No Muscle Exposed: No Joint Exposed: No Bone Exposed: No Limited to Skin Breakdown Periwound Skin Texture Texture Color No Abnormalities Noted: No No Abnormalities Noted: No Callus: Yes Atrophie Blanche: No Crepitus: No Cyanosis: No Excoriation: No Ecchymosis: No Fluctuance: No Erythema: No Friable: No Hemosiderin Staining: No Induration: No Mottled: No Localized Edema: No Pallor: No Rash: No Rubor: No Scarring: No Temperature / Pain Moisture Temperature: No Abnormality No Abnormalities Noted: No Dry / Scaly: No Maceration: No Moist: No Wound Preparation Ulcer Cleansing: Rinsed/Irrigated with Saline Topical Anesthetic Applied: Other: lidocaine 4%, Electronic Signature(s) Signed: 03/29/2016 5:55:03 PM By: Gretta Cool, RN, BSN, Kim RN, BSN Entered By: Gretta Cool, RN, BSN, Kim on 03/28/2016 09:24:15 Nogueira, Gerda Diss ([id number]) -------------------------------------------------------------------------------- [place] Details Patient Name: Belinsky, Kole A. Date of Service: 03/28/2016 9:15 AM Medical Record Number: [id number] Patient Account Number: [id number] Date of Birth/Sex: [birth year] (60 y.o. Male) Treating RN: Cornell Barman Primary Care Physician: Viviana Simpler Other Clinician: Referring Physician: Viviana Simpler Treating Physician/Extender: Tito Dine in Treatment: 15 Vital Signs Time Taken: 09:19 Temperature (F): 98.1 Height  (in): 72 Pulse (bpm): 92 Weight (lbs): 223 Respiratory Rate (breaths/min): 16 Body Mass Index (BMI): 30.2 Blood Pressure (mmHg): 149/75 Reference Range: 80 - 120 mg / dl Electronic Signature(s) Signed: 03/29/2016 5:55:03 PM By: Gretta Cool, RN, BSN, Kim RN, BSN Entered By: Gretta Cool, RN, BSN, Kim on 03/28/2016 09:20:05

## 2016-03-30 NOTE — Progress Notes (Signed)
Bradley, DARIELL [name] ([id number]) Visit Report for 03/28/2016 Chief Complaint Document Details Patient Name: [name], [name] Date of Service: 03/28/2016 9:15 AM Medical Record Patient Account Number: [id number] Number: Treating RN: Cornell Barman Aug 11, 1955 (60 y.o. Other Clinician: Date of Birth/Sex: Male) Treating Robel Wuertz Primary Care Physician: Viviana Simpler Physician/Extender: G Referring Physician: Marily Memos in Treatment: 15 Information Obtained from: Patient Chief Complaint 12/14/15; this is a 60 year old man who is a type II diabetic with diabetic neuropathy on insulin and metformin. Presents with a nonhealing ulcer on his right foot since January of this year Electronic Signature(s) Signed: 03/29/2016 7:55:14 AM By: Linton Ham MD Entered By: Linton Ham on 03/28/2016 09:55:07 Mielke, Gerda Diss ([id number]) -------------------------------------------------------------------------------- Debridement Details Patient Name: [name], [name] Date of Service: 03/28/2016 9:15 AM Medical Record Patient Account Number: [id number] Number: Treating RN: Cornell Barman 12-13-1955 (60 y.o. Other Clinician: Date of Birth/Sex: Male) Treating Lindalou Soltis Primary Care Physician: Viviana Simpler Physician/Extender: G Referring Physician: Marily Memos in Treatment: 15 Debridement Performed for Wound #1 Plantar Foot Assessment: Performed By: Physician Ricard Dillon, MD Debridement: Debridement Pre-procedure Yes - 09:35 Verification/Time Out Taken: Start Time: 09:36 Pain Control: Other : lidocaine 4% Level: Skin/Subcutaneous Tissue Total Area Debrided (L x 0.4 (cm) x 0.3 (cm) = 0.12 (cm) W): Tissue and other Viable, Non-Viable, Fat, Subcutaneous material debrided: Instrument: Curette Specimen: Swab Number of Specimens 1 Taken: Bleeding: Minimum Hemostasis Achieved: Pressure End Time: 09:40 Procedural Pain: 0 Post Procedural Pain:  0 Response to Treatment: Procedure was tolerated well Post Debridement Measurements of Total Wound Length: (cm) 0.4 Width: (cm) 0.3 Depth: (cm) 0.5 Volume: (cm) 0.047 Character of Wound/Ulcer Post Improved Debridement: Severity of Tissue Post Debridement: Limited to breakdown of skin Post Procedure Diagnosis Same as Pre-procedure Broadhead, [name] ([id number]) Electronic Signature(s) Signed: 03/29/2016 7:55:14 AM By: Linton Ham MD Signed: 03/29/2016 5:55:03 PM By: Gretta Cool RN, BSN, Kim RN, BSN Entered By: Linton Ham on 03/28/2016 09:54:52 Sieling, Gerda Diss ([id number]) -------------------------------------------------------------------------------- HPI Details Patient Name: [name], [name] Date of Service: 03/28/2016 9:15 AM Medical Record Patient Account Number: [id number] Number: Treating RN: Cornell Barman [birth year] (60 y.o. Other Clinician: Date of Birth/Sex: Male) Treating Maegen Wigle Primary Care Physician: Viviana Simpler Physician/Extender: G Referring Physician: Marily Memos in Treatment: 15 History of Present Illness HPI Description: 12/14/15; this is a 60 year old type II diabetic on insulin with diabetic polyneuropathy. He apparently had a callus at roughly his fourth metatarsal head in January he was attempting to remove however he ended up with a ulcer. His wife who was present said they have been using saline to clean this wound and using Polysporin and Betadine- but not making much progress. His previous surgery by his podiatrist Dr. Milinda Pointer in 2014 but does not wish to go back to see Dr. Milinda Pointer and was referred here instead. He has not been on any antibiotics and has not had recent x-rays of the foot. He has no known polyneuropathy and no prior wound history looking [facility] appears that the patient had foot corrective surgery on 04/02/13 this consisted of an [facility] bunion repair on the right second metatarsal, osteotomy of  the right fifth metatarsal, osteotomy of the right hammertoe repair on #2 #3 in #5. Sometime after this he injured the foot he was noted to have fractures with fracture of the first metatarsal osteotomy fracture of the second metatarsal osteotomy dislocation of the third metatarsal phalangeal joint and fracture of the fifth  metatarsal osteotomy site here and I believe he went back to the operating room and had internal fixation of the right foot. He has not had any recent x-rays of the right foot. He had an MRI of the right foot in 2014 last hemoglobin A1c I see is at 7.1 on 10/29/15. He had a normal serum albumin on 04/29/15 of 4.8. He does not have any nutritional issues 12/21/15; x-ray of the foot that showed no acute bony abnormality no radiopaque foreign bodies. Postsurgical changes in the right first metatarsal were stable healing of fractures in the distal second metatarsal, old healed right fifth metatarsal fracture cortical erosions noted in the right third and fourth metatarsals. We have been using Prisma 12/28/15 the area on his right foot at roughly the fourth metatarsal head is unchanged. Still some circumferential callus and nonviable subcutaneous tissue which requires debridement. No obvious infection 01/04/16; wound is unchanged. Notable for less circumferential callus. Still non-viable subcutaneous tissue which requires debridement. No obvious infection. The patient did not come prepared for a total contact cast we will put this on next week 01/12/16; dimensions of the wound unchanged. The patient did not want a total contact cast today but is prepared to talk about this in 2-3 weeks if there is no progression towards resolution. 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 01/18/16; I don't see much change in this wound which is on the right fourth metatarsal head. I have not been able to talk him into a total contact cast. He continues to offload  this with the Darco forefoot offloading boot, scooter at work etc. 02/01/16:no changes. no significant improvement with wound. no reported systemic s/s of infection. 02/22/16 right fourth metatarsal head. Still inferiorly deep wound. Debridement of circumferential callus nonviable subcutaneous tissue and surface slough. This apparently measures slightly smaller although not a major change since the last time I saw him. I have once again brought up the issue of a total contact cast. He works at [employer]. ([id number]) 02/29/2016 -- was to have a TCC applied today but due to social reasons he has opted not to get it done this week and will agree to get it done next week. 03/14/16; the patient had one total contact cast applied, came back in 3 days for his reapplication but did not allow this to be replaced. Per our intake nurse there was a good improvement in the wound volume. I once again talked to him about this he simply will not agree to a total contact cast largely because he couldn't fit his pant leg over it. We have been using Prisma and he is back and is forefoot offloading boot 03/21/16: using prisma 03/28/16; still using Prisma. Depth 0.5 cm Electronic Signature(s) Signed: 03/29/2016 7:55:14 AM By: Linton Ham MD Entered By: Linton Ham on 03/28/2016 09:55:44 Mcdonnell, Gerda Diss ([id number]) -------------------------------------------------------------------------------- Physical Exam Details Patient Name: Spira, Shjon A. Date of Service: 03/28/2016 9:15 AM Medical Record Patient Account Number: [id number] Number: Treating RN: Cornell Barman 1956/06/27 (60 y.o. Other Clinician: Date of Birth/Sex: Male) Treating Yazan Gatling Primary Care Physician: Viviana Simpler Physician/Extender: G Referring Physician: Marily Memos in Treatment: 15 Constitutional Sitting or standing Blood Pressure is within target range for patient.. Pulse regular and within target  range for patient.Marland Kitchen Respirations regular, non-labored and within target range.. Temperature is normal and within the target range for the patient.. Patient's appearance is neat and clean. Appears in no acute  distress. Well nourished and well developed.. Cardiovascular Pedal pulses palpable and strong bilaterally.. Notes Wound exam; depth of this is at best no improvement. Perhaps slightly deeper. This does not go down to bone. There is surface slough and some nonviable subcutaneous tissue removed with a curet. No evidence of ischemia Electronic Signature(s) Signed: 03/29/2016 7:55:14 AM By: Linton Ham MD Entered By: Linton Ham on 03/28/2016 09:57:20 Monarch, Gerda Diss ([id number]) -------------------------------------------------------------------------------- Physician Orders Details Patient Name: Dobbins, Jonthan A. Date of Service: 03/28/2016 9:15 AM Medical Record Patient Account Number: [id number] Number: Treating RN: Cornell Barman Oct 04, 1955 (60 y.o. Other Clinician: Date of Birth/Sex: Male) Treating Suhas Estis Primary Care Physician: Viviana Simpler Physician/Extender: G Referring Physician: Marily Memos in Treatment: 15 Verbal / Phone Orders: No Diagnosis Coding Wound Cleansing Wound #1 Plantar Foot o Cleanse wound with mild soap and water o May Shower, gently pat wound dry prior to applying new dressing. o May shower with protection. Primary Wound Dressing Wound #1 Plantar Foot o Prisma Ag Secondary Dressing Wound #1 Plantar Foot o Gauze and Kerlix/Conform Dressing Change Frequency Wound #1 Plantar Foot o Change dressing every other day. Follow-up Appointments Wound #1 Plantar Foot o Return Appointment in 1 week. 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 o Activity as  tolerated Rocco, RAMOND BOLEYN ([id number]) Electronic Signature(s) Signed: 03/29/2016 7:55:14 AM By: Linton Ham MD Signed: 03/29/2016 5:55:03 PM By: Gretta Cool RN, BSN, Kim RN, BSN Entered By: Gretta Cool, RN, BSN, Kim on 03/28/2016 09:42:38 Goshert, Gerda Diss ([id number]) -------------------------------------------------------------------------------- Problem List Details Patient Name: Ravi, Jamieon A. Date of Service: 03/28/2016 9:15 AM Medical Record Patient Account Number: [id number] Number: Treating RN: Cornell Barman March 15, 1956 (60 y.o. 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 Date of Service: 03/28/2016 9:15 AM Medical Record Patient Account Number: [id number] Number: Treating RN: Cornell Barman [birth year] (60 y.o. Other Clinician: Date of Birth/Sex: Male) Treating Shilo Philipson Primary Care Physician: Viviana Simpler Physician/Extender: G Referring Physician: Marily Memos in Treatment: 15 Subjective Chief Complaint Information obtained from Patient 12/14/15; this is a 60 year old man who is a type II diabetic with diabetic neuropathy on insulin and metformin. Presents with a nonhealing ulcer  on his right foot since January of this year History of Present Illness (HPI) 12/14/15; this is a 60 year old type II diabetic on insulin with diabetic polyneuropathy. He apparently had a callus at roughly his fourth metatarsal head in January he was attempting to remove however he ended up with a ulcer. His wife who was present said they have been using saline to clean this wound and using Polysporin and Betadine- but not making much progress. His previous surgery by his podiatrist Dr. Milinda Pointer in 2014 but does not wish to go back to see Dr. Milinda Pointer and was referred here instead. He has not been  on any antibiotics and has not had recent x-rays of the foot. He has no known polyneuropathy and no prior wound history [facility] it appears that the patient had foot corrective surgery on 04/02/13 this consisted of an [facility] bunion repair on the right second metatarsal, osteotomy of the right fifth metatarsal, osteotomy of the right hammertoe repair on #2 #3 in #5. Sometime after this he injured the foot he was noted to have fractures with fracture of the first metatarsal osteotomy fracture of the second metatarsal osteotomy dislocation of the third metatarsal phalangeal joint and fracture of the fifth metatarsal osteotomy site here and I believe he went back to the operating room and had internal fixation of the right foot. He has not had any recent x-rays of the right foot. He had an MRI of the right foot in 2014 last hemoglobin A1c I see is at 7.1 on 10/29/15. He had a normal serum albumin on 04/29/15 of 4.8. He does not have any nutritional issues 12/21/15; x-ray of the foot that showed no acute bony abnormality no radiopaque foreign bodies. Postsurgical changes in the right first metatarsal were stable healing of fractures in the distal second metatarsal, old healed right fifth metatarsal fracture cortical erosions noted in the right third and fourth metatarsals. We have been using  Prisma 12/28/15 the area on his right foot at roughly the fourth metatarsal head is unchanged. Still some circumferential callus and nonviable subcutaneous tissue which requires debridement. No obvious infection 01/04/16; wound is unchanged. Notable for less circumferential callus. Still non-viable subcutaneous tissue which requires debridement. No obvious infection. The patient did not come prepared for a total contact cast we will put this on next week 01/12/16; dimensions of the wound unchanged. The patient did not want a total contact cast today but is prepared to talk about this in 2-3 weeks if there is no progression towards resolution. 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 01/18/16; I don't see much change in this wound which is on the right fourth metatarsal head. I have not Debold, Obe A. ([id number]) been able to talk him into a total contact cast. He continues to offload this with the Darco forefoot offloading boot, scooter at work etc. 02/01/16:no changes. no significant improvement with wound. no reported systemic s/s of infection. 02/22/16 right fourth metatarsal head. Still inferiorly deep wound. Debridement of circumferential callus nonviable subcutaneous tissue and surface slough. This apparently measures slightly smaller although not a major change since the last time I saw him. I have once again brought up the issue of a total contact cast. He works at [employer] 02/29/2016 -- was to have a TCC applied today but due to social reasons he has opted not to get it done this week and will agree to get it done next week. 03/14/16; the patient had one total contact cast applied, came back in 3 days for his reapplication but did not allow this to be replaced. Per our intake nurse there was a good improvement in the wound volume. I once again talked to him about this he simply will not agree to a total contact cast largely because he couldn't fit his  pant leg over it. We have been using Prisma and he is back and is forefoot offloading boot 03/21/16: using prisma 03/28/16; still using Prisma. Depth 0.5 cm Objective Constitutional Sitting or standing Blood Pressure is within target range for patient.. Pulse regular and within target  range for patient.Marland Kitchen Respirations regular, non-labored and within target range.. Temperature is normal and within the target range for the patient.. Patient's appearance is neat and clean. Appears in no acute distress. Well nourished and well developed.. Vitals Time Taken: 9:19 AM, Height: 72 in, Weight: 223 lbs, BMI: 30.2, Temperature: 98.1 F, Pulse: 92 bpm, Respiratory Rate: 16 breaths/min, Blood Pressure: 149/75 mmHg. Cardiovascular Pedal pulses palpable and strong bilaterally.. General Notes: Wound exam; depth of this is at best no improvement. Perhaps slightly deeper. This does not go down to bone. There is surface slough and some nonviable subcutaneous tissue removed with a curet. No evidence of ischemia Integumentary (Hair, Skin) Wound #1 status is Open. Original cause of wound was Gradually Appeared. The wound is located on the Plantar Foot. The wound measures 0.4cm length x 0.3cm width x 0.5cm depth; 0.094cm^2 area and 0.047cm^3 volume. The wound is limited to skin breakdown. There is no tunneling noted, however, there is undermining starting at 12:00 and ending at 12:00 with a maximum distance of 0.1cm. There is a small amount of serosanguineous drainage noted. The wound margin is distinct with the outline attached to the wound base. There is large (67-100%) pink, pale granulation within the wound bed. There is no necrotic tissue within the wound bed. The periwound skin appearance exhibited: Callus. The periwound skin appearance did not exhibit: Crepitus, Excoriation, Fluctuance, Friable, Induration, Localized Edema, Rash, Stallsmith, Ichiro A. ([id number]) Scarring, Dry/Scaly, Maceration, Moist, Atrophie  Blanche, Cyanosis, Ecchymosis, Hemosiderin Staining, Mottled, Pallor, Rubor, Erythema. Periwound temperature was noted as No Abnormality. Assessment Active Problems ICD-10 E11.621 - Type 2 diabetes mellitus with foot ulcer E11.42 - Type 2 diabetes mellitus with diabetic polyneuropathy L97.513 - Non-pressure chronic ulcer of other part of right foot with necrosis of muscle Procedures Wound #1 Wound #1 is a Diabetic Wound/Ulcer of the Lower Extremity located on the Plantar Foot . There was a Skin/Subcutaneous Tissue Debridement [id number]) debridement with total area of 0.12 sq cm performed by Ricard Dillon, MD. with the following instrument(s): Curette to remove Viable and Non-Viable tissue/material including Fat and Subcutaneous after achieving pain control using Other (lidocaine 4%). 1 Specimen was taken by a Swab and sent to the lab per facility protocol.A time out was conducted at 09:35, prior to the start of the procedure. A Minimum amount of bleeding was controlled with Pressure. The procedure was tolerated well with a pain level of 0 throughout and a pain level of 0 following the procedure. Post Debridement Measurements: 0.4cm length x 0.3cm width x 0.5cm depth; 0.047cm^3 volume. Character of Wound/Ulcer Post Debridement is improved. Severity of Tissue Post Debridement is: Limited to breakdown of skin. Post procedure Diagnosis Wound #1: Same as Pre-Procedure Plan Wound Cleansing: Wound #1 Plantar Foot: Cleanse wound with mild soap and water May Shower, gently pat wound dry prior to applying new dressing. May shower with protection. Primary Wound Dressing: Judy, Bucky A. ([id number]) Wound #1 Plantar Foot: Prisma Ag Secondary Dressing: Wound #1 Plantar Foot: Gauze and Kerlix/Conform Dressing Change Frequency: Wound #1 Plantar Foot: Change dressing every other day. Follow-up Appointments: Wound #1 Plantar Foot: Return Appointment in 1 week. Off-Loading: Wound #1  Plantar Foot: Open toe surgical shoe to: - Front offloader darco Other: - felt Additional Orders / Instructions: Wound #1 Plantar Foot: Increase protein intake. Activity as tolerated #1 continue with Prisma. #2 considering Oasis through his insurance #3 he has consistently refused total contact casting. However if we are going to proceed with Oasis I would really  like to cast this #4 I went ahead and did a culture of this area no antibiotics provided at this point Electronic Signature(s) Signed: 03/29/2016 7:55:14 AM By: Linton Ham MD Entered By: Linton Ham on 03/28/2016 09:58:31 Mifflin, Gerda Diss ([id number]) -------------------------------------------------------------------------------- [place] Details Patient Name: Mcclees, Sony A. Date of Service: 03/28/2016 Medical Record Patient Account Number: [id number] Number: Treating RN: Cornell Barman 1956-02-27 (60 y.o. Other Clinician: Date of Birth/Sex: Male) Treating Chelsye Suhre, Bennett Primary Care Physician: Viviana Simpler Physician/Extender: G Referring Physician: Marily Memos in Treatment: 15 Diagnosis Coding ICD-10 Codes Code Description E11.621 Type 2 diabetes mellitus with foot ulcer E11.42 Type 2 diabetes mellitus with diabetic polyneuropathy L97.513 Non-pressure chronic ulcer of other part of right foot with necrosis of muscle Facility Procedures CPT4 Code: [id number] Description: [id number] - DEB SUBQ TISSUE 20 SQ CM/< ICD-10 Description Diagnosis E11.621 Type 2 diabetes mellitus with foot ulcer Modifier: Quantity: 1 Physician Procedures CPT4 Code: [id number] Description: [id number] - WC PHYS SUBQ TISS 20 SQ CM ICD-10 Description Diagnosis E11.621 Type 2 diabetes mellitus with foot ulcer Modifier: Quantity: 1 Electronic Signature(s) Signed: 03/29/2016 7:55:14 AM By: Linton Ham MD Entered By: Linton Ham on 03/28/2016 10:00:44

## 2016-04-02 LAB — AEROBIC CULTURE W GRAM STAIN (SUPERFICIAL SPECIMEN)

## 2016-04-02 LAB — AEROBIC CULTURE  (SUPERFICIAL SPECIMEN)

## 2016-04-04 ENCOUNTER — Ambulatory Visit: Payer: 59 | Admitting: Internal Medicine

## 2016-04-11 ENCOUNTER — Ambulatory Visit: Payer: 59 | Admitting: Internal Medicine

## 2016-04-18 ENCOUNTER — Ambulatory Visit: Payer: 59 | Admitting: Internal Medicine

## 2016-05-07 ENCOUNTER — Other Ambulatory Visit: Payer: Self-pay | Admitting: Internal Medicine

## 2016-05-12 ENCOUNTER — Encounter: Payer: Self-pay | Admitting: Gastroenterology

## 2016-05-12 ENCOUNTER — Encounter: Payer: Self-pay | Admitting: Internal Medicine

## 2016-05-12 ENCOUNTER — Ambulatory Visit (INDEPENDENT_AMBULATORY_CARE_PROVIDER_SITE_OTHER): Payer: 59 | Admitting: Internal Medicine

## 2016-05-12 ENCOUNTER — Telehealth: Payer: Self-pay | Admitting: Gastroenterology

## 2016-05-12 VITALS — BP 130/84 | HR 91 | Temp 98.1°F | Ht 72.5 in | Wt 222.0 lb

## 2016-05-12 DIAGNOSIS — Z0000 Encounter for general adult medical examination without abnormal findings: Secondary | ICD-10-CM | POA: Diagnosis not present

## 2016-05-12 DIAGNOSIS — E1140 Type 2 diabetes mellitus with diabetic neuropathy, unspecified: Secondary | ICD-10-CM | POA: Diagnosis not present

## 2016-05-12 DIAGNOSIS — K219 Gastro-esophageal reflux disease without esophagitis: Secondary | ICD-10-CM

## 2016-05-12 DIAGNOSIS — E08621 Diabetes mellitus due to underlying condition with foot ulcer: Secondary | ICD-10-CM | POA: Diagnosis not present

## 2016-05-12 DIAGNOSIS — I10 Essential (primary) hypertension: Secondary | ICD-10-CM

## 2016-05-12 DIAGNOSIS — Z794 Long term (current) use of insulin: Secondary | ICD-10-CM

## 2016-05-12 DIAGNOSIS — L97412 Non-pressure chronic ulcer of right heel and midfoot with fat layer exposed: Secondary | ICD-10-CM

## 2016-05-12 DIAGNOSIS — Z23 Encounter for immunization: Secondary | ICD-10-CM

## 2016-05-12 DIAGNOSIS — D369 Benign neoplasm, unspecified site: Secondary | ICD-10-CM

## 2016-05-12 MED ORDER — INSULIN GLARGINE 100 UNIT/ML SOLOSTAR PEN: PEN_INJECTOR | SUBCUTANEOUS | 3 refills | Status: DC

## 2016-05-12 MED ORDER — METFORMIN HCL 1000 MG PO TABS: 1000.0000 mg | ORAL_TABLET | Freq: Two times a day (BID) | ORAL | 3 refills | Status: DC

## 2016-05-12 MED ORDER — OMEPRAZOLE 20 MG PO CPDR: DELAYED_RELEASE_CAPSULE | ORAL | 3 refills | Status: DC

## 2016-05-12 MED ORDER — BD SWAB SINGLE USE REGULAR PADS: MEDICATED_PAD | 3 refills | Status: DC

## 2016-05-12 MED ORDER — SIMVASTATIN 40 MG PO TABS: 40.0000 mg | ORAL_TABLET | Freq: Every day | ORAL | 3 refills | Status: DC

## 2016-05-12 MED ORDER — TRIAMCINOLONE ACETONIDE 0.1 % EX LOTN: 1.0000 "application " | TOPICAL_LOTION | Freq: Three times a day (TID) | CUTANEOUS | 3 refills | Status: DC

## 2016-05-12 MED ORDER — ZOSTER VACCINE LIVE 19400 UNT/0.65ML ~~LOC~~ SUSR: 0.6500 mL | Freq: Once | SUBCUTANEOUS | 0 refills | Status: AC

## 2016-05-12 MED ORDER — HYDROCORTISONE VALERATE 0.2 % EX OINT: 1.0000 "application " | TOPICAL_OINTMENT | Freq: Two times a day (BID) | CUTANEOUS | 3 refills | Status: DC

## 2016-05-12 MED ORDER — FUROSEMIDE 20 MG PO TABS
20.0000 mg | ORAL_TABLET | Freq: Every day | ORAL | 3 refills | Status: DC | PRN
Start: 2016-05-12 — End: 2016-11-17

## 2016-05-12 MED ORDER — GLUCOSE BLOOD VI STRP: ORAL_STRIP | 3 refills | Status: DC

## 2016-05-12 MED ORDER — AMLODIPINE BESYLATE 10 MG PO TABS: 10.0000 mg | ORAL_TABLET | Freq: Every day | ORAL | 3 refills | Status: DC

## 2016-05-12 NOTE — Addendum Note (Signed)
Addended by: Pilar Grammes on: 05/12/2016 08:17 AM   Modules accepted: Orders

## 2016-05-12 NOTE — Assessment & Plan Note (Signed)
Will check PSA after discussion Overdue for colon Prevnar/flu vaccine

## 2016-05-12 NOTE — Assessment & Plan Note (Signed)
Chronic Excellent circulation No infection Discussed supportive care--will always have callous there

## 2016-05-12 NOTE — Assessment & Plan Note (Signed)
Good control on PPI

## 2016-05-12 NOTE — Progress Notes (Signed)
Pre visit review using our clinic review tool, if applicable. No additional management support is needed unless otherwise documented below in the visit note. 

## 2016-05-12 NOTE — Assessment & Plan Note (Signed)
BP Readings from Last 3 Encounters:  05/12/16 130/84  10/29/15 140/90  04/29/15 138/70   Good control

## 2016-05-12 NOTE — Progress Notes (Signed)
Subjective:    Patient ID: [name], male    DOB: September 29, 1955, 60 y.o.   MRN: [id number]  HPI Here for physical  No new concerns Right foot wound is not completely healed. Stopped going to the wound center--felt that the debridements seemed to make it worse. Wearing surgical boot since ulcer Abnormal sensation No ABI--but good pulses  Had wellness A1c test at work in summer--- 6.7% Checks sugars every other day-- 90-140 No hypoglycemic reactions Needs to schedule eye exam  Current Outpatient Prescriptions on File Prior to Visit  Medication Sig Dispense Refill  . Alcohol Swabs (B-D SINGLE USE SWABS REGULAR) PADS Use two times daily 200 each 3  . amLODipine (NORVASC) 10 MG tablet Take 1 tablet by mouth  daily 90 tablet 1  . aspirin 81 MG tablet Take 81 mg by mouth daily.      . B-D UF III MINI PEN NEEDLES 31G X 5 MM MISC Use as directed daily 90 each 3  . calcium gluconate 500 MG tablet Take 500 mg by mouth daily.      . Cinnamon 500 MG capsule Take 500 mg by mouth 2 (two) times daily.      . furosemide (LASIX) 20 MG tablet Take 1 tablet by mouth  daily as needed 90 tablet 1  . hydrocortisone valerate ointment (WEST-CORT) 0.2 % Apply 1 application topically 2 (two) times daily. 60 g 3  . indomethacin (INDOCIN) 25 MG capsule TAKE 1 CAPSULE BY MOUTH 3   TIMES A DAY AS NEEDED 270 capsule 0  . LANTUS SOLOSTAR 100 UNIT/ML Solostar Pen Inject subcutaneously 15  units at bedtime 15 mL 1  . metFORMIN (GLUCOPHAGE) 1000 MG tablet Take 1 tablet (1,000 mg total) by mouth 2 (two) times daily with a meal. 180 tablet 3  . omeprazole (PRILOSEC) 20 MG capsule Take 1 capsule by mouth two times daily before a meal 180 capsule 3  . ONE TOUCH ULTRA TEST test strip Check blood sugar two times daily 200 each 3  . simvastatin (ZOCOR) 40 MG tablet Take 1 tablet (40 mg total) by mouth at bedtime. 90 tablet 3  . triamcinolone lotion (KENALOG) 0.1 % Apply 1 application topically 3 (three) times daily. 60  mL 3   No current facility-administered medications on file prior to visit.     Allergies  Allergen Reactions  . Lisinopril     hyperkalemia  . Glipizide     REACTION: wt. gain, hands tingling  . Ibuprofen     REACTION: unspecified  . Ramipril     REACTION: cough    Past Medical History:  Diagnosis Date  . Adenomatous polyp   . Arthritis   . Bunion 04/02/2013   STATUS POST OP BUN REPAIR  . Diabetes mellitus   . GERD (gastroesophageal reflux disease)   . Gout   . Hammertoe   . Hyperlipidemia   . Hypertension   . Plantar flexed metatarsal   . Psoriasis     Past Surgical History:  Procedure Laterality Date  . FOOT SURGERY Right 08.08.14   BUNIION HAM TOE2,3 PINS ,2ND MET OSTEOTOMY, 5TH MET W/SCREW  . [place]   surgery x2  . TOTAL HIP ARTHROPLASTY     Left hip replacement/femur fx 07/04, Screw removal left hip- [place] 11/01    Family History  Problem Relation Age of Onset  . Diabetes Mother   . Cancer Neg Hx      Review of Systems  Constitutional: Negative for fatigue and unexpected weight change.       No regular exercise Wears seat belt  HENT: Positive for tinnitus. Negative for dental problem, hearing loss and trouble swallowing.        Keeps up with dentist  Eyes: Negative for visual disturbance.       No diplopia or unilateral vision loss  Respiratory: Negative for cough, chest tightness and shortness of breath.   Cardiovascular: Negative for chest pain and palpitations.       Less leg swelling--hasn't needed the furosemide  Gastrointestinal: Negative for abdominal pain, blood in stool, constipation, nausea and vomiting.       Heartburn is controlled  Endocrine: Negative for polydipsia and polyuria.  Genitourinary: Negative for difficulty urinating, frequency and urgency.       No sexual problems  Musculoskeletal: Negative for back pain and joint swelling.       Minor joint pains  Skin:       Psoriasis---uses topical    Allergic/Immunologic: Positive for environmental allergies. Negative for immunocompromised state.       No meds as yet  Neurological: Negative for dizziness, syncope and light-headedness.       Rare sinus headaches  Hematological: Negative for adenopathy. Bruises/bleeds easily.  Psychiatric/Behavioral: Negative for dysphoric mood and sleep disturbance. The patient is not nervous/anxious.        Objective:   Physical Exam  Constitutional: He appears well-developed and well-nourished. No distress.  HENT:  Mouth/Throat: Oropharynx is clear and moist. No oropharyngeal exudate.  Neck: Normal range of motion. Neck supple. No thyromegaly present.  Cardiovascular: Normal rate, regular rhythm, normal heart sounds and intact distal pulses.  Exam reveals no gallop.   No murmur heard. Bounding pedal pulses  Pulmonary/Chest: Effort normal and breath sounds normal. No respiratory distress. He has no wheezes. He has no rales.  Abdominal: Soft. There is no tenderness.  Musculoskeletal: He exhibits no edema.  Lymphadenopathy:    He has no cervical adenopathy.  Neurological:  Decreased sensation in feet  Skin:  Small plantar ulcer on right foot Not inflamed Considerable surrounding callous Small excoriated area laterally (from tape)  Psychiatric: He has a normal mood and affect. His behavior is normal.          Assessment & Plan:

## 2016-05-12 NOTE — Telephone Encounter (Signed)
A user error has taken place.

## 2016-05-12 NOTE — Assessment & Plan Note (Signed)
Seems to still have good control

## 2016-05-12 NOTE — Patient Instructions (Signed)
Please get your diabetic eye exam!! 

## 2016-05-13 ENCOUNTER — Other Ambulatory Visit: Payer: Self-pay | Admitting: Internal Medicine

## 2016-05-13 DIAGNOSIS — R972 Elevated prostate specific antigen [PSA]: Secondary | ICD-10-CM

## 2016-05-13 LAB — CBC WITH DIFFERENTIAL/PLATELET
BASOS ABS: 0.1 10*3/uL (ref 0.0–0.2)
Basos: 1 %
EOS (ABSOLUTE): 0.3 10*3/uL (ref 0.0–0.4)
EOS: 7 %
HEMATOCRIT: 38.7 % (ref 37.5–51.0)
HEMOGLOBIN: 13.7 g/dL (ref 12.6–17.7)
IMMATURE GRANULOCYTES: 0 %
Immature Grans (Abs): 0 10*3/uL (ref 0.0–0.1)
Lymphocytes Absolute: 1.5 10*3/uL (ref 0.7–3.1)
Lymphs: 32 %
MCH: 30.8 pg (ref 26.6–33.0)
MCHC: 35.4 g/dL (ref 31.5–35.7)
MCV: 87 fL (ref 79–97)
MONOCYTES: 11 %
MONOS ABS: 0.5 10*3/uL (ref 0.1–0.9)
NEUTROS PCT: 49 %
Neutrophils Absolute: 2.4 10*3/uL (ref 1.4–7.0)
Platelets: 308 10*3/uL (ref 150–379)
RBC: 4.45 x10E6/uL (ref 4.14–5.80)
RDW: 13.3 % (ref 12.3–15.4)
WBC: 4.8 10*3/uL (ref 3.4–10.8)

## 2016-05-13 LAB — LIPID PANEL
CHOL/HDL RATIO: 1.9 ratio (ref 0.0–5.0)
CHOLESTEROL TOTAL: 154 mg/dL (ref 100–199)
HDL: 82 mg/dL (ref >39)
LDL Calculated: 58 mg/dL (ref 0–99)
TRIGLYCERIDES: 72 mg/dL (ref 0–149)
VLDL Cholesterol Cal: 14 mg/dL (ref 5–40)

## 2016-05-13 LAB — COMPREHENSIVE METABOLIC PANEL
ALK PHOS: 92 IU/L (ref 39–117)
ALT: 35 IU/L (ref 0–44)
AST: 43 IU/L — AB (ref 0–40)
Albumin/Globulin Ratio: 1.8 (ref 1.2–2.2)
Albumin: 4.9 g/dL — ABNORMAL HIGH (ref 3.6–4.8)
BILIRUBIN TOTAL: 0.6 mg/dL (ref 0.0–1.2)
BUN/Creatinine Ratio: 9 — ABNORMAL LOW (ref 10–24)
BUN: 7 mg/dL — AB (ref 8–27)
CHLORIDE: 90 mmol/L — AB (ref 96–106)
CO2: 27 mmol/L (ref 18–29)
CREATININE: 0.77 mg/dL (ref 0.76–1.27)
Calcium: 10.1 mg/dL (ref 8.6–10.2)
GFR calc Af Amer: 114 mL/min/{1.73_m2} (ref >59)
GFR calc non Af Amer: 99 mL/min/{1.73_m2} (ref >59)
GLUCOSE: 98 mg/dL (ref 65–99)
Globulin, Total: 2.7 g/dL (ref 1.5–4.5)
Potassium: 5.4 mmol/L — ABNORMAL HIGH (ref 3.5–5.2)
Sodium: 135 mmol/L (ref 134–144)
Total Protein: 7.6 g/dL (ref 6.0–8.5)

## 2016-05-13 LAB — HEMOGLOBIN A1C
Est. average glucose Bld gHb Est-mCnc: 148 mg/dL
Hgb A1c MFr Bld: 6.8 % — ABNORMAL HIGH (ref 4.8–5.6)

## 2016-05-13 LAB — PSA: PROSTATE SPECIFIC AG, SERUM: 4.8 ng/mL — AB (ref 0.0–4.0)

## 2016-06-23 LAB — HM DIABETES EYE EXAM

## 2016-06-26 ENCOUNTER — Encounter: Payer: Self-pay | Admitting: Internal Medicine

## 2016-06-28 ENCOUNTER — Telehealth: Payer: Self-pay

## 2016-06-28 NOTE — Telephone Encounter (Signed)
Form filled out and faxed back to [facility] for quantity override on omeprazole

## 2016-06-29 ENCOUNTER — Ambulatory Visit (AMBULATORY_SURGERY_CENTER): Payer: Self-pay | Admitting: *Deleted

## 2016-06-29 VITALS — Ht 72.5 in | Wt 217.0 lb

## 2016-06-29 DIAGNOSIS — Z1211 Encounter for screening for malignant neoplasm of colon: Secondary | ICD-10-CM

## 2016-06-29 IMAGING — CR DG FOOT 2V*R*
1 series · 2 of 2 positions shown · non-contrast
Comparison: 08/13/2013.

CLINICAL DATA: Right foot wound.  History of gout.

EXAM:
RIGHT FOOT - 2 VIEW

[Series 1: dg foot 2 views right · 0.14mm/px · 2 of 2 slices shown]
[im 1/2]
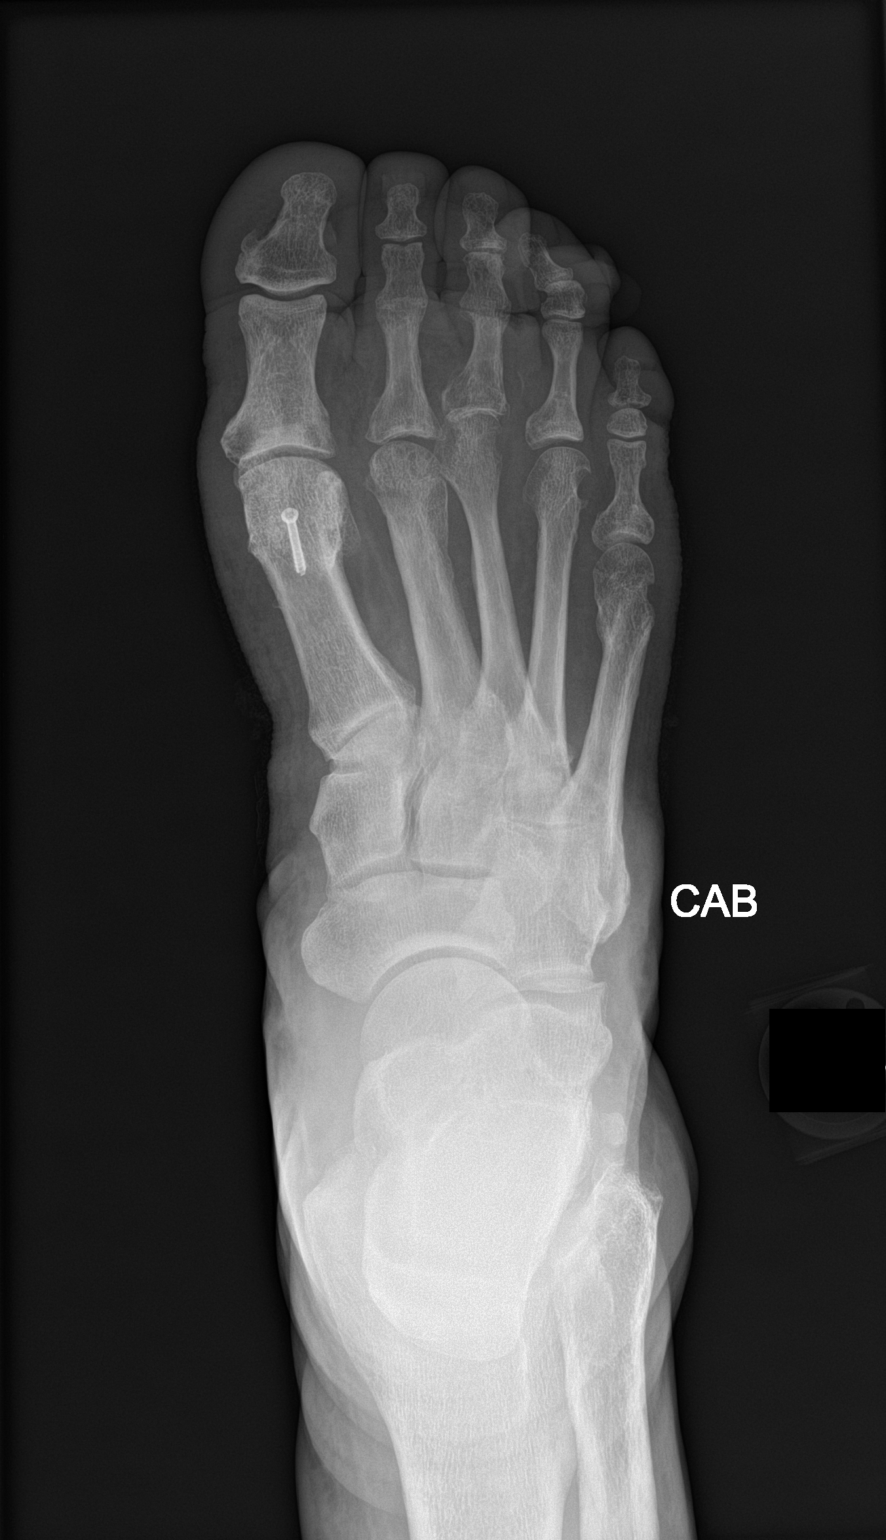
[im 2/2]
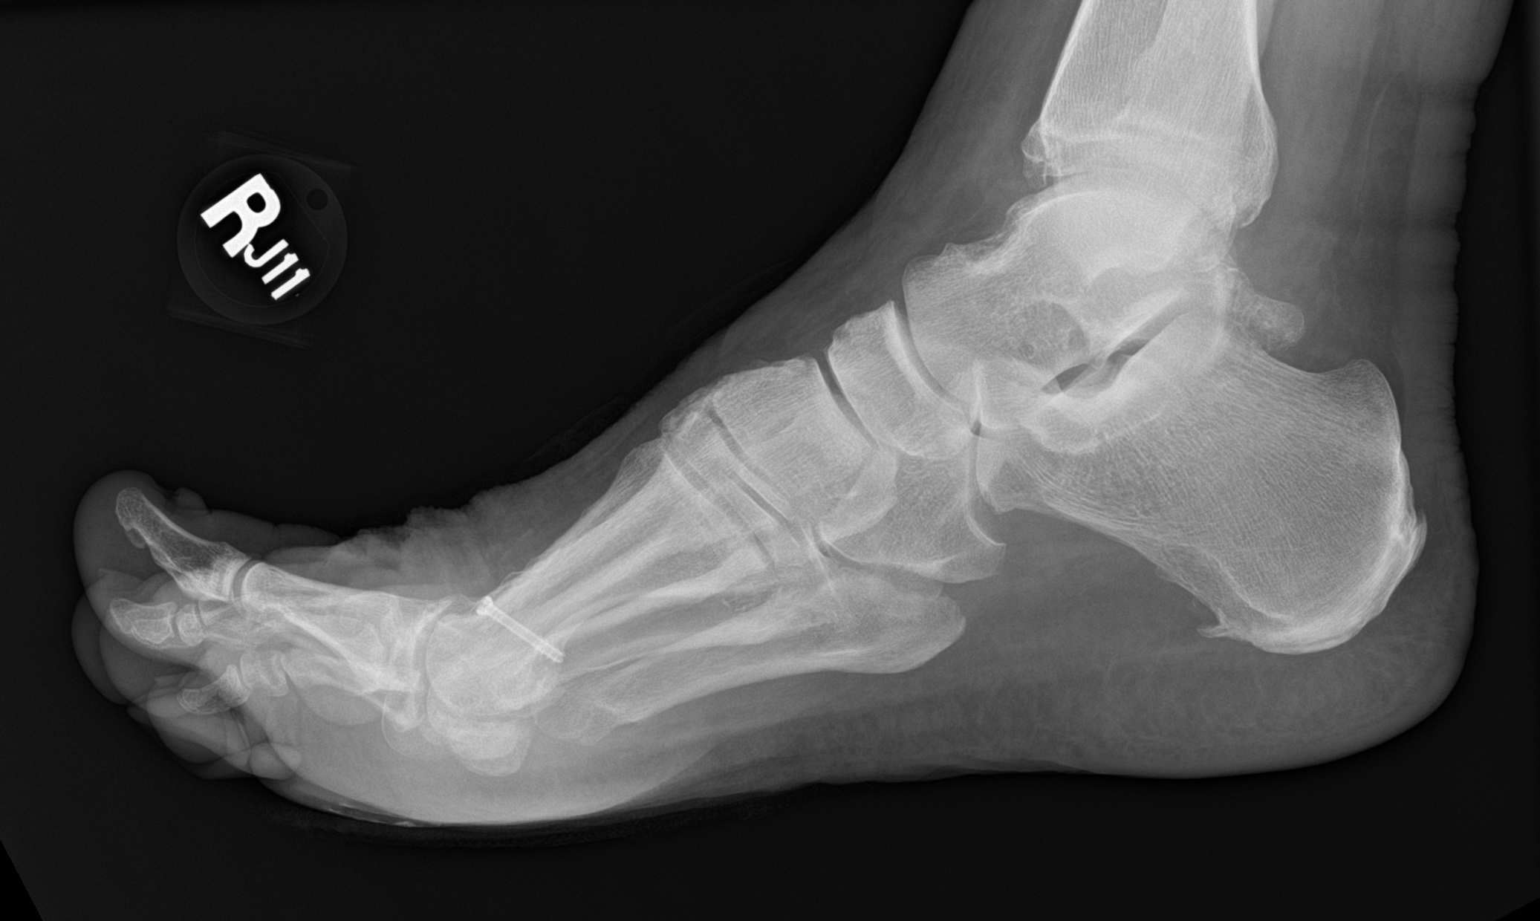

[2 of 2 positions shown; findings below may reference images not displayed]

FINDINGS: Surgical screws noted the first metatarsal. Previously identified
right second metatarsal fracture has healed. Old healed right fifth
metatarsal again noted. Corticated erosions again noted along the
distal aspect of the right third and fourth metatarsal. This may be
related to the patient's prior history of gout. Diffuse degenerative
change. No acute bony abnormality identified.
IMPRESSION: 1. No acute bony abnormality.  No radiopaque foreign bodies.

2. Postsurgical changes right first metatarsal stable. Interval
healing of previously identified distal right second metatarsal
fracture. Old healed right fifth metatarsal fracture again noted.
Corticated erosions again noted along the distal aspect of the right
third and fourth metatarsals. This may be related to patient's prior
history of gout. Diffuse degenerative change.

## 2016-06-29 MED ORDER — NA SULFATE-K SULFATE-MG SULF 17.5-3.13-1.6 GM/177ML PO SOLN: 1.0000 | Freq: Once | ORAL | 0 refills | Status: AC

## 2016-06-29 NOTE — Progress Notes (Signed)
No egg or soy allergy known to patient  No issues with past sedation with any surgeries  or procedures, no intubation problems  No diet pills per patient No home 02 use per patient  No blood thinners per patient  Pt denies issues with constipation  No A fib or A flutter   emmi declined'   

## 2016-06-29 NOTE — Telephone Encounter (Signed)
Medication approved until 06-26-2021.

## 2016-06-30 ENCOUNTER — Encounter: Payer: Self-pay | Admitting: Gastroenterology

## 2016-06-30 ENCOUNTER — Other Ambulatory Visit: Payer: Self-pay | Admitting: Internal Medicine

## 2016-07-12 ENCOUNTER — Telehealth: Payer: Self-pay | Admitting: Gastroenterology

## 2016-07-12 NOTE — Telephone Encounter (Signed)
Not this time 

## 2016-07-13 ENCOUNTER — Encounter: Payer: 59 | Admitting: Gastroenterology

## 2016-07-28 ENCOUNTER — Ambulatory Visit (AMBULATORY_SURGERY_CENTER): Payer: 59 | Admitting: Gastroenterology

## 2016-07-28 ENCOUNTER — Encounter: Payer: Self-pay | Admitting: Gastroenterology

## 2016-07-28 VITALS — BP 145/94 | HR 84 | Temp 97.5°F | Resp 13 | Ht 72.5 in | Wt 217.0 lb

## 2016-07-28 DIAGNOSIS — Z1211 Encounter for screening for malignant neoplasm of colon: Secondary | ICD-10-CM | POA: Diagnosis present

## 2016-07-28 DIAGNOSIS — Z1212 Encounter for screening for malignant neoplasm of rectum: Secondary | ICD-10-CM

## 2016-07-28 DIAGNOSIS — K635 Polyp of colon: Secondary | ICD-10-CM

## 2016-07-28 DIAGNOSIS — D125 Benign neoplasm of sigmoid colon: Secondary | ICD-10-CM

## 2016-07-28 DIAGNOSIS — E11621 Type 2 diabetes mellitus with foot ulcer: Secondary | ICD-10-CM | POA: Diagnosis not present

## 2016-07-28 DIAGNOSIS — K219 Gastro-esophageal reflux disease without esophagitis: Secondary | ICD-10-CM | POA: Diagnosis not present

## 2016-07-28 LAB — GLUCOSE, CAPILLARY
GLUCOSE-CAPILLARY: 79 mg/dL (ref 65–99)
GLUCOSE-CAPILLARY: 89 mg/dL (ref 65–99)
GLUCOSE-CAPILLARY: 98 mg/dL (ref 65–99)

## 2016-07-28 MED ORDER — SODIUM CHLORIDE 0.9 % IV SOLN: 500.0000 mL | INTRAVENOUS | Status: DC

## 2016-07-28 NOTE — Progress Notes (Signed)
Report to PACU, RN, vss, BBS= Clear.  

## 2016-07-28 NOTE — Progress Notes (Signed)
Called to room to assist during endoscopic procedure.  Patient ID and intended procedure confirmed with present staff. Received instructions for my participation in the procedure from the performing physician.  

## 2016-07-28 NOTE — Op Note (Signed)
[place] Patient Name: [name] Procedure Date: 07/28/2016 9:42 AM MRN: [id number] Endoscopist: Mallie Mussel L. Loletha Carrow , MD Age: 61 Referring MD:  Date of Birth: 03/14/1956 Gender: Male Account #: [id number] Procedure:                Colonoscopy Indications:              Screening for colorectal malignant neoplasm (no                            adenomatous polyps on Jan, 2008 colonoscopy) Medicines:                Monitored Anesthesia Care Procedure:                Pre-Anesthesia Assessment:                           - Prior to the procedure, a History and Physical                            was performed, and patient medications and                            allergies were reviewed. The patient's tolerance of                            previous anesthesia was also reviewed. The risks                            and benefits of the procedure and the sedation                            options and risks were discussed with the patient.                            All questions were answered, and informed consent                            was obtained. Anticoagulants: The patient has taken                            aspirin. It was decided not to withhold this                            medication prior to the procedure. ASA Grade                            Assessment: III - A patient with severe systemic                            disease. After reviewing the risks and benefits,                            the patient was deemed in satisfactory condition to  undergo the procedure.                           After obtaining informed consent, the colonoscope                            was passed under direct vision. Throughout the                            procedure, the patient's blood pressure, pulse, and                            oxygen saturations were monitored continuously. The                            Model CF-HQ190L [id number]) scope was  introduced                            through the anus and advanced to the the cecum,                            identified by appendiceal orifice and ileocecal                            valve. The colonoscopy was performed without                            difficulty. The patient tolerated the procedure                            well. The quality of the bowel preparation was good                            (after lavage). The ileocecal valve, appendiceal                            orifice, and rectum were photographed. The bowel                            preparation used was SUPREP. Scope In: 9:50:47 AM Scope Out: 10:04:43 AM Scope Withdrawal Time: 0 hours 11 minutes 16 seconds  Total Procedure Duration: 0 hours 13 minutes 56 seconds  Findings:                 The perianal and digital rectal examinations were                            normal.                           A 2 mm polyp was found in the mid sigmoid colon.                            The polyp was sessile. The polyp was removed with a  cold biopsy forceps. Resection and retrieval were                            complete.                           Multiple medium-mouthed diverticula were found in                            the left colon.                           Internal hemorrhoids were found during                            retroflexion. The hemorrhoids were small and Grade                            I (internal hemorrhoids that do not prolapse).                           The exam was otherwise without abnormality. Complications:            No immediate complications. Estimated Blood Loss:     Estimated blood loss: none. Impression:               - One 2 mm polyp in the mid sigmoid colon, removed                            with a cold biopsy forceps. Resected and retrieved.                           - Diverticulosis in the left colon.                           - Internal hemorrhoids.                            - The examination was otherwise normal. Recommendation:           - Patient has a contact number available for                            emergencies. The signs and symptoms of potential                            delayed complications were discussed with the                            patient. Return to normal activities tomorrow.                            Written discharge instructions were provided to the                            patient.                           -  Resume previous diet.                           - Continue present medications.                           - Await pathology results.                           - Repeat colonoscopy is recommended for                            surveillance. The colonoscopy date will be                            determined after pathology results from today's                            exam become available for review. Henry L. Loletha Carrow, MD 07/28/2016 10:08:49 AM This report has been signed electronically.

## 2016-07-28 NOTE — Patient Instructions (Signed)
YOU HAD AN ENDOSCOPIC PROCEDURE TODAY AT [facility]:   Refer to the procedure report that was given to you for any specific questions about what was found during the examination.  If the procedure report does not answer your questions, please call your gastroenterologist to clarify.  If you requested that your care partner not be given the details of your procedure findings, then the procedure report has been included in a sealed envelope for you to review at your convenience later.  YOU SHOULD EXPECT: Some feelings of bloating in the abdomen. Passage of more gas than usual.  Walking can help get rid of the air that was put into your GI tract during the procedure and reduce the bloating. If you had a lower endoscopy (such as a colonoscopy or flexible sigmoidoscopy) you may notice spotting of blood in your stool or on the toilet paper. If you underwent a bowel prep for your procedure, you may not have a normal bowel movement for a few days.  Please Note:  You might notice some irritation and congestion in your nose or some drainage.  This is from the oxygen used during your procedure.  There is no need for concern and it should clear up in a day or so.  SYMPTOMS TO REPORT IMMEDIATELY:   Following lower endoscopy (colonoscopy or flexible sigmoidoscopy):  Excessive amounts of blood in the stool  Significant tenderness or worsening of abdominal pains  Swelling of the abdomen that is new, acute  Fever of 100F or higher  For urgent or emergent issues, a gastroenterologist can be reached at any hour by calling [phone].   DIET:  We do recommend a small meal at first, but then you may proceed to your regular diet.  Drink plenty of fluids but you should avoid alcoholic beverages for 24 hours.  ACTIVITY:  You should plan to take it easy for the rest of today and you should NOT DRIVE or use heavy machinery until tomorrow (because of the sedation medicines used during the test).     FOLLOW UP: Our staff will call the number listed on your records the next business day following your procedure to check on you and address any questions or concerns that you may have regarding the information given to you following your procedure. If we do not reach you, we will leave a message.  However, if you are feeling well and you are not experiencing any problems, there is no need to return our call.  We will assume that you have returned to your regular daily activities without incident.  If any biopsies were taken you will be contacted by phone or by letter within the next 1-3 weeks.  Please call us at [phone] if you have not heard about the biopsies in 3 weeks.   Await for biopsy results to determined next Colonoscopy Diverticulosis (handout given) Polyps (handout given) Hemorrhoids (handout given)   SIGNATURES/CONFIDENTIALITY: You and/or your care partner have signed paperwork which will be entered into your electronic medical record.  These signatures attest to the fact that that the information above on your After Visit Summary has been reviewed and is understood.  Full responsibility of the confidentiality of this discharge information lies with you and/or your care-partner.

## 2016-07-31 ENCOUNTER — Telehealth: Payer: Self-pay

## 2016-07-31 NOTE — Telephone Encounter (Signed)
  Follow up Call-  Call back number 07/28/2016  Post procedure Call Back phone  # [phone]  Permission to leave phone message Yes  Some recent data might be hidden     Patient questions:  Do you have a fever, pain , or abdominal swelling? No. Pain Score  0 *  Have you tolerated food without any problems? Yes.    Have you been able to return to your normal activities? Yes.    Do you have any questions about your discharge instructions: Diet   No. Medications  No. Follow up visit  No.  Do you have questions or concerns about your Care? No.  Actions: * If pain score is 4 or above: No action needed, pain <4.

## 2016-08-02 ENCOUNTER — Encounter: Payer: Self-pay | Admitting: Gastroenterology

## 2016-08-12 ENCOUNTER — Other Ambulatory Visit: Payer: Self-pay | Admitting: Internal Medicine

## 2016-08-22 ENCOUNTER — Other Ambulatory Visit: Payer: 59

## 2016-10-16 ENCOUNTER — Telehealth: Payer: Self-pay

## 2016-10-16 NOTE — Telephone Encounter (Signed)
Per Dr Silvio Pate, Pt is past due to come in and have his PSA lab work. I left a message for pt to call the office to set up a lab appt.

## 2016-11-10 ENCOUNTER — Ambulatory Visit: Payer: 59 | Admitting: Internal Medicine

## 2016-11-17 ENCOUNTER — Other Ambulatory Visit: Payer: Self-pay | Admitting: Internal Medicine

## 2016-12-16 ENCOUNTER — Ambulatory Visit (INDEPENDENT_AMBULATORY_CARE_PROVIDER_SITE_OTHER): Payer: 59 | Admitting: Family Medicine

## 2016-12-16 ENCOUNTER — Encounter: Payer: Self-pay | Admitting: Family Medicine

## 2016-12-16 VITALS — BP 150/88 | HR 80 | Temp 98.6°F | Resp 16 | Ht 72.6 in | Wt 217.0 lb

## 2016-12-16 DIAGNOSIS — E1140 Type 2 diabetes mellitus with diabetic neuropathy, unspecified: Secondary | ICD-10-CM | POA: Diagnosis not present

## 2016-12-16 DIAGNOSIS — L03119 Cellulitis of unspecified part of limb: Secondary | ICD-10-CM | POA: Diagnosis not present

## 2016-12-16 DIAGNOSIS — I10 Essential (primary) hypertension: Secondary | ICD-10-CM | POA: Diagnosis not present

## 2016-12-16 DIAGNOSIS — Z794 Long term (current) use of insulin: Secondary | ICD-10-CM | POA: Diagnosis not present

## 2016-12-16 DIAGNOSIS — L02619 Cutaneous abscess of unspecified foot: Secondary | ICD-10-CM | POA: Diagnosis not present

## 2016-12-16 DIAGNOSIS — IMO0002 Reserved for concepts with insufficient information to code with codable children: Secondary | ICD-10-CM

## 2016-12-16 MED ORDER — SULFAMETHOXAZOLE-TRIMETHOPRIM 800-160 MG PO TABS: 1.0000 | ORAL_TABLET | Freq: Two times a day (BID) | ORAL | 0 refills | Status: DC

## 2016-12-16 NOTE — Progress Notes (Signed)
ACUTE VISIT:  Chief Complaint  Patient presents with  . right foot pain    redness and swelling,warm to touch    HPI  [name] is a 61 y.o. male, who is here today with his wife complaining of 2 weeks of right foot pain.  He has Hx of DM II with peripheral neuropathy.  Has had chronic problems/pain with right foot for a year and a couple of surgeries. Pain has been worse for the past 2 weeks, also erythema on anterior/dorsal aspect of foot. Pain is "pretty painful", sharp, exacerbated by walking, he has no pain at rest. + Numbness and tingling stable. He is taking Aleve for pain.  He denies fever,chills, myalgias,or recent trauma. BS's "good."  Lab Results  Component Value Date   HGBA1C 6.8 (H) 05/12/2016     He has appt with ortho 01/31/17.   HTN:  His BP elevated today, he has not taken his BP med today. He does not check BP at home. Denies severe/frequent headache, visual changes, chest pain, dyspnea, palpitation, claudication, focal weakness, or edema.   Review of Systems  Constitutional: Positive for fatigue. Negative for activity change, appetite change, fever and unexpected weight change.  Respiratory: Negative for shortness of breath and wheezing.   Cardiovascular: Negative for chest pain and palpitations.  Gastrointestinal: Negative for abdominal pain, nausea and vomiting.  Endocrine: Negative for polydipsia, polyphagia and polyuria.  Genitourinary: Negative for decreased urine volume and hematuria.  Musculoskeletal: Positive for arthralgias and joint swelling.  Skin: Positive for color change. Negative for wound.  Neurological: Positive for numbness. Negative for weakness and headaches.  Psychiatric/Behavioral: Negative for confusion. The patient is nervous/anxious.       Current Outpatient Prescriptions on File Prior to Visit  Medication Sig Dispense Refill  . Alcohol Swabs (B-D SINGLE USE SWABS REGULAR) PADS Use two times daily 200  each 3  . amLODipine (NORVASC) 10 MG tablet TAKE 1 TABLET BY MOUTH  DAILY 90 tablet 1  . aspirin 81 MG tablet Take 81 mg by mouth daily.      . B-D UF III MINI PEN NEEDLES 31G X 5 MM MISC Use as directed daily 90 each 3  . calcium gluconate 500 MG tablet Take 500 mg by mouth daily.      . Cinnamon 500 MG capsule Take 500 mg by mouth 2 (two) times daily.      . furosemide (LASIX) 20 MG tablet TAKE 1 TABLET BY MOUTH  DAILY AS NEEDED 90 tablet 0  . hydrocortisone valerate ointment (WEST-CORT) 0.2 % Apply 1 application topically 2 (two) times daily. 60 g 3  . indomethacin (INDOCIN) 25 MG capsule TAKE 1 CAPSULE BY MOUTH 3   TIMES A DAY AS NEEDED 270 capsule 0  . LANTUS SOLOSTAR 100 UNIT/ML Solostar Pen INJECT SUBCUTANEOUSLY 15  UNITS AT BEDTIME 15 mL 1  . metFORMIN (GLUCOPHAGE) 1000 MG tablet TAKE 1 TABLET BY MOUTH TWO  TIMES DAILY WITH MEALS 180 tablet 1  . omeprazole (PRILOSEC) 20 MG capsule TAKE 1 CAPSULE BY MOUTH TWO TIMES DAILY BEFORE MEALS 180 capsule 0  . ONE TOUCH ULTRA TEST test strip CHECK BLOOD SUGAR TWO TIMES DAILY 200 each 0  . simvastatin (ZOCOR) 40 MG tablet Take 1 tablet (40 mg total) by mouth at bedtime. 90 tablet 3  . triamcinolone lotion (KENALOG) 0.1 % Apply 1 application topically 3 (three) times daily. 60 mL 3   Current Facility-Administered Medications on File Prior  to Visit  Medication Dose Route Frequency Provider Last Rate Last Dose  . 0.9 %  sodium chloride infusion  500 mL Intravenous Continuous Doran Stabler, MD         Past Medical History:  Diagnosis Date  . Adenomatous polyp   . Allergy   . Arthritis    feet   . Bunion 04/02/2013   STATUS POST OP BUN REPAIR  . Diabetes mellitus   . GERD (gastroesophageal reflux disease)   . Gout   . Hammertoe   . Hyperlipidemia   . Hypertension   . Neuromuscular disorder (HCC)    neuropathy  . Plantar flexed metatarsal   . Psoriasis    Allergies  Allergen Reactions  . Lisinopril     hyperkalemia  .  Glipizide     REACTION: wt. gain, hands tingling  . Ibuprofen     REACTION: unspecified  . Ramipril     REACTION: cough    Social History   Social History  . Marital status: Married    Spouse name: N/A  . Number of children: 3  . Years of education: N/A   Occupational History  . 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 Smoking status: Never Smoker  . Smokeless tobacco: Never Used  . Alcohol use Yes     Comment: occasional (DWI 1991)  . Drug use: No  . Sexual activity: Not Asked   Other Topics Concern  . None   Social History Narrative        Vitals:   12/16/16 0902  BP: (!) 150/88  Pulse: 80  Resp: 16  Temp: 98.6 F (37 C)  O2 sat at RA 98% Body mass index is 28.95 kg/m.   Physical Exam  Nursing note and vitals reviewed. Constitutional: He is oriented to person, place, and time. He appears well-developed. No distress.  HENT:  Head: Atraumatic.  Eyes: Conjunctivae and EOM are normal.  Cardiovascular: Normal rate and regular rhythm.   No murmur heard. DP pulses present.  Respiratory: Effort normal and breath sounds normal. No respiratory distress.  Musculoskeletal: He exhibits edema and tenderness.       Feet:  Neurological: He is alert and oriented to person, place, and time. He has normal strength. Gait normal.  Skin: Skin is warm. Rash is not vesicular. There is erythema.  Right foot: dorsal anterior aspect with erythema (about 4-5 cm) ,induration, local heat,mild tenderness. There is a fluctuant area on distal tarsus under 3rd MTP. See graphic details in [place].  Psychiatric: His mood appears anxious.  Well groomed, good eye contact.    ASSESSMENT AND PLAN:   Carver was seen today for right foot pain.  Diagnoses and all orders for this visit:  Abscess or cellulitis of foot  Because the localization I do not feel comfortable trying I&D, recommend going to the ER but he does not want to do so. We discussed the risk of  complications and also the possibility of osteomyelitis, explained that imaging and stat labs can be done in the ER. LE elevation. Clearly instructed about warning signs. Bactrim side effects discussed.  -     sulfamethoxazole-trimethoprim (BACTRIM DS,SEPTRA DS) 800-160 MG tablet; Take 1 tablet by mouth 2 (two) times daily.  Controlled type 2 diabetes mellitus with diabetic neuropathy, with long-term current use of insulin ([place])  General foot care discussed. No changes in current management. Needs to re-schedule f/u appt.  Essential hypertension, benign  Elevated today. Some  side effects of NSAID's discussed. Possible complications of elevated BP discussed. Monitor BP at home F/U with PCP.     Return in about 3 days (around 12/19/2016).  25 min face to face OV. > 50% was dedicated to discussion of Dx, prognosis, treatment options, possible complications.Marland Kitchen His wife is very concerned, she has questions mainly about ortho appt and risk of compications. He has not been compliant  with follow up visits., he has not had labs since 04/2016, there are some future labs that were not done. He refused to go to ER.    -Mr.Cannen Dupras Tudor advised to seek immediate medical attention is symptoms suddenly get worse, otherwise he needs to follow with PCP in 3-4 days.     Betty G. Martinique, MD  [facility]. [place] office.

## 2016-12-16 NOTE — Progress Notes (Signed)
Pre visit review using our clinic review tool, if applicable. No additional management support is needed unless otherwise documented below in the visit note. 

## 2016-12-16 NOTE — Patient Instructions (Addendum)
  [name] I have seen you today for an acute visit.  A few things to remember from today's visit:   Abscess or cellulitis of foot - Plan: sulfamethoxazole-trimethoprim (BACTRIM DS,SEPTRA DS) 800-160 MG tablet  Controlled type 2 diabetes mellitus with diabetic neuropathy, with long-term current use of insulin (HCC)      MUST go to ER if any worsening symptoms. Follow with PCP next week.  Monitor blood pressure at home.  Medications prescribed today are intended for short period of time and will not be refill upon request, a follow up appointment might be necessary to discuss continuation of of treatment if appropriate.     In general please monitor for signs of worsening symptoms and seek immediate medical attention if any concerning.  Please be sure you have an appointment already scheduled with your PCP before you leave today.

## 2016-12-21 ENCOUNTER — Ambulatory Visit (INDEPENDENT_AMBULATORY_CARE_PROVIDER_SITE_OTHER): Payer: 59 | Admitting: Internal Medicine

## 2016-12-21 ENCOUNTER — Encounter: Payer: Self-pay | Admitting: Internal Medicine

## 2016-12-21 ENCOUNTER — Ambulatory Visit (INDEPENDENT_AMBULATORY_CARE_PROVIDER_SITE_OTHER)
Admission: RE | Admit: 2016-12-21 | Discharge: 2016-12-21 | Disposition: A | Discharge status: Home / Self Care | Payer: 59 | Source: Ambulatory Visit | Attending: Internal Medicine | Admitting: Internal Medicine

## 2016-12-21 VITALS — BP 122/84 | HR 89 | Temp 98.3°F | Wt 217.0 lb

## 2016-12-21 DIAGNOSIS — L02619 Cutaneous abscess of unspecified foot: Secondary | ICD-10-CM

## 2016-12-21 DIAGNOSIS — L03115 Cellulitis of right lower limb: Secondary | ICD-10-CM

## 2016-12-21 DIAGNOSIS — L03119 Cellulitis of unspecified part of limb: Secondary | ICD-10-CM | POA: Diagnosis not present

## 2016-12-21 DIAGNOSIS — L97519 Non-pressure chronic ulcer of other part of right foot with unspecified severity: Secondary | ICD-10-CM | POA: Diagnosis not present

## 2016-12-21 DIAGNOSIS — IMO0002 Reserved for concepts with insufficient information to code with codable children: Secondary | ICD-10-CM

## 2016-12-21 MED ORDER — SULFAMETHOXAZOLE-TRIMETHOPRIM 800-160 MG PO TABS: 1.0000 | ORAL_TABLET | Freq: Two times a day (BID) | ORAL | 0 refills | Status: AC

## 2016-12-21 NOTE — Progress Notes (Signed)
Subjective:    Patient ID: [name], male    DOB: 10-10-1955, 61 y.o.   MRN: [id number]  HPI Here for follow up of apparent infection in foot  Started with redness 10-14 days Then it got swollen Declined ER evaluation--- now almost done with the septra Clearly better--2nd toe is almost normal now. 3rd toe is clearly better  Had infection in ulcer on bottom of foot about a year ago Now mostly healed Occasionally gets some drainage  Current Outpatient Prescriptions on File Prior to Visit  Medication Sig Dispense Refill  . Alcohol Swabs (B-D SINGLE USE SWABS REGULAR) PADS Use two times daily 200 each 3  . amLODipine (NORVASC) 10 MG tablet TAKE 1 TABLET BY MOUTH  DAILY 90 tablet 1  . aspirin 81 MG tablet Take 81 mg by mouth daily.      . B-D UF III MINI PEN NEEDLES 31G X 5 MM MISC Use as directed daily 90 each 3  . calcium gluconate 500 MG tablet Take 500 mg by mouth daily.      . Cinnamon 500 MG capsule Take 500 mg by mouth 2 (two) times daily.      . furosemide (LASIX) 20 MG tablet TAKE 1 TABLET BY MOUTH  DAILY AS NEEDED 90 tablet 0  . hydrocortisone valerate ointment (WEST-CORT) 0.2 % Apply 1 application topically 2 (two) times daily. 60 g 3  . indomethacin (INDOCIN) 25 MG capsule TAKE 1 CAPSULE BY MOUTH 3   TIMES A DAY AS NEEDED 270 capsule 0  . LANTUS SOLOSTAR 100 UNIT/ML Solostar Pen INJECT SUBCUTANEOUSLY 15  UNITS AT BEDTIME 15 mL 1  . metFORMIN (GLUCOPHAGE) 1000 MG tablet TAKE 1 TABLET BY MOUTH TWO  TIMES DAILY WITH MEALS 180 tablet 1  . omeprazole (PRILOSEC) 20 MG capsule TAKE 1 CAPSULE BY MOUTH TWO TIMES DAILY BEFORE MEALS 180 capsule 0  . ONE TOUCH ULTRA TEST test strip CHECK BLOOD SUGAR TWO TIMES DAILY 200 each 0  . simvastatin (ZOCOR) 40 MG tablet Take 1 tablet (40 mg total) by mouth at bedtime. 90 tablet 3  . sulfamethoxazole-trimethoprim (BACTRIM DS,SEPTRA DS) 800-160 MG tablet Take 1 tablet by mouth 2 (two) times daily. 14 tablet 0  . triamcinolone lotion (KENALOG)  0.1 % Apply 1 application topically 3 (three) times daily. 60 mL 3   Current Facility-Administered Medications on File Prior to Visit  Medication Dose Route Frequency Provider Last Rate Last Dose  . 0.9 %  sodium chloride infusion  500 mL Intravenous Continuous Doran Stabler, MD        Allergies  Allergen Reactions  . Lisinopril     hyperkalemia  . Glipizide     REACTION: wt. gain, hands tingling  . Ibuprofen     REACTION: unspecified  . Ramipril     REACTION: cough    Past Medical History:  Diagnosis Date  . Adenomatous polyp   . Allergy   . Arthritis    feet   . Bunion 04/02/2013   STATUS POST OP BUN REPAIR  . Diabetes mellitus   . GERD (gastroesophageal reflux disease)   . Gout   . Hammertoe   . Hyperlipidemia   . Hypertension   . Neuromuscular disorder (HCC)    neuropathy  . Plantar flexed metatarsal   . Psoriasis     Past Surgical History:  Procedure Laterality Date  . COLONOSCOPY    . FOOT SURGERY Right 02/28/2013   BUNIION HAM TOE2,3 PINS ,2ND  MET OSTEOTOMY, 5TH MET W/SCREW   02-2013, [id number]  . [place]   surgery x2  . POLYPECTOMY    . TOTAL HIP ARTHROPLASTY     Left hip replacement/femur fx 07/04, Screw removal left hip- [facility] 11/01    Family History  Problem Relation Age of Onset  . Diabetes Mother   . Cancer Neg Hx   . Colon cancer Neg Hx   . Colon polyps Neg Hx   . Rectal cancer Neg Hx   . Stomach cancer Neg Hx     Social History   Social History  . Marital status: Married    Spouse name: N/A  . Number of children: 3  . Years of education: N/A   Occupational History  . 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 Smoking status: Never Smoker  . Smokeless tobacco: Never Used  . Alcohol use Yes     Comment: occasional (DWI 1991)  . Drug use: No  . Sexual activity: Not on file   Other Topics Concern  . Not on file   Social History Narrative       Review of Systems No fever No chills or  sweats Appetite is fine No other skin infections    Objective:   Physical Exam  Constitutional: He appears well-nourished. No distress.  Skin:  Right 3rd toe still swollen and into foot over 3rd metatarsal 2nd toe fairly normal Mild heat and tenderness  Ulcer on plantar surface is slightly more medial. Seems closed and no apparent discharge now          Assessment & Plan:

## 2016-12-21 NOTE — Patient Instructions (Signed)
Please try a probiotic like align or activia yogurt.

## 2016-12-21 NOTE — Assessment & Plan Note (Signed)
Looks bad but has improved significantly Had MRSA in past Sig concern for osteomyelitis given chronic plantar ulcer  Will check x-ray. If any bony changes, urgent ortho eval If normal, will continue septra and see back 2 weeks

## 2016-12-22 ENCOUNTER — Other Ambulatory Visit: Payer: Self-pay | Admitting: Internal Medicine

## 2016-12-22 ENCOUNTER — Encounter (INDEPENDENT_AMBULATORY_CARE_PROVIDER_SITE_OTHER): Payer: Self-pay | Admitting: Family

## 2016-12-22 ENCOUNTER — Ambulatory Visit (INDEPENDENT_AMBULATORY_CARE_PROVIDER_SITE_OTHER): Payer: 59 | Admitting: Family

## 2016-12-22 ENCOUNTER — Telehealth: Payer: Self-pay

## 2016-12-22 VITALS — Ht 72.0 in | Wt 217.0 lb

## 2016-12-22 DIAGNOSIS — L97412 Non-pressure chronic ulcer of right heel and midfoot with fat layer exposed: Secondary | ICD-10-CM | POA: Diagnosis not present

## 2016-12-22 DIAGNOSIS — M86271 Subacute osteomyelitis, right ankle and foot: Secondary | ICD-10-CM | POA: Diagnosis not present

## 2016-12-22 DIAGNOSIS — E08621 Diabetes mellitus due to underlying condition with foot ulcer: Secondary | ICD-10-CM | POA: Diagnosis not present

## 2016-12-22 MED ORDER — DOXYCYCLINE HYCLATE 100 MG PO TABS: 100.0000 mg | ORAL_TABLET | Freq: Two times a day (BID) | ORAL | 0 refills | Status: DC

## 2016-12-22 NOTE — Telephone Encounter (Signed)
Appt was scheduled at [place] with Dondra Prader N.P. today and patient is aware.

## 2016-12-22 NOTE — Progress Notes (Signed)
Office Visit Note   Patient: [name]           Date of Birth: September 21, 1955           MRN: [id number] Visit Date: 12/22/2016              Requested by: Venia Carbon, MD [place], [place] [address] PCP: Venia Carbon, MD  Chief Complaint  Patient presents with  . Right Foot - Pain      HPI: The patient is a 61 year old gentleman who presents today for evaluation of his right foot. Has had a history of cellulitis for which she completed a course of Septra this evening. Did have radiographs with her primary care doctor [place] who referred him to [place]. Radiographs were concerning for osteomyelitis with gas in the foot.   Radiographs this am: IMPRESSION: Extensive soft tissue gas/ soft tissue infection at the dorsum of the RIGHT foot at the level of the third and fourth MTP joints extending into the proximal phalanges of the third and fourth toes.  Bone destruction of the head of the third metatarsal, proximal aspect of proximal phalanx third toe, and questionably proximal phalanx fourth toe consistent with osteomyelitis associated with probable septic arthritis of the third MTP joint.  Has a plantar ulceration that he has had for over a year now. Did follow with the wound center for quite some time. States he stopped following with the wound center this December. Has been applying Neosporin and a Band-Aid daily since that time. Has had worsening of swelling to the dorsum of his foot over the third metatarsal head as well as swelling. Was told at one point this was a cyst.  Of note he is status post remote bunionectomy as well as hammertoe repair of the second and third toes to the right foot. He states that the complications from the surgery. States foot has not been right since.  Assessment & Plan: Visit Diagnoses:  1. Subacute osteomyelitis of right foot ([place])   2. Diabetic ulcer of right midfoot associated with diabetes mellitus due to underlying  condition, with fat layer exposed ([place])     Plan: Have provided a prescription for Doxycycline. Discussed surgical risks and benefits at length. Have counseled he and companion at length on radiographs, treatment plan and return precautions. Will follow up in office with Dr. Sharol Given on Monday.  Follow-Up Instructions: Return in about 3 days (around 12/25/2016).   Ortho Exam  Patient is alert, oriented, no adenopathy, well-dressed, normal affect, normal respiratory effort. Right foot with plantar ulceration beneath the 3rd metatarsal head. This is 1 cm in diameteter. Is 1 mm deep. Is filled in with 100% exudative tissue. No surrounding erythema or drainage. Dorsally has significant swelling and erythema. An abrasion. Sausage digit swelling of 3rd and 4th toes. No induration or subcutaneous emphysema. No ascending cellulitis. No odor.  Imaging: No results found.  Labs: Lab Results  Component Value Date   HGBA1C 6.8 (H) 05/12/2016   HGBA1C 7.1 (H) 10/29/2015   HGBA1C 6.9 (H) 04/29/2015   LABURIC 6.2 08/13/2013   REPTSTATUS 04/02/2016 FINAL 03/28/2016   GRAMSTAIN  03/28/2016    MODERATE WBC PRESENT,BOTH PMN AND MONONUCLEAR ABUNDANT GRAM POSITIVE COCCI IN PAIRS IN CHAINS MODERATE GRAM POSITIVE COCCI IN CLUSTERS MODERATE GRAM VARIABLE ROD    CULT  03/28/2016    ABUNDANT ENTEROCOCCUS FAECALIS FEW KLEBSIELLA PNEUMONIAE FEW ACINETOBACTER SPECIES    LABORGA KLEBSIELLA PNEUMONIAE 03/28/2016   LABORGA  ENTEROCOCCUS FAECALIS 03/28/2016   LABORGA ACINETOBACTER SPECIES 03/28/2016    Orders:  No orders of the defined types were placed in this encounter.  Meds ordered this encounter  Medications  . doxycycline (VIBRA-TABS) 100 MG tablet    Sig: Take 1 tablet (100 mg total) by mouth 2 (two) times daily.    Dispense:  60 tablet    Refill:  0     Procedures: No procedures performed  Clinical Data: No additional findings.  ROS:  All other systems negative, except as noted in the  HPI. Review of Systems  Constitutional: Negative for appetite change, chills and fever.  Cardiovascular: Negative for leg swelling.  Gastrointestinal: Positive for nausea.  Skin: Positive for color change and wound. Negative for rash.    Objective: Vital Signs: Ht 6' (1.829 m)   Wt 217 lb (98.4 kg)   BMI 29.43 kg/m   Specialty Comments:  No specialty comments available.  PMFS History: Patient Active Problem List   Diagnosis Date Noted  . Cellulitis of right foot 12/21/2016  . Diabetic foot ulcer ([place]) 10/29/2015  . Pedal edema 10/05/2014  . Adenomatous polyp   . Plantar flexed metatarsal   . Routine general medical examination at a health care facility 04/17/2011  . OSTEOARTHRITIS 03/17/2007  . DM (diabetes mellitus) type II controlled, neurological manifestation ([place]) 03/13/2007  . Hyperlipemia 03/13/2007  . GOUT 03/13/2007  . Essential hypertension, benign 03/13/2007  . GERD 03/13/2007  . PSORIASIS 03/13/2007   Past Medical History:  Diagnosis Date  . Adenomatous polyp   . Allergy   . Arthritis    feet   . Bunion 04/02/2013   STATUS POST OP BUN REPAIR  . Diabetes mellitus   . GERD (gastroesophageal reflux disease)   . Gout   . Hammertoe   . Hyperlipidemia   . Hypertension   . Neuromuscular disorder (HCC)    neuropathy  . Plantar flexed metatarsal   . Psoriasis     Family History  Problem Relation Age of Onset  . Diabetes Mother   . Cancer Neg Hx   . Colon cancer Neg Hx   . Colon polyps Neg Hx   . Rectal cancer Neg Hx   . Stomach cancer Neg Hx     Past Surgical History:  Procedure Laterality Date  . COLONOSCOPY    . FOOT SURGERY Right 02/28/2013   BUNIION HAM TOE2,3 PINS ,2ND MET OSTEOTOMY, 5TH MET W/SCREW   02-2013, 04-2013  . [place]   surgery x2  . POLYPECTOMY    . TOTAL HIP ARTHROPLASTY     Left hip replacement/femur fx 07/04, Screw removal [facility] hip- [facility] 11/01   Social History   Occupational History  . PC  ADMINISTRATOR [employer]   Social History Main Topics  . Smoking status: Never Smoker  . Smokeless tobacco: Never Used  . Alcohol use Yes     Comment: occasional (DWI 1991)  . Drug use: No  . Sexual activity: Not on file

## 2016-12-22 NOTE — Telephone Encounter (Signed)
Discussed with him Advised proceeding to ER for admission, IV antibiotics and surgery He is vacillating Will try to get him in with ortho this afternoon to convince him to go now

## 2016-12-22 NOTE — Telephone Encounter (Signed)
Impression: Extensive soft tissue gas/ soft tissue infection at the dorsum of the RIGHT foot at the level of the third and fourth MTP joints extending into the proximal phalanges of the third and fourth toes.  Bone destruction of the head of the third metatarsal, proximal aspect of proximal phalanx third toe, and questionably proximal phalanx fourth toe consistent with osteomyelitis associated with probable septic arthritis of the third MTP joint.

## 2016-12-25 ENCOUNTER — Encounter (INDEPENDENT_AMBULATORY_CARE_PROVIDER_SITE_OTHER): Payer: Self-pay | Admitting: Orthopedic Surgery

## 2016-12-25 ENCOUNTER — Ambulatory Visit (INDEPENDENT_AMBULATORY_CARE_PROVIDER_SITE_OTHER): Payer: 59 | Admitting: Orthopedic Surgery

## 2016-12-25 ENCOUNTER — Telehealth: Payer: Self-pay

## 2016-12-25 ENCOUNTER — Encounter: Payer: Self-pay | Admitting: Internal Medicine

## 2016-12-25 ENCOUNTER — Other Ambulatory Visit (INDEPENDENT_AMBULATORY_CARE_PROVIDER_SITE_OTHER): Payer: Self-pay | Admitting: Family

## 2016-12-25 ENCOUNTER — Other Ambulatory Visit (INDEPENDENT_AMBULATORY_CARE_PROVIDER_SITE_OTHER): Payer: Self-pay | Admitting: Orthopedic Surgery

## 2016-12-25 DIAGNOSIS — L97414 Non-pressure chronic ulcer of right heel and midfoot with necrosis of bone: Secondary | ICD-10-CM | POA: Diagnosis not present

## 2016-12-25 DIAGNOSIS — M86271 Subacute osteomyelitis, right ankle and foot: Secondary | ICD-10-CM | POA: Insufficient documentation

## 2016-12-25 DIAGNOSIS — E08621 Diabetes mellitus due to underlying condition with foot ulcer: Secondary | ICD-10-CM | POA: Diagnosis not present

## 2016-12-25 DIAGNOSIS — L03115 Cellulitis of right lower limb: Secondary | ICD-10-CM

## 2016-12-25 NOTE — Progress Notes (Signed)
 Office Visit Note   Patient: [name]           Date of Birth: 07/18/1956           MRN: [id number] Visit Date: 12/25/2016              Requested by: Letvak, Richard I, MD [address] [place], [place] [address] PCP: Letvak, Richard I, MD  Chief Complaint  Patient presents with  . Right Foot - Follow-up, Cellulitis      HPI: Patient is a 61-year-old gentleman who reports over one-year history and ulcer in the plantar aspect of the right foot. 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 Patient presents with cellulitis and purulent drainage ulceration right forefoot. Patient did see Aaron on Friday and was started on doxycycline. Patient feels like the infection is only been there for 2 weeks.  Assessment & Plan: Visit Diagnoses:  1. Cellulitis of right foot   2. Subacute osteomyelitis of right foot (HCC)   3. Diabetic ulcer of right midfoot associated with diabetes mellitus due to underlying condition, with necrosis of bone (HCC)     Plan: After informed consent sterile prepping patient underwent a local block with 2 mL of 1% lidocaine. A dorsal incision was made over previous surgical incision where the fluid was tenting the skin. A 2 cm incision was made the abscess was decompressed. The ulcer on the plantar aspect of the right forefoot beneath the third metatarsal head probed all the way down to multiple metatarsal bones and probed all the way dorsally with complete ulceration from plantar to dorsal proximal 6 cm in length. Patient has sausage digit swelling of the third toe as well as swelling and cellulitis of the second and fourth toes. The first and fifth metatarsal heads did not appear to be involved. Patient had destructive bony changes at the fusion site of the PIP joints of the second and third toes consistent with chronic osteomyelitis and patient had  chronic destruction of the third metatarsal head consistent with chronic osteomyelitis.  Patient has a strong dorsalis pedis and posterior tibial pulse.  Cultures were sent for the deep abscess.  Discussed with the patient importance to proceeding with surgery and discussed that at best we could preserve a transmetatarsal amputation. Patient was extremely reluctant to proceed with surgery he states that he had a lot of issues to resolve before undergoing surgery. I discussed the risk of waiting and the potential for transtibial amputation if this infection worsens. Patient states he understands he states that at best he can proceed with surgery this Friday and he understands that if the infection worsens he would require a transtibial amputation. Patient states he works for [employer] and I discussed that he most likely could work from home in about 3 weeks.  Follow-Up Instructions: Return in about 2 weeks (around 01/08/2017).   Ortho Exam  Patient is alert, oriented, no adenopathy, well-dressed, normal affect, normal respiratory effort. On examination patient has an antalgic gait. He has sausage digit swelling of the third metatarsal with swelling and cellulitis of the second and third toes as well. He has a good dorsalis pedis and posterior tibial pulse he has cellulitis to the midfoot he has a plantar ulcer beneath the third metatarsal head which probes all the way to the dorsal skin and probes to metatarsals 2,3 and 4. There is destructive   bony changes that are palpated with the Q-tip. After informed consent the abscess was decompressed and cultures sent.  Imaging: No results found.  Labs: Lab Results  Component Value Date   HGBA1C 6.8 (H) 05/12/2016   HGBA1C 7.1 (H) 10/29/2015   HGBA1C 6.9 (H) 04/29/2015   LABURIC 6.2 08/13/2013   REPTSTATUS 04/02/2016 FINAL 03/28/2016   GRAMSTAIN  03/28/2016    MODERATE WBC PRESENT,BOTH PMN AND MONONUCLEAR ABUNDANT GRAM POSITIVE COCCI IN PAIRS IN  CHAINS MODERATE GRAM POSITIVE COCCI IN CLUSTERS MODERATE GRAM VARIABLE ROD    CULT  03/28/2016    ABUNDANT ENTEROCOCCUS FAECALIS FEW KLEBSIELLA PNEUMONIAE FEW ACINETOBACTER SPECIES    LABORGA KLEBSIELLA PNEUMONIAE 03/28/2016   LABORGA ENTEROCOCCUS FAECALIS 03/28/2016   LABORGA ACINETOBACTER SPECIES 03/28/2016    Orders:  Orders Placed This Encounter  Procedures  . WOUND CULTURE   No orders of the defined types were placed in this encounter.    Procedures: No procedures performed  Clinical Data: No additional findings.  ROS:  All other systems negative, except as noted in the HPI. Review of Systems  Objective: Vital Signs: There were no vitals taken for this visit.  Specialty Comments:  No specialty comments available.  PMFS History: Patient Active Problem List   Diagnosis Date Noted  . Subacute osteomyelitis of right foot (HCC) 12/25/2016  . Cellulitis of right foot 12/21/2016  . Diabetic foot ulcer (HCC) 10/29/2015  . Pedal edema 10/05/2014  . Adenomatous polyp   . Plantar flexed metatarsal   . Routine general medical examination at a health care facility 04/17/2011  . OSTEOARTHRITIS 03/17/2007  . DM (diabetes mellitus) type II controlled, neurological manifestation (HCC) 03/13/2007  . Hyperlipemia 03/13/2007  . GOUT 03/13/2007  . Essential hypertension, benign 03/13/2007  . GERD 03/13/2007  . PSORIASIS 03/13/2007   Past Medical History:  Diagnosis Date  . Adenomatous polyp   . Allergy   . Arthritis    feet   . Bunion 04/02/2013   STATUS POST OP BUN REPAIR  . Diabetes mellitus   . GERD (gastroesophageal reflux disease)   . Gout   . Hammertoe   . Hyperlipidemia   . Hypertension   . Neuromuscular disorder (HCC)    neuropathy  . Plantar flexed metatarsal   . Psoriasis     Family History  Problem Relation Age of Onset  . Diabetes Mother   . Cancer Neg Hx   . Colon cancer Neg Hx   . Colon polyps Neg Hx   . Rectal cancer Neg Hx   . Stomach  cancer Neg Hx     Past Surgical History:  Procedure Laterality Date  . COLONOSCOPY    . FOOT SURGERY Right 02/28/2013   BUNIION HAM TOE2,3 PINS ,2ND MET OSTEOTOMY, 5TH MET W/SCREW   02-2013, 04-2013  . HIP FRACTURE SURGERY  1993   surgery x2  . POLYPECTOMY    . 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 Smoking status: Never Smoker  . Smokeless tobacco: Never Used  . Alcohol use Yes     Comment: occasional (DWI 1991)  . Drug use: No  . Sexual activity: Not on file       

## 2016-12-25 NOTE — Telephone Encounter (Signed)
Pt left v/m with the same info as in this mychart message; pt request cb at [phone].

## 2016-12-25 NOTE — Telephone Encounter (Signed)
Error already my chart note in pts chart.

## 2016-12-26 ENCOUNTER — Encounter (INDEPENDENT_AMBULATORY_CARE_PROVIDER_SITE_OTHER): Payer: 59 | Admitting: Vascular Surgery

## 2016-12-26 DIAGNOSIS — E11621 Type 2 diabetes mellitus with foot ulcer: Secondary | ICD-10-CM | POA: Diagnosis not present

## 2016-12-26 DIAGNOSIS — L089 Local infection of the skin and subcutaneous tissue, unspecified: Secondary | ICD-10-CM | POA: Diagnosis not present

## 2016-12-26 DIAGNOSIS — L97509 Non-pressure chronic ulcer of other part of unspecified foot with unspecified severity: Secondary | ICD-10-CM | POA: Diagnosis not present

## 2016-12-26 LAB — PLEASE NOTE

## 2016-12-28 ENCOUNTER — Telehealth (INDEPENDENT_AMBULATORY_CARE_PROVIDER_SITE_OTHER): Payer: Self-pay | Admitting: Orthopedic Surgery

## 2016-12-28 ENCOUNTER — Encounter (HOSPITAL_COMMUNITY): Payer: Self-pay | Admitting: *Deleted

## 2016-12-28 DIAGNOSIS — M84477A Pathological fracture, right toe(s), initial encounter for fracture: Secondary | ICD-10-CM | POA: Diagnosis not present

## 2016-12-28 DIAGNOSIS — R2241 Localized swelling, mass and lump, right lower limb: Secondary | ICD-10-CM | POA: Diagnosis not present

## 2016-12-28 DIAGNOSIS — L089 Local infection of the skin and subcutaneous tissue, unspecified: Secondary | ICD-10-CM | POA: Diagnosis not present

## 2016-12-28 DIAGNOSIS — L02611 Cutaneous abscess of right foot: Secondary | ICD-10-CM | POA: Diagnosis not present

## 2016-12-28 DIAGNOSIS — E11628 Type 2 diabetes mellitus with other skin complications: Secondary | ICD-10-CM | POA: Diagnosis not present

## 2016-12-28 DIAGNOSIS — M86171 Other acute osteomyelitis, right ankle and foot: Secondary | ICD-10-CM | POA: Diagnosis not present

## 2016-12-28 DIAGNOSIS — E1169 Type 2 diabetes mellitus with other specified complication: Secondary | ICD-10-CM | POA: Diagnosis not present

## 2016-12-28 DIAGNOSIS — L02415 Cutaneous abscess of right lower limb: Secondary | ICD-10-CM | POA: Diagnosis not present

## 2016-12-28 DIAGNOSIS — M898X7 Other specified disorders of bone, ankle and foot: Secondary | ICD-10-CM | POA: Diagnosis not present

## 2016-12-28 DIAGNOSIS — M79671 Pain in right foot: Secondary | ICD-10-CM | POA: Diagnosis not present

## 2016-12-28 DIAGNOSIS — E119 Type 2 diabetes mellitus without complications: Secondary | ICD-10-CM | POA: Insufficient documentation

## 2016-12-28 DIAGNOSIS — M7989 Other specified soft tissue disorders: Secondary | ICD-10-CM | POA: Diagnosis not present

## 2016-12-28 MED ORDER — CEFAZOLIN SODIUM-DEXTROSE 2-4 GM/100ML-% IV SOLN: 2.0000 g | INTRAVENOUS | Status: DC

## 2016-12-28 NOTE — Telephone Encounter (Signed)
I received a voicemail from a male at 1:28pm yesterday 12/27/16 stating that Mr. [name] wanted to cancel his right transmetatarsal amputation that was scheduled for Friday 12/29/2016.  There was no other information left on the message.  I called Mr. [name] directly to discuss this with him.  He stated that he was currently at [facility] in [place] getting his foot taken care of.  Advised that they would be handling his care from now on.

## 2016-12-29 ENCOUNTER — Inpatient Hospital Stay (HOSPITAL_COMMUNITY): Admission: RE | Admit: 2016-12-29 | Payer: 59 | Source: Ambulatory Visit | Admitting: Orthopedic Surgery

## 2016-12-29 ENCOUNTER — Encounter (HOSPITAL_COMMUNITY): Admission: RE | Payer: Self-pay | Source: Ambulatory Visit

## 2016-12-29 DIAGNOSIS — S91301A Unspecified open wound, right foot, initial encounter: Secondary | ICD-10-CM | POA: Diagnosis not present

## 2016-12-29 DIAGNOSIS — I10 Essential (primary) hypertension: Secondary | ICD-10-CM | POA: Diagnosis not present

## 2016-12-29 DIAGNOSIS — E11628 Type 2 diabetes mellitus with other skin complications: Secondary | ICD-10-CM | POA: Diagnosis not present

## 2016-12-29 DIAGNOSIS — L089 Local infection of the skin and subcutaneous tissue, unspecified: Secondary | ICD-10-CM | POA: Diagnosis not present

## 2016-12-29 SURGERY — AMPUTATION, FOOT, PARTIAL
Anesthesia: Choice | Laterality: Right

## 2016-12-30 DIAGNOSIS — E11628 Type 2 diabetes mellitus with other skin complications: Secondary | ICD-10-CM | POA: Diagnosis not present

## 2016-12-30 DIAGNOSIS — L089 Local infection of the skin and subcutaneous tissue, unspecified: Secondary | ICD-10-CM | POA: Diagnosis not present

## 2016-12-31 ENCOUNTER — Encounter: Payer: Self-pay | Admitting: Internal Medicine

## 2016-12-31 DIAGNOSIS — Z452 Encounter for adjustment and management of vascular access device: Secondary | ICD-10-CM | POA: Diagnosis not present

## 2016-12-31 DIAGNOSIS — T82898A Other specified complication of vascular prosthetic devices, implants and grafts, initial encounter: Secondary | ICD-10-CM | POA: Diagnosis not present

## 2016-12-31 DIAGNOSIS — M869 Osteomyelitis, unspecified: Secondary | ICD-10-CM | POA: Diagnosis not present

## 2016-12-31 DIAGNOSIS — E11628 Type 2 diabetes mellitus with other skin complications: Secondary | ICD-10-CM | POA: Diagnosis not present

## 2016-12-31 DIAGNOSIS — E1169 Type 2 diabetes mellitus with other specified complication: Secondary | ICD-10-CM | POA: Diagnosis not present

## 2016-12-31 DIAGNOSIS — L089 Local infection of the skin and subcutaneous tissue, unspecified: Secondary | ICD-10-CM | POA: Diagnosis not present

## 2016-12-31 LAB — AEROBIC CULTURE

## 2016-12-31 LAB — TEST CODE CHANGE

## 2017-01-01 ENCOUNTER — Telehealth: Payer: Self-pay

## 2017-01-01 NOTE — Telephone Encounter (Signed)
[name] with [facility] home infusion left v/m; pt was sent home from hospital with clogged pic line. cefemine and vancomycin was started on 12/31/16 due to pts pic line having to be cleared. Any questions contact Drew at [facility]. FYI to Dr Silvio Pate.

## 2017-01-01 NOTE — Telephone Encounter (Signed)
Office closed at 5. Could not leave a message.

## 2017-01-01 NOTE — Telephone Encounter (Signed)
Okay I spoke to the patient today Check with the nurse to see if they are doing vanco levels at some point

## 2017-01-02 DIAGNOSIS — E11628 Type 2 diabetes mellitus with other skin complications: Secondary | ICD-10-CM | POA: Diagnosis not present

## 2017-01-02 DIAGNOSIS — L089 Local infection of the skin and subcutaneous tissue, unspecified: Secondary | ICD-10-CM | POA: Diagnosis not present

## 2017-01-02 NOTE — Telephone Encounter (Signed)
They do not open until 830.

## 2017-01-04 NOTE — Telephone Encounter (Signed)
Spoke to [employer] at [employer]. Trough tomorrow at 2pm. He has faxed [place] some information. I was not with Dr Silvio Pate yesterday. Unsure if it came through.

## 2017-01-05 DIAGNOSIS — E11628 Type 2 diabetes mellitus with other skin complications: Secondary | ICD-10-CM | POA: Diagnosis not present

## 2017-01-05 DIAGNOSIS — Z7689 Persons encountering health services in other specified circumstances: Secondary | ICD-10-CM | POA: Diagnosis not present

## 2017-01-05 DIAGNOSIS — L089 Local infection of the skin and subcutaneous tissue, unspecified: Secondary | ICD-10-CM | POA: Diagnosis not present

## 2017-01-08 ENCOUNTER — Ambulatory Visit: Payer: 59 | Admitting: Internal Medicine

## 2017-01-08 DIAGNOSIS — Z7689 Persons encountering health services in other specified circumstances: Secondary | ICD-10-CM | POA: Diagnosis not present

## 2017-01-08 DIAGNOSIS — L089 Local infection of the skin and subcutaneous tissue, unspecified: Secondary | ICD-10-CM | POA: Diagnosis not present

## 2017-01-08 DIAGNOSIS — E11628 Type 2 diabetes mellitus with other skin complications: Secondary | ICD-10-CM | POA: Diagnosis not present

## 2017-01-09 ENCOUNTER — Telehealth: Payer: Self-pay | Admitting: Internal Medicine

## 2017-01-09 DIAGNOSIS — L089 Local infection of the skin and subcutaneous tissue, unspecified: Secondary | ICD-10-CM | POA: Diagnosis not present

## 2017-01-09 DIAGNOSIS — E11628 Type 2 diabetes mellitus with other skin complications: Secondary | ICD-10-CM | POA: Diagnosis not present

## 2017-01-09 NOTE — Telephone Encounter (Signed)
Patient did not come in for their appointment on 01/09/17 for 2 week follow up. Please let me know if patient needs to be contacted immediately for follow up or no follow up needed. Do you want to charge the NSF?

## 2017-01-09 NOTE — Telephone Encounter (Signed)
He is having a medical crisis with foot infection and planned amputation No charge  Reschedule his appt for about a month from now--so the surgery should be done, etc

## 2017-01-11 ENCOUNTER — Telehealth: Payer: Self-pay

## 2017-01-11 DIAGNOSIS — L0291 Cutaneous abscess, unspecified: Secondary | ICD-10-CM | POA: Diagnosis not present

## 2017-01-11 DIAGNOSIS — L02611 Cutaneous abscess of right foot: Secondary | ICD-10-CM | POA: Diagnosis not present

## 2017-01-11 DIAGNOSIS — L03115 Cellulitis of right lower limb: Secondary | ICD-10-CM | POA: Diagnosis not present

## 2017-01-11 DIAGNOSIS — E1140 Type 2 diabetes mellitus with diabetic neuropathy, unspecified: Secondary | ICD-10-CM | POA: Diagnosis not present

## 2017-01-11 DIAGNOSIS — I10 Essential (primary) hypertension: Secondary | ICD-10-CM | POA: Diagnosis not present

## 2017-01-11 DIAGNOSIS — E785 Hyperlipidemia, unspecified: Secondary | ICD-10-CM | POA: Diagnosis not present

## 2017-01-11 DIAGNOSIS — E11628 Type 2 diabetes mellitus with other skin complications: Secondary | ICD-10-CM | POA: Diagnosis not present

## 2017-01-11 DIAGNOSIS — K219 Gastro-esophageal reflux disease without esophagitis: Secondary | ICD-10-CM | POA: Diagnosis not present

## 2017-01-11 DIAGNOSIS — G8918 Other acute postprocedural pain: Secondary | ICD-10-CM | POA: Diagnosis not present

## 2017-01-11 HISTORY — PX: FOOT AMPUTATION: SHX951

## 2017-01-11 NOTE — Telephone Encounter (Signed)
Mitzi Hansen pharmacist left v/m; Mitzi Hansen called earlier and information given to [employer]. Mitzi Hansen wanted to give update; Mitzi Hansen just spoke with pt and he is at [facility] at [facility] and pt is being admitted. If pt needs continuation orders after pt is discharged on 01/12/17 can fax continuation orders to fax # [phone] or any questions or concerns call [phone] and ask for [facility]. FYI to Dr Silvio Pate.

## 2017-01-11 NOTE — Telephone Encounter (Signed)
Called [facility] and spoke to [place], Software engineer. He said they just received the lab results as well. He said the one before was therapeutic but this one is not. He is definitely going to adjust the dosage. 1500mg  q 8 hrs current. Will have him hold 2 doses. Will wait to get the level back to 15. Will have him then take 1gm q 8 hrs. His current order is scheduled to be finished tomorrow after 3rd dose. If he is to continue, will need new orders faxed to [phone]. If he is not to be extended, they need an order to remove PICC line.

## 2017-01-11 NOTE — Telephone Encounter (Signed)
Okay--the surgeon or infectious disease doctor at the hospital will have to decide on that

## 2017-01-12 ENCOUNTER — Encounter: Payer: Self-pay | Admitting: Internal Medicine

## 2017-01-12 ENCOUNTER — Telehealth: Payer: Self-pay

## 2017-01-12 DIAGNOSIS — E11628 Type 2 diabetes mellitus with other skin complications: Secondary | ICD-10-CM | POA: Diagnosis not present

## 2017-01-12 DIAGNOSIS — I10 Essential (primary) hypertension: Secondary | ICD-10-CM | POA: Diagnosis not present

## 2017-01-12 DIAGNOSIS — E785 Hyperlipidemia, unspecified: Secondary | ICD-10-CM | POA: Diagnosis not present

## 2017-01-12 DIAGNOSIS — L089 Local infection of the skin and subcutaneous tissue, unspecified: Secondary | ICD-10-CM | POA: Diagnosis not present

## 2017-01-12 DIAGNOSIS — K219 Gastro-esophageal reflux disease without esophagitis: Secondary | ICD-10-CM | POA: Diagnosis not present

## 2017-01-12 NOTE — Telephone Encounter (Signed)
[name],  Can you please remove no show fee per Dr Silvio Pate?

## 2017-01-12 NOTE — Telephone Encounter (Signed)
Mitzi Hansen with [facility] called regarding pt's IV meds. He said it was urgent since pt was just discharged from [place]. He is requesting a cb [phone].

## 2017-01-12 NOTE — Telephone Encounter (Signed)
Noted Theoretically, increasing the interval might be appropriate as well--depending on it they are shooting for a particular peak level

## 2017-01-12 NOTE — Telephone Encounter (Signed)
FYI: Surgeon has ordered Vanc 1.5g q 8 hrs to stop July 5. [name] is going to decrease that to 1gm q 8 hrs due to trough level of 24.7 on 01-08-17.   Surgeon also ordered 2g cephapen q 8 hrs until 01-25-17.

## 2017-01-14 DIAGNOSIS — L089 Local infection of the skin and subcutaneous tissue, unspecified: Secondary | ICD-10-CM | POA: Diagnosis not present

## 2017-01-14 DIAGNOSIS — E11628 Type 2 diabetes mellitus with other skin complications: Secondary | ICD-10-CM | POA: Diagnosis not present

## 2017-01-16 DIAGNOSIS — E11628 Type 2 diabetes mellitus with other skin complications: Secondary | ICD-10-CM | POA: Diagnosis not present

## 2017-01-16 DIAGNOSIS — L089 Local infection of the skin and subcutaneous tissue, unspecified: Secondary | ICD-10-CM | POA: Diagnosis not present

## 2017-01-18 DIAGNOSIS — L089 Local infection of the skin and subcutaneous tissue, unspecified: Secondary | ICD-10-CM | POA: Diagnosis not present

## 2017-01-18 DIAGNOSIS — E11628 Type 2 diabetes mellitus with other skin complications: Secondary | ICD-10-CM | POA: Diagnosis not present

## 2017-01-21 DIAGNOSIS — E11628 Type 2 diabetes mellitus with other skin complications: Secondary | ICD-10-CM | POA: Diagnosis not present

## 2017-01-21 DIAGNOSIS — L089 Local infection of the skin and subcutaneous tissue, unspecified: Secondary | ICD-10-CM | POA: Diagnosis not present

## 2017-01-22 DIAGNOSIS — L089 Local infection of the skin and subcutaneous tissue, unspecified: Secondary | ICD-10-CM | POA: Diagnosis not present

## 2017-01-22 DIAGNOSIS — E11628 Type 2 diabetes mellitus with other skin complications: Secondary | ICD-10-CM | POA: Diagnosis not present

## 2017-01-25 DIAGNOSIS — L089 Local infection of the skin and subcutaneous tissue, unspecified: Secondary | ICD-10-CM | POA: Diagnosis not present

## 2017-01-25 DIAGNOSIS — E11628 Type 2 diabetes mellitus with other skin complications: Secondary | ICD-10-CM | POA: Diagnosis not present

## 2017-01-30 DIAGNOSIS — L03115 Cellulitis of right lower limb: Secondary | ICD-10-CM | POA: Diagnosis not present

## 2017-02-06 DIAGNOSIS — Z89431 Acquired absence of right foot: Secondary | ICD-10-CM | POA: Diagnosis not present

## 2017-02-17 ENCOUNTER — Other Ambulatory Visit: Payer: Self-pay | Admitting: Internal Medicine

## 2017-02-18 ENCOUNTER — Other Ambulatory Visit: Payer: Self-pay | Admitting: Internal Medicine

## 2017-04-26 DIAGNOSIS — Z89431 Acquired absence of right foot: Secondary | ICD-10-CM | POA: Diagnosis not present

## 2017-05-16 ENCOUNTER — Other Ambulatory Visit: Payer: Self-pay | Admitting: Internal Medicine

## 2017-06-14 ENCOUNTER — Other Ambulatory Visit: Payer: Self-pay | Admitting: Internal Medicine

## 2017-06-23 ENCOUNTER — Other Ambulatory Visit: Payer: Self-pay | Admitting: Internal Medicine

## 2017-06-30 IMAGING — DX DG FOOT COMPLETE 3+V*R*
3 series · 3 of 3 positions shown · non-contrast
Comparison: 12/21/2015

CLINICAL DATA: Cellulitis at third toe with plantar ulcer RIGHT
foot

EXAM:
RIGHT FOOT COMPLETE - 3+ VIEW

[foot ap]
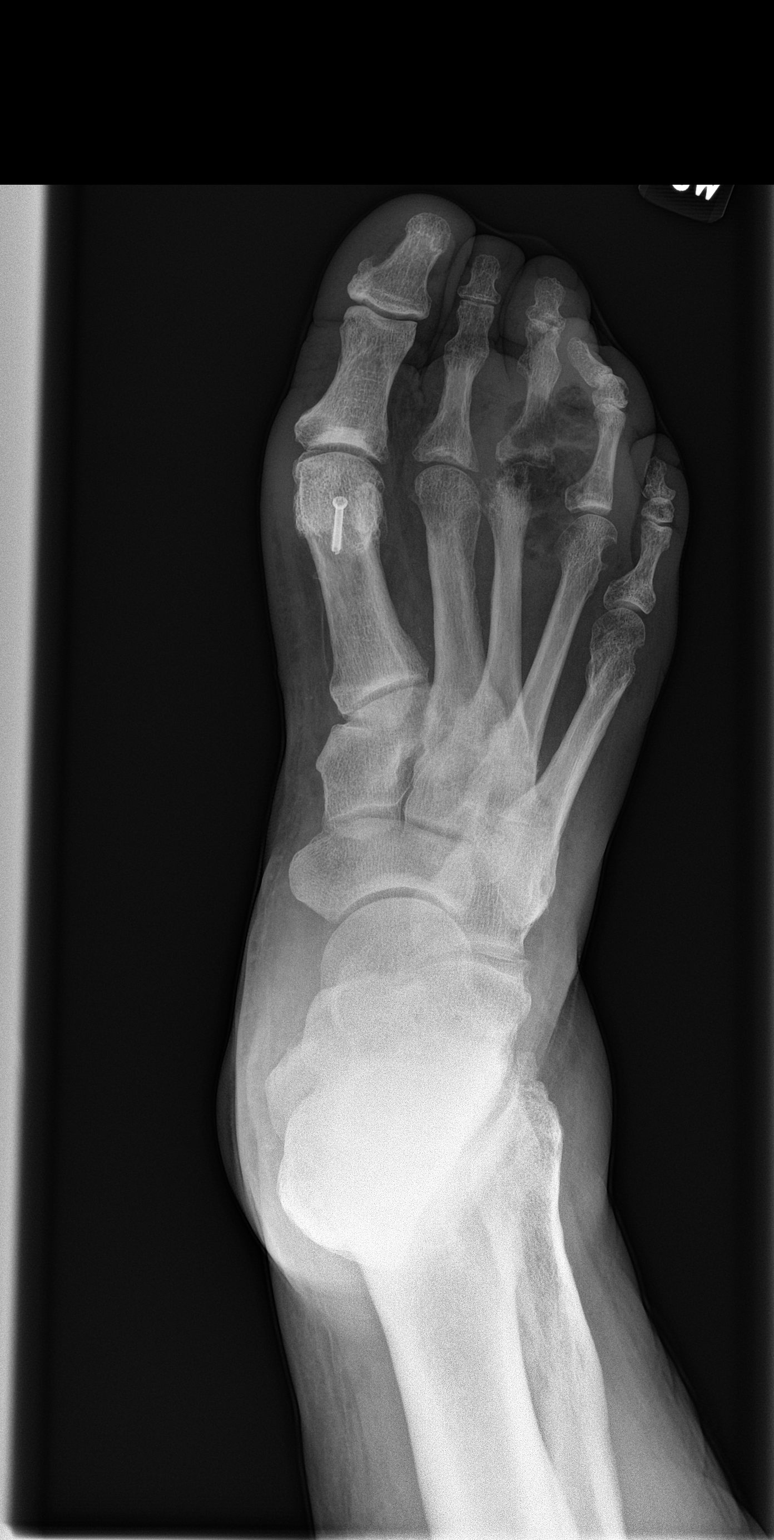

[foot obl]
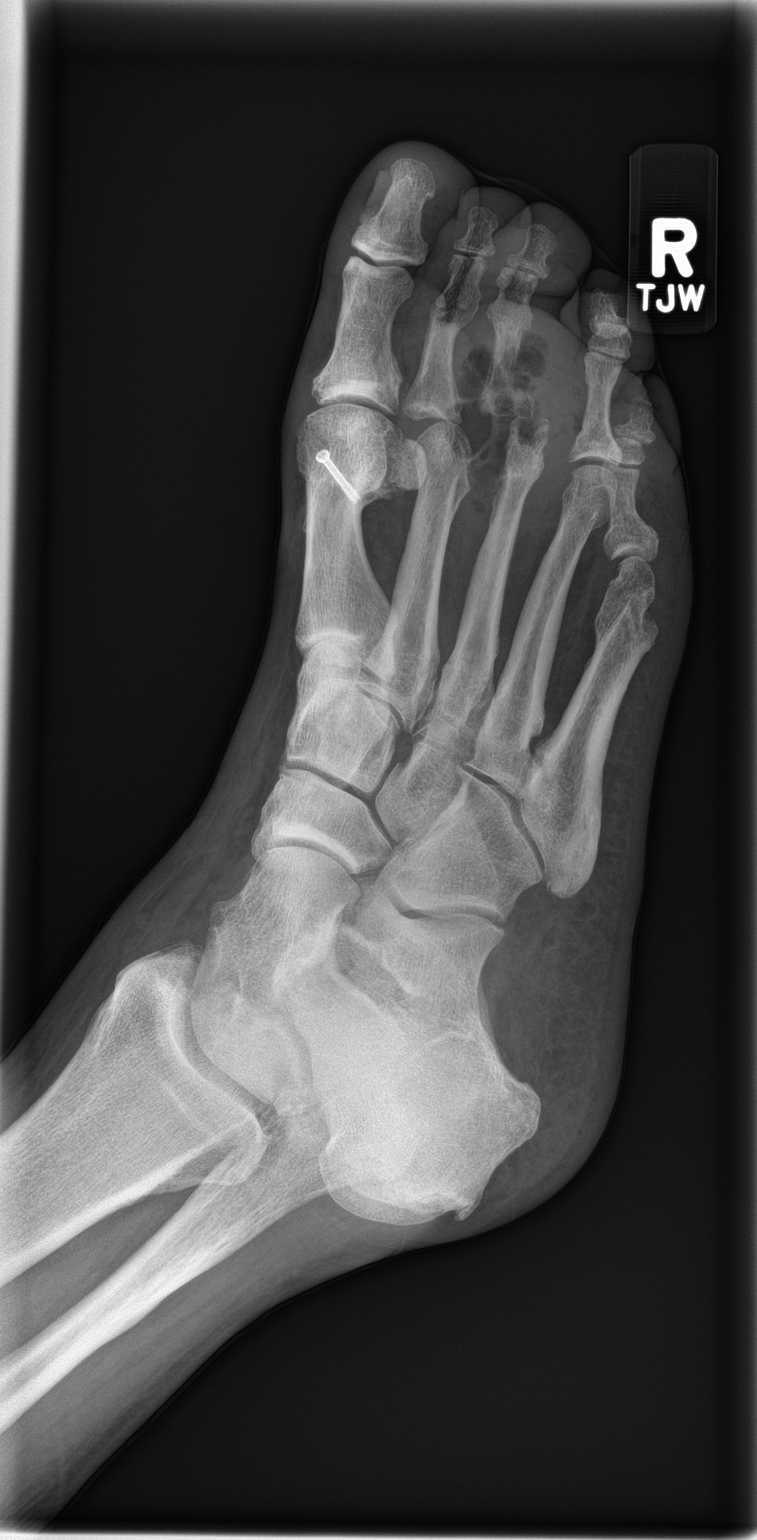

[foot lat]
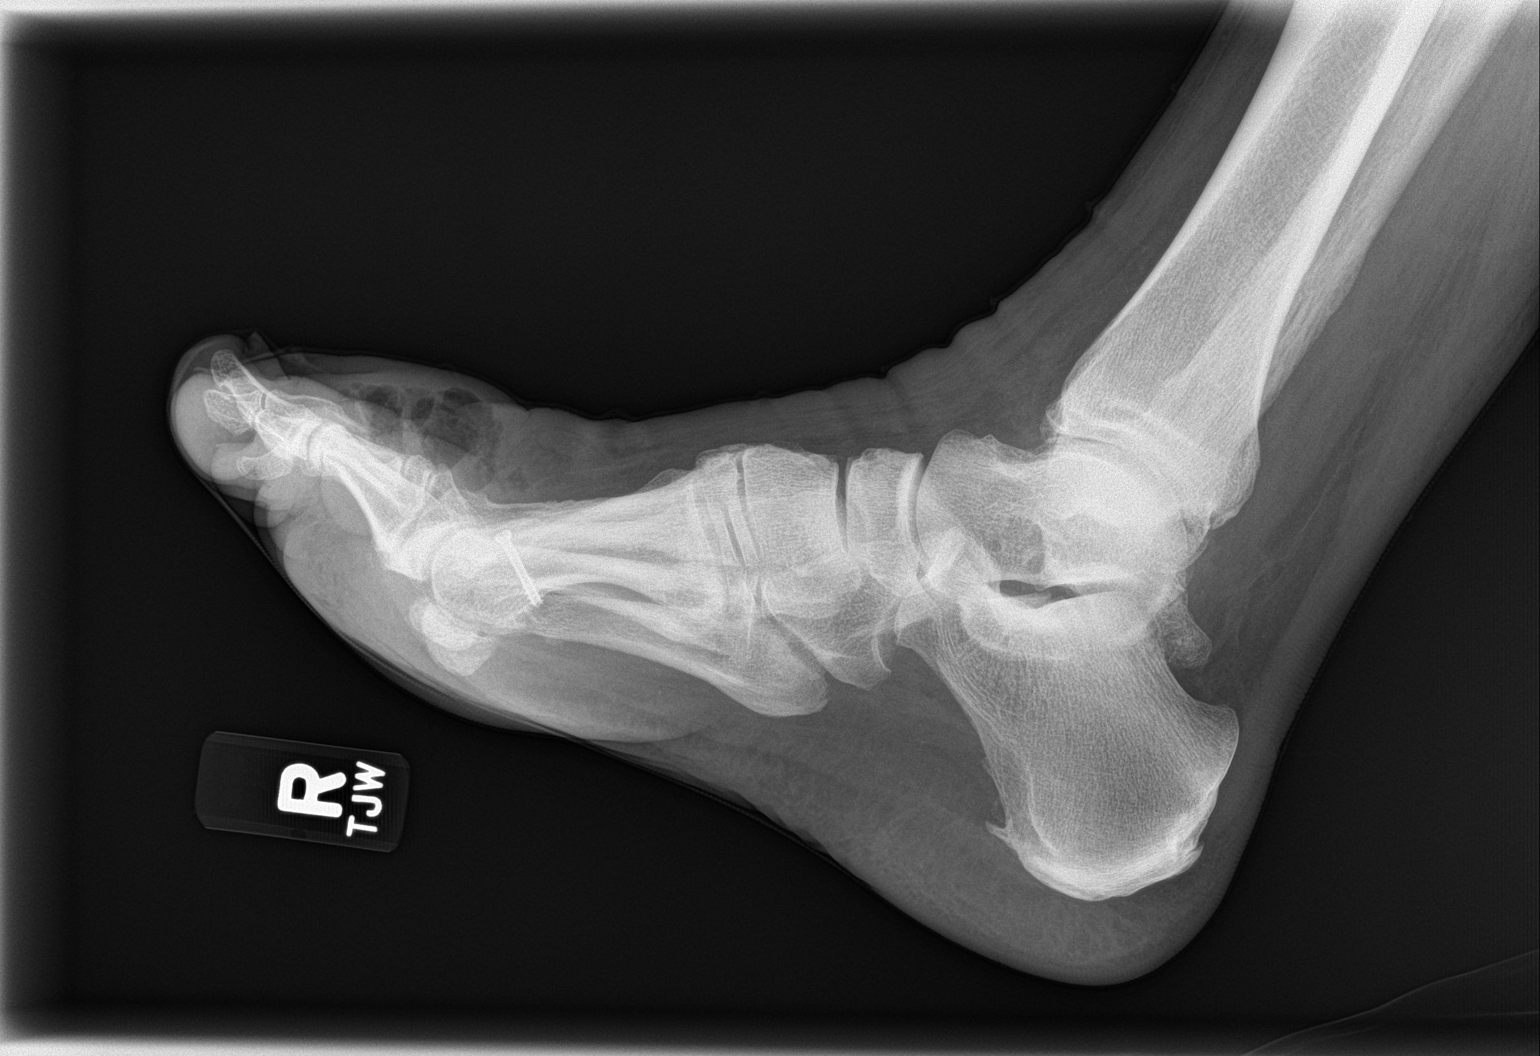

[3 of 3 positions shown; findings below may reference images not displayed]

FINDINGS: Osseous demineralization.

Cannulated screw at distal first metatarsal.

Large amount of soft tissue gas at the dorsal aspect of the RIGHT
foot at the level of the third and fourth MTP joints extending into
the level the proximal phalanges of the third and fourth toes.

Bone destruction at third metatarsal head and at the base of the
proximal phalanx third toe compatible with osteomyelitis.

This likely involves the third MTP joint as septic arthritis as
well.

Subluxations of the second and third MTP joints.

Questionable cortical destruction at the medial aspect base of
proximal phalanx fourth toe on the oblique view.

Old healed fracture distal fifth metatarsal.

No additional bone destruction seen.

Prior fusions of the PIP joints of the second and third toes.
IMPRESSION: Extensive soft tissue gas/ soft tissue infection at the dorsum of
the RIGHT foot at the level of the third and fourth MTP joints
extending into the proximal phalanges of the third and fourth toes.

Bone destruction of the head of the third metatarsal, proximal
aspect of proximal phalanx third toe, and questionably proximal
phalanx fourth toe consistent with osteomyelitis associated with
probable septic arthritis of the third MTP joint.

These results will be called to the ordering clinician or
representative by the Radiologist Assistant, and communication
documented in the PACS or zVision Dashboard.

## 2017-07-14 ENCOUNTER — Other Ambulatory Visit: Payer: Self-pay | Admitting: Internal Medicine

## 2017-07-22 DIAGNOSIS — J069 Acute upper respiratory infection, unspecified: Secondary | ICD-10-CM | POA: Diagnosis not present

## 2017-08-11 ENCOUNTER — Other Ambulatory Visit: Payer: Self-pay | Admitting: Internal Medicine

## 2017-09-10 ENCOUNTER — Ambulatory Visit (INDEPENDENT_AMBULATORY_CARE_PROVIDER_SITE_OTHER): Payer: 59 | Admitting: Internal Medicine

## 2017-09-10 ENCOUNTER — Other Ambulatory Visit: Payer: Self-pay | Admitting: Internal Medicine

## 2017-09-10 ENCOUNTER — Encounter: Payer: Self-pay | Admitting: Internal Medicine

## 2017-09-10 VITALS — BP 132/80 | HR 86 | Temp 97.6°F | Ht 72.0 in | Wt 206.0 lb

## 2017-09-10 DIAGNOSIS — I10 Essential (primary) hypertension: Secondary | ICD-10-CM

## 2017-09-10 DIAGNOSIS — R972 Elevated prostate specific antigen [PSA]: Secondary | ICD-10-CM | POA: Diagnosis not present

## 2017-09-10 DIAGNOSIS — Z23 Encounter for immunization: Secondary | ICD-10-CM

## 2017-09-10 DIAGNOSIS — Z794 Long term (current) use of insulin: Secondary | ICD-10-CM | POA: Diagnosis not present

## 2017-09-10 DIAGNOSIS — E1140 Type 2 diabetes mellitus with diabetic neuropathy, unspecified: Secondary | ICD-10-CM | POA: Diagnosis not present

## 2017-09-10 DIAGNOSIS — Z0000 Encounter for general adult medical examination without abnormal findings: Secondary | ICD-10-CM | POA: Diagnosis not present

## 2017-09-10 LAB — HM DIABETES FOOT EXAM

## 2017-09-10 NOTE — Addendum Note (Signed)
Addended by: Pilar Grammes on: 09/10/2017 12:47 PM   Modules accepted: Orders

## 2017-09-10 NOTE — Assessment & Plan Note (Signed)
Healthy Should try to exercise Flu vaccine  Will recheck PSA--was elevated Colon 1/18--small polyp (follow up 3-5 years)

## 2017-09-10 NOTE — Assessment & Plan Note (Signed)
BP Readings from Last 3 Encounters:  09/10/17 132/80  12/21/16 122/84  12/16/16 (!) 150/88   Good control  On ACEI

## 2017-09-10 NOTE — Progress Notes (Signed)
Subjective:    Patient ID: [name], male    DOB: 03/10/1956, 62 y.o.   MRN: [id number]  HPI Here for physical  Had transmetatarsal amputation for the osteomyelitis--right foot Has healed up  Diabetes control variable Had bad illness at end of year--- "sugar whacked out" Over 500 for a few days Now back to his usual--- 100-130's in AM, 250 after dinner Decreased sensation in feet--- not really painful Overdue for eye exam  Current Outpatient Medications on File Prior to Visit  Medication Sig Dispense Refill  . amLODipine (NORVASC) 10 MG tablet TAKE 1 TABLET BY MOUTH  DAILY 90 tablet 0  . aspirin 81 MG tablet Take 81 mg by mouth daily.      . B-D UF III MINI PEN NEEDLES 31G X 5 MM MISC USE AS DIRECTED DAILY 90 each 3  . calcium gluconate 500 MG tablet Take 500 mg by mouth daily.      . Cinnamon 500 MG capsule Take 500 mg by mouth 2 (two) times daily.      . furosemide (LASIX) 20 MG tablet TAKE 1 TABLET BY MOUTH  DAILY AS NEEDED 90 tablet 0  . hydrocortisone valerate ointment (WEST-CORT) 0.2 % APPLY 1 APPLICATION  TOPICALLY 2 (TWO) TIMES  DAILY. 120 g 0  . metFORMIN (GLUCOPHAGE) 1000 MG tablet TAKE 1 TABLET BY MOUTH TWO  TIMES DAILY WITH MEALS 180 tablet 0  . omeprazole (PRILOSEC) 20 MG capsule TAKE 1 CAPSULE BY MOUTH TWO TIMES DAILY BEFORE MEALS 180 capsule 0  . ONE TOUCH ULTRA TEST test strip CHECK BLOOD SUGAR TWO TIMES DAILY 200 each 1  . triamcinolone lotion (KENALOG) 0.1 % APPLY ONE APPLICATION  TOPICALLY 3 TIMES DAILY 60 mL 0  . Alcohol Swabs (B-D SINGLE USE SWABS REGULAR) PADS USE TWO TIMES DAILY 200 each 3   No current facility-administered medications on file prior to visit.     Allergies  Allergen Reactions  . Glipizide Other (See Comments)    REACTION: wt. gain, hands tingling  . Lisinopril Other (See Comments)    HYPERKALEMIA  . Ramipril Cough  . Ibuprofen     UNSPECIFIED REACTION     Past Medical History:  Diagnosis Date  . Adenomatous polyp   .  Allergy   . Arthritis    feet   . Bunion 04/02/2013   STATUS POST OP BUN REPAIR  . Diabetes mellitus   . GERD (gastroesophageal reflux disease)   . Gout   . Hammertoe   . Hyperlipidemia   . Hypertension   . Neuromuscular disorder (HCC)    neuropathy  . Plantar flexed metatarsal   . Psoriasis     Past Surgical History:  Procedure Laterality Date  . COLONOSCOPY    . FOOT AMPUTATION Right 01/11/2017   Dr Doreatha Lew ([facility])--transmetatarsal  . FOOT SURGERY Right 02/28/2013   Shaune Leeks HAM TOE2,3 PINS ,2ND MET OSTEOTOMY, 5TH MET W/SCREW   02-2013, 04-2013  . [place]   surgery x2  . POLYPECTOMY    . TOTAL HIP ARTHROPLASTY     Left hip replacement/femur fx 07/04, Screw removal left hip- [facility] 11/01    Family History  Problem Relation Age of Onset  . Diabetes Mother   . Cancer Neg Hx   . Colon cancer Neg Hx   . Colon polyps Neg Hx   . Rectal cancer Neg Hx   . Stomach cancer Neg Hx     Social History   Socioeconomic  History  . Marital status: Married    Spouse name: Not on file  . Number of children: 3  . Years of education: Not on file  . Highest education level: Not on file  Social Needs  . Financial resource strain: Not on file  . Food insecurity - worry: Not on file  . Food insecurity - inability: Not on file  . Transportation needs - medical: Not on file  . Transportation needs - non-medical: Not on file  Occupational History  . Occupation: PC ADMINISTRATOR    Employer: [employer]  Tobacco Use  . Smoking status: Never Smoker  . Smokeless tobacco: Never Used  Substance and Sexual Activity  . Alcohol use: Yes    Comment: occasional (DWI 1991)  . Drug use: No  . Sexual activity: Not on file  Other Topics Concern  . Not on file  Social History Narrative       Review of Systems  Constitutional: Negative for fatigue.       Weight down some Not much exercise Wears seat belt  HENT: Positive for tinnitus. Negative for hearing loss and trouble  swallowing.        Cavity --may need extraction Top tooth broken and repaired  Eyes: Negative for visual disturbance.       No diplopia or unilateral vision loss  Respiratory: Negative for cough, chest tightness and shortness of breath.   Cardiovascular: Negative for chest pain, palpitations and leg swelling.  Gastrointestinal: Negative for blood in stool and constipation.       Heartburn controlled with med  Endocrine: Negative for polydipsia and polyuria.  Genitourinary: Negative for urgency.       Occasional delay starting and slow stream No sexual problems  Musculoskeletal: Negative for back pain and joint swelling.       Mild joint pain--hasn't needed indomethacin for a long time Gout has been quiet  Allergic/Immunologic: Positive for environmental allergies. Negative for immunocompromised state.  Neurological: Positive for headaches. Negative for dizziness, syncope and light-headedness.  Hematological: Negative for adenopathy. Bruises/bleeds easily.  Psychiatric/Behavioral: Negative for dysphoric mood and sleep disturbance. The patient is not nervous/anxious.        Objective:   Physical Exam  Constitutional: He is oriented to person, place, and time. He appears well-developed. No distress.  HENT:  Head: Normocephalic and atraumatic.  Right Ear: External ear normal.  Left Ear: External ear normal.  Mouth/Throat: Oropharynx is clear and moist. No oropharyngeal exudate.  Eyes: Conjunctivae are normal. Pupils are equal, round, and reactive to light.  Neck: Normal range of motion. Neck supple. No thyromegaly present.  Cardiovascular: Normal rate, normal heart sounds and intact distal pulses. Exam reveals no gallop.  No murmur heard. Pulmonary/Chest: Effort normal and breath sounds normal. No respiratory distress. He has no wheezes. He has no rales.  Abdominal: Soft. There is no tenderness.  Musculoskeletal: He exhibits no edema.  Right foot transmetatarsal amputation well  healed No ulcers  Lymphadenopathy:    He has no cervical adenopathy.  Neurological: He is alert and oriented to person, place, and time.  Decreased sensation in feet Left>right foot numbness  Skin: No rash noted. No erythema.  No foot lesions  Psychiatric: He has a normal mood and affect. His behavior is normal.          Assessment & Plan:

## 2017-09-10 NOTE — Assessment & Plan Note (Signed)
Hopefully still good control 

## 2017-09-11 LAB — CBC
Hematocrit: 39.3 % (ref 37.5–51.0)
Hemoglobin: 13.3 g/dL (ref 13.0–17.7)
MCH: 27.7 pg (ref 26.6–33.0)
MCHC: 33.8 g/dL (ref 31.5–35.7)
MCV: 82 fL (ref 79–97)
PLATELETS: 310 10*3/uL (ref 150–379)
RBC: 4.81 x10E6/uL (ref 4.14–5.80)
RDW: 13.8 % (ref 12.3–15.4)
WBC: 8.9 10*3/uL (ref 3.4–10.8)

## 2017-09-11 LAB — COMPREHENSIVE METABOLIC PANEL WITH GFR
ALT: 11 IU/L (ref 0–44)
AST: 14 IU/L (ref 0–40)
Albumin/Globulin Ratio: 2 (ref 1.2–2.2)
Albumin: 4.8 g/dL (ref 3.6–4.8)
Alkaline Phosphatase: 80 IU/L (ref 39–117)
BUN/Creatinine Ratio: 18 (ref 10–24)
BUN: 19 mg/dL (ref 8–27)
Bilirubin Total: 0.4 mg/dL (ref 0.0–1.2)
CO2: 25 mmol/L (ref 20–29)
Calcium: 9.8 mg/dL (ref 8.6–10.2)
Chloride: 100 mmol/L (ref 96–106)
Creatinine, Ser: 1.06 mg/dL (ref 0.76–1.27)
GFR calc Af Amer: 87 mL/min/1.73
GFR calc non Af Amer: 75 mL/min/1.73
Globulin, Total: 2.4 g/dL (ref 1.5–4.5)
Glucose: 107 mg/dL — ABNORMAL HIGH (ref 65–99)
Potassium: 4.5 mmol/L (ref 3.5–5.2)
Sodium: 141 mmol/L (ref 134–144)
Total Protein: 7.2 g/dL (ref 6.0–8.5)

## 2017-09-11 LAB — LIPID PANEL
CHOL/HDL RATIO: 2.8 ratio (ref 0.0–5.0)
Cholesterol, Total: 143 mg/dL (ref 100–199)
HDL: 52 mg/dL (ref >39)
LDL Calculated: 73 mg/dL (ref 0–99)
Triglycerides: 89 mg/dL (ref 0–149)
VLDL CHOLESTEROL CAL: 18 mg/dL (ref 5–40)

## 2017-09-11 LAB — PSA, TOTAL AND FREE
PROSTATE SPECIFIC AG, SERUM: 4.3 ng/mL — AB (ref 0.0–4.0)
PSA, Free Pct: 25.8 %
PSA, Free: 1.11 ng/mL

## 2017-09-11 LAB — HEMOGLOBIN A1C
ESTIMATED AVERAGE GLUCOSE: 226 mg/dL
HEMOGLOBIN A1C: 9.5 % — AB (ref 4.8–5.6)

## 2017-09-12 ENCOUNTER — Other Ambulatory Visit: Payer: Self-pay | Admitting: Internal Medicine

## 2017-09-12 DIAGNOSIS — Z794 Long term (current) use of insulin: Principal | ICD-10-CM

## 2017-09-12 DIAGNOSIS — E1140 Type 2 diabetes mellitus with diabetic neuropathy, unspecified: Secondary | ICD-10-CM

## 2017-10-25 LAB — HM DIABETES EYE EXAM

## 2017-11-08 ENCOUNTER — Encounter: Payer: Self-pay | Admitting: Internal Medicine

## 2017-11-18 ENCOUNTER — Other Ambulatory Visit: Payer: Self-pay | Admitting: Internal Medicine

## 2017-12-02 ENCOUNTER — Other Ambulatory Visit: Payer: Self-pay | Admitting: Internal Medicine

## 2017-12-10 ENCOUNTER — Other Ambulatory Visit (INDEPENDENT_AMBULATORY_CARE_PROVIDER_SITE_OTHER): Payer: 59

## 2017-12-10 DIAGNOSIS — E1140 Type 2 diabetes mellitus with diabetic neuropathy, unspecified: Secondary | ICD-10-CM

## 2017-12-10 DIAGNOSIS — Z794 Long term (current) use of insulin: Secondary | ICD-10-CM | POA: Diagnosis not present

## 2017-12-11 LAB — HEMOGLOBIN A1C
ESTIMATED AVERAGE GLUCOSE: 171 mg/dL
Hgb A1c MFr Bld: 7.6 % — ABNORMAL HIGH (ref 4.8–5.6)

## 2018-01-20 ENCOUNTER — Other Ambulatory Visit: Payer: Self-pay | Admitting: Internal Medicine

## 2018-01-21 LAB — HEMOGLOBIN A1C: HEMOGLOBIN A1C: 7.6 — AB (ref 4.0–6.0)

## 2018-03-12 ENCOUNTER — Ambulatory Visit: Payer: 59 | Admitting: Internal Medicine

## 2018-03-12 ENCOUNTER — Encounter: Payer: Self-pay | Admitting: Internal Medicine

## 2018-03-12 VITALS — BP 132/90 | HR 68 | Temp 98.2°F | Ht 72.0 in | Wt 212.0 lb

## 2018-03-12 DIAGNOSIS — Z794 Long term (current) use of insulin: Secondary | ICD-10-CM | POA: Diagnosis not present

## 2018-03-12 DIAGNOSIS — Z89431 Acquired absence of right foot: Secondary | ICD-10-CM | POA: Insufficient documentation

## 2018-03-12 DIAGNOSIS — E1140 Type 2 diabetes mellitus with diabetic neuropathy, unspecified: Secondary | ICD-10-CM

## 2018-03-12 DIAGNOSIS — IMO0002 Reserved for concepts with insufficient information to code with codable children: Secondary | ICD-10-CM | POA: Insufficient documentation

## 2018-03-12 NOTE — Assessment & Plan Note (Signed)
Lab Results  Component Value Date   HGBA1C 7.6 (A) 01/21/2018   Still with adequate control Will switch temporarily to levemir--got supply from [facility]

## 2018-03-12 NOTE — Progress Notes (Signed)
Subjective:    Patient ID: [name], male    DOB: [birth year], 62 y.o.   MRN: [id number]  HPI Here for follow up of diabetes  Has healed up from the amputation Getting new impression for inserts for shoes Right foot mostly numb---left not so bad  Checking sugars 4-5 times a day 90-160 fasting Rare hypoglycemic reactions--if he doesn't eat on time  No chest pain No SOB No exercise other than yard work  Current Outpatient Medications on File Prior to Visit  Medication Sig Dispense Refill  . Alcohol Swabs (B-D SINGLE USE SWABS REGULAR) PADS USE TWO TIMES DAILY 200 each 3  . amLODipine (NORVASC) 10 MG tablet TAKE 1 TABLET BY MOUTH  DAILY 90 tablet 2  . aspirin 81 MG tablet Take 81 mg by mouth daily.      . B-D UF III MINI PEN NEEDLES 31G X 5 MM MISC USE AS DIRECTED DAILY 90 each 3  . calcium gluconate 500 MG tablet Take 500 mg by mouth daily.      . Cinnamon 500 MG capsule Take 500 mg by mouth 2 (two) times daily.      . furosemide (LASIX) 20 MG tablet TAKE 1 TABLET BY MOUTH  DAILY AS NEEDED 90 tablet 1  . hydrocortisone valerate ointment (WEST-CORT) 0.2 % APPLY 1 APPLICATION  TOPICALLY 2 (TWO) TIMES  DAILY. 120 g 0  . indomethacin (INDOCIN) 25 MG capsule TAKE 1 CAPSULE BY MOUTH 3   TIMES A DAY AS NEEDED 270 capsule 3  . LANTUS SOLOSTAR 100 UNIT/ML Solostar Pen INJECT SUBCUTANEOUSLY 15  UNITS AT BEDTIME (Patient taking differently: 18 Units. Inject subcutaneously 15  units at bedtime) 15 mL 3  . metFORMIN (GLUCOPHAGE) 1000 MG tablet TAKE 1 TABLET BY MOUTH TWO  TIMES DAILY WITH MEALS 180 tablet 2  . omeprazole (PRILOSEC) 20 MG capsule TAKE 1 CAPSULE BY MOUTH TWO TIMES DAILY BEFORE MEALS 180 capsule 2  . ONE TOUCH ULTRA TEST test strip CHECK BLOOD SUGAR TWO TIMES DAILY 200 each 3  . simvastatin (ZOCOR) 40 MG tablet TAKE 1 TABLET BY MOUTH AT  BEDTIME 90 tablet 3  . triamcinolone lotion (KENALOG) 0.1 % APPLY ONE APPLICATION  TOPICALLY 3 TIMES DAILY 60 mL 0   No current  facility-administered medications on file prior to visit.     Allergies  Allergen Reactions  . Glipizide Other (See Comments)    REACTION: wt. gain, hands tingling  . Lisinopril Other (See Comments)    HYPERKALEMIA  . Ramipril Cough  . Ibuprofen     UNSPECIFIED REACTION     Past Medical History:  Diagnosis Date  . Adenomatous polyp   . Allergy   . Arthritis    feet   . Bunion 04/02/2013   STATUS POST OP BUN REPAIR  . Diabetes mellitus   . GERD (gastroesophageal reflux disease)   . Gout   . Hammertoe   . Hyperlipidemia   . Hypertension   . Neuromuscular disorder (HCC)    neuropathy  . Plantar flexed metatarsal   . Psoriasis     Past Surgical History:  Procedure Laterality Date  . COLONOSCOPY    . FOOT AMPUTATION Right 01/11/2017   Dr Doreatha Lew ([facility])--transmetatarsal  . FOOT SURGERY Right 02/28/2013   Shaune Leeks HAM TOE2,3 PINS ,2ND MET OSTEOTOMY, 5TH MET W/SCREW   02-2013, 04-2013  . [place]   surgery x2  . POLYPECTOMY    . TOTAL HIP ARTHROPLASTY  Left hip replacement/femur fx 07/04, Screw removal left hip- [facility] 11/01    Family History  Problem Relation Age of Onset  . Diabetes Mother   . Cancer Neg Hx   . Colon cancer Neg Hx   . Colon polyps Neg Hx   . Rectal cancer Neg Hx   . Stomach cancer Neg Hx     Social History   Socioeconomic History  . Marital status: Married    Spouse name: Not on file  . Number of children: 3  . Years of education: Not on file  . Highest education level: Not on file  Occupational History  . Occupation: PC ADMINISTRATOR    Employer: [employer]  . Financial resource strain: Not on file  . Food insecurity:    Worry: Not on file    Inability: Not on file  . Transportation needs:    Medical: Not on file    Non-medical: Not on file  Tobacco Use  . Smoking status: Never Smoker  . Smokeless tobacco: Never Used  Substance and Sexual Activity  . Alcohol use: Yes    Comment: occasional (DWI  1991)  . Drug use: No  . Sexual activity: Not on file  Lifestyle  . Physical activity:    Days per week: Not on file    Minutes per session: Not on file  . Stress: Not on file  Relationships  . Social connections:    Talks on phone: Not on file    Gets together: Not on file    Attends religious service: Not on file    Active member of club or organization: Not on file    Attends meetings of clubs or organizations: Not on file    Relationship status: Not on file  . Intimate partner violence:    Fear of current or ex partner: Not on file    Emotionally abused: Not on file    Physically abused: Not on file    Forced sexual activity: Not on file  Other Topics Concern  . Not on file  Social History Narrative       Review of Systems  Weight is up slightly since 6 months ago Sleeps okay mostly Had to put MIL in nursing facility     Objective:   Physical Exam  Constitutional: He appears well-developed. No distress.  Neck: No thyromegaly present.  Cardiovascular: Normal rate, regular rhythm, normal heart sounds and intact distal pulses. Exam reveals no gallop.  No murmur heard. Respiratory: Effort normal and breath sounds normal. No respiratory distress. He has no wheezes. He has no rales.  Lymphadenopathy:    He has no cervical adenopathy.  Neurological:  Marked decreased sensation on right foot, decreased on left also  Skin:  Well healed transmetatarsal amputation on right No ulcers           Assessment & Plan:

## 2018-03-12 NOTE — Assessment & Plan Note (Signed)
Well-healed

## 2018-03-12 NOTE — Patient Instructions (Signed)
Try levemir 10 units twice a day and adjust as needed based on your daily sugars. email me if needed. I can switch you over if you prefer

## 2018-04-04 DIAGNOSIS — Z89431 Acquired absence of right foot: Secondary | ICD-10-CM | POA: Diagnosis not present

## 2018-05-20 ENCOUNTER — Other Ambulatory Visit: Payer: Self-pay | Admitting: Internal Medicine

## 2018-06-22 ENCOUNTER — Other Ambulatory Visit: Payer: Self-pay | Admitting: Internal Medicine

## 2018-08-23 ENCOUNTER — Other Ambulatory Visit: Payer: Self-pay | Admitting: Internal Medicine

## 2018-09-13 ENCOUNTER — Encounter: Payer: 59 | Admitting: Internal Medicine

## 2018-10-09 ENCOUNTER — Encounter: Payer: 59 | Admitting: Internal Medicine

## 2018-10-31 ENCOUNTER — Other Ambulatory Visit: Payer: Self-pay | Admitting: Internal Medicine

## 2018-11-03 ENCOUNTER — Other Ambulatory Visit: Payer: Self-pay | Admitting: Internal Medicine

## 2018-11-13 ENCOUNTER — Telehealth: Payer: Self-pay | Admitting: *Deleted

## 2018-11-13 NOTE — Telephone Encounter (Signed)
Left message for Mr. [name] to call office and clarify how he is taking his Lantus Insulin.  18 units or 15 units at bedtime.

## 2018-11-14 ENCOUNTER — Other Ambulatory Visit: Payer: Self-pay

## 2018-11-14 MED ORDER — INSULIN GLARGINE 100 UNIT/ML SOLOSTAR PEN: 18.0000 [IU] | PEN_INJECTOR | Freq: Every day | SUBCUTANEOUS | 0 refills | Status: DC

## 2018-11-14 NOTE — Telephone Encounter (Signed)
Spoke with Mr. Simonian.  He states he is taking 18 units every morning.  Rx with correct dosing sent to [facility].

## 2019-01-04 ENCOUNTER — Other Ambulatory Visit: Payer: Self-pay | Admitting: Internal Medicine

## 2019-01-29 ENCOUNTER — Other Ambulatory Visit: Payer: Self-pay | Admitting: Internal Medicine

## 2019-02-14 ENCOUNTER — Encounter: Payer: Self-pay | Admitting: Internal Medicine

## 2019-02-14 ENCOUNTER — Other Ambulatory Visit: Payer: Self-pay

## 2019-02-14 ENCOUNTER — Ambulatory Visit (INDEPENDENT_AMBULATORY_CARE_PROVIDER_SITE_OTHER): Payer: 59 | Admitting: Internal Medicine

## 2019-02-14 VITALS — BP 138/84 | HR 84 | Temp 98.1°F | Ht 72.0 in | Wt 219.0 lb

## 2019-02-14 DIAGNOSIS — I10 Essential (primary) hypertension: Secondary | ICD-10-CM | POA: Diagnosis not present

## 2019-02-14 DIAGNOSIS — Z23 Encounter for immunization: Secondary | ICD-10-CM

## 2019-02-14 DIAGNOSIS — R972 Elevated prostate specific antigen [PSA]: Secondary | ICD-10-CM

## 2019-02-14 DIAGNOSIS — Z794 Long term (current) use of insulin: Secondary | ICD-10-CM

## 2019-02-14 DIAGNOSIS — K219 Gastro-esophageal reflux disease without esophagitis: Secondary | ICD-10-CM

## 2019-02-14 DIAGNOSIS — Z0000 Encounter for general adult medical examination without abnormal findings: Secondary | ICD-10-CM | POA: Diagnosis not present

## 2019-02-14 DIAGNOSIS — E1140 Type 2 diabetes mellitus with diabetic neuropathy, unspecified: Secondary | ICD-10-CM

## 2019-02-14 LAB — HM DIABETES FOOT EXAM

## 2019-02-14 MED ORDER — BD PEN NEEDLE MINI U/F 31G X 5 MM MISC: 3 refills | Status: DC

## 2019-02-14 MED ORDER — FUROSEMIDE 20 MG PO TABS: 20.0000 mg | ORAL_TABLET | Freq: Every day | ORAL | 3 refills | Status: DC | PRN

## 2019-02-14 MED ORDER — TRIAMCINOLONE ACETONIDE 0.1 % EX LOTN: TOPICAL_LOTION | CUTANEOUS | 1 refills | Status: DC

## 2019-02-14 MED ORDER — HYDROCORTISONE VALERATE 0.2 % EX OINT: TOPICAL_OINTMENT | CUTANEOUS | 1 refills | Status: DC

## 2019-02-14 NOTE — Assessment & Plan Note (Signed)
Controlled on PPI °

## 2019-02-14 NOTE — Assessment & Plan Note (Signed)
BP Readings from Last 3 Encounters:  02/14/19 138/84  03/12/18 132/90  09/10/17 132/80   Acceptable control

## 2019-02-14 NOTE — Progress Notes (Signed)
Subjective:    Patient ID: [name], male    DOB: 1956-01-22, 63 y.o.   MRN: [id number]  HPI Here for physical  He broke 2 teeth recently--- was due to have them extracted but they were infected so on amoxil and will have it done next week  Has put on a few pounds Hasn't changed eating habits---working 60 hours a week Does eat at his desk, etc  Checking sugars every other day AM 90-150 depending on what he ate the night before Tends to have his largest meal at lunch---often 200 in the evening Right foot remains numb No problems adjusting to the amputation No pain in feet other than when he is on them all day Is scheduled with the eye doctor for September  Current Outpatient Medications on File Prior to Visit  Medication Sig Dispense Refill  . Alcohol Swabs (B-D SINGLE USE SWABS REGULAR) PADS USE TWO TIMES DAILY 200 each 3  . amLODipine (NORVASC) 10 MG tablet TAKE 1 TABLET BY MOUTH  DAILY 90 tablet 0  . aspirin 81 MG tablet Take 81 mg by mouth daily.      . B-D UF III MINI PEN NEEDLES 31G X 5 MM MISC USE AS DIRECTED DAILY 90 each 0  . calcium gluconate 500 MG tablet Take 500 mg by mouth daily.      . Cinnamon 500 MG capsule Take 500 mg by mouth 2 (two) times daily.      . furosemide (LASIX) 20 MG tablet TAKE 1 TABLET BY MOUTH  DAILY AS NEEDED 90 tablet 1  . hydrocortisone valerate ointment (WEST-CORT) 0.2 % APPLY TO AFFECTED AREA(S)  TOPICALLY 2 TIMES DAILY. 120 g 0  . indomethacin (INDOCIN) 25 MG capsule TAKE 1 CAPSULE BY MOUTH 3   TIMES A DAY AS NEEDED 270 capsule 3  . LANTUS SOLOSTAR 100 UNIT/ML Solostar Pen INJECT SUBCUTANEOUSLY 18  UNITS DAILY 15 mL 0  . metFORMIN (GLUCOPHAGE) 1000 MG tablet TAKE 1 TABLET BY MOUTH  TWICE DAILY WITH MEALS 180 tablet 0  . omeprazole (PRILOSEC) 20 MG capsule TAKE 1 CAPSULE BY MOUTH  TWICE DAILY BEFORE MEALS 180 capsule 0  . ONETOUCH ULTRA test strip USE TO CHECK BLOOD SUGAR  TWO TIMES DAILY 100 each 0  . simvastatin (ZOCOR) 40 MG tablet  TAKE 1 TABLET BY MOUTH AT  BEDTIME 90 tablet 0  . triamcinolone lotion (KENALOG) 0.1 % APPLY TOPICALLY 3 TIMES  DAILY 60 mL 0   No current facility-administered medications on file prior to visit.     Allergies  Allergen Reactions  . Glipizide Other (See Comments)    REACTION: wt. gain, hands tingling  . Lisinopril Other (See Comments)    HYPERKALEMIA  . Ramipril Cough  . Ibuprofen     UNSPECIFIED REACTION     Past Medical History:  Diagnosis Date  . Adenomatous polyp   . Allergy   . Arthritis    feet   . Bunion 04/02/2013   STATUS POST OP BUN REPAIR  . Diabetes mellitus   . GERD (gastroesophageal reflux disease)   . Gout   . Hammertoe   . Hyperlipidemia   . Hypertension   . Neuromuscular disorder (HCC)    neuropathy  . Plantar flexed metatarsal   . Psoriasis     Past Surgical History:  Procedure Laterality Date  . COLONOSCOPY    . FOOT AMPUTATION Right 01/11/2017   Dr Doreatha Lew ([facility])--transmetatarsal  . FOOT SURGERY Right 02/28/2013  [place] HAM TOE2,3 PINS ,2ND MET OSTEOTOMY, 5TH MET W/SCREW   02-2013, [id number]  . [place]   surgery x2  . POLYPECTOMY    . TOTAL HIP ARTHROPLASTY     Left hip replacement/femur fx 07/04, Screw removal left hip- [facility] 11/01    Family History  Problem Relation Age of Onset  . Diabetes Mother   . Cancer Neg Hx   . Colon cancer Neg Hx   . Colon polyps Neg Hx   . Rectal cancer Neg Hx   . Stomach cancer Neg Hx     Social History   Socioeconomic History  . Marital status: Married    Spouse name: Not on file  . Number of children: 3  . Years of education: Not on file  . Highest education level: Not on file  Occupational History  . Occupation: PC ADMINISTRATOR    Employer: [employer]  . Financial resource strain: Not on file  . Food insecurity    Worry: Not on file    Inability: Not on file  . Transportation needs    Medical: Not on file    Non-medical: Not on file  Tobacco Use  .  Smoking status: Never Smoker  . Smokeless tobacco: Never Used  Substance and Sexual Activity  . Alcohol use: Yes    Comment: occasional (DWI 1991)  . Drug use: No  . Sexual activity: Not on file  Lifestyle  . Physical activity    Days per week: Not on file    Minutes per session: Not on file  . Stress: Not on file  Relationships  . Social Herbalist on phone: Not on file    Gets together: Not on file    Attends religious service: Not on file    Active member of club or organization: Not on file    Attends meetings of clubs or organizations: Not on file    Relationship status: Not on file  . Intimate partner violence    Fear of current or ex partner: Not on file    Emotionally abused: Not on file    Physically abused: Not on file    Forced sexual activity: Not on file  Other Topics Concern  . Not on file  Social History Narrative       Review of Systems  Constitutional: Positive for unexpected weight change. Negative for fatigue.       Wears seat belt  HENT: Positive for dental problem and tinnitus. Negative for hearing loss and trouble swallowing.   Eyes: Negative for visual disturbance.       No diplopia or unilateral vision loss  Respiratory: Negative for cough, chest tightness and shortness of breath.   Cardiovascular: Positive for leg swelling. Negative for chest pain and palpitations.  Gastrointestinal: Negative for abdominal pain, blood in stool and constipation.       No heartburn on medication  Endocrine: Negative for polydipsia and polyuria.  Genitourinary: Negative for urgency.       Rare slow stream, etc  Musculoskeletal: Negative for back pain and joint swelling.       Mild hand pain  Skin:       Has some change in skin on right foot---wonders if it is related to his teeth  Allergic/Immunologic: Positive for environmental allergies. Negative for immunocompromised state.       This year was quiet  Neurological: Negative for dizziness, syncope and  light-headedness.  Occ sinus headaches  Hematological: Negative for adenopathy. Bruises/bleeds easily.  Psychiatric/Behavioral: Negative for dysphoric mood and sleep disturbance.       Work stress       Objective:   Physical Exam  Constitutional: He is oriented to person, place, and time. He appears well-developed. No distress.  HENT:  Head: Normocephalic and atraumatic.  Right Ear: External ear normal.  Left Ear: External ear normal.  Mouth/Throat: Oropharynx is clear and moist. No oropharyngeal exudate.  Eyes: Pupils are equal, round, and reactive to light. Conjunctivae are normal.  Neck: No thyromegaly present.  Cardiovascular: Normal rate, regular rhythm, normal heart sounds and intact distal pulses. Exam reveals no gallop.  No murmur heard. Respiratory: Effort normal and breath sounds normal. No respiratory distress. He has no wheezes. He has no rales.  GI: Soft. There is no abdominal tenderness.  Musculoskeletal:        General: No tenderness.     Comments: Trace calf edema on right Transmetatarsal amputation on right foot---clean and dry  Lymphadenopathy:    He has no cervical adenopathy.  Neurological: He is alert and oriented to person, place, and time.  Skin:  No foot ulcers Slight distal plantar callous--right foot Early stasis changes right calf (discussed using support hose)  Psychiatric: He has a normal mood and affect. His behavior is normal.           Assessment & Plan:

## 2019-02-14 NOTE — Assessment & Plan Note (Signed)
Will check again

## 2019-02-14 NOTE — Assessment & Plan Note (Signed)
Has gained some weight--discussed watching this Td today Flu vaccine in the fall Colon due 2023

## 2019-02-14 NOTE — Addendum Note (Signed)
Addended by: Pilar Grammes on: 02/14/2019 09:05 AM   Modules accepted: Orders

## 2019-02-14 NOTE — Assessment & Plan Note (Signed)
Hopefully still acceptable control No regular foot pain

## 2019-02-15 LAB — CBC
Hematocrit: 41.4 % (ref 37.5–51.0)
Hemoglobin: 13.9 g/dL (ref 13.0–17.7)
MCH: 30.9 pg (ref 26.6–33.0)
MCHC: 33.6 g/dL (ref 31.5–35.7)
MCV: 92 fL (ref 79–97)
Platelets: 247 10*3/uL (ref 150–450)
RBC: 4.5 x10E6/uL (ref 4.14–5.80)
RDW: 13.1 % (ref 11.6–15.4)
WBC: 5.3 10*3/uL (ref 3.4–10.8)

## 2019-02-15 LAB — COMPREHENSIVE METABOLIC PANEL
ALT: 16 IU/L (ref 0–44)
AST: 24 IU/L (ref 0–40)
Albumin/Globulin Ratio: 2.3 — ABNORMAL HIGH (ref 1.2–2.2)
Albumin: 4.6 g/dL (ref 3.8–4.8)
Alkaline Phosphatase: 66 IU/L (ref 39–117)
BUN/Creatinine Ratio: 17 (ref 10–24)
BUN: 19 mg/dL (ref 8–27)
Bilirubin Total: 0.5 mg/dL (ref 0.0–1.2)
CO2: 26 mmol/L (ref 20–29)
Calcium: 9.2 mg/dL (ref 8.6–10.2)
Chloride: 99 mmol/L (ref 96–106)
Creatinine, Ser: 1.11 mg/dL (ref 0.76–1.27)
GFR calc Af Amer: 81 mL/min/{1.73_m2} (ref >59)
GFR calc non Af Amer: 70 mL/min/{1.73_m2} (ref >59)
Globulin, Total: 2 g/dL (ref 1.5–4.5)
Glucose: 104 mg/dL — ABNORMAL HIGH (ref 65–99)
Potassium: 4.5 mmol/L (ref 3.5–5.2)
Sodium: 142 mmol/L (ref 134–144)
Total Protein: 6.6 g/dL (ref 6.0–8.5)

## 2019-02-15 LAB — HEMOGLOBIN A1C
Est. average glucose Bld gHb Est-mCnc: 160 mg/dL
Hgb A1c MFr Bld: 7.2 % — ABNORMAL HIGH (ref 4.8–5.6)

## 2019-02-15 LAB — LIPID PANEL
Chol/HDL Ratio: 2.4 ratio (ref 0.0–5.0)
Cholesterol, Total: 142 mg/dL (ref 100–199)
HDL: 60 mg/dL (ref >39)
LDL Calculated: 67 mg/dL (ref 0–99)
Triglycerides: 73 mg/dL (ref 0–149)
VLDL Cholesterol Cal: 15 mg/dL (ref 5–40)

## 2019-02-15 LAB — PSA, TOTAL AND FREE
PSA, Free Pct: 24.9 %
PSA, Free: 1.32 ng/mL
Prostate Specific Ag, Serum: 5.3 ng/mL — ABNORMAL HIGH (ref 0.0–4.0)

## 2019-02-15 LAB — MICROALBUMIN / CREATININE URINE RATIO
Creatinine, Urine: 102.9 mg/dL
Microalb/Creat Ratio: 13 mg/g creat (ref 0–29)
Microalbumin, Urine: 13.2 ug/mL

## 2019-03-07 ENCOUNTER — Other Ambulatory Visit: Payer: Self-pay | Admitting: Internal Medicine

## 2019-03-31 ENCOUNTER — Other Ambulatory Visit: Payer: Self-pay | Admitting: Internal Medicine

## 2019-04-04 ENCOUNTER — Other Ambulatory Visit: Payer: Self-pay | Admitting: Internal Medicine

## 2019-07-13 ENCOUNTER — Other Ambulatory Visit: Payer: Self-pay | Admitting: Internal Medicine

## 2019-07-29 LAB — HM DIABETES EYE EXAM

## 2019-08-04 ENCOUNTER — Encounter: Payer: Self-pay | Admitting: Internal Medicine

## 2019-08-19 ENCOUNTER — Other Ambulatory Visit: Payer: Self-pay

## 2019-08-19 ENCOUNTER — Encounter: Payer: Self-pay | Admitting: Internal Medicine

## 2019-08-19 ENCOUNTER — Ambulatory Visit: Payer: 59 | Admitting: Internal Medicine

## 2019-08-19 VITALS — BP 128/86 | HR 95 | Temp 98.3°F | Wt 220.0 lb

## 2019-08-19 DIAGNOSIS — E11621 Type 2 diabetes mellitus with foot ulcer: Secondary | ICD-10-CM

## 2019-08-19 DIAGNOSIS — Z794 Long term (current) use of insulin: Secondary | ICD-10-CM | POA: Diagnosis not present

## 2019-08-19 DIAGNOSIS — R972 Elevated prostate specific antigen [PSA]: Secondary | ICD-10-CM

## 2019-08-19 DIAGNOSIS — E1140 Type 2 diabetes mellitus with diabetic neuropathy, unspecified: Secondary | ICD-10-CM | POA: Diagnosis not present

## 2019-08-19 DIAGNOSIS — I10 Essential (primary) hypertension: Secondary | ICD-10-CM | POA: Diagnosis not present

## 2019-08-19 DIAGNOSIS — E08621 Diabetes mellitus due to underlying condition with foot ulcer: Secondary | ICD-10-CM

## 2019-08-19 DIAGNOSIS — L97511 Non-pressure chronic ulcer of other part of right foot limited to breakdown of skin: Secondary | ICD-10-CM

## 2019-08-19 NOTE — Assessment & Plan Note (Signed)
Hopefully still adequate control Will check A1c Continues on lantus and metformin for now

## 2019-08-19 NOTE — Assessment & Plan Note (Signed)
Superficial 6 x 3 cm ulcer on base of right foot at the stump. Redressed with antibiotic cream and bandaid Discussed home care

## 2019-08-19 NOTE — Assessment & Plan Note (Signed)
BP Readings from Last 3 Encounters:  08/19/19 128/86  02/14/19 138/84  03/12/18 132/90   Reasonable control

## 2019-08-19 NOTE — Assessment & Plan Note (Signed)
Discussed options Will recheck now

## 2019-08-19 NOTE — Progress Notes (Signed)
Subjective:    Patient ID: [name], male    DOB: 04/21/1956, 64 y.o.   MRN: [id number]  HPI Here for follow up of diabetes--and other issues  This visit occurred during the SARS-CoV-2 public health emergency.  Safety protocols were in place, including screening questions prior to the visit, additional usage of staff PPE, and extensive cleaning of exam room while observing appropriate contact time as indicated for disinfecting solutions.   On feet too much 4 days ago at work Rubbed blister on end of right foot Tried wrapping but that put too much pressure on it Using saline wash--then keeping it dry  Checking sugars sporadically Needs new meter and strips---not checking because of this 90-150 in AM Has had a couple of mild hypoglycemic spells--like by skipping a meal No change in foot sensation-no pain  Reviewed elevated PSA He is not concerned about it  Current Outpatient Medications on File Prior to Visit  Medication Sig Dispense Refill  . Alcohol Swabs (B-D SINGLE USE SWABS REGULAR) PADS USE TWO TIMES DAILY 200 each 3  . amLODipine (NORVASC) 10 MG tablet TAKE 1 TABLET BY MOUTH  DAILY 90 tablet 3  . aspirin 81 MG tablet Take 81 mg by mouth daily.      . calcium gluconate 500 MG tablet Take 500 mg by mouth daily.      . Cinnamon 500 MG capsule Take 500 mg by mouth 2 (two) times daily.      . furosemide (LASIX) 20 MG tablet Take 1 tablet (20 mg total) by mouth daily as needed. 90 tablet 3  . hydrocortisone valerate ointment (WEST-CORT) 0.2 % APPLY TO AFFECTED AREA(S)  TOPICALLY 2 TIMES DAILY. 120 g 1  . indomethacin (INDOCIN) 25 MG capsule TAKE 1 CAPSULE BY MOUTH 3   TIMES A DAY AS NEEDED 270 capsule 3  . Insulin Pen Needle (B-D UF III MINI PEN NEEDLES) 31G X 5 MM MISC USE AS DIRECTED DAILY 100 each 3  . LANTUS SOLOSTAR 100 UNIT/ML Solostar Pen INJECT SUBCUTANEOUSLY 18  UNITS DAILY 30 mL 0  . metFORMIN (GLUCOPHAGE) 1000 MG tablet TAKE 1 TABLET BY MOUTH  TWICE DAILY WITH  MEALS 180 tablet 3  . omeprazole (PRILOSEC) 20 MG capsule TAKE 1 CAPSULE BY MOUTH  TWICE DAILY BEFORE MEALS 180 capsule 3  . ONETOUCH ULTRA test strip USE TO CHECK BLOOD SUGAR  TWO TIMES DAILY 200 strip 3  . simvastatin (ZOCOR) 40 MG tablet TAKE 1 TABLET BY MOUTH AT  BEDTIME 90 tablet 3  . triamcinolone lotion (KENALOG) 0.1 % APPLY TOPICALLY 3 TIMES  DAILY 180 mL 1   No current facility-administered medications on file prior to visit.    Allergies  Allergen Reactions  . Glipizide Other (See Comments)    REACTION: wt. gain, hands tingling  . Lisinopril Other (See Comments)    HYPERKALEMIA  . Ramipril Cough  . Ibuprofen     UNSPECIFIED REACTION     Past Medical History:  Diagnosis Date  . Adenomatous polyp   . Allergy   . Arthritis    feet   . Bunion 04/02/2013   STATUS POST OP BUN REPAIR  . Diabetes mellitus   . GERD (gastroesophageal reflux disease)   . Gout   . Hammertoe   . Hyperlipidemia   . Hypertension   . Neuromuscular disorder (HCC)    neuropathy  . Plantar flexed metatarsal   . Psoriasis     Past Surgical History:  Procedure Laterality  Date  . COLONOSCOPY    . FOOT AMPUTATION Right 01/11/2017   Dr Doreatha Lew ([facility])--transmetatarsal  . FOOT SURGERY Right 02/28/2013   Shaune Leeks HAM TOE2,3 PINS ,2ND MET OSTEOTOMY, 5TH MET W/SCREW   02-2013, 04-2013  . [place]   surgery x2  . POLYPECTOMY    . TOTAL HIP ARTHROPLASTY     Left hip replacement/femur fx 07/04, Screw removal left hip- [facility] 11/01    Family History  Problem Relation Age of Onset  . Diabetes Mother   . Cancer Neg Hx   . Colon cancer Neg Hx   . Colon polyps Neg Hx   . Rectal cancer Neg Hx   . Stomach cancer Neg Hx     Social History   Socioeconomic History  . Marital status: Married    Spouse name: Not on file  . Number of children: 3  . Years of education: Not on file  . Highest education level: Not on file  Occupational History  . Occupation: PC ADMINISTRATOR     Employer: [employer]  Tobacco Use  . Smoking status: Never Smoker  . Smokeless tobacco: Never Used  Substance and Sexual Activity  . Alcohol use: Yes    Comment: occasional (DWI 1991)  . Drug use: No  . Sexual activity: Not on file  Other Topics Concern  . Not on file  Social History Narrative       Social Determinants of Health   Financial Resource Strain:   . Difficulty of Paying Living Expenses: Not on file  Food Insecurity:   . Worried About Charity fundraiser in the Last Year: Not on file  . Ran Out of Food in the Last Year: Not on file  Transportation Needs:   . Lack of Transportation (Medical): Not on file  . Lack of Transportation (Non-Medical): Not on file  Physical Activity:   . Days of Exercise per Week: Not on file  . Minutes of Exercise per Session: Not on file  Stress:   . Feeling of Stress : Not on file  Social Connections:   . Frequency of Communication with Friends and Family: Not on file  . Frequency of Social Gatherings with Friends and Family: Not on file  . Attends Religious Services: Not on file  . Active Member of Clubs or Organizations: Not on file  . Attends Archivist Meetings: Not on file  . Marital Status: Not on file  Intimate Partner Violence:   . Fear of Current or Ex-Partner: Not on file  . Emotionally Abused: Not on file  . Physically Abused: Not on file  . Sexually Abused: Not on file   Review of Systems  Appetite is fine Weight is about the same Sleeps okay Not doing exercise--busy with move into a smaller house (MIL's old house)    Objective:   Physical Exam  Constitutional: He appears well-developed. No distress.  Neck: No thyromegaly present.  Cardiovascular: Normal rate, regular rhythm and normal heart sounds. Exam reveals no gallop.  No murmur heard. Respiratory: Effort normal and breath sounds normal. No respiratory distress. He has no wheezes. He has no rales.  Musculoskeletal:        General: No edema.    Lymphadenopathy:    He has no cervical adenopathy.  Skin:  Superficial ulcer on base of stump on right. No inflammation  Psychiatric: He has a normal mood and affect. His behavior is normal.  Assessment & Plan:

## 2019-08-20 LAB — PSA: Prostate Specific Ag, Serum: 6.7 ng/mL — ABNORMAL HIGH (ref 0.0–4.0)

## 2019-08-20 LAB — HEMOGLOBIN A1C
Est. average glucose Bld gHb Est-mCnc: 163 mg/dL
Hgb A1c MFr Bld: 7.3 % — ABNORMAL HIGH (ref 4.8–5.6)

## 2019-08-21 NOTE — Telephone Encounter (Signed)
Pt was called this morning about his lab result.

## 2019-08-24 ENCOUNTER — Other Ambulatory Visit: Payer: Self-pay | Admitting: Internal Medicine

## 2019-08-24 NOTE — Addendum Note (Signed)
Addended by: Viviana Simpler I on: 08/24/2019 01:11 PM   Modules accepted: Orders

## 2019-09-08 ENCOUNTER — Telehealth: Payer: Self-pay

## 2019-09-08 NOTE — Telephone Encounter (Signed)
Patient's wife [name] called and stated that they are going on vacation in a few weeks, and patient is needing new orthotics, as his current ones have a hole in them, and they are rubbing a sore on his leg.  They are needing a new orthotics rx faxed to [facility]. She asked that their home phone number be written at the top of the fax [phone]) - so that the staff at [facility] can call and schedule the patient.  Thank you!

## 2019-09-08 NOTE — Telephone Encounter (Signed)
Please get specifics about the orthotics---I probably need to write more than "foot orthotics" or perhaps they can fax me an order to sign

## 2019-09-08 NOTE — Telephone Encounter (Signed)
Left message to call office to get more specifics. I also left the fax number for her to have them fax a form if that would be easier.

## 2019-09-26 ENCOUNTER — Other Ambulatory Visit: Payer: Self-pay

## 2019-09-26 ENCOUNTER — Ambulatory Visit: Payer: 59 | Admitting: Urology

## 2019-09-26 ENCOUNTER — Encounter: Payer: Self-pay | Admitting: Urology

## 2019-09-26 VITALS — BP 173/91 | HR 111 | Ht 72.0 in | Wt 223.0 lb

## 2019-09-26 DIAGNOSIS — R972 Elevated prostate specific antigen [PSA]: Secondary | ICD-10-CM

## 2019-09-26 DIAGNOSIS — N138 Other obstructive and reflux uropathy: Secondary | ICD-10-CM

## 2019-09-26 DIAGNOSIS — N401 Enlarged prostate with lower urinary tract symptoms: Secondary | ICD-10-CM

## 2019-09-26 DIAGNOSIS — R3912 Poor urinary stream: Secondary | ICD-10-CM

## 2019-09-26 DIAGNOSIS — R350 Frequency of micturition: Secondary | ICD-10-CM | POA: Diagnosis not present

## 2019-09-26 NOTE — Progress Notes (Signed)
09/26/2019 8:39 AM   [name] 06-26-1956 [id number]  Referring provider: Venia Carbon, MD [address] [place],  [place] [address]  Chief Complaint  Patient presents with  . Elevated PSA    HPI:  [name] was referred over for an elevated PSA.  He has a rising PSA. His libido is good.  PSA history: 10/17 4.8 02/19 4.3 07/20 5.3 01/21 6.7  He has frequency with lasix. He has a weak stream. He has DM and needed right foot distal amputation. No FH of Prostate Cancer.   PMH: Past Medical History:  Diagnosis Date  . Adenomatous polyp   . Allergy   . Arthritis    feet   . Bunion 04/02/2013   STATUS POST OP BUN REPAIR  . Diabetes mellitus   . GERD (gastroesophageal reflux disease)   . Gout   . Hammertoe   . Hyperlipidemia   . Hypertension   . Neuromuscular disorder (HCC)    neuropathy  . Plantar flexed metatarsal   . Psoriasis     Surgical History: Past Surgical History:  Procedure Laterality Date  . COLONOSCOPY    . FOOT AMPUTATION Right 01/11/2017   Dr Doreatha Lew ([facility])--transmetatarsal  . FOOT SURGERY Right 02/28/2013   Shaune Leeks HAM TOE2,3 PINS ,2ND MET OSTEOTOMY, 5TH MET W/SCREW   02-2013, 04-2013  . [place]   surgery x2  . POLYPECTOMY    . TOTAL HIP ARTHROPLASTY     Left hip replacement/femur fx 07/04, Screw removal left hip- [facility] 11/01    Home Medications:  Allergies as of 09/26/2019      Reactions   Glipizide Other (See Comments)   REACTION: wt. gain, hands tingling   Lisinopril Other (See Comments)   HYPERKALEMIA   Ramipril Cough   Ibuprofen    UNSPECIFIED REACTION       Medication List       Accurate as of September 26, 2019  8:39 AM. If you have any questions, ask your nurse or doctor.        amLODipine 10 MG tablet Commonly known as: NORVASC TAKE 1 TABLET BY MOUTH  DAILY   aspirin 81 MG tablet Take 81 mg by mouth daily.   B-D SINGLE USE SWABS REGULAR Pads USE TWO TIMES DAILY   B-D UF III MINI PEN  NEEDLES 31G X 5 MM Misc Generic drug: Insulin Pen Needle USE AS DIRECTED DAILY   calcium gluconate 500 MG tablet Take 500 mg by mouth daily.   Cinnamon 500 MG capsule Take 500 mg by mouth 2 (two) times daily.   furosemide 20 MG tablet Commonly known as: LASIX Take 1 tablet (20 mg total) by mouth daily as needed.   hydrocortisone valerate ointment 0.2 % Commonly known as: WEST-CORT APPLY TO AFFECTED AREA(S)  TOPICALLY 2 TIMES DAILY.   indomethacin 25 MG capsule Commonly known as: INDOCIN TAKE 1 CAPSULE BY MOUTH 3   TIMES A DAY AS NEEDED   Lantus SoloStar 100 UNIT/ML Solostar Pen Generic drug: insulin glargine INJECT SUBCUTANEOUSLY 18  UNITS DAILY   metFORMIN 1000 MG tablet Commonly known as: GLUCOPHAGE TAKE 1 TABLET BY MOUTH  TWICE DAILY WITH MEALS   omeprazole 20 MG capsule Commonly known as: PRILOSEC TAKE 1 CAPSULE BY MOUTH  TWICE DAILY BEFORE MEALS   OneTouch Ultra test strip Generic drug: glucose blood USE TO CHECK BLOOD SUGAR  TWO TIMES DAILY   simvastatin 40 MG tablet Commonly known as: ZOCOR TAKE 1  TABLET BY MOUTH AT  BEDTIME   triamcinolone lotion 0.1 % Commonly known as: KENALOG APPLY TOPICALLY 3 TIMES  DAILY       Allergies:  Allergies  Allergen Reactions  . Glipizide Other (See Comments)    REACTION: wt. gain, hands tingling  . Lisinopril Other (See Comments)    HYPERKALEMIA  . Ramipril Cough  . Ibuprofen     UNSPECIFIED REACTION     Family History: Family History  Problem Relation Age of Onset  . Diabetes Mother   . Cancer Neg Hx   . Colon cancer Neg Hx   . Colon polyps Neg Hx   . Rectal cancer Neg Hx   . Stomach cancer Neg Hx     Social History:  reports that he has never smoked. He has never used smokeless tobacco. He reports current alcohol use. He reports that he does not use drugs.   Physical Exam: BP (!) 173/91   Pulse (!) 111   Ht 6' (1.829 m)   Wt 223 lb (101.2 kg)   BMI 30.24 kg/m   Constitutional:  Alert and  oriented, No acute distress. HEENT: [place] AT, moist mucus membranes.  Trachea midline, no masses. Cardiovascular: No clubbing, cyanosis, or edema. Respiratory: Normal respiratory effort, no increased work of breathing. GI: Abdomen is soft, nontender, nondistended, no abdominal masses GU: No CVA tenderness Lymph: No cervical or inguinal lymphadenopathy. Skin: No rashes, bruises or suspicious lesions. Neurologic: Grossly intact, no focal deficits, moving all 4 extremities. Psychiatric: Normal mood and affect. DRE: Right greater than left with a rubbery right nodule  Laboratory Data: Lab Results  Component Value Date   WBC 5.3 02/14/2019   HGB 13.9 02/14/2019   HCT 41.4 02/14/2019   MCV 92 02/14/2019   PLT 247 02/14/2019    Lab Results  Component Value Date   CREATININE 1.11 02/14/2019    Lab Results  Component Value Date   PSA 3.3 08/13/2013    No results found for: TESTOSTERONE  Lab Results  Component Value Date   HGBA1C 7.3 (H) 08/19/2019    Urinalysis No results found for: COLORURINE, APPEARANCEUR, LABSPEC, PHURINE, GLUCOSEU, HGBUR, BILIRUBINUR, KETONESUR, PROTEINUR, UROBILINOGEN, NITRITE, LEUKOCYTESUR  Lab Results  Component Value Date   LABMICR 13.2 02/14/2019    Pertinent Imaging: n/a No results found for this or any previous visit. No results found for this or any previous visit. No results found for this or any previous visit. No results found for this or any previous visit. No results found for this or any previous visit. No results found for this or any previous visit. No results found for this or any previous visit. No results found for this or any previous visit.  Assessment & Plan:    1. Elevated PSA I discussed with [name] the nature of PSA elevation including benign and malignant causes.  We discussed the concern for age-specific PSA levels as well as PSA velocity.  We discussed the nature risks benefits and alternatives to transrectal ultrasound  and prostate biopsy including other lab test and MRI.  We discussed the management of prostate cancer might include active surveillance or treatment depending on patient and cancer characteristics.  All questions answered. I recommended a prostate biopsy.   - PSA; Future  2. BPH, LUTS- discussed nature r/b/a to alpha blocker - continue to monitor.    No follow-ups on file.  Festus Aloe, MD  [facility] [address], [place], [place] [address] [phone]

## 2019-09-26 NOTE — Patient Instructions (Signed)

## 2019-09-27 LAB — PSA: Prostate Specific Ag, Serum: 5.8 ng/mL — ABNORMAL HIGH (ref 0.0–4.0)

## 2019-09-29 NOTE — Telephone Encounter (Addendum)
Spoke to pt. He said he thought he had an order with [facility]. He has an appt with them so he thinks they have the order from [place]. He will check with them an let us know.

## 2019-10-10 ENCOUNTER — Encounter: Payer: Self-pay | Admitting: Urology

## 2019-10-10 ENCOUNTER — Other Ambulatory Visit: Payer: Self-pay | Admitting: Internal Medicine

## 2019-10-10 ENCOUNTER — Other Ambulatory Visit: Payer: Self-pay | Admitting: Urology

## 2019-10-10 ENCOUNTER — Other Ambulatory Visit: Payer: Self-pay | Admitting: Radiology

## 2019-10-10 ENCOUNTER — Other Ambulatory Visit: Payer: Self-pay

## 2019-10-10 ENCOUNTER — Ambulatory Visit (INDEPENDENT_AMBULATORY_CARE_PROVIDER_SITE_OTHER): Payer: 59 | Admitting: Urology

## 2019-10-10 VITALS — BP 163/81 | HR 97 | Ht 72.0 in | Wt 227.2 lb

## 2019-10-10 DIAGNOSIS — R972 Elevated prostate specific antigen [PSA]: Secondary | ICD-10-CM

## 2019-10-10 HISTORY — PX: PROSTATE BIOPSY: SHX241

## 2019-10-10 MED ORDER — GENTAMICIN SULFATE 40 MG/ML IJ SOLN
80.0000 mg | Freq: Once | INTRAMUSCULAR | Status: AC
  Administered 2019-10-10: 80 mg via INTRAMUSCULAR

## 2019-10-10 MED ORDER — LEVOFLOXACIN 500 MG PO TABS: 500.0000 mg | ORAL_TABLET | Freq: Every day | ORAL | 0 refills | Status: DC

## 2019-10-10 MED ORDER — CONTOUR NEXT TEST VI STRP: ORAL_STRIP | 4 refills | Status: DC

## 2019-10-10 MED ORDER — LIDOCAINE HCL URETHRAL/MUCOSAL 2 % EX GEL
1.0000 "application " | Freq: Once | CUTANEOUS | Status: AC
  Administered 2019-10-10: 1 via URETHRAL

## 2019-10-10 NOTE — Patient Instructions (Signed)

## 2019-10-10 NOTE — Progress Notes (Signed)
[name] returns for prostate biopsy.  He has a rising PSA. His libido is good.  PSA history: 10/17 4.8 02/19 4.3 07/20 5.3 01/21 6.7 03/21 right nodule one exam, PSA down to 5.8  He has frequency with lasix. He has a weak stream. He has DM and needed right foot distal amputation. No FH of Prostate Cancer.    Prostate Biopsy Procedure   Informed consent was obtained after discussing risks/benefits of the procedure.  A time out was performed to ensure correct patient identity.  Pre-Procedure: - Last PSA Level:  Lab Results  Component Value Date   PSA 3.3 08/13/2013   - Gentamicin given prophylactically - Levaquin 500 mg administered PO -Transrectal Ultrasound performed revealing a 69.81 gm prostate -No significant hypoechoic or median lobe noted -DRE: 40 grams, right nodule   Procedure: - Prostate block performed using 10 cc 1% lidocaine and biopsies taken from sextant areas, a total of 12 under ultrasound guidance.  Post-Procedure: - Patient tolerated the procedure well - He was counseled to seek immediate medical attention if experiences any severe pain, significant bleeding, or fevers - Return in one week to discuss biopsy results

## 2019-10-16 LAB — ANATOMIC PATHOLOGY REPORT: PDF Image: 0

## 2019-10-17 ENCOUNTER — Telehealth: Payer: Self-pay | Admitting: *Deleted

## 2019-10-17 NOTE — Telephone Encounter (Addendum)
Patient notified, would like to keep follow up as scheduled on 4/16. Voiced understanding.   ----- Message from Festus Aloe, MD sent at 10/17/2019  9:00 AM EDT ----- Adria Devon - please let Clair Gulling know his biopsy was BENIGN. No cancer found, which is great. I can see him back 4/16 as planned to discuss or I can see him back in 6 months with a PSA prior. Whatever he wants to do. Thanks!  ----- Message ----- From: Shanon Ace, CMA Sent: 10/17/2019   8:28 AM EDT To: Festus Aloe, MD   ----- Message ----- From: Lavone Neri Lab Results In Sent: 10/16/2019   4:37 PM EDT To: Rowe Robert [facility]

## 2019-11-07 ENCOUNTER — Encounter: Payer: Self-pay | Admitting: Urology

## 2019-11-07 ENCOUNTER — Ambulatory Visit (INDEPENDENT_AMBULATORY_CARE_PROVIDER_SITE_OTHER): Payer: 59 | Admitting: Urology

## 2019-11-07 ENCOUNTER — Other Ambulatory Visit: Payer: Self-pay

## 2019-11-07 VITALS — BP 154/89 | HR 106 | Ht 72.0 in | Wt 222.0 lb

## 2019-11-07 DIAGNOSIS — N138 Other obstructive and reflux uropathy: Secondary | ICD-10-CM

## 2019-11-07 DIAGNOSIS — R972 Elevated prostate specific antigen [PSA]: Secondary | ICD-10-CM | POA: Diagnosis not present

## 2019-11-07 DIAGNOSIS — N401 Enlarged prostate with lower urinary tract symptoms: Secondary | ICD-10-CM

## 2019-11-07 DIAGNOSIS — R3912 Poor urinary stream: Secondary | ICD-10-CM

## 2019-11-07 NOTE — Progress Notes (Signed)
11/07/2019 9:12 AM   Yancey Flemings [birth year] [id number]  Referring provider: Venia Carbon, MD [address] [place],  [place] [address]  Chief Complaint  Patient presents with  . Results    HPI:  F/u PSA elevation and BPH -   1) He has a rising PSA and one negative bx Mar 2021. No FH of Prostate Cancer.  PSA history: 10/17 4.8 02/19 4.3 07/20 5.3 01/21 6.7 03/21 Biopsy benign, prostate 77 g, right nodule one exam, PSA down to 5.8  2) BPH - prostate 77 g on [place] Mar 2021. He has frequency with lasix. He has a weak stream. He has DM and needed right foot distal amputation. His libido is good. On surveillance.   He returns to review bx results and discuss BPH/LUTS management. He's had some blood in the urine.    PMH: Past Medical History:  Diagnosis Date  . Adenomatous polyp   . Allergy   . Arthritis    feet   . Bunion 04/02/2013   STATUS POST OP BUN REPAIR  . Diabetes mellitus   . GERD (gastroesophageal reflux disease)   . Gout   . Hammertoe   . Hyperlipidemia   . Hypertension   . Neuromuscular disorder (HCC)    neuropathy  . Plantar flexed metatarsal   . Psoriasis     Surgical History: Past Surgical History:  Procedure Laterality Date  . COLONOSCOPY    . FOOT AMPUTATION Right 01/11/2017   Dr Doreatha Lew ([facility])--transmetatarsal  . FOOT SURGERY Right 02/28/2013   Shaune Leeks HAM TOE2,3 PINS ,2ND MET OSTEOTOMY, 5TH MET W/SCREW   02-2013, 04-2013  . [place]   surgery x2  . POLYPECTOMY    . PROSTATE BIOPSY  10/10/2019  . TOTAL HIP ARTHROPLASTY     Left hip replacement/femur fx 07/04, Screw removal left hip- [facility] 11/01    Home Medications:  Allergies as of 11/07/2019      Reactions   Glipizide Other (See Comments)   REACTION: wt. gain, hands tingling   Lisinopril Other (See Comments)   HYPERKALEMIA   Ramipril Cough   Ibuprofen    UNSPECIFIED REACTION       Medication List       Accurate as of November 07, 2019  9:12 AM. If  you have any questions, ask your nurse or doctor.        amLODipine 10 MG tablet Commonly known as: NORVASC TAKE 1 TABLET BY MOUTH  DAILY   aspirin 81 MG tablet Take 81 mg by mouth daily.   B-D SINGLE USE SWABS REGULAR Pads USE TWO TIMES DAILY   B-D UF III MINI PEN NEEDLES 31G X 5 MM Misc Generic drug: Insulin Pen Needle USE AS DIRECTED DAILY   calcium gluconate 500 MG tablet Take 500 mg by mouth daily.   Cinnamon 500 MG capsule Take 500 mg by mouth 2 (two) times daily.   Contour Next Test test strip Generic drug: glucose blood Use to check blood sugar   furosemide 20 MG tablet Commonly known as: LASIX Take 1 tablet (20 mg total) by mouth daily as needed.   hydrocortisone valerate ointment 0.2 % Commonly known as: WEST-CORT APPLY TO AFFECTED AREA(S)  TOPICALLY TWICE DAILY   indomethacin 25 MG capsule Commonly known as: INDOCIN TAKE 1 CAPSULE BY MOUTH 3   TIMES A DAY AS NEEDED   Lantus SoloStar 100 UNIT/ML Solostar Pen Generic drug: insulin glargine INJECT SUBCUTANEOUSLY 18  UNITS  DAILY   levofloxacin 500 MG tablet Commonly known as: LEVAQUIN Take 1 tablet (500 mg total) by mouth daily.   metFORMIN 1000 MG tablet Commonly known as: GLUCOPHAGE TAKE 1 TABLET BY MOUTH  TWICE DAILY WITH MEALS   omeprazole 20 MG capsule Commonly known as: PRILOSEC TAKE 1 CAPSULE BY MOUTH  TWICE DAILY BEFORE MEALS   simvastatin 40 MG tablet Commonly known as: ZOCOR TAKE 1 TABLET BY MOUTH AT  BEDTIME   triamcinolone lotion 0.1 % Commonly known as: KENALOG APPLY TOPICALLY 3 TIMES  DAILY       Allergies:  Allergies  Allergen Reactions  . Glipizide Other (See Comments)    REACTION: wt. gain, hands tingling  . Lisinopril Other (See Comments)    HYPERKALEMIA  . Ramipril Cough  . Ibuprofen     UNSPECIFIED REACTION     Family History: Family History  Problem Relation Age of Onset  . Diabetes Mother   . Cancer Neg Hx   . Colon cancer Neg Hx   . Colon polyps Neg Hx    . Rectal cancer Neg Hx   . Stomach cancer Neg Hx     Social History:  reports that he has never smoked. He has never used smokeless tobacco. He reports current alcohol use. He reports that he does not use drugs.   Physical Exam: BP (!) 154/89   Pulse (!) 106   Ht 6' (1.829 m)   Wt 222 lb (100.7 kg)   BMI 30.11 kg/m   Constitutional:  Alert and oriented, No acute distress. HEENT: [place] AT, moist mucus membranes.  Trachea midline, no masses. Respiratory: Normal respiratory effort, no increased work of breathing. GI: Abdomen is soft, nontender, nondistended, no abdominal masses GU: No CVA tenderness Skin: No rashes, bruises or suspicious lesions. Neurologic: Grossly intact, no focal deficits, moving all 4 extremities. Psychiatric: Normal mood and affect.  Laboratory Data: Lab Results  Component Value Date   WBC 5.3 02/14/2019   HGB 13.9 02/14/2019   HCT 41.4 02/14/2019   MCV 92 02/14/2019   PLT 247 02/14/2019    Lab Results  Component Value Date   CREATININE 1.11 02/14/2019    Lab Results  Component Value Date   PSA 3.3 08/13/2013    No results found for: TESTOSTERONE  Lab Results  Component Value Date   HGBA1C 7.3 (H) 08/19/2019    Urinalysis No results found for: COLORURINE, APPEARANCEUR, LABSPEC, PHURINE, GLUCOSEU, HGBUR, BILIRUBINUR, KETONESUR, PROTEINUR, UROBILINOGEN, NITRITE, LEUKOCYTESUR  Lab Results  Component Value Date   LABMICR 13.2 02/14/2019    Pertinent Imaging: n/a No results found for this or any previous visit. No results found for this or any previous visit. No results found for this or any previous visit. No results found for this or any previous visit. No results found for this or any previous visit. No results found for this or any previous visit. No results found for this or any previous visit. No results found for this or any previous visit.  Assessment & Plan:    PSA elevation - discussed low PSAD and nl bx. Will check PSA in  6 mo . His wife had some good questions about PET scans and MRI scans and those were answered. Will consider MRI in future.   BPH - discussed nature r/b/a to meds, procedures and surveillance.   No follow-ups on file.  Festus Aloe, MD  [facility] [address], [place], [place] [address] [phone]

## 2019-11-07 NOTE — Patient Instructions (Signed)

## 2020-02-03 ENCOUNTER — Other Ambulatory Visit: Payer: Self-pay | Admitting: Internal Medicine

## 2020-02-20 ENCOUNTER — Other Ambulatory Visit: Payer: Self-pay

## 2020-02-20 ENCOUNTER — Ambulatory Visit (INDEPENDENT_AMBULATORY_CARE_PROVIDER_SITE_OTHER): Payer: 59 | Admitting: Internal Medicine

## 2020-02-20 ENCOUNTER — Encounter: Payer: Self-pay | Admitting: Internal Medicine

## 2020-02-20 VITALS — BP 124/72 | HR 84 | Temp 98.2°F | Ht 72.0 in | Wt 218.0 lb

## 2020-02-20 DIAGNOSIS — E1140 Type 2 diabetes mellitus with diabetic neuropathy, unspecified: Secondary | ICD-10-CM | POA: Diagnosis not present

## 2020-02-20 DIAGNOSIS — S98911S Complete traumatic amputation of right foot, level unspecified, sequela: Secondary | ICD-10-CM

## 2020-02-20 DIAGNOSIS — Z0000 Encounter for general adult medical examination without abnormal findings: Secondary | ICD-10-CM | POA: Diagnosis not present

## 2020-02-20 DIAGNOSIS — I10 Essential (primary) hypertension: Secondary | ICD-10-CM

## 2020-02-20 LAB — HM DIABETES FOOT EXAM

## 2020-02-20 NOTE — Progress Notes (Signed)
Subjective:    Patient ID: [name], male    DOB: May 18, 1956, 64 y.o.   MRN: [id number]  HPI Here for physical This visit occurred during the SARS-CoV-2 public health emergency.  Safety protocols were in place, including screening questions prior to the visit, additional usage of staff PPE, and extensive cleaning of exam room while observing appropriate contact time as indicated for disinfecting solutions.   Doing okay Checks sugars twice a week--- usually under 130 in AM No hypoglycemic reactions Decreased sensation in feet---mostly the right one that had the amputations Not exercising---has been active due to recent move (downsized)  Did have negative prostate biopsy This was reassuring  Uses furosemide prn only  Current Outpatient Medications on File Prior to Visit  Medication Sig Dispense Refill  . Alcohol Swabs (B-D SINGLE USE SWABS REGULAR) PADS USE TWO TIMES DAILY 200 each 1  . amLODipine (NORVASC) 10 MG tablet TAKE 1 TABLET BY MOUTH  DAILY 90 tablet 3  . aspirin 81 MG tablet Take 81 mg by mouth daily.      Marland Kitchen CALCIUM-MAGNESIUM-ZINC PO Take by mouth.    . Cinnamon 500 MG capsule Take 500 mg by mouth 2 (two) times daily.      . furosemide (LASIX) 20 MG tablet Take 1 tablet (20 mg total) by mouth daily as needed. 90 tablet 3  . glucose blood (CONTOUR NEXT TEST) test strip Use to check blood sugar 100 each 4  . hydrocortisone valerate ointment (WEST-CORT) 0.2 % APPLY TO AFFECTED AREA(S)  TOPICALLY TWICE DAILY 120 g 0  . indomethacin (INDOCIN) 25 MG capsule TAKE 1 CAPSULE BY MOUTH 3   TIMES A DAY AS NEEDED 270 capsule 3  . Insulin Pen Needle (B-D UF III MINI PEN NEEDLES) 31G X 5 MM MISC USE AS DIRECTED DAILY 100 each 3  . LANTUS SOLOSTAR 100 UNIT/ML Solostar Pen INJECT SUBCUTANEOUSLY 18  UNITS DAILY 30 mL 3  . metFORMIN (GLUCOPHAGE) 1000 MG tablet TAKE 1 TABLET BY MOUTH  TWICE DAILY WITH MEALS 180 tablet 3  . omeprazole (PRILOSEC) 20 MG capsule TAKE 1 CAPSULE BY MOUTH  TWICE  DAILY BEFORE MEALS 180 capsule 3  . simvastatin (ZOCOR) 40 MG tablet TAKE 1 TABLET BY MOUTH AT  BEDTIME 90 tablet 3  . triamcinolone lotion (KENALOG) 0.1 % APPLY TOPICALLY 3 TIMES  DAILY 180 mL 0   No current facility-administered medications on file prior to visit.    Allergies  Allergen Reactions  . Glipizide Other (See Comments)    REACTION: wt. gain, hands tingling  . Lisinopril Other (See Comments)    HYPERKALEMIA  . Ramipril Cough  . Ibuprofen     UNSPECIFIED REACTION     Past Medical History:  Diagnosis Date  . Adenomatous polyp   . Allergy   . Arthritis    feet   . Bunion 04/02/2013   STATUS POST OP BUN REPAIR  . Diabetes mellitus   . GERD (gastroesophageal reflux disease)   . Gout   . Hammertoe   . Hyperlipidemia   . Hypertension   . Neuromuscular disorder (HCC)    neuropathy  . Plantar flexed metatarsal   . Psoriasis     Past Surgical History:  Procedure Laterality Date  . COLONOSCOPY    . FOOT AMPUTATION Right 01/11/2017   Dr Doreatha Lew ([facility])--transmetatarsal  . FOOT SURGERY Right 02/28/2013   Shaune Leeks HAM TOE2,3 PINS ,2ND MET OSTEOTOMY, 5TH MET W/SCREW   02-2013, 04-2013  . HIP FRACTURE  SURGERY  1993   surgery x2  . POLYPECTOMY    . PROSTATE BIOPSY  10/10/2019  . TOTAL HIP ARTHROPLASTY     Left hip replacement/femur fx 07/04, Screw removal left hip- [facility] 11/01    Family History  Problem Relation Age of Onset  . Diabetes Mother   . Cancer Neg Hx   . Colon cancer Neg Hx   . Colon polyps Neg Hx   . Rectal cancer Neg Hx   . Stomach cancer Neg Hx     Social History   Socioeconomic History  . Marital status: Married    Spouse name: Not on file  . Number of children: 3  . Years of education: Not on file  . Highest education level: Not on file  Occupational History  . Occupation: PC ADMINISTRATOR    Employer: [employer]  Tobacco Use  . Smoking status: Never Smoker  . Smokeless tobacco: Never Used  Substance and Sexual Activity  . Alcohol use:  Yes    Comment: occasional (DWI 1991)  . Drug use: No  . Sexual activity: Yes    Birth control/protection: None  Other Topics Concern  . Not on file  Social History Narrative       Social Determinants of Health   Financial Resource Strain:   . Difficulty of Paying Living Expenses:   Food Insecurity:   . Worried About Charity fundraiser in the Last Year:   . Arboriculturist in the Last Year:   Transportation Needs:   . Film/video editor (Medical):   Marland Kitchen Lack of Transportation (Non-Medical):   Physical Activity:   . Days of Exercise per Week:   . Minutes of Exercise per Session:   Stress:   . Feeling of Stress :   Social Connections:   . Frequency of Communication with Friends and Family:   . Frequency of Social Gatherings with Friends and Family:   . Attends Religious Services:   . Active Member of Clubs or Organizations:   . Attends Archivist Meetings:   Marland Kitchen Marital Status:   Intimate Partner Violence:   . Fear of Current or Ex-Partner:   . Emotionally Abused:   Marland Kitchen Physically Abused:   . Sexually Abused:    Review of Systems  Constitutional: Negative for fatigue.       Weight down slightly Wears seat belt  HENT: Positive for tinnitus. Negative for hearing loss and trouble swallowing.        2 more teeth pulled (broken) Keeps up with dentist  Eyes: Negative for visual disturbance.       Keeps up with eye doctor  Respiratory: Negative for cough, chest tightness and shortness of breath.   Cardiovascular: Positive for leg swelling. Negative for chest pain and palpitations.  Gastrointestinal: Negative for abdominal pain, blood in stool and constipation.       No heartburn on the medication  Endocrine: Negative for polydipsia and polyuria.  Genitourinary:       Mild dribbling  No sexual problems  Musculoskeletal: Negative for arthralgias, back pain and joint swelling.       Typical aches and pains  Skin:       Psoriasis better  Allergic/Immunologic:  Positive for environmental allergies. Negative for immunocompromised state.       Allergies quiet this year  Neurological: Negative for dizziness, syncope, light-headedness and headaches.  Hematological: Negative for adenopathy. Bruises/bleeds easily.  Psychiatric/Behavioral: Negative for dysphoric mood and sleep disturbance. The patient  is not nervous/anxious.        Objective:   Physical Exam Constitutional:      Appearance: Normal appearance.  HENT:     Head: Normocephalic and atraumatic.     Right Ear: Tympanic membrane and ear canal normal.     Left Ear: Tympanic membrane and ear canal normal.     Mouth/Throat:     Comments: No oral lesions Eyes:     Conjunctiva/sclera: Conjunctivae normal.     Pupils: Pupils are equal, round, and reactive to light.  Cardiovascular:     Rate and Rhythm: Normal rate and regular rhythm.     Pulses: Normal pulses.     Heart sounds: No murmur heard.  No gallop.   Pulmonary:     Effort: Pulmonary effort is normal.     Breath sounds: Normal breath sounds. No wheezing or rales.  Abdominal:     Palpations: Abdomen is soft.     Tenderness: There is no abdominal tenderness.  Musculoskeletal:     Cervical back: Neck supple.     Right lower leg: No edema.     Left lower leg: No edema.     Comments: Transmetatarsal amputation right foot is well healed  Lymphadenopathy:     Cervical: No cervical adenopathy.  Skin:    Findings: No rash.     Comments: No foot ulcers  Neurological:     Mental Status: He is alert and oriented to person, place, and time.     Comments: Mild decreased sensation in feet  R>L  Psychiatric:        Mood and Affect: Mood normal.        Behavior: Behavior normal.            Assessment & Plan:

## 2020-02-20 NOTE — Assessment & Plan Note (Signed)
Well healed No issues

## 2020-02-20 NOTE — Assessment & Plan Note (Signed)
Sounds like acceptable control Will check labs No Rx needed for mild neuropathy

## 2020-02-20 NOTE — Assessment & Plan Note (Signed)
BP Readings from Last 3 Encounters:  02/20/20 124/72  11/07/19 (!) 154/89  10/10/19 (!) 163/81   Good control Check labs

## 2020-02-20 NOTE — Assessment & Plan Note (Signed)
Healthy Discussed fitness Flu vaccine in the fall Pneumovax booster next year Defer PSAs now after negative biopsy Colon due 2023

## 2020-02-21 LAB — COMPREHENSIVE METABOLIC PANEL
ALT: 20 IU/L (ref 0–44)
AST: 26 IU/L (ref 0–40)
Albumin/Globulin Ratio: 1.7 (ref 1.2–2.2)
Albumin: 4.5 g/dL (ref 3.8–4.8)
Alkaline Phosphatase: 76 IU/L (ref 48–121)
BUN/Creatinine Ratio: 13 (ref 10–24)
BUN: 14 mg/dL (ref 8–27)
Bilirubin Total: 0.5 mg/dL (ref 0.0–1.2)
CO2: 26 mmol/L (ref 20–29)
Calcium: 9.4 mg/dL (ref 8.6–10.2)
Chloride: 98 mmol/L (ref 96–106)
Creatinine, Ser: 1.04 mg/dL (ref 0.76–1.27)
GFR calc Af Amer: 87 mL/min/{1.73_m2} (ref >59)
GFR calc non Af Amer: 76 mL/min/{1.73_m2} (ref >59)
Globulin, Total: 2.6 g/dL (ref 1.5–4.5)
Glucose: 107 mg/dL — ABNORMAL HIGH (ref 65–99)
Potassium: 4.8 mmol/L (ref 3.5–5.2)
Sodium: 140 mmol/L (ref 134–144)
Total Protein: 7.1 g/dL (ref 6.0–8.5)

## 2020-02-21 LAB — CBC
Hematocrit: 41.7 % (ref 37.5–51.0)
Hemoglobin: 14 g/dL (ref 13.0–17.7)
MCH: 30.8 pg (ref 26.6–33.0)
MCHC: 33.6 g/dL (ref 31.5–35.7)
MCV: 92 fL (ref 79–97)
Platelets: 281 10*3/uL (ref 150–450)
RBC: 4.54 x10E6/uL (ref 4.14–5.80)
RDW: 13.1 % (ref 11.6–15.4)
WBC: 7 10*3/uL (ref 3.4–10.8)

## 2020-02-21 LAB — LIPID PANEL
Chol/HDL Ratio: 2.2 ratio (ref 0.0–5.0)
Cholesterol, Total: 164 mg/dL (ref 100–199)
HDL: 75 mg/dL (ref >39)
LDL Chol Calc (NIH): 70 mg/dL (ref 0–99)
Triglycerides: 105 mg/dL (ref 0–149)
VLDL Cholesterol Cal: 19 mg/dL (ref 5–40)

## 2020-02-21 LAB — MICROALBUMIN / CREATININE URINE RATIO
Creatinine, Urine: 64.9 mg/dL
Microalb/Creat Ratio: 32 mg/g creat — ABNORMAL HIGH (ref 0–29)
Microalbumin, Urine: 20.8 ug/mL

## 2020-02-21 LAB — HEMOGLOBIN A1C
Est. average glucose Bld gHb Est-mCnc: 180 mg/dL
Hgb A1c MFr Bld: 7.9 % — ABNORMAL HIGH (ref 4.8–5.6)

## 2020-02-25 ENCOUNTER — Other Ambulatory Visit: Payer: Self-pay

## 2020-02-25 MED ORDER — LOSARTAN POTASSIUM 25 MG PO TABS: 25.0000 mg | ORAL_TABLET | Freq: Every day | ORAL | 3 refills | Status: DC

## 2020-02-25 MED ORDER — AMLODIPINE BESYLATE 5 MG PO TABS
5.0000 mg | ORAL_TABLET | Freq: Every day | ORAL | 3 refills | Status: DC
Start: 2020-02-25 — End: 2021-05-09

## 2020-03-08 ENCOUNTER — Other Ambulatory Visit: Payer: Self-pay | Admitting: Internal Medicine

## 2020-03-08 DIAGNOSIS — I10 Essential (primary) hypertension: Secondary | ICD-10-CM

## 2020-03-19 ENCOUNTER — Other Ambulatory Visit: Payer: Self-pay | Admitting: Internal Medicine

## 2020-03-25 ENCOUNTER — Other Ambulatory Visit (INDEPENDENT_AMBULATORY_CARE_PROVIDER_SITE_OTHER): Payer: 59

## 2020-03-25 ENCOUNTER — Other Ambulatory Visit: Payer: Self-pay

## 2020-03-25 DIAGNOSIS — I10 Essential (primary) hypertension: Secondary | ICD-10-CM | POA: Diagnosis not present

## 2020-03-25 NOTE — Addendum Note (Signed)
Addended by: Ellamae Sia on: 03/25/2020 07:53 AM   Modules accepted: Orders

## 2020-03-26 LAB — RENAL FUNCTION PANEL
Albumin: 4.7 g/dL (ref 3.8–4.8)
BUN/Creatinine Ratio: 9 — ABNORMAL LOW (ref 10–24)
BUN: 9 mg/dL (ref 8–27)
CO2: 23 mmol/L (ref 20–29)
Calcium: 9.4 mg/dL (ref 8.6–10.2)
Chloride: 100 mmol/L (ref 96–106)
Creatinine, Ser: 0.97 mg/dL (ref 0.76–1.27)
GFR calc Af Amer: 95 mL/min/{1.73_m2} (ref >59)
GFR calc non Af Amer: 82 mL/min/{1.73_m2} (ref >59)
Glucose: 115 mg/dL — ABNORMAL HIGH (ref 65–99)
Phosphorus: 3.3 mg/dL (ref 2.8–4.1)
Potassium: 4.9 mmol/L (ref 3.5–5.2)
Sodium: 137 mmol/L (ref 134–144)

## 2020-05-05 ENCOUNTER — Other Ambulatory Visit: Payer: Self-pay

## 2020-05-07 ENCOUNTER — Encounter: Payer: Self-pay | Admitting: Urology

## 2020-05-07 ENCOUNTER — Ambulatory Visit: Payer: 59 | Admitting: Urology

## 2020-06-18 ENCOUNTER — Other Ambulatory Visit: Payer: Self-pay | Admitting: Internal Medicine

## 2020-08-10 LAB — HM DIABETES EYE EXAM

## 2020-08-15 ENCOUNTER — Other Ambulatory Visit: Payer: Self-pay | Admitting: Internal Medicine

## 2020-08-27 ENCOUNTER — Ambulatory Visit: Payer: 59 | Admitting: Internal Medicine

## 2020-08-27 ENCOUNTER — Encounter: Payer: Self-pay | Admitting: Internal Medicine

## 2020-08-27 ENCOUNTER — Other Ambulatory Visit: Payer: Self-pay

## 2020-08-27 VITALS — BP 136/84 | HR 88 | Temp 97.8°F | Ht 72.0 in | Wt 220.0 lb

## 2020-08-27 DIAGNOSIS — Z794 Long term (current) use of insulin: Secondary | ICD-10-CM

## 2020-08-27 DIAGNOSIS — Z23 Encounter for immunization: Secondary | ICD-10-CM

## 2020-08-27 DIAGNOSIS — E1140 Type 2 diabetes mellitus with diabetic neuropathy, unspecified: Secondary | ICD-10-CM | POA: Diagnosis not present

## 2020-08-27 DIAGNOSIS — Z89431 Acquired absence of right foot: Secondary | ICD-10-CM | POA: Diagnosis not present

## 2020-08-27 DIAGNOSIS — I10 Essential (primary) hypertension: Secondary | ICD-10-CM | POA: Diagnosis not present

## 2020-08-27 LAB — POCT GLYCOSYLATED HEMOGLOBIN (HGB A1C): Hemoglobin A1C: 9.6 % — AB (ref 4.0–5.6)

## 2020-08-27 MED ORDER — LANTUS SOLOSTAR 100 UNIT/ML ~~LOC~~ SOPN: 25.0000 [IU] | PEN_INJECTOR | Freq: Every day | SUBCUTANEOUS | 0 refills | Status: DC

## 2020-08-27 MED ORDER — DAPAGLIFLOZIN PROPANEDIOL 5 MG PO TABS: 5.0000 mg | ORAL_TABLET | Freq: Every day | ORAL | 3 refills | Status: DC

## 2020-08-27 NOTE — Addendum Note (Signed)
Addended by: Pilar Grammes on: 08/27/2020 12:44 PM   Modules accepted: Orders

## 2020-08-27 NOTE — Progress Notes (Signed)
Subjective:    Patient ID: [name], male    DOB: 12/01/1955, 65 y.o.   MRN: [id number]  HPI Here for follow up of diabetes and HTN (and early nephropathy) This visit occurred during the SARS-CoV-2 public health emergency.  Safety protocols were in place, including screening questions prior to the visit, additional usage of staff PPE, and extensive cleaning of exam room while observing appropriate contact time as indicated for disinfecting solutions.   Having work stress They fired his boss----he is having to carry an extra load Still trying to eat right but no exercise  No problems with the losartan No dizziness or syncope No chest pain or SOB Weather is affecting his sinuses---he does use Neti pot  Checks sugars 1-2 times a week Running high lately---higher than normal No new foot problems---no ulcers now Ongoing sensory changes  Current Outpatient Medications on File Prior to Visit  Medication Sig Dispense Refill  . Alcohol Swabs (B-D SINGLE USE SWABS REGULAR) PADS USE TWICE DAILY 200 each 3  . amLODipine (NORVASC) 5 MG tablet Take 1 tablet (5 mg total) by mouth daily. 90 tablet 3  . aspirin 81 MG tablet Take 81 mg by mouth daily.    . B-D UF III MINI PEN NEEDLES 31G X 5 MM MISC USE AS DIRECTED DAILY 90 each 3  . CALCIUM-MAGNESIUM-ZINC PO Take by mouth.    . Cinnamon 500 MG capsule Take 500 mg by mouth 2 (two) times daily.    . furosemide (LASIX) 20 MG tablet Take 1 tablet (20 mg total) by mouth daily as needed. 90 tablet 3  . glucose blood (CONTOUR NEXT TEST) test strip Use to check blood sugar 100 each 4  . hydrocortisone valerate ointment (WEST-CORT) 0.2 % APPLY TO AFFECTED AREA(S)  TOPICALLY TWICE DAILY 120 g 0  . indomethacin (INDOCIN) 25 MG capsule TAKE 1 CAPSULE BY MOUTH 3   TIMES A DAY AS NEEDED 270 capsule 3  . LANTUS SOLOSTAR 100 UNIT/ML Solostar Pen INJECT SUBCUTANEOUSLY 18  UNITS DAILY 30 mL 3  . losartan (COZAAR) 25 MG tablet Take 1 tablet (25 mg total) by  mouth daily. 90 tablet 3  . metFORMIN (GLUCOPHAGE) 1000 MG tablet TAKE 1 TABLET BY MOUTH  TWICE DAILY WITH MEALS 180 tablet 3  . omeprazole (PRILOSEC) 20 MG capsule TAKE 1 CAPSULE BY MOUTH  TWICE DAILY BEFORE MEALS 180 capsule 3  . simvastatin (ZOCOR) 40 MG tablet TAKE 1 TABLET BY MOUTH AT  BEDTIME 90 tablet 3  . triamcinolone lotion (KENALOG) 0.1 % APPLY TOPICALLY 3 TIMES  DAILY 180 mL 0   No current facility-administered medications on file prior to visit.    Allergies  Allergen Reactions  . Glipizide Other (See Comments)    REACTION: wt. gain, hands tingling  . Lisinopril Other (See Comments)    HYPERKALEMIA  . Ramipril Cough  . Ibuprofen     UNSPECIFIED REACTION     Past Medical History:  Diagnosis Date  . Adenomatous polyp   . Allergy   . Arthritis    feet   . Bunion 04/02/2013   STATUS POST OP BUN REPAIR  . Diabetes mellitus   . GERD (gastroesophageal reflux disease)   . Gout   . Hammertoe   . Hyperlipidemia   . Hypertension   . Neuromuscular disorder (HCC)    neuropathy  . Plantar flexed metatarsal   . Psoriasis     Past Surgical History:  Procedure Laterality Date  . COLONOSCOPY    .  FOOT AMPUTATION Right 01/11/2017   Dr Doreatha Lew ([facility])--transmetatarsal  . FOOT SURGERY Right 02/28/2013   Shaune Leeks HAM TOE2,3 PINS ,2ND MET OSTEOTOMY, 5TH MET W/SCREW   02-2013, 04-2013  . [place]   surgery x2  . POLYPECTOMY    . PROSTATE BIOPSY  10/10/2019  . TOTAL HIP ARTHROPLASTY     Left hip replacement/femur fx 07/04, Screw removal left hip- [facility] 11/01    Family History  Problem Relation Age of Onset  . Diabetes Mother   . Diabetes Brother   . Cancer Neg Hx   . Colon cancer Neg Hx   . Colon polyps Neg Hx   . Rectal cancer Neg Hx   . Stomach cancer Neg Hx     Social History   Socioeconomic History  . Marital status: Married    Spouse name: Not on file  . Number of children: 3  . Years of education: Not on file  . Highest education level:  Not on file  Occupational History  . Occupation: PC ADMINISTRATOR    Employer: [employer]  Tobacco Use  . Smoking status: Never Smoker  . Smokeless tobacco: Never Used  Substance and Sexual Activity  . Alcohol use: Yes    Comment: occasional (DWI 1991)  . Drug use: No  . Sexual activity: Yes    Birth control/protection: None  Other Topics Concern  . Not on file  Social History Narrative       Social Determinants of Health   Financial Resource Strain: Not on file  Food Insecurity: Not on file  Transportation Needs: Not on file  Physical Activity: Not on file  Stress: Not on file  Social Connections: Not on file  Intimate Partner Violence: Not on file   Review of Systems  Appetite is okay Usually sleeps okay     Objective:   Physical Exam Constitutional:      Appearance: Normal appearance.  Cardiovascular:     Rate and Rhythm: Normal rate and regular rhythm.     Pulses: Normal pulses.     Heart sounds: No murmur heard. No gallop.   Pulmonary:     Effort: Pulmonary effort is normal.     Breath sounds: Normal breath sounds. No wheezing or rales.  Skin:    Comments: Stable callous under right transmetatarsal amputation No ulcers  Neurological:     Mental Status: He is alert.            Assessment & Plan:

## 2020-08-27 NOTE — Assessment & Plan Note (Addendum)
Control not as good Discussed tightening up with other options---he is more interested in SGLT2 inhibitor Will recheck A1c today on lantus and metformin  Lab Results  Component Value Date   HGBA1C 9.6 (A) 08/27/2020   Whow!!! Much worse He will work on lifestyle Add farxiga Increase lantus to 25 for now

## 2020-08-27 NOTE — Assessment & Plan Note (Signed)
BP Readings from Last 3 Encounters:  08/27/20 136/84  02/20/20 124/72  11/07/19 (!) 154/89   Acceptable  On amlodipine and now losartan (after early proteinuria)

## 2020-08-27 NOTE — Assessment & Plan Note (Addendum)
Stable appearance Chronic mild callous---he monitors and keeps controlled Stable neuropathy

## 2020-10-22 ENCOUNTER — Other Ambulatory Visit: Payer: Self-pay | Admitting: Internal Medicine

## 2020-11-24 ENCOUNTER — Other Ambulatory Visit: Payer: Self-pay

## 2020-11-24 ENCOUNTER — Ambulatory Visit: Payer: 59 | Admitting: Internal Medicine

## 2020-11-24 ENCOUNTER — Encounter: Payer: Self-pay | Admitting: Internal Medicine

## 2020-11-24 VITALS — BP 136/76 | HR 114 | Temp 98.0°F | Ht 72.0 in | Wt 230.0 lb

## 2020-11-24 DIAGNOSIS — E1140 Type 2 diabetes mellitus with diabetic neuropathy, unspecified: Secondary | ICD-10-CM | POA: Diagnosis not present

## 2020-11-24 DIAGNOSIS — Z794 Long term (current) use of insulin: Secondary | ICD-10-CM

## 2020-11-24 DIAGNOSIS — J069 Acute upper respiratory infection, unspecified: Secondary | ICD-10-CM | POA: Diagnosis not present

## 2020-11-24 LAB — POCT GLYCOSYLATED HEMOGLOBIN (HGB A1C): Hemoglobin A1C: 6.7 % — AB (ref 4.0–5.6)

## 2020-11-24 NOTE — Assessment & Plan Note (Signed)
Lab Results  Component Value Date   HGBA1C 6.7 (A) 11/24/2020   He notes marked improvement in his lifestyle when clarifying ---"I have started paying attention" Will continue insulin (22) and metformin Will hold off on the farxiga

## 2020-11-24 NOTE — Assessment & Plan Note (Signed)
Mostly bronchial He and all coworkers all tested negative---likely other viral infection No real sinus symptoms Discussed supportive care Consider antibiotic if increasing sinus symptoms

## 2020-11-24 NOTE — Progress Notes (Signed)
Subjective:    Patient ID: [name], male    DOB: 06-01-1956, 65 y.o.   MRN: [id number]  HPI Here for follow up of poorly controlled diabetes and respiratory symptoms This visit occurred during the SARS-CoV-2 public health emergency.  Safety protocols were in place, including screening questions prior to the visit, additional usage of staff PPE, and extensive cleaning of exam room while observing appropriate contact time as indicated for disinfecting solutions.   Started with tickle in his throat about 5 days ago "Like a big glob of mucus in my throat" Several others at work with symptoms---all COVID negative No fever Slight head congestion--throat seems to be breaking up Some cough--trying to loosen mucus (dry) No SOB He did test negative for COVID on home test (and had booster)  Never got the new medication from OPtum "I just never thought about it" Hasn't really improved lifestyle Running up still ---but better than 3 months ago Did increase insulin to 22 units daily  Current Outpatient Medications on File Prior to Visit  Medication Sig Dispense Refill  . Alcohol Swabs (B-D SINGLE USE SWABS REGULAR) PADS USE TWICE DAILY 200 each 3  . amLODipine (NORVASC) 5 MG tablet Take 1 tablet (5 mg total) by mouth daily. 90 tablet 3  . aspirin 81 MG tablet Take 81 mg by mouth daily.    . B-D UF III MINI PEN NEEDLES 31G X 5 MM MISC USE AS DIRECTED DAILY 90 each 3  . CALCIUM-MAGNESIUM-ZINC PO Take by mouth.    . Cinnamon 500 MG capsule Take 500 mg by mouth 2 (two) times daily.    . furosemide (LASIX) 20 MG tablet Take 1 tablet (20 mg total) by mouth daily as needed. 90 tablet 3  . glucose blood (CONTOUR NEXT TEST) test strip Use to check blood sugar 100 each 4  . hydrocortisone valerate ointment (WEST-CORT) 0.2 % APPLY TO AFFECTED AREA(S)  TOPICALLY TWICE DAILY 120 g 0  . indomethacin (INDOCIN) 25 MG capsule TAKE 1 CAPSULE BY MOUTH 3   TIMES A DAY AS NEEDED 270 capsule 3  . insulin  glargine (LANTUS SOLOSTAR) 100 UNIT/ML Solostar Pen Inject 25 Units into the skin daily. (Patient taking differently: Inject 25 Units into the skin daily. 22 units per pt) 1 mL 0  . losartan (COZAAR) 25 MG tablet Take 1 tablet (25 mg total) by mouth daily. 90 tablet 3  . metFORMIN (GLUCOPHAGE) 1000 MG tablet TAKE 1 TABLET BY MOUTH  TWICE DAILY WITH MEALS 180 tablet 3  . omeprazole (PRILOSEC) 20 MG capsule TAKE 1 CAPSULE BY MOUTH  TWICE DAILY BEFORE MEALS 180 capsule 3  . simvastatin (ZOCOR) 40 MG tablet TAKE 1 TABLET BY MOUTH AT  BEDTIME 90 tablet 3  . triamcinolone lotion (KENALOG) 0.1 % APPLY TOPICALLY 3 TIMES  DAILY 180 mL 0  . dapagliflozin propanediol (FARXIGA) 5 MG TABS tablet Take 1 tablet (5 mg total) by mouth daily before breakfast. (Patient not taking: Reported on 11/24/2020) 90 tablet 3   No current facility-administered medications on file prior to visit.    Allergies  Allergen Reactions  . Glipizide Other (See Comments)    REACTION: wt. gain, hands tingling  . Lisinopril Other (See Comments)    HYPERKALEMIA  . Ramipril Cough  . Ibuprofen     UNSPECIFIED REACTION     Past Medical History:  Diagnosis Date  . Adenomatous polyp   . Allergy   . Arthritis    feet   .  Bunion 04/02/2013   STATUS POST OP BUN REPAIR  . Diabetes mellitus   . GERD (gastroesophageal reflux disease)   . Gout   . Hammertoe   . Hyperlipidemia   . Hypertension   . Neuromuscular disorder (HCC)    neuropathy  . Plantar flexed metatarsal   . Psoriasis     Past Surgical History:  Procedure Laterality Date  . COLONOSCOPY    . FOOT AMPUTATION Right 01/11/2017   Dr Doreatha Lew ([facility])--transmetatarsal  . FOOT SURGERY Right 02/28/2013   Shaune Leeks HAM TOE2,3 PINS ,2ND MET OSTEOTOMY, 5TH MET W/SCREW   02-2013, 04-2013  . [place]   surgery x2  . POLYPECTOMY    . PROSTATE BIOPSY  10/10/2019  . TOTAL HIP ARTHROPLASTY     Left hip replacement/femur fx 07/04, Screw removal left hip- [facility]  11/01    Family History  Problem Relation Age of Onset  . Diabetes Mother   . Diabetes Brother   . Cancer Neg Hx   . Colon cancer Neg Hx   . Colon polyps Neg Hx   . Rectal cancer Neg Hx   . Stomach cancer Neg Hx     Social History   Socioeconomic History  . Marital status: Married    Spouse name: Not on file  . Number of children: 3  . Years of education: Not on file  . Highest education level: Not on file  Occupational History  . Occupation: PC ADMINISTRATOR    Employer: [employer]  Tobacco Use  . Smoking status: Never Smoker  . Smokeless tobacco: Never Used  Substance and Sexual Activity  . Alcohol use: Yes    Comment: occasional (DWI 1991)  . Drug use: No  . Sexual activity: Yes    Birth control/protection: None  Other Topics Concern  . Not on file  Social History Narrative       Social Determinants of Health   Financial Resource Strain: Not on file  Food Insecurity: Not on file  Transportation Needs: Not on file  Physical Activity: Not on file  Stress: Not on file  Social Connections: Not on file  Intimate Partner Violence: Not on file   Review of Systems No N/V/diarrhea Some pollen symptoms---takes benedryl in AM (no sedation). Also has zyrtec    Objective:   Physical Exam Constitutional:      Appearance: Normal appearance.  HENT:     Head:     Comments: No sinus tenderness    Right Ear: Tympanic membrane, ear canal and external ear normal.     Left Ear: Tympanic membrane, ear canal and external ear normal.     Nose:     Comments: Moderate congestion--more on right    Mouth/Throat:     Pharynx: No oropharyngeal exudate or posterior oropharyngeal erythema.  Cardiovascular:     Rate and Rhythm: Normal rate and regular rhythm.     Heart sounds: No murmur heard. No gallop.   Pulmonary:     Effort: Pulmonary effort is normal.     Breath sounds: Normal breath sounds. No wheezing or rales.  Musculoskeletal:     Cervical back: Neck supple.      Comments: Well healed right transmetatarsal amputaion  Lymphadenopathy:     Cervical: No cervical adenopathy.  Skin:    Comments: Bad scaling on plantar left foot--no ulcer  Neurological:     Mental Status: He is alert.            Assessment & Plan:

## 2020-12-02 ENCOUNTER — Telehealth: Payer: Self-pay

## 2020-12-02 MED ORDER — AMOXICILLIN 500 MG PO TABS: 1000.0000 mg | ORAL_TABLET | Freq: Two times a day (BID) | ORAL | 0 refills | Status: AC

## 2020-12-02 NOTE — Telephone Encounter (Signed)
Pt's wife Ruby called to report pt is coughing more, more chest congestion and fatigue--- yellow/green sputum...denies fever chills or body aches... Pt is taking OTC Zyrtec, Sudafed, Tylenol, Dayquil with no relief  Send to [facility]

## 2020-12-02 NOTE — Addendum Note (Signed)
Addended by: Viviana Simpler I on: 12/02/2020 12:58 PM   Modules accepted: Orders

## 2020-12-02 NOTE — Telephone Encounter (Signed)
Please let them know I sent a prescription for an antibiotic for him to try

## 2020-12-02 NOTE — Telephone Encounter (Signed)
Left message that antibiotic sent in.

## 2021-01-15 ENCOUNTER — Other Ambulatory Visit: Payer: Self-pay | Admitting: Internal Medicine

## 2021-02-09 ENCOUNTER — Ambulatory Visit: Payer: 59 | Admitting: Family Medicine

## 2021-02-09 ENCOUNTER — Encounter: Payer: Self-pay | Admitting: Family Medicine

## 2021-02-09 ENCOUNTER — Other Ambulatory Visit: Payer: Self-pay

## 2021-02-09 VITALS — BP 144/76 | HR 99 | Temp 97.9°F | Ht 72.0 in | Wt 228.5 lb

## 2021-02-09 DIAGNOSIS — R238 Other skin changes: Secondary | ICD-10-CM

## 2021-02-09 DIAGNOSIS — E118 Type 2 diabetes mellitus with unspecified complications: Secondary | ICD-10-CM

## 2021-02-09 DIAGNOSIS — Z89431 Acquired absence of right foot: Secondary | ICD-10-CM | POA: Diagnosis not present

## 2021-02-09 DIAGNOSIS — L409 Psoriasis, unspecified: Secondary | ICD-10-CM

## 2021-02-09 NOTE — Progress Notes (Signed)
[name] T. Alease Fait, MD, [place] at [facility] [place], [address]  Phone: [phone]  FAX: [phone]  [name] - 65 y.o. male  MRN [id number]  Date of Birth: 1956-07-12  Date: 02/09/2021  PCP: Venia Carbon, MD  Referral: Venia Carbon, MD  Chief Complaint  Patient presents with  . Skin Cracking     Bilateral Feet-Patient is Diabetic  . Rash    This visit occurred during the SARS-CoV-2 public health emergency.  Safety protocols were in place, including screening questions prior to the visit, additional usage of staff PPE, and extensive cleaning of exam room while observing appropriate contact time as indicated for disinfecting solutions.   Subjective:   [name] is a 65 y.o. very pleasant male patient with Body mass index is 30.99 kg/m. who presents with the following:  The patient presents with severe cracking of the plantar aspect of his feet that began on Saturday and has been progressing.  He does have a significant history of psoriasis going about 40 or 50 years, and he also has some very significant diabetes.  He also has a history of right-sided toe amputation after complications from a foot surgery.  Ultimately amputation was done by [facility] foot and ankle surgery.  He had a bug bite on Saturday morning, and he attributes his feet changes to this.  Bad started sat morning - ? Something bit.  Has had a prior foot amputation.   Has been using a Okeefes product on his feet with limited success  Never looked this before.   He currently does not have any fever.  He is very compliant with his diabetic medications.  Lab Results  Component Value Date   HGBA1C 6.7 (A) 11/24/2020    Patient Active Problem List   Diagnosis Date Noted  . Elevated PSA 02/14/2019  . 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 ([place]) 03/12/2018  . Adenomatous polyp   . Viral upper  respiratory infection 09/07/2011  . Routine general medical examination at a health care facility 04/17/2011  . OSTEOARTHRITIS 03/17/2007  . DM (diabetes mellitus) type II controlled, neurological manifestation ([place]) 03/13/2007  . Hyperlipemia 03/13/2007  . GOUT 03/13/2007  . Essential hypertension, benign 03/13/2007  . GERD 03/13/2007  . PSORIASIS 03/13/2007    Past Medical History:  Diagnosis Date  . Adenomatous polyp   . Allergy   . Arthritis    feet   . Bunion 04/02/2013   STATUS POST OP BUN REPAIR  . Diabetes mellitus   . GERD (gastroesophageal reflux disease)   . Gout   . Hammertoe   . Hyperlipidemia   . Hypertension   . Neuromuscular disorder (HCC)    neuropathy  . Plantar flexed metatarsal   . Psoriasis     Past Surgical History:  Procedure Laterality Date  . COLONOSCOPY    . FOOT AMPUTATION Right 01/11/2017   Dr Doreatha Lew ([facility])--transmetatarsal  . FOOT SURGERY Right 02/28/2013   Shaune Leeks HAM TOE2,3 PINS ,2ND MET OSTEOTOMY, 5TH MET W/SCREW   02-2013, 04-2013  . [facility]   surgery x2  . POLYPECTOMY    . PROSTATE BIOPSY  10/10/2019  . TOTAL HIP ARTHROPLASTY     Left hip replacement/femur fx 07/04, Screw removal left hip- [facility] 11/01    Family History  Problem Relation Age of Onset  . Diabetes Mother   . Diabetes Brother   .  Cancer Neg Hx   . Colon cancer Neg Hx   . Colon polyps Neg Hx   . Rectal cancer Neg Hx   . Stomach cancer Neg Hx      Review of Systems is noted in the HPI, as appropriate  Objective:   BP (!) 144/76   Pulse 99   Temp 97.9 F (36.6 C) (Temporal)   Ht 6' (1.829 m)   Wt 228 lb 8 oz (103.6 kg)   SpO2 95%   BMI 30.99 kg/m   GEN: No acute distress; alert,appropriate. PULM: Breathing comfortably in no respiratory distress PSYCH: Normally interactive.          Laboratory and Imaging Data:  Assessment and Plan:     ICD-10-CM   1. Skin breakdown  R23.8 Ambulatory referral to Podiatry    2. Diabetic  foot ([place])  E11.8 Ambulatory referral to Podiatry    3. History of amputation of right foot ([place])  Z89.431 Ambulatory referral to Podiatry    4. Psoriasis  L40.9      Total encounter time: 30 minutes. This includes total time spent on the day of encounter.  Extensive bilateral foot cracking on the plantar aspect of his feet.  Long-term diabetic.  I am going to have the patient put some Aquaphor on his feet with some socks that are cotton to help with absorption for now.  Additional time was spent in disposition and I personally was on the phone with podiatry.  Additional time spent in consultation with one of my partners.  Thankfully I have been able to get the patient in to see Dr. Caryl Comes at [facility], and his appointment will be on Friday.   Orders Placed This Encounter  Procedures  . Ambulatory referral to Podiatry    Follow-up: Podiatry  Signed,  Maud Deed. Kelbie Moro, MD   Outpatient Encounter Medications as of 02/09/2021  Medication Sig  . Alcohol Swabs (B-D SINGLE USE SWABS REGULAR) PADS USE TWICE DAILY  . amLODipine (NORVASC) 5 MG tablet Take 1 tablet (5 mg total) by mouth daily.  Marland Kitchen aspirin 81 MG tablet Take 81 mg by mouth daily.  . B-D UF III MINI PEN NEEDLES 31G X 5 MM MISC USE AS DIRECTED DAILY  . CALCIUM-MAGNESIUM-ZINC PO Take by mouth.  . Cinnamon 500 MG capsule Take 500 mg by mouth 2 (two) times daily.  . furosemide (LASIX) 20 MG tablet Take 1 tablet (20 mg total) by mouth daily as needed.  Marland Kitchen glucose blood (CONTOUR NEXT TEST) test strip Use to check blood sugar  . hydrocortisone valerate ointment (WEST-CORT) 0.2 % APPLY TO AFFECTED AREA(S)  TOPICALLY TWICE DAILY  . indomethacin (INDOCIN) 25 MG capsule TAKE 1 CAPSULE BY MOUTH 3   TIMES A DAY AS NEEDED  . insulin glargine (LANTUS SOLOSTAR) 100 UNIT/ML Solostar Pen Inject 25 Units into the skin daily. (Patient taking differently: Inject 25 Units into the skin daily. 22 units per pt)  . losartan (COZAAR) 25 MG  tablet Take 1 tablet (25 mg total) by mouth daily.  . metFORMIN (GLUCOPHAGE) 1000 MG tablet TAKE 1 TABLET BY MOUTH  TWICE DAILY WITH MEALS  . omeprazole (PRILOSEC) 20 MG capsule TAKE 1 CAPSULE BY MOUTH  TWICE DAILY BEFORE MEALS  . simvastatin (ZOCOR) 40 MG tablet TAKE 1 TABLET BY MOUTH AT  BEDTIME  . triamcinolone lotion (KENALOG) 0.1 % APPLY TOPICALLY 3 TIMES  DAILY   No facility-administered encounter medications on file as of 02/09/2021.

## 2021-02-09 NOTE — Patient Instructions (Addendum)
Aquaphor or Vaseline  Apply to feet three times a day. Put a sock on feet.

## 2021-02-10 ENCOUNTER — Encounter: Payer: Self-pay | Admitting: Family Medicine

## 2021-02-11 ENCOUNTER — Other Ambulatory Visit: Payer: Self-pay | Admitting: Internal Medicine

## 2021-02-24 ENCOUNTER — Encounter: Payer: Self-pay | Admitting: Internal Medicine

## 2021-02-24 ENCOUNTER — Other Ambulatory Visit: Payer: Self-pay

## 2021-02-24 ENCOUNTER — Ambulatory Visit: Payer: 59 | Admitting: Internal Medicine

## 2021-02-24 DIAGNOSIS — R238 Other skin changes: Secondary | ICD-10-CM

## 2021-02-24 DIAGNOSIS — T7840XS Allergy, unspecified, sequela: Secondary | ICD-10-CM | POA: Diagnosis not present

## 2021-02-24 DIAGNOSIS — T7840XA Allergy, unspecified, initial encounter: Secondary | ICD-10-CM | POA: Insufficient documentation

## 2021-02-24 MED ORDER — PREDNISONE 20 MG PO TABS: 40.0000 mg | ORAL_TABLET | Freq: Every day | ORAL | 0 refills | Status: DC

## 2021-02-24 NOTE — Assessment & Plan Note (Signed)
Now 83 weeks old Never seemed to have an infection---so doubt it was staphylococcal scalded skin syndrome. Desquamation only on plantar feet  I suspect this was [place] syndrome or something similar No new meds Unclear if he did get a bite--but that could have caused the reaction  The hand lesions are new Will try trial of prednisone since this seems to be active still

## 2021-02-24 NOTE — Progress Notes (Signed)
Subjective:    Patient ID: [name], male    DOB: 28-Nov-1955, 65 y.o.   MRN: [id number]  HPI Here with wife due to rash This visit occurred during the SARS-CoV-2 public health emergency.  Safety protocols were in place, including screening questions prior to the visit, additional usage of staff PPE, and extensive cleaning of exam room while observing appropriate contact time as indicated for disinfecting solutions.   2 weeks ago, he got bit by something on chest and on left leg Not sure if it was inside or outside and he never saw an insect Next day--started with hives (widespread) Then bottom of feet started peeling and splitting open  Came in here --told to start aquaphor (this has helped some) Then saw Dr Marlowe Alt it was a systemic reaction He cleaned off some blisters and started mupirocin Now with some peeling in his hands  Current Outpatient Medications on File Prior to Visit  Medication Sig Dispense Refill   Alcohol Swabs (B-D SINGLE USE SWABS REGULAR) PADS USE TWICE DAILY 200 each 3   amLODipine (NORVASC) 5 MG tablet Take 1 tablet (5 mg total) by mouth daily. 90 tablet 3   aspirin 81 MG tablet Take 81 mg by mouth daily.     B-D UF III MINI PEN NEEDLES 31G X 5 MM MISC USE AS DIRECTED DAILY 90 each 3   CALCIUM-MAGNESIUM-ZINC PO Take by mouth.     Cinnamon 500 MG capsule Take 500 mg by mouth 2 (two) times daily.     furosemide (LASIX) 20 MG tablet Take 1 tablet (20 mg total) by mouth daily as needed. 90 tablet 3   glucose blood (CONTOUR NEXT TEST) test strip Use to check blood sugar 100 each 4   hydrocortisone valerate ointment (WEST-CORT) 0.2 % APPLY TO AFFECTED AREA(S)  TOPICALLY TWICE DAILY 120 g 1   indomethacin (INDOCIN) 25 MG capsule TAKE 1 CAPSULE BY MOUTH 3   TIMES A DAY AS NEEDED 270 capsule 3   insulin glargine (LANTUS SOLOSTAR) 100 UNIT/ML Solostar Pen Inject 25 Units into the skin daily. (Patient taking differently: Inject 25 Units into the skin daily. 22  units per pt) 1 mL 0   losartan (COZAAR) 25 MG tablet TAKE 1 TABLET(25 MG) BY MOUTH DAILY 90 tablet 1   metFORMIN (GLUCOPHAGE) 1000 MG tablet TAKE 1 TABLET BY MOUTH  TWICE DAILY WITH MEALS 180 tablet 3   omeprazole (PRILOSEC) 20 MG capsule TAKE 1 CAPSULE BY MOUTH  TWICE DAILY BEFORE MEALS 180 capsule 3   simvastatin (ZOCOR) 40 MG tablet TAKE 1 TABLET BY MOUTH AT  BEDTIME 90 tablet 3   triamcinolone lotion (KENALOG) 0.1 % APPLY TOPICALLY 3 TIMES  DAILY 180 mL 0   No current facility-administered medications on file prior to visit.    Allergies  Allergen Reactions   Glipizide Other (See Comments)    REACTION: wt. gain, hands tingling   Lisinopril Other (See Comments)    HYPERKALEMIA   Ramipril Cough   Ibuprofen     UNSPECIFIED REACTION     Past Medical History:  Diagnosis Date   Adenomatous polyp    Allergy    Arthritis    feet    Bunion 04/02/2013   STATUS POST OP BUN REPAIR   Diabetes mellitus    GERD (gastroesophageal reflux disease)    Gout    Hammertoe    Hyperlipidemia    Hypertension    Neuromuscular disorder (HCC)    neuropathy  Plantar flexed metatarsal    Psoriasis     Past Surgical History:  Procedure Laterality Date   COLONOSCOPY     FOOT AMPUTATION Right 01/11/2017   Dr Doreatha Lew ([facility])--transmetatarsal   FOOT SURGERY Right 02/28/2013   BUNIION HAM TOE2,3 PINS ,2ND MET OSTEOTOMY, 5TH MET W/SCREW   02-2013, 04-2013   HIP FRACTURE SURGERY  1993   surgery x2   POLYPECTOMY     PROSTATE BIOPSY  10/10/2019   TOTAL HIP ARTHROPLASTY     Left hip replacement/femur fx 07/04, Screw removal left hip- [facility] 11/01    Family History  Problem Relation Age of Onset   Diabetes Mother    Diabetes Brother    Cancer Neg Hx    Colon cancer Neg Hx    Colon polyps Neg Hx    Rectal cancer Neg Hx    Stomach cancer Neg Hx     Social History   Socioeconomic History   Marital status: Married    Spouse name: Not on file   Number of children: 3   Years of education:  Not on file   Highest education level: Not on file  Occupational History   Occupation: PC ADMINISTRATOR    Employer: [employer]  Tobacco Use   Smoking status: Never   Smokeless tobacco: Never  Substance and Sexual Activity   Alcohol use: Yes    Comment: occasional (DWI 1991)   Drug use: No   Sexual activity: Yes    Birth control/protection: None  Other Topics Concern   Not on file  Social History Narrative       Social Determinants of Health   Financial Resource Strain: Not on file  Food Insecurity: Not on file  Transportation Needs: Not on file  Physical Activity: Not on file  Stress: Not on file  Social Connections: Not on file  Intimate Partner Violence: Not on file   Review of Systems No fever No mouth or tongue swelling No sun exposure    Objective:   Physical Exam Skin:    Comments: Scattered plaque like (psoriatic) lesions on lateral L calf (sporadic on right) and left arm No clear bite site Desquamation seems done on plantar feet with some granulation Small plaque areas on palmar hands--but no desquamation           Assessment & Plan:

## 2021-02-25 ENCOUNTER — Telehealth: Payer: Self-pay | Admitting: Internal Medicine

## 2021-02-25 DIAGNOSIS — T7840XS Allergy, unspecified, sequela: Secondary | ICD-10-CM

## 2021-02-25 NOTE — Telephone Encounter (Signed)
Left message for [name] with information and the number to Dr Nehemiah Massed [phone].

## 2021-02-25 NOTE — Telephone Encounter (Signed)
There was also a access nurse note faxed to office but was cancelled by pt and was not put in access nurse portal to send electronically. Will send access nurse note for scanning and put copy of access nurse note in Dr Everardo Beals in box.

## 2021-02-25 NOTE — Telephone Encounter (Signed)
Let him know that I have put in an urgent referral that they should have and they can call about trying to get in ASAP

## 2021-02-25 NOTE — Telephone Encounter (Signed)
Mrs. [name] called in Mr [name] saw Dr. Silvio Pate yesterday for his foot and today its swollen more and broken out more and its on foot where his toes were amputated and wanted to know if Dr. Silvio Pate can write a referral for [facility],   At [facility] skin due to his foot has broken out and swollen, he has an appointment already for January but wanted to know if he can get in quicker.

## 2021-02-25 NOTE — Addendum Note (Signed)
Addended by: Viviana Simpler I on: 02/25/2021 10:10 AM   Modules accepted: Orders

## 2021-03-02 ENCOUNTER — Other Ambulatory Visit: Payer: Self-pay

## 2021-03-02 ENCOUNTER — Ambulatory Visit: Payer: 59 | Admitting: Dermatology

## 2021-03-02 DIAGNOSIS — L409 Psoriasis, unspecified: Secondary | ICD-10-CM | POA: Diagnosis not present

## 2021-03-02 NOTE — Patient Instructions (Signed)

## 2021-03-02 NOTE — Progress Notes (Signed)
   New Patient Visit  Subjective  [name] is a 65 y.o. male who presents for the following: Rash (Started 4 weeks ago patient got bit on the chest and L leg in the night. Then the next morning he had a rash from chest down that was hive like. Patient currently on Prednisone taper and Zyrtec QD-BID. Rash is mainly on the feet and lower leg. Skin on the feet are peeling and cracking open. Patient has a hx of psoriasis and was dx when he was 85. He is using Westcort topical ointment for psoriasis. ). Peeling of the palms started last week.   The following portions of the chart were reviewed this encounter and updated as appropriate:   Tobacco  Allergies  Meds  Problems  Med Hx  Surg Hx  Fam Hx     Review of Systems:  No other skin or systemic complaints except as noted in HPI or Assessment and Plan.  Objective  Well appearing patient in no apparent distress; mood and affect are within normal limits.  A focused examination was performed including the face, trunk, and extremities. Relevant physical exam findings are noted in the Assessment and Plan.   Assessment & Plan  Psoriasis of skin - flared Extremities Exacerbated by bug bites? (post bite reaction) -  sub erythrodermic   Psoriasis is a chronic non-curable, but treatable genetic/hereditary disease that may have other systemic features affecting other organ systems such as joints (Psoriatic Arthritis). It is associated with an increased risk of inflammatory bowel disease, heart disease, non-alcoholic fatty liver disease, and depression.    Finish Prednisone taper. Discussed with patient rebound reaction and the possibility of condition worsening. (Post steroid psoriasis flare is potential side effect)  Discussed Otezla. Sample of starter pack given to patient today. Titrate to '30mg'$  po BID until follow up appointment. Side effects of Otezla (apremilast) include diarrhea, nausea, headache, upper respiratory infection, depression,  and weight decrease (5-10%). It should only be taken by pregnant women after a discussion regarding risks and benefits with their doctor. Goal is control of skin condition, not cure.  The use of [place] Nail requires long term medication management, including periodic office visits.  If patient starts having significant s/e he should contact our office for further instruction.   Ok to continue Mupirocin 2% ointment to cracked peeling areas and Aquaphor ointment.   Return in about 1 month (around 04/02/2021) for psoriasis follow up .  Luther Redo, CMA, am acting as scribe for Sarina Ser, MD .  Documentation: I have reviewed the above documentation for accuracy and completeness, and I agree with the above.  Sarina Ser, MD

## 2021-03-08 ENCOUNTER — Encounter: Payer: Self-pay | Admitting: Dermatology

## 2021-03-16 ENCOUNTER — Encounter: Payer: Self-pay | Admitting: Dermatology

## 2021-03-30 ENCOUNTER — Other Ambulatory Visit: Payer: Self-pay

## 2021-03-30 ENCOUNTER — Ambulatory Visit: Payer: 59 | Admitting: Dermatology

## 2021-03-30 DIAGNOSIS — L409 Psoriasis, unspecified: Secondary | ICD-10-CM | POA: Diagnosis not present

## 2021-03-30 MED ORDER — WYNZORA 0.005-0.064 % EX CREA: TOPICAL_CREAM | CUTANEOUS | 1 refills | Status: DC

## 2021-03-30 NOTE — Patient Instructions (Signed)

## 2021-03-30 NOTE — Progress Notes (Signed)
   Follow-Up Visit   Subjective  [name] is a 65 y.o. male who presents for the following: Psoriasis (Patient finished Prednisone a month ago and psoriasis has worsened since being off of it. ). He has tolerated the Otezla 30 mg po BID but did experience a severe headache the first day, and nausea the first week. Patient would like to discuss other treatment options.   Accompanied by wife today who contributes to history.   The following portions of the chart were reviewed this encounter and updated as appropriate:   Tobacco  Allergies  Meds  Problems  Med Hx  Surg Hx  Fam Hx     Review of Systems:  No other skin or systemic complaints except as noted in HPI or Assessment and Plan.  Objective  Well appearing patient in no apparent distress; mood and affect are within normal limits.  A focused examination was performed including the trunk and extremities. Relevant physical exam findings are noted in the Assessment and Plan.  Trunk, extremities Almost confluent small plaques on the arms with fissures and peeling of the hands and feet. Some less significant involvement on the legs.                       Assessment & Plan  Psoriasis -severe with generalized flare and flare after treated with systemic steroids by another physician.  Patient is starting to improve but still has significant involvement.  He did not see improvement with oral Otezla - BSA 30%  Trunk, extremities Severe with questionable psoriatic arthritis  Psoriasis is a chronic non-curable, but treatable genetic/hereditary disease that may have other systemic features affecting other organ systems such as joints (Psoriatic Arthritis). It is associated with an increased risk of inflammatory bowel disease, heart disease, non-alcoholic fatty liver disease, and depression.    Pending labs start [employer]. If not covered consider Taltz or Cosentyx.Reviewed risks of biologics including immunosuppression,  infections, injection site reaction, and failure to improve condition. Goal is control of skin condition, not cure.  Some older biologics such as Humira and Enbrel may slightly increase risk of malignancy and may worsen congestive heart failure. The use of biologics requires long term medication management, including periodic office visits and monitoring of blood work.  Start Wynzora to aa's QD PRN.   Hep B Surface Antibody - Trunk, extremities  Hep B Surface Antigen - Trunk, extremities  Hep C Antibody - Trunk, extremities  HIV antibody (with reflex) - Trunk, extremities  CBC with Differential/Platelets - Trunk, extremities  CMP - Trunk, extremities  QuantiFERON-TB Gold Plus - Trunk, extremities  Calcipotriene-Betameth Diprop (WYNZORA) 0.005-0.064 % CREA - Trunk, extremities Apply to aa's psoriasis QD PRN.  Return in about 1 month (around 04/29/2021) for psoriais follow up - start [employer] .  Luther Redo, CMA, am acting as scribe for Sarina Ser, MD . Documentation: I have reviewed the above documentation for accuracy and completeness, and I agree with the above.  Sarina Ser, MD

## 2021-03-31 ENCOUNTER — Encounter: Payer: Self-pay | Admitting: Dermatology

## 2021-04-02 LAB — COMPREHENSIVE METABOLIC PANEL
ALT: 21 IU/L (ref 0–44)
AST: 30 IU/L (ref 0–40)
Albumin/Globulin Ratio: 2.3 — ABNORMAL HIGH (ref 1.2–2.2)
Albumin: 5 g/dL — ABNORMAL HIGH (ref 3.8–4.8)
Alkaline Phosphatase: 76 IU/L (ref 44–121)
BUN/Creatinine Ratio: 12 (ref 10–24)
BUN: 13 mg/dL (ref 8–27)
Bilirubin Total: 0.3 mg/dL (ref 0.0–1.2)
CO2: 25 mmol/L (ref 20–29)
Calcium: 9.9 mg/dL (ref 8.6–10.2)
Chloride: 94 mmol/L — ABNORMAL LOW (ref 96–106)
Creatinine, Ser: 1.07 mg/dL (ref 0.76–1.27)
Globulin, Total: 2.2 g/dL (ref 1.5–4.5)
Glucose: 181 mg/dL — ABNORMAL HIGH (ref 65–99)
Potassium: 5 mmol/L (ref 3.5–5.2)
Sodium: 138 mmol/L (ref 134–144)
Total Protein: 7.2 g/dL (ref 6.0–8.5)
eGFR: 77 mL/min/{1.73_m2} (ref >59)

## 2021-04-02 LAB — CBC WITH DIFFERENTIAL/PLATELET
Basophils Absolute: 0.1 10*3/uL (ref 0.0–0.2)
Basos: 1 %
EOS (ABSOLUTE): 0.3 10*3/uL (ref 0.0–0.4)
Eos: 6 %
Hematocrit: 39.6 % (ref 37.5–51.0)
Hemoglobin: 13.7 g/dL (ref 13.0–17.7)
Immature Grans (Abs): 0 10*3/uL (ref 0.0–0.1)
Immature Granulocytes: 0 %
Lymphocytes Absolute: 1.5 10*3/uL (ref 0.7–3.1)
Lymphs: 29 %
MCH: 32 pg (ref 26.6–33.0)
MCHC: 34.6 g/dL (ref 31.5–35.7)
MCV: 93 fL (ref 79–97)
Monocytes Absolute: 0.5 10*3/uL (ref 0.1–0.9)
Monocytes: 10 %
Neutrophils Absolute: 2.6 10*3/uL (ref 1.4–7.0)
Neutrophils: 54 %
Platelets: 344 10*3/uL (ref 150–450)
RBC: 4.28 x10E6/uL (ref 4.14–5.80)
RDW: 12.5 % (ref 11.6–15.4)
WBC: 5 10*3/uL (ref 3.4–10.8)

## 2021-04-02 LAB — HIV ANTIBODY (ROUTINE TESTING W REFLEX): HIV Screen 4th Generation wRfx: NONREACTIVE

## 2021-04-02 LAB — HEPATITIS C ANTIBODY: Hep C Virus Ab: <0.1 s/co ratio (ref 0.0–0.9)

## 2021-04-02 LAB — QUANTIFERON-TB GOLD PLUS

## 2021-04-02 LAB — HEPATITIS B SURFACE ANTIGEN: Hepatitis B Surface Ag: NEGATIVE

## 2021-04-02 LAB — HEPATITIS B SURFACE ANTIBODY,QUALITATIVE: Hep B Surface Ab, Qual: NONREACTIVE

## 2021-04-04 ENCOUNTER — Other Ambulatory Visit: Payer: Self-pay

## 2021-04-04 ENCOUNTER — Telehealth: Payer: Self-pay

## 2021-04-04 DIAGNOSIS — Z79899 Other long term (current) drug therapy: Secondary | ICD-10-CM

## 2021-04-04 DIAGNOSIS — L400 Psoriasis vulgaris: Secondary | ICD-10-CM

## 2021-04-04 NOTE — Progress Notes (Signed)
New TB test order. aw

## 2021-04-04 NOTE — Telephone Encounter (Signed)
Left message for patient to call office for results. When he calls back, we need to inform him that [facility] cancelled his Quantiferon test due to the sample not being refrigerated and we need to reorder it before we can send in his medication/hd

## 2021-04-04 NOTE — Telephone Encounter (Signed)
Patient's spouse came in to get a new order for the TB test due to [facility] doing the first drawing incorrectly.  Patient is complaining that he is in significant pain with his hands and feet cracking and bleeding. [name] is not improving area.

## 2021-04-05 NOTE — Telephone Encounter (Signed)
Patient has tried the voltaren gel with no relief. Patient advised as soon as we get TB test results we will submit for biologic medication.

## 2021-04-08 LAB — QUANTIFERON-TB GOLD PLUS
QuantiFERON Mitogen Value: >10 IU/mL
QuantiFERON Nil Value: 0.02 IU/mL
QuantiFERON TB1 Ag Value: 0.03 IU/mL
QuantiFERON TB2 Ag Value: 0.03 IU/mL
QuantiFERON-TB Gold Plus: NEGATIVE

## 2021-04-12 ENCOUNTER — Telehealth: Payer: Self-pay

## 2021-04-12 DIAGNOSIS — L400 Psoriasis vulgaris: Secondary | ICD-10-CM

## 2021-04-12 MED ORDER — TALTZ 80 MG/ML ~~LOC~~ SOAJ: 80.0000 mg | SUBCUTANEOUS | 5 refills | Status: DC

## 2021-04-12 MED ORDER — TALTZ 80 MG/ML ~~LOC~~ SOAJ: 160.0000 mg | SUBCUTANEOUS | 0 refills | Status: DC

## 2021-04-12 MED ORDER — TALTZ 80 MG/ML ~~LOC~~ SOAJ: 80.0000 mg | Freq: Once | SUBCUTANEOUS | 0 refills | Status: AC

## 2021-04-12 MED ORDER — TALTZ 80 MG/ML ~~LOC~~ SOAJ
80.0000 mg | SUBCUTANEOUS | 1 refills | Status: DC
Start: 2021-04-12 — End: 2021-12-14

## 2021-04-12 NOTE — Telephone Encounter (Signed)
-----   Message from Ralene Bathe, MD sent at 04/08/2021  7:00 PM EDT ----- TB test / Quantiferon Gold = negative = normal All previous lab were OK.  Send order for Taltz and schedule pt for initial injections when approved.

## 2021-04-12 NOTE — Telephone Encounter (Signed)
Advised patient of test results and Taltz sent to [facility]. Advised patient that insurance approval process can take several weeks. He does not want to reschedule his appointment for tomorrow because his hands and feet are not improving. He did state that he needs a larger quantity of Wynzora for aa's as it seems to be helping other areas of psoriasis just not his feet and hands.

## 2021-04-13 ENCOUNTER — Other Ambulatory Visit: Payer: Self-pay

## 2021-04-13 ENCOUNTER — Ambulatory Visit: Payer: 59 | Admitting: Dermatology

## 2021-04-13 DIAGNOSIS — R52 Pain, unspecified: Secondary | ICD-10-CM

## 2021-04-13 DIAGNOSIS — L409 Psoriasis, unspecified: Secondary | ICD-10-CM

## 2021-04-13 DIAGNOSIS — B079 Viral wart, unspecified: Secondary | ICD-10-CM | POA: Diagnosis not present

## 2021-04-13 DIAGNOSIS — D485 Neoplasm of uncertain behavior of skin: Secondary | ICD-10-CM

## 2021-04-13 MED ORDER — IXEKIZUMAB 80 MG/ML ~~LOC~~ SOSY
160.0000 mg | PREFILLED_SYRINGE | Freq: Once | SUBCUTANEOUS | Status: AC
  Administered 2021-04-13: 160 mg via SUBCUTANEOUS

## 2021-04-13 MED ORDER — WYNZORA 0.005-0.064 % EX CREA: TOPICAL_CREAM | CUTANEOUS | 1 refills | Status: DC

## 2021-04-13 MED ORDER — IXEKIZUMAB 80 MG/ML ~~LOC~~ SOSY: 160.0000 mg | PREFILLED_SYRINGE | Freq: Once | SUBCUTANEOUS | Status: DC

## 2021-04-13 NOTE — Patient Instructions (Signed)

## 2021-04-13 NOTE — Progress Notes (Signed)
   Follow-Up Visit   Subjective  [name] is a 65 y.o. male who presents for the following: Psoriasis (Patient c/o significant pain and worsening of flaking and crusting on the feet and hands. He has noticed an improvement on the arms and legs since using Wynzora but there is not enough medication to use on the feet. ). He also complains of traumatized lesion on his leg.  He would like it removed. The following portions of the chart were reviewed this encounter and updated as appropriate:   Tobacco  Allergies  Meds  Problems  Med Hx  Surg Hx  Fam Hx     Review of Systems:  No other skin or systemic complaints except as noted in HPI or Assessment and Plan.  Objective  Well appearing patient in no apparent distress; mood and affect are within normal limits.  A focused examination was performed including the extremities. Relevant physical exam findings are noted in the Assessment and Plan.  B/L hands, feet, trunk, extremities Almost confluent small plaques on the arms with fissures and peeling of the hands and feet. Some less significant involvement on the legs.                  R med popliteal 1.3 cm hyperkeratotic papule    Assessment & Plan  Psoriasis B/L hands, feet, trunk, extremities Worsening - flared with associated pain  Psoriasis is a chronic non-curable, but treatable genetic/hereditary disease that may have other systemic features affecting other organ systems such as joints (Psoriatic Arthritis). It is associated with an increased risk of inflammatory bowel disease, heart disease, non-alcoholic fatty liver disease, and depression.    Continue Wynzora to aa's QD PRN. Samples given today.   Due to significant side effects of pain and worsening of condition will start patient on Taltz samples today. Taltz 80mg /mL injected SQ into the R upper arm posterior and Taltz 80mg /mL injected SQ into the L upper arm posterior. Patient tolerated injections well.  AL, CMA  Ixekizumab SOSY 160 mg - B/L hands, feet, trunk, extremities  Related Medications Calcipotriene-Betameth Diprop (WYNZORA) 0.005-0.064 % CREA Apply to aa's psoriasis QD PRN.  Neoplasm of uncertain behavior of skin R med popliteal  Epidermal / dermal shaving  Lesion diameter (cm):  1.3 Informed consent: discussed and consent obtained   Timeout: patient name, date of birth, surgical site, and procedure verified   Procedure prep:  Patient was prepped and draped in usual sterile fashion Prep type:  Isopropyl alcohol Anesthesia: the lesion was anesthetized in a standard fashion   Anesthetic:  1% lidocaine w/ epinephrine 1-100,000 buffered w/ 8.4% NaHCO3 Instrument used: flexible razor blade   Hemostasis achieved with: pressure, aluminum chloride and electrodesiccation   Outcome: patient tolerated procedure well   Post-procedure details: sterile dressing applied and wound care instructions given   Dressing type: bandage and petrolatum    Anatomic Pathology Report  Return for follow up (Taltz injection/sample) and patch testing in 13 days .  Luther Redo, CMA, am acting as scribe for Sarina Ser, MD . Documentation: I have reviewed the above documentation for accuracy and completeness, and I agree with the above.  Sarina Ser, MD

## 2021-04-15 ENCOUNTER — Encounter: Payer: Self-pay | Admitting: Dermatology

## 2021-04-18 ENCOUNTER — Encounter: Payer: Self-pay | Admitting: Dermatology

## 2021-04-25 ENCOUNTER — Ambulatory Visit: Payer: 59 | Admitting: Dermatology

## 2021-04-25 ENCOUNTER — Telehealth: Payer: Self-pay

## 2021-04-25 ENCOUNTER — Ambulatory Visit: Payer: 59

## 2021-04-25 ENCOUNTER — Other Ambulatory Visit: Payer: Self-pay

## 2021-04-25 DIAGNOSIS — L400 Psoriasis vulgaris: Secondary | ICD-10-CM | POA: Diagnosis not present

## 2021-04-25 LAB — ANATOMIC PATHOLOGY REPORT

## 2021-04-25 NOTE — Progress Notes (Signed)
Patient here today to start patch testing.  True Test X36 applied to patients back. Patient given verbal instructions for patch testing and to keep area dry with no water or sweating until the panels are removed on Wednesday.

## 2021-04-25 NOTE — Telephone Encounter (Signed)
Left message on voicemail to return my call. We do not have Taltz samples for his next loading dose. What is the status of his prescription with Iantha Fallen?

## 2021-04-26 ENCOUNTER — Telehealth: Payer: Self-pay

## 2021-04-26 NOTE — Telephone Encounter (Signed)
-----   Message from Ralene Bathe, MD sent at 04/25/2021  5:10 PM EDT ----- Diagnosis synopsis: Comment  Comment: Specimen 1-Skin Biopsy, Right Medial Popliteal: VERRUCA  VULGARIS, INFLAMED  Benign viral wart May recur No further treatment at this time

## 2021-04-26 NOTE — Telephone Encounter (Signed)
Advised pt of bx result/sh ?

## 2021-04-27 ENCOUNTER — Other Ambulatory Visit: Payer: Self-pay

## 2021-04-27 ENCOUNTER — Ambulatory Visit (INDEPENDENT_AMBULATORY_CARE_PROVIDER_SITE_OTHER): Payer: 59

## 2021-04-27 DIAGNOSIS — L400 Psoriasis vulgaris: Secondary | ICD-10-CM

## 2021-04-27 NOTE — Progress Notes (Signed)
Pt here for 2nd day patch testing. Patches removed today and initial reading done. Questionable reactions from #10 ([place]) and #16 [employer]/product designer) today. Some discoloration in both areas. Instructed pt on how to have wife read test once a day over the weekend until coming in for final reading on Monday. Advised pt to mae a note of any noticeable reactions.   Pt also here for 2 week Taltz injection. Pt familiar and comfortable with self injecting. Instructed pt on how to use Taltz pen injector and then watched pt self inject today.   Taltz 80 mg injected into right lower abdomen. Pt tolerated well.

## 2021-05-02 ENCOUNTER — Telehealth: Payer: Self-pay

## 2021-05-02 ENCOUNTER — Ambulatory Visit: Payer: 59 | Admitting: Dermatology

## 2021-05-02 ENCOUNTER — Other Ambulatory Visit: Payer: Self-pay

## 2021-05-02 DIAGNOSIS — R238 Other skin changes: Secondary | ICD-10-CM | POA: Diagnosis not present

## 2021-05-02 DIAGNOSIS — R21 Rash and other nonspecific skin eruption: Secondary | ICD-10-CM | POA: Diagnosis not present

## 2021-05-02 DIAGNOSIS — L409 Psoriasis, unspecified: Secondary | ICD-10-CM | POA: Diagnosis not present

## 2021-05-02 MED ORDER — ENSTILAR 0.005-0.064 % EX FOAM: CUTANEOUS | 2 refills | Status: DC

## 2021-05-02 NOTE — Progress Notes (Signed)
   Follow-Up Visit   Subjective  [name] is a 65 y.o. male who presents for the following: Follow-up (Pt here for 3rd patch test reading ). Pt report no reaction noticed from patch testing.  Psoriasis pt taking Taltz injection pt report his feet are much better, but his hands are sore and painful.  Insurance will not cover refill of Wynzora cream until Oct 16, pt is requesting a topical cream.   The following portions of the chart were reviewed this encounter and updated as appropriate:   Tobacco  Allergies  Meds  Problems  Med Hx  Surg Hx  Fam Hx     Review of Systems:  No other skin or systemic complaints except as noted in HPI or Assessment and Plan.  Objective  Well appearing patient in no apparent distress; mood and affect are within normal limits.  A focused examination was performed including face, neck, chest and back. Relevant physical exam findings are noted in the Assessment and Plan.  B/L hands, feet, trunk, extremities Almost confluent small plaques on the arms with fissures and peeling of the hands and feet. Some less significant involvement on the legs.     Assessment & Plan  Psoriasis with associated pain- currently on Taltz With some improvement But hands with fissures (Discussed that his condition is most consistent with psoriasis and he is improving on Taltz, but we have question of allergic contact dermatitis with seems out now since patch tests were negative.  Other possibilities include eczema/atopic dermatitis) Consider biopsy in future if does not continue to improve on Taltz. B/L hands, feet, trunk, extremities  Psoriasis is a chronic non-curable, but treatable genetic/hereditary disease that may have other systemic features affecting other organ systems such as joints (Psoriatic Arthritis). It is associated with an increased risk of inflammatory bowel disease, heart disease, non-alcoholic fatty liver disease, and depression.     Cont Taltz  injection as directed  Avaya foam apply to skin qd-bid prn Cont Wynzora cream   Related Medications Calcipotriene-Betameth Diprop (WYNZORA) 0.005-0.064 % CREA Apply to aa's psoriasis QD PRN.  Calcipotriene-Betameth Diprop (ENSTILAR) 0.005-0.064 % FOAM Apply to skin once or twice a day  Rash-rule out allergic contact dermatitis Patch testing negative today at 1 week.  Return in about 6 weeks (around 06/13/2021) for Psoriasis.  IMarye Round, CMA, am acting as scribe for Sarina Ser, MD .  Documentation: I have reviewed the above documentation for accuracy and completeness, and I agree with the above.  Sarina Ser, MD

## 2021-05-02 NOTE — Telephone Encounter (Signed)
Patient's spouse called regarding appointment today. She states patient may forget but she wanted to add that he is miserable and in so much pain to the touch with his hands. Spouse states that his skin hurts so bad he can barley get dressed and touch clothing.

## 2021-05-02 NOTE — Patient Instructions (Signed)

## 2021-05-03 ENCOUNTER — Encounter: Payer: Self-pay | Admitting: Dermatology

## 2021-05-08 ENCOUNTER — Other Ambulatory Visit: Payer: Self-pay | Admitting: Internal Medicine

## 2021-05-11 ENCOUNTER — Other Ambulatory Visit: Payer: Self-pay | Admitting: Internal Medicine

## 2021-05-27 ENCOUNTER — Ambulatory Visit: Payer: 59 | Admitting: Internal Medicine

## 2021-06-13 ENCOUNTER — Other Ambulatory Visit: Payer: Self-pay

## 2021-06-13 ENCOUNTER — Ambulatory Visit: Payer: 59 | Admitting: Dermatology

## 2021-06-13 DIAGNOSIS — L400 Psoriasis vulgaris: Secondary | ICD-10-CM | POA: Diagnosis not present

## 2021-06-13 MED ORDER — VTAMA 1 % EX CREA: 1.0000 "application " | TOPICAL_CREAM | Freq: Every day | CUTANEOUS | 2 refills | Status: DC

## 2021-06-13 NOTE — Patient Instructions (Signed)

## 2021-06-13 NOTE — Progress Notes (Signed)
   Follow-Up Visit   Subjective  [name] is a 65 y.o. male who presents for the following: Psoriasis (6 weeks f/u on Psoriasis on the hands, treating with Taltz injections and Wynzora cream with a fair response, ). Pt report his podiatrist is treating ulcers on his feet.  Patient complains of painful splits on his fingers.  He would like more aggressive treatment for this.  The following portions of the chart were reviewed this encounter and updated as appropriate:   Tobacco  Allergies  Meds  Problems  Med Hx  Surg Hx  Fam Hx     Review of Systems:  No other skin or systemic complaints except as noted in HPI or Assessment and Plan.  Objective  Well appearing patient in no apparent distress; mood and affect are within normal limits.  A focused examination was performed including extremities, including the arms, hands, fingers, and fingernails and the legs, feet, toes, and toenails. Relevant physical exam findings are noted in the Assessment and Plan.  hands, fingers, arms Well-demarcated erythematous papules/plaques with silvery scale.  Fissuring of the fingers             Assessment & Plan  Plaque psoriasis -  hands, fingers, arms  Overall,  Psoriasis is improving with Taltz but persistent and not to goal. Patient has a finger fissures that are bothersome and he would like treatment.  We will start topical Vtama qd.  Psoriasis is a chronic non-curable, but treatable genetic/hereditary disease that may have other systemic features affecting other organ systems such as joints (Psoriatic Arthritis). It is associated with an increased risk of inflammatory bowel disease, heart disease, non-alcoholic fatty liver disease, and depression.     Reviewed risks of biologics including immunosuppression, infections, injection site reaction, and failure to improve condition. Goal is control of skin condition, not cure.  Some older biologics such as Humira and Enbrel may slightly  increase risk of malignancy and may worsen congestive heart failure. The use of biologics requires long term medication management, including periodic office visits and monitoring of blood work.   Cont Taltz injection as directed  Start Vtama cream apply to skin once a day  Related Medications Tapinarof (VTAMA) 1 % CREA Apply 1 application topically daily.  Return in about 3 months (around 09/13/2021) for Psoriasis .  IMarye Round, CMA, am acting as scribe for Sarina Ser, MD .  Documentation: I have reviewed the above documentation for accuracy and completeness, and I agree with the above.  Sarina Ser, MD

## 2021-06-26 ENCOUNTER — Encounter: Payer: Self-pay | Admitting: Dermatology

## 2021-06-29 ENCOUNTER — Encounter: Payer: Self-pay | Admitting: Internal Medicine

## 2021-06-29 MED ORDER — HYDROXYZINE PAMOATE 25 MG PO CAPS: 25.0000 mg | ORAL_CAPSULE | Freq: Three times a day (TID) | ORAL | 0 refills | Status: DC | PRN

## 2021-07-28 ENCOUNTER — Encounter: Payer: Self-pay | Admitting: Internal Medicine

## 2021-08-03 ENCOUNTER — Ambulatory Visit: Payer: 59 | Admitting: Dermatology

## 2021-08-11 LAB — HM DIABETES EYE EXAM

## 2021-08-12 ENCOUNTER — Other Ambulatory Visit: Payer: Self-pay | Admitting: Internal Medicine

## 2021-08-28 ENCOUNTER — Other Ambulatory Visit: Payer: Self-pay | Admitting: Internal Medicine

## 2021-09-04 ENCOUNTER — Encounter: Payer: Self-pay | Admitting: Internal Medicine

## 2021-09-20 ENCOUNTER — Ambulatory Visit: Payer: 59 | Admitting: Dermatology

## 2021-09-29 ENCOUNTER — Ambulatory Visit: Payer: 59 | Admitting: Dermatology

## 2021-11-05 ENCOUNTER — Other Ambulatory Visit: Payer: Self-pay | Admitting: Internal Medicine

## 2021-11-07 NOTE — Telephone Encounter (Signed)
Is this okay to refill? 

## 2021-11-08 ENCOUNTER — Telehealth: Payer: Self-pay

## 2021-11-08 NOTE — Telephone Encounter (Signed)
Received PA from cover my meds,  for his omeprazole, pt was told on 09/29/21 to try the otc omeprazole. Spoke with pt he said that he hasn't when an gotten this yet, but willing to get this otc. No PA  was done for this medication. ?

## 2021-11-17 ENCOUNTER — Other Ambulatory Visit: Payer: Self-pay | Admitting: Internal Medicine

## 2021-11-24 ENCOUNTER — Ambulatory Visit: Payer: 59 | Admitting: Dermatology

## 2021-12-13 ENCOUNTER — Other Ambulatory Visit: Payer: Self-pay

## 2021-12-13 DIAGNOSIS — L400 Psoriasis vulgaris: Secondary | ICD-10-CM

## 2021-12-13 MED ORDER — TALTZ 80 MG/ML ~~LOC~~ SOAJ: 80.0000 mg | SUBCUTANEOUS | 0 refills | Status: DC

## 2021-12-13 NOTE — Progress Notes (Signed)
Refill request from [facility]. 1 refilled to get to followup

## 2021-12-14 ENCOUNTER — Encounter: Payer: Self-pay | Admitting: Internal Medicine

## 2021-12-14 ENCOUNTER — Ambulatory Visit (INDEPENDENT_AMBULATORY_CARE_PROVIDER_SITE_OTHER): Payer: 59 | Admitting: Internal Medicine

## 2021-12-14 VITALS — BP 132/78 | HR 92 | Temp 98.2°F | Ht 72.0 in | Wt 229.0 lb

## 2021-12-14 DIAGNOSIS — I10 Essential (primary) hypertension: Secondary | ICD-10-CM

## 2021-12-14 DIAGNOSIS — Z794 Long term (current) use of insulin: Secondary | ICD-10-CM

## 2021-12-14 DIAGNOSIS — E1140 Type 2 diabetes mellitus with diabetic neuropathy, unspecified: Secondary | ICD-10-CM | POA: Diagnosis not present

## 2021-12-14 DIAGNOSIS — L97521 Non-pressure chronic ulcer of other part of left foot limited to breakdown of skin: Secondary | ICD-10-CM | POA: Insufficient documentation

## 2021-12-14 DIAGNOSIS — Z0000 Encounter for general adult medical examination without abnormal findings: Secondary | ICD-10-CM

## 2021-12-14 DIAGNOSIS — K219 Gastro-esophageal reflux disease without esophagitis: Secondary | ICD-10-CM

## 2021-12-14 DIAGNOSIS — Z125 Encounter for screening for malignant neoplasm of prostate: Secondary | ICD-10-CM

## 2021-12-14 DIAGNOSIS — L408 Other psoriasis: Secondary | ICD-10-CM

## 2021-12-14 DIAGNOSIS — Z89431 Acquired absence of right foot: Secondary | ICD-10-CM

## 2021-12-14 LAB — HM DIABETES FOOT EXAM

## 2021-12-14 NOTE — Assessment & Plan Note (Signed)
BP Readings from Last 3 Encounters:  12/14/21 132/78  02/24/21 140/82  02/09/21 (!) 144/76   Good control on losartan 25, amlodipine 5

## 2021-12-14 NOTE — Assessment & Plan Note (Signed)
No problems with this foot

## 2021-12-14 NOTE — Assessment & Plan Note (Signed)
Control has slipped  On insulin 22 and metformin 1000 bid Thinks he can bring it back with lifestyle---will recheck in 3 months if elevated

## 2021-12-14 NOTE — Assessment & Plan Note (Signed)
Healthy but needs to work on fitness Pneumovax 23 when available  Flu vaccine in the fall Colon due ?2025 (will check with Dr Loletha Carrow) Will recheck PSA---did have negative biopsy at 5.8 Shingrix ---will consider

## 2021-12-14 NOTE — Assessment & Plan Note (Signed)
Working with podiatrist Good pulses--so should hopefully heal up

## 2021-12-14 NOTE — Assessment & Plan Note (Signed)
Really happy with the taltz

## 2021-12-14 NOTE — Progress Notes (Signed)
Subjective:    Patient ID: [name], male    DOB: 1956-04-13, 66 y.o.   MRN: [id number]  HPI Here for physical  Doing well other than ongoing foot pain Chronic ulcer on lateral left plantar foot Right foot okay--with the amputation  Was on prednisone for a while--due to itching (got it from someone--not prescribed) Gained some weight and now trying to get back down again Donnetta Hail has helped the itching and most of the psoriasis (including feet) Hydroxyzine helped itching but is very sedating Antihistamines OTC not much help  Knows his sugars are worse 98-188 day by day Has missed some appointments Does have left foot numbness Did increase insulin slightly (22-26) while on prednisone  Current Outpatient Medications on File Prior to Visit  Medication Sig Dispense Refill   Alcohol Swabs (B-D SINGLE USE SWABS REGULAR) PADS USE TWICE DAILY 200 each 3   amLODipine (NORVASC) 5 MG tablet TAKE 1 TABLET(5 MG) BY MOUTH DAILY 90 tablet 3   aspirin 81 MG tablet Take 81 mg by mouth daily.     B-D UF III MINI PEN NEEDLES 31G X 5 MM MISC USE AS DIRECTED DAILY 90 each 3   Calcipotriene-Betameth Diprop (ENSTILAR) 0.005-0.064 % FOAM Apply to skin once or twice a day 60 g 2   CALCIUM-MAGNESIUM-ZINC PO Take by mouth.     Cinnamon 500 MG capsule Take 500 mg by mouth 2 (two) times daily.     furosemide (LASIX) 20 MG tablet Take 1 tablet (20 mg total) by mouth daily as needed. 90 tablet 3   glucose blood (CONTOUR NEXT TEST) test strip USE 1 TO 2 TIMES DAILY TO  CHECK BLOOD SUGAR 200 strip 3   hydrocortisone valerate ointment (WEST-CORT) 0.2 % APPLY TO AFFECTED AREA(S)  TOPICALLY TWICE DAILY 120 g 1   hydrOXYzine (VISTARIL) 25 MG capsule Take 1 capsule (25 mg total) by mouth every 8 (eight) hours as needed for itching. 60 capsule 0   indomethacin (INDOCIN) 25 MG capsule TAKE 1 CAPSULE BY MOUTH 3   TIMES A DAY AS NEEDED 270 capsule 3   insulin glargine (LANTUS SOLOSTAR) 100 UNIT/ML Solostar Pen Inject  22 Units into the skin daily. 30 mL 3   Ixekizumab (TALTZ) 80 MG/ML SOAJ Inject 80 mg into the skin every 28 (twenty-eight) days. For maintenance. 1 mL 0   losartan (COZAAR) 25 MG tablet TAKE 1 TABLET(25 MG) BY MOUTH DAILY 90 tablet 0   metFORMIN (GLUCOPHAGE) 1000 MG tablet TAKE 1 TABLET BY MOUTH TWICE  DAILY WITH MEALS 180 tablet 0   omeprazole (PRILOSEC) 20 MG capsule TAKE 1 CAPSULE BY MOUTH  TWICE DAILY BEFORE MEALS 180 capsule 0   simvastatin (ZOCOR) 40 MG tablet TAKE 1 TABLET BY MOUTH AT  BEDTIME 90 tablet 0   triamcinolone lotion (KENALOG) 0.1 % APPLY TO AFFECTED AREA(S)  TOPICALLY 3 TIMES DAILY 180 mL 1   Tapinarof (VTAMA) 1 % CREA Apply 1 application topically daily. (Patient not taking: Reported on 12/14/2021) 60 g 2   No current facility-administered medications on file prior to visit.    Allergies  Allergen Reactions   Glipizide Other (See Comments)    REACTION: wt. gain, hands tingling   Lisinopril Other (See Comments)    HYPERKALEMIA   Ramipril Cough   Ibuprofen     UNSPECIFIED REACTION     Past Medical History:  Diagnosis Date   Adenomatous polyp    Allergy    Arthritis    feet  Bunion 04/02/2013   STATUS POST OP BUN REPAIR   Diabetes mellitus    GERD (gastroesophageal reflux disease)    Gout    Hammertoe    Hyperlipidemia    Hypertension    Neuromuscular disorder (HCC)    neuropathy   Plantar flexed metatarsal    Psoriasis     Past Surgical History:  Procedure Laterality Date   COLONOSCOPY     FOOT AMPUTATION Right 01/11/2017   Dr Doreatha Lew ([facility])--transmetatarsal   FOOT SURGERY Right 02/28/2013   BUNIION HAM TOE2,3 PINS ,2ND MET OSTEOTOMY, 5TH MET W/SCREW   02-2013, 04-2013   HIP FRACTURE SURGERY  1993   surgery x2   POLYPECTOMY     PROSTATE BIOPSY  10/10/2019   TOTAL HIP ARTHROPLASTY     Left hip replacement/femur fx 07/04, Screw removal left hip- [facility] 11/01    Family History  Problem Relation Age of Onset   Diabetes Mother    Diabetes  Brother    Cancer Neg Hx    Colon cancer Neg Hx    Colon polyps Neg Hx    Rectal cancer Neg Hx    Stomach cancer Neg Hx     Social History   Socioeconomic History   Marital status: Married    Spouse name: Not on file   Number of children: 3   Years of education: Not on file   Highest education level: Not on file  Occupational History   Occupation: PC ADMINISTRATOR    Employer: [employer]  Tobacco Use   Smoking status: Never    Passive exposure: Past   Smokeless tobacco: Never  Substance and Sexual Activity   Alcohol use: Yes    Comment: occasional (DWI 1991)   Drug use: No   Sexual activity: Yes    Birth control/protection: None  Other Topics Concern   Not on file  Social History Narrative       Social Determinants of Health   Financial Resource Strain: Not on file  Food Insecurity: Not on file  Transportation Needs: Not on file  Physical Activity: Not on file  Stress: Not on file  Social Connections: Not on file  Intimate Partner Violence: Not on file   Review of Systems  Constitutional:  Negative for fatigue and unexpected weight change.       Wears seat belt Very little exercise  HENT:  Positive for tinnitus. Negative for dental problem, hearing loss and trouble swallowing.        Keeps up with dentist  Eyes:        Bilateral cataracts--not ready for surgery till next year  Respiratory:  Negative for cough, chest tightness and shortness of breath.   Cardiovascular:  Positive for leg swelling. Negative for chest pain and palpitations.       Uses furosemide 1-2 times a month   Gastrointestinal:  Negative for blood in stool and constipation.       No heartburn with med  Endocrine: Negative for polydipsia and polyuria.  Genitourinary:        Mild delay in voiding---stream is okay. Empties okay Nocturia x 1 No sexual problems  Musculoskeletal:  Positive for back pain. Negative for arthralgias and myalgias.  Skin:  Positive for rash.       Keeps up with derm   Allergic/Immunologic: Positive for environmental allergies. Negative for immunocompromised state.       Uses zyrtec  Neurological:  Negative for dizziness, syncope, light-headedness and headaches.  Hematological:  Negative for adenopathy.  Bruises/bleeds easily.  Psychiatric/Behavioral:  Negative for dysphoric mood and sleep disturbance. The patient is not nervous/anxious.       Objective:   Physical Exam Constitutional:      Appearance: Normal appearance.  HENT:     Mouth/Throat:     Pharynx: No oropharyngeal exudate or posterior oropharyngeal erythema.  Eyes:     Conjunctiva/sclera: Conjunctivae normal.     Pupils: Pupils are equal, round, and reactive to light.  Cardiovascular:     Rate and Rhythm: Normal rate and regular rhythm.     Pulses: Normal pulses.     Heart sounds: No murmur heard.   No gallop.  Pulmonary:     Effort: Pulmonary effort is normal.     Breath sounds: Normal breath sounds. No wheezing or rales.  Abdominal:     Palpations: Abdomen is soft.     Tenderness: There is no abdominal tenderness.  Musculoskeletal:     Cervical back: Neck supple.     Right lower leg: No edema.     Left lower leg: No edema.     Comments: Healed right transmetatarsal amputation  Lymphadenopathy:     Cervical: No cervical adenopathy.  Skin:    Comments: Stage 2 clean ulcer overlying left 5th metatarsal head  Neurological:     General: No focal deficit present.     Mental Status: He is alert and oriented to person, place, and time.     Comments: ? Slightly reduced sensation in feet  Psychiatric:        Mood and Affect: Mood normal.        Behavior: Behavior normal.           Assessment & Plan:

## 2021-12-14 NOTE — Assessment & Plan Note (Signed)
Quiet on omeprazole 20

## 2021-12-14 NOTE — Addendum Note (Signed)
Addended by: Ellamae Sia on: 12/14/2021 10:09 AM   Modules accepted: Orders

## 2021-12-15 ENCOUNTER — Telehealth: Payer: Self-pay

## 2021-12-15 LAB — COMPREHENSIVE METABOLIC PANEL
ALT: 23 IU/L (ref 0–44)
AST: 33 IU/L (ref 0–40)
Albumin/Globulin Ratio: 1.8 (ref 1.2–2.2)
Albumin: 4.4 g/dL (ref 3.8–4.8)
Alkaline Phosphatase: 62 IU/L (ref 44–121)
BUN/Creatinine Ratio: 6 — ABNORMAL LOW (ref 10–24)
BUN: 6 mg/dL — ABNORMAL LOW (ref 8–27)
Bilirubin Total: 0.5 mg/dL (ref 0.0–1.2)
CO2: 26 mmol/L (ref 20–29)
Calcium: 9.3 mg/dL (ref 8.6–10.2)
Chloride: 95 mmol/L — ABNORMAL LOW (ref 96–106)
Creatinine, Ser: 1.06 mg/dL (ref 0.76–1.27)
Globulin, Total: 2.5 g/dL (ref 1.5–4.5)
Glucose: 162 mg/dL — ABNORMAL HIGH (ref 70–99)
Potassium: 5.3 mmol/L — ABNORMAL HIGH (ref 3.5–5.2)
Sodium: 137 mmol/L (ref 134–144)
Total Protein: 6.9 g/dL (ref 6.0–8.5)
eGFR: 77 mL/min/{1.73_m2} (ref >59)

## 2021-12-15 LAB — LIPID PANEL
Chol/HDL Ratio: 1.5 ratio (ref 0.0–5.0)
Cholesterol, Total: 169 mg/dL (ref 100–199)
HDL: 112 mg/dL (ref >39)
LDL Chol Calc (NIH): 45 mg/dL (ref 0–99)
Triglycerides: 60 mg/dL (ref 0–149)
VLDL Cholesterol Cal: 12 mg/dL (ref 5–40)

## 2021-12-15 LAB — MICROALBUMIN / CREATININE URINE RATIO
Creatinine, Urine: 67.6 mg/dL
Microalb/Creat Ratio: 48 mg/g creat — ABNORMAL HIGH (ref 0–29)
Microalbumin, Urine: 32.4 ug/mL

## 2021-12-15 LAB — PSA: Prostate Specific Ag, Serum: 5.9 ng/mL — ABNORMAL HIGH (ref 0.0–4.0)

## 2021-12-15 LAB — HEMOGLOBIN A1C
Est. average glucose Bld gHb Est-mCnc: 171 mg/dL
Hgb A1c MFr Bld: 7.6 % — ABNORMAL HIGH (ref 4.8–5.6)

## 2021-12-15 LAB — CBC
Hematocrit: 39.6 % (ref 37.5–51.0)
Hemoglobin: 13.4 g/dL (ref 13.0–17.7)
MCH: 32.2 pg (ref 26.6–33.0)
MCHC: 33.8 g/dL (ref 31.5–35.7)
MCV: 95 fL (ref 79–97)
Platelets: 275 10*3/uL (ref 150–450)
RBC: 4.16 x10E6/uL (ref 4.14–5.80)
RDW: 12 % (ref 11.6–15.4)
WBC: 4.3 10*3/uL (ref 3.4–10.8)

## 2021-12-15 NOTE — Telephone Encounter (Signed)
-----   Message from Doran Stabler, MD sent at 12/15/2021 11:51 AM EDT ----- Regarding: [name] procedure Toriann Spadoni,  I forwarded you an office note from this patient's recent primary care visit.  He is due for surveillance colonoscopy with me.  Please help him make arrangements for an [place] previsit.  HD

## 2021-12-15 NOTE — Telephone Encounter (Signed)
Lm on vm for patient to return call 

## 2021-12-21 NOTE — Telephone Encounter (Signed)
Lm on vm for patient to return call 

## 2021-12-23 NOTE — Telephone Encounter (Signed)
Lm on vm for patient to return call 

## 2021-12-26 NOTE — Telephone Encounter (Signed)
No return call received. Letter mailed to patient to contact the office. 

## 2022-01-05 ENCOUNTER — Ambulatory Visit: Payer: 59 | Admitting: Dermatology

## 2022-01-05 DIAGNOSIS — L408 Other psoriasis: Secondary | ICD-10-CM | POA: Diagnosis not present

## 2022-01-05 DIAGNOSIS — L97529 Non-pressure chronic ulcer of other part of left foot with unspecified severity: Secondary | ICD-10-CM | POA: Diagnosis not present

## 2022-01-05 DIAGNOSIS — L400 Psoriasis vulgaris: Secondary | ICD-10-CM

## 2022-01-05 DIAGNOSIS — E11621 Type 2 diabetes mellitus with foot ulcer: Secondary | ICD-10-CM | POA: Diagnosis not present

## 2022-01-05 DIAGNOSIS — L409 Psoriasis, unspecified: Secondary | ICD-10-CM

## 2022-01-05 MED ORDER — TALTZ 80 MG/ML ~~LOC~~ SOAJ: 80.0000 mg | SUBCUTANEOUS | 6 refills | Status: DC

## 2022-01-05 MED ORDER — KETOCONAZOLE 2 % EX SHAM: 1.0000 | MEDICATED_SHAMPOO | CUTANEOUS | 11 refills | Status: DC

## 2022-01-05 MED ORDER — VTAMA 1 % EX CREA: 1.0000 | TOPICAL_CREAM | Freq: Every day | CUTANEOUS | 11 refills | Status: DC

## 2022-01-05 NOTE — Progress Notes (Unsigned)
   Follow-Up Visit   Subjective  [name] is a 66 y.o. male who presents for the following: Psoriasis (Hands, fingers, arms, feet, 15mf/u, Taltz sq injections q 28 days, Vtama qd, Wynzora qd).  ***  The following portions of the chart were reviewed this encounter and updated as appropriate:       Review of Systems:  No other skin or systemic complaints except as noted in HPI or Assessment and Plan.  Objective  Well appearing patient in no apparent distress; mood and affect are within normal limits.  A focused examination was performed including hands, feet. Relevant physical exam findings are noted in the Assessment and Plan.  bil hands, fingers, arms, bil feet Areas of scale hyperkeratosis and fissure R plantar foot, 3 guttate scaly areas hands       Scalp Scalp clear    Assessment & Plan   Diabetic Foot [name] plantar foot - Pt cont f/u with Dr. KCaryl Comes  Psoriasis bil hands, fingers, arms, bil feet  Chronic and persistent condition with duration or expected duration over one year. Condition is symptomatic / bothersome to patient. Not to goal.   Psoriasis - severe on systemic "biologic" treatment injections.  Psoriasis is a chronic non-curable, but treatable genetic/hereditary disease that may have other systemic features affecting other organ systems such as joints (Psoriatic Arthritis).  It is linked with heart disease, inflammatory bowel disease, non-alcoholic fatty liver disease, and depression. Significant skin psoriasis and/or psoriatic arthritis may have significant symptoms and affects activities of daily activity and often benefits from systemic "biologic" injection treatments.  These "biologic" treatments have some potential side effects including immunosuppression and require pre-treatment laboratory screening and periodic laboratory monitoring and periodic in person evaluation and monitoring by the attending dermatologist physician (long term medication  management).   Cont Taltz sq injections q 28 days Cont Vtama cr qd prn flares[id number] 07/20[facility] Will get labs in the next 3 months, order given to pt   Tapinarof (VTAMA) 1 % CREA - bil hands, fingers, arms, bil feet Apply 1 Application topically daily. Qd to aa psoriasis on hands, feet, arms  Related Procedures Comprehensive metabolic panel CBC with Differential/Platelet QuantiFERON-TB Gold Plus  Sebopsoriasis Scalp  With Pruritis  Start Ketoconazole 2% shampoo qo shampoo, let sit 5 minutes and rinse out  If not improving after 2 months will add Mometasone solution  ketoconazole (NIZORAL) 2 % shampoo - Scalp Apply 1 Application topically as directed. Wash scalp with Ketoconazole shampoo qo shampoo, let sit for 5 minutes and rinse out  Psoriasis vulgaris  Related Medications Ixekizumab (TALTZ) 80 MG/ML SOAJ Inject 80 mg into the skin every 28 (twenty-eight) days. For maintenance.   Return in about 6 months (around 07/07/2022) for Psoriasis f/u.  I, SOthelia Pulling RMA, am acting as scribe for DSarina Ser MD .

## 2022-01-05 NOTE — Patient Instructions (Signed)
Due to recent changes in healthcare laws, you may see results of your pathology and/or laboratory studies on MyChart before the doctors have had a chance to review them. We understand that in some cases there may be results that are confusing or concerning to you. Please understand that not all results are received at the same time and often the doctors may need to interpret multiple results in order to provide you with the best plan of care or course of treatment. Therefore, we ask that you please give us 2 business days to thoroughly review all your results before contacting the office for clarification. Should we see a critical lab result, you will be contacted sooner.   If You Need Anything After Your Visit  If you have any questions or concerns for your doctor, please call our main line at [phone] and press option 4 to reach your doctor's medical assistant. If no one answers, please leave a voicemail as directed and we will return your call as soon as possible. Messages left after 4 pm will be answered the following business day.   You may also send us a message via MyChart. We typically respond to MyChart messages within 1-2 business days.  For prescription refills, please ask your pharmacy to contact our office. Our fax number is [phone].  If you have an urgent issue when the clinic is closed that cannot wait until the next business day, you can page your doctor at the number below.    Please note that while we do our best to be available for urgent issues outside of office hours, we are not available 24/7.   If you have an urgent issue and are unable to reach us, you may choose to seek medical care at your doctor's office, retail clinic, urgent care center, or emergency room.  If you have a medical emergency, please immediately call 911 or go to the emergency department.  Pager Numbers  - Dr. Kowalski: [phone]  - Dr. Moye: [phone]  - Dr. Stewart:  [phone]  In the event of inclement weather, please call our main line at [phone] for an update on the status of any delays or closures.  Dermatology Medication Tips: Please keep the boxes that topical medications come in in order to help keep track of the instructions about where and how to use these. Pharmacies typically print the medication instructions only on the boxes and not directly on the medication tubes.   If your medication is too expensive, please contact our office at [phone] option 4 or send us a message through MyChart.   We are unable to tell what your co-pay for medications will be in advance as this is different depending on your insurance coverage. However, we may be able to find a substitute medication at lower cost or fill out paperwork to get insurance to cover a needed medication.   If a prior authorization is required to get your medication covered by your insurance company, please allow us 1-2 business days to complete this process.  Drug prices often vary depending on where the prescription is filled and some pharmacies may offer cheaper prices.  The website www.goodrx.com contains coupons for medications through different pharmacies. The prices here do not account for what the cost may be with help from insurance (it may be cheaper with your insurance), but the website can give you the price if you did not use any insurance.  - You can print the associated coupon and take it with   your prescription to the pharmacy.  - You may also stop by our office during regular business hours and pick up a GoodRx coupon card.  - If you need your prescription sent electronically to a different pharmacy, notify our office through [facility] MyChart or by phone at [phone] option 4.     Si Usted Necesita Algo Despus de Su Visita  Tambin puede enviarnos un mensaje a travs de MyChart. Por lo general respondemos a los mensajes de MyChart en el transcurso de 1 a 2  das hbiles.  Para renovar recetas, por favor pida a su farmacia que se ponga en contacto con nuestra oficina. Nuestro nmero de fax es el [phone].  Si tiene un asunto urgente cuando la clnica est cerrada y que no puede esperar hasta el siguiente da hbil, puede llamar/localizar a su doctor(a) al nmero que aparece a continuacin.   Por favor, tenga en cuenta que aunque hacemos todo lo posible para estar disponibles para asuntos urgentes fuera del horario de oficina, no estamos disponibles las 24 horas del da, los 7 das de la semana.   Si tiene un problema urgente y no puede comunicarse con nosotros, puede optar por buscar atencin mdica  en el consultorio de su doctor(a), en una clnica privada, en un centro de atencin urgente o en una sala de emergencias.  Si tiene una emergencia mdica, por favor llame inmediatamente al 911 o vaya a la sala de emergencias.  Nmeros de bper  - Dr. Kowalski: [phone]  - Dra. Moye: [phone]  - Dra. Stewart: [phone]  En caso de inclemencias del tiempo, por favor llame a nuestra lnea principal al [phone] para una actualizacin sobre el estado de cualquier retraso o cierre.  Consejos para la medicacin en dermatologa: Por favor, guarde las cajas en las que vienen los medicamentos de uso tpico para ayudarle a seguir las instrucciones sobre dnde y cmo usarlos. Las farmacias generalmente imprimen las instrucciones del medicamento slo en las cajas y no directamente en los tubos del medicamento.   Si su medicamento es muy caro, por favor, pngase en contacto con nuestra oficina llamando al [phone] y presione la opcin 4 o envenos un mensaje a travs de MyChart.   No podemos decirle cul ser su copago por los medicamentos por adelantado ya que esto es diferente dependiendo de la cobertura de su seguro. Sin embargo, es posible que podamos encontrar un medicamento sustituto a menor costo o llenar un formulario para que el  seguro cubra el medicamento que se considera necesario.   Si se requiere una autorizacin previa para que su compaa de seguros cubra su medicamento, por favor permtanos de 1 a 2 das hbiles para completar este proceso.  Los precios de los medicamentos varan con frecuencia dependiendo del lugar de dnde se surte la receta y alguna farmacias pueden ofrecer precios ms baratos.  El sitio web www.goodrx.com tiene cupones para medicamentos de diferentes farmacias. Los precios aqu no tienen en cuenta lo que podra costar con la ayuda del seguro (puede ser ms barato con su seguro), pero el sitio web puede darle el precio si no utiliz ningn seguro.  - Puede imprimir el cupn correspondiente y llevarlo con su receta a la farmacia.  - Tambin puede pasar por nuestra oficina durante el horario de atencin regular y recoger una tarjeta de cupones de GoodRx.  - Si necesita que su receta se enve electrnicamente a una farmacia diferente, informe a nuestra oficina a travs de MyChart de [facility]   o por telfono llamando al [phone] y presione la opcin 4.  

## 2022-01-09 ENCOUNTER — Encounter: Payer: Self-pay | Admitting: Internal Medicine

## 2022-01-09 MED ORDER — GABAPENTIN 300 MG PO CAPS: 300.0000 mg | ORAL_CAPSULE | Freq: Three times a day (TID) | ORAL | 3 refills | Status: DC

## 2022-01-10 ENCOUNTER — Encounter: Payer: Self-pay | Admitting: Dermatology

## 2022-01-12 ENCOUNTER — Other Ambulatory Visit: Payer: Self-pay | Admitting: Internal Medicine

## 2022-01-25 ENCOUNTER — Other Ambulatory Visit: Payer: Self-pay

## 2022-01-25 DIAGNOSIS — L400 Psoriasis vulgaris: Secondary | ICD-10-CM

## 2022-01-25 MED ORDER — TALTZ 80 MG/ML ~~LOC~~ SOAJ: 80.0000 mg | SUBCUTANEOUS | 6 refills | Status: DC

## 2022-01-25 NOTE — Progress Notes (Signed)
[name] switching to [employer] per patients insurance requirement. Escripted to [employer]

## 2022-02-05 ENCOUNTER — Other Ambulatory Visit: Payer: Self-pay | Admitting: Internal Medicine

## 2022-03-29 ENCOUNTER — Encounter: Payer: Self-pay | Admitting: Internal Medicine

## 2022-03-29 ENCOUNTER — Telehealth: Payer: Self-pay | Admitting: *Deleted

## 2022-03-29 ENCOUNTER — Ambulatory Visit: Payer: 59 | Admitting: Internal Medicine

## 2022-03-29 VITALS — BP 124/84 | HR 98 | Temp 97.9°F | Ht 72.0 in | Wt 229.0 lb

## 2022-03-29 DIAGNOSIS — Z794 Long term (current) use of insulin: Secondary | ICD-10-CM

## 2022-03-29 DIAGNOSIS — E1140 Type 2 diabetes mellitus with diabetic neuropathy, unspecified: Secondary | ICD-10-CM

## 2022-03-29 DIAGNOSIS — N410 Acute prostatitis: Secondary | ICD-10-CM | POA: Insufficient documentation

## 2022-03-29 DIAGNOSIS — L408 Other psoriasis: Secondary | ICD-10-CM | POA: Diagnosis not present

## 2022-03-29 NOTE — Progress Notes (Signed)
Subjective:    Patient ID: [name], male    DOB: 03-07-1956, 66 y.o.   MRN: [id number]  HPI Here with urinary symptoms and a low blood sugar  He has had some urinary symptoms for 2-2.5 weeks Hard time peeing Has burning dysuria Some pain in low back Bad smell in urine  Had bad low sugar reaction last night---EMS came (39) Got IV---?glucagon===sugar up to 180 Refused going to ER  Prior --no urinary problems Stream was fine before  Seen at urgent care Urinalysis was not consistent with UTI--but started empiric Rx (cipro) and urine being sent out  Did improve his eating after last A1c Now down to 6.5% at biometric screen for [facility] Cut insulin from 22-20 Taking alpha lipoic acid--low sugars listed as possible side effects  Current Outpatient Medications on File Prior to Visit  Medication Sig Dispense Refill   Alcohol Swabs (B-D SINGLE USE SWABS REGULAR) PADS USE TWICE DAILY 200 each 3   Alpha-Lipoic Acid 600 MG TABS Take by mouth. 2 in am and 1 in pm     amLODipine (NORVASC) 5 MG tablet TAKE 1 TABLET(5 MG) BY MOUTH DAILY 90 tablet 3   aspirin 81 MG tablet Take 81 mg by mouth daily.     B-D UF III MINI PEN NEEDLES 31G X 5 MM MISC USE AS DIRECTED DAILY 90 each 3   CALCIUM-MAGNESIUM-ZINC PO Take by mouth.     Cinnamon 500 MG capsule Take 500 mg by mouth 2 (two) times daily.     furosemide (LASIX) 20 MG tablet Take 1 tablet (20 mg total) by mouth daily as needed. 90 tablet 3   gabapentin (NEURONTIN) 300 MG capsule Take 1-2 capsules (300-600 mg total) by mouth 3 (three) times daily. 180 capsule 3   glucose blood (CONTOUR NEXT TEST) test strip USE 1 TO 2 TIMES DAILY TO  CHECK BLOOD SUGAR 200 strip 3   hydrocortisone valerate ointment (WEST-CORT) 0.2 % APPLY TO AFFECTED AREA(S)  TOPICALLY TWICE DAILY 120 g 1   hydrOXYzine (VISTARIL) 25 MG capsule Take 1 capsule (25 mg total) by mouth every 8 (eight) hours as needed for itching. 60 capsule 0   indomethacin (INDOCIN) 25 MG  capsule TAKE 1 CAPSULE BY MOUTH 3   TIMES A DAY AS NEEDED 270 capsule 3   insulin glargine (LANTUS SOLOSTAR) 100 UNIT/ML Solostar Pen Inject 22 Units into the skin daily. 30 mL 3   Ixekizumab (TALTZ) 80 MG/ML SOAJ Inject 80 mg into the skin every 28 (twenty-eight) days. For maintenance. 1 mL 6   ketoconazole (NIZORAL) 2 % shampoo Apply 1 Application topically as directed. Wash scalp with Ketoconazole shampoo qo shampoo, let sit for 5 minutes and rinse out 120 mL 11   losartan (COZAAR) 25 MG tablet TAKE 1 TABLET(25 MG) BY MOUTH DAILY 90 tablet 3   metFORMIN (GLUCOPHAGE) 1000 MG tablet TAKE 1 TABLET BY MOUTH TWICE  DAILY WITH MEALS 180 tablet 3   omeprazole (PRILOSEC) 20 MG capsule TAKE 1 CAPSULE BY MOUTH TWICE  DAILY BEFORE MEALS 180 capsule 3   simvastatin (ZOCOR) 40 MG tablet TAKE 1 TABLET BY MOUTH AT  BEDTIME 90 tablet 3   Tapinarof (VTAMA) 1 % CREA Apply 1 application topically daily. 60 g 2   Tapinarof (VTAMA) 1 % CREA Apply 1 Application topically daily. Qd to aa psoriasis on hands, feet, arms 60 g 11   triamcinolone lotion (KENALOG) 0.1 % APPLY TO AFFECTED AREA(S)  TOPICALLY 3 TIMES DAILY  180 mL 1   No current facility-administered medications on file prior to visit.    Allergies  Allergen Reactions   Glipizide Other (See Comments)    REACTION: wt. gain, hands tingling   Lisinopril Other (See Comments)    HYPERKALEMIA   Ramipril Cough   Ibuprofen     UNSPECIFIED REACTION     Past Medical History:  Diagnosis Date   Adenomatous polyp    Allergy    Arthritis    feet    Bunion 04/02/2013   STATUS POST OP BUN REPAIR   Diabetes mellitus    GERD (gastroesophageal reflux disease)    Gout    Hammertoe    Hyperlipidemia    Hypertension    Neuromuscular disorder (HCC)    neuropathy   Plantar flexed metatarsal    Psoriasis     Past Surgical History:  Procedure Laterality Date   COLONOSCOPY     FOOT AMPUTATION Right 01/11/2017   Dr Doreatha Lew ([facility])--transmetatarsal   FOOT  SURGERY Right 02/28/2013   BUNIION HAM TOE2,3 PINS ,2ND MET OSTEOTOMY, 5TH MET W/SCREW   02-2013, 04-2013   HIP FRACTURE SURGERY  1993   surgery x2   POLYPECTOMY     PROSTATE BIOPSY  10/10/2019   TOTAL HIP ARTHROPLASTY     Left hip replacement/femur fx 07/04, Screw removal left hip- [facility] 11/01    Family History  Problem Relation Age of Onset   Diabetes Mother    Diabetes Brother    Alzheimer's disease Maternal Aunt    Alzheimer's disease Cousin    Cancer Neg Hx    Colon cancer Neg Hx    Colon polyps Neg Hx    Rectal cancer Neg Hx    Stomach cancer Neg Hx     Social History   Socioeconomic History   Marital status: Married    Spouse name: Not on file   Number of children: 3   Years of education: Not on file   Highest education level: Not on file  Occupational History   Occupation: PC ADMINISTRATOR    Employer: [employer]  Tobacco Use   Smoking status: Never    Passive exposure: Past   Smokeless tobacco: Never  Substance and Sexual Activity   Alcohol use: Yes    Comment: occasional (DWI 1991)   Drug use: No   Sexual activity: Yes    Birth control/protection: None  Other Topics Concern   Not on file  Social History Narrative       Social Determinants of Health   Financial Resource Strain: Not on file  Food Insecurity: Not on file  Transportation Needs: Not on file  Physical Activity: Not on file  Stress: Not on file  Social Connections: Not on file  Intimate Partner Violence: Not on file   Review of Systems No fever Vomited once this morning--gagged while brushing teeth Eating okay    Objective:   Physical Exam Constitutional:      Appearance: Normal appearance.  Cardiovascular:     Rate and Rhythm: Normal rate and regular rhythm.     Heart sounds: No murmur heard.    No gallop.  Pulmonary:     Effort: Pulmonary effort is normal.     Breath sounds: Normal breath sounds. No wheezing or rales.  Abdominal:     Palpations: Abdomen is soft.      Tenderness: There is no abdominal tenderness.  Genitourinary:    Comments: Prostate is enlarged and slightly nodular Moderate tenderness Musculoskeletal:  Cervical back: Neck supple.     Right lower leg: No edema.     Left lower leg: No edema.  Lymphadenopathy:     Cervical: No cervical adenopathy.  Neurological:     Mental Status: He is alert.  Psychiatric:        Mood and Affect: Mood normal.        Behavior: Behavior normal.            Assessment & Plan:

## 2022-03-29 NOTE — Telephone Encounter (Signed)
Glad I had something ---will see him at the appt later today

## 2022-03-29 NOTE — Assessment & Plan Note (Signed)
Has had some hypoglycemia Last A1c down to 6.5% Unclear if acute infection causing the low sugar  Asked him to cut lantus to 15 units a day---can titrate back up if the sugars go up Metformin 1000 bid still

## 2022-03-29 NOTE — Telephone Encounter (Signed)
PLEASE NOTE: All timestamps contained within this report are represented as [place] Standard Time. CONFIDENTIALTY NOTICE: This fax transmission is intended only for the addressee. It contains information that is legally privileged, confidential or otherwise protected from use or disclosure. If you are not the intended recipient, you are strictly prohibited from reviewing, disclosing, copying using or disseminating any of this information or taking any action in reliance on or regarding this information. If you have received this fax in error, please notify us immediately by telephone so that we can arrange for its return to [place]. Phone: [phone], Toll-Free: [phone], Fax: [phone] Page: 1 of 1 Call Id: [id number] [facility] Night - Client Nonclinical Telephone Record  [facility] [place] Night - Client Client Site [place] Provider Viviana Simpler- MD Contact Type Call Who Is Calling Patient / Member / Family / Caregiver Caller Name [place] Phone Number [phone] Patient Name [name] Patient DOB 11-Nov-1955 Call Type Message Only Information Provided Reason for Call Request to Schedule Office Appointment Initial Comment Caller states that her husband needs to be seen today for a UTI and a low blood sugar of 39. He was seen by an EMT and told that he needs to be seen today. Declined triage. Patient request to speak to RN No Disp. Time Disposition Final User 03/29/2022 7:58:11 AM General Information Provided Yes Windy Canny Call Closed By: Windy Canny Transaction Date/Time: 03/29/2022 7:55:10 AM (ET)

## 2022-03-29 NOTE — Assessment & Plan Note (Signed)
Clear urinary symptoms with a benign urinalysis Enlarged/tender prostate Okay to take the prescribed cipro--but may need extended Rx

## 2022-03-29 NOTE — Assessment & Plan Note (Signed)
Is on taltz Will check labs

## 2022-03-29 NOTE — Telephone Encounter (Signed)
Patient has an appointment scheduled today 03/29/22 with Dr. Silvio Pate at 12:00 noon.

## 2022-04-01 LAB — COMPREHENSIVE METABOLIC PANEL
ALT: 24 IU/L (ref 0–44)
AST: 33 IU/L (ref 0–40)
Albumin/Globulin Ratio: 2.2 (ref 1.2–2.2)
Albumin: 4.4 g/dL (ref 3.9–4.9)
Alkaline Phosphatase: 92 IU/L (ref 44–121)
BUN/Creatinine Ratio: 7 — ABNORMAL LOW (ref 10–24)
BUN: 8 mg/dL (ref 8–27)
Bilirubin Total: 0.4 mg/dL (ref 0.0–1.2)
CO2: 19 mmol/L — ABNORMAL LOW (ref 20–29)
Calcium: 9.5 mg/dL (ref 8.6–10.2)
Chloride: 95 mmol/L — ABNORMAL LOW (ref 96–106)
Creatinine, Ser: 1.07 mg/dL (ref 0.76–1.27)
Globulin, Total: 2 g/dL (ref 1.5–4.5)
Glucose: 159 mg/dL — ABNORMAL HIGH (ref 70–99)
Potassium: 5 mmol/L (ref 3.5–5.2)
Sodium: 136 mmol/L (ref 134–144)
Total Protein: 6.4 g/dL (ref 6.0–8.5)
eGFR: 77 mL/min/{1.73_m2} (ref >59)

## 2022-04-01 LAB — CBC
Hematocrit: 35.4 % — ABNORMAL LOW (ref 37.5–51.0)
Hemoglobin: 12.2 g/dL — ABNORMAL LOW (ref 13.0–17.7)
MCH: 33.2 pg — ABNORMAL HIGH (ref 26.6–33.0)
MCHC: 34.5 g/dL (ref 31.5–35.7)
MCV: 97 fL (ref 79–97)
Platelets: 254 10*3/uL (ref 150–450)
RBC: 3.67 x10E6/uL — ABNORMAL LOW (ref 4.14–5.80)
RDW: 14.6 % (ref 11.6–15.4)
WBC: 5.6 10*3/uL (ref 3.4–10.8)

## 2022-04-01 LAB — QUANTIFERON-TB GOLD PLUS
QuantiFERON Mitogen Value: >10 IU/mL
QuantiFERON Nil Value: 0.06 IU/mL
QuantiFERON TB1 Ag Value: 0.07 IU/mL
QuantiFERON TB2 Ag Value: 0.06 IU/mL
QuantiFERON-TB Gold Plus: NEGATIVE

## 2022-04-06 ENCOUNTER — Encounter: Payer: Self-pay | Admitting: Internal Medicine

## 2022-04-11 ENCOUNTER — Encounter: Payer: Self-pay | Admitting: Internal Medicine

## 2022-04-12 MED ORDER — DOXYCYCLINE HYCLATE 100 MG PO TABS: 100.0000 mg | ORAL_TABLET | Freq: Two times a day (BID) | ORAL | 1 refills | Status: DC

## 2022-04-17 ENCOUNTER — Encounter: Payer: Self-pay | Admitting: Internal Medicine

## 2022-04-17 MED ORDER — HYDROXYZINE PAMOATE 25 MG PO CAPS: 25.0000 mg | ORAL_CAPSULE | Freq: Three times a day (TID) | ORAL | 0 refills | Status: DC | PRN

## 2022-05-08 ENCOUNTER — Other Ambulatory Visit: Payer: Self-pay | Admitting: Internal Medicine

## 2022-05-14 ENCOUNTER — Other Ambulatory Visit: Payer: Self-pay | Admitting: Internal Medicine

## 2022-05-19 ENCOUNTER — Other Ambulatory Visit: Payer: Self-pay | Admitting: Internal Medicine

## 2022-06-12 ENCOUNTER — Encounter: Payer: Self-pay | Admitting: Internal Medicine

## 2022-06-12 ENCOUNTER — Ambulatory Visit: Payer: 59 | Admitting: Internal Medicine

## 2022-06-12 VITALS — BP 138/88 | HR 108 | Temp 97.1°F | Ht 72.0 in | Wt 224.0 lb

## 2022-06-12 DIAGNOSIS — E1140 Type 2 diabetes mellitus with diabetic neuropathy, unspecified: Secondary | ICD-10-CM | POA: Diagnosis not present

## 2022-06-12 DIAGNOSIS — D649 Anemia, unspecified: Secondary | ICD-10-CM | POA: Insufficient documentation

## 2022-06-12 DIAGNOSIS — N410 Acute prostatitis: Secondary | ICD-10-CM

## 2022-06-12 MED ORDER — DULOXETINE HCL 30 MG PO CPEP: 30.0000 mg | ORAL_CAPSULE | Freq: Every day | ORAL | 3 refills | Status: DC

## 2022-06-12 MED ORDER — SULFAMETHOXAZOLE-TRIMETHOPRIM 800-160 MG PO TABS: 1.0000 | ORAL_TABLET | Freq: Two times a day (BID) | ORAL | 1 refills | Status: DC

## 2022-06-12 NOTE — Progress Notes (Signed)
Subjective:    Patient ID: [name], male    DOB: 1956-07-19, 66 y.o.   MRN: [id number]  HPI Here with concern about another prostate infection  He didn't feel that the cipro or doxycycline cleared his symptoms Hit "hard" 2-3 days ago Trouble initiating stream---then stops Burning dysuria Feels the same as before  No fever  Current Outpatient Medications on File Prior to Visit  Medication Sig Dispense Refill   Alcohol Swabs (B-D SINGLE USE SWABS REGULAR) PADS USE 1 PREP PAD TWICE DAILY 200 each 3   Alpha-Lipoic Acid 600 MG TABS Take by mouth. 2 in am and 1 in pm     amLODipine (NORVASC) 5 MG tablet TAKE 1 TABLET(5 MG) BY MOUTH DAILY 90 tablet 3   aspirin 81 MG tablet Take 81 mg by mouth daily.     B-D UF III MINI PEN NEEDLES 31G X 5 MM MISC USE AS DIRECTED DAILY 90 each 3   CALCIUM-MAGNESIUM-ZINC PO Take by mouth.     Cinnamon 500 MG capsule Take 500 mg by mouth 2 (two) times daily.     furosemide (LASIX) 20 MG tablet Take 1 tablet (20 mg total) by mouth daily as needed. 90 tablet 3   gabapentin (NEURONTIN) 300 MG capsule Take 1-2 capsules (300-600 mg total) by mouth 3 (three) times daily. 180 capsule 3   glucose blood (CONTOUR NEXT TEST) test strip USE 1 TO 2 TIMES DAILY TO  CHECK BLOOD SUGAR 200 strip 3   hydrocortisone valerate ointment (WEST-CORT) 0.2 % APPLY TO AFFECTED AREA(S)  TOPICALLY TWICE DAILY 120 g 1   hydrOXYzine (VISTARIL) 25 MG capsule Take 1 capsule (25 mg total) by mouth every 8 (eight) hours as needed for itching. 60 capsule 0   indomethacin (INDOCIN) 25 MG capsule TAKE 1 CAPSULE BY MOUTH 3   TIMES A DAY AS NEEDED 270 capsule 3   Ixekizumab (TALTZ) 80 MG/ML SOAJ Inject 80 mg into the skin every 28 (twenty-eight) days. For maintenance. 1 mL 6   ketoconazole (NIZORAL) 2 % shampoo Apply 1 Application topically as directed. Wash scalp with Ketoconazole shampoo qo shampoo, let sit for 5 minutes and rinse out 120 mL 11   LANTUS SOLOSTAR 100 UNIT/ML Solostar Pen  INJECT SUBCUTANEOUSLY 22  UNITS DAILY 30 mL 3   losartan (COZAAR) 25 MG tablet TAKE 1 TABLET(25 MG) BY MOUTH DAILY 90 tablet 3   metFORMIN (GLUCOPHAGE) 1000 MG tablet TAKE 1 TABLET BY MOUTH TWICE  DAILY WITH MEALS 180 tablet 3   omeprazole (PRILOSEC) 20 MG capsule TAKE 1 CAPSULE BY MOUTH TWICE  DAILY BEFORE MEALS 180 capsule 3   simvastatin (ZOCOR) 40 MG tablet TAKE 1 TABLET BY MOUTH AT  BEDTIME 90 tablet 3   Tapinarof (VTAMA) 1 % CREA Apply 1 application topically daily. 60 g 2   Tapinarof (VTAMA) 1 % CREA Apply 1 Application topically daily. Qd to aa psoriasis on hands, feet, arms 60 g 11   triamcinolone lotion (KENALOG) 0.1 % APPLY TO AFFECTED AREA(S)  TOPICALLY 3 TIMES DAILY 180 mL 1   No current facility-administered medications on file prior to visit.    Allergies  Allergen Reactions   Glipizide Other (See Comments)    REACTION: wt. gain, hands tingling   Lisinopril Other (See Comments)    HYPERKALEMIA   Ramipril Cough   Ibuprofen     UNSPECIFIED REACTION     Past Medical History:  Diagnosis Date   Adenomatous polyp  Allergy    Arthritis    feet    Bunion 04/02/2013   STATUS POST OP BUN REPAIR   Diabetes mellitus    GERD (gastroesophageal reflux disease)    Gout    Hammertoe    Hyperlipidemia    Hypertension    Neuromuscular disorder (HCC)    neuropathy   Plantar flexed metatarsal    Psoriasis     Past Surgical History:  Procedure Laterality Date   COLONOSCOPY     FOOT AMPUTATION Right 01/11/2017   Dr Doreatha Lew ([facility])--transmetatarsal   FOOT SURGERY Right 02/28/2013   BUNIION HAM TOE2,3 PINS ,2ND MET OSTEOTOMY, 5TH MET W/SCREW   02-2013, 04-2013   HIP FRACTURE SURGERY  1993   surgery x2   POLYPECTOMY     PROSTATE BIOPSY  10/10/2019   TOTAL HIP ARTHROPLASTY     Left hip replacement/femur fx 07/04, Screw removal left hip- [facility] 11/01    Family History  Problem Relation Age of Onset   Diabetes Mother    Diabetes Brother    Alzheimer's disease Maternal  Aunt    Alzheimer's disease Cousin    Cancer Neg Hx    Colon cancer Neg Hx    Colon polyps Neg Hx    Rectal cancer Neg Hx    Stomach cancer Neg Hx     Social History   Socioeconomic History   Marital status: Married    Spouse name: Not on file   Number of children: 3   Years of education: Not on file   Highest education level: Not on file  Occupational History   Occupation: PC ADMINISTRATOR    Employer: [employer]  Tobacco Use   Smoking status: Never    Passive exposure: Past   Smokeless tobacco: Never  Substance and Sexual Activity   Alcohol use: Yes    Comment: occasional (DWI 1991)   Drug use: No   Sexual activity: Yes    Birth control/protection: None  Other Topics Concern   Not on file  Social History Narrative       Social Determinants of Health   Financial Resource Strain: Not on file  Food Insecurity: Not on file  Transportation Needs: Not on file  Physical Activity: Not on file  Stress: Not on file  Social Connections: Not on file  Intimate Partner Violence: Not on file   Review of Systems Some nausea ---after eating. Usually doesn't vomit No abdominal pain--just some gas  This is no different in the last 2 days    Objective:   Physical Exam Constitutional:      Appearance: Normal appearance.  Abdominal:     Palpations: Abdomen is soft.     Tenderness: There is no abdominal tenderness. There is no right CVA tenderness, left CVA tenderness or guarding.  Neurological:     Mental Status: He is alert.            Assessment & Plan:

## 2022-06-12 NOTE — Assessment & Plan Note (Signed)
Is overdue for colonoscopy  Will contact Dr Loletha Carrow as he is ready now

## 2022-06-12 NOTE — Assessment & Plan Note (Addendum)
Symptoms are suggestive of prostatitis but I am concerned about the lack of complete resolution as well as the recurrence Urinalysis is negative Will try septra DS 1 bid for 3 weeks Refer to urology

## 2022-06-12 NOTE — Assessment & Plan Note (Signed)
His alpha lipoic acid is not helping Trouble even walking--like [place] the airport He has heard about cymbalta--discussed this is FDA approved for nerve pain Will try this at '30mg'$  daily

## 2022-06-14 ENCOUNTER — Ambulatory Visit: Payer: 59 | Admitting: Urology

## 2022-06-19 ENCOUNTER — Ambulatory Visit: Payer: 59 | Admitting: Internal Medicine

## 2022-06-19 ENCOUNTER — Encounter: Payer: Self-pay | Admitting: Internal Medicine

## 2022-06-19 ENCOUNTER — Telehealth: Payer: Self-pay

## 2022-06-19 VITALS — BP 138/80 | HR 96 | Temp 97.4°F | Ht 72.0 in | Wt 224.0 lb

## 2022-06-19 DIAGNOSIS — E1140 Type 2 diabetes mellitus with diabetic neuropathy, unspecified: Secondary | ICD-10-CM | POA: Diagnosis not present

## 2022-06-19 DIAGNOSIS — T370X1A Poisoning by sulfonamides, accidental (unintentional), initial encounter: Secondary | ICD-10-CM | POA: Insufficient documentation

## 2022-06-19 DIAGNOSIS — M79642 Pain in left hand: Secondary | ICD-10-CM | POA: Diagnosis not present

## 2022-06-19 DIAGNOSIS — N410 Acute prostatitis: Secondary | ICD-10-CM

## 2022-06-19 MED ORDER — DULOXETINE HCL 30 MG PO CPEP: 30.0000 mg | ORAL_CAPSULE | Freq: Every day | ORAL | 3 refills | Status: DC

## 2022-06-19 MED ORDER — CIPROFLOXACIN HCL 500 MG PO TABS: 500.0000 mg | ORAL_TABLET | Freq: Two times a day (BID) | ORAL | 1 refills | Status: DC

## 2022-06-19 NOTE — Telephone Encounter (Signed)
Noted Will need to revisit the doxy---or consider cipro

## 2022-06-19 NOTE — Assessment & Plan Note (Signed)
May be CTS Discussed splint and he may set up with Dr Amedeo Plenty

## 2022-06-19 NOTE — Progress Notes (Signed)
Pre-visit and colon have been scheduled tt

## 2022-06-19 NOTE — Assessment & Plan Note (Signed)
Will change back to cipro 500 bid x 10 -20 days

## 2022-06-19 NOTE — Assessment & Plan Note (Signed)
Marked improvement with duloxetine '30mg'$ 

## 2022-06-19 NOTE — Telephone Encounter (Signed)
Per appt notes pt already has appt with Dr Silvio Pate 06/19/22 at 2:45. Sending note to Dr Silvio Pate and Silvio Pate pool.    [place] Night - Client TELEPHONE ADVICE RECORD [facility] Patient Name: [name] Gender: Male DOB: [birth year] Age: 66 Y 38 M 24 D Return Phone Number: [phone] (Primary) Address: City/ State/ Zip: [place]  [address] Client [facility] Night - Client Client Site [place] - Night Provider Viviana Simpler- MD Contact Type Call Who Is Calling Patient / Member / Family / Caregiver Call Type Triage / Clinical Caller Name Benard Rink Relationship To Patient Spouse Return Phone Number [phone] (Primary) Chief Complaint Vomiting Reason for Call Request to Schedule Office Appointment Initial Comment Caller states they want to know if Dr. Silvio Pate is in the office and wants to reach someone in the office for an appt. Husband is on abx and is allergic to it. He is sick, vomiting, chills, nausea, unable to eat, and needs another abx. Translation No Nurse Assessment Nurse: Waymond Cera, RN, Benjamine Mola Date/Time (Eastern Time): 06/16/2022 8:21:40 AM Confirm and document reason for call. If symptomatic, describe symptoms. ---Wife states that husband started on antibiotic (bactrim) on Monday for prostate infection. States medication has made him sick (vomiting.) States did stop antibiotic 2 days ago. Does the patient have any new or worsening symptoms? ---Yes Will a triage be completed? ---Yes Related visit to physician within the last 2 weeks? ---Yes Does the PT have any chronic conditions? (i.e. diabetes, asthma, this includes High risk factors for pregnancy, etc.) ---Yes List chronic conditions. ---diabetes htn Is this a behavioral health or substance abuse call? ---No Nurse: Waymond Cera, RN, Benjamine Mola Date/Time (Eastern Time): 06/16/2022 8:26:13 AM Please select the assessment type  ---Verbal order / New medication order Additional Documentation ---Wife wanting a different antibiotic called in. Does the client directives allow for assistance with medications after hours? ---No PLEASE NOTE: All timestamps contained within this report are represented as [place] Standard Time. CONFIDENTIALTY NOTICE: This fax transmission is intended only for the addressee. It contains information that is legally privileged, confidential or otherwise protected from use or disclosure. If you are not the intended recipient, you are strictly prohibited from reviewing, disclosing, copying using or disseminating any of this information or taking any action in reliance on or regarding this information. If you have received this fax in error, please notify us immediately by telephone so that we can arrange for its return to [place]. Phone: [phone], Toll-Free: [phone], Fax: [phone] Page: 2 of 2 Call Id: [id number] Guidelines Guideline Title Affirmed Question Affirmed Notes Nurse Date/Time Eilene Ghazi Time) Infection on Antibiotic Follow-up Call [1] Caller has URGENT question AND [2] triager unable to answer question Cantrell, RN, Benjamine Mola 06/16/2022 8:23:41 AM Disp. Time Eilene Ghazi Time) Disposition Final User 06/16/2022 8:25:43 AM Go to ED Now (or PCP triage) Yes Cantrell, RN, Benjamine Mola Final Disposition 06/16/2022 8:25:43 AM Go to ED Now (or PCP triage) Yes Cantrell, RN, Benjamine Mola Disposition Overriden: Call PCP Now Override Reason: Patient's symptoms need a higher level of care Caller Disagree/Comply Disagree Caller Understands Yes PreDisposition InappropriateToAsk Care Advice Given Per Guideline GO TO ED NOW (OR PCP TRIAGE): * IF NO PCP (PRIMARY CARE PROVIDER) SECOND-LEVEL TRIAGE: You need to be seen within the next hour. Go to the [facility] at _____________ [place] as soon as you can. ANOTHER ADULT SHOULD DRIVE: * It is better and safer if another adult drives instead of  you. BRING MEDICINES: *  Bring a list of your current medicines when you go to the Emergency Department (ER). * Bring the pill bottles too. This will help the doctor (or NP/PA) to make certain you are taking the right medicines and the right dose. CARE ADVICE given per Infection on Antibiotics Follow-Up Call (Adult) guideline. Comments User: Sharol Given, RN Date/Time Eilene Ghazi Time): 06/16/2022 8:27:36 AM Caller states that he is not going to be seen. Advised risk of getting worse/possible death if delaying care. Verbalizes understanding. Referrals GO TO FACILITY REFUSE

## 2022-06-19 NOTE — Progress Notes (Signed)
Subjective:    Patient ID: [name], male    DOB: 25-Jan-1956, 66 y.o.   MRN: [id number]  HPI Here with wife due to allergic reaction to septra  Took med for 2-3 days Got very nauseated and started vomiting Then broke out in rash--generalized Felt so bad he actually passed out and hurt his left arm--just bandaged  Feeling weak Trouble eating --since the reaction Had sweats also  Still has the urinary symptoms  Current Outpatient Medications on File Prior to Visit  Medication Sig Dispense Refill   Alcohol Swabs (B-D SINGLE USE SWABS REGULAR) PADS USE 1 PREP PAD TWICE DAILY 200 each 3   Alpha-Lipoic Acid 600 MG TABS Take by mouth. 2 in am and 1 in pm     amLODipine (NORVASC) 5 MG tablet TAKE 1 TABLET(5 MG) BY MOUTH DAILY 90 tablet 3   aspirin 81 MG tablet Take 81 mg by mouth daily.     B-D UF III MINI PEN NEEDLES 31G X 5 MM MISC USE AS DIRECTED DAILY 90 each 3   CALCIUM-MAGNESIUM-ZINC PO Take by mouth.     Cinnamon 500 MG capsule Take 500 mg by mouth 2 (two) times daily.     DULoxetine (CYMBALTA) 30 MG capsule Take 1 capsule (30 mg total) by mouth daily. 30 capsule 3   furosemide (LASIX) 20 MG tablet Take 1 tablet (20 mg total) by mouth daily as needed. 90 tablet 3   gabapentin (NEURONTIN) 300 MG capsule Take 1-2 capsules (300-600 mg total) by mouth 3 (three) times daily. 180 capsule 3   glucose blood (CONTOUR NEXT TEST) test strip USE 1 TO 2 TIMES DAILY TO  CHECK BLOOD SUGAR 200 strip 3   hydrocortisone valerate ointment (WEST-CORT) 0.2 % APPLY TO AFFECTED AREA(S)  TOPICALLY TWICE DAILY 120 g 1   hydrOXYzine (VISTARIL) 25 MG capsule Take 1 capsule (25 mg total) by mouth every 8 (eight) hours as needed for itching. 60 capsule 0   indomethacin (INDOCIN) 25 MG capsule TAKE 1 CAPSULE BY MOUTH 3   TIMES A DAY AS NEEDED 270 capsule 3   Ixekizumab (TALTZ) 80 MG/ML SOAJ Inject 80 mg into the skin every 28 (twenty-eight) days. For maintenance. 1 mL 6   ketoconazole (NIZORAL) 2 %  shampoo Apply 1 Application topically as directed. Wash scalp with Ketoconazole shampoo qo shampoo, let sit for 5 minutes and rinse out 120 mL 11   LANTUS SOLOSTAR 100 UNIT/ML Solostar Pen INJECT SUBCUTANEOUSLY 22  UNITS DAILY 30 mL 3   losartan (COZAAR) 25 MG tablet TAKE 1 TABLET(25 MG) BY MOUTH DAILY 90 tablet 3   metFORMIN (GLUCOPHAGE) 1000 MG tablet TAKE 1 TABLET BY MOUTH TWICE  DAILY WITH MEALS 180 tablet 3   omeprazole (PRILOSEC) 20 MG capsule TAKE 1 CAPSULE BY MOUTH TWICE  DAILY BEFORE MEALS 180 capsule 3   simvastatin (ZOCOR) 40 MG tablet TAKE 1 TABLET BY MOUTH AT  BEDTIME 90 tablet 3   Tapinarof (VTAMA) 1 % CREA Apply 1 application topically daily. 60 g 2   Tapinarof (VTAMA) 1 % CREA Apply 1 Application topically daily. Qd to aa psoriasis on hands, feet, arms 60 g 11   triamcinolone lotion (KENALOG) 0.1 % APPLY TO AFFECTED AREA(S)  TOPICALLY 3 TIMES DAILY 180 mL 1   No current facility-administered medications on file prior to visit.    Allergies  Allergen Reactions   Glipizide Other (See Comments)    REACTION: wt. gain, hands tingling   Lisinopril  Other (See Comments)    HYPERKALEMIA   Ramipril Cough   Ibuprofen     UNSPECIFIED REACTION    Bactrim [Sulfamethoxazole-Trimethoprim] Nausea And Vomiting and Rash    Past Medical History:  Diagnosis Date   Adenomatous polyp    Allergy    Arthritis    feet    Bunion 04/02/2013   STATUS POST OP BUN REPAIR   Diabetes mellitus    GERD (gastroesophageal reflux disease)    Gout    Hammertoe    Hyperlipidemia    Hypertension    Neuromuscular disorder (HCC)    neuropathy   Plantar flexed metatarsal    Psoriasis     Past Surgical History:  Procedure Laterality Date   COLONOSCOPY     FOOT AMPUTATION Right 01/11/2017   Dr Doreatha Lew ([facility])--transmetatarsal   FOOT SURGERY Right 02/28/2013   BUNIION HAM TOE2,3 PINS ,2ND MET OSTEOTOMY, 5TH MET W/SCREW   02-2013, 04-2013   HIP FRACTURE SURGERY  1993   surgery x2   POLYPECTOMY      PROSTATE BIOPSY  10/10/2019   TOTAL HIP ARTHROPLASTY     Left hip replacement/femur fx 07/04, Screw removal left hip- [facility] 11/01    Family History  Problem Relation Age of Onset   Diabetes Mother    Diabetes Brother    Alzheimer's disease Maternal Aunt    Alzheimer's disease Cousin    Cancer Neg Hx    Colon cancer Neg Hx    Colon polyps Neg Hx    Rectal cancer Neg Hx    Stomach cancer Neg Hx     Social History   Socioeconomic History   Marital status: Married    Spouse name: Not on file   Number of children: 3   Years of education: Not on file   Highest education level: Not on file  Occupational History   Occupation: PC ADMINISTRATOR    Employer: [employer]  Tobacco Use   Smoking status: Never    Passive exposure: Past   Smokeless tobacco: Never  Substance and Sexual Activity   Alcohol use: Yes    Comment: occasional (DWI 1991)   Drug use: No   Sexual activity: Yes    Birth control/protection: None  Other Topics Concern   Not on file  Social History Narrative       Social Determinants of Health   Financial Resource Strain: Not on file  Food Insecurity: Not on file  Transportation Needs: Not on file  Physical Activity: Not on file  Stress: Not on file  Social Connections: Not on file  Intimate Partner Violence: Not on file   Review of Systems Doxycycline didn't really help the infection Did have a sense that cipro did work     Objective:   Physical Exam Constitutional:      Appearance: Normal appearance.  Skin:    Findings: No rash.  Neurological:     Mental Status: He is alert.            Assessment & Plan:

## 2022-06-19 NOTE — Assessment & Plan Note (Signed)
Put on his allergy list Will recheck labs

## 2022-06-20 ENCOUNTER — Other Ambulatory Visit: Payer: Self-pay | Admitting: Internal Medicine

## 2022-06-20 ENCOUNTER — Ambulatory Visit: Payer: 59 | Admitting: Internal Medicine

## 2022-06-20 DIAGNOSIS — E871 Hypo-osmolality and hyponatremia: Secondary | ICD-10-CM

## 2022-06-20 LAB — COMPREHENSIVE METABOLIC PANEL
ALT: 20 IU/L (ref 0–44)
AST: 35 IU/L (ref 0–40)
Albumin/Globulin Ratio: 2.4 — ABNORMAL HIGH (ref 1.2–2.2)
Albumin: 4.5 g/dL (ref 3.9–4.9)
Alkaline Phosphatase: 76 IU/L (ref 44–121)
BUN/Creatinine Ratio: 7 — ABNORMAL LOW (ref 10–24)
BUN: 8 mg/dL (ref 8–27)
Bilirubin Total: 0.5 mg/dL (ref 0.0–1.2)
CO2: 20 mmol/L (ref 20–29)
Calcium: 9 mg/dL (ref 8.6–10.2)
Chloride: 87 mmol/L — ABNORMAL LOW (ref 96–106)
Creatinine, Ser: 1.11 mg/dL (ref 0.76–1.27)
Globulin, Total: 1.9 g/dL (ref 1.5–4.5)
Glucose: 133 mg/dL — ABNORMAL HIGH (ref 70–99)
Potassium: 5.5 mmol/L — ABNORMAL HIGH (ref 3.5–5.2)
Sodium: 124 mmol/L — ABNORMAL LOW (ref 134–144)
Total Protein: 6.4 g/dL (ref 6.0–8.5)
eGFR: 73 mL/min/{1.73_m2} (ref >59)

## 2022-06-20 LAB — CBC
Hematocrit: 34.4 % — ABNORMAL LOW (ref 37.5–51.0)
Hemoglobin: 11.9 g/dL — ABNORMAL LOW (ref 13.0–17.7)
MCH: 33.1 pg — ABNORMAL HIGH (ref 26.6–33.0)
MCHC: 34.6 g/dL (ref 31.5–35.7)
MCV: 96 fL (ref 79–97)
Platelets: 284 10*3/uL (ref 150–450)
RBC: 3.6 x10E6/uL — ABNORMAL LOW (ref 4.14–5.80)
RDW: 11.8 % (ref 11.6–15.4)
WBC: 7 10*3/uL (ref 3.4–10.8)

## 2022-06-21 ENCOUNTER — Telehealth: Payer: Self-pay | Admitting: Internal Medicine

## 2022-06-21 ENCOUNTER — Other Ambulatory Visit: Payer: 59

## 2022-06-21 NOTE — Telephone Encounter (Signed)
I have changed his labs to [facility]. They should be able to see them.

## 2022-06-21 NOTE — Addendum Note (Signed)
Addended by: Pilar Grammes on: 06/21/2022 12:19 PM   Modules accepted: Orders

## 2022-06-21 NOTE — Telephone Encounter (Signed)
The patient has decided to go to the following location for their labs: [place] to be drawn (this info should already be in the appointment notes): urgent repeat labs per RL

## 2022-06-22 NOTE — Addendum Note (Signed)
Addended by: Pilar Grammes on: 06/22/2022 12:50 PM   Modules accepted: Orders

## 2022-06-22 NOTE — Addendum Note (Signed)
Addended by: Francella Solian on: 06/22/2022 11:37 AM   Modules accepted: Orders

## 2022-06-23 ENCOUNTER — Other Ambulatory Visit: Payer: Self-pay | Admitting: Internal Medicine

## 2022-06-26 NOTE — Progress Notes (Addendum)
06/27/2022 9:18 AM   [name] Feb 18, 1956 [id number]  Referring provider: Venia Carbon, MD [address] [place],  [place] [address]  Chief Complaint  Patient presents with   Follow-up   Prostatitis    HPI: 66 year old male who presents today for further evaluation of worsening urinary symptoms.  He was last seen in our office in 2021, previously followed by Dr. Junious Silk.  He has a personal history of elevated/fluctuating PSA.  He ultimately underwent prostate biopsy when his PSA peaked at 6.7.  Prostate biopsy was completed in 09/2019 at which time his prostate was noted to be 77 g with a nodule on the right on examination.  His PSA subsequently trended back downwards.  Most recent PSA stable at 5.9 on 11/2021.  He does have prostamegaly/BPH with some baseline urinary symptoms.  He returns today however with exacerbation of his urinary symptoms over the past approximately the last 3 months.    He initially was seen at urgent care [facility]/2022.  Urinalysis was negative but despite this, he was started on empiric Cipro bid x 10 days.  Urine culture was negative.  He saw Dr. Silvio Pate on 03/29/2022 in follow-up with urinary symptoms that included urinary hesitancy, some dysuria along with change in his urinary smell and some low back pain.  Also exam performed by PCP at the time was noted to be "moderate tenderness".  Urinalysis was not checked.  At the time he was diagnosed with acute prostatitis.  He did not have any fevers, chills, body aches or any other symptoms consistent with acute prostatitis.  No leukocytosis on CBC.  This time, he was prescribed doxycycline.    He returned to his PCP on 06/12/2022 concerned that he may have another prostate infection.  Per documentation, he felt like the Cipro doxycycline did not completely clear from of his symptoms and that he had a repeat exacerbation just prior to that visit.  Symptoms at the time included urinary  hesitancy, stopping and starting his stream is well as dysuria.  No other symptoms.  No other labs were performed including no urine again.  CBC again showed no leukocytosis.  This time he was prescribed Bactrim.  Unfortunately, he had a very severe reaction to this in the form of nausea vomiting as well as generalized rash.   He was seen by his PCP on 06/19/2022 after having this reaction.  Again he was diagnosed with "acute prostatitis at this time given Cipro.  He continues to have waxing waning symptoms as outlined in his IPSS as below.  He is not having any overt dysuria.  He has never had fevers or chills.  He is never had low blood pressure or need for hospital admission.  He has had some variability with his blood sugar which the family attributes to these ongoing "infections".  They are also concerned today that he has been feeling just awful/lethargic over the past week or so.  He is will turn a corner over the past few days.  There are certain that this is related to his urinary issues.  They report that he did improve on Cipro but documentation does not support that.  He is a diabetic.  Results for orders placed or performed in visit on 06/27/22  Microscopic Examination   Urine  Result Value Ref Range   WBC, UA 0-5 0 - 5 /hpf   RBC, Urine 0-2 0 - 2 /hpf   Epithelial Cells (non  renal) 0-10 0 - 10 /hpf   Bacteria, UA None seen None seen/Few  Urinalysis, Complete  Result Value Ref Range   Specific Gravity, UA 1.010 1.005 - 1.030   pH, UA 6.0 5.0 - 7.5   Color, UA Yellow Yellow   Appearance Ur Clear Clear   Leukocytes,UA Negative Negative   Protein,UA Trace (A) Negative/Trace   Glucose, UA 2+ (A) Negative   Ketones, UA Trace (A) Negative   RBC, UA Negative Negative   Bilirubin, UA Negative Negative   Urobilinogen, Ur 0.2 0.2 - 1.0 mg/dL   Nitrite, UA Negative Negative   Microscopic Examination See below:   BLADDER SCAN AMB NON-IMAGING  Result Value Ref Range   Scan Result  244m       IPSS     Row Name 06/27/22 1500         International Prostate Symptom Score   How often have you had the sensation of not emptying your bladder? More than half the time     How often have you had to urinate less than every two hours? More than half the time     How often have you found you stopped and started again several times when you urinated? More than half the time     How often have you had a weak urinary stream? More than half the time     How often have you had to strain to start urination? Less than half the time     How many times did you typically get up at night to urinate? 4 Times     Total IPSS Score 22       Quality of Life due to urinary symptoms   If you were to spend the rest of your life with your urinary condition just the way it is now how would you feel about that? Unhappy              Score:  1-7 Mild 8-19 Moderate 20-35 Severe     PMH: Past Medical History:  Diagnosis Date   Adenomatous polyp    Allergy    Arthritis    feet    Bunion 04/02/2013   STATUS POST OP BUN REPAIR   Diabetes mellitus    GERD (gastroesophageal reflux disease)    Gout    [place]ammertoe    Hyperlipidemia    Hypertension    Neuromuscular disorder ([place]    neuropathy   Plantar flexed metatarsal    Psoriasis     Surgical History: Past Surgical History:  Procedure Laterality Date   COLONOSCOPY     FOOT AMPUTATION Right 01/11/2017   Dr TDoreatha Lew([facility])--transmetatarsal   FOOT SURGERY Right 02/28/2013   BShaune LeeksHAM TOE2,3 PINS ,2ND MET OSTEOTOMY, 5TH MET W/SCREW   02-2013, 04-2013   HIP FRACTURE SURGERY  1993   surgery x2   POLYPECTOMY     PROSTATE BIOPSY  10/10/2019   TOTAL HIP ARTHROPLASTY     Left hip replacement/femur fx 07/04, Screw removal left hip- [facility] 11/01    Home Medications:  Allergies as of 06/27/2022       Reactions   Glipizide Other (See Comments)   REACTION: wt. gain, hands tingling   Lisinopril Other (See Comments)    HYPERKALEMIA   Ramipril Cough   Ibuprofen    UNSPECIFIED REACTION    Bactrim [sulfamethoxazole-trimethoprim] Nausea And Vomiting, Rash        Medication List        Accurate as  of June 27, 2022 11:59 PM. If you have any questions, ask your nurse or doctor.          STOP taking these medications    ciprofloxacin 500 MG tablet Commonly known as: CIPRO Stopped by: Hollice Espy, MD   gabapentin 300 MG capsule Commonly known as: NEURONTIN Stopped by: Hollice Espy, MD       TAKE these medications    Alpha-Lipoic Acid 600 MG Tabs Take by mouth. 2 in am and 1 in pm   amLODipine 5 MG tablet Commonly known as: NORVASC TAKE 1 TABLET(5 MG) BY MOUTH DAILY   aspirin 81 MG tablet Take 81 mg by mouth daily.   B-D SINGLE USE SWABS REGULAR Pads USE 1 PREP PAD TWICE DAILY   B-D UF III MINI PEN NEEDLES 31G X 5 MM Misc Generic drug: Insulin Pen Needle USE AS DIRECTED DAILY   CALCIUM-MAGNESIUM-ZINC PO Take by mouth.   Cinnamon 500 MG capsule Take 500 mg by mouth 2 (two) times daily.   Contour Next Test test strip Generic drug: glucose blood USE 1 TO 2 TIMES DAILY TO  CHECK BLOOD SUGAR   DULoxetine 30 MG capsule Commonly known as: CYMBALTA Take 1 capsule (30 mg total) by mouth daily.   furosemide 20 MG tablet Commonly known as: LASIX Take 1 tablet (20 mg total) by mouth daily as needed.   hydrocortisone valerate ointment 0.2 % Commonly known as: WEST-CORT APPLY TO AFFECTED AREA(S)  TOPICALLY TWICE DAILY   hydrOXYzine 25 MG capsule Commonly known as: VISTARIL Take 1 capsule (25 mg total) by mouth every 8 (eight) hours as needed for itching.   indomethacin 25 MG capsule Commonly known as: INDOCIN TAKE 1 CAPSULE BY MOUTH 3   TIMES A DAY AS NEEDED   ketoconazole 2 % shampoo Commonly known as: NIZORAL Apply 1 Application topically as directed. Wash scalp with Ketoconazole shampoo qo shampoo, let sit for 5 minutes and rinse out   Lantus SoloStar 100  UNIT/ML Solostar Pen Generic drug: insulin glargine INJECT SUBCUTANEOUSLY 22  UNITS DAILY   losartan 25 MG tablet Commonly known as: COZAAR TAKE 1 TABLET(25 MG) BY MOUTH DAILY   metFORMIN 1000 MG tablet Commonly known as: GLUCOPHAGE TAKE 1 TABLET BY MOUTH TWICE  DAILY WITH MEALS   omeprazole 20 MG capsule Commonly known as: PRILOSEC TAKE 1 CAPSULE BY MOUTH TWICE  DAILY BEFORE MEALS   simvastatin 40 MG tablet Commonly known as: ZOCOR TAKE 1 TABLET BY MOUTH AT  BEDTIME   Taltz 80 MG/ML Soaj Generic drug: Ixekizumab Inject 80 mg into the skin every 28 (twenty-eight) days. For maintenance.   tamsulosin 0.4 MG Caps capsule Commonly known as: FLOMAX Take 1 capsule (0.4 mg total) by mouth daily. Started by: Hollice Espy, MD   triamcinolone lotion 0.1 % Commonly known as: KENALOG APPLY TO AFFECTED AREA(S)  TOPICALLY 3 TIMES DAILY   Vtama 1 % Crea Generic drug: Tapinarof Apply 1 application topically daily.   Vtama 1 % Crea Generic drug: Tapinarof Apply 1 Application topically daily. Qd to aa psoriasis on hands, feet, arms        Allergies:  Allergies  Allergen Reactions   Glipizide Other (See Comments)    REACTION: wt. gain, hands tingling   Lisinopril Other (See Comments)    HYPERKALEMIA   Ramipril Cough   Ibuprofen     UNSPECIFIED REACTION    Bactrim [Sulfamethoxazole-Trimethoprim] Nausea And Vomiting and Rash    Family History: Family History  Problem Relation  Age of Onset   Diabetes Mother    Diabetes Brother    Alzheimer's disease Maternal Aunt    Alzheimer's disease Cousin    Cancer Neg Hx    Colon cancer Neg Hx    Colon polyps Neg Hx    Rectal cancer Neg Hx    Stomach cancer Neg Hx     Social History:  reports that he has never smoked. He has been exposed to tobacco smoke. He has never used smokeless tobacco. He reports current alcohol use. He reports that he does not use drugs.   Physical Exam: BP 132/77   Pulse (!) 103   Ht 6' (1.829 m)    Wt 211 lb (95.7 kg)   BMI 28.62 kg/m   Constitutional:  Alert and oriented, No acute distress.  Accompanied by his wife today. HEENT: [place] AT, moist mucus membranes.  Trachea midline, no masses. Cardiovascular: No clubbing, cyanosis, or edema. Respiratory: Normal respiratory effort, no increased work of breathing. GI: Abdomen is soft, nontender, nondistended, no abdominal masses GU: No CVA tenderness Skin: No rashes, bruises or suspicious lesions. Neurologic: Grossly intact, no focal deficits, moving all 4 extremities. Psychiatric: Normal mood and affect.  Laboratory Data: Lab Results  Component Value Date   WBC 6.6 06/28/2022   HGB 12.0 (L) 06/28/2022   HCT 34.6 (L) 06/28/2022   MCV 97 06/28/2022   PLT 420 06/28/2022    Lab Results  Component Value Date   CREATININE 1.04 06/28/2022     Lab Results  Component Value Date   HGBA1C 7.6 (H) 12/14/2021    Urinalysis Lab Results  Component Value Date   LABMICR See below: 06/27/2022   WBCUA 0-5 06/27/2022   LABEPIT 0-10 06/27/2022   BACTERIA None seen 06/27/2022    Pertinent Imaging: Results for orders placed or performed in visit on 06/27/22  Microscopic Examination   Urine  Result Value Ref Range   WBC, UA 0-5 0 - 5 /hpf   RBC, Urine 0-2 0 - 2 /hpf   Epithelial Cells (non renal) 0-10 0 - 10 /hpf   Bacteria, UA None seen None seen/Few  Urinalysis, Complete  Result Value Ref Range   Specific Gravity, UA 1.010 1.005 - 1.030   pH, UA 6.0 5.0 - 7.5   Color, UA Yellow Yellow   Appearance Ur Clear Clear   Leukocytes,UA Negative Negative   Protein,UA Trace (A) Negative/Trace   Glucose, UA 2+ (A) Negative   Ketones, UA Trace (A) Negative   RBC, UA Negative Negative   Bilirubin, UA Negative Negative   Urobilinogen, Ur 0.2 0.2 - 1.0 mg/dL   Nitrite, UA Negative Negative   Microscopic Examination See below:   BLADDER SCAN AMB NON-IMAGING  Result Value Ref Range   Scan Result 228m      Assessment & Plan:     1. Chronic prostatitis We had a lengthy discussion today about his underlying diagnosis which I suspect was never acute prostatitis based on the absence of urinary tract infection i.e. negative urinalyses, more insidious urinary symptoms rather than systemic infection and absence of leukocytosis, etc. etiology of his fatigue is unclear, there is no evidence that he has a urinary tract infection today.  We discussed that this is a nonspecific symptom.    I have strongly recommended against the use of antibiotics and this is systemic or urinary tract infection is in fact documented.  We discussed Flomax initiation along with NSAID use to help with prostatic inflammation that is  a contributing factor.  Flomax will also address his BPH.  We discussed possible side effects of this medication including retrograde ejaculation and orthostatic hypotension as possible side effects.  We discussed that despite his sulfa allergy, he will likely be able to tolerate Flomax also is very little cross-reactivity and the sulfa molecules. - Urinalysis, Complete - BLADDER SCAN AMB NON-IMAGING  2. Benign prostatic hyperplasia with incomplete bladder emptying As above  We also discussed considering initiation of finasteride and addition of Flomax.  We discussed possible side effects.  He would like to try dual pharmacotherapy to optimize his outcome.  We did also briefly discuss alternatives to pharmacotherapy for this which include consideration of outlet procedure  They are not interested in this at this time we will further this discussion at subsequent visits.  We did talk more about your left, his wife had a lot of questions as well.  It is unclear whether or not he be a candidate based on his prostate volume as I suspect has had interval growth since the time of his biopsy.  He would need a cystoscopy and TRUS.  3. Elevated PSA Stably elevated PSA status post negative biopsy in the past  Will continue to  monitor this with serial lab draws    Return in about 6 months (around 12/27/2022) for IPSS/PVR.  Or sooner if symptoms fail to improve or worsen  Hollice Espy, MD  [facility] [address] [place], [place] [address] [phone]  I spent 46 total minutes on the day of the encounter including pre-visit review of the medical record, face-to-face time with the patient, and post visit ordering of labs/imaging/tests.

## 2022-06-27 ENCOUNTER — Ambulatory Visit: Payer: 59 | Admitting: Urology

## 2022-06-27 ENCOUNTER — Encounter: Payer: Self-pay | Admitting: Urology

## 2022-06-27 VITALS — BP 132/77 | HR 103 | Ht 72.0 in | Wt 211.0 lb

## 2022-06-27 DIAGNOSIS — N411 Chronic prostatitis: Secondary | ICD-10-CM | POA: Diagnosis not present

## 2022-06-27 DIAGNOSIS — R972 Elevated prostate specific antigen [PSA]: Secondary | ICD-10-CM

## 2022-06-27 DIAGNOSIS — N401 Enlarged prostate with lower urinary tract symptoms: Secondary | ICD-10-CM | POA: Diagnosis not present

## 2022-06-27 DIAGNOSIS — R339 Retention of urine, unspecified: Secondary | ICD-10-CM | POA: Diagnosis not present

## 2022-06-27 DIAGNOSIS — N410 Acute prostatitis: Secondary | ICD-10-CM

## 2022-06-27 LAB — BLADDER SCAN AMB NON-IMAGING

## 2022-06-28 ENCOUNTER — Telehealth: Payer: Self-pay

## 2022-06-28 ENCOUNTER — Other Ambulatory Visit (INDEPENDENT_AMBULATORY_CARE_PROVIDER_SITE_OTHER): Payer: 59

## 2022-06-28 DIAGNOSIS — D649 Anemia, unspecified: Secondary | ICD-10-CM

## 2022-06-28 DIAGNOSIS — E871 Hypo-osmolality and hyponatremia: Secondary | ICD-10-CM

## 2022-06-28 LAB — URINALYSIS, COMPLETE
Bilirubin, UA: NEGATIVE
Leukocytes,UA: NEGATIVE
Nitrite, UA: NEGATIVE
RBC, UA: NEGATIVE
Specific Gravity, UA: 1.01 (ref 1.005–1.030)
Urobilinogen, Ur: 0.2 mg/dL (ref 0.2–1.0)
pH, UA: 6 (ref 5.0–7.5)

## 2022-06-28 LAB — RENAL FUNCTION PANEL
Albumin: 4.5 g/dL (ref 3.9–4.9)
BUN/Creatinine Ratio: 8 — ABNORMAL LOW (ref 10–24)
BUN: 10 mg/dL (ref 8–27)
CO2: 22 mmol/L (ref 20–29)
Calcium: 9.5 mg/dL (ref 8.6–10.2)
Chloride: 86 mmol/L — ABNORMAL LOW (ref 96–106)
Creatinine, Ser: 1.26 mg/dL (ref 0.76–1.27)
Glucose: 163 mg/dL — ABNORMAL HIGH (ref 70–99)
Phosphorus: 4 mg/dL (ref 2.8–4.1)
Potassium: 4.6 mmol/L (ref 3.5–5.2)
Sodium: 127 mmol/L — ABNORMAL LOW (ref 134–144)
eGFR: 63 mL/min/{1.73_m2} (ref >59)

## 2022-06-28 LAB — CORTISOL-AM, BLOOD: Cortisol - AM: 11.4 ug/dL (ref 6.2–19.4)

## 2022-06-28 LAB — MICROSCOPIC EXAMINATION: Bacteria, UA: NONE SEEN

## 2022-06-28 NOTE — Telephone Encounter (Signed)
Pt came in for labs this morning. He said he saw urology and they said he did not have an infection. Pt is requesting a repeat CBC and and Iron panels since he is anemic. I advised him to let the person who did his blood that he needed a CBS so she could draw for it. Needs orders.

## 2022-06-28 NOTE — Addendum Note (Signed)
Addended by: Viviana Simpler I on: 06/28/2022 08:42 AM   Modules accepted: Orders

## 2022-06-29 ENCOUNTER — Other Ambulatory Visit: Payer: Self-pay | Admitting: Internal Medicine

## 2022-06-29 DIAGNOSIS — D649 Anemia, unspecified: Secondary | ICD-10-CM

## 2022-06-29 LAB — CBC
Hematocrit: 34.6 % — ABNORMAL LOW (ref 37.5–51.0)
Hemoglobin: 12 g/dL — ABNORMAL LOW (ref 13.0–17.7)
MCH: 33.7 pg — ABNORMAL HIGH (ref 26.6–33.0)
MCHC: 34.7 g/dL (ref 31.5–35.7)
MCV: 97 fL (ref 79–97)
Platelets: 420 10*3/uL (ref 150–450)
RBC: 3.56 x10E6/uL — ABNORMAL LOW (ref 4.14–5.80)
RDW: 11.8 % (ref 11.6–15.4)
WBC: 6.6 10*3/uL (ref 3.4–10.8)

## 2022-06-29 LAB — RENAL FUNCTION PANEL
Albumin: 4.3 g/dL (ref 3.9–4.9)
BUN/Creatinine Ratio: 12 (ref 10–24)
BUN: 12 mg/dL (ref 8–27)
CO2: 25 mmol/L (ref 20–29)
Calcium: 9.6 mg/dL (ref 8.6–10.2)
Chloride: 93 mmol/L — ABNORMAL LOW (ref 96–106)
Creatinine, Ser: 1.04 mg/dL (ref 0.76–1.27)
Glucose: 298 mg/dL — ABNORMAL HIGH (ref 70–99)
Phosphorus: 3.5 mg/dL (ref 2.8–4.1)
Potassium: 4.3 mmol/L (ref 3.5–5.2)
Sodium: 134 mmol/L (ref 134–144)
eGFR: 79 mL/min/{1.73_m2} (ref >59)

## 2022-06-29 LAB — IRON,TIBC AND FERRITIN PANEL
Ferritin: 143 ng/mL (ref 30–400)
Iron Saturation: 31 % (ref 15–55)
Iron: 98 ug/dL (ref 38–169)
Total Iron Binding Capacity: 312 ug/dL (ref 250–450)
UIBC: 214 ug/dL (ref 111–343)

## 2022-06-30 ENCOUNTER — Encounter: Payer: Self-pay | Admitting: Urology

## 2022-06-30 MED ORDER — TAMSULOSIN HCL 0.4 MG PO CAPS: 0.4000 mg | ORAL_CAPSULE | Freq: Every day | ORAL | 11 refills | Status: DC

## 2022-06-30 MED ORDER — FINASTERIDE 5 MG PO TABS: 5.0000 mg | ORAL_TABLET | Freq: Every day | ORAL | 11 refills | Status: DC

## 2022-06-30 NOTE — Addendum Note (Signed)
Addended by: Hollice Espy on: 06/30/2022 09:27 AM   Modules accepted: Orders

## 2022-07-03 ENCOUNTER — Encounter: Payer: Self-pay | Admitting: Internal Medicine

## 2022-07-04 ENCOUNTER — Ambulatory Visit (AMBULATORY_SURGERY_CENTER): Payer: 59

## 2022-07-04 VITALS — Ht 72.0 in | Wt 212.0 lb

## 2022-07-04 DIAGNOSIS — Z86010 Personal history of colonic polyps: Secondary | ICD-10-CM

## 2022-07-04 MED ORDER — NA SULFATE-K SULFATE-MG SULF 17.5-3.13-1.6 GM/177ML PO SOLN: 1.0000 | Freq: Once | ORAL | 0 refills | Status: AC

## 2022-07-04 NOTE — Progress Notes (Signed)

## 2022-07-05 ENCOUNTER — Inpatient Hospital Stay: Payer: 59 | Attending: Internal Medicine | Admitting: Internal Medicine

## 2022-07-05 ENCOUNTER — Encounter: Payer: Self-pay | Admitting: Internal Medicine

## 2022-07-05 ENCOUNTER — Inpatient Hospital Stay: Payer: 59

## 2022-07-05 DIAGNOSIS — R5383 Other fatigue: Secondary | ICD-10-CM | POA: Diagnosis not present

## 2022-07-05 DIAGNOSIS — K5900 Constipation, unspecified: Secondary | ICD-10-CM | POA: Diagnosis not present

## 2022-07-05 DIAGNOSIS — E119 Type 2 diabetes mellitus without complications: Secondary | ICD-10-CM | POA: Insufficient documentation

## 2022-07-05 DIAGNOSIS — Z881 Allergy status to other antibiotic agents status: Secondary | ICD-10-CM | POA: Insufficient documentation

## 2022-07-05 DIAGNOSIS — Z833 Family history of diabetes mellitus: Secondary | ICD-10-CM | POA: Insufficient documentation

## 2022-07-05 DIAGNOSIS — Z886 Allergy status to analgesic agent status: Secondary | ICD-10-CM | POA: Insufficient documentation

## 2022-07-05 DIAGNOSIS — Z818 Family history of other mental and behavioral disorders: Secondary | ICD-10-CM | POA: Diagnosis not present

## 2022-07-05 DIAGNOSIS — K219 Gastro-esophageal reflux disease without esophagitis: Secondary | ICD-10-CM | POA: Diagnosis not present

## 2022-07-05 DIAGNOSIS — Z79899 Other long term (current) drug therapy: Secondary | ICD-10-CM | POA: Diagnosis not present

## 2022-07-05 DIAGNOSIS — L409 Psoriasis, unspecified: Secondary | ICD-10-CM | POA: Diagnosis not present

## 2022-07-05 DIAGNOSIS — Z888 Allergy status to other drugs, medicaments and biological substances status: Secondary | ICD-10-CM | POA: Diagnosis not present

## 2022-07-05 DIAGNOSIS — D649 Anemia, unspecified: Secondary | ICD-10-CM | POA: Insufficient documentation

## 2022-07-05 NOTE — Progress Notes (Signed)
Patient here today for initial evaluation regarding anemia. Patient reports fatigue. Patient will need labs drawn at [facility] if needed.

## 2022-07-05 NOTE — Assessment & Plan Note (Addendum)
#  Anemia- mild Hb 12; normocytic [since MAY 2023]-etiology is unclear.I had a long discussion with the patient regarding etiology of unexplained  anemia.DEC 2023-  Iron studies; ferritin no evidence of iron deficiency.   # Discussed that the etiologies include -benign like nutritional/malabsorption, liver disease, chronic kidney disease viral infections, autoimmune; blood loss etc.  Low clinical concern for any malignant causes.   # I recommend CBC CMP LDH haptoglobin M35 folic acid reticulocyte count ;  review of peripheral smear.  myeloma panel kappa lambda light chain ratio. CRP   If no obvious cause noted would recommend further work-up including a bone marrow biopsy.  For now hold off bone marrow biopsy/imaging at this time.  # # Urinary Issues- ? Chronic prostatitis [Dr.Brandon]-no evidence of blood in urine.  On Flomax plus finasteride.  # Psoriasis- on TNF blocker-TALTZ-stable/improved.  Thank you Dr. Silvio Pate for allowing me to participate in the care of your pleasant patient. Please do not hesitate to contact me with questions or concerns in the interim.  # DISPOSITION: # NO labs today # labs corp requistion  # follow up in weeks- MD; No labs- Dr.B

## 2022-07-05 NOTE — Progress Notes (Signed)
[name] CONSULT NOTE  Patient Care Team: Venia Carbon, MD as PCP - General  CHIEF COMPLAINTS/PURPOSE OF CONSULTATION: ANEMIA   HEMATOLOGY HISTORY  # SEP 2023- ANEMIA MCV-platelets WBC-WNL; Iron sat; ferritin;  GFR- CT/US; EGD- in [facility] 2024 /colonoscopy-dec 21st, 2023; prior 3-4 year [Dr.[facility]]   Latest Reference Range & Units Most Recent 12/14/21 10:08 03/29/22 13:12 06/19/22 15:18 06/28/22 11:16  Hemoglobin 13.0 - 17.7 g/dL 12.0 (L) 06/28/22 11:16 13.4 12.2 (L) 11.9 (L) 12.0 (L)  (L): Data is abnormally low  Latest Reference Range & Units 06/28/22 11:16  Iron 38 - 169 ug/dL 98  UIBC 111 - 343 ug/dL 214  TIBC 250 - 450 ug/dL 312  Ferritin 30 - 400 ng/mL 143  Iron Saturation 15 - 55 % 31    # Psoraiss [Dx- 90 years]- 2023- started TNF   HISTORY OF PRESENTING ILLNESS:   [name] 66 y.o.  male pleasant patient was been referred to [place] for further evaluation of anemia.  Patient here today for initial evaluation regarding anemia. Patient reports fatigue.   Patient admits to workup for increased frequency of urination s/p evaluation with urology. Currently on alpha blockers- improved.   Blood in stools: none Blood in urine: none Difficulty swallowing: Change of bowel movement/constipation:mild constipation- cymbalta Prior blood transfusion:  none Prior history of blood loss: none Liver disease: none Alcohol: 2-3 times a week/liquor Bariatric surgery: none   Prior evaluation with hematology: noone  Prior bone marrow biopsy: noone  Oral iron: 2 months ago- started self- stopped  Prior IV iron infusions: none    Review of Systems  Constitutional:  Positive for malaise/fatigue. Negative for chills, diaphoresis, fever and weight loss.  HENT:  Negative for nosebleeds and sore throat.   Eyes:  Negative for double vision.  Respiratory:  Negative for cough, hemoptysis, sputum production, shortness of breath and wheezing.   Cardiovascular:   Negative for chest pain, palpitations, orthopnea and leg swelling.  Gastrointestinal:  Negative for abdominal pain, blood in stool, constipation, diarrhea, heartburn, melena, nausea and vomiting.  Genitourinary:  Positive for frequency. Negative for dysuria and urgency.  Musculoskeletal:  Negative for back pain and joint pain.  Skin: Negative.  Negative for itching and rash.  Neurological:  Negative for dizziness, tingling, focal weakness, weakness and headaches.  Endo/Heme/Allergies:  Does not bruise/bleed easily.  Psychiatric/Behavioral:  Negative for depression. The patient is not nervous/anxious and does not have insomnia.      MEDICAL HISTORY:  Past Medical History:  Diagnosis Date   Adenomatous polyp    Allergy    Arthritis    feet    Bunion 04/02/2013   STATUS POST OP BUN REPAIR   Diabetes mellitus    GERD (gastroesophageal reflux disease)    Gout    Hammertoe    Hyperlipidemia    Hypertension    Neuromuscular disorder ([place])    neuropathy   Plantar flexed metatarsal    Psoriasis     SURGICAL HISTORY: Past Surgical History:  Procedure Laterality Date   COLONOSCOPY     FOOT AMPUTATION Right 01/11/2017   Dr Doreatha Lew ([facility])--transmetatarsal   FOOT SURGERY Right 02/28/2013   Shaune Leeks HAM TOE2,3 PINS ,2ND MET OSTEOTOMY, 5TH MET W/SCREW   02-2013, 04-2013   HIP FRACTURE SURGERY  1993   surgery x2   POLYPECTOMY     PROSTATE BIOPSY  10/10/2019   TOTAL HIP ARTHROPLASTY     Left hip replacement/femur fx 07/04, Screw removal  left hip- Hines 11/01    SOCIAL HISTORY: Social History   Socioeconomic History   Marital status: Married    Spouse name: Not on file   Number of children: 3   Years of education: Not on file   Highest education level: Not on file  Occupational History   Occupation: PC ADMINISTRATOR    Employer: [employer]  Tobacco Use   Smoking status: Never    Passive exposure: Past   Smokeless tobacco: Never  Substance and Sexual Activity   Alcohol use: Yes     Comment: occasional (DWI 1991)   Drug use: No   Sexual activity: Yes    Birth control/protection: None  Other Topics Concern   Not on file  Social History Narrative    Works for [employer]; alcohol 2-3 times a week; never smoked; lives in [place]. With wife; and 2 dog.     Social Determinants of Health   Financial Resource Strain: Not on file  Food Insecurity: Not on file  Transportation Needs: Not on file  Physical Activity: Not on file  Stress: Not on file  Social Connections: Not on file  Intimate Partner Violence: Not on file    FAMILY HISTORY: Family History  Problem Relation Age of Onset   Diabetes Mother    Diabetes Brother    Alzheimer's disease Maternal Aunt    Alzheimer's disease Cousin    Cancer Neg Hx    Colon cancer Neg Hx    Colon polyps Neg Hx    Rectal cancer Neg Hx    Stomach cancer Neg Hx    Esophageal cancer Neg Hx     ALLERGIES:  is allergic to glipizide, lisinopril, ramipril, ibuprofen, and bactrim [sulfamethoxazole-trimethoprim].  MEDICATIONS:  Current Outpatient Medications  Medication Sig Dispense Refill   Alpha-Lipoic Acid 600 MG TABS Take by mouth. 2 in am and 1 in pm     amLODipine (NORVASC) 5 MG tablet TAKE 1 TABLET(5 MG) BY MOUTH DAILY 90 tablet 3   aspirin 81 MG tablet Take 81 mg by mouth daily.     B-D UF III MINI PEN NEEDLES 31G X 5 MM MISC USE AS DIRECTED DAILY 90 each 3   CALCIUM-MAGNESIUM-ZINC PO Take by mouth.     Cinnamon 500 MG capsule Take 500 mg by mouth 2 (two) times daily.     DULoxetine (CYMBALTA) 30 MG capsule Take 1 capsule (30 mg total) by mouth daily. 90 capsule 3   finasteride (PROSCAR) 5 MG tablet Take 1 tablet (5 mg total) by mouth daily. 30 tablet 11   furosemide (LASIX) 20 MG tablet Take 1 tablet (20 mg total) by mouth daily as needed. 90 tablet 3   glucose blood (CONTOUR NEXT TEST) test strip USE 1 TO 2 TIMES DAILY TO  CHECK BLOOD SUGAR 200 strip 3   hydrocortisone valerate ointment (WEST-CORT) 0.2 %  APPLY TO AFFECTED AREA(S)  TOPICALLY TWICE DAILY 120 g 1   hydrOXYzine (VISTARIL) 25 MG capsule Take 1 capsule (25 mg total) by mouth every 8 (eight) hours as needed for itching. 60 capsule 0   indomethacin (INDOCIN) 25 MG capsule TAKE 1 CAPSULE BY MOUTH 3   TIMES A DAY AS NEEDED 270 capsule 3   Ixekizumab (TALTZ) 80 MG/ML SOAJ Inject 80 mg into the skin every 28 (twenty-eight) days. For maintenance. 1 mL 6   LANTUS SOLOSTAR 100 UNIT/ML Solostar Pen INJECT SUBCUTANEOUSLY 22  UNITS DAILY 30 mL 3   losartan (COZAAR) 25 MG tablet  TAKE 1 TABLET(25 MG) BY MOUTH DAILY 90 tablet 3   metFORMIN (GLUCOPHAGE) 1000 MG tablet TAKE 1 TABLET BY MOUTH TWICE  DAILY WITH MEALS 180 tablet 3   omeprazole (PRILOSEC) 20 MG capsule TAKE 1 CAPSULE BY MOUTH TWICE  DAILY BEFORE MEALS 180 capsule 3   simvastatin (ZOCOR) 40 MG tablet TAKE 1 TABLET BY MOUTH AT  BEDTIME 90 tablet 3   tamsulosin (FLOMAX) 0.4 MG CAPS capsule Take 1 capsule (0.4 mg total) by mouth daily. 30 capsule 11   Tapinarof (VTAMA) 1 % CREA Apply 1 application topically daily. 60 g 2   Tapinarof (VTAMA) 1 % CREA Apply 1 Application topically daily. Qd to aa psoriasis on hands, feet, arms 60 g 11   triamcinolone lotion (KENALOG) 0.1 % APPLY TO AFFECTED AREA(S)  TOPICALLY 3 TIMES DAILY 180 mL 1   ketoconazole (NIZORAL) 2 % shampoo Apply 1 Application topically as directed. Wash scalp with Ketoconazole shampoo qo shampoo, let sit for 5 minutes and rinse out (Patient not taking: Reported on 07/04/2022) 120 mL 11   No current facility-administered medications for this visit.     Marland Kitchen  PHYSICAL EXAMINATION:   Vitals:   07/05/22 1124 07/05/22 1126  BP:  127/74  Pulse:  (!) 101  Resp: 18   Temp: 97.7 F (36.5 C)    Filed Weights   07/05/22 1124  Weight: 214 lb 6.4 oz (97.3 kg)    Physical Exam Vitals and nursing note reviewed.  HENT:     Head: Normocephalic and atraumatic.     Mouth/Throat:     Pharynx: Oropharynx is clear.  Eyes:      Extraocular Movements: Extraocular movements intact.     Pupils: Pupils are equal, round, and reactive to light.  Cardiovascular:     Rate and Rhythm: Normal rate and regular rhythm.  Abdominal:     Palpations: Abdomen is soft.  Musculoskeletal:        General: Normal range of motion.     Cervical back: Normal range of motion.  Skin:    General: Skin is warm.  Neurological:     General: No focal deficit present.     Mental Status: He is alert and oriented to person, place, and time.  Psychiatric:        Behavior: Behavior normal.        Judgment: Judgment normal.      LABORATORY DATA:  I have reviewed the data as listed Lab Results  Component Value Date   WBC 6.6 06/28/2022   HGB 12.0 (L) 06/28/2022   HCT 34.6 (L) 06/28/2022   MCV 97 06/28/2022   PLT 420 06/28/2022   Recent Labs    12/14/21 1008 03/29/22 1312 06/19/22 1518 06/23/22 0954 06/28/22 1116  NA 137 136 124* 127* 134  K 5.3* 5.0 5.5* 4.6 4.3  CL 95* 95* 87* 86* 93*  CO2 26 19* _0 GLUCOSE 162* 159* 133* 163* 298*  BUN 6* _1 CREATININE 1.06 1.07 1.11 1.26 1.04  CALCIUM 9.3 9.5 9.0 9.5 9.6  PROT 6.9 6.4 6.4  --   --   ALBUMIN 4.4 4.4 4.5 4.5 4.3  AST 33 33 35  --   --   ALT _2 --   --   ALKPHOS 62 92 76  --   --   BILITOT 0.5 0.4 0.5  --   --      No results found.  ASSESSMENT &  PLAN:   Symptomatic anemia # Anemia- mild Hb 12; normocytic [since MAY 2023]-etiology is unclear.I had a long discussion with the patient regarding etiology of unexplained  anemia.DEC 2023-  Iron studies; ferritin no evidence of iron deficiency.   # Discussed that the etiologies include -benign like nutritional/malabsorption, liver disease, chronic kidney disease viral infections, autoimmune; blood loss etc.  Low clinical concern for any malignant causes.   # I recommend CBC CMP LDH haptoglobin X54 folic acid reticulocyte count ;  review of peripheral smear.  myeloma panel kappa lambda light chain  ratio. CRP   If no obvious cause noted would recommend further work-up including a bone marrow biopsy.  For now hold off bone marrow biopsy/imaging at this time.  # # Urinary Issues- ? Chronic prostatitis [Dr.Brandon]-no evidence of blood in urine.  On Flomax plus finasteride.  # Psoriasis- on TNF blocker-TALTZ-stable/improved.  Thank you Dr. Silvio Pate for allowing me to participate in the care of your pleasant patient. Please do not hesitate to contact me with questions or concerns in the interim.  # DISPOSITION: # NO labs today # labs corp requistion  # follow up in weeks- MD; No labs- Dr.B    All questions were answered. The patient knows to call the clinic with any problems, questions or concerns.  Cammie Sickle, MD 07/05/2022 12:38 PM

## 2022-07-09 ENCOUNTER — Other Ambulatory Visit: Payer: Self-pay | Admitting: Internal Medicine

## 2022-07-11 ENCOUNTER — Encounter: Payer: Self-pay | Admitting: Internal Medicine

## 2022-07-12 ENCOUNTER — Encounter: Payer: Self-pay | Admitting: Certified Registered Nurse Anesthetist

## 2022-07-13 ENCOUNTER — Ambulatory Visit (AMBULATORY_SURGERY_CENTER): Payer: 59 | Admitting: Gastroenterology

## 2022-07-13 ENCOUNTER — Other Ambulatory Visit: Payer: Self-pay | Admitting: Gastroenterology

## 2022-07-13 ENCOUNTER — Encounter: Payer: Self-pay | Admitting: Gastroenterology

## 2022-07-13 VITALS — BP 133/78 | HR 96 | Temp 96.8°F | Resp 14 | Ht 72.0 in | Wt 212.0 lb

## 2022-07-13 DIAGNOSIS — Z86010 Personal history of colonic polyps: Secondary | ICD-10-CM

## 2022-07-13 DIAGNOSIS — D122 Benign neoplasm of ascending colon: Secondary | ICD-10-CM | POA: Diagnosis not present

## 2022-07-13 DIAGNOSIS — Z09 Encounter for follow-up examination after completed treatment for conditions other than malignant neoplasm: Secondary | ICD-10-CM | POA: Diagnosis present

## 2022-07-13 MED ORDER — SODIUM CHLORIDE 0.9 % IV SOLN: 500.0000 mL | Freq: Once | INTRAVENOUS | Status: DC

## 2022-07-13 NOTE — Progress Notes (Signed)
Report given to PACU, vss 

## 2022-07-13 NOTE — Patient Instructions (Signed)
Resume previous diet and medications. Awaiting pathology results. Repeat Colonoscopy date to be determined based on pathology results.  YOU HAD AN ENDOSCOPIC PROCEDURE TODAY AT THE [facility]:   Refer to the procedure report that was given to you for any specific questions about what was found during the examination.  If the procedure report does not answer your questions, please call your gastroenterologist to clarify.  If you requested that your care partner not be given the details of your procedure findings, then the procedure report has been included in a sealed envelope for you to review at your convenience later.  YOU SHOULD EXPECT: Some feelings of bloating in the abdomen. Passage of more gas than usual.  Walking can help get rid of the air that was put into your GI tract during the procedure and reduce the bloating. If you had a lower endoscopy (such as a colonoscopy or flexible sigmoidoscopy) you may notice spotting of blood in your stool or on the toilet paper. If you underwent a bowel prep for your procedure, you may not have a normal bowel movement for a few days.  Please Note:  You might notice some irritation and congestion in your nose or some drainage.  This is from the oxygen used during your procedure.  There is no need for concern and it should clear up in a day or so.  SYMPTOMS TO REPORT IMMEDIATELY:  Following lower endoscopy (colonoscopy or flexible sigmoidoscopy):  Excessive amounts of blood in the stool  Significant tenderness or worsening of abdominal pains  Swelling of the abdomen that is new, acute  Fever of 100F or higher   For urgent or emergent issues, a gastroenterologist can be reached at any hour by calling [phone]. Do not use MyChart messaging for urgent concerns.    DIET:  We do recommend a small meal at first, but then you may proceed to your regular diet.  Drink plenty of fluids but you should avoid alcoholic beverages for 24  hours.  ACTIVITY:  You should plan to take it easy for the rest of today and you should NOT DRIVE or use heavy machinery until tomorrow (because of the sedation medicines used during the test).    FOLLOW UP: Our staff will call the number listed on your records the next business day following your procedure.  We will call around 7:15- 8:00 am to check on you and address any questions or concerns that you may have regarding the information given to you following your procedure. If we do not reach you, we will leave a message.     If any biopsies were taken you will be contacted by phone or by letter within the next 1-3 weeks.  Please call us at [phone] if you have not heard about the biopsies in 3 weeks.    SIGNATURES/CONFIDENTIALITY: You and/or your care partner have signed paperwork which will be entered into your electronic medical record.  These signatures attest to the fact that that the information above on your After Visit Summary has been reviewed and is understood.  Full responsibility of the confidentiality of this discharge information lies with you and/or your care-partner. 

## 2022-07-13 NOTE — Progress Notes (Signed)
Pt's states no medical or surgical changes since previsit or office visit. 

## 2022-07-13 NOTE — Op Note (Signed)
[facility] Patient Name: [name] Procedure Date: 07/13/2022 9:19 AM MRN: [id number] Endoscopist: Mallie Mussel L. Loletha Carrow , MD, [id number] Age: 66 Referring MD:  Date of Birth: 03-Jun-1956 Gender: Male Account #: [id number] Procedure:                Colonoscopy Indications:              Surveillance: Personal history of adenomatous                            polyps on last colonoscopy 3 years ago                           January 2018---2 mm tubular adenoma (fair                            preparation in some areas on that exam)                           No polyps 2008 Medicines:                Monitored Anesthesia Care Procedure:                Pre-Anesthesia Assessment:                           - Prior to the procedure, a History and Physical                            was performed, and patient medications and                            allergies were reviewed. The patient's tolerance of                            previous anesthesia was also reviewed. The risks                            and benefits of the procedure and the sedation                            options and risks were discussed with the patient.                            All questions were answered, and informed consent                            was obtained. Prior Anticoagulants: The patient has                            taken no anticoagulant or antiplatelet agents. ASA                            Grade Assessment: II - A patient with mild systemic  disease. After reviewing the risks and benefits,                            the patient was deemed in satisfactory condition to                            undergo the procedure.                           After obtaining informed consent, the colonoscope                            was passed under direct vision. Throughout the                            procedure, the patient's blood pressure, pulse, and                             oxygen saturations were monitored continuously. The                            CF [id number] #[id number] was introduced through the anus                            and advanced to the the cecum, identified by                            appendiceal orifice and ileocecal valve. The                            colonoscopy was performed without difficulty. The                            patient tolerated the procedure well. The quality                            of the bowel preparation was good. The ileocecal                            valve, appendiceal orifice, and rectum were                            photographed. The bowel preparation used was SUPREP                            via split dose instruction. Scope In: 9:34:30 AM Scope Out: 9:47:17 AM Scope Withdrawal Time: 0 hours 10 minutes 15 seconds  Total Procedure Duration: 0 hours 12 minutes 47 seconds  Findings:                 The perianal and digital rectal examinations were                            normal.  Repeat examination of right colon under NBI                            performed.                           A diminutive polyp was found in the proximal                            ascending colon. The polyp was sessile. The polyp                            was removed with a cold snare. Resection and                            retrieval were complete.                           Multiple diverticula were found in the left colon                            and right colon.                           The exam was otherwise without abnormality on                            direct and retroflexion views. Complications:            No immediate complications. Estimated Blood Loss:     Estimated blood loss was minimal. Impression:               - One diminutive polyp in the proximal ascending                            colon, removed with a cold snare. Resected and                            retrieved.                            - Diverticulosis in the left colon and in the right                            colon.                           - The examination was otherwise normal on direct                            and retroflexion views. Recommendation:           - Patient has a contact number available for                            emergencies. The signs and symptoms of potential  delayed complications were discussed with the                            patient. Return to normal activities tomorrow.                            Written discharge instructions were provided to the                            patient.                           - Resume previous diet.                           - Continue present medications.                           - Await pathology results.                           - Repeat colonoscopy is recommended for                            surveillance. The colonoscopy date will be                            determined after pathology results from today's                            exam become available for review. Carlene Bickley L. Loletha Carrow, MD 07/13/2022 9:51:50 AM This report has been signed electronically.

## 2022-07-13 NOTE — Progress Notes (Signed)
History and Physical:  This patient presents for endoscopic testing for: Encounter Diagnosis  Name Primary?   Personal history of colonic polyps Yes    24m TA last colonoscopy Jan 2020 - fair prep in some areas on that exam No polyps 2008 Patient denies chronic abdominal pain, rectal bleeding, constipation or diarrhea.   Patient is otherwise without complaints or active issues today.   Past Medical History: Past Medical History:  Diagnosis Date   Adenomatous polyp    Allergy    Arthritis    feet    Bunion 04/02/2013   STATUS POST OP BUN REPAIR   Diabetes mellitus    GERD (gastroesophageal reflux disease)    Gout    Hammertoe    Hyperlipidemia    Hypertension    Neuromuscular disorder ([place]    neuropathy   Plantar flexed metatarsal    Psoriasis      Past Surgical History: Past Surgical History:  Procedure Laterality Date   COLONOSCOPY     FOOT AMPUTATION Right 01/11/2017   Dr TDoreatha Lew([facility])--transmetatarsal   FOOT SURGERY Right 02/28/2013   BShaune LeeksHAM TOE2,3 PINS ,2ND MET OSTEOTOMY, 5TH MET W/SCREW   02-2013, 04-2013   HIP FRACTURE SURGERY  1993   surgery x2   POLYPECTOMY     PROSTATE BIOPSY  10/10/2019   TOTAL HIP ARTHROPLASTY     Left hip replacement/femur fx 07/04, Screw removal left hip- Hines 11/01    Allergies: Allergies  Allergen Reactions   Glipizide Other (See Comments)    REACTION: wt. gain, hands tingling   Lisinopril Other (See Comments)    HYPERKALEMIA   Ramipril Cough   Ibuprofen     UNSPECIFIED REACTION    Bactrim [Sulfamethoxazole-Trimethoprim] Nausea And Vomiting and Rash    Outpatient Meds: Current Outpatient Medications  Medication Sig Dispense Refill   Alpha-Lipoic Acid 600 MG TABS Take by mouth. 2 in am and 1 in pm     amLODipine (NORVASC) 5 MG tablet TAKE 1 TABLET(5 MG) BY MOUTH DAILY 90 tablet 3   aspirin 81 MG tablet Take 81 mg by mouth daily.     B-D UF III MINI PEN NEEDLES 31G X 5 MM MISC USE AS DIRECTED DAILY 90 each 3    CALCIUM-MAGNESIUM-ZINC PO Take by mouth.     Cinnamon 500 MG capsule Take 500 mg by mouth 2 (two) times daily.     CONTOUR NEXT TEST test strip USE 1 TO 2 TIMES DAILY TO  CHECK BLOOD SUGAR 200 strip 3   DULoxetine (CYMBALTA) 30 MG capsule Take 1 capsule (30 mg total) by mouth daily. 90 capsule 3   finasteride (PROSCAR) 5 MG tablet Take 1 tablet (5 mg total) by mouth daily. 30 tablet 11   hydrocortisone valerate ointment (WEST-CORT) 0.2 % APPLY TO AFFECTED AREA(S)  TOPICALLY TWICE DAILY 120 g 1   LANTUS SOLOSTAR 100 UNIT/ML Solostar Pen INJECT SUBCUTANEOUSLY 22  UNITS DAILY 30 mL 3   losartan (COZAAR) 25 MG tablet TAKE 1 TABLET(25 MG) BY MOUTH DAILY 90 tablet 3   metFORMIN (GLUCOPHAGE) 1000 MG tablet TAKE 1 TABLET BY MOUTH TWICE  DAILY WITH MEALS 180 tablet 3   omeprazole (PRILOSEC) 20 MG capsule TAKE 1 CAPSULE BY MOUTH TWICE  DAILY BEFORE MEALS 180 capsule 3   simvastatin (ZOCOR) 40 MG tablet TAKE 1 TABLET BY MOUTH AT  BEDTIME 90 tablet 3   tamsulosin (FLOMAX) 0.4 MG CAPS capsule Take 1 capsule (0.4 mg total) by mouth daily. 30 capsule  11   Tapinarof (VTAMA) 1 % CREA Apply 1 application topically daily. 60 g 2   Tapinarof (VTAMA) 1 % CREA Apply 1 Application topically daily. Qd to aa psoriasis on hands, feet, arms 60 g 11   triamcinolone lotion (KENALOG) 0.1 % APPLY TO AFFECTED AREA(S)  TOPICALLY 3 TIMES DAILY 180 mL 1   furosemide (LASIX) 20 MG tablet Take 1 tablet (20 mg total) by mouth daily as needed. 90 tablet 3   hydrOXYzine (VISTARIL) 25 MG capsule Take 1 capsule (25 mg total) by mouth every 8 (eight) hours as needed for itching. 60 capsule 0   indomethacin (INDOCIN) 25 MG capsule TAKE 1 CAPSULE BY MOUTH 3   TIMES A DAY AS NEEDED 270 capsule 3   Ixekizumab (TALTZ) 80 MG/ML SOAJ Inject 80 mg into the skin every 28 (twenty-eight) days. For maintenance. 1 mL 6   ketoconazole (NIZORAL) 2 % shampoo Apply 1 Application topically as directed. Wash scalp with Ketoconazole shampoo qo shampoo, let  sit for 5 minutes and rinse out (Patient not taking: Reported on 07/04/2022) 120 mL 11   Current Facility-Administered Medications  Medication Dose Route Frequency Provider Last Rate Last Admin   0.9 %  sodium chloride infusion  500 mL Intravenous Once Danis, Kirke Corin, MD          ___________________________________________________________________ Objective   Exam:  BP 133/70   Pulse (!) 102   Temp (!) 96.8 F (36 C)   Ht 6' (1.829 m)   Wt 212 lb (96.2 kg)   SpO2 99%   BMI 28.75 kg/m   CV: regular , S1/S2 Resp: clear to auscultation bilaterally, normal RR and effort noted GI: soft, no tenderness, with active bowel sounds.   Assessment: Encounter Diagnosis  Name Primary?   Personal history of colonic polyps Yes     Plan: Colonoscopy  The benefits and risks of the planned procedure were described in detail with the patient or (when appropriate) their health care proxy.  Risks were outlined as including, but not limited to, bleeding, infection, perforation, adverse medication reaction leading to cardiac or pulmonary decompensation, pancreatitis (if ERCP).  The limitation of incomplete mucosal visualization was also discussed.  No guarantees or warranties were given.    The patient is appropriate for an endoscopic procedure in the ambulatory setting.   - Wilfrid Lund, MD

## 2022-07-13 NOTE — Progress Notes (Signed)
Called to room to assist during endoscopic procedure.  Patient ID and intended procedure confirmed with present staff. Received instructions for my participation in the procedure from the performing physician.  

## 2022-07-14 ENCOUNTER — Telehealth: Payer: Self-pay

## 2022-07-14 NOTE — Telephone Encounter (Signed)
  Follow up Call-     07/13/2022    8:44 AM  Call back number  Post procedure Call Back phone  # [phone]  Permission to leave phone message Yes     Patient questions:  Do you have a fever, pain , or abdominal swelling? No. Pain Score  0 *  Have you tolerated food without any problems? Yes.    Have you been able to return to your normal activities? Yes.    Do you have any questions about your discharge instructions: Diet   No. Medications  No. Follow up visit  No.  Do you have questions or concerns about your Care? No.  Actions: * If pain score is 4 or above: No action needed, pain <4.

## 2022-07-19 ENCOUNTER — Ambulatory Visit: Payer: 59 | Admitting: Internal Medicine

## 2022-07-26 ENCOUNTER — Inpatient Hospital Stay: Payer: 59 | Attending: Internal Medicine | Admitting: Internal Medicine

## 2022-07-26 ENCOUNTER — Encounter: Payer: Self-pay | Admitting: Internal Medicine

## 2022-07-26 VITALS — BP 152/87 | HR 69 | Temp 98.5°F | Resp 18 | Wt 215.0 lb

## 2022-07-26 DIAGNOSIS — Z818 Family history of other mental and behavioral disorders: Secondary | ICD-10-CM | POA: Diagnosis not present

## 2022-07-26 DIAGNOSIS — R5383 Other fatigue: Secondary | ICD-10-CM | POA: Diagnosis not present

## 2022-07-26 DIAGNOSIS — E119 Type 2 diabetes mellitus without complications: Secondary | ICD-10-CM | POA: Diagnosis not present

## 2022-07-26 DIAGNOSIS — I7300 Raynaud's syndrome without gangrene: Secondary | ICD-10-CM | POA: Insufficient documentation

## 2022-07-26 DIAGNOSIS — Z888 Allergy status to other drugs, medicaments and biological substances status: Secondary | ICD-10-CM | POA: Insufficient documentation

## 2022-07-26 DIAGNOSIS — Z886 Allergy status to analgesic agent status: Secondary | ICD-10-CM | POA: Insufficient documentation

## 2022-07-26 DIAGNOSIS — D649 Anemia, unspecified: Secondary | ICD-10-CM

## 2022-07-26 DIAGNOSIS — Z881 Allergy status to other antibiotic agents status: Secondary | ICD-10-CM | POA: Diagnosis not present

## 2022-07-26 DIAGNOSIS — R350 Frequency of micturition: Secondary | ICD-10-CM | POA: Diagnosis not present

## 2022-07-26 DIAGNOSIS — Z79899 Other long term (current) drug therapy: Secondary | ICD-10-CM | POA: Insufficient documentation

## 2022-07-26 DIAGNOSIS — Z833 Family history of diabetes mellitus: Secondary | ICD-10-CM | POA: Diagnosis not present

## 2022-07-26 DIAGNOSIS — L409 Psoriasis, unspecified: Secondary | ICD-10-CM | POA: Insufficient documentation

## 2022-07-26 NOTE — Progress Notes (Signed)
Patient here today for follow up regarding anemia. Patient denies any concerns today. 

## 2022-07-26 NOTE — Progress Notes (Signed)
[facility] CONSULT NOTE  Patient Care Team: Venia Carbon, MD as PCP - General  CHIEF COMPLAINTS/PURPOSE OF CONSULTATION: ANEMIA   HEMATOLOGY HISTORY  # SEP 2023- ANEMIA MCV-platelets WBC-WNL; Iron sat; ferritin;  GFR- CT/US; EGD- in [facility] 2024 /colonoscopy-dec 21st, 2023; prior 3-4 year [Dr.[facility]]   Latest Reference Range & Units Most Recent 12/14/21 10:08 03/29/22 13:12 06/19/22 15:18 06/28/22 11:16  Hemoglobin 13.0 - 17.7 g/dL 12.0 (L) 06/28/22 11:16 13.4 12.2 (L) 11.9 (L) 12.0 (L)  (L): Data is abnormally low  Latest Reference Range & Units 06/28/22 11:16  Iron 38 - 169 ug/dL 98  UIBC 111 - 343 ug/dL 214  TIBC 250 - 450 ug/dL 312  Ferritin 30 - 400 ng/mL 143  Iron Saturation 15 - 55 % 31    # Psoraiss [Dx- 18 years]- 2023- started TNF   HISTORY OF PRESENTING ILLNESS: Alone.  Ambulating independently.   [name] 67 y.o.  male pleasant patient is here to review the results of the workup for anemia.  Patient complains of ongoing fatigue.  Otherwise denies any blood in stools or black or stools.  Patient complains of whitish discoloration of the hands/and feeling cold.  Denies any prior history of Raynaud's.   Review of Systems  Constitutional:  Positive for malaise/fatigue. Negative for chills, diaphoresis, fever and weight loss.  HENT:  Negative for nosebleeds and sore throat.   Eyes:  Negative for double vision.  Respiratory:  Negative for cough, hemoptysis, sputum production, shortness of breath and wheezing.   Cardiovascular:  Negative for chest pain, palpitations, orthopnea and leg swelling.  Gastrointestinal:  Negative for abdominal pain, blood in stool, constipation, diarrhea, heartburn, melena, nausea and vomiting.  Genitourinary:  Positive for frequency. Negative for dysuria and urgency.  Musculoskeletal:  Negative for back pain and joint pain.  Skin: Negative.  Negative for itching and rash.  Neurological:  Negative for dizziness,  tingling, focal weakness, weakness and headaches.  Endo/Heme/Allergies:  Does not bruise/bleed easily.  Psychiatric/Behavioral:  Negative for depression. The patient is not nervous/anxious and does not have insomnia.      MEDICAL HISTORY:  Past Medical History:  Diagnosis Date   Adenomatous polyp    Allergy    Arthritis    feet    Bunion 04/02/2013   STATUS POST OP BUN REPAIR   Diabetes mellitus    GERD (gastroesophageal reflux disease)    Gout    Hammertoe    Hyperlipidemia    Hypertension    Neuromuscular disorder ([place])    neuropathy   Plantar flexed metatarsal    Psoriasis     SURGICAL HISTORY: Past Surgical History:  Procedure Laterality Date   COLONOSCOPY     FOOT AMPUTATION Right 01/11/2017   Dr Doreatha Lew ([facility])--transmetatarsal   FOOT SURGERY Right 02/28/2013   BUNIION HAM TOE2,3 PINS ,2ND MET OSTEOTOMY, 5TH MET W/SCREW   02-2013, 04-2013   HIP FRACTURE SURGERY  1993   surgery x2   POLYPECTOMY     PROSTATE BIOPSY  10/10/2019   TOTAL HIP ARTHROPLASTY     Left hip replacement/femur fx 07/04, Screw removal left hip- [facility] 11/01    SOCIAL HISTORY: Social History   Socioeconomic History   Marital status: Married    Spouse name: Not on file   Number of children: 3   Years of education: Not on file   Highest education level: Not on file  Occupational History   Occupation: [place]  Employer: [employer]  Tobacco Use   Smoking status: Never    Passive exposure: Past   Smokeless tobacco: Never  Vaping Use   Vaping Use: Never used  Substance and Sexual Activity   Alcohol use: Yes    Comment: occasional (DWI 1991)   Drug use: No   Sexual activity: Yes    Birth control/protection: None  Other Topics Concern   Not on file  Social History Narrative    Works for [employer]; alcohol 2-3 times a week; never smoked; lives in [place]. With wife; and 2 dog.     Social Determinants of Health   Financial Resource Strain: Not on file  Food  Insecurity: Not on file  Transportation Needs: Not on file  Physical Activity: Not on file  Stress: Not on file  Social Connections: Not on file  Intimate Partner Violence: Not on file    FAMILY HISTORY: Family History  Problem Relation Age of Onset   Diabetes Mother    Diabetes Brother    Alzheimer's disease Maternal Aunt    Alzheimer's disease Cousin    Cancer Neg Hx    Colon cancer Neg Hx    Colon polyps Neg Hx    Rectal cancer Neg Hx    Stomach cancer Neg Hx    Esophageal cancer Neg Hx     ALLERGIES:  is allergic to glipizide, lisinopril, ramipril, ibuprofen, and bactrim [sulfamethoxazole-trimethoprim].  MEDICATIONS:  Current Outpatient Medications  Medication Sig Dispense Refill   Alpha-Lipoic Acid 600 MG TABS Take by mouth. 2 in am and 1 in pm     amLODipine (NORVASC) 5 MG tablet TAKE 1 TABLET(5 MG) BY MOUTH DAILY 90 tablet 3   aspirin 81 MG tablet Take 81 mg by mouth daily.     B-D UF III MINI PEN NEEDLES 31G X 5 MM MISC USE AS DIRECTED DAILY 90 each 3   CALCIUM-MAGNESIUM-ZINC PO Take by mouth.     Cinnamon 500 MG capsule Take 500 mg by mouth 2 (two) times daily.     CONTOUR NEXT TEST test strip USE 1 TO 2 TIMES DAILY TO  CHECK BLOOD SUGAR 200 strip 3   DULoxetine (CYMBALTA) 30 MG capsule Take 1 capsule (30 mg total) by mouth daily. 90 capsule 3   finasteride (PROSCAR) 5 MG tablet Take 1 tablet (5 mg total) by mouth daily. 30 tablet 11   furosemide (LASIX) 20 MG tablet Take 1 tablet (20 mg total) by mouth daily as needed. 90 tablet 3   hydrocortisone valerate ointment (WEST-CORT) 0.2 % APPLY TO AFFECTED AREA(S)  TOPICALLY TWICE DAILY 120 g 1   hydrOXYzine (VISTARIL) 25 MG capsule Take 1 capsule (25 mg total) by mouth every 8 (eight) hours as needed for itching. 60 capsule 0   indomethacin (INDOCIN) 25 MG capsule TAKE 1 CAPSULE BY MOUTH 3   TIMES A DAY AS NEEDED 270 capsule 3   Ixekizumab (TALTZ) 80 MG/ML SOAJ Inject 80 mg into the skin every 28 (twenty-eight) days. For  maintenance. 1 mL 6   LANTUS SOLOSTAR 100 UNIT/ML Solostar Pen INJECT SUBCUTANEOUSLY 22  UNITS DAILY 30 mL 3   losartan (COZAAR) 25 MG tablet TAKE 1 TABLET(25 MG) BY MOUTH DAILY 90 tablet 3   metFORMIN (GLUCOPHAGE) 1000 MG tablet TAKE 1 TABLET BY MOUTH TWICE  DAILY WITH MEALS 180 tablet 3   omeprazole (PRILOSEC) 20 MG capsule TAKE 1 CAPSULE BY MOUTH TWICE  DAILY BEFORE MEALS 180 capsule 3   simvastatin (ZOCOR)  40 MG tablet TAKE 1 TABLET BY MOUTH AT  BEDTIME 90 tablet 3   tamsulosin (FLOMAX) 0.4 MG CAPS capsule Take 1 capsule (0.4 mg total) by mouth daily. 30 capsule 11   Tapinarof (VTAMA) 1 % CREA Apply 1 application topically daily. 60 g 2   Tapinarof (VTAMA) 1 % CREA Apply 1 Application topically daily. Qd to aa psoriasis on hands, feet, arms 60 g 11   triamcinolone lotion (KENALOG) 0.1 % APPLY TO AFFECTED AREA(S)  TOPICALLY 3 TIMES DAILY 180 mL 1   ketoconazole (NIZORAL) 2 % shampoo Apply 1 Application topically as directed. Wash scalp with Ketoconazole shampoo qo shampoo, let sit for 5 minutes and rinse out (Patient not taking: Reported on 07/26/2022) 120 mL 11   No current facility-administered medications for this visit.     Marland Kitchen  PHYSICAL EXAMINATION:   Vitals:   07/26/22 1531 07/26/22 1533  BP:  (!) 152/87  Pulse:  69  Resp: 18   Temp: 98.5 F (36.9 C)    Filed Weights   07/26/22 1531  Weight: 215 lb (97.5 kg)    Physical Exam Vitals and nursing note reviewed.  HENT:     Head: Normocephalic and atraumatic.     Mouth/Throat:     Pharynx: Oropharynx is clear.  Eyes:     Extraocular Movements: Extraocular movements intact.     Pupils: Pupils are equal, round, and reactive to light.  Cardiovascular:     Rate and Rhythm: Normal rate and regular rhythm.  Abdominal:     Palpations: Abdomen is soft.  Musculoskeletal:        General: Normal range of motion.     Cervical back: Normal range of motion.  Skin:    General: Skin is warm.  Neurological:     General: No focal  deficit present.     Mental Status: He is alert and oriented to person, place, and time.  Psychiatric:        Behavior: Behavior normal.        Judgment: Judgment normal.      LABORATORY DATA:  I have reviewed the data as listed Lab Results  Component Value Date   WBC 6.6 06/28/2022   HGB 12.0 (L) 06/28/2022   HCT 34.6 (L) 06/28/2022   MCV 97 06/28/2022   PLT 420 06/28/2022   Recent Labs    12/14/21 1008 03/29/22 1312 06/19/22 1518 06/23/22 0954 06/28/22 1116  NA 137 136 124* 127* 134  K 5.3* 5.0 5.5* 4.6 4.3  CL 95* 95* 87* 86* 93*  CO2 26 19* _0 GLUCOSE 162* 159* 133* 163* 298*  BUN 6* _1 CREATININE 1.06 1.07 1.11 1.26 1.04  CALCIUM 9.3 9.5 9.0 9.5 9.6  PROT 6.9 6.4 6.4  --   --   ALBUMIN 4.4 4.4 4.5 4.5 4.3  AST 33 33 35  --   --   ALT _2 --   --   ALKPHOS 62 92 76  --   --   BILITOT 0.5 0.4 0.5  --   --      No results found.  ASSESSMENT & PLAN:   Symptomatic anemia # Anemia- mild Hb 12; normocytic [since MAY 2023]-e DEC 2023-  Iron studies; ferritin no evidence of iron deficiency; myeloma panel kappa lambda light chain; hepatitis screening; LDH haptoglobin; CRP-within normal limit.  Peripheral smear noticeably schistocytes.   # Given the absence of any obvious cause of anemia-I  would recommend a bone marrow biopsy.  However given anemia is quite mild at 12-I think it is reasonable to monitor/repeat blood work in 3 months.  If further drop in hemoglobin noted/or no improvement noted consider bone marrow biopsy at that time.  Patient agreement.  # # Urinary Issues- ? Chronic prostatitis [Dr.Brandon]-no evidence of blood in urine.  On Flomax plus finasteride.  # Psoriasis- on TNF blocker-TALTZ [Dr.Kowalski]-stable/improved.  # ?  Raynauds-defer to PCP/dermatology.  # DISPOSITION:    # follow up in 3 months- MD; 1 week prior-[facility] requistion cbc/bmp; LDH; B84; folic acid- Dr.B     All questions were answered. The patient knows to  call the clinic with any problems, questions or concerns.  Cammie Sickle, MD 07/26/2022 4:26 PM

## 2022-07-26 NOTE — Assessment & Plan Note (Addendum)
#  Anemia- mild Hb 12; normocytic [since MAY 2023]-e DEC 2023-  Iron studies; ferritin no evidence of iron deficiency; myeloma panel kappa lambda light chain; hepatitis screening; LDH haptoglobin; CRP-within normal limit.  Peripheral smear noticeably schistocytes.   # Given the absence of any obvious cause of anemia-I would recommend a bone marrow biopsy.  However given anemia is quite mild at 12-I think it is reasonable to monitor/repeat blood work in 3 months.  If further drop in hemoglobin noted/or no improvement noted consider bone marrow biopsy at that time.  Patient agreement.  # # Urinary Issues- ? Chronic prostatitis [Dr.Brandon]-no evidence of blood in urine.  On Flomax plus finasteride.  # Psoriasis- on TNF blocker-TALTZ [Dr.Kowalski]-stable/improved.  # ?  Raynauds-defer to PCP/dermatology.  # DISPOSITION:    # follow up in 3 months- MD; 1 week prior-[facility] requistion cbc/bmp; LDH; T02; folic acid- Dr.B

## 2022-07-27 ENCOUNTER — Encounter: Payer: Self-pay | Admitting: Internal Medicine

## 2022-08-07 ENCOUNTER — Ambulatory Visit: Payer: 59 | Admitting: Dermatology

## 2022-08-07 ENCOUNTER — Encounter: Payer: Self-pay | Admitting: Gastroenterology

## 2022-08-14 LAB — HM DIABETES EYE EXAM

## 2022-08-15 ENCOUNTER — Encounter
Admission: RE | Admit: 2022-08-15 | Discharge: 2022-08-15 | Disposition: A | Discharge status: Home / Self Care | Payer: 59 | Source: Ambulatory Visit | Attending: Internal Medicine | Admitting: Internal Medicine

## 2022-08-15 VITALS — Ht 72.0 in | Wt 208.0 lb

## 2022-08-15 DIAGNOSIS — E1140 Type 2 diabetes mellitus with diabetic neuropathy, unspecified: Secondary | ICD-10-CM

## 2022-08-15 DIAGNOSIS — I10 Essential (primary) hypertension: Secondary | ICD-10-CM

## 2022-08-15 HISTORY — DX: Anemia, unspecified: D64.9

## 2022-08-15 NOTE — Patient Instructions (Addendum)
Your procedure is scheduled on: Thursday August 24, 2022. Report to Day Surgery inside [facility] 2nd floor, stop by registration desk before getting on elevator. To find out your arrival time please call [phone] between 1PM - 3PM on Wednesday August 23, 2022.  Remember: Instructions that are not followed completely may result in serious medical risk,  up to and including death, or upon the discretion of your surgeon and anesthesiologist your  surgery may need to be rescheduled.     _X__ 1. Do not eat food after midnight the night before your procedure.                 No chewing gum or hard candies. You may drink clear liquids up to 2 hours                 before you are scheduled to arrive for your surgery- DO not drink clear                 liquids within 2 hours of the start of your surgery.                 Clear Liquids include:  water   __X__2.  On the morning of surgery brush your teeth with toothpaste and water, you                may rinse your mouth with mouthwash if you wish.  Do not swallow any toothpaste or mouthwash.     _X__ 3.  No Alcohol for 24 hours before or after surgery.   _X__ 4.  Do Not Smoke or use e-cigarettes For 24 Hours Prior to Your Surgery.                 Do not use any chewable tobacco products for at least 6 hours prior to                 Surgery.  _X__  5.  Do not use any recreational drugs (marijuana, cocaine, heroin, ecstasy, MDMA or other)                For at least one week prior to your surgery.  Combination of these drugs with anesthesia                May have life threatening results.  ____  6.  Bring all medications with you on the day of surgery if instructed.   __X_  7.  Notify your doctor if there is any change in your medical condition      (cold, fever, infections).     Do not wear jewelry, make-up, hairpins, clips or nail polish. Do not wear lotions, powders, or perfumes. You may wear deodorant. Do  not shave 48 hours prior to surgery. Men may shave face and neck. Do not bring valuables to the hospital.    [facility] is not responsible for any belongings or valuables.  Contacts, dentures or bridgework may not be worn into surgery. Leave your suitcase in the car. After surgery it may be brought to your room. For patients admitted to the hospital, discharge time is determined by your treatment team.   Patients discharged the day of surgery will not be allowed to drive home.   Make arrangements for someone to be with you for the first 24 hours of your Same Day Discharge.   __X__ Take these medicines the morning of surgery with A SIP OF WATER:  1. amLODipine (NORVASC) 5 MG   2. DULoxetine (CYMBALTA) 30 MG   3. finasteride (PROSCAR) 5 MG   4. omeprazole (PRILOSEC) 20 MG (dose   5.   6.   ____ Fleet Enema (as directed)   __X__ Use CHG Soap (or wipes) as directed  ____ Use Benzoyl Peroxide Gel as instructed  ____ Use inhalers on the day of surgery  __X__ Stop metFORMIN (GLUCOPHAGE) 1000 MG 2 days prior to surgery    __X__ . LANTUS SOLOSTAR 100 UNIT/ML Solostar Pen  NO insulin the morning of surgery.   __X__ Call your PCP, cardiologist, or Pulmonologist if taking aspirin 81 mg and ask when to stop before your surgery.   __X__ One Week prior to surgery- Stop Anti-inflammatories such as Ibuprofen, Aleve, Advil, Motrin, meloxicam (MOBIC), diclofenac, etodolac, ketorolac, Toradol, Daypro, piroxicam, Goody's or BC powders. OK TO USE TYLENOL IF NEEDED   __X__ One week prior to surgery stop ALL vitamins and or supplements until after surgery. Cinnamon    ____ Bring C-Pap to the hospital.    If you have any questions regarding your pre-procedure instructions,  Please call Pre-admit Testing at [phone]    Preparing for Surgery with CHLORHEXIDINE GLUCONATE (CHG) Soap  Chlorhexidine Gluconate (CHG) Soap  o An antiseptic cleaner that kills germs and bonds with the skin  to continue killing germs even after washing  o Used for showering the night before surgery and morning of surgery  Before surgery, you can play an important role by reducing the number of germs on your skin.  CHG (Chlorhexidine gluconate) soap is an antiseptic cleanser which kills germs and bonds with the skin to continue killing germs even after washing.  Please do not use if you have an allergy to CHG or antibacterial soaps. If your skin becomes reddened/irritated stop using the CHG.  1. Shower the NIGHT BEFORE SURGERY and the MORNING OF SURGERY with CHG soap.  2. If you choose to wash your hair, wash your hair first as usual with your normal shampoo.  3. After shampooing, rinse your hair and body thoroughly to remove the shampoo.  4. Use CHG as you would any other liquid soap. You can apply CHG directly to the skin and wash gently with a scrungie or a clean washcloth.  5. Apply the CHG soap to your body only from the neck down. Do not use on open wounds or open sores. Avoid contact with your eyes, ears, mouth, and genitals (private parts). Wash face and genitals (private parts) with your normal soap.  6. Wash thoroughly, paying special attention to the area where your surgery will be performed.  7. Thoroughly rinse your body with warm water.  8. Do not shower/wash with your normal soap after using and rinsing off the CHG soap.  9. Pat yourself dry with a clean towel.  10. Wear clean pajamas to bed the night before surgery.  12. Place clean sheets on your bed the night of your first shower and do not sleep with pets.  13. Shower again with the CHG soap on the day of surgery prior to arriving at the hospital.  14. Do not apply any deodorants/lotions/powders.  15. Please wear clean clothes to the hospital.

## 2022-08-17 ENCOUNTER — Encounter
Admission: RE | Admit: 2022-08-17 | Discharge: 2022-08-17 | Disposition: A | Discharge status: Home / Self Care | Payer: 59 | Source: Ambulatory Visit | Attending: Specialist | Admitting: Specialist

## 2022-08-17 DIAGNOSIS — I10 Essential (primary) hypertension: Secondary | ICD-10-CM

## 2022-08-17 DIAGNOSIS — Z01810 Encounter for preprocedural cardiovascular examination: Secondary | ICD-10-CM | POA: Diagnosis not present

## 2022-08-23 ENCOUNTER — Other Ambulatory Visit: Payer: Self-pay | Admitting: Specialist

## 2022-08-23 NOTE — H&P (Signed)
PREOPERATIVE H&P  Chief Complaint: G56.02 Carpal tunnel syndrome, left upper limb  HPI: [name] is a 67 y.o. male who presents for preoperative history and physical with a diagnosis of G56.02 Carpal tunnel syndrome, left upper limb.  He has failed conservative treatment.  Nerve conduction studies show significant compression of the median nerve.  Symptoms are rated as moderate to severe, and have been worsening.  This is significantly impairing activities of daily living.  He has elected for surgical management.   Past Medical History:  Diagnosis Date   Adenomatous polyp    Allergy    Anemia    Arthritis    feet    Bunion 04/02/2013   STATUS POST OP BUN REPAIR   Diabetes mellitus    GERD (gastroesophageal reflux disease)    Gout    Hammertoe    Hyperlipidemia    Hypertension    Neuromuscular disorder ([place])    neuropathy   Plantar flexed metatarsal    Psoriasis    Past Surgical History:  Procedure Laterality Date   COLONOSCOPY     FOOT AMPUTATION Right 01/11/2017   Dr Doreatha Lew ([facility])--transmetatarsal   FOOT SURGERY Right 02/28/2013   Shaune Leeks HAM TOE2,3 PINS ,2ND MET OSTEOTOMY, 5TH MET W/SCREW   02-2013, 04-2013   HIP FRACTURE SURGERY  1993   surgery x2   POLYPECTOMY     PROSTATE BIOPSY  10/10/2019   TOTAL HIP ARTHROPLASTY     Left hip replacement/femur fx 07/04, Screw removal left hip- [facility] 11/01   Social History   Socioeconomic History   Marital status: Married    Spouse name: Not on file   Number of children: 3   Years of education: Not on file   Highest education level: Not on file  Occupational History   Occupation: PC ADMINISTRATOR    Employer: [employer]  Tobacco Use   Smoking status: Never    Passive exposure: Past   Smokeless tobacco: Never  Vaping Use   Vaping Use: Never used  Substance and Sexual Activity   Alcohol use: Yes    Alcohol/week: 1.0 standard drink of alcohol    Types: 1 Standard drinks or equivalent per week    Comment: occasional  (DWI 1991)   Drug use: No   Sexual activity: Yes    Birth control/protection: None  Other Topics Concern   Not on file  Social History Narrative    Works for [employer]; alcohol 2-3 times a week; never smoked; lives in [place]. With wife; and 2 dog.     Social Determinants of Health   Financial Resource Strain: Not on file  Food Insecurity: Not on file  Transportation Needs: Not on file  Physical Activity: Not on file  Stress: Not on file  Social Connections: Not on file   Family History  Problem Relation Age of Onset   Diabetes Mother    Diabetes Brother    Alzheimer's disease Maternal Aunt    Alzheimer's disease Cousin    Cancer Neg Hx    Colon cancer Neg Hx    Colon polyps Neg Hx    Rectal cancer Neg Hx    Stomach cancer Neg Hx    Esophageal cancer Neg Hx    Allergies  Allergen Reactions   Glipizide Other (See Comments)    REACTION: wt. gain, hands tingling   Lisinopril Other (See Comments)    HYPERKALEMIA   Ramipril Cough   Ibuprofen     UNSPECIFIED REACTION  Bactrim [Sulfamethoxazole-Trimethoprim] Nausea And Vomiting and Rash   Prior to Admission medications   Medication Sig Start Date End Date Taking? Authorizing Provider  Alpha-Lipoic Acid 600 MG TABS Take by mouth. 2 in am and 1 in pm    [provider]  amLODipine (NORVASC) 5 MG tablet TAKE 1 TABLET(5 MG) BY MOUTH DAILY 05/15/22   Viviana Simpler I, MD  aspirin 81 MG tablet Take 81 mg by mouth daily.    [provider]  B-D UF III MINI PEN NEEDLES 31G X 5 MM MISC USE AS DIRECTED DAILY 05/08/22   Venia Carbon, MD  CALCIUM-MAGNESIUM-ZINC PO Take by mouth.    [provider]  Cinnamon 500 MG capsule Take 500 mg by mouth 2 (two) times daily.    [provider]  CONTOUR NEXT TEST test strip USE 1 TO 2 TIMES DAILY TO  CHECK BLOOD SUGAR 07/11/22   Venia Carbon, MD  DULoxetine (CYMBALTA) 30 MG capsule Take 1 capsule (30 mg total) by mouth daily. 06/19/22    Venia Carbon, MD  finasteride (PROSCAR) 5 MG tablet Take 1 tablet (5 mg total) by mouth daily. 06/30/22   Hollice Espy, MD  furosemide (LASIX) 20 MG tablet Take 1 tablet (20 mg total) by mouth daily as needed. 02/14/19   Venia Carbon, MD  hydrocortisone valerate ointment (WEST-CORT) 0.2 % APPLY TO AFFECTED AREA(S)  TOPICALLY TWICE DAILY 05/08/22   Venia Carbon, MD  hydrOXYzine (VISTARIL) 25 MG capsule Take 1 capsule (25 mg total) by mouth every 8 (eight) hours as needed for itching. 04/17/22   Venia Carbon, MD  indomethacin (INDOCIN) 25 MG capsule TAKE 1 CAPSULE BY MOUTH 3   TIMES A DAY AS NEEDED 09/10/17   Venia Carbon, MD  Ixekizumab (TALTZ) 80 MG/ML SOAJ Inject 80 mg into the skin every 28 (twenty-eight) days. For maintenance. 01/25/22   Ralene Bathe, MD  ketoconazole (NIZORAL) 2 % shampoo Apply 1 Application topically as directed. Wash scalp with Ketoconazole shampoo qo shampoo, let sit for 5 minutes and rinse out Patient not taking: Reported on 07/26/2022 01/05/22   Ralene Bathe, MD  LANTUS SOLOSTAR 100 UNIT/ML Solostar Pen INJECT SUBCUTANEOUSLY 22  UNITS DAILY 05/19/22   Venia Carbon, MD  losartan (COZAAR) 25 MG tablet TAKE 1 TABLET(25 MG) BY MOUTH DAILY 02/06/22   Viviana Simpler I, MD  metFORMIN (GLUCOPHAGE) 1000 MG tablet TAKE 1 TABLET BY MOUTH TWICE  DAILY WITH MEALS 02/06/22   Viviana Simpler I, MD  omeprazole (PRILOSEC) 20 MG capsule TAKE 1 CAPSULE BY MOUTH TWICE  DAILY BEFORE MEALS 01/12/22   Viviana Simpler I, MD  simvastatin (ZOCOR) 40 MG tablet TAKE 1 TABLET BY MOUTH AT  BEDTIME 02/06/22   Viviana Simpler I, MD  tamsulosin (FLOMAX) 0.4 MG CAPS capsule Take 1 capsule (0.4 mg total) by mouth daily. 06/30/22   Hollice Espy, MD  Tapinarof (VTAMA) 1 % CREA Apply 1 application topically daily. 06/13/21   Ralene Bathe, MD  Tapinarof (VTAMA) 1 % CREA Apply 1 Application topically daily. Qd to aa psoriasis on hands, feet, arms 01/05/22   Ralene Bathe,  MD  triamcinolone lotion (KENALOG) 0.1 % APPLY TO AFFECTED AREA(S)  TOPICALLY 3 TIMES DAILY 05/08/22   Venia Carbon, MD     Positive ROS: All other systems have been reviewed and were otherwise negative with the exception of those mentioned in the HPI and as above.  Physical Exam: General:  Alert, no acute distress Cardiovascular: No pedal edema. Heart is regular and without murmur.  Respiratory: No cyanosis, no use of accessory musculature. Lungs are clear. GI: No organomegaly, abdomen is soft and non-tender Skin: No lesions in the area of chief complaint Neurologic: Sensation intact distally Psychiatric: Patient is competent for consent with normal mood and affect Lymphatic: No axillary or cervical lymphadenopathy  MUSCULOSKELETAL: Positive medial compression left hand.  There is some thenar wasting.  Pinch is weak.  Gross sensation intact.  Grip is good.  Skin is intact.  Assessment: G56.02 Carpal tunnel syndrome, left upper limb  Plan: Plan for Procedure(s): CARPAL TUNNEL RELEASE  The risks benefits and alternatives were discussed with the patient including but not limited to the risks of nonoperative treatment, versus surgical intervention including infection, bleeding, nerve injury,  blood clots, cardiopulmonary complications, morbidity, mortality, among others, and they were willing to proceed.   Park Breed, MD [phone]   08/23/2022 11:07 AM

## 2022-08-24 ENCOUNTER — Other Ambulatory Visit: Payer: Self-pay

## 2022-08-24 ENCOUNTER — Encounter: Payer: Self-pay | Admitting: Specialist

## 2022-08-24 ENCOUNTER — Ambulatory Visit
Admission: RE | Admit: 2022-08-24 | Discharge: 2022-08-24 | Disposition: A | Discharge status: Home / Self Care | Payer: 59 | Source: Ambulatory Visit | Attending: Specialist | Admitting: Specialist

## 2022-08-24 ENCOUNTER — Ambulatory Visit: Payer: 59 | Admitting: Certified Registered"

## 2022-08-24 ENCOUNTER — Encounter
Admission: RE | Disposition: A | Discharge status: Home / Self Care | Payer: Self-pay | Source: Ambulatory Visit | Attending: Specialist

## 2022-08-24 DIAGNOSIS — Z794 Long term (current) use of insulin: Secondary | ICD-10-CM | POA: Insufficient documentation

## 2022-08-24 DIAGNOSIS — D649 Anemia, unspecified: Secondary | ICD-10-CM | POA: Insufficient documentation

## 2022-08-24 DIAGNOSIS — Z7984 Long term (current) use of oral hypoglycemic drugs: Secondary | ICD-10-CM | POA: Diagnosis not present

## 2022-08-24 DIAGNOSIS — G5602 Carpal tunnel syndrome, left upper limb: Secondary | ICD-10-CM | POA: Diagnosis present

## 2022-08-24 DIAGNOSIS — E109 Type 1 diabetes mellitus without complications: Secondary | ICD-10-CM | POA: Insufficient documentation

## 2022-08-24 DIAGNOSIS — K219 Gastro-esophageal reflux disease without esophagitis: Secondary | ICD-10-CM | POA: Diagnosis not present

## 2022-08-24 DIAGNOSIS — I10 Essential (primary) hypertension: Secondary | ICD-10-CM | POA: Insufficient documentation

## 2022-08-24 DIAGNOSIS — E785 Hyperlipidemia, unspecified: Secondary | ICD-10-CM | POA: Diagnosis not present

## 2022-08-24 DIAGNOSIS — E1140 Type 2 diabetes mellitus with diabetic neuropathy, unspecified: Secondary | ICD-10-CM

## 2022-08-24 HISTORY — PX: CARPAL TUNNEL RELEASE: SHX101

## 2022-08-24 LAB — GLUCOSE, CAPILLARY
Glucose-Capillary: 152 mg/dL — ABNORMAL HIGH (ref 70–99)
Glucose-Capillary: 191 mg/dL — ABNORMAL HIGH (ref 70–99)

## 2022-08-24 SURGERY — CARPAL TUNNEL RELEASE
Anesthesia: General | Laterality: Left

## 2022-08-24 MED ORDER — NEOMYCIN-POLYMYXIN B GU 40-200000 IR SOLN
Status: AC
  Filled 2022-08-24: qty 20

## 2022-08-24 MED ORDER — ACETAMINOPHEN 10 MG/ML IV SOLN
INTRAVENOUS | Status: AC
  Filled 2022-08-24: qty 100

## 2022-08-24 MED ORDER — EPHEDRINE 5 MG/ML INJ
INTRAVENOUS | Status: AC
  Filled 2022-08-24: qty 5

## 2022-08-24 MED ORDER — ONDANSETRON HCL 4 MG/2ML IJ SOLN
INTRAMUSCULAR | Status: DC | PRN
  Administered 2022-08-24: 4 mg via INTRAVENOUS

## 2022-08-24 MED ORDER — FENTANYL CITRATE (PF) 100 MCG/2ML IJ SOLN
INTRAMUSCULAR | Status: DC | PRN
  Administered 2022-08-24 (×2): 50 ug via INTRAVENOUS

## 2022-08-24 MED ORDER — PROPOFOL 10 MG/ML IV BOLUS
INTRAVENOUS | Status: DC | PRN
  Administered 2022-08-24: 70 mg via INTRAVENOUS
  Administered 2022-08-24: 150 mg via INTRAVENOUS

## 2022-08-24 MED ORDER — MELOXICAM 15 MG PO TABS: 15.0000 mg | ORAL_TABLET | Freq: Every day | ORAL | 3 refills | Status: DC

## 2022-08-24 MED ORDER — FENTANYL CITRATE (PF) 100 MCG/2ML IJ SOLN: 25.0000 ug | INTRAMUSCULAR | Status: DC | PRN

## 2022-08-24 MED ORDER — LIDOCAINE HCL (PF) 2 % IJ SOLN
INTRAMUSCULAR | Status: AC
  Filled 2022-08-24: qty 5

## 2022-08-24 MED ORDER — MIDAZOLAM HCL 2 MG/2ML IJ SOLN
INTRAMUSCULAR | Status: DC | PRN
  Administered 2022-08-24: 2 mg via INTRAVENOUS

## 2022-08-24 MED ORDER — CHLORHEXIDINE GLUCONATE 0.12 % MT SOLN
OROMUCOSAL | Status: AC
  Administered 2022-08-24: 15 mL via OROMUCOSAL
  Filled 2022-08-24: qty 15

## 2022-08-24 MED ORDER — LIDOCAINE HCL (CARDIAC) PF 100 MG/5ML IV SOSY
PREFILLED_SYRINGE | INTRAVENOUS | Status: DC | PRN
  Administered 2022-08-24: 100 mg via INTRAVENOUS

## 2022-08-24 MED ORDER — CEFAZOLIN SODIUM-DEXTROSE 2-4 GM/100ML-% IV SOLN
2.0000 g | INTRAVENOUS | Status: AC
  Administered 2022-08-24: 2 g via INTRAVENOUS

## 2022-08-24 MED ORDER — FENTANYL CITRATE (PF) 100 MCG/2ML IJ SOLN
INTRAMUSCULAR | Status: AC
  Filled 2022-08-24: qty 2

## 2022-08-24 MED ORDER — ONDANSETRON HCL 4 MG/2ML IJ SOLN: 4.0000 mg | Freq: Once | INTRAMUSCULAR | Status: DC | PRN

## 2022-08-24 MED ORDER — MIDAZOLAM HCL 2 MG/2ML IJ SOLN
INTRAMUSCULAR | Status: AC
  Filled 2022-08-24: qty 2

## 2022-08-24 MED ORDER — HYDROCODONE-ACETAMINOPHEN 5-325 MG PO TABS: 1.0000 | ORAL_TABLET | Freq: Four times a day (QID) | ORAL | 0 refills | Status: DC | PRN

## 2022-08-24 MED ORDER — SODIUM CHLORIDE 0.9 % IR SOLN
Status: DC | PRN
  Administered 2022-08-24: 502 mL via SURGICAL_CAVITY

## 2022-08-24 MED ORDER — GABAPENTIN 400 MG PO CAPS: 400.0000 mg | ORAL_CAPSULE | Freq: Three times a day (TID) | ORAL | 3 refills | Status: DC

## 2022-08-24 MED ORDER — BUPIVACAINE HCL (PF) 0.5 % IJ SOLN
INTRAMUSCULAR | Status: AC
  Filled 2022-08-24: qty 30

## 2022-08-24 MED ORDER — PHENYLEPHRINE HCL (PRESSORS) 10 MG/ML IV SOLN
INTRAVENOUS | Status: DC | PRN
  Administered 2022-08-24: 160 ug via INTRAVENOUS
  Administered 2022-08-24 (×4): 80 ug via INTRAVENOUS

## 2022-08-24 MED ORDER — GABAPENTIN 300 MG PO CAPS
ORAL_CAPSULE | ORAL | Status: AC
  Filled 2022-08-24: qty 1

## 2022-08-24 MED ORDER — ORAL CARE MOUTH RINSE: 15.0000 mL | Freq: Once | OROMUCOSAL | Status: AC

## 2022-08-24 MED ORDER — PHENYLEPHRINE 80 MCG/ML (10ML) SYRINGE FOR IV PUSH (FOR BLOOD PRESSURE SUPPORT)
PREFILLED_SYRINGE | INTRAVENOUS | Status: AC
  Filled 2022-08-24: qty 10

## 2022-08-24 MED ORDER — BUPIVACAINE HCL 0.5 % IJ SOLN
INTRAMUSCULAR | Status: DC | PRN
  Administered 2022-08-24: 18 mL

## 2022-08-24 MED ORDER — EPHEDRINE SULFATE (PRESSORS) 50 MG/ML IJ SOLN
INTRAMUSCULAR | Status: DC | PRN
  Administered 2022-08-24: 5 mg via INTRAVENOUS
  Administered 2022-08-24: 10 mg via INTRAVENOUS
  Administered 2022-08-24 (×2): 5 mg via INTRAVENOUS

## 2022-08-24 MED ORDER — PROPOFOL 10 MG/ML IV BOLUS
INTRAVENOUS | Status: AC
  Filled 2022-08-24: qty 20

## 2022-08-24 MED ORDER — ACETAMINOPHEN 10 MG/ML IV SOLN
INTRAVENOUS | Status: DC | PRN
  Administered 2022-08-24: 1000 mg via INTRAVENOUS

## 2022-08-24 MED ORDER — SODIUM CHLORIDE 0.9 % IV SOLN: INTRAVENOUS | Status: DC

## 2022-08-24 MED ORDER — CHLORHEXIDINE GLUCONATE CLOTH 2 % EX PADS: 6.0000 | MEDICATED_PAD | Freq: Once | CUTANEOUS | Status: DC

## 2022-08-24 MED ORDER — CEFAZOLIN SODIUM-DEXTROSE 2-4 GM/100ML-% IV SOLN
INTRAVENOUS | Status: AC
  Filled 2022-08-24: qty 100

## 2022-08-24 MED ORDER — CHLORHEXIDINE GLUCONATE 0.12 % MT SOLN: 15.0000 mL | Freq: Once | OROMUCOSAL | Status: AC

## 2022-08-24 MED ORDER — GABAPENTIN 300 MG PO CAPS
300.0000 mg | ORAL_CAPSULE | ORAL | Status: AC
  Administered 2022-08-24: 300 mg via ORAL

## 2022-08-24 SURGICAL SUPPLY — 33 items
BLADE SURG MINI STRL (BLADE) ×1 IMPLANT
BNDG ESMARCH 4 X 12 STRL LF (GAUZE/BANDAGES/DRESSINGS) ×1
BNDG ESMARCH 4X12 STRL LF (GAUZE/BANDAGES/DRESSINGS) ×1 IMPLANT
CHLORAPREP W/TINT 26 (MISCELLANEOUS) ×1 IMPLANT
CUFF TOURN SGL QUICK 18X4 (TOURNIQUET CUFF) IMPLANT
DRSG GAUZE FLUFF 36X18 (GAUZE/BANDAGES/DRESSINGS) ×2 IMPLANT
ELECT REM PT RETURN 9FT ADLT (ELECTROSURGICAL) ×1
ELECTRODE REM PT RTRN 9FT ADLT (ELECTROSURGICAL) ×1 IMPLANT
GAUZE SPONGE 4X4 12PLY STRL (GAUZE/BANDAGES/DRESSINGS) IMPLANT
GAUZE XEROFORM 1X8 LF (GAUZE/BANDAGES/DRESSINGS) ×1 IMPLANT
GLOVE BIO SURGEON STRL SZ8 (GLOVE) ×1 IMPLANT
GOWN STRL REUS W/ TWL LRG LVL3 (GOWN DISPOSABLE) ×1 IMPLANT
GOWN STRL REUS W/TWL LRG LVL3 (GOWN DISPOSABLE) ×1
GOWN STRL REUS W/TWL LRG LVL4 (GOWN DISPOSABLE) ×1 IMPLANT
KIT TURNOVER KIT A (KITS) ×1 IMPLANT
MANIFOLD NEPTUNE II (INSTRUMENTS) ×1 IMPLANT
NS IRRIG 500ML POUR BTL (IV SOLUTION) ×1 IMPLANT
PACK EXTREMITY ARMC (MISCELLANEOUS) ×1 IMPLANT
PAD CAST 4YDX4 CTTN HI CHSV (CAST SUPPLIES) IMPLANT
PAD PREP 24X41 OB/GYN DISP (PERSONAL CARE ITEMS) ×1 IMPLANT
PADDING CAST BLEND 4X4 STRL (MISCELLANEOUS) ×1 IMPLANT
PADDING CAST COTTON 4X4 STRL (CAST SUPPLIES) ×1
SPLINT CAST 1 STEP 3X12 (MISCELLANEOUS) ×1 IMPLANT
STOCKINETTE 48X4 2 PLY STRL (GAUZE/BANDAGES/DRESSINGS) ×1 IMPLANT
STOCKINETTE BIAS CUT 4 980044 (GAUZE/BANDAGES/DRESSINGS) ×1 IMPLANT
STOCKINETTE STRL 4IN 9604848 (GAUZE/BANDAGES/DRESSINGS) ×1 IMPLANT
STOCKINETTE STRL 6IN 960660 (GAUZE/BANDAGES/DRESSINGS) ×1 IMPLANT
SUT ETHILON 4-0 (SUTURE) ×1
SUT ETHILON 4-0 FS2 18XMFL BLK (SUTURE) ×1
SUT ETHILON 5-0 FS-2 18 BLK (SUTURE) ×1 IMPLANT
SUTURE ETHLN 4-0 FS2 18XMF BLK (SUTURE) ×1 IMPLANT
TRAP FLUID SMOKE EVACUATOR (MISCELLANEOUS) ×1 IMPLANT
WATER STERILE IRR 500ML POUR (IV SOLUTION) ×1 IMPLANT

## 2022-08-24 NOTE — Op Note (Signed)
08/24/2022  8:58 AM  PATIENT:  [name]    PRE-OPERATIVE DIAGNOSIS: LEFT CARPAL TUNNEL SYNDROME  POST-OPERATIVE DIAGNOSIS: LEFT CARPAL TUNNEL SYNDROME  PROCEDURE:  LEFT CARPAL TUNNEL RELEASE  SURGEON: Park Breed, MD  TOURNIQUET TIME: 25  MIN   ANESTHESIA:   General  PREOPERATIVE INDICATIONS:  [name] is a  67 y.o. male with a diagnosis of left carpal tunnel syndrome who failed conservative measures and elected for surgical management.    The risks benefits and alternatives were discussed with the patient preoperatively including but not limited to the risks of infection, bleeding, nerve injury, incomplete relief of symptoms, pillar pain, cardiopulmonary complications, the need for revision surgery, among others, and the patient was willing to proceed.  OPERATIVE FINDINGS: Thickened volar ligament and nerve compression.  OPERATIVE PROCEDURE: The patient is brought to the operating room placed in the supine position. General anesthesia was administered. The left upper extremity was prepped and draped in usual sterile fashion. Time out was performed. The arm was elevated and exsanguinated and the tourniquet was inflated. Incision was made in line with the radial border of the ring finger. The carpal tunnel transverse fascia was identified, cleaned, and incised sharply. The common sensory branches were visualized along with the superficial palmar arch and protected.  The median nerve was protected below. A Kelly clamp was  placed underneath the transverse carpal ligament, protecting the nerve. I released the ligament completely, and then released the proximal distal volar forearm fascia. The nerve was identified, and visualized, and protected throughout the case. The motor branch was intact upon inspection. No masses or abnormalities were identified in the ulnar bursa.  The wounds were irrigated copiously and the skin closed with nylon. The wound was injected with 1/2 % marcaine  followed by a sterile dressing and volar splint. Tourniquet was deflated with good return of blood flow to all fingers. Sponge and needle counts were correct.  The patient tolerated this well, with no complications. The patient was awakened and taken to recovery in good condition.

## 2022-08-24 NOTE — H&P (Signed)
THE PATIENT WAS SEEN PRIOR TO SURGERY TODAY.  HISTORY, ALLERGIES, HOME MEDICATIONS AND OPERATIVE PROCEDURE WERE REVIEWED. RISKS AND BENEFITS OF SURGERY DISCUSSED WITH PATIENT AGAIN.  NO CHANGES FROM INITIAL HISTORY AND PHYSICAL NOTED.    

## 2022-08-24 NOTE — Transfer of Care (Signed)
Immediate Anesthesia Transfer of Care Note  Patient: [name]  Procedure(s) Performed: CARPAL TUNNEL RELEASE (Left)  Patient Location: PACU  Anesthesia Type:General  Level of Consciousness: awake  Airway & Oxygen Therapy: Patient Spontanous Breathing  Post-op Assessment: Report given to RN and Post -op Vital signs reviewed and stable  Post vital signs: Reviewed and stable  Last Vitals:  Vitals Value Taken Time  BP 129/75 08/24/22 0857  Temp 36.1 C 08/24/22 0857  Pulse 81 08/24/22 0900  Resp 14 08/24/22 0900  SpO2 98 % 08/24/22 0900  Vitals shown include unvalidated device data.  Last Pain:  Vitals:   08/24/22 0857  TempSrc:   PainSc: Asleep         Complications: No notable events documented.

## 2022-08-24 NOTE — Anesthesia Preprocedure Evaluation (Signed)
Anesthesia Evaluation  Patient identified by MRN, date of birth, ID band Patient awake    Reviewed: Allergy & Precautions, H&P , NPO status , Patient's Chart, lab work & pertinent test results, reviewed documented beta blocker date and time   History of Anesthesia Complications Negative for: history of anesthetic complications  Airway Mallampati: III  TM Distance: >3 FB Neck ROM: full    Dental  (+) Dental Advidsory Given, Missing, Teeth Intact   Pulmonary neg pulmonary ROS   Pulmonary exam normal breath sounds clear to auscultation       Cardiovascular Exercise Tolerance: Good hypertension, (-) angina (-) Past MI and (-) Cardiac Stents Normal cardiovascular exam(-) dysrhythmias (-) Valvular Problems/Murmurs Rhythm:regular Rate:Normal     Neuro/Psych negative neurological ROS  negative psych ROS   GI/Hepatic Neg liver ROS,GERD  ,,  Endo/Other  diabetes, Well Controlled, Type 1, Insulin Dependent, Oral Hypoglycemic Agents    Renal/GU negative Renal ROS  negative genitourinary   Musculoskeletal   Abdominal   Peds  Hematology negative hematology ROS (+)   Anesthesia Other Findings Past Medical History: No date: Adenomatous polyp No date: Allergy No date: Anemia No date: Arthritis     Comment:  feet  04/02/2013: Bunion     Comment:  STATUS POST OP BUN REPAIR No date: Diabetes mellitus No date: GERD (gastroesophageal reflux disease) No date: Gout No date: Hammertoe No date: Hyperlipidemia No date: Hypertension No date: Neuromuscular disorder (HCC)     Comment:  neuropathy No date: Plantar flexed metatarsal No date: Psoriasis   Reproductive/Obstetrics negative OB ROS                             Anesthesia Physical Anesthesia Plan  ASA: 3  Anesthesia Plan: General   Post-op Pain Management:    Induction: Intravenous  PONV Risk Score and Plan: 2 and Ondansetron,  Dexamethasone, Treatment may vary due to age or medical condition and Midazolam  Airway Management Planned: LMA  Additional Equipment:   Intra-op Plan:   Post-operative Plan: Extubation in OR  Informed Consent: I have reviewed the patients History and Physical, chart, labs and discussed the procedure including the risks, benefits and alternatives for the proposed anesthesia with the patient or authorized representative who has indicated his/her understanding and acceptance.     Dental Advisory Given  Plan Discussed with: Anesthesiologist, CRNA and Surgeon  Anesthesia Plan Comments:        Anesthesia Quick Evaluation

## 2022-08-24 NOTE — Anesthesia Procedure Notes (Signed)
Procedure Name: LMA Insertion Date/Time: 08/24/2022 8:02 AM  Performed by: Cammie Sickle, CRNAPre-anesthesia Checklist: Patient identified, Patient being monitored, Timeout performed, Emergency Drugs available and Suction available Patient Re-evaluated:Patient Re-evaluated prior to induction Oxygen Delivery Method: Circle system utilized Preoxygenation: Pre-oxygenation with 100% oxygen Induction Type: IV induction Ventilation: Mask ventilation without difficulty LMA: LMA inserted LMA Size: 4.0 Tube type: Oral Number of attempts: 1 Placement Confirmation: positive ETCO2 and breath sounds checked- equal and bilateral Tube secured with: Tape Dental Injury: Teeth and Oropharynx as per pre-operative assessment

## 2022-08-24 NOTE — Discharge Instructions (Signed)
AMBULATORY SURGERY  ?DISCHARGE INSTRUCTIONS ? ? ?The drugs that you were given will stay in your system until tomorrow so for the next 24 hours you should not: ? ?Drive an automobile ?Make any legal decisions ?Drink any alcoholic beverage ? ? ?You may resume regular meals tomorrow.  Today it is better to start with liquids and gradually work up to solid foods. ? ?You may eat anything you prefer, but it is better to start with liquids, then soup and crackers, and gradually work up to solid foods. ? ? ?Please notify your doctor immediately if you have any unusual bleeding, trouble breathing, redness and pain at the surgery site, drainage, fever, or pain not relieved by medication. ? ? ? ?Additional Instructions: ? ? ? ?Please contact your physician with any problems or Same Day Surgery at [phone], Monday through Friday 6 am to 4 pm, or [facility] [place] Main number at [phone].  ?

## 2022-08-25 NOTE — Anesthesia Postprocedure Evaluation (Signed)
Anesthesia Post Note  Patient: [name]  Procedure(s) Performed: CARPAL TUNNEL RELEASE (Left)  Patient location during evaluation: PACU Anesthesia Type: General Level of consciousness: awake and alert Pain management: pain level controlled Vital Signs Assessment: post-procedure vital signs reviewed and stable Respiratory status: spontaneous breathing, nonlabored ventilation, respiratory function stable and patient connected to nasal cannula oxygen Cardiovascular status: blood pressure returned to baseline and stable Postop Assessment: no apparent nausea or vomiting Anesthetic complications: no   No notable events documented.   Last Vitals:  Vitals:   08/24/22 0915 08/24/22 0931  BP: (!) 150/77 (!) 152/90  Pulse: 82 88  Resp: 14 16  Temp: (!) 36.2 C 36.6 C  SpO2: 100% 98%    Last Pain:  Vitals:   08/24/22 0931  TempSrc: Temporal  PainSc: 0-No pain                 Martha Clan

## 2022-09-05 ENCOUNTER — Encounter: Payer: Self-pay | Admitting: Specialist

## 2022-09-18 ENCOUNTER — Other Ambulatory Visit: Payer: Self-pay | Admitting: Internal Medicine

## 2022-09-18 ENCOUNTER — Encounter: Payer: Self-pay | Admitting: Internal Medicine

## 2022-09-18 ENCOUNTER — Encounter: Payer: Self-pay | Admitting: Urology

## 2022-09-18 DIAGNOSIS — N411 Chronic prostatitis: Secondary | ICD-10-CM

## 2022-09-18 MED ORDER — TAMSULOSIN HCL 0.4 MG PO CAPS: 0.4000 mg | ORAL_CAPSULE | Freq: Every day | ORAL | 1 refills | Status: DC

## 2022-09-18 MED ORDER — FINASTERIDE 5 MG PO TABS: 5.0000 mg | ORAL_TABLET | Freq: Every day | ORAL | 1 refills | Status: DC

## 2022-09-18 MED ORDER — DULOXETINE HCL 60 MG PO CPEP: 60.0000 mg | ORAL_CAPSULE | Freq: Every day | ORAL | 3 refills | Status: DC

## 2022-09-18 MED ORDER — HYDROXYZINE PAMOATE 25 MG PO CAPS: 25.0000 mg | ORAL_CAPSULE | Freq: Three times a day (TID) | ORAL | 0 refills | Status: DC | PRN

## 2022-10-09 ENCOUNTER — Other Ambulatory Visit: Payer: Self-pay | Admitting: Dermatology

## 2022-10-09 DIAGNOSIS — L400 Psoriasis vulgaris: Secondary | ICD-10-CM

## 2022-10-11 ENCOUNTER — Other Ambulatory Visit: Payer: Self-pay

## 2022-10-11 DIAGNOSIS — L400 Psoriasis vulgaris: Secondary | ICD-10-CM

## 2022-10-11 MED ORDER — TALTZ 80 MG/ML ~~LOC~~ SOAJ: 80.0000 mg | SUBCUTANEOUS | 4 refills | Status: DC

## 2022-10-11 NOTE — Progress Notes (Signed)
RF request and scheduled patient follow up appointment. aw

## 2022-10-12 ENCOUNTER — Encounter: Payer: Self-pay | Admitting: Physician Assistant

## 2022-10-12 ENCOUNTER — Ambulatory Visit: Payer: 59 | Admitting: Physician Assistant

## 2022-10-12 VITALS — BP 152/92 | HR 112 | Ht 73.0 in | Wt 219.2 lb

## 2022-10-12 DIAGNOSIS — K219 Gastro-esophageal reflux disease without esophagitis: Secondary | ICD-10-CM

## 2022-10-12 DIAGNOSIS — R1310 Dysphagia, unspecified: Secondary | ICD-10-CM

## 2022-10-12 NOTE — Progress Notes (Signed)
Chief Complaint: Discuss EGD  HPI:    [name] is a 67 year old male with a past medical history as listed below including reflux, diabetes and multiple others, known to Dr. Loletha Carrow, who presents to clinic today to discuss an EGD.    06/28/2022 iron studies normal, CBC with a hemoglobin slightly decreased to 12    07/13/2022 colonoscopy for personal history of adenomatous polyps on a colonoscopy in January 2018 with a diminutive polyp in the proximal ascending colon, diverticulosis in the left and right colon otherwise normal.  Pathology showed benign lymphoid aggregate.  Repeat recommended for surveillance in 10 years.  Renal function panel with a glucose of 298 and otherwise normal.    Today, the patient presents  to clinic and tells me that he has been having a lot of trouble when he eats and feels like he gets "gagged", describes episodes where food feels like it gets stuck just after swallowing and he has had to make himself vomit on 2 occasions.  This has been happening for the past few months and seems more frequent over the past few weeks.  He has been on Omeprazole 20 mg twice daily chronically for years for reflux symptoms and really does not experience heartburn or reflux on a regular basis.  He did see an ENT about all of the symptoms and they tried him on Singulair which gave him nightmares so he discontinued it after 4 days.  Associated symptoms include that he feels like he gets "stopped up in his head" anytime that he eats and it does not seem to matter what it is.  Also some mild epigastric pain.    Denies fever, chills, weight loss, change in bowel habits, nausea or vomiting.  Past Medical History:  Diagnosis Date   Adenomatous polyp    Allergy    Anemia    Arthritis    feet    Bunion 04/02/2013   STATUS POST OP BUN REPAIR   Diabetes mellitus    GERD (gastroesophageal reflux disease)    Gout    Hammertoe    Hyperlipidemia    Hypertension    Neuromuscular disorder ([place])     neuropathy   Plantar flexed metatarsal    Psoriasis     Past Surgical History:  Procedure Laterality Date   CARPAL TUNNEL RELEASE Left 08/24/2022   Procedure: CARPAL TUNNEL RELEASE;  Surgeon: [name], MD;  Location: [facility] ORS;  Service: Orthopedics;  Laterality: Left;   COLONOSCOPY     FOOT AMPUTATION Right 01/11/2017   Dr Doreatha Lew ([facility])--transmetatarsal   FOOT SURGERY Right 02/28/2013   Shaune Leeks HAM TOE2,3 PINS ,2ND MET OSTEOTOMY, 5TH MET W/SCREW   02-2013, 04-2013   HIP FRACTURE SURGERY  1993   surgery x2   POLYPECTOMY     PROSTATE BIOPSY  10/10/2019   TOTAL HIP ARTHROPLASTY     Left hip replacement/femur fx 07/04, Screw removal left hip- [facility] 11/01    Current Outpatient Medications  Medication Sig Dispense Refill   Alpha-Lipoic Acid 600 MG TABS Take by mouth. 2 in am and 1 in pm     amLODipine (NORVASC) 5 MG tablet TAKE 1 TABLET(5 MG) BY MOUTH DAILY 90 tablet 3   aspirin 81 MG tablet Take 81 mg by mouth daily.     B-D UF III MINI PEN NEEDLES 31G X 5 MM MISC USE AS DIRECTED DAILY 90 each 3   CALCIUM-MAGNESIUM-ZINC PO Take by mouth.     Cinnamon 500 MG capsule Take  500 mg by mouth 2 (two) times daily.     CONTOUR NEXT TEST test strip USE 1 TO 2 TIMES DAILY TO  CHECK BLOOD SUGAR 200 strip 3   DULoxetine (CYMBALTA) 60 MG capsule Take 1 capsule (60 mg total) by mouth daily. 90 capsule 3   finasteride (PROSCAR) 5 MG tablet Take 1 tablet (5 mg total) by mouth daily. 90 tablet 1   furosemide (LASIX) 20 MG tablet Take 1 tablet (20 mg total) by mouth daily as needed. 90 tablet 3   gabapentin (NEURONTIN) 400 MG capsule Take 1 capsule (400 mg total) by mouth 3 (three) times daily. 60 capsule 3   HYDROcodone-acetaminophen (NORCO) 5-325 MG tablet Take 1-2 tablets by mouth every 6 (six) hours as needed. 50 tablet 0   hydrocortisone valerate ointment (WEST-CORT) 0.2 % APPLY TO AFFECTED AREA(S)  TOPICALLY TWICE DAILY 120 g 1   hydrOXYzine (VISTARIL) 25 MG capsule Take 1 capsule (25 mg total)  by mouth every 8 (eight) hours as needed for itching. 60 capsule 0   Ixekizumab (TALTZ) 80 MG/ML SOAJ Inject 80 mg into the skin every 28 (twenty-eight) days. For maintenance. 1 mL 4   ketoconazole (NIZORAL) 2 % shampoo Apply 1 Application topically as directed. Wash scalp with Ketoconazole shampoo qo shampoo, let sit for 5 minutes and rinse out 120 mL 11   LANTUS SOLOSTAR 100 UNIT/ML Solostar Pen INJECT SUBCUTANEOUSLY 22  UNITS DAILY 30 mL 3   losartan (COZAAR) 25 MG tablet TAKE 1 TABLET(25 MG) BY MOUTH DAILY 90 tablet 3   meloxicam (MOBIC) 15 MG tablet Take 1 tablet (15 mg total) by mouth daily. 30 tablet 3   metFORMIN (GLUCOPHAGE) 1000 MG tablet TAKE 1 TABLET BY MOUTH TWICE  DAILY WITH MEALS 180 tablet 3   omeprazole (PRILOSEC) 20 MG capsule TAKE 1 CAPSULE BY MOUTH TWICE  DAILY BEFORE MEALS 180 capsule 3   simvastatin (ZOCOR) 40 MG tablet TAKE 1 TABLET BY MOUTH AT  BEDTIME 90 tablet 3   tamsulosin (FLOMAX) 0.4 MG CAPS capsule Take 1 capsule (0.4 mg total) by mouth daily. 90 capsule 1   Tapinarof (VTAMA) 1 % CREA Apply 1 application topically daily. 60 g 2   Tapinarof (VTAMA) 1 % CREA Apply 1 Application topically daily. Qd to aa psoriasis on hands, feet, arms 60 g 11   triamcinolone lotion (KENALOG) 0.1 % APPLY TO AFFECTED AREA(S)  TOPICALLY 3 TIMES DAILY 180 mL 1   No current facility-administered medications for this visit.    Allergies as of 10/12/2022 - Review Complete 08/24/2022  Allergen Reaction Noted   Glipizide Other (See Comments) 09/03/2006   Lisinopril Other (See Comments) 04/15/2014   Ramipril Cough 09/03/2006   Ibuprofen  09/03/2006   Bactrim [sulfamethoxazole-trimethoprim] Nausea And Vomiting and Rash 06/19/2022    Family History  Problem Relation Age of Onset   Diabetes Mother    Diabetes Brother    Alzheimer's disease Maternal Aunt    Alzheimer's disease Cousin    Cancer Neg Hx    Colon cancer Neg Hx    Colon polyps Neg Hx    Rectal cancer Neg Hx    Stomach  cancer Neg Hx    Esophageal cancer Neg Hx     Social History   Socioeconomic History   Marital status: Married    Spouse name: Not on file   Number of children: 3   Years of education: Not on file   Highest education level: Not on file  Occupational  History   Occupation: PC ADMINISTRATOR    Employer: [employer]  Tobacco Use   Smoking status: Never    Passive exposure: Past   Smokeless tobacco: Never  Vaping Use   Vaping Use: Never used  Substance and Sexual Activity   Alcohol use: Yes    Alcohol/week: 1.0 standard drink of alcohol    Types: 1 Standard drinks or equivalent per week    Comment: occasional (DWI 1991)   Drug use: No   Sexual activity: Yes    Birth control/protection: None  Other Topics Concern   Not on file  Social History Narrative    Works for [employer]; alcohol 2-3 times a week; never smoked; lives in [place]. With wife; and 2 dog.     Social Determinants of Health   Financial Resource Strain: Not on file  Food Insecurity: Not on file  Transportation Needs: Not on file  Physical Activity: Not on file  Stress: Not on file  Social Connections: Not on file  Intimate Partner Violence: Not on file    Review of Systems:    Constitutional: No weight loss, fever or chills Cardiovascular: No chest pain   Respiratory: No SOB  Gastrointestinal: See HPI and otherwise negative   Physical Exam:  Vital signs: BP (!) 152/92 (BP Location: Left Arm, Patient Position: Sitting, Cuff Size: Normal)   Pulse (!) 112   Ht 6\' 1"  (1.854 m)   Wt 219 lb 4 oz (99.5 kg)   SpO2 96%   BMI 28.93 kg/m    Constitutional:   Pleasant Caucasian male appears to be in NAD, Well developed, Well nourished, alert and cooperative Respiratory: Respirations even and unlabored. Lungs clear to auscultation bilaterally.   No wheezes, crackles, or rhonchi.  Cardiovascular: Normal S1, S2. No MRG. Regular rate and rhythm. No peripheral edema, cyanosis or pallor.   Gastrointestinal:  Soft, nondistended, mild epigastric ttp, No rebound or guarding. Normal bowel sounds. No appreciable masses or hepatomegaly. Rectal:  Not performed.  Psychiatric: Oriented to person, place and time. Demonstrates good judgement and reason without abnormal affect or behaviors.  RELEVANT LABS AND IMAGING: CBC    Component Value Date/Time   WBC 6.6 06/28/2022 1116   RBC 3.56 (L) 06/28/2022 1116   HGB 12.0 (L) 06/28/2022 1116   HCT 34.6 (L) 06/28/2022 1116   PLT 420 06/28/2022 1116   MCV 97 06/28/2022 1116   MCH 33.7 (H) 06/28/2022 1116   MCHC 34.7 06/28/2022 1116   RDW 11.8 06/28/2022 1116   LYMPHSABS 1.5 03/31/2021 0707   EOSABS 0.3 03/31/2021 0707   BASOSABS 0.1 03/31/2021 0707    CMP     Component Value Date/Time   NA 134 06/28/2022 1116   K 4.3 06/28/2022 1116   CL 93 (L) 06/28/2022 1116   CO2 25 06/28/2022 1116   GLUCOSE 298 (H) 06/28/2022 1116   BUN 12 06/28/2022 1116   CREATININE 1.04 06/28/2022 1116   CREATININE 1.23 01/03/2013 1212   CALCIUM 9.6 06/28/2022 1116   PROT 6.4 06/19/2022 1518   ALBUMIN 4.3 06/28/2022 1116   AST 35 06/19/2022 1518   ALT 20 06/19/2022 1518   ALKPHOS 76 06/19/2022 1518   BILITOT 0.5 06/19/2022 1518   GFRNONAA 82 03/25/2020 0755   GFRNONAA >60 01/03/2013 1212   GFRAA 95 03/25/2020 0755   GFRAA >60 01/03/2013 1212    Assessment: 1.  Dysphagia: Increase in symptoms over the past 2 months with solid food getting stuck and occasional episodes of regurgitation;  likely stricture versus ring versus other 2.  GERD: Chronic for the patient, typically maintained on Omeprazole 20 mg twice daily for the past many years, no prior EGD  Plan: 1.  Scheduled patient for an EGD with dilation in the [place] with Dr. Loletha Carrow.  Did provide the patient a detailed list risks for the procedure and he agrees to proceed. Patient is appropriate for endoscopic procedure(s) in the ambulatory ([place]) setting.  2.  Discussed with patient that we could  increase his Omeprazole to 40 mg twice daily which would maximize his therapy but he is worried about doing this.  Tells me he will do it if Dr. Loletha Carrow recommends.  For now we will keep him on 20 mg twice daily.  He gets this prescription from his PCP and has enough medicine at home. 3.  Reviewed anti-dysphagia measures including taking small bites, chewing well, avoiding distraction while eating and the chin tuck technique. 4.  Patient to follow in clinic per recommendations from Dr. Loletha Carrow after time of procedure.  Bradley Newer, PA-C [facility] 10/12/2022, 9:08 AM  Cc: Venia Carbon, MD

## 2022-10-12 NOTE — Patient Instructions (Addendum)
_______________________________________________________  If your blood pressure at your visit was 140/90 or greater, please contact your primary care physician to follow up on this.  _______________________________________________________  If you are age 67 or older, your body mass index should be between 23-30. Your Body mass index is 28.93 kg/m. If this is out of the aforementioned range listed, please consider follow up with your Primary Care Provider.  If you are age 84 or younger, your body mass index should be between 19-25. Your Body mass index is 28.93 kg/m. If this is out of the aformentioned range listed, please consider follow up with your Primary Care Provider.   ________________________________________________________  The [facility] providers would like to encourage you to use [facility] to communicate with providers for non-urgent requests or questions.  Due to long hold times on the telephone, sending your provider a message by [facility] may be a faster and more efficient way to get a response.  Please allow 48 business hours for a response.  Please remember that this is for non-urgent requests.  _______________________________________________________  Dennis Bast have been scheduled for an endoscopy. Please follow written instructions given to you at your visit today. If you use inhalers (even only as needed), please bring them with you on the day of your procedure.  Continue omeprazole 20mg  2 times a day   It was a pleasure to see you today!  Thank you for trusting me with your gastrointestinal care!

## 2022-10-12 NOTE — Progress Notes (Signed)
____________________________________________________________  Attending physician addendum:  Thank you for sending this case to me. I have reviewed the entire note and agree with the plan.  Not entirely clear if this is an esophageal or a pharyngeal cause. If EGD unrevealing, barium study may be needed.  Wilfrid Lund, MD  ____________________________________________________________

## 2022-10-25 ENCOUNTER — Inpatient Hospital Stay: Payer: 59 | Admitting: Internal Medicine

## 2022-10-26 ENCOUNTER — Encounter: Payer: Self-pay | Admitting: Internal Medicine

## 2022-10-31 ENCOUNTER — Encounter: Payer: Self-pay | Admitting: Internal Medicine

## 2022-10-31 ENCOUNTER — Inpatient Hospital Stay: Payer: 59 | Attending: Internal Medicine | Admitting: Internal Medicine

## 2022-10-31 VITALS — BP 162/86 | HR 97 | Resp 18 | Ht 73.0 in | Wt 222.0 lb

## 2022-10-31 DIAGNOSIS — R5383 Other fatigue: Secondary | ICD-10-CM | POA: Insufficient documentation

## 2022-10-31 DIAGNOSIS — Z881 Allergy status to other antibiotic agents status: Secondary | ICD-10-CM | POA: Insufficient documentation

## 2022-10-31 DIAGNOSIS — R350 Frequency of micturition: Secondary | ICD-10-CM | POA: Diagnosis not present

## 2022-10-31 DIAGNOSIS — Z886 Allergy status to analgesic agent status: Secondary | ICD-10-CM | POA: Insufficient documentation

## 2022-10-31 DIAGNOSIS — D649 Anemia, unspecified: Secondary | ICD-10-CM | POA: Diagnosis present

## 2022-10-31 DIAGNOSIS — E538 Deficiency of other specified B group vitamins: Secondary | ICD-10-CM | POA: Insufficient documentation

## 2022-10-31 DIAGNOSIS — Z833 Family history of diabetes mellitus: Secondary | ICD-10-CM | POA: Insufficient documentation

## 2022-10-31 DIAGNOSIS — Z79899 Other long term (current) drug therapy: Secondary | ICD-10-CM | POA: Insufficient documentation

## 2022-10-31 DIAGNOSIS — Z818 Family history of other mental and behavioral disorders: Secondary | ICD-10-CM | POA: Diagnosis not present

## 2022-10-31 DIAGNOSIS — L409 Psoriasis, unspecified: Secondary | ICD-10-CM | POA: Diagnosis not present

## 2022-10-31 DIAGNOSIS — Z888 Allergy status to other drugs, medicaments and biological substances status: Secondary | ICD-10-CM | POA: Diagnosis not present

## 2022-10-31 MED ORDER — "BD SAFETYGLIDE SYRINGE/NEEDLE 25G X 1"" 3 ML MISC": 1 refills | Status: AC

## 2022-10-31 MED ORDER — CYANOCOBALAMIN 1000 MCG/ML IJ SOLN: INTRAMUSCULAR | 1 refills | Status: DC

## 2022-10-31 NOTE — Progress Notes (Signed)
Patient states that his b12 is really low. Patient having feet pain due to psoriasis.

## 2022-10-31 NOTE — Assessment & Plan Note (Addendum)
#   Anemia- mild Hb 12; normocytic [since MAY 2023]-e DEC 2023-  Iron studies; ferritin no evidence of iron deficiency; myeloma panel kappa lambda light chain; hepatitis screening; LDH haptoglobin; CRP-within normal limit.  Peripheral smear no evidence of schistocytes.  Possible B12 deficiency-see below  # B12 deficiency- 141 [March 2024-; [facility]]-prescription sent for B12 injections weekly x 3 more; and then once a month.  If not improved then consider further workup including bone marrow biopsy.  Check methylmalonic acid and autoimmune antibodies.  # Psoriasis- on TNF blocker-TALTZ [Dr.Kowalski]-stable/improved.  # DISPOSITION:    # [facility]-methylmalonic acid antiparietal antibodies; intrinsic factor antibodies- # follow up in 3 months- MD; 1 week prior-[facility] requistion cbc/bmp; B12 levels-Dr.B

## 2022-10-31 NOTE — Progress Notes (Signed)
[facility] CONSULT NOTE  Patient Care Team: Karie Schwalbe, MD as PCP - General  CHIEF COMPLAINTS/PURPOSE OF CONSULTATION: ANEMIA   HEMATOLOGY HISTORY  # SEP 2023- ANEMIA MCV-platelets WBC-WNL; Iron sat; ferritin;  GFR- CT/US; EGD- in JAn 2024 /colonoscopy-dec 21st, 2023; prior 3-4 year [Dr.[facility]]- DEC 2023-  Iron studies; ferritin no evidence of iron deficiency; myeloma panel kappa lambda light chain; hepatitis screening; LDH haptoglobin; CRP-within normal limit.  Peripheral smear no evidence of schistocytes. APRIl 2024- B12 141. Started B12 injections   #    Latest Reference Range & Units Most Recent 12/14/21 10:08 03/29/22 13:12 06/19/22 15:18 06/28/22 11:16  Hemoglobin 13.0 - 17.7 g/dL 84.6 (L) 96/2/95 28:41 13.4 12.2 (L) 11.9 (L) 12.0 (L)  (L): Data is abnormally low  Latest Reference Range & Units 06/28/22 11:16  Iron 38 - 169 ug/dL 98  UIBC 324 - 401 ug/dL 027  TIBC 253 - 664 ug/dL 403  Ferritin 30 - 474 ng/mL 143  Iron Saturation 15 - 55 % 31    # Psoraiss [Dx- 18 years]- 2023- started TNF   HISTORY OF PRESENTING ILLNESS: Accompanied by his wife ambulating independently.   [name] 67 y.o.  male pleasant patient with mild anemia without any obvious cause is here for follow-up.  Patient states that he got a B12 injection last week.  Patient complains of ongoing fatigue.  Otherwise denies any blood in stools or black or stools.   Review of Systems  Constitutional:  Positive for malaise/fatigue. Negative for chills, diaphoresis, fever and weight loss.  HENT:  Negative for nosebleeds and sore throat.   Eyes:  Negative for double vision.  Respiratory:  Negative for cough, hemoptysis, sputum production, shortness of breath and wheezing.   Cardiovascular:  Negative for chest pain, palpitations, orthopnea and leg swelling.  Gastrointestinal:  Negative for abdominal pain, blood in stool, constipation, diarrhea, heartburn, melena, nausea and  vomiting.  Genitourinary:  Positive for frequency. Negative for dysuria and urgency.  Musculoskeletal:  Negative for back pain and joint pain.  Skin: Negative.  Negative for itching and rash.  Neurological:  Negative for dizziness, tingling, focal weakness, weakness and headaches.  Endo/Heme/Allergies:  Does not bruise/bleed easily.  Psychiatric/Behavioral:  Negative for depression. The patient is not nervous/anxious and does not have insomnia.      MEDICAL HISTORY:  Past Medical History:  Diagnosis Date   Adenomatous polyp    Allergy    Anemia    Arthritis    feet    Bunion 04/02/2013   STATUS POST OP BUN REPAIR   Diabetes mellitus    GERD (gastroesophageal reflux disease)    Gout    Hammertoe    Hyperlipidemia    Hypertension    Neuromuscular disorder    neuropathy   Plantar flexed metatarsal    Psoriasis     SURGICAL HISTORY: Past Surgical History:  Procedure Laterality Date   CARPAL TUNNEL RELEASE Left 08/24/2022   Procedure: CARPAL TUNNEL RELEASE;  Surgeon: Deeann Saint, MD;  Location: [facility] ORS;  Service: Orthopedics;  Laterality: Left;   COLONOSCOPY     FOOT AMPUTATION Right 01/11/2017   Dr Deborah Chalk ([facility])--transmetatarsal   FOOT SURGERY Right 02/28/2013   Arta Bruce HAM TOE2,3 PINS ,2ND MET OSTEOTOMY, 5TH MET W/SCREW   02-2013, 04-2013   HIP FRACTURE SURGERY  1993   surgery x2   POLYPECTOMY     PROSTATE BIOPSY  10/10/2019   TOTAL HIP ARTHROPLASTY  Left hip replacement/femur fx 07/04, Screw removal left hip- [facility] 11/01    SOCIAL HISTORY: Social History   Socioeconomic History   Marital status: Married    Spouse name: Not on file   Number of children: 3   Years of education: Not on file   Highest education level: Not on file  Occupational History   Occupation: PC ADMINISTRATOR    Employer: [employer]  Tobacco Use   Smoking status: Never    Passive exposure: Past   Smokeless tobacco: Never  Vaping Use   Vaping Use: Never used  Substance and Sexual  Activity   Alcohol use: Yes    Alcohol/week: 1.0 standard drink of alcohol    Types: 1 Standard drinks or equivalent per week    Comment: occasional (DWI 1991)   Drug use: No   Sexual activity: Yes    Birth control/protection: None  Other Topics Concern   Not on file  Social History Narrative    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 With wife; and 2 dog.     Social Determinants of Health   Financial Resource Strain: Not on file  Food Insecurity: Not on file  Transportation Needs: Not on file  Physical Activity: Not on file  Stress: Not on file  Social Connections: Not on file  Intimate Partner Violence: Not on file    FAMILY HISTORY: Family History  Problem Relation Age of Onset   Diabetes Mother    Diabetes Brother    Alzheimer's disease Maternal Aunt    Alzheimer's disease Cousin    Cancer Neg Hx    Colon cancer Neg Hx    Colon polyps Neg Hx    Rectal cancer Neg Hx    Stomach cancer Neg Hx    Esophageal cancer Neg Hx     ALLERGIES:  is allergic to glipizide, lisinopril, ramipril, ibuprofen, and bactrim [sulfamethoxazole-trimethoprim].  MEDICATIONS:  Current Outpatient Medications  Medication Sig Dispense Refill   Alpha-Lipoic Acid 600 MG TABS Take by mouth. 2 in am and 1 in pm     amLODipine (NORVASC) 5 MG tablet TAKE 1 TABLET(5 MG) BY MOUTH DAILY 90 tablet 3   aspirin 81 MG tablet Take 81 mg by mouth daily.     B-D UF III MINI PEN NEEDLES 31G X 5 MM MISC USE AS DIRECTED DAILY 90 each 3   CALCIUM-MAGNESIUM-ZINC PO Take by mouth.     Cinnamon 500 MG capsule Take 500 mg by mouth 2 (two) times daily.     CONTOUR NEXT TEST test strip USE 1 TO 2 TIMES DAILY TO  CHECK BLOOD SUGAR 200 strip 3   cyanocobalamin (VITAMIN B12) 1000 MCG/ML injection 1 ml injection- Weekly x 3; and then once a month. 12 mL 1   DULoxetine (CYMBALTA) 60 MG capsule Take 1 capsule (60 mg total) by mouth daily. 90 capsule 3   finasteride  (PROSCAR) 5 MG tablet Take 1 tablet (5 mg total) by mouth daily. 90 tablet 1   furosemide (LASIX) 20 MG tablet Take 1 tablet (20 mg total) by mouth daily as needed. 90 tablet 3   gabapentin (NEURONTIN) 400 MG capsule Take 1 capsule (400 mg total) by mouth 3 (three) times daily. 60 capsule 3   hydrocortisone valerate ointment (WEST-CORT) 0.2 % APPLY TO AFFECTED AREA(S)  TOPICALLY TWICE DAILY 120 g 1   hydrOXYzine (VISTARIL) 25 MG capsule Take 1 capsule (25 mg total) by mouth every 8 (  eight) hours as needed for itching. 60 capsule 0   Ixekizumab (TALTZ) 80 MG/ML SOAJ Inject 80 mg into the skin every 28 (twenty-eight) days. For maintenance. 1 mL 4   LANTUS SOLOSTAR 100 UNIT/ML Solostar Pen INJECT SUBCUTANEOUSLY 22  UNITS DAILY 30 mL 3   losartan (COZAAR) 25 MG tablet TAKE 1 TABLET(25 MG) BY MOUTH DAILY 90 tablet 3   metFORMIN (GLUCOPHAGE) 1000 MG tablet TAKE 1 TABLET BY MOUTH TWICE  DAILY WITH MEALS 180 tablet 3   omeprazole (PRILOSEC) 20 MG capsule TAKE 1 CAPSULE BY MOUTH TWICE  DAILY BEFORE MEALS 180 capsule 3   simvastatin (ZOCOR) 40 MG tablet TAKE 1 TABLET BY MOUTH AT  BEDTIME 90 tablet 3   SYRINGE-NEEDLE, DISP, 3 ML (BD SAFETYGLIDE SYRINGE/NEEDLE) 25G X 1" 3 ML MISC Use the needle for IM injection once a  month; or as directed. 30 each 1   tamsulosin (FLOMAX) 0.4 MG CAPS capsule Take 1 capsule (0.4 mg total) by mouth daily. 90 capsule 1   Tapinarof (VTAMA) 1 % CREA Apply 1 application topically daily. 60 g 2   Tapinarof (VTAMA) 1 % CREA Apply 1 Application topically daily. Qd to aa psoriasis on hands, feet, arms 60 g 11   triamcinolone lotion (KENALOG) 0.1 % APPLY TO AFFECTED AREA(S)  TOPICALLY 3 TIMES DAILY 180 mL 1   HYDROcodone-acetaminophen (NORCO) 5-325 MG tablet Take 1-2 tablets by mouth every 6 (six) hours as needed. (Patient not taking: Reported on 10/12/2022) 50 tablet 0   ketoconazole (NIZORAL) 2 % shampoo Apply 1 Application topically as directed. Wash scalp with Ketoconazole shampoo qo  shampoo, let sit for 5 minutes and rinse out (Patient not taking: Reported on 10/12/2022) 120 mL 11   meloxicam (MOBIC) 15 MG tablet Take 1 tablet (15 mg total) by mouth daily. (Patient not taking: Reported on 10/12/2022) 30 tablet 3   No current facility-administered medications for this visit.     Marland Kitchen.  PHYSICAL EXAMINATION:   Vitals:   10/31/22 1328  BP: (!) 162/86  Pulse: 97  Resp: 18  SpO2: 98%   Filed Weights   10/31/22 1324  Weight: 222 lb (100.7 kg)    Physical Exam Vitals and nursing note reviewed.  HENT:     Head: Normocephalic and atraumatic.     Mouth/Throat:     Pharynx: Oropharynx is clear.  Eyes:     Extraocular Movements: Extraocular movements intact.     Pupils: Pupils are equal, round, and reactive to light.  Cardiovascular:     Rate and Rhythm: Normal rate and regular rhythm.  Abdominal:     Palpations: Abdomen is soft.  Musculoskeletal:        General: Normal range of motion.     Cervical back: Normal range of motion.  Skin:    General: Skin is warm.  Neurological:     General: No focal deficit present.     Mental Status: He is alert and oriented to person, place, and time.  Psychiatric:        Behavior: Behavior normal.        Judgment: Judgment normal.      LABORATORY DATA:  I have reviewed the data as listed Lab Results  Component Value Date   WBC 6.6 06/28/2022   HGB 12.0 (L) 06/28/2022   HCT 34.6 (L) 06/28/2022   MCV 97 06/28/2022   PLT 420 06/28/2022   Recent Labs    12/14/21 1008 03/29/22 1312 06/19/22 1518 06/23/22 0954 06/28/22 1116  NA 137 136 124* 127* 134  K 5.3* 5.0 5.5* 4.6 4.3  CL 95* 95* 87* 86* 93*  CO2 26 19* 20 22 25   GLUCOSE 162* 159* 133* 163* 298*  BUN 6* 8 8 10 12   CREATININE 1.06 1.07 1.11 1.26 1.04  CALCIUM 9.3 9.5 9.0 9.5 9.6  PROT 6.9 6.4 6.4  --   --   ALBUMIN 4.4 4.4 4.5 4.5 4.3  AST 33 33 35  --   --   ALT 23 24 20   --   --   ALKPHOS 62 92 76  --   --   BILITOT 0.5 0.4 0.5  --   --      No  results found.  ASSESSMENT & PLAN:   Symptomatic anemia # Anemia- mild Hb 12; normocytic [since MAY 2023]-e DEC 2023-  Iron studies; ferritin no evidence of iron deficiency; myeloma panel kappa lambda light chain; hepatitis screening; LDH haptoglobin; CRP-within normal limit.  Peripheral smear no evidence of schistocytes.  Possible B12 deficiency-see below  # B12 deficiency- 141 [March 2024-; [facility]]-prescription sent for B12 injections weekly x 3 more; and then once a month.  If not improved then consider further workup including bone marrow biopsy.  Check methylmalonic acid and autoimmune antibodies.  # Psoriasis- on TNF blocker-TALTZ [Dr.Kowalski]-stable/improved.  # DISPOSITION:    # [facility]-methylmalonic acid antiparietal antibodies; intrinsic factor antibodies- # follow up in 3 months- MD; 1 week prior-[facility] requistion cbc/bmp; B12 levels-Dr.B    All questions were answered. The patient knows to call the clinic with any problems, questions or concerns.  Earna Coder, MD 10/31/2022 1:58 PM

## 2022-11-02 ENCOUNTER — Ambulatory Visit (AMBULATORY_SURGERY_CENTER): Payer: 59 | Admitting: Gastroenterology

## 2022-11-02 ENCOUNTER — Encounter: Payer: Self-pay | Admitting: Gastroenterology

## 2022-11-02 VITALS — BP 155/80 | HR 83 | Temp 97.3°F | Resp 16 | Ht 73.0 in | Wt 219.0 lb

## 2022-11-02 DIAGNOSIS — K222 Esophageal obstruction: Secondary | ICD-10-CM

## 2022-11-02 DIAGNOSIS — R1314 Dysphagia, pharyngoesophageal phase: Secondary | ICD-10-CM

## 2022-11-02 MED ORDER — SODIUM CHLORIDE 0.9 % IV SOLN: 500.0000 mL | Freq: Once | INTRAVENOUS | Status: DC

## 2022-11-02 NOTE — Progress Notes (Signed)
Called to room to assist during endoscopic procedure.  Patient ID and intended procedure confirmed with present staff. Received instructions for my participation in the procedure from the performing physician.  

## 2022-11-02 NOTE — Op Note (Signed)
[facility] Patient Name: [name] Procedure Date: 11/02/2022 10:11 AM MRN: [id number] Endoscopist: Sherilyn Cooter L. Myrtie Neither , MD, [id number] Age: 67 Referring MD:  Date of Birth: 1956-07-02 Gender: Male Account #: [id number] Procedure:                Upper GI endoscopy Indications:              Pharyngeal-esophageal phase dysphagia                           other symptoms as described in recent office                            consult note Medicines:                Monitored Anesthesia Care Procedure:                Pre-Anesthesia Assessment:                           - Prior to the procedure, a History and Physical                            was performed, and patient medications and                            allergies were reviewed. The patient's tolerance of                            previous anesthesia was also reviewed. The risks                            and benefits of the procedure and the sedation                            options and risks were discussed with the patient.                            All questions were answered, and informed consent                            was obtained. Prior Anticoagulants: The patient has                            taken no anticoagulant or antiplatelet agents. ASA                            Grade Assessment: III - A patient with severe                            systemic disease. After reviewing the risks and                            benefits, the patient was deemed in satisfactory  condition to undergo the procedure.                           After obtaining informed consent, the endoscope was                            passed under direct vision. Throughout the                            procedure, the patient's blood pressure, pulse, and                            oxygen saturations were monitored continuously. The                            GIF [id number] #[id number] was introduced through the                             mouth, and advanced to the second part of duodenum.                            The upper GI endoscopy was accomplished without                            difficulty. The patient tolerated the procedure                            well. Scope In: Scope Out: Findings:                 The larynx was normal.                           A widely patent Schatzki ring was found in the                            lower third of the esophagus. This was biopsied                            with a cold forceps for Ring disruption-no tissue                            sent to pathology.                           The exam of the esophagus was otherwise normal. No                            esophagitis or other mucosal abnormality, no mass                            or luminal dilatation. Active motility seen. No                            resistance passing the scope through the  UES or EGJ.                           The stomach was normal.                           The cardia and gastric fundus were normal on                            retroflexion.                           The examined duodenum was normal. Complications:            No immediate complications. Estimated Blood Loss:     Estimated blood loss was minimal. Impression:               - Normal larynx.                           - Widely patent Schatzki ring. Biopsied.                           - Normal stomach.                           - Normal examined duodenum.                           The visualized Schatzki ring is causing minimal                            reduction of the esophageal diameter. Given that                            and patient's reported symptoms, it is not felt                            likely to be contributing to dysphagia. Recommendation:           - Patient has a contact number available for                            emergencies. The signs and symptoms of potential                            delayed  complications were discussed with the                            patient. Return to normal activities tomorrow.                            Written discharge instructions were provided to the                            patient.                           -  Resume previous diet.                           - Continue present medications.                           - Return to ENT physician for symptoms suggestive                            of sinus congestion and allergic-trigger cough.                            Consider consultation with allergist at ENT                            discretion.                           - Perform a barium swallow using barium in liquid                            and tablet form at appointment to be scheduled. Alainna Stawicki L. Myrtie Neitheranis, MD 11/02/2022 10:33:22 AM This report has been signed electronically.

## 2022-11-02 NOTE — Patient Instructions (Addendum)
Resume previous diet Continue present medications Recommendations: Return to ENT physician for symptoms suggestive of sinus congestions and allergic trigger cough.  Consider consultation with allergist at ENT discretion Perform a barium swallow using barium in liquid and tablet form at appt to be scheduled, Dr Irving Burton office will call to schedule this Handouts/information given for Schatzki Ring  YOU HAD AN ENDOSCOPIC PROCEDURE TODAY AT THE [facility]:   Refer to the procedure report that was given to you for any specific questions about what was found during the examination.  If the procedure report does not answer your questions, please call your gastroenterologist to clarify.  If you requested that your care partner not be given the details of your procedure findings, then the procedure report has been included in a sealed envelope for you to review at your convenience later.  YOU SHOULD EXPECT: Some feelings of bloating in the abdomen. Passage of more gas than usual.  Walking can help get rid of the air that was put into your GI tract during the procedure and reduce the bloating. If you had a lower endoscopy (such as a colonoscopy or flexible sigmoidoscopy) you may notice spotting of blood in your stool or on the toilet paper. If you underwent a bowel prep for your procedure, you may not have a normal bowel movement for a few days.  Please Note:  You might notice some irritation and congestion in your nose or some drainage.  This is from the oxygen used during your procedure.  There is no need for concern and it should clear up in a day or so.  SYMPTOMS TO REPORT IMMEDIATELY:  Following upper endoscopy (EGD)  Vomiting of blood or coffee ground material  New chest pain or pain under the shoulder blades  Painful or persistently difficult swallowing  New shortness of breath  Fever of 100F or higher  Black, tarry-looking stools  For urgent or emergent issues, a gastroenterologist  can be reached at any hour by calling [phone]. Do not use MyChart messaging for urgent concerns.   DIET:  We do recommend a small meal at first, but then you may proceed to your regular diet.  Drink plenty of fluids but you should avoid alcoholic beverages for 24 hours.  ACTIVITY:  You should plan to take it easy for the rest of today and you should NOT DRIVE or use heavy machinery until tomorrow (because of the sedation medicines used during the test).    FOLLOW UP: Our staff will call the number listed on your records the next business day following your procedure.  We will call around 7:15- 8:00 am to check on you and address any questions or concerns that you may have regarding the information given to you following your procedure. If we do not reach you, we will leave a message.     If any biopsies were taken you will be contacted by phone or by letter within the next 1-3 weeks.  Please call us at [phone] if you have not heard about the biopsies in 3 weeks.    SIGNATURES/CONFIDENTIALITY: You and/or your care partner have signed paperwork which will be entered into your electronic medical record.  These signatures attest to the fact that that the information above on your After Visit Summary has been reviewed and is understood.  Full responsibility of the confidentiality of this discharge information lies with you and/or your care-partner.

## 2022-11-02 NOTE — Progress Notes (Signed)
No changes to clinical history since GI office visit on 10/12/22.  The patient is appropriate for an endoscopic procedure in the ambulatory setting.  - Amada Jupiter, MD

## 2022-11-02 NOTE — Progress Notes (Signed)
Pt awake, alert and oriented. VSS. Airway intact. SBAR complete to RN. All questions answered.   

## 2022-11-03 ENCOUNTER — Telehealth: Payer: Self-pay | Admitting: *Deleted

## 2022-11-03 ENCOUNTER — Other Ambulatory Visit: Payer: Self-pay

## 2022-11-03 DIAGNOSIS — K222 Esophageal obstruction: Secondary | ICD-10-CM

## 2022-11-03 DIAGNOSIS — R1310 Dysphagia, unspecified: Secondary | ICD-10-CM

## 2022-11-03 NOTE — Telephone Encounter (Signed)
  Follow up Call-     11/02/2022    9:46 AM 07/13/2022    8:44 AM  Call back number  Post procedure Call Back phone  # [phone]  Permission to leave phone message Yes Yes   LMOM

## 2022-11-07 ENCOUNTER — Encounter: Payer: Self-pay | Admitting: Internal Medicine

## 2022-11-10 ENCOUNTER — Ambulatory Visit (HOSPITAL_COMMUNITY)
Admission: RE | Admit: 2022-11-10 | Discharge: 2022-11-10 | Disposition: A | Discharge status: Home / Self Care | Payer: 59 | Source: Ambulatory Visit | Attending: Gastroenterology | Admitting: Gastroenterology

## 2022-11-10 DIAGNOSIS — K222 Esophageal obstruction: Secondary | ICD-10-CM | POA: Diagnosis present

## 2022-11-10 DIAGNOSIS — R1310 Dysphagia, unspecified: Secondary | ICD-10-CM | POA: Diagnosis present

## 2022-12-04 ENCOUNTER — Other Ambulatory Visit: Payer: Self-pay | Admitting: Internal Medicine

## 2022-12-06 ENCOUNTER — Ambulatory Visit: Payer: 59 | Admitting: Dermatology

## 2022-12-20 ENCOUNTER — Ambulatory Visit (INDEPENDENT_AMBULATORY_CARE_PROVIDER_SITE_OTHER): Payer: 59 | Admitting: Internal Medicine

## 2022-12-20 ENCOUNTER — Encounter: Payer: Self-pay | Admitting: Internal Medicine

## 2022-12-20 VITALS — BP 134/72 | HR 98 | Temp 98.2°F | Ht 71.5 in | Wt 217.0 lb

## 2022-12-20 DIAGNOSIS — N401 Enlarged prostate with lower urinary tract symptoms: Secondary | ICD-10-CM

## 2022-12-20 DIAGNOSIS — R972 Elevated prostate specific antigen [PSA]: Secondary | ICD-10-CM

## 2022-12-20 DIAGNOSIS — E538 Deficiency of other specified B group vitamins: Secondary | ICD-10-CM

## 2022-12-20 DIAGNOSIS — E1140 Type 2 diabetes mellitus with diabetic neuropathy, unspecified: Secondary | ICD-10-CM

## 2022-12-20 DIAGNOSIS — Z0000 Encounter for general adult medical examination without abnormal findings: Secondary | ICD-10-CM | POA: Diagnosis not present

## 2022-12-20 DIAGNOSIS — N400 Enlarged prostate without lower urinary tract symptoms: Secondary | ICD-10-CM | POA: Insufficient documentation

## 2022-12-20 DIAGNOSIS — Z794 Long term (current) use of insulin: Secondary | ICD-10-CM

## 2022-12-20 DIAGNOSIS — I10 Essential (primary) hypertension: Secondary | ICD-10-CM

## 2022-12-20 DIAGNOSIS — Z89431 Acquired absence of right foot: Secondary | ICD-10-CM

## 2022-12-20 DIAGNOSIS — Z23 Encounter for immunization: Secondary | ICD-10-CM | POA: Diagnosis not present

## 2022-12-20 DIAGNOSIS — R338 Other retention of urine: Secondary | ICD-10-CM

## 2022-12-20 DIAGNOSIS — K2100 Gastro-esophageal reflux disease with esophagitis, without bleeding: Secondary | ICD-10-CM

## 2022-12-20 MED ORDER — LANTUS SOLOSTAR 100 UNIT/ML ~~LOC~~ SOPN: 15.0000 [IU] | PEN_INJECTOR | Freq: Every day | SUBCUTANEOUS | 0 refills | Status: DC

## 2022-12-20 NOTE — Assessment & Plan Note (Signed)
Wife gives B12 injections

## 2022-12-20 NOTE — Assessment & Plan Note (Signed)
Seems to have fair control--had to cut insulin to 15 On metformin 1000 bid If worsened control, will add farxiga/jardiance

## 2022-12-20 NOTE — Assessment & Plan Note (Signed)
BP Readings from Last 3 Encounters:  12/20/22 134/72  11/02/22 (!) 155/80  10/31/22 (!) 162/86   Controlled with losartan 25

## 2022-12-20 NOTE — Assessment & Plan Note (Signed)
Healthy Colon due again 2033 Will check PSA again Urged him to try some exercise--hard with his foot Prevnar 20 and shingrix today Flu/RSV in the fall Prefers no more COVID vaccines

## 2022-12-20 NOTE — Addendum Note (Signed)
Addended by: Eual Fines on: 12/20/2022 10:14 AM   Modules accepted: Orders

## 2022-12-20 NOTE — Assessment & Plan Note (Signed)
Voiding better with tamsulosin and finasteride

## 2022-12-20 NOTE — Assessment & Plan Note (Signed)
Will recheck

## 2022-12-20 NOTE — Assessment & Plan Note (Signed)
Quiet on the omeprazole 20 bid

## 2022-12-20 NOTE — Assessment & Plan Note (Signed)
Limits his exercise

## 2022-12-20 NOTE — Progress Notes (Signed)
Subjective:    Patient ID: [name], male    DOB: 11/07/1955, 67 y.o.   MRN: [id number]  HPI Here for physical  Has had anemia evaluation Now on B12--levels were low Bone marrow evaluation put off for now  Some dysphagia---but EGD negative Barium swallow also non diagnostic  Sees podiatrist every 4 weeks Also has new dermatologist in [place]---new ointments have really helped  On flomax and finasteride for BPH and retention Doing better now--feels like he empties well now (will have follow up with Dr Apolinar Junes at some point)  Sugars okay Some low sugar reactions--cut the insulin back to 15 units and this has resolved Ongoing numbness --especially right foot. Duloxetine has really helped  Current Outpatient Medications on File Prior to Visit  Medication Sig Dispense Refill   Alpha-Lipoic Acid 600 MG TABS Take by mouth. 2 in am and 1 in pm     amLODipine (NORVASC) 5 MG tablet TAKE 1 TABLET(5 MG) BY MOUTH DAILY 90 tablet 3   aspirin 81 MG tablet Take 81 mg by mouth daily.     B-D UF III MINI PEN NEEDLES 31G X 5 MM MISC USE AS DIRECTED DAILY 90 each 3   calcipotriene (DOVONOX) 0.005 % ointment Apply topically 2 (two) times daily.     CALCIUM-MAGNESIUM-ZINC PO Take by mouth.     Cinnamon 500 MG capsule Take 500 mg by mouth 2 (two) times daily.     clobetasol ointment (TEMOVATE) 0.05 % Apply topically.     CONTOUR NEXT TEST test strip USE 1 TO 2 TIMES DAILY TO  CHECK BLOOD SUGAR 200 strip 3   cyanocobalamin (VITAMIN B12) 1000 MCG/ML injection 1 ml injection- Weekly x 3; and then once a month. 12 mL 1   DULoxetine (CYMBALTA) 60 MG capsule Take 1 capsule (60 mg total) by mouth daily. 90 capsule 3   finasteride (PROSCAR) 5 MG tablet Take 1 tablet (5 mg total) by mouth daily. 90 tablet 1   furosemide (LASIX) 20 MG tablet Take 1 tablet (20 mg total) by mouth daily as needed. 90 tablet 3   HYDROcodone-acetaminophen (NORCO) 5-325 MG tablet Take 1-2 tablets by mouth every 6 (six)  hours as needed. 50 tablet 0   hydrocortisone valerate ointment (WEST-CORT) 0.2 % APPLY TO AFFECTED AREA(S)  TOPICALLY TWICE DAILY 120 g 1   hydrOXYzine (VISTARIL) 25 MG capsule Take 1 capsule (25 mg total) by mouth every 8 (eight) hours as needed for itching. 60 capsule 0   Ixekizumab (TALTZ) 80 MG/ML SOAJ Inject 80 mg into the skin every 28 (twenty-eight) days. For maintenance. 1 mL 4   losartan (COZAAR) 25 MG tablet TAKE 1 TABLET(25 MG) BY MOUTH DAILY 90 tablet 3   metFORMIN (GLUCOPHAGE) 1000 MG tablet TAKE 1 TABLET BY MOUTH TWICE  DAILY WITH MEALS 180 tablet 3   omeprazole (PRILOSEC) 20 MG capsule TAKE 1 CAPSULE BY MOUTH TWICE  DAILY BEFORE MEALS 180 capsule 3   simvastatin (ZOCOR) 40 MG tablet TAKE 1 TABLET BY MOUTH AT  BEDTIME 90 tablet 3   SYRINGE-NEEDLE, DISP, 3 ML (BD SAFETYGLIDE SYRINGE/NEEDLE) 25G X 1" 3 ML MISC Use the needle for IM injection once a  month; or as directed. 30 each 1   tamsulosin (FLOMAX) 0.4 MG CAPS capsule Take 1 capsule (0.4 mg total) by mouth daily. 90 capsule 1   triamcinolone lotion (KENALOG) 0.1 % APPLY TO AFFECTED AREA(S)  TOPICALLY 3 TIMES DAILY 180 mL 1   No current  facility-administered medications on file prior to visit.    Allergies  Allergen Reactions   Glipizide Other (See Comments)    REACTION: wt. gain, hands tingling   Lisinopril Other (See Comments)    HYPERKALEMIA   Ramipril Cough   Ibuprofen     UNSPECIFIED REACTION    Bactrim [Sulfamethoxazole-Trimethoprim] Nausea And Vomiting and Rash    Past Medical History:  Diagnosis Date   Adenomatous polyp    Allergy    Anemia    Arthritis    feet    Bunion 04/02/2013   STATUS POST OP BUN REPAIR   Diabetes mellitus    GERD (gastroesophageal reflux disease)    Gout    Hammertoe    Hyperlipidemia    Hypertension    Neuromuscular disorder (HCC)    neuropathy   Plantar flexed metatarsal    Psoriasis     Past Surgical History:  Procedure Laterality Date   CARPAL TUNNEL RELEASE Left  08/24/2022   Procedure: CARPAL TUNNEL RELEASE;  Surgeon: Deeann Saint, MD;  Location: [facility] ORS;  Service: Orthopedics;  Laterality: Left;   COLONOSCOPY     FOOT AMPUTATION Right 01/11/2017   Dr Deborah Chalk ([facility])--transmetatarsal   FOOT SURGERY Right 02/28/2013   Arta Bruce HAM TOE2,3 PINS ,2ND MET OSTEOTOMY, 5TH MET W/SCREW   02-2013, 04-2013   HIP FRACTURE SURGERY  1993   surgery x2   POLYPECTOMY     PROSTATE BIOPSY  10/10/2019   TOTAL HIP ARTHROPLASTY     Left hip replacement/femur fx 07/04, Screw removal left hip- [facility] 11/01    Family History  Problem Relation Age of Onset   Diabetes Mother    Diabetes Brother    Alzheimer's disease Maternal Aunt    Alzheimer's disease Cousin    Cancer Neg Hx    Colon cancer Neg Hx    Colon polyps Neg Hx    Rectal cancer Neg Hx    Stomach cancer Neg Hx    Esophageal cancer Neg Hx     Social History   Socioeconomic History   Marital status: Married    Spouse name: Not on file   Number of children: 3   Years of education: Not on file   Highest education level: Not on file  Occupational History   Occupation: PC ADMINISTRATOR    Employer: [employer]  Tobacco Use   Smoking status: Never    Passive exposure: Past   Smokeless tobacco: Never  Vaping Use   Vaping Use: Never used  Substance and Sexual Activity   Alcohol use: Yes    Alcohol/week: 1.0 standard drink of alcohol    Types: 1 Standard drinks or equivalent per week    Comment: occasional (DWI 1991)   Drug use: No   Sexual activity: Yes    Birth control/protection: None  Other Topics Concern   Not on file  Social History Narrative    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 With wife; and 2 dog.     Social Determinants of Health   Financial Resource Strain: Not on file  Food Insecurity: Not on file  Transportation Needs: Not on file  Physical Activity: Not on file  Stress: Not on file  Social Connections: Not on file   Intimate Partner Violence: Not on file   Review of Systems  Constitutional:  Negative for fatigue and unexpected weight change.       Wears seat belt  HENT:  Positive for tinnitus.  Negative for dental problem.        Has low frequency hearing loss Keeps up with dentist  Eyes:  Negative for visual disturbance.  Respiratory:  Negative for cough, chest tightness and shortness of breath.   Cardiovascular:  Negative for chest pain, palpitations and leg swelling.  Gastrointestinal:  Negative for blood in stool and constipation.       Dysphagia is improved No heartburn on omeprazole bid  Endocrine: Negative for polydipsia and polyuria.  Genitourinary:  Positive for difficulty urinating.       No sexual problems  Musculoskeletal:  Negative for back pain and joint swelling.       Had carpal tunnel surgery on left Has [facility] arthritis  Skin:  Positive for rash.  Allergic/Immunologic: Positive for environmental allergies. Negative for immunocompromised state.       Just saw Dr Lasandra Beech that is causing his congestion Rx for montelukast and starting certirizine  Neurological:  Negative for dizziness, syncope and light-headedness.       Sinus headaches  Hematological:  Negative for adenopathy. Bruises/bleeds easily.  Psychiatric/Behavioral:  Negative for dysphoric mood and sleep disturbance. The patient is not nervous/anxious.        Objective:   Physical Exam Constitutional:      Appearance: Normal appearance.  HENT:     Mouth/Throat:     Pharynx: No oropharyngeal exudate or posterior oropharyngeal erythema.  Eyes:     Conjunctiva/sclera: Conjunctivae normal.     Pupils: Pupils are equal, round, and reactive to light.  Cardiovascular:     Rate and Rhythm: Normal rate and regular rhythm.     Pulses: Normal pulses.     Heart sounds: No murmur heard.    No gallop.  Pulmonary:     Effort: Pulmonary effort is normal.     Breath sounds: Normal breath sounds. No wheezing or rales.   Abdominal:     Palpations: Abdomen is soft.     Tenderness: There is no abdominal tenderness.  Musculoskeletal:     Cervical back: Neck supple.     Right lower leg: No edema.     Left lower leg: No edema.     Comments: Transmetatarsal amputation on right--clean  Lymphadenopathy:     Cervical: No cervical adenopathy.  Skin:    Findings: No lesion or rash.     Comments: No foot lesions--just slight callous on plantar feet  Neurological:     General: No focal deficit present.     Mental Status: He is alert and oriented to person, place, and time.     Comments: Mildly decreased sensation in feet  Psychiatric:        Mood and Affect: Mood normal.        Behavior: Behavior normal.            Assessment & Plan:

## 2022-12-21 LAB — CBC
Hematocrit: 33.9 % — ABNORMAL LOW (ref 37.5–51.0)
Platelets: 239 10*3/uL (ref 150–450)

## 2022-12-21 LAB — COMPREHENSIVE METABOLIC PANEL
BUN/Creatinine Ratio: 11 (ref 10–24)
BUN: 10 mg/dL (ref 8–27)
Bilirubin Total: 0.5 mg/dL (ref 0.0–1.2)
Calcium: 9.7 mg/dL (ref 8.6–10.2)
Chloride: 90 mmol/L — ABNORMAL LOW (ref 96–106)

## 2022-12-21 LAB — LIPID PANEL
Cholesterol, Total: 187 mg/dL (ref 100–199)
VLDL Cholesterol Cal: 11 mg/dL (ref 5–40)

## 2022-12-21 LAB — MICROALBUMIN / CREATININE URINE RATIO

## 2022-12-21 LAB — VITAMIN B12: Vitamin B-12: 1498 pg/mL — ABNORMAL HIGH (ref 232–1245)

## 2022-12-21 LAB — HEMOGLOBIN A1C: Est. average glucose Bld gHb Est-mCnc: 123 mg/dL

## 2022-12-22 LAB — COMPREHENSIVE METABOLIC PANEL
ALT: 20 IU/L (ref 0–44)
AST: 34 IU/L (ref 0–40)
Albumin/Globulin Ratio: 2.2 (ref 1.2–2.2)
Albumin: 4.7 g/dL (ref 3.9–4.9)
Alkaline Phosphatase: 62 IU/L (ref 44–121)
CO2: 25 mmol/L (ref 20–29)
Creatinine, Ser: 0.87 mg/dL (ref 0.76–1.27)
Globulin, Total: 2.1 g/dL (ref 1.5–4.5)
Glucose: 69 mg/dL — ABNORMAL LOW (ref 70–99)
Potassium: 4.6 mmol/L (ref 3.5–5.2)
Sodium: 131 mmol/L — ABNORMAL LOW (ref 134–144)
Total Protein: 6.8 g/dL (ref 6.0–8.5)
eGFR: 95 mL/min/{1.73_m2} (ref >59)

## 2022-12-22 LAB — LIPID PANEL
Chol/HDL Ratio: 1.5 ratio (ref 0.0–5.0)
HDL: 124 mg/dL (ref >39)
LDL Chol Calc (NIH): 52 mg/dL (ref 0–99)
Triglycerides: 59 mg/dL (ref 0–149)

## 2022-12-22 LAB — MICROALBUMIN / CREATININE URINE RATIO
Microalb/Creat Ratio: 27 mg/g creat (ref 0–29)
Microalbumin, Urine: 16.1 ug/mL

## 2022-12-22 LAB — CBC
Hemoglobin: 11.7 g/dL — ABNORMAL LOW (ref 13.0–17.7)
MCH: 34.1 pg — ABNORMAL HIGH (ref 26.6–33.0)
MCHC: 34.5 g/dL (ref 31.5–35.7)
MCV: 99 fL — ABNORMAL HIGH (ref 79–97)
RBC: 3.43 x10E6/uL — ABNORMAL LOW (ref 4.14–5.80)
RDW: 13 % (ref 11.6–15.4)
WBC: 4.8 10*3/uL (ref 3.4–10.8)

## 2022-12-22 LAB — HEMOGLOBIN A1C: Hgb A1c MFr Bld: 5.9 % — ABNORMAL HIGH (ref 4.8–5.6)

## 2022-12-22 LAB — PSA: Prostate Specific Ag, Serum: 1.5 ng/mL (ref 0.0–4.0)

## 2022-12-27 ENCOUNTER — Ambulatory Visit: Payer: 59 | Admitting: Urology

## 2022-12-27 ENCOUNTER — Other Ambulatory Visit: Payer: Self-pay | Admitting: Internal Medicine

## 2022-12-27 VITALS — BP 162/81 | HR 87 | Ht 71.5 in | Wt 221.4 lb

## 2022-12-27 DIAGNOSIS — N401 Enlarged prostate with lower urinary tract symptoms: Secondary | ICD-10-CM | POA: Diagnosis not present

## 2022-12-27 DIAGNOSIS — R3914 Feeling of incomplete bladder emptying: Secondary | ICD-10-CM

## 2022-12-27 LAB — BLADDER SCAN AMB NON-IMAGING: Scan Result: 19

## 2022-12-27 NOTE — Progress Notes (Unsigned)
[name] presents for an office visit. BP today is __162/81_________. He is complaint with BP medication. Greater than 140/90. Provider  notified. Pt advised to___f/u with PCP___________. Pt voiced understanding.

## 2022-12-27 NOTE — Progress Notes (Signed)
Marcelle Overlie Plume,acting as a scribe for Vanna Scotland, MD.,have documented all relevant documentation on the behalf of Vanna Scotland, MD,as directed by  Vanna Scotland, MD while in the presence of Vanna Scotland, MD.  12/27/2022 4:08 PM   [name] 01/18/1956 [id number]  Referring provider: Karie Schwalbe, MD [address] [place],  [place] [address]  Chief Complaint  Patient presents with   Benign Prostatic Hypertrophy    HPI: 67 year-old male who returns today for follow up. He was last seen in 06/2022 with worsening urinary symptoms. He has a personal history of elevated fluctuating PSA. His PSA peaked at 6.7. He had a prostate biopsy in 2021 with a 77 gram prostate with a nodule on the right. His PSA stabilized. He has been treated multiple times for presumed prostatitis. Although, there has been no evidence of bacterial prostatitis. He was started on Flomax. I advised him to also use NSAIDs as needed. He was also started on finasteride as well for BPH in light of his prostatomegaly. He was not interested in an outlet procedure.   His most recent PSA on 12/20/2022 is 1.5 after starting finasteride.  Today, he reports significant improvement in urinary symptoms, feeling that he empties his bladder completely each time. He is very pleased with the current treatment regimen.  Results for orders placed or performed in visit on 12/27/22  Bladder Scan (Post Void Residual) in office  Result Value Ref Range   Scan Result 19 ml     IPSS     Row Name 12/27/22 1300         International Prostate Symptom Score   How often have you had the sensation of not emptying your bladder? Not at All     How often have you had to urinate less than every two hours? Less than half the time     How often have you found you stopped and started again several times when you urinated? Not at All     How often have you found it difficult to postpone urination? Not at All     How often have  you had a weak urinary stream? Not at All     How often have you had to strain to start urination? Not at All     How many times did you typically get up at night to urinate? 1 Time     Total IPSS Score 3       Quality of Life due to urinary symptoms   If you were to spend the rest of your life with your urinary condition just the way it is now how would you feel about that? Delighted              Score:  1-7 Mild 8-19 Moderate 20-35 Severe   PMH: Past Medical History:  Diagnosis Date   Adenomatous polyp    Allergy    Anemia    Arthritis    feet    Bunion 04/02/2013   STATUS POST OP BUN REPAIR   Diabetes mellitus    GERD (gastroesophageal reflux disease)    Gout    Hammertoe    Hyperlipidemia    Hypertension    Neuromuscular disorder (HCC)    neuropathy   Plantar flexed metatarsal    Psoriasis     Surgical History: Past Surgical History:  Procedure Laterality Date   CARPAL TUNNEL RELEASE Left 08/24/2022   Procedure: CARPAL TUNNEL RELEASE;  Surgeon: Hyacinth Meeker,  Dimas Aguas, MD;  Location: [facility] ORS;  Service: Orthopedics;  Laterality: Left;   COLONOSCOPY     FOOT AMPUTATION Right 01/11/2017   Dr Deborah Chalk ([facility])--transmetatarsal   FOOT SURGERY Right 02/28/2013   Arta Bruce HAM TOE2,3 PINS ,2ND MET OSTEOTOMY, 5TH MET W/SCREW   02-2013, 04-2013   HIP FRACTURE SURGERY  1993   surgery x2   POLYPECTOMY     PROSTATE BIOPSY  10/10/2019   TOTAL HIP ARTHROPLASTY     Left hip replacement/femur fx 07/04, Screw removal left hip- Hines 11/01    Home Medications:  Allergies as of 12/27/2022       Reactions   Glipizide Other (See Comments)   REACTION: wt. gain, hands tingling   Lisinopril Other (See Comments)   HYPERKALEMIA   Ramipril Cough   Ibuprofen    UNSPECIFIED REACTION    Bactrim [sulfamethoxazole-trimethoprim] Nausea And Vomiting, Rash        Medication List        Accurate as of December 27, 2022  4:08 PM. If you have any questions, ask your nurse or doctor.           STOP taking these medications    HYDROcodone-acetaminophen 5-325 MG tablet Commonly known as: Norco Stopped by: Vanna Scotland, MD       TAKE these medications    Alpha-Lipoic Acid 600 MG Tabs Take by mouth. 2 in am and 1 in pm   amLODipine 5 MG tablet Commonly known as: NORVASC TAKE 1 TABLET(5 MG) BY MOUTH DAILY   aspirin 81 MG tablet Take 81 mg by mouth daily.   B-D UF III MINI PEN NEEDLES 31G X 5 MM Misc Generic drug: Insulin Pen Needle USE AS DIRECTED DAILY   BD SafetyGlide Syringe/Needle 25G X 1" 3 ML Misc Generic drug: SYRINGE-NEEDLE (DISP) 3 ML Use the needle for IM injection once a  month; or as directed.   calcipotriene 0.005 % ointment Commonly known as: DOVONOX Apply topically 2 (two) times daily.   CALCIUM-MAGNESIUM-ZINC PO Take by mouth.   cetirizine 10 MG tablet Commonly known as: ZYRTEC Take 10 mg by mouth daily.   Cinnamon 500 MG capsule Take 500 mg by mouth 2 (two) times daily.   clobetasol ointment 0.05 % Commonly known as: TEMOVATE Apply topically.   Contour Next Test test strip Generic drug: glucose blood USE 1 TO 2 TIMES DAILY TO  CHECK BLOOD SUGAR   cyanocobalamin 1000 MCG/ML injection Commonly known as: VITAMIN B12 1 ml injection- Weekly x 3; and then once a month.   DULoxetine 60 MG capsule Commonly known as: CYMBALTA Take 1 capsule (60 mg total) by mouth daily.   finasteride 5 MG tablet Commonly known as: PROSCAR Take 1 tablet (5 mg total) by mouth daily.   furosemide 20 MG tablet Commonly known as: LASIX Take 1 tablet (20 mg total) by mouth daily as needed.   hydrocortisone valerate ointment 0.2 % Commonly known as: WEST-CORT APPLY TO AFFECTED AREA(S)  TOPICALLY TWICE DAILY   hydrOXYzine 25 MG capsule Commonly known as: VISTARIL Take 1 capsule (25 mg total) by mouth every 8 (eight) hours as needed for itching.   Lantus SoloStar 100 UNIT/ML Solostar Pen Generic drug: insulin glargine Inject 15 Units into the  skin daily.   losartan 25 MG tablet Commonly known as: COZAAR TAKE 1 TABLET(25 MG) BY MOUTH DAILY   metFORMIN 1000 MG tablet Commonly known as: GLUCOPHAGE TAKE 1 TABLET BY MOUTH TWICE  DAILY WITH MEALS   montelukast 10  MG tablet Commonly known as: SINGULAIR Take 10 mg by mouth at bedtime.   omeprazole 20 MG capsule Commonly known as: PRILOSEC TAKE 1 CAPSULE BY MOUTH TWICE  DAILY BEFORE MEALS   simvastatin 40 MG tablet Commonly known as: ZOCOR TAKE 1 TABLET BY MOUTH AT  BEDTIME   Taltz 80 MG/ML Soaj Generic drug: Ixekizumab Inject 80 mg into the skin every 28 (twenty-eight) days. For maintenance.   tamsulosin 0.4 MG Caps capsule Commonly known as: FLOMAX Take 1 capsule (0.4 mg total) by mouth daily.   triamcinolone lotion 0.1 % Commonly known as: KENALOG APPLY TO AFFECTED AREA(S)  TOPICALLY 3 TIMES DAILY        Allergies:  Allergies  Allergen Reactions   Glipizide Other (See Comments)    REACTION: wt. gain, hands tingling   Lisinopril Other (See Comments)    HYPERKALEMIA   Ramipril Cough   Ibuprofen     UNSPECIFIED REACTION    Bactrim [Sulfamethoxazole-Trimethoprim] Nausea And Vomiting and Rash    Family History: Family History  Problem Relation Age of Onset   Diabetes Mother    Diabetes Brother    Alzheimer's disease Maternal Aunt    Alzheimer's disease Cousin    Cancer Neg Hx    Colon cancer Neg Hx    Colon polyps Neg Hx    Rectal cancer Neg Hx    Stomach cancer Neg Hx    Esophageal cancer Neg Hx     Social History:  reports that he has never smoked. He has been exposed to tobacco smoke. He has never used smokeless tobacco. He reports current alcohol use of about 1.0 standard drink of alcohol per week. He reports that he does not use drugs.   Physical Exam: BP (!) 162/81   Pulse 87   Ht 5' 11.5" (1.816 m)   Wt 221 lb 6 oz (100.4 kg)   BMI 30.45 kg/m   Constitutional:  Alert and oriented, No acute distress. HEENT: [place] AT, moist mucus  membranes.  Trachea midline, no masses. Neurologic: Grossly intact, no focal deficits, moving all 4 extremities. Psychiatric: Normal mood and affect.  Assessment & Plan:    1. BPH - He is doing extremely well on finasteride and Flomax. - PSA is trending back downwards, >50% and he is pleased - He is emptying well - Continue current medications (finasteride and Flomax). - He is not interested in surgical intervention at this time. - At the next visit, perform PSA, DRE, and PVR. - If PSA is not done by the primary care provider before the next visit, it will be done at the follow-up appointment.  Return in about 1 year (around 12/27/2023) for PSA, DRE, IPSS, and PVR.  [facility] [address] [place], [place] [address] [phone]

## 2023-01-01 DIAGNOSIS — G5601 Carpal tunnel syndrome, right upper limb: Secondary | ICD-10-CM | POA: Insufficient documentation

## 2023-01-01 DIAGNOSIS — R200 Anesthesia of skin: Secondary | ICD-10-CM | POA: Insufficient documentation

## 2023-01-04 ENCOUNTER — Other Ambulatory Visit (HOSPITAL_COMMUNITY): Payer: Self-pay

## 2023-01-05 DIAGNOSIS — G5602 Carpal tunnel syndrome, left upper limb: Secondary | ICD-10-CM | POA: Insufficient documentation

## 2023-01-24 ENCOUNTER — Encounter: Payer: Self-pay | Admitting: Internal Medicine

## 2023-01-30 ENCOUNTER — Other Ambulatory Visit: Payer: Self-pay | Admitting: Urology

## 2023-01-30 ENCOUNTER — Other Ambulatory Visit: Payer: Self-pay | Admitting: Internal Medicine

## 2023-01-30 ENCOUNTER — Inpatient Hospital Stay: Payer: 59 | Attending: Internal Medicine | Admitting: Nurse Practitioner

## 2023-01-30 ENCOUNTER — Encounter: Payer: Self-pay | Admitting: Nurse Practitioner

## 2023-01-30 VITALS — BP 139/79 | HR 104 | Temp 98.7°F | Wt 222.0 lb

## 2023-01-30 DIAGNOSIS — D649 Anemia, unspecified: Secondary | ICD-10-CM | POA: Diagnosis not present

## 2023-01-30 DIAGNOSIS — Z79899 Other long term (current) drug therapy: Secondary | ICD-10-CM | POA: Insufficient documentation

## 2023-01-30 DIAGNOSIS — D519 Vitamin B12 deficiency anemia, unspecified: Secondary | ICD-10-CM | POA: Diagnosis not present

## 2023-01-30 DIAGNOSIS — L409 Psoriasis, unspecified: Secondary | ICD-10-CM | POA: Diagnosis not present

## 2023-01-30 DIAGNOSIS — E538 Deficiency of other specified B group vitamins: Secondary | ICD-10-CM | POA: Diagnosis present

## 2023-01-30 DIAGNOSIS — Z7982 Long term (current) use of aspirin: Secondary | ICD-10-CM | POA: Diagnosis not present

## 2023-01-30 DIAGNOSIS — N411 Chronic prostatitis: Secondary | ICD-10-CM

## 2023-01-30 NOTE — Progress Notes (Signed)
[facility] CONSULT NOTE  Patient Care Team: Karie Schwalbe, MD as PCP - General  CHIEF COMPLAINTS/PURPOSE OF CONSULTATION: ANEMIA  HEMATOLOGY HISTORY  # SEP 2023- ANEMIA MCV-platelets WBC-WNL; Iron sat; ferritin;  GFR- CT/US; EGD- in JAn 2024 /colonoscopy-dec 21st, 2023; prior 3-4 year [Dr.[facility]]- DEC 2023-  Iron studies; ferritin no evidence of iron deficiency; myeloma panel kappa lambda light chain; hepatitis screening; LDH haptoglobin; CRP-within normal limit.  Peripheral smear no evidence of schistocytes. APRIl 2024- B12 141. Started B12 injections    Latest Reference Range & Units Most Recent 12/14/21 10:08 03/29/22 13:12 06/19/22 15:18 06/28/22 11:16  Hemoglobin 13.0 - 17.7 g/dL 16.1 (L) 03/30/03 54:09 13.4 12.2 (L) 11.9 (L) 12.0 (L)  (L): Data is abnormally low  Latest Reference Range & Units 06/28/22 11:16  Iron 38 - 169 ug/dL 98  UIBC 811 - 914 ug/dL 782  TIBC 956 - 213 ug/dL 086  Ferritin 30 - 578 ng/mL 143  Iron Saturation 15 - 55 % 31   # Psoriasis [Dx- 18 years]- 2023- started TNF   HISTORY OF PRESENTING ILLNESS: Accompanied by his wife ambulating independently.   [name] 67 y.o.  male pleasant patient with mild anemia without any obvious cause who returns to clinic for follow up. In interim, he underwent endoscopy. He reports compliance with b12 injections and is now taking once a month. Energy is stable to slightly improved.    Review of Systems  Constitutional:  Positive for malaise/fatigue. Negative for chills, diaphoresis, fever and weight loss.  HENT:  Negative for nosebleeds and sore throat.   Eyes:  Negative for double vision.  Respiratory:  Negative for cough, hemoptysis, sputum production, shortness of breath and wheezing.   Cardiovascular:  Negative for chest pain, palpitations, orthopnea and leg swelling.  Gastrointestinal:  Negative for abdominal pain, blood in stool, constipation, diarrhea, heartburn, melena, nausea and  vomiting.  Genitourinary:  Positive for frequency. Negative for dysuria and urgency.  Musculoskeletal:  Negative for back pain and joint pain.  Skin: Negative.  Negative for itching and rash.  Neurological:  Negative for dizziness, tingling, focal weakness, weakness and headaches.  Endo/Heme/Allergies:  Does not bruise/bleed easily.  Psychiatric/Behavioral:  Negative for depression. The patient is not nervous/anxious and does not have insomnia.    MEDICAL HISTORY:  Past Medical History:  Diagnosis Date   Adenomatous polyp    Allergy    Anemia    Arthritis    feet    Bunion 04/02/2013   STATUS POST OP BUN REPAIR   Diabetes mellitus    GERD (gastroesophageal reflux disease)    Gout    Hammertoe    Hyperlipidemia    Hypertension    Neuromuscular disorder (HCC)    neuropathy   Plantar flexed metatarsal    Psoriasis    SURGICAL HISTORY: Past Surgical History:  Procedure Laterality Date   CARPAL TUNNEL RELEASE Left 08/24/2022   Procedure: CARPAL TUNNEL RELEASE;  Surgeon: Deeann Saint, MD;  Location: [facility] ORS;  Service: Orthopedics;  Laterality: Left;   COLONOSCOPY     FOOT AMPUTATION Right 01/11/2017   Dr Deborah Chalk ([facility])--transmetatarsal   FOOT SURGERY Right 02/28/2013   Arta Bruce HAM TOE2,3 PINS ,2ND MET OSTEOTOMY, 5TH MET W/SCREW   02-2013, 04-2013   HIP FRACTURE SURGERY  1993   surgery x2   POLYPECTOMY     PROSTATE BIOPSY  10/10/2019   TOTAL HIP ARTHROPLASTY     Left hip replacement/femur fx 07/04, Screw removal  left hip- [facility]ines 11/01   SOCIAL HISTORY: Social History   Socioeconomic History   Marital status: Married    Spouse name: Not on file   Number of children: 3   Years of education: Not on file   Highest education level: Not on file  Occupational History   Occupation: PC ADMINISTRATOR    Employer: [employer]  Tobacco Use   Smoking status: Never    Passive exposure: Past   Smokeless tobacco: Never  Vaping Use   Vaping Use: Never used  Substance and Sexual  Activity   Alcohol use: Yes    Alcohol/week: 1.0 standard drink of alcohol    Types: 1 Standard drinks or equivalent per week    Comment: occasional (DWI 1991)   Drug use: No   Sexual activity: Yes    Birth control/protection: None  Other Topics Concern   Not on file  Social History Narrative    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 With wife; and 2 dog.     Social Determinants of Health   Financial Resource Strain: Not on file  Food Insecurity: Not on file  Transportation Needs: Not on file  Physical Activity: Not on file  Stress: Not on file  Social Connections: Not on file  Intimate Partner Violence: Not on file   FAMILY HISTORY: Family History  Problem Relation Age of Onset   Diabetes Mother    Diabetes Brother    Alzheimer's disease Maternal Aunt    Alzheimer's disease Cousin    Cancer Neg Hx    Colon cancer Neg Hx    Colon polyps Neg Hx    Rectal cancer Neg Hx    Stomach cancer Neg Hx    Esophageal cancer Neg Hx     ALLERGIES:  is allergic to glipizide, lisinopril, ramipril, ibuprofen, and bactrim [sulfamethoxazole-trimethoprim].  MEDICATIONS:  Current Outpatient Medications  Medication Sig Dispense Refill   Alpha-Lipoic Acid 600 MG TABS Take by mouth. 2 in am and 1 in pm     amLODipine (NORVASC) 5 MG tablet TAKE 1 TABLET(5 MG) BY MOUTH DAILY 90 tablet 3   aspirin 81 MG tablet Take 81 mg by mouth daily.     B-D UF III MINI PEN NEEDLES 31G X 5 MM MISC USE AS DIRECTED DAILY 90 each 3   calcipotriene (DOVONOX) 0.005 % ointment Apply topically 2 (two) times daily.     CALCIUM-MAGNESIUM-ZINC PO Take by mouth.     cetirizine (ZYRTEC) 10 MG tablet Take 10 mg by mouth daily.     Cinnamon 500 MG capsule Take 500 mg by mouth 2 (two) times daily.     clobetasol ointment (TEMOVATE) 0.05 % Apply topically.     CONTOUR NEXT TEST test strip USE 1 TO 2 TIMES DAILY TO  CHECK BLOOD SUGAR 200 strip 3   cyanocobalamin  (VITAMIN B12) 1000 MCG/ML injection 1 ml injection- Weekly x 3; and then once a month. 12 mL 1   DULoxetine (CYMBALTA) 60 MG capsule Take 1 capsule (60 mg total) by mouth daily. 90 capsule 3   finasteride (PROSCAR) 5 MG tablet TAKE 1 TABLET BY MOUTH DAILY 90 tablet 3   furosemide (LASIX) 20 MG tablet Take 1 tablet (20 mg total) by mouth daily as needed. 90 tablet 3   hydrocortisone valerate ointment (WEST-CORT) 0.2 % APPLY TOPICALLY TO AFFECTED  AREA(S) TWICE DAILY 120 g 1   hydrOXYzine (VISTARIL) 25 MG capsule Take 1 capsule (  25 mg total) by mouth every 8 (eight) hours as needed for itching. 60 capsule 0   insulin glargine (LANTUS SOLOSTAR) 100 UNIT/ML Solostar Pen Inject 15 Units into the skin daily. 1 mL 0   Ixekizumab (TALTZ) 80 MG/ML SOAJ Inject 80 mg into the skin every 28 (twenty-eight) days. For maintenance. 1 mL 4   losartan (COZAAR) 25 MG tablet TAKE 1 TABLET(25 MG) BY MOUTH DAILY 90 tablet 3   metFORMIN (GLUCOPHAGE) 1000 MG tablet TAKE 1 TABLET BY MOUTH TWICE  DAILY WITH MEALS 180 tablet 3   montelukast (SINGULAIR) 10 MG tablet Take 10 mg by mouth at bedtime.     omeprazole (PRILOSEC) 20 MG capsule TAKE 1 CAPSULE BY MOUTH TWICE  DAILY BEFORE MEALS 180 capsule 3   predniSONE (DELTASONE) 5 MG tablet See admin instructions.     simvastatin (ZOCOR) 40 MG tablet TAKE 1 TABLET BY MOUTH AT  BEDTIME 90 tablet 3   SYRINGE-NEEDLE, DISP, 3 ML (BD SAFETYGLIDE SYRINGE/NEEDLE) 25G X 1" 3 ML MISC Use the needle for IM injection once a  month; or as directed. 30 each 1   tamsulosin (FLOMAX) 0.4 MG CAPS capsule TAKE 1 CAPSULE BY MOUTH DAILY 90 capsule 3   triamcinolone lotion (KENALOG) 0.1 % APPLY TO AFFECTED AREA(S)  TOPICALLY 3 TIMES DAILY 180 mL 1   No current facility-administered medications for this visit.   PHYSICAL EXAMINATION: Vitals:   01/30/23 1016  BP: 139/79  Pulse: (!) 104  Temp: 98.7 F (37.1 C)  SpO2: 99%   Filed Weights   01/30/23 1016  Weight: 222 lb (100.7 kg)   Physical  Exam Constitutional:      Appearance: He is not ill-appearing.  Eyes:     General: No scleral icterus.    Conjunctiva/sclera: Conjunctivae normal.  Cardiovascular:     Rate and Rhythm: Normal rate and regular rhythm.  Abdominal:     General: There is no distension.     Palpations: Abdomen is soft.     Tenderness: There is no abdominal tenderness. There is no guarding.  Musculoskeletal:        General: No deformity.     Right lower leg: No edema.     Left lower leg: No edema.  Lymphadenopathy:     Cervical: No cervical adenopathy.  Skin:    General: Skin is warm and dry.  Neurological:     Mental Status: He is alert and oriented to person, place, and time. Mental status is at baseline.  Psychiatric:        Mood and Affect: Mood normal.        Behavior: Behavior normal.     LABORATORY DATA:  I have reviewed the data as listed Lab Results  Component Value Date   WBC 4.8 12/20/2022   HGB 11.7 (L) 12/20/2022   HCT 33.9 (L) 12/20/2022   MCV 99 (H) 12/20/2022   PLT 239 12/20/2022   Recent Labs    03/29/22 1312 06/19/22 1518 06/23/22 0954 06/28/22 1116 12/20/22 1004  NA 136 124* 127* 134 131*  K 5.0 5.5* 4.6 4.3 4.6  CL 95* 87* 86* 93* 90*  CO2 19* 20 22 25 25   GLUCOSE 159* 133* 163* 298* 69*  BUN 8 8 10 12 10   CREATININE 1.07 1.11 1.26 1.04 0.87  CALCIUM 9.5 9.0 9.5 9.6 9.7  PROT 6.4 6.4  --   --  6.8  ALBUMIN 4.4 4.5 4.5 4.3 4.7  AST 33 35  --   --  34  ALT 24 20  --   --  20  ALKPHOS 92 76  --   --  62  BILITOT 0.4 0.5  --   --  0.5    No results found.  ASSESSMENT & PLAN:   # Anemia- mild Hb 12; normocytic [since MAY 2023]- DEC 2023 - Iron studies; ferritin no evidence of iron deficiency; myeloma panel kappa lambda light chain; hepatitis screening; LDH haptoglobin; CRP-within normal limit. Peripheral smear no evidence of schistocytes.  Possible B12 deficiency-see below. Hemoglobin is stable at 11.9. No other cytopenias. Question if related to anemia of  chronic disease. He's otherwise asymptomatic. Hold bone marrow for now.   # B12 deficiency- 141 [March 2024-; [facility]]. He is s/p weekly b12 followed by monthly. B12 improved to 664. Could also consider checking MMA and autoimmune antibodies. Continue monthly b12 injections.   # Psoriasis- on TNF blocker, TALTZ [Dr.Kowalski]- stable/improved  DISPOSITION: 3 mo- see Dr Donneta Romberg for anemia f/u- la Labs at [facility] (cbc, cmp, b12, MMA, antiparietal antibodies, intrinsic factor antibodies)  No problem-specific Assessment & Plan notes found for this encounter.  All questions were answered. The patient knows to call the clinic with any problems, questions or concerns.  Alinda Dooms, NP 01/30/2023

## 2023-01-31 ENCOUNTER — Encounter: Payer: Self-pay | Admitting: Otolaryngology

## 2023-01-31 NOTE — Anesthesia Preprocedure Evaluation (Addendum)
Anesthesia Evaluation  Patient identified by MRN, date of birth, ID band Patient awake    Reviewed: Allergy & Precautions, H&P , NPO status , Patient's Chart, lab work & pertinent test results  Airway Mallampati: III  TM Distance: >3 FB Neck ROM: Full    Dental  (+) Chipped One missing, one capped but not in upper front teeth:   Pulmonary neg pulmonary ROS   Pulmonary exam normal breath sounds clear to auscultation       Cardiovascular hypertension, negative cardio ROS Normal cardiovascular exam Rhythm:Regular Rate:Normal     Neuro/Psych  Neuromuscular disease negative neurological ROS  negative psych ROS   GI/Hepatic negative GI ROS, Neg liver ROS,GERD  ,,  Endo/Other  negative endocrine ROSdiabetes    Renal/GU negative Renal ROS  negative genitourinary   Musculoskeletal negative musculoskeletal ROS (+) Arthritis ,    Abdominal   Peds negative pediatric ROS (+)  Hematology negative hematology ROS (+) Blood dyscrasia, anemia   Anesthesia Other Findings GERD (gastroesophageal reflux disease) Gout Hyperlipidemia  Hypertension Psoriasis Hammertoe Bunion Plantar  flexed metatarsal Schatzki's ring Adenomatous polyp  Arthritis Allergy  Neuromuscular disorder (HCC) Diabetes mellitus  Anemia    Reproductive/Obstetrics negative OB ROS                             Anesthesia Physical Anesthesia Plan  ASA: 2  Anesthesia Plan: General ETT   Post-op Pain Management:    Induction: Intravenous  PONV Risk Score and Plan:   Airway Management Planned: Oral ETT  Additional Equipment:   Intra-op Plan:   Post-operative Plan: Extubation in OR  Informed Consent: I have reviewed the patients History and Physical, chart, labs and discussed the procedure including the risks, benefits and alternatives for the proposed anesthesia with the patient or authorized representative who has  indicated his/her understanding and acceptance.     Dental Advisory Given  Plan Discussed with: Anesthesiologist, CRNA and Surgeon  Anesthesia Plan Comments: (Patient consented for risks of anesthesia including but not limited to:  - adverse reactions to medications - damage to eyes, teeth, lips or other oral mucosa - nerve damage due to positioning  - sore throat or hoarseness - Damage to heart, brain, nerves, lungs, other parts of body or loss of life  Patient voiced understanding.)       Anesthesia Quick Evaluation

## 2023-02-01 NOTE — Discharge Instructions (Signed)
[facility] ENDOSCOPIC SINUS SURGERY [facility]  What is Functional Endoscopic Sinus Surgery?  The Surgery involves making the natural openings of the sinuses larger by removing the bony partitions that separate the sinuses from the nasal cavity.  The natural sinus lining is preserved as much as possible to allow the sinuses to resume normal function after the surgery.  In some patients nasal polyps (excessively swollen lining of the sinuses) may be removed to relieve obstruction of the sinus openings.  The surgery is performed through the nose using lighted scopes, which eliminates the need for incisions on the face.  A septoplasty is a different procedure which is sometimes performed with sinus surgery.  It involves straightening the boy partition that separates the two sides of your nose.  A crooked or deviated septum may need repair if is obstructing the sinuses or nasal airflow.  Turbinate reduction is also often performed during sinus surgery.  The turbinates are bony proturberances from the side walls of the nose which swell and can obstruct the nose in patients with sinus and allergy problems.  Their size can be surgically reduced to help relieve nasal obstruction.  What Can Sinus Surgery Do For Me?  Sinus surgery can reduce the frequency of sinus infections requiring antibiotic treatment.  This can provide improvement in nasal congestion, post-nasal drainage, facial pressure and nasal obstruction.  Surgery will NOT prevent you from ever having an infection again, so it usually only for patients who get infections 4 or more times yearly requiring antibiotics, or for infections that do not clear with antibiotics.  It will not cure nasal allergies, so patients with allergies may still require medication to treat their allergies after surgery. Surgery may improve headaches related to sinusitis, however, some people will continue to  require medication to control sinus headaches related to allergies.  Surgery will do nothing for other forms of headache (migraine, tension or cluster).  What Are the Risks of Endoscopic Sinus Surgery?  Current techniques allow surgery to be performed safely with little risk, however, there are rare complications that patients should be aware of.  Because the sinuses are located around the eyes, there is risk of eye injury, including blindness, though again, this would be quite rare. This is usually a result of bleeding behind the eye during surgery, which can effect vision, though there are treatments to protect the vision and prevent permanent injury. More serious complications would include bleeding inside the brain cavity or damage to the brain.This happens when the fluid around the brain leaks out into the sinus cavity.  Again, all of these complications are uncommon, and spinal fluid leaks can be safely managed surgically if they occur.  The most common complication of sinus surgery is bleeding from the nose, which may require packing or cauterization of the nose.  Patients with polyps may experience recurrence of the polyps that would require revision surgery.  Alterations of sense of smell or injury to the tear ducts are also rare complications.   What is the Surgery Like, and what is the Recovery?  The Surgery usually takes a couple of hours to perform, and is usually performed under a general anesthetic (completely asleep).  Patients are usually discharged home after a couple of hours.  Sometimes during surgery it is necessary to pack the nose to control bleeding, and the packing is left in place for 24 - 48 hours, and removed by your surgeon.  If   a septoplasty was performed during the procedure, there is often a splint placed which must be removed after 5-7 days.   Discomfort: Pain is usually mild to moderate, and can be controlled by prescription pain medication or acetaminophen (Tylenol).   Aspirin, Ibuprofen (Advil, Motrin), or Naprosyn (Aleve) should be avoided, as they can cause increased bleeding.  Most patients feel sinus pressure like they have a bad head cold for several days.  Sleeping with your head elevated can help reduce swelling and facial pressure, as can ice packs over the face.  A humidifier may be helpful to keep the mucous and blood from drying in the nose.   Diet: There are no specific diet restrictions, however, you should generally start with clear liquids and a light diet of bland foods because the anesthetic can cause some nausea.  Advance your diet depending on how your stomach feels.  Taking your pain medication with food will often help reduce stomach upset which pain medications can cause.  Nasal Saline Irrigation: It is important to remove blood clots and dried mucous from the nose as it is healing.  This is done by having you irrigate the nose at least 3 - 4 times daily with a salt water solution.  We recommend using NeilMed Sinus Rinse (available at the drug store).  Fill the squeeze bottle with the solution, bend over a sink, and insert the tip of the squeeze bottle into the nose  of an inch.  Point the tip of the squeeze bottle towards the inside corner of the eye on the same side your irrigating.  Squeeze the bottle and gently irrigate the nose.  If you bend forward as you do this, most of the fluid will flow back out of the nose, instead of down your throat.   The solution should be warm, near body temperature, when you irrigate.   Each time you irrigate, you should use a full squeeze bottle.   Note that if you are instructed to use Nasal Steroid Sprays at any time after your surgery, irrigate with saline BEFORE using the steroid spray, so you do not wash it all out of the nose. Another product, Nasal Saline Gel (such as AYR Nasal Saline Gel) can be applied in each nostril 3 - 4 times daily to moisture the nose and reduce scabbing or crusting.  Bleeding:   Bloody drainage from the nose can be expected for several days, and patients are instructed to irrigate their nose frequently with salt water to help remove mucous and blood clots.  The drainage may be dark red or brown, though some fresh blood may be seen intermittently, especially after irrigation.  Do not blow you nose, as bleeding may occur. If you must sneeze, keep your mouth open to allow air to escape through your mouth.  If heavy bleeding occurs: Irrigate the nose with saline to rinse out clots, then spray the nose 3 - 4 times with Afrin Nasal Decongestant Spray.  The spray will constrict the blood vessels to slow bleeding.  Pinch the lower half of your nose shut to apply pressure, and lay down with your head elevated.  Ice packs over the nose may help as well. If bleeding persists despite these measures, you should notify your doctor.  Do not use the Afrin routinely to control nasal congestion after surgery, as it can result in worsening congestion and may affect healing.     Activity: Return to work varies among patients. Most patients will be out   of work at least 5 - 7 days to recover.  Patient may return to work after they are off of narcotic pain medication, and feeling well enough to perform the functions of their job.  Patients must avoid heavy lifting (over 10 pounds) or strenuous physical for 2 weeks after surgery, so your employer may need to assign you to light duty, or keep you out of work longer if light duty is not possible.  NOTE: you should not drive, operate dangerous machinery, do any mentally demanding tasks or make any important legal or financial decisions while on narcotic pain medication and recovering from the general anesthetic.    Call Your Doctor Immediately if You Have Any of the Following: Bleeding that you cannot control with the above measures Loss of vision, double vision, bulging of the eye or black eyes. Fever over 101 degrees Neck stiffness with severe headache,  fever, nausea and change in mental state. You are always encouraged to call anytime with concerns, however, please call with requests for pain medication refills during office hours.  Office Endoscopy: During follow-up visits your doctor will remove any packing or splints that may have been placed and evaluate and clean your sinuses endoscopically.  Topical anesthetic will be used to make this as comfortable as possible, though you may want to take your pain medication prior to the visit.  How often this will need to be done varies from patient to patient.  After complete recovery from the surgery, you may need follow-up endoscopy from time to time, particularly if there is concern of recurrent infection or nasal polyps.  

## 2023-02-08 ENCOUNTER — Encounter
Admission: RE | Disposition: A | Discharge status: Home / Self Care | Payer: Self-pay | Source: Home / Self Care | Attending: Otolaryngology

## 2023-02-08 ENCOUNTER — Ambulatory Visit: Payer: 59 | Admitting: Anesthesiology

## 2023-02-08 ENCOUNTER — Ambulatory Visit
Admission: RE | Admit: 2023-02-08 | Discharge: 2023-02-08 | Disposition: A | Discharge status: Home / Self Care | Payer: 59 | Attending: Otolaryngology | Admitting: Otolaryngology

## 2023-02-08 ENCOUNTER — Encounter: Payer: Self-pay | Admitting: Otolaryngology

## 2023-02-08 ENCOUNTER — Other Ambulatory Visit: Payer: Self-pay

## 2023-02-08 DIAGNOSIS — E119 Type 2 diabetes mellitus without complications: Secondary | ICD-10-CM | POA: Insufficient documentation

## 2023-02-08 DIAGNOSIS — K219 Gastro-esophageal reflux disease without esophagitis: Secondary | ICD-10-CM | POA: Insufficient documentation

## 2023-02-08 DIAGNOSIS — I10 Essential (primary) hypertension: Secondary | ICD-10-CM | POA: Insufficient documentation

## 2023-02-08 DIAGNOSIS — J301 Allergic rhinitis due to pollen: Secondary | ICD-10-CM | POA: Diagnosis not present

## 2023-02-08 DIAGNOSIS — J343 Hypertrophy of nasal turbinates: Secondary | ICD-10-CM | POA: Insufficient documentation

## 2023-02-08 DIAGNOSIS — J342 Deviated nasal septum: Secondary | ICD-10-CM | POA: Insufficient documentation

## 2023-02-08 HISTORY — PX: NASAL SEPTOPLASTY W/ TURBINOPLASTY: SHX2070

## 2023-02-08 HISTORY — DX: Esophageal obstruction: K22.2

## 2023-02-08 LAB — GLUCOSE, CAPILLARY: Glucose-Capillary: 138 mg/dL — ABNORMAL HIGH (ref 70–99)

## 2023-02-08 SURGERY — SEPTOPLASTY, NOSE, WITH NASAL TURBINATE REDUCTION
Anesthesia: General | Laterality: Bilateral

## 2023-02-08 MED ORDER — MIDAZOLAM HCL 5 MG/5ML IJ SOLN
INTRAMUSCULAR | Status: DC | PRN
  Administered 2023-02-08: 2 mg via INTRAVENOUS

## 2023-02-08 MED ORDER — ACETAMINOPHEN 10 MG/ML IV SOLN: INTRAVENOUS | Status: DC | PRN

## 2023-02-08 MED ORDER — LIDOCAINE HCL (CARDIAC) PF 100 MG/5ML IV SOSY
PREFILLED_SYRINGE | INTRAVENOUS | Status: DC | PRN
  Administered 2023-02-08: 40 mg via INTRAVENOUS

## 2023-02-08 MED ORDER — GLYCOPYRROLATE 0.2 MG/ML IJ SOLN
INTRAMUSCULAR | Status: DC | PRN
  Administered 2023-02-08: .2 mg via INTRAVENOUS

## 2023-02-08 MED ORDER — FENTANYL CITRATE (PF) 100 MCG/2ML IJ SOLN
INTRAMUSCULAR | Status: DC | PRN
  Administered 2023-02-08: 50 ug via INTRAVENOUS

## 2023-02-08 MED ORDER — SUCCINYLCHOLINE CHLORIDE 200 MG/10ML IV SOSY
PREFILLED_SYRINGE | INTRAVENOUS | Status: DC | PRN
  Administered 2023-02-08: 100 mg via INTRAVENOUS

## 2023-02-08 MED ORDER — LACTATED RINGERS IV SOLN: INTRAVENOUS | Status: DC

## 2023-02-08 MED ORDER — CEPHALEXIN 500 MG PO CAPS: 500.0000 mg | ORAL_CAPSULE | Freq: Two times a day (BID) | ORAL | 0 refills | Status: DC

## 2023-02-08 MED ORDER — ACETAMINOPHEN 10 MG/ML IV SOLN
1000.0000 mg | Freq: Once | INTRAVENOUS | Status: AC
  Administered 2023-02-08: 1000 mg via INTRAVENOUS

## 2023-02-08 MED ORDER — HYDROCODONE-ACETAMINOPHEN 5-325 MG PO TABS: 1.0000 | ORAL_TABLET | Freq: Four times a day (QID) | ORAL | 0 refills | Status: AC | PRN

## 2023-02-08 MED ORDER — ONDANSETRON HCL 4 MG/2ML IJ SOLN
INTRAMUSCULAR | Status: DC | PRN
  Administered 2023-02-08: 4 mg via INTRAVENOUS

## 2023-02-08 MED ORDER — OXYCODONE HCL 5 MG PO TABS: 10.0000 mg | ORAL_TABLET | Freq: Once | ORAL | Status: DC

## 2023-02-08 MED ORDER — OXYMETAZOLINE HCL 0.05 % NA SOLN
2.0000 | Freq: Once | NASAL | Status: AC
  Administered 2023-02-08: 2 via NASAL

## 2023-02-08 MED ORDER — PROPOFOL 10 MG/ML IV BOLUS
INTRAVENOUS | Status: DC | PRN
Start: 2023-02-08 — End: 2023-02-08
  Administered 2023-02-08: 50 mg via INTRAVENOUS
  Administered 2023-02-08: 200 mg via INTRAVENOUS

## 2023-02-08 MED ORDER — PREDNISONE 10 MG PO TABS: ORAL_TABLET | ORAL | 0 refills | Status: DC

## 2023-02-08 MED ORDER — DEXAMETHASONE SODIUM PHOSPHATE 4 MG/ML IJ SOLN
INTRAMUSCULAR | Status: DC | PRN
  Administered 2023-02-08: 8 mg via INTRAVENOUS

## 2023-02-08 MED ORDER — LIDOCAINE-EPINEPHRINE 1 %-1:100000 IJ SOLN
INTRAMUSCULAR | Status: DC | PRN
  Administered 2023-02-08: 7 mL

## 2023-02-08 MED ORDER — PHENYLEPHRINE HCL 0.5 % NA SOLN
NASAL | Status: DC | PRN
  Administered 2023-02-08: 15 mL via TOPICAL

## 2023-02-08 MED ORDER — DEXTROSE 5 % IV SOLN
2000.0000 mg | Freq: Once | INTRAVENOUS | Status: AC
  Administered 2023-02-08: 2000 mg via INTRAVENOUS

## 2023-02-08 SURGICAL SUPPLY — 26 items
ANTIFOG SOL W/FOAM PAD STRL (MISCELLANEOUS) ×1
CANISTER SUCT 1200ML W/VALVE (MISCELLANEOUS) ×1 IMPLANT
COAGULATOR SUCT 8FR VV (MISCELLANEOUS) ×1 IMPLANT
ELECT REM PT RETURN 9FT ADLT (ELECTROSURGICAL) ×1
ELECTRODE REM PT RTRN 9FT ADLT (ELECTROSURGICAL) ×1 IMPLANT
GLOVE SURG GAMMEX PI TX LF 7.5 (GLOVE) ×2 IMPLANT
GOWN STRL REUS W/ TWL LRG LVL3 (GOWN DISPOSABLE) ×1 IMPLANT
GOWN STRL REUS W/TWL LRG LVL3 (GOWN DISPOSABLE) ×1
KIT TURNOVER KIT A (KITS) ×1 IMPLANT
NDL ANESTHESIA 27G X 3.5 (NEEDLE) ×1 IMPLANT
NDL HYPO 27GX1-1/4 (NEEDLE) ×1 IMPLANT
NEEDLE ANESTHESIA 27G X 3.5 (NEEDLE) ×1 IMPLANT
NEEDLE HYPO 27GX1-1/4 (NEEDLE) ×1 IMPLANT
PACK ENT CUSTOM (PACKS) ×1 IMPLANT
PATTIES SURGICAL .5 X3 (DISPOSABLE) ×1 IMPLANT
SOLUTION ANTFG W/FOAM PAD STRL (MISCELLANEOUS) ×1 IMPLANT
SPLINT NASAL SEPTAL BLV .50 ST (MISCELLANEOUS) ×1 IMPLANT
STRAP BODY AND KNEE 60X3 (MISCELLANEOUS) ×1 IMPLANT
SUT CHROMIC 3-0 (SUTURE) ×1
SUT CHROMIC 3-0 KS 27XMFL CR (SUTURE) ×1
SUT ETHILON 3-0 KS 30 BLK (SUTURE) ×1 IMPLANT
SUT PLAIN GUT 4-0 (SUTURE) ×1 IMPLANT
SUTURE CHRMC 3-0 KS 27XMFL CR (SUTURE) ×1 IMPLANT
SYR 3ML LL SCALE MARK (SYRINGE) ×1 IMPLANT
TOWEL OR 17X26 4PK STRL BLUE (TOWEL DISPOSABLE) ×1 IMPLANT
WATER STERILE IRR 250ML POUR (IV SOLUTION) ×1 IMPLANT

## 2023-02-08 NOTE — Op Note (Signed)
02/08/2023  11:15 AM  [id number]   Pre-Op Dx:  Deviated Nasal Septum, Hypertrophic Inferior Turbinates  Post-op Dx: Same  Proc: Nasal Septoplasty, Bilateral Partial Reduction Inferior Turbinates   Surg:  Beverly Sessions Chanelle Hodsdon  Anes:  GOT  EBL: 50 mL  Comp: None  Findings: The ethmoid plate was deviated way off to the right side.  The maxillary crest and inferior septal cartilage buckled into the left lower nasal cavity.  The inferior turbinates were very enlarged.  Procedure: With the patient in a comfortable supine position,  general orotracheal anesthesia was induced without difficulty.     The patient received preoperative Afrin spray for topical decongestion and vasoconstriction.  Intravenous prophylactic antibiotics were administered.  At an appropriate level, the patient was placed in a semi-sitting position.  Nasal vibrissae were trimmed.   1% Xylocaine with 1:100,000 epinephrine, 7 cc's, was infiltrated into the anterior floor of the nose, into the nasal spine region, into the membranous columella, and finally into the submucoperichondrial plane of the septum on both sides.  Several minutes were allowed for this to take effect.  Cottoniod pledgetts soaked in Afrin and 4% Xylocaine were placed into both nasal cavities and left while the patient was prepped and draped in the standard fashion.  The materials were removed from the nose and observed to be intact and correct in number.  The nose was inspected with a headlight and zero degree scope with the findings as described above.  A left hemitransfixion incision was sharply executed and carried down to the quadrangular cartilage. The mucoperichondrium was elelvated along the quadrangular plate back to the bony-cartilaginous junction. The mucoperiostium was then elevated along the ethmoid plate and the vomer. The boney-catilaginous junction was then split with a freer elevator and the mucoperiosteum was elevated on the opposite side. The  mucoperiosteum was then elevated along the maxillary crest as needed to expose the crooked bone of the crest.  Boney spurs of the vomer and maxillary crest were removed with Lenoria Chime forceps.  The cartilaginous plate was trimmed along its posterior and inferior borders of about 2 mm of cartilage to free it up inferiorly. Some of the deviated ethmoid plate was then fractured and removed with Takahashi forceps to free up the posterior border of the quadrangular plate and allow it to swing back to the midline. The mucosal flaps were placed back into their anatomic position to allow visualization of the airways. The septum now sat in the midline with an improved airway.  A 3-0 Chromic suture on a Keith needle in used to anchor the inferior septum at the nasal spine with a through and through suture. The mucosal flaps are then sutured together using a through and through whip stitch of 4-0 Plain Gut with a mini-Keith needle. This was used to close the hemitransfixion incision as well.   The inferior turbinates were then inspected. An incision was created along the inferior aspect of the left inferior turbinate with removal of some of the inferior soft tissue and bone. Electrocautery was used to control bleeding in the area. The remaining turbinate was then outfractured to open up the airway further. There was no significant bleeding noted. The right turbinate was then trimmed and outfractured in a similar fashion.  The airways were then visualized and showed open passageways on both sides that were significantly improved compared to before surgery. There was no signifcant bleeding. Nasal splints were applied to both sides of the septum using Xomed 0.64mm regular sized splints that  were trimmed, and then held in position with a 3-0 Nylon through and through suture.  The patient was turned back over to anesthesia, and awakened, extubated, and taken to the PACU in satisfactory condition.  Dispo:   PACU to  home  Plan: Ice, elevation, narcotic analgesia, steroid taper, and prophylactic antibiotics for the duration of indwelling nasal foreign bodies.  We will reevaluate the patient in the office in 6 days and remove the septal splints.  Return to work in 10 days, strenuous activities in two weeks.   Beverly Sessions Kalyan Barabas 02/08/2023 11:15 AM

## 2023-02-08 NOTE — Anesthesia Procedure Notes (Signed)
Procedure Name: Intubation Date/Time: 02/08/2023 10:09 AM  Performed by: Andee Poles, CRNAPre-anesthesia Checklist: Patient identified, Emergency Drugs available, Suction available, Patient being monitored and Timeout performed Patient Re-evaluated:Patient Re-evaluated prior to induction Oxygen Delivery Method: Circle system utilized Preoxygenation: Pre-oxygenation with 100% oxygen Induction Type: IV induction Ventilation: Mask ventilation without difficulty Laryngoscope Size: Mac and 4 Grade View: Grade I Tube type: Oral Rae Tube size: 7.5 mm Number of attempts: 1 Placement Confirmation: ETT inserted through vocal cords under direct vision, positive ETCO2 and breath sounds checked- equal and bilateral Tube secured with: Tape Dental Injury: Teeth and Oropharynx as per pre-operative assessment

## 2023-02-08 NOTE — Transfer of Care (Signed)
Immediate Anesthesia Transfer of Care Note  Patient: [name]  Procedure(s) Performed: NASAL SEPTOPLASTY WITH TURBINATE REDUCTION (Bilateral)  Patient Location: PACU  Anesthesia Type: General ETT  Level of Consciousness: awake, alert  and patient cooperative  Airway and Oxygen Therapy: Patient Spontanous Breathing and Patient connected to supplemental oxygen  Post-op Assessment: Post-op Vital signs reviewed, Patient's Cardiovascular Status Stable, Respiratory Function Stable, Patent Airway and No signs of Nausea or vomiting  Post-op Vital Signs: Reviewed and stable  Complications: No notable events documented.

## 2023-02-08 NOTE — H&P (Signed)
H&P has been reviewed and patient reevaluated, no changes necessary. To be downloaded later.  

## 2023-02-08 NOTE — Anesthesia Postprocedure Evaluation (Signed)
Anesthesia Post Note  Patient: [name]  Procedure(s) Performed: NASAL SEPTOPLASTY WITH TURBINATE REDUCTION (Bilateral)  Patient location during evaluation: PACU Anesthesia Type: General Level of consciousness: awake and alert Pain management: pain level controlled Vital Signs Assessment: post-procedure vital signs reviewed and stable Respiratory status: spontaneous breathing, nonlabored ventilation, respiratory function stable and patient connected to nasal cannula oxygen Cardiovascular status: blood pressure returned to baseline and stable Postop Assessment: no apparent nausea or vomiting Anesthetic complications: no   No notable events documented.   Last Vitals:  Vitals:   02/08/23 1130 02/08/23 1145  BP: 136/75 136/75  Pulse: (!) 101 94  Resp: 14 16  Temp:    SpO2: 96% 95%    Last Pain:  Vitals:   02/08/23 1145  TempSrc:   PainSc: 0-No pain                 Kareen Hitsman C Louanne Calvillo

## 2023-02-09 ENCOUNTER — Encounter: Payer: Self-pay | Admitting: Otolaryngology

## 2023-02-14 ENCOUNTER — Other Ambulatory Visit: Payer: Self-pay | Admitting: Internal Medicine

## 2023-02-14 ENCOUNTER — Ambulatory Visit: Payer: 59 | Admitting: Dermatology

## 2023-03-09 ENCOUNTER — Encounter: Payer: Self-pay | Admitting: Internal Medicine

## 2023-03-13 ENCOUNTER — Encounter: Payer: Self-pay | Admitting: Ophthalmology

## 2023-03-15 ENCOUNTER — Encounter: Payer: Self-pay | Admitting: Ophthalmology

## 2023-03-15 NOTE — Anesthesia Preprocedure Evaluation (Addendum)
Anesthesia Evaluation  Patient identified by MRN, date of birth, ID band Patient awake    Reviewed: Allergy & Precautions, H&P , NPO status , Patient's Chart, lab work & pertinent test results  Airway Mallampati: III  TM Distance: >3 FB Neck ROM: Full    Dental no notable dental hx. (+) Chipped  Chipped One missing, one capped but not in upper front teeth:  :   Pulmonary neg pulmonary ROS   Pulmonary exam normal breath sounds clear to auscultation       Cardiovascular hypertension, negative cardio ROS Normal cardiovascular exam Rhythm:Regular Rate:Normal     Neuro/Psych  Neuromuscular disease negative neurological ROS  negative psych ROS   GI/Hepatic negative GI ROS, Neg liver ROS,GERD  ,,  Endo/Other  negative endocrine ROSdiabetes    Renal/GU negative Renal ROS  negative genitourinary   Musculoskeletal negative musculoskeletal ROS (+) Arthritis ,    Abdominal   Peds negative pediatric ROS (+)  Hematology negative hematology ROS (+) Blood dyscrasia, anemia   Anesthesia Other Findings GERD (gastroesophageal reflux disease) Gout Hyperlipidemia  Hypertension Psoriasis  Hammertoe Bunion  Plantar flexed metatarsal Adenomatous polyp  Bilateral open foot ulcers Arthritis Allergy Neuromuscular disorder (HCC) Diabetes mellitus  Anemia Schatzki's ring   Previous nasal septoplasty 02-04-23 with Dr. Juel Burrow anesthesiologist  Reproductive/Obstetrics negative OB ROS                             Anesthesia Physical Anesthesia Plan  ASA: 3  Anesthesia Plan: MAC   Post-op Pain Management:    Induction: Intravenous  PONV Risk Score and Plan:   Airway Management Planned: Natural Airway and Nasal Cannula  Additional Equipment:   Intra-op Plan:   Post-operative Plan:   Informed Consent: I have reviewed the patients History and Physical, chart, labs and discussed the procedure  including the risks, benefits and alternatives for the proposed anesthesia with the patient or authorized representative who has indicated his/her understanding and acceptance.     Dental Advisory Given  Plan Discussed with: Anesthesiologist, CRNA and Surgeon  Anesthesia Plan Comments: (Patient consented for risks of anesthesia including but not limited to:  - adverse reactions to medications - damage to eyes, teeth, lips or other oral mucosa - nerve damage due to positioning  - sore throat or hoarseness - Damage to heart, brain, nerves, lungs, other parts of body or loss of life  Patient voiced understanding.)        Anesthesia Quick Evaluation

## 2023-03-19 NOTE — Discharge Instructions (Signed)

## 2023-03-20 ENCOUNTER — Encounter: Payer: Self-pay | Admitting: Ophthalmology

## 2023-03-21 ENCOUNTER — Encounter: Payer: Self-pay | Admitting: Ophthalmology

## 2023-03-21 ENCOUNTER — Ambulatory Visit: Payer: 59 | Admitting: Anesthesiology

## 2023-03-21 ENCOUNTER — Other Ambulatory Visit: Payer: Self-pay

## 2023-03-21 ENCOUNTER — Ambulatory Visit
Admission: RE | Admit: 2023-03-21 | Discharge: 2023-03-21 | Disposition: A | Discharge status: Home / Self Care | Payer: 59 | Attending: Ophthalmology | Admitting: Ophthalmology

## 2023-03-21 ENCOUNTER — Encounter
Admission: RE | Disposition: A | Discharge status: Home / Self Care | Payer: Self-pay | Source: Home / Self Care | Attending: Ophthalmology

## 2023-03-21 DIAGNOSIS — H2512 Age-related nuclear cataract, left eye: Secondary | ICD-10-CM | POA: Insufficient documentation

## 2023-03-21 DIAGNOSIS — K219 Gastro-esophageal reflux disease without esophagitis: Secondary | ICD-10-CM | POA: Diagnosis not present

## 2023-03-21 DIAGNOSIS — I10 Essential (primary) hypertension: Secondary | ICD-10-CM | POA: Diagnosis not present

## 2023-03-21 DIAGNOSIS — Z7984 Long term (current) use of oral hypoglycemic drugs: Secondary | ICD-10-CM | POA: Insufficient documentation

## 2023-03-21 DIAGNOSIS — Z794 Long term (current) use of insulin: Secondary | ICD-10-CM | POA: Diagnosis not present

## 2023-03-21 DIAGNOSIS — E1136 Type 2 diabetes mellitus with diabetic cataract: Secondary | ICD-10-CM | POA: Insufficient documentation

## 2023-03-21 HISTORY — PX: CATARACT EXTRACTION W/PHACO: SHX586

## 2023-03-21 HISTORY — DX: Non-pressure chronic ulcer of other part of unspecified foot with unspecified severity: L97.509

## 2023-03-21 LAB — GLUCOSE, CAPILLARY: Glucose-Capillary: 214 mg/dL — ABNORMAL HIGH (ref 70–99)

## 2023-03-21 SURGERY — PHACOEMULSIFICATION, CATARACT, WITH IOL INSERTION
Anesthesia: Monitor Anesthesia Care | Site: Eye | Laterality: Left

## 2023-03-21 MED ORDER — SIGHTPATH DOSE#1 BSS IO SOLN
INTRAOCULAR | Status: DC | PRN
  Administered 2023-03-21: 2 mL

## 2023-03-21 MED ORDER — TETRACAINE HCL 0.5 % OP SOLN
1.0000 [drp] | OPHTHALMIC | Status: DC | PRN
  Administered 2023-03-21 (×3): 1 [drp] via OPHTHALMIC

## 2023-03-21 MED ORDER — LACTATED RINGERS IV SOLN: INTRAVENOUS | Status: DC

## 2023-03-21 MED ORDER — SIGHTPATH DOSE#1 NA HYALUR & NA CHOND-NA HYALUR IO KIT
PACK | INTRAOCULAR | Status: DC | PRN
  Administered 2023-03-21: 1 via OPHTHALMIC

## 2023-03-21 MED ORDER — FENTANYL CITRATE (PF) 100 MCG/2ML IJ SOLN
INTRAMUSCULAR | Status: DC | PRN
  Administered 2023-03-21: 100 ug via INTRAVENOUS

## 2023-03-21 MED ORDER — BRIMONIDINE TARTRATE-TIMOLOL 0.2-0.5 % OP SOLN
OPHTHALMIC | Status: DC | PRN
  Administered 2023-03-21: 1 [drp] via OPHTHALMIC

## 2023-03-21 MED ORDER — SIGHTPATH DOSE#1 BSS IO SOLN
INTRAOCULAR | Status: DC | PRN
  Administered 2023-03-21: 75 mL via OPHTHALMIC

## 2023-03-21 MED ORDER — ARMC OPHTHALMIC DILATING DROPS
1.0000 | OPHTHALMIC | Status: DC | PRN
  Administered 2023-03-21 (×3): 1 via OPHTHALMIC

## 2023-03-21 MED ORDER — SIGHTPATH DOSE#1 BSS IO SOLN
INTRAOCULAR | Status: DC | PRN
  Administered 2023-03-21: 15 mL via INTRAOCULAR

## 2023-03-21 MED ORDER — MIDAZOLAM HCL 2 MG/2ML IJ SOLN
INTRAMUSCULAR | Status: DC | PRN
  Administered 2023-03-21: 2 mg via INTRAVENOUS

## 2023-03-21 MED ORDER — CEFUROXIME OPHTHALMIC INJECTION 1 MG/0.1 ML
INJECTION | OPHTHALMIC | Status: DC | PRN
  Administered 2023-03-21: .1 mL via INTRACAMERAL

## 2023-03-21 SURGICAL SUPPLY — 11 items
CANNULA ANT/CHMB 27G (MISCELLANEOUS) IMPLANT
CANNULA ANT/CHMB 27GA (MISCELLANEOUS)
CATARACT SUITE SIGHTPATH (MISCELLANEOUS) ×1
FEE CATARACT SUITE SIGHTPATH (MISCELLANEOUS) ×1 IMPLANT
GLOVE SRG 8 PF TXTR STRL LF DI (GLOVE) ×1 IMPLANT
GLOVE SURG ENC TEXT LTX SZ7.5 (GLOVE) ×1 IMPLANT
GLOVE SURG UNDER POLY LF SZ8 (GLOVE) ×1
LENS IOL TECNIS EYHANCE 22.0 (Intraocular Lens) IMPLANT
NDL FILTER BLUNT 18X1 1/2 (NEEDLE) ×1 IMPLANT
NEEDLE FILTER BLUNT 18X1 1/2 (NEEDLE) ×1
SYR 3ML LL SCALE MARK (SYRINGE) ×1 IMPLANT

## 2023-03-21 NOTE — Transfer of Care (Signed)
Immediate Anesthesia Transfer of Care Note  Patient: [name]  Procedure(s) Performed: CATARACT EXTRACTION PHACO AND INTRAOCULAR LENS PLACEMENT (IOC) LEFT MALYUGIN DIABETIC 9.88 1:01.4 (Left: Eye)  Patient Location: PACU  Anesthesia Type: MAC  Level of Consciousness: awake, alert  and patient cooperative  Airway and Oxygen Therapy: Patient Spontanous Breathing and Patient connected to supplemental oxygen  Post-op Assessment: Post-op Vital signs reviewed, Patient's Cardiovascular Status Stable, Respiratory Function Stable, Patent Airway and No signs of Nausea or vomiting  Post-op Vital Signs: Reviewed and stable  Complications: No notable events documented.

## 2023-03-21 NOTE — H&P (Signed)
[facility]   Primary Care Physician:  Karie Schwalbe, MD Ophthalmologist: Dr. Lockie Mola  Pre-Procedure History & Physical: HPI:  [name] is a 67 y.o. male here for ophthalmic surgery.   Past Medical History:  Diagnosis Date   Adenomatous polyp    Allergy    Anemia    Arthritis    feet    Bunion 04/02/2013   STATUS POST OP BUN REPAIR   Diabetes mellitus    Foot ulcer (HCC)    GERD (gastroesophageal reflux disease)    Gout    Hammertoe    Hyperlipidemia    Hypertension    Neuromuscular disorder (HCC)    neuropathy   Plantar flexed metatarsal    Psoriasis    Schatzki's ring     Past Surgical History:  Procedure Laterality Date   CARPAL TUNNEL RELEASE Left 08/24/2022   Procedure: CARPAL TUNNEL RELEASE;  Surgeon: Deeann Saint, MD;  Location: [facility] ORS;  Service: Orthopedics;  Laterality: Left;   COLONOSCOPY     FOOT AMPUTATION Right 01/11/2017   Dr Deborah Chalk ([facility])--transmetatarsal   FOOT SURGERY Right 02/28/2013   Arta Bruce HAM TOE2,3 PINS ,2ND MET OSTEOTOMY, 5TH MET W/SCREW   02-2013, 04-2013   HIP FRACTURE SURGERY  1993   surgery x2   NASAL SEPTOPLASTY W/ TURBINOPLASTY Bilateral 02/08/2023   Procedure: NASAL SEPTOPLASTY WITH TURBINATE REDUCTION;  Surgeon: Vernie Murders, MD;  Location: [facility];  Service: ENT;  Laterality: Bilateral;   POLYPECTOMY     PROSTATE BIOPSY  10/10/2019   TOTAL HIP ARTHROPLASTY     Left hip replacement/femur fx 07/04, Screw removal left hip- [facility] 11/01    Prior to Admission medications   Medication Sig Start Date End Date Taking? Authorizing Provider  Alpha-Lipoic Acid 600 MG TABS Take by mouth. 2 in am and 1 in pm   Yes [provider]  amLODipine (NORVASC) 5 MG tablet TAKE 1 TABLET(5 MG) BY MOUTH DAILY 05/15/22  Yes Tillman Abide I, MD  B-D UF III MINI PEN NEEDLES 31G X 5 MM MISC USE AS DIRECTED DAILY 01/30/23  Yes Tillman Abide I, MD  calcipotriene (DOVONOX) 0.005 % ointment Apply topically 2  (two) times daily. 12/15/22  Yes [provider]  CALCIUM-MAGNESIUM-ZINC PO Take by mouth.   Yes [provider]  Cinnamon 500 MG capsule Take 500 mg by mouth 2 (two) times daily.   Yes [provider]  clobetasol ointment (TEMOVATE) 0.05 % Apply topically. 11/30/22  Yes [provider]  CONTOUR NEXT TEST test strip USE 1 TO 2 TIMES DAILY TO  CHECK BLOOD SUGAR 07/11/22  Yes Karie Schwalbe, MD  cyanocobalamin (VITAMIN B12) 1000 MCG/ML injection 1 ml injection- Weekly x 3; and then once a month. 10/31/22  Yes Earna Coder, MD  DULoxetine (CYMBALTA) 60 MG capsule Take 1 capsule (60 mg total) by mouth daily. 09/18/22  Yes Karie Schwalbe, MD  finasteride (PROSCAR) 5 MG tablet TAKE 1 TABLET BY MOUTH DAILY 01/30/23  Yes Vanna Scotland, MD  furosemide (LASIX) 20 MG tablet Take 1 tablet (20 mg total) by mouth daily as needed. 02/14/19  Yes Karie Schwalbe, MD  hydrocortisone valerate ointment (WEST-CORT) 0.2 % APPLY TOPICALLY TO AFFECTED  AREA(S) TWICE DAILY 12/27/22  Yes Karie Schwalbe, MD  hydrOXYzine (VISTARIL) 25 MG capsule Take 1 capsule (25 mg total) by mouth every 8 (eight) hours as needed for itching. 09/18/22  Yes Karie Schwalbe, MD  indomethacin (INDOCIN) 25  MG capsule Take 25 mg by mouth.   Yes [provider]  insulin glargine (LANTUS SOLOSTAR) 100 UNIT/ML Solostar Pen Inject 15 Units into the skin daily. 12/20/22  Yes Karie Schwalbe, MD  Ixekizumab (TALTZ) 80 MG/ML SOAJ Inject 80 mg into the skin every 28 (twenty-eight) days. For maintenance. 10/11/22  Yes Deirdre Evener, MD  losartan (COZAAR) 25 MG tablet TAKE 1 TABLET(25 MG) BY MOUTH DAILY 02/14/23  Yes Karie Schwalbe, MD  meloxicam (MOBIC) 15 MG tablet Take 15 mg by mouth daily.   Yes [provider]  metFORMIN (GLUCOPHAGE) 1000 MG tablet TAKE 1 TABLET BY MOUTH TWICE  DAILY WITH MEALS 01/30/23  Yes Tillman Abide I, MD  omeprazole (PRILOSEC) 20 MG capsule TAKE 1 CAPSULE BY  MOUTH TWICE  DAILY BEFORE MEALS 12/27/22  Yes Tillman Abide I, MD  simvastatin (ZOCOR) 40 MG tablet TAKE 1 TABLET BY MOUTH AT  BEDTIME 01/30/23  Yes Letvak, Richard I, MD  SYRINGE-NEEDLE, DISP, 3 ML (BD SAFETYGLIDE SYRINGE/NEEDLE) 25G X 1" 3 ML MISC Use the needle for IM injection once a  month; or as directed. 10/31/22  Yes Earna Coder, MD  tamsulosin (FLOMAX) 0.4 MG CAPS capsule TAKE 1 CAPSULE BY MOUTH DAILY 01/30/23  Yes Vanna Scotland, MD  triamcinolone lotion (KENALOG) 0.1 % APPLY TO AFFECTED AREA(S)  TOPICALLY 3 TIMES DAILY 01/30/23  Yes Karie Schwalbe, MD    Allergies as of 02/28/2023 - Review Complete 02/08/2023  Allergen Reaction Noted   Glipizide Other (See Comments) 09/03/2006   Lisinopril Other (See Comments) 04/15/2014   Ramipril Cough 09/03/2006   Ibuprofen  09/03/2006   Bactrim [sulfamethoxazole-trimethoprim] Nausea And Vomiting and Rash 06/19/2022    Family History  Problem Relation Age of Onset   Diabetes Mother    Diabetes Brother    Alzheimer's disease Maternal Aunt    Alzheimer's disease Cousin    Cancer Neg Hx    Colon cancer Neg Hx    Colon polyps Neg Hx    Rectal cancer Neg Hx    Stomach cancer Neg Hx    Esophageal cancer Neg Hx     Social History   Socioeconomic History   Marital status: Married    Spouse name: Not on file   Number of children: 3   Years of education: Not on file   Highest education level: Not on file  Occupational History   Occupation: PC ADMINISTRATOR    Employer: [employer]  Tobacco Use   Smoking status: Never    Passive exposure: Past   Smokeless tobacco: Never  Vaping Use   Vaping status: Never Used  Substance and Sexual Activity   Alcohol use: Yes    Alcohol/week: 1.0 standard drink of alcohol    Types: 1 Standard drinks or equivalent per week    Comment: occasional (DWI 1991)   Drug use: No   Sexual activity: Yes    Birth control/protection: None  Other Topics Concern   Not on file  Social History Narrative     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 With wife; and 2 dog.     Social Determinants of Health   Financial Resource Strain: Not on file  Food Insecurity: Not on file  Transportation Needs: Not on file  Physical Activity: Not on file  Stress: Not on file  Social Connections: Not on file  Intimate Partner Violence: Not on file    Review of Systems: See  HPI, otherwise negative ROS  Physical Exam: BP (!) 163/87   Temp (!) 97.1 F (36.2 C) (Temporal)   Resp (!) 21   Ht 5' 10.98" (1.803 m)   Wt 100.3 kg   SpO2 96%   BMI 30.85 kg/m  General:   Alert,  pleasant and cooperative in NAD Head:  Normocephalic and atraumatic. Lungs:  Clear to auscultation.    Heart:  Regular rate and rhythm.   Impression/Plan: [name] is here for ophthalmic surgery.  Risks, benefits, limitations, and alternatives regarding ophthalmic surgery have been reviewed with the patient.  Questions have been answered.  All parties agreeable.   Lockie Mola, MD  03/21/2023, 7:34 AM

## 2023-03-21 NOTE — Anesthesia Postprocedure Evaluation (Signed)
Anesthesia Post Note  Patient: JETON SIBBETT  Procedure(s) Performed: CATARACT EXTRACTION PHACO AND INTRAOCULAR LENS PLACEMENT (IOC) LEFT MALYUGIN DIABETIC 9.88 1:01.4 (Left: Eye)  Patient location during evaluation: PACU Anesthesia Type: MAC Level of consciousness: awake and alert Pain management: pain level controlled Vital Signs Assessment: post-procedure vital signs reviewed and stable Respiratory status: spontaneous breathing, nonlabored ventilation, respiratory function stable and patient connected to nasal cannula oxygen Cardiovascular status: stable and blood pressure returned to baseline Postop Assessment: no apparent nausea or vomiting Anesthetic complications: no   No notable events documented.   Last Vitals:  Vitals:   03/21/23 0850 03/21/23 0855  BP: (!) 130/94 (!) 151/84  Pulse: 96 98  Resp: 17 20  Temp: (!) 36.1 C (!) 36.1 C  SpO2: 96% 95%    Last Pain:  Vitals:   03/21/23 0855  TempSrc:   PainSc: 0-No pain                 Marisue Humble

## 2023-03-21 NOTE — Op Note (Signed)
OPERATIVE NOTE  KAISON PAS [id number] 03/21/2023  PREOPERATIVE DIAGNOSIS:   Nuclear sclerotic cataract left eye with miotic pupil      H25.12   POSTOPERATIVE DIAGNOSIS:   Nuclear sclerotic cataract left eye with miotic pupil.     PROCEDURE:  Phacoemulsification with posterior chamber intraocular lens implantation of the left eye which required pupil stretching with the Malyugin pupil expansion device  Ultrasound time: Procedure(s): CATARACT EXTRACTION PHACO AND INTRAOCULAR LENS PLACEMENT (IOC) LEFT MALYUGIN DIABETIC 9.88 1:01.4 (Left)  LENS:   Implant Name Type Inv. Item Serial No. Manufacturer Lot No. LRB No. Used Action  LENS IOL TECNIS EYHANCE 22.0 - [id number] Intraocular Lens LENS IOL TECNIS EYHANCE 22.0 [id number] [employer]  Left 1 Implanted          SURGEON:  Deirdre Evener, MD   ANESTHESIA: Topical with tetracaine drops and 2% Xylocaine jelly, augmented with 1% preservative-free intracameral lidocaine.   COMPLICATIONS:  None.   DESCRIPTION OF PROCEDURE:  The patient was identified in the holding room and transported to the operating room and placed in the supine position under the operating microscope.  The left eye was identified as the operative eye and it was prepped and draped in the usual sterile ophthalmic fashion.   A 1 millimeter clear-corneal paracentesis was made at the 1:30 position.  The anterior chamber was filled with Viscoat viscoelastic.  0.5 ml of preservative-free 1% lidocaine was injected into the anterior chamber.  A 2.4 millimeter keratome was used to make a near-clear corneal incision at the 10:30 position.  A Malyugin pupil expander was then placed through the main incision and into the anterior chamber of the eye.  The edge of the iris was secured on the lip of the pupil expander and it was released, thereby expanding the pupil to approximately 7 millimeters for completion of the cataract surgery.  Additional Viscoat was placed in the anterior  chamber.  A cystotome and capsulorrhexis forceps were used to make a curvilinear capsulorrhexis.   Balanced salt solution was used to hydrodissect and hydrodelineate the lens nucleus.   Phacoemulsification was used in stop and chop fashion to remove the lens, nucleus and epinucleus.  The remaining cortex was aspirated using the irrigation aspiration handpiece.  Additional Provisc was placed into the eye to distend the capsular bag for lens placement.  A lens was then injected into the capsular bag.  The pupil expanding ring was removed using a Kuglen hook and insertion device. The remaining viscoelastic was aspirated from the capsular bag and the anterior chamber.  The anterior chamber was filled with balanced salt solution to inflate to a physiologic pressure.   Wounds were hydrated with balanced salt solution.  The anterior chamber was inflated to a physiologic pressure with balanced salt solution.  No wound leaks were noted. Cefuroxime 0.1 ml of a 10mg /ml solution was injected into the anterior chamber for a dose of 1 mg of intracameral antibiotic at the completion of the case.   Timolol and Brimonidine drops were applied to the eye.  The patient was taken to the recovery room in stable condition without complications of anesthesia or surgery.  Trinna Kunst 03/21/2023, 8:49 AM

## 2023-03-22 ENCOUNTER — Encounter: Payer: Self-pay | Admitting: Ophthalmology

## 2023-03-23 ENCOUNTER — Encounter: Payer: Self-pay | Admitting: Ophthalmology

## 2023-04-03 NOTE — Discharge Instructions (Signed)

## 2023-04-04 ENCOUNTER — Ambulatory Visit: Payer: 59 | Admitting: Anesthesiology

## 2023-04-04 ENCOUNTER — Encounter: Payer: Self-pay | Admitting: Ophthalmology

## 2023-04-04 ENCOUNTER — Other Ambulatory Visit: Payer: Self-pay

## 2023-04-04 ENCOUNTER — Encounter
Admission: RE | Disposition: A | Discharge status: Home / Self Care | Payer: Self-pay | Source: Home / Self Care | Attending: Ophthalmology

## 2023-04-04 ENCOUNTER — Ambulatory Visit
Admission: RE | Admit: 2023-04-04 | Discharge: 2023-04-04 | Disposition: A | Discharge status: Home / Self Care | Payer: 59 | Attending: Ophthalmology | Admitting: Ophthalmology

## 2023-04-04 DIAGNOSIS — E1136 Type 2 diabetes mellitus with diabetic cataract: Secondary | ICD-10-CM | POA: Insufficient documentation

## 2023-04-04 DIAGNOSIS — I10 Essential (primary) hypertension: Secondary | ICD-10-CM | POA: Diagnosis not present

## 2023-04-04 DIAGNOSIS — K219 Gastro-esophageal reflux disease without esophagitis: Secondary | ICD-10-CM | POA: Diagnosis not present

## 2023-04-04 DIAGNOSIS — H2511 Age-related nuclear cataract, right eye: Secondary | ICD-10-CM | POA: Insufficient documentation

## 2023-04-04 DIAGNOSIS — H5703 Miosis: Secondary | ICD-10-CM | POA: Diagnosis not present

## 2023-04-04 HISTORY — PX: CATARACT EXTRACTION W/PHACO: SHX586

## 2023-04-04 LAB — GLUCOSE, CAPILLARY: Glucose-Capillary: 178 mg/dL — ABNORMAL HIGH (ref 70–99)

## 2023-04-04 SURGERY — PHACOEMULSIFICATION, CATARACT, WITH IOL INSERTION
Anesthesia: Monitor Anesthesia Care | Laterality: Right

## 2023-04-04 MED ORDER — MIDAZOLAM HCL 2 MG/2ML IJ SOLN
INTRAMUSCULAR | Status: DC | PRN
  Administered 2023-04-04: 2 mg via INTRAVENOUS

## 2023-04-04 MED ORDER — SIGHTPATH DOSE#1 BSS IO SOLN
INTRAOCULAR | Status: DC | PRN
  Administered 2023-04-04: 2 mL

## 2023-04-04 MED ORDER — SIGHTPATH DOSE#1 BSS IO SOLN
INTRAOCULAR | Status: DC | PRN
  Administered 2023-04-04: 15 mL via INTRAOCULAR

## 2023-04-04 MED ORDER — TETRACAINE HCL 0.5 % OP SOLN
1.0000 [drp] | OPHTHALMIC | Status: DC | PRN
  Administered 2023-04-04 (×3): 1 [drp] via OPHTHALMIC

## 2023-04-04 MED ORDER — FENTANYL CITRATE (PF) 100 MCG/2ML IJ SOLN
INTRAMUSCULAR | Status: DC | PRN
  Administered 2023-04-04: 100 ug via INTRAVENOUS

## 2023-04-04 MED ORDER — ARMC OPHTHALMIC DILATING DROPS
1.0000 | OPHTHALMIC | Status: DC | PRN
  Administered 2023-04-04 (×3): 1 via OPHTHALMIC

## 2023-04-04 MED ORDER — SIGHTPATH DOSE#1 NA HYALUR & NA CHOND-NA HYALUR IO KIT
PACK | INTRAOCULAR | Status: DC | PRN
  Administered 2023-04-04: 1 via OPHTHALMIC

## 2023-04-04 MED ORDER — SIGHTPATH DOSE#1 BSS IO SOLN
INTRAOCULAR | Status: DC | PRN
  Administered 2023-04-04: 62 mL via OPHTHALMIC

## 2023-04-04 MED ORDER — LACTATED RINGERS IV SOLN: INTRAVENOUS | Status: DC

## 2023-04-04 MED ORDER — CEFUROXIME OPHTHALMIC INJECTION 1 MG/0.1 ML
INJECTION | OPHTHALMIC | Status: DC | PRN
  Administered 2023-04-04: 1 mg via INTRACAMERAL

## 2023-04-04 MED ORDER — BRIMONIDINE TARTRATE-TIMOLOL 0.2-0.5 % OP SOLN
OPHTHALMIC | Status: DC | PRN
  Administered 2023-04-04: 1 [drp] via OPHTHALMIC

## 2023-04-04 SURGICAL SUPPLY — 10 items
CATARACT SUITE SIGHTPATH (MISCELLANEOUS) ×1
FEE CATARACT SUITE SIGHTPATH (MISCELLANEOUS) ×1 IMPLANT
GLOVE SRG 8 PF TXTR STRL LF DI (GLOVE) ×1 IMPLANT
GLOVE SURG ENC TEXT LTX SZ7.5 (GLOVE) ×1 IMPLANT
GLOVE SURG UNDER POLY LF SZ8 (GLOVE) ×1
LENS IOL TECNIS EYHANCE 22.0 (Intraocular Lens) IMPLANT
NDL FILTER BLUNT 18X1 1/2 (NEEDLE) ×1 IMPLANT
NEEDLE FILTER BLUNT 18X1 1/2 (NEEDLE) ×1
RING MALYGIN 7.0 (MISCELLANEOUS) IMPLANT
SYR 3ML LL SCALE MARK (SYRINGE) ×1 IMPLANT

## 2023-04-04 NOTE — Anesthesia Postprocedure Evaluation (Signed)
Anesthesia Post Note  Patient: [name]  Procedure(s) Performed: CATARACT EXTRACTION PHACO AND INTRAOCULAR LENS PLACEMENT (IOC) RIGHT MALYUGIN DIABETIC 15.31 01:15.1 (Right)  Patient location during evaluation: PACU Anesthesia Type: MAC Level of consciousness: awake and alert Pain management: pain level controlled Vital Signs Assessment: post-procedure vital signs reviewed and stable Respiratory status: spontaneous breathing, nonlabored ventilation, respiratory function stable and patient connected to nasal cannula oxygen Cardiovascular status: stable and blood pressure returned to baseline Postop Assessment: no apparent nausea or vomiting Anesthetic complications: no   No notable events documented.   Last Vitals:  Vitals:   04/04/23 1015 04/04/23 1018  BP: 130/82 134/85  Pulse: 97   Resp: 18   Temp: (!) 36.4 C   SpO2: 99% 100%    Last Pain:  Vitals:   04/04/23 1018  TempSrc:   PainSc: 0-No pain                 Lenard Simmer

## 2023-04-04 NOTE — Anesthesia Preprocedure Evaluation (Signed)
Anesthesia Evaluation  Patient identified by MRN, date of birth, ID band Patient awake    Reviewed: Allergy & Precautions, H&P , NPO status , Patient's Chart, lab work & pertinent test results  Airway Mallampati: III  TM Distance: >3 FB Neck ROM: Full    Dental no notable dental hx. (+) Chipped  Chipped One missing, one capped but not in upper front teeth:  :   Pulmonary neg pulmonary ROS   Pulmonary exam normal breath sounds clear to auscultation       Cardiovascular hypertension, negative cardio ROS Normal cardiovascular exam Rhythm:Regular Rate:Normal     Neuro/Psych  Neuromuscular disease negative neurological ROS  negative psych ROS   GI/Hepatic negative GI ROS, Neg liver ROS,GERD  ,,  Endo/Other  negative endocrine ROSdiabetes    Renal/GU negative Renal ROS  negative genitourinary   Musculoskeletal negative musculoskeletal ROS (+) Arthritis ,    Abdominal   Peds negative pediatric ROS (+)  Hematology negative hematology ROS (+) Blood dyscrasia, anemia   Anesthesia Other Findings GERD (gastroesophageal reflux disease) Gout Hyperlipidemia  Hypertension Psoriasis  Hammertoe Bunion  Plantar flexed metatarsal Adenomatous polyp  Bilateral open foot ulcers Arthritis Allergy Neuromuscular disorder (HCC) Diabetes mellitus  Anemia Schatzki's ring   Previous nasal septoplasty 02-04-23 with Dr. Juel Burrow anesthesiologist  Reproductive/Obstetrics negative OB ROS                             Anesthesia Physical Anesthesia Plan  ASA: 3  Anesthesia Plan: MAC   Post-op Pain Management:    Induction: Intravenous  PONV Risk Score and Plan:   Airway Management Planned: Natural Airway and Nasal Cannula  Additional Equipment:   Intra-op Plan:   Post-operative Plan:   Informed Consent: I have reviewed the patients History and Physical, chart, labs and discussed the procedure  including the risks, benefits and alternatives for the proposed anesthesia with the patient or authorized representative who has indicated his/her understanding and acceptance.     Dental Advisory Given  Plan Discussed with: Anesthesiologist, CRNA and Surgeon  Anesthesia Plan Comments: (Patient consented for risks of anesthesia including but not limited to:  - adverse reactions to medications - damage to eyes, teeth, lips or other oral mucosa - nerve damage due to positioning  - sore throat or hoarseness - Damage to heart, brain, nerves, lungs, other parts of body or loss of life  Patient voiced understanding.)        Anesthesia Quick Evaluation

## 2023-04-04 NOTE — H&P (Signed)
[facility]   Primary Care Physician:  Karie Schwalbe, MD Ophthalmologist: Dr. Lockie Mola  Pre-Procedure History & Physical: HPI:  [name] is a 67 y.o. male here for ophthalmic surgery.   Past Medical History:  Diagnosis Date   Adenomatous polyp    Allergy    Anemia    Arthritis    feet    Bunion 04/02/2013   STATUS POST OP BUN REPAIR   Diabetes mellitus    Foot ulcer (HCC)    GERD (gastroesophageal reflux disease)    Gout    Hammertoe    Hyperlipidemia    Hypertension    Neuromuscular disorder (HCC)    neuropathy   Plantar flexed metatarsal    Psoriasis    Schatzki's ring     Past Surgical History:  Procedure Laterality Date   CARPAL TUNNEL RELEASE Left 08/24/2022   Procedure: CARPAL TUNNEL RELEASE;  Surgeon: Deeann Saint, MD;  Location: [facility];  Service: Orthopedics;  Laterality: Left;   CATARACT EXTRACTION W/PHACO Left 03/21/2023   Procedure: CATARACT EXTRACTION PHACO AND INTRAOCULAR LENS PLACEMENT (IOC) LEFT MALYUGIN DIABETIC 9.88 1:01.4;  Surgeon: Lockie Mola, MD;  Location: [facility];  Service: Ophthalmology;  Laterality: Left;   COLONOSCOPY     FOOT AMPUTATION Right 01/11/2017   Dr Deborah Chalk ([facility])--transmetatarsal   FOOT SURGERY Right 02/28/2013   Arta Bruce HAM TOE2,3 PINS ,2ND MET OSTEOTOMY, 5TH MET W/SCREW   02-2013, 04-2013   HIP FRACTURE SURGERY  1993   surgery x2   NASAL SEPTOPLASTY W/ TURBINOPLASTY Bilateral 02/08/2023   Procedure: NASAL SEPTOPLASTY WITH TURBINATE REDUCTION;  Surgeon: Vernie Murders, MD;  Location: [facility];  Service: ENT;  Laterality: Bilateral;   POLYPECTOMY     PROSTATE BIOPSY  10/10/2019   TOTAL HIP ARTHROPLASTY     Left hip replacement/femur fx 07/04, Screw removal left hip- [facility] 11/01    Prior to Admission medications   Medication Sig Start Date End Date Taking? Authorizing Provider  Alpha-Lipoic Acid 600 MG TABS Take by mouth. 2 in am and 1 in pm   Yes [provider]  amLODipine (NORVASC) 5 MG tablet TAKE 1 TABLET(5 MG) BY MOUTH DAILY 05/15/22  Yes Tillman Abide I, MD  B-D UF III MINI PEN NEEDLES 31G X 5 MM MISC USE AS DIRECTED DAILY 01/30/23  Yes Tillman Abide I, MD  calcipotriene (DOVONOX) 0.005 % ointment Apply topically 2 (two) times daily. 12/15/22  Yes [provider]  CALCIUM-MAGNESIUM-ZINC PO Take by mouth.   Yes [provider]  Cinnamon 500 MG capsule Take 500 mg by mouth 2 (two) times daily.   Yes [provider]  clobetasol ointment (TEMOVATE) 0.05 % Apply topically. 11/30/22  Yes [provider]  CONTOUR NEXT TEST test strip USE 1 TO 2 TIMES DAILY TO  CHECK BLOOD SUGAR 07/11/22  Yes Karie Schwalbe, MD  cyanocobalamin (VITAMIN B12) 1000 MCG/ML injection 1 ml injection- Weekly x 3; and then once a month. 10/31/22  Yes Earna Coder, MD  DULoxetine (CYMBALTA) 60 MG capsule Take 1 capsule (60 mg total) by mouth daily. 09/18/22  Yes Karie Schwalbe, MD  finasteride (PROSCAR) 5 MG tablet TAKE 1 TABLET BY MOUTH DAILY 01/30/23  Yes Vanna Scotland, MD  furosemide (LASIX) 20 MG tablet Take 1 tablet (20 mg total) by mouth daily as needed. 02/14/19  Yes Karie Schwalbe, MD  hydrocortisone valerate ointment (WEST-CORT) 0.2 % APPLY TOPICALLY TO AFFECTED  AREA(S) TWICE DAILY 12/27/22  Yes Karie Schwalbe, MD  hydrOXYzine (VISTARIL) 25 MG capsule Take 1 capsule (25 mg total) by mouth every 8 (eight) hours as needed for itching. 09/18/22  Yes Karie Schwalbe, MD  indomethacin (INDOCIN) 25 MG capsule Take 25 mg by mouth.   Yes [provider]  insulin glargine (LANTUS SOLOSTAR) 100 UNIT/ML Solostar Pen Inject 15 Units into the skin daily. 12/20/22  Yes Karie Schwalbe, MD  Ixekizumab (TALTZ) 80 MG/ML SOAJ Inject 80 mg into the skin every 28 (twenty-eight) days. For maintenance. 10/11/22  Yes Deirdre Evener, MD  losartan (COZAAR) 25 MG tablet TAKE 1 TABLET(25 MG) BY MOUTH DAILY 02/14/23  Yes Karie Schwalbe, MD  meloxicam (MOBIC) 15 MG tablet Take 15 mg by mouth daily.   Yes [provider]  metFORMIN (GLUCOPHAGE) 1000 MG tablet TAKE 1 TABLET BY MOUTH TWICE  DAILY WITH MEALS 01/30/23  Yes Tillman Abide I, MD  omeprazole (PRILOSEC) 20 MG capsule TAKE 1 CAPSULE BY MOUTH TWICE  DAILY BEFORE MEALS 12/27/22  Yes Tillman Abide I, MD  simvastatin (ZOCOR) 40 MG tablet TAKE 1 TABLET BY MOUTH AT  BEDTIME 01/30/23  Yes Letvak, Richard I, MD  SYRINGE-NEEDLE, DISP, 3 ML (BD SAFETYGLIDE SYRINGE/NEEDLE) 25G X 1" 3 ML MISC Use the needle for IM injection once a  month; or as directed. 10/31/22  Yes Earna Coder, MD  tamsulosin (FLOMAX) 0.4 MG CAPS capsule TAKE 1 CAPSULE BY MOUTH DAILY 01/30/23  Yes Vanna Scotland, MD  triamcinolone lotion (KENALOG) 0.1 % APPLY TO AFFECTED AREA(S)  TOPICALLY 3 TIMES DAILY 01/30/23  Yes Karie Schwalbe, MD    Allergies as of 02/28/2023 - Review Complete 02/08/2023  Allergen Reaction Noted   Glipizide Other (See Comments) 09/03/2006   Lisinopril Other (See Comments) 04/15/2014   Ramipril Cough 09/03/2006   Ibuprofen  09/03/2006   Bactrim [sulfamethoxazole-trimethoprim] Nausea And Vomiting and Rash 06/19/2022    Family History  Problem Relation Age of Onset   Diabetes Mother    Diabetes Brother    Alzheimer's disease Maternal Aunt    Alzheimer's disease Cousin    Cancer Neg Hx    Colon cancer Neg Hx    Colon polyps Neg Hx    Rectal cancer Neg Hx    Stomach cancer Neg Hx    Esophageal cancer Neg Hx     Social History   Socioeconomic History   Marital status: Married    Spouse name: Not on file   Number of children: 3   Years of education: Not on file   Highest education level: Not on file  Occupational History   Occupation: PC ADMINISTRATOR    Employer: [employer]  Tobacco Use   Smoking status: Never    Passive exposure: Past   Smokeless tobacco: Never  Vaping Use   Vaping status: Never Used  Substance and Sexual Activity   Alcohol use: Yes     Alcohol/week: 1.0 standard drink of alcohol    Types: 1 Standard drinks or equivalent per week    Comment: occasional (DWI 1991)   Drug use: No   Sexual activity: Yes    Birth control/protection: None  Other Topics Concern   Not on file  Social History Narrative    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 With wife; and 2 dog.     Social Determinants of Health   Financial Resource Strain: Not on file  Food Insecurity: Not on  file  Transportation Needs: Not on file  Physical Activity: Not on file  Stress: Not on file  Social Connections: Not on file  Intimate Partner Violence: Not on file    Review of Systems: See HPI, otherwise negative ROS  Physical Exam: BP (!) 156/88   Temp (!) 97.3 F (36.3 C) (Temporal)   Resp 18   Ht 6' 0.01" (1.829 m)   Wt 99.8 kg   SpO2 97%   BMI 29.84 kg/m  General:   Alert,  pleasant and cooperative in NAD Head:  Normocephalic and atraumatic. Lungs:  Clear to auscultation.    Heart:  Regular rate and rhythm.   Impression/Plan: [name] is here for ophthalmic surgery.  Risks, benefits, limitations, and alternatives regarding ophthalmic surgery have been reviewed with the patient.  Questions have been answered.  All parties agreeable.   Lockie Mola, MD  04/04/2023, 9:24 AM

## 2023-04-04 NOTE — Transfer of Care (Signed)
Immediate Anesthesia Transfer of Care Note  Patient: [name]  Procedure(s) Performed: CATARACT EXTRACTION PHACO AND INTRAOCULAR LENS PLACEMENT (IOC) RIGHT MALYUGIN DIABETIC 15.31 01:15.1 (Right)  Patient Location: PACU  Anesthesia Type: MAC  Level of Consciousness: awake, alert  and patient cooperative  Airway and Oxygen Therapy: Patient Spontanous Breathing and Patient connected to supplemental oxygen  Post-op Assessment: Post-op Vital signs reviewed, Patient's Cardiovascular Status Stable, Respiratory Function Stable, Patent Airway and No signs of Nausea or vomiting  Post-op Vital Signs: Reviewed and stable  Complications: No notable events documented.

## 2023-04-04 NOTE — Op Note (Signed)
OPERATIVE NOTE  [name] [id number] 04/04/2023   PREOPERATIVE DIAGNOSIS:    Nuclear Sclerotic Cataract Right eye with miotic pupil.        H25.11  POSTOPERATIVE DIAGNOSIS: Nuclear Sclerotic Cataract Right eye with miotic pupil.          PROCEDURE:  Phacoemusification with posterior chamber intraocular lens placement of the right eye which required pupil stretching with the Malyugin pupil expansion device. Ultrasound time: Procedure(s): CATARACT EXTRACTION PHACO AND INTRAOCULAR LENS PLACEMENT (IOC) RIGHT MALYUGIN DIABETIC 15.31 01:15.1 (Right)  LENS:   Implant Name Type Inv. Item Serial No. Manufacturer Lot No. LRB No. Used Action  LENS IOL TECNIS EYHANCE 22.0 - [id number] Intraocular Lens LENS IOL TECNIS EYHANCE 22.0 [id number] [employer]  Right 1 Implanted      SURGEON:  Deirdre Evener, MD   ANESTHESIA:  Topical with tetracaine drops and 2% Xylocaine jelly, augmented with 1% preservative-free intracameral lidocaine.   COMPLICATIONS:  None.   DESCRIPTION OF PROCEDURE:  The patient was identified in the holding room and transported to the operating room and placed in the supine position under the operating microscope. The right eye was identified as the operative eye and it was prepped and draped in the usual sterile ophthalmic fashion.   A 1 millimeter clear-corneal paracentesis was made at the 12:00 position.  0.5 ml of preservative-free 1% lidocaine was injected into the anterior chamber. The anterior chamber was filled with Viscoat viscoelastic.  A 2.4 millimeter keratome was used to make a near-clear corneal incision at the 9:00 position. A Malyugin pupil expander was then placed through the main incision and into the anterior chamber of the eye.  The edge of the iris was secured on the lip of the pupil expander and it was released, thereby expanding the pupil to approximately 7 millimeters for completion of the cataract surgery.  Additional Viscoat was placed in the  anterior chamber.  A cystotome and capsulorrhexis forceps were used to make a curvilinear capsulorrhexis.   Balanced salt solution was used to hydrodissect and hydrodelineate the lens nucleus.   Phacoemulsification was used in stop and chop fashion to remove the lens, nucleus and epinucleus.  The remaining cortex was aspirated using the irrigation aspiration handpiece.  Additional Provisc was placed into the eye to distend the capsular bag for lens placement.  A lens was then injected into the capsular bag.  The pupil expanding ring was removed using a Kuglen hook and insertion device. The remaining viscoelastic was aspirated from the capsular bag and the anterior chamber.  The anterior chamber was filled with balanced salt solution to inflate to a physiologic pressure.  Wounds were hydrated with balanced salt solution.  The anterior chamber was inflated to a physiologic pressure with balanced salt solution.  No wound leaks were noted.Cefuroxime 0.1 ml of a 10mg /ml solution was injected into the anterior chamber for a dose of 1 mg of intracameral antibiotic at the completion of the case. Timolol and Brimonidine drops were applied to the eye.  The patient was taken to the recovery room in stable condition without complications of anesthesia or surgery.  [name] 04/04/2023, 10:10 AM

## 2023-04-16 ENCOUNTER — Encounter: Payer: Self-pay | Admitting: Nurse Practitioner

## 2023-04-18 NOTE — Progress Notes (Unsigned)
[facility] CONSULT NOTE  Patient Care Team: Karie Schwalbe, MD as PCP - General Donneta Romberg Worthy Flank, MD as Consulting Physician (Oncology)  CHIEF COMPLAINTS/PURPOSE OF CONSULTATION: ANEMIA   HEMATOLOGY HISTORY  # SEP 2023- ANEMIA MCV-platelets WBC-WNL; Iron sat; ferritin;  GFR- CT/US; EGD- in JAn 2024 /colonoscopy-dec 21st, 2023; prior 3-4 year [Dr.[facility]]- DEC 2023-  Iron studies; ferritin no evidence of iron deficiency; myeloma panel kappa lambda light chain; hepatitis screening; LDH haptoglobin; CRP-within normal limit.  Peripheral smear no evidence of schistocytes. APRIl 2024- B12 141. Started B12 injections   #    Latest Reference Range & Units Most Recent 12/14/21 10:08 03/29/22 13:12 06/19/22 15:18 06/28/22 11:16  Hemoglobin 13.0 - 17.7 g/dL 52.8 (L) 41/3/24 40:10 13.4 12.2 (L) 11.9 (L) 12.0 (L)  (L): Data is abnormally low  Latest Reference Range & Units 06/28/22 11:16  Iron 38 - 169 ug/dL 98  UIBC 272 - 536 ug/dL 644  TIBC 034 - 742 ug/dL 595  Ferritin 30 - 638 ng/mL 143  Iron Saturation 15 - 55 % 31    # Psoraiss [Dx- 18 years]- 2023- started TNF   HISTORY OF PRESENTING ILLNESS: Accompanied by his wife ambulating independently.   [name] 67 y.o.  male pleasant patient with mild anemia without any obvious cause- ? B12 deficiency- on B12 injection is here for follow-up.  Complains of dizziness; and fatigue- worsening. No dyspnea; no cough.   Otherwise denies any blood in stools or black or stools.   Also patient complains of falls secondary to unsteady gait.    Review of Systems  Constitutional:  Positive for malaise/fatigue. Negative for chills, diaphoresis, fever and weight loss.  HENT:  Negative for nosebleeds and sore throat.   Eyes:  Negative for double vision.  Respiratory:  Negative for cough, hemoptysis, sputum production, shortness of breath and wheezing.   Cardiovascular:  Negative for chest pain, palpitations, orthopnea  and leg swelling.  Gastrointestinal:  Negative for abdominal pain, blood in stool, constipation, diarrhea, heartburn, melena, nausea and vomiting.  Genitourinary:  Positive for frequency. Negative for dysuria and urgency.  Musculoskeletal:  Negative for back pain and joint pain.  Skin: Negative.  Negative for itching and rash.  Neurological:  Negative for dizziness, tingling, focal weakness, weakness and headaches.  Endo/Heme/Allergies:  Does not bruise/bleed easily.  Psychiatric/Behavioral:  Negative for depression. The patient is not nervous/anxious and does not have insomnia.      MEDICAL HISTORY:  Past Medical History:  Diagnosis Date   Adenomatous polyp    Allergy    Anemia    Arthritis    feet    Bunion 04/02/2013   STATUS POST OP BUN REPAIR   Diabetes mellitus    Foot ulcer (HCC)    GERD (gastroesophageal reflux disease)    Gout    Hammertoe    Hyperlipidemia    Hypertension    Neuromuscular disorder (HCC)    neuropathy   Plantar flexed metatarsal    Psoriasis    Schatzki's ring     SURGICAL HISTORY: Past Surgical History:  Procedure Laterality Date   CARPAL TUNNEL RELEASE Left 08/24/2022   Procedure: CARPAL TUNNEL RELEASE;  Surgeon: Deeann Saint, MD;  Location: [facility];  Service: Orthopedics;  Laterality: Left;   CATARACT EXTRACTION W/PHACO Left 03/21/2023   Procedure: CATARACT EXTRACTION PHACO AND INTRAOCULAR LENS PLACEMENT (IOC) LEFT MALYUGIN DIABETIC 9.88 1:01.4;  Surgeon: Lockie Mola, MD;  Location: [facility];  Service: Ophthalmology;  Laterality: Left;   CATARACT EXTRACTION W/PHACO Right 04/04/2023   Procedure: CATARACT EXTRACTION PHACO AND INTRAOCULAR LENS PLACEMENT (IOC) RIGHT MALYUGIN DIABETIC 15.31 01:15.1;  Surgeon: Lockie Mola, MD;  Location: [facility];  Service: Ophthalmology;  Laterality: Right;   COLONOSCOPY     FOOT AMPUTATION Right 01/11/2017   Dr Deborah Chalk ([facility])--transmetatarsal   FOOT SURGERY Right 02/28/2013    Arta Bruce HAM TOE2,3 PINS ,2ND MET OSTEOTOMY, 5TH MET W/SCREW   02-2013, 04-2013   HIP FRACTURE SURGERY  1993   surgery x2   NASAL SEPTOPLASTY W/ TURBINOPLASTY Bilateral 02/08/2023   Procedure: NASAL SEPTOPLASTY WITH TURBINATE REDUCTION;  Surgeon: Vernie Murders, MD;  Location: [facility];  Service: ENT;  Laterality: Bilateral;   POLYPECTOMY     PROSTATE BIOPSY  10/10/2019   TOTAL HIP ARTHROPLASTY     Left hip replacement/femur fx 07/04, Screw removal left hip- [facility] 11/01    SOCIAL HISTORY: Social History   Socioeconomic History   Marital status: Married    Spouse name: Not on file   Number of children: 3   Years of education: Not on file   Highest education level: Not on file  Occupational History   Occupation: PC ADMINISTRATOR    Employer: [employer]  Tobacco Use   Smoking status: Never    Passive exposure: Past   Smokeless tobacco: Never  Vaping Use   Vaping status: Never Used  Substance and Sexual Activity   Alcohol use: Yes    Alcohol/week: 1.0 standard drink of alcohol    Types: 1 Standard drinks or equivalent per week    Comment: occasional (DWI 1991)   Drug use: No   Sexual activity: Yes    Birth control/protection: None  Other Topics Concern   Not on file  Social History Narrative    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 With wife; and 2 dog.     Social Determinants of Health   Financial Resource Strain: Not on file  Food Insecurity: Not on file  Transportation Needs: Not on file  Physical Activity: Not on file  Stress: Not on file  Social Connections: Not on file  Intimate Partner Violence: Not on file    FAMILY HISTORY: Family History  Problem Relation Age of Onset   Diabetes Mother    Diabetes Brother    Alzheimer's disease Maternal Aunt    Alzheimer's disease Cousin    Cancer Neg Hx    Colon cancer Neg Hx    Colon polyps Neg Hx    Rectal cancer Neg Hx    Stomach cancer Neg Hx     Esophageal cancer Neg Hx     ALLERGIES:  is allergic to glipizide, lisinopril, ramipril, ibuprofen, and bactrim [sulfamethoxazole-trimethoprim].  MEDICATIONS:  Current Outpatient Medications  Medication Sig Dispense Refill   Alpha-Lipoic Acid 600 MG TABS Take by mouth. 2 in am and 1 in pm     amLODipine (NORVASC) 5 MG tablet TAKE 1 TABLET(5 MG) BY MOUTH DAILY 90 tablet 3   B-D UF III MINI PEN NEEDLES 31G X 5 MM MISC USE AS DIRECTED DAILY 90 each 3   calcipotriene (DOVONOX) 0.005 % ointment Apply topically 2 (two) times daily.     CALCIUM-MAGNESIUM-ZINC PO Take by mouth.     Cinnamon 500 MG capsule Take 500 mg by mouth 2 (two) times daily.     clobetasol ointment (TEMOVATE) 0.05 % Apply topically.     CONTOUR NEXT TEST test  strip USE 1 TO 2 TIMES DAILY TO  CHECK BLOOD SUGAR 200 strip 3   DULoxetine (CYMBALTA) 60 MG capsule Take 1 capsule (60 mg total) by mouth daily. 90 capsule 3   finasteride (PROSCAR) 5 MG tablet TAKE 1 TABLET BY MOUTH DAILY 90 tablet 3   furosemide (LASIX) 20 MG tablet Take 1 tablet (20 mg total) by mouth daily as needed. 90 tablet 3   hydrocortisone valerate ointment (WEST-CORT) 0.2 % APPLY TOPICALLY TO AFFECTED  AREA(S) TWICE DAILY 120 g 1   hydrOXYzine (VISTARIL) 25 MG capsule Take 1 capsule (25 mg total) by mouth every 8 (eight) hours as needed for itching. 60 capsule 0   indomethacin (INDOCIN) 25 MG capsule Take 25 mg by mouth.     insulin glargine (LANTUS SOLOSTAR) 100 UNIT/ML Solostar Pen Inject 15 Units into the skin daily. 1 mL 0   Ixekizumab (TALTZ) 80 MG/ML SOAJ Inject 80 mg into the skin every 28 (twenty-eight) days. For maintenance. 1 mL 4   losartan (COZAAR) 25 MG tablet TAKE 1 TABLET(25 MG) BY MOUTH DAILY 90 tablet 3   meloxicam (MOBIC) 15 MG tablet Take 15 mg by mouth daily.     metFORMIN (GLUCOPHAGE) 1000 MG tablet TAKE 1 TABLET BY MOUTH TWICE  DAILY WITH MEALS 180 tablet 3   omeprazole (PRILOSEC) 20 MG capsule TAKE 1 CAPSULE BY MOUTH TWICE  DAILY BEFORE  MEALS 180 capsule 3   simvastatin (ZOCOR) 40 MG tablet TAKE 1 TABLET BY MOUTH AT  BEDTIME 90 tablet 3   SYRINGE-NEEDLE, DISP, 3 ML (BD SAFETYGLIDE SYRINGE/NEEDLE) 25G X 1" 3 ML MISC Use the needle for IM injection once a  month; or as directed. 30 each 1   tamsulosin (FLOMAX) 0.4 MG CAPS capsule TAKE 1 CAPSULE BY MOUTH DAILY 90 capsule 3   triamcinolone lotion (KENALOG) 0.1 % APPLY TO AFFECTED AREA(S)  TOPICALLY 3 TIMES DAILY 180 mL 1   cyanocobalamin (VITAMIN B12) 1000 MCG/ML injection 1 ml injection-once a month. 12 mL 1   No current facility-administered medications for this visit.     Marland Kitchen  PHYSICAL EXAMINATION:   Vitals:   04/19/23 0929  BP: (!) 150/74  Pulse: (!) 115  Temp: 98.1 F (36.7 C)  SpO2: 99%    Filed Weights   04/19/23 0929  Weight: 230 lb 9.6 oz (104.6 kg)     Physical Exam Vitals and nursing note reviewed.  HENT:     Head: Normocephalic and atraumatic.     Mouth/Throat:     Pharynx: Oropharynx is clear.  Eyes:     Extraocular Movements: Extraocular movements intact.     Pupils: Pupils are equal, round, and reactive to light.  Cardiovascular:     Rate and Rhythm: Normal rate and regular rhythm.  Abdominal:     Palpations: Abdomen is soft.  Musculoskeletal:        General: Normal range of motion.     Cervical back: Normal range of motion.  Skin:    General: Skin is warm.  Neurological:     General: No focal deficit present.     Mental Status: He is alert and oriented to person, place, and time.  Psychiatric:        Behavior: Behavior normal.        Judgment: Judgment normal.      LABORATORY DATA:  I have reviewed the data as listed Lab Results  Component Value Date   WBC 4.8 12/20/2022   HGB 11.7 (L) 12/20/2022  HCT 33.9 (L) 12/20/2022   MCV 99 (H) 12/20/2022   PLT 239 12/20/2022   Recent Labs    06/19/22 1518 06/23/22 0954 06/28/22 1116 12/20/22 1004  NA 124* 127* 134 131*  K 5.5* 4.6 4.3 4.6  CL 87* 86* 93* 90*  CO2 20 22 25  25   GLUCOSE 133* 163* 298* 69*  BUN 8 10 12 10   CREATININE 1.11 1.26 1.04 0.87  CALCIUM 9.0 9.5 9.6 9.7  PROT 6.4  --   --  6.8  ALBUMIN 4.5 4.5 4.3 4.7  AST 35  --   --  34  ALT 20  --   --  20  ALKPHOS 76  --   --  62  BILITOT 0.5  --   --  0.5     No results found.  ASSESSMENT & PLAN:   Symptomatic anemia # Anemia- mild Hb 12; normocytic [since MAY 2023]-DEC 2023-  Iron studies; ferritin no evidence of iron deficiency; myeloma panel kappa lambda light chain; hepatitis screening; LDH haptoglobin; CRP-within normal limit.  Peripheral smear no evidence of schistocytes.    # B12 deficiency- 141 [March 2024-; [facility]]-currently on monthly B 12 injections-however worsening Anemia- Hb 10.  I discussed my concerns with the patient and wife regarding of ongoing malignancy involving the bone marrow versus other benign causes like liver disease.  Recommend a bone marrow biopsy.  Also recommend an ultrasound of the abdomen.  # I discussed with the patient the bone marrow biopsy and aspiration indication and procedure at length.  Given significant discomfort involved-I would recommend under anesthesia/with radiology in the hospital. I discussed the potential complications include-bleeding/trauma and risk of infection; which are fortunately very rare.  Patient is in agreement. Patient will sign the consent prior to the procedure. Bone marrow biopsy/aspiration is ordered.   # Raynaud's-clinically-recommend evaluation with rheumatology.  # Psoriasis- on TNF blocker-TALTZ [Dr.Kowalski]-stable/improved.  # DM [A1c 5.9]- on insulin  # Hx of alcohol use- [liqor]-LFTs within normal limits.  However question cirrhosis/fatty liver.  Recommend ultrasound of the abdomen.  [facility] # DISPOSITION:  # Raynauds- refer to rheumatology # US abdomen ASAP   # Bone marrow Biopsy ASAP # follow up in 3 weeks- MD; no labs- Dr.B     All questions were answered. The patient knows to call the clinic with any  problems, questions or concerns.  Earna Coder, MD 04/19/2023 12:23 PM

## 2023-04-18 NOTE — Assessment & Plan Note (Signed)
#   Anemia- mild Hb 12; normocytic [since MAY 2023]-e DEC 2023-  Iron studies; ferritin no evidence of iron deficiency; myeloma panel kappa lambda light chain; hepatitis screening; LDH haptoglobin; CRP-within normal limit.  Peripheral smear no evidence of schistocytes.  Possible B12 deficiency-see below  # B12 deficiency- 141 [March 2024-; [facility]]-prescription sent for B12 injections weekly x 3 more; and then once a month.  If not improved then consider further workup including bone marrow biopsy.  Check methylmalonic acid and autoimmune antibodies.  # Psoriasis- on TNF blocker-TALTZ [Dr.Kowalski]-stable/improved.  # DISPOSITION:    # [facility]-methylmalonic acid antiparietal antibodies; intrinsic factor antibodies- # follow up in 3 months- MD; 1 week prior-[facility] requistion cbc/bmp; B12 levels-Dr.B

## 2023-04-19 ENCOUNTER — Encounter: Payer: Self-pay | Admitting: Internal Medicine

## 2023-04-19 ENCOUNTER — Inpatient Hospital Stay: Payer: 59 | Attending: Internal Medicine | Admitting: Internal Medicine

## 2023-04-19 ENCOUNTER — Telehealth: Payer: Self-pay | Admitting: *Deleted

## 2023-04-19 VITALS — BP 150/74 | HR 115 | Temp 98.1°F | Ht 72.01 in | Wt 230.6 lb

## 2023-04-19 DIAGNOSIS — Z886 Allergy status to analgesic agent status: Secondary | ICD-10-CM | POA: Insufficient documentation

## 2023-04-19 DIAGNOSIS — Z833 Family history of diabetes mellitus: Secondary | ICD-10-CM | POA: Diagnosis not present

## 2023-04-19 DIAGNOSIS — E538 Deficiency of other specified B group vitamins: Secondary | ICD-10-CM | POA: Diagnosis present

## 2023-04-19 DIAGNOSIS — Z79899 Other long term (current) drug therapy: Secondary | ICD-10-CM | POA: Insufficient documentation

## 2023-04-19 DIAGNOSIS — R5383 Other fatigue: Secondary | ICD-10-CM | POA: Diagnosis not present

## 2023-04-19 DIAGNOSIS — Z888 Allergy status to other drugs, medicaments and biological substances status: Secondary | ICD-10-CM | POA: Insufficient documentation

## 2023-04-19 DIAGNOSIS — R2681 Unsteadiness on feet: Secondary | ICD-10-CM | POA: Diagnosis not present

## 2023-04-19 DIAGNOSIS — Z818 Family history of other mental and behavioral disorders: Secondary | ICD-10-CM | POA: Diagnosis not present

## 2023-04-19 DIAGNOSIS — D649 Anemia, unspecified: Secondary | ICD-10-CM | POA: Diagnosis present

## 2023-04-19 DIAGNOSIS — K219 Gastro-esophageal reflux disease without esophagitis: Secondary | ICD-10-CM | POA: Diagnosis not present

## 2023-04-19 DIAGNOSIS — Z881 Allergy status to other antibiotic agents status: Secondary | ICD-10-CM | POA: Insufficient documentation

## 2023-04-19 DIAGNOSIS — I10 Essential (primary) hypertension: Secondary | ICD-10-CM | POA: Diagnosis not present

## 2023-04-19 DIAGNOSIS — I7300 Raynaud's syndrome without gangrene: Secondary | ICD-10-CM

## 2023-04-19 DIAGNOSIS — L409 Psoriasis, unspecified: Secondary | ICD-10-CM | POA: Insufficient documentation

## 2023-04-19 MED ORDER — CYANOCOBALAMIN 1000 MCG/ML IJ SOLN: INTRAMUSCULAR | 1 refills | Status: DC

## 2023-04-19 NOTE — Progress Notes (Signed)
[facility] labs, scanned in.  Fell Tuesday, lt hand/wrist swollen, going to [facility] after visit with [place].  Wife states pt is extremely weak and tired x2 months. Sleeping more than usual.  Fatigue/weakness: yes Dyspena: no  Light headedness: yes Blood in stool: no  Refill vit b12, pending.  Has sores on bottom of both feet not healing, x8-9 months left foot, rt foot 2-3 months.

## 2023-04-19 NOTE — Telephone Encounter (Signed)
Confirmed appointment for Bone marrow Biopsy on 04/27/23. [facility] 7:30 [facility] NPO after midnight. Will need a driver due to moderate sedation being given. Pt will be there.

## 2023-04-23 ENCOUNTER — Ambulatory Visit
Admission: RE | Admit: 2023-04-23 | Discharge: 2023-04-23 | Disposition: A | Discharge status: Home / Self Care | Payer: 59 | Source: Ambulatory Visit | Attending: Internal Medicine | Admitting: Internal Medicine

## 2023-04-23 DIAGNOSIS — D649 Anemia, unspecified: Secondary | ICD-10-CM | POA: Diagnosis present

## 2023-04-25 ENCOUNTER — Other Ambulatory Visit: Payer: Self-pay | Admitting: Radiology

## 2023-04-25 DIAGNOSIS — Z01812 Encounter for preprocedural laboratory examination: Secondary | ICD-10-CM

## 2023-04-25 NOTE — H&P (Signed)
Chief Complaint: Patient was seen in consultation today for anemia.  Referring Physician(s): Earna Coder  Supervising Physician: Pernell Dupre  Patient Status: [facility] - Out-pt  History of Present Illness: [name] is a 67 y.o. male with a past medical history significant for GERD, gout, DM, anemia who presents today for bone marrow aspiration/biopsy. [name] was first noted to have anemia in September 2023 and has been followed regularly by hematology. He has been receiving B12 injections without improvement in anemia. Laboratory work up thus far has been unable to determine a cause for ongoing anemia and as such he has been referred to IR for bone marrow aspiration/biopsy for further evaluation.   Past Medical History:  Diagnosis Date   Adenomatous polyp    Allergy    Anemia    Arthritis    feet    Bunion 04/02/2013   STATUS POST OP BUN REPAIR   Diabetes mellitus    Foot ulcer (HCC)    GERD (gastroesophageal reflux disease)    Gout    Hammertoe    Hyperlipidemia    Hypertension    Neuromuscular disorder (HCC)    neuropathy   Plantar flexed metatarsal    Psoriasis    Schatzki's ring     Past Surgical History:  Procedure Laterality Date   CARPAL TUNNEL RELEASE Left 08/24/2022   Procedure: CARPAL TUNNEL RELEASE;  Surgeon: Deeann Saint, MD;  Location: [facility] ORS;  Service: Orthopedics;  Laterality: Left;   CATARACT EXTRACTION W/PHACO Left 03/21/2023   Procedure: CATARACT EXTRACTION PHACO AND INTRAOCULAR LENS PLACEMENT (IOC) LEFT MALYUGIN DIABETIC 9.88 1:01.4;  Surgeon: Lockie Mola, MD;  Location: [facility];  Service: Ophthalmology;  Laterality: Left;   CATARACT EXTRACTION W/PHACO Right 04/04/2023   Procedure: CATARACT EXTRACTION PHACO AND INTRAOCULAR LENS PLACEMENT (IOC) RIGHT MALYUGIN DIABETIC 15.31 01:15.1;  Surgeon: Lockie Mola, MD;  Location: [facility];  Service: Ophthalmology;  Laterality: Right;   COLONOSCOPY      FOOT AMPUTATION Right 01/11/2017   Dr Deborah Chalk ([facility])--transmetatarsal   FOOT SURGERY Right 02/28/2013   Arta Bruce HAM TOE2,3 PINS ,2ND MET OSTEOTOMY, 5TH MET W/SCREW   02-2013, 04-2013   HIP FRACTURE SURGERY  1993   surgery x2   NASAL SEPTOPLASTY W/ TURBINOPLASTY Bilateral 02/08/2023   Procedure: NASAL SEPTOPLASTY WITH TURBINATE REDUCTION;  Surgeon: Vernie Murders, MD;  Location: [facility];  Service: ENT;  Laterality: Bilateral;   POLYPECTOMY     PROSTATE BIOPSY  10/10/2019   TOTAL HIP ARTHROPLASTY     Left hip replacement/femur fx 07/04, Screw removal left hip- [facility] 11/01    Allergies: Glipizide, Lisinopril, Ramipril, Ibuprofen, and Bactrim [sulfamethoxazole-trimethoprim]  Medications: Prior to Admission medications   Medication Sig Start Date End Date Taking? Authorizing Provider  Alpha-Lipoic Acid 600 MG TABS Take by mouth. 2 in am and 1 in pm    [provider]  amLODipine (NORVASC) 5 MG tablet TAKE 1 TABLET(5 MG) BY MOUTH DAILY 05/15/22   Karie Schwalbe, MD  B-D UF III MINI PEN NEEDLES 31G X 5 MM MISC USE AS DIRECTED DAILY 01/30/23   Tillman Abide I, MD  calcipotriene (DOVONOX) 0.005 % ointment Apply topically 2 (two) times daily. 12/15/22   [provider]  CALCIUM-MAGNESIUM-ZINC PO Take by mouth.    [provider]  Cinnamon 500 MG capsule Take 500 mg by mouth 2 (two) times daily.    [provider]  clobetasol ointment (TEMOVATE) 0.05 % Apply topically. 11/30/22   [provider]  CONTOUR NEXT TEST test strip USE 1 TO 2 TIMES DAILY TO  CHECK BLOOD SUGAR 07/11/22   Tillman Abide I, MD  cyanocobalamin (VITAMIN B12) 1000 MCG/ML injection 1 ml injection-once a month. 04/19/23   Earna Coder, MD  DULoxetine (CYMBALTA) 60 MG capsule Take 1 capsule (60 mg total) by mouth daily. 09/18/22   Karie Schwalbe, MD  finasteride (PROSCAR) 5 MG tablet TAKE 1 TABLET BY MOUTH DAILY 01/30/23   Vanna Scotland, MD  furosemide (LASIX) 20 MG  tablet Take 1 tablet (20 mg total) by mouth daily as needed. 02/14/19   Karie Schwalbe, MD  hydrocortisone valerate ointment (WEST-CORT) 0.2 % APPLY TOPICALLY TO AFFECTED  AREA(S) TWICE DAILY 12/27/22   Karie Schwalbe, MD  hydrOXYzine (VISTARIL) 25 MG capsule Take 1 capsule (25 mg total) by mouth every 8 (eight) hours as needed for itching. 09/18/22   Karie Schwalbe, MD  indomethacin (INDOCIN) 25 MG capsule Take 25 mg by mouth.    [provider]  insulin glargine (LANTUS SOLOSTAR) 100 UNIT/ML Solostar Pen Inject 15 Units into the skin daily. 12/20/22   Karie Schwalbe, MD  Ixekizumab (TALTZ) 80 MG/ML SOAJ Inject 80 mg into the skin every 28 (twenty-eight) days. For maintenance. 10/11/22   Deirdre Evener, MD  losartan (COZAAR) 25 MG tablet TAKE 1 TABLET(25 MG) BY MOUTH DAILY 02/14/23   Tillman Abide I, MD  meloxicam (MOBIC) 15 MG tablet Take 15 mg by mouth daily.    [provider]  metFORMIN (GLUCOPHAGE) 1000 MG tablet TAKE 1 TABLET BY MOUTH TWICE  DAILY WITH MEALS 01/30/23   Tillman Abide I, MD  omeprazole (PRILOSEC) 20 MG capsule TAKE 1 CAPSULE BY MOUTH TWICE  DAILY BEFORE MEALS 12/27/22   Tillman Abide I, MD  simvastatin (ZOCOR) 40 MG tablet TAKE 1 TABLET BY MOUTH AT  BEDTIME 01/30/23   Tillman Abide I, MD  SYRINGE-NEEDLE, DISP, 3 ML (BD SAFETYGLIDE SYRINGE/NEEDLE) 25G X 1" 3 ML MISC Use the needle for IM injection once a  month; or as directed. 10/31/22   Earna Coder, MD  tamsulosin (FLOMAX) 0.4 MG CAPS capsule TAKE 1 CAPSULE BY MOUTH DAILY 01/30/23   Vanna Scotland, MD  triamcinolone lotion (KENALOG) 0.1 % APPLY TO AFFECTED AREA(S)  TOPICALLY 3 TIMES DAILY 01/30/23   Karie Schwalbe, MD     Family History  Problem Relation Age of Onset   Diabetes Mother    Diabetes Brother    Alzheimer's disease Maternal Aunt    Alzheimer's disease Cousin    Cancer Neg Hx    Colon cancer Neg Hx    Colon polyps Neg Hx    Rectal cancer Neg Hx    Stomach cancer Neg Hx     Esophageal cancer Neg Hx     Social History   Socioeconomic History   Marital status: Married    Spouse name: Not on file   Number of children: 3   Years of education: Not on file   Highest education level: Not on file  Occupational History   Occupation: PC ADMINISTRATOR    Employer: [employer]  Tobacco Use   Smoking status: Never    Passive exposure: Past   Smokeless tobacco: Never  Vaping Use   Vaping status: Never Used  Substance and Sexual Activity   Alcohol use: Yes    Alcohol/week: 1.0 standard drink of alcohol    Types: 1 Standard drinks or equivalent per week  Comment: occasional (DWI 1991)   Drug use: No   Sexual activity: Yes    Birth control/protection: None  Other Topics Concern   Not on file  Social History Narrative    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 With wife; and 2 dog.     Social Determinants of Health   Financial Resource Strain: Not on file  Food Insecurity: Not on file  Transportation Needs: Not on file  Physical Activity: Not on file  Stress: Not on file  Social Connections: Not on file     Review of Systems: A 12 point ROS discussed and pertinent positives are indicated in the HPI above.  All other systems are negative.  Review of Systems  Constitutional:  Negative for chills and fever.  Respiratory:  Negative for cough and shortness of breath.   Cardiovascular:  Negative for chest pain.  Gastrointestinal:  Negative for abdominal pain, diarrhea, nausea and vomiting.  Musculoskeletal:  Negative for back pain.  Neurological:  Negative for dizziness and headaches.    Vital Signs: BP (!) 157/83   Pulse 98   Temp 98.1 F (36.7 C) (Oral)   Resp 18   Ht 5\' 11"  (1.803 m)   Wt 225 lb (102.1 kg)   SpO2 95%   BMI 31.38 kg/m   Physical Exam Vitals and nursing note reviewed.  Constitutional:      General: He is not in acute distress. HENT:     Head: Normocephalic.      Mouth/Throat:     Mouth: Mucous membranes are moist.     Pharynx: Oropharynx is clear. No oropharyngeal exudate or posterior oropharyngeal erythema.  Cardiovascular:     Rate and Rhythm: Normal rate and regular rhythm.  Pulmonary:     Effort: Pulmonary effort is normal.     Breath sounds: Normal breath sounds.  Abdominal:     General: There is no distension.     Palpations: Abdomen is soft.     Tenderness: There is no abdominal tenderness.  Skin:    General: Skin is warm and dry.  Neurological:     Mental Status: He is alert and oriented to person, place, and time.  Psychiatric:        Mood and Affect: Mood normal.        Behavior: Behavior normal.        Thought Content: Thought content normal.        Judgment: Judgment normal.      MD Evaluation Airway: WNL Heart: WNL Abdomen: WNL Chest/ Lungs: WNL ASA  Classification: 3 Mallampati/Airway Score: Two   Imaging: US Abdomen Complete  Result Date: 04/23/2023 CLINICAL DATA:  Cirrhosis. EXAM: ABDOMEN ULTRASOUND COMPLETE COMPARISON:  None Available. FINDINGS: Gallbladder: No gallstones or wall thickening visualized. No sonographic Murphy sign noted by sonographer. Common bile duct: Diameter: 2.8 mm Liver: Increased echogenicity. No focal lesion. Portal vein is patent on color Doppler imaging with normal direction of blood flow towards the liver. IVC: No abnormality visualized. Pancreas: Visualized portion unremarkable. Spleen: Size and appearance within normal limits. Right Kidney: Length: 12.4 cm. Echogenicity within normal limits. No mass or hydronephrosis visualized. Left Kidney: Length: 12.0 cm. Echogenicity within normal limits. No mass or hydronephrosis visualized. Abdominal aorta: No aneurysm visualized. Other findings: None. IMPRESSION: 1. Increased hepatic parenchymal echogenicity suggestive of steatosis. 2. No cholelithiasis or sonographic evidence for acute cholecystitis. Electronically Signed   By: Annia Belt M.D.   On:  04/23/2023 09:38    Labs:  CBC: Recent Labs    06/19/22 1518 06/28/22 1116 12/20/22 1004  WBC 7.0 6.6 4.8  HGB 11.9* 12.0* 11.7*  HCT 34.4* 34.6* 33.9*  PLT 284 420 239    COAGS: No results for input(s): "INR", "APTT" in the last 8760 hours.  BMP: Recent Labs    06/19/22 1518 06/23/22 0954 06/28/22 1116 12/20/22 1004  NA 124* 127* 134 131*  K 5.5* 4.6 4.3 4.6  CL 87* 86* 93* 90*  CO2 20 22 25 25   GLUCOSE 133* 163* 298* 69*  BUN 8 10 12 10   CALCIUM 9.0 9.5 9.6 9.7  CREATININE 1.11 1.26 1.04 0.87    LIVER FUNCTION TESTS: Recent Labs    06/19/22 1518 06/23/22 0954 06/28/22 1116 12/20/22 1004  BILITOT 0.5  --   --  0.5  AST 35  --   --  34  ALT 20  --   --  20  ALKPHOS 76  --   --  62  PROT 6.4  --   --  6.8  ALBUMIN 4.5 4.5 4.3 4.7    TUMOR MARKERS: No results for input(s): "AFPTM", "CEA", "CA199", "CHROMGRNA" in the last 8760 hours.  Assessment and Plan:  67 y/o M with 1 year history of anemia of unknown etiology who presents today for bone marrow aspiration/biopsy to further direct care.  Risks and benefits of bone marrow aspiration/biopsy was discussed with the patient and/or patient's family including, but not limited to bleeding, infection, damage to adjacent structures or low yield requiring additional tests.  All of the questions were answered and there is agreement to proceed.  Consent signed and in chart.   Thank you for this interesting consult.  I greatly enjoyed meeting [name] and look forward to participating in their care.  A copy of this report was sent to the requesting provider on this date.  Electronically Signed: Villa Herb, PA-C 04/25/2023, 3:30 PM   I spent a total of 30 Minutes  in face to face in clinical consultation, greater than 50% of which was counseling/coordinating care for anemia.

## 2023-04-26 ENCOUNTER — Other Ambulatory Visit: Payer: Self-pay | Admitting: Student

## 2023-04-26 NOTE — Progress Notes (Signed)
Patient for IR Bone Marrow Biopsy on Friday 04/27/2023, I called and LVM for the patient on the phone and gave pre-procedure instructions. VM made the patient aware to be here at 7:30a, NPO after MN prior to procedure as well as driver post procedure/recovery/discharge. Pt stated understanding.  Called 04/26/23

## 2023-04-27 ENCOUNTER — Ambulatory Visit
Admission: RE | Admit: 2023-04-27 | Discharge: 2023-04-27 | Disposition: A | Discharge status: Home / Self Care | Payer: 59 | Source: Ambulatory Visit | Attending: Internal Medicine | Admitting: Internal Medicine

## 2023-04-27 ENCOUNTER — Other Ambulatory Visit: Payer: Self-pay

## 2023-04-27 ENCOUNTER — Encounter: Payer: Self-pay | Admitting: Radiology

## 2023-04-27 DIAGNOSIS — D649 Anemia, unspecified: Secondary | ICD-10-CM | POA: Insufficient documentation

## 2023-04-27 DIAGNOSIS — Z7984 Long term (current) use of oral hypoglycemic drugs: Secondary | ICD-10-CM | POA: Diagnosis not present

## 2023-04-27 DIAGNOSIS — Z794 Long term (current) use of insulin: Secondary | ICD-10-CM | POA: Insufficient documentation

## 2023-04-27 DIAGNOSIS — E1140 Type 2 diabetes mellitus with diabetic neuropathy, unspecified: Secondary | ICD-10-CM | POA: Insufficient documentation

## 2023-04-27 DIAGNOSIS — K219 Gastro-esophageal reflux disease without esophagitis: Secondary | ICD-10-CM | POA: Diagnosis not present

## 2023-04-27 DIAGNOSIS — Z01812 Encounter for preprocedural laboratory examination: Secondary | ICD-10-CM

## 2023-04-27 DIAGNOSIS — M109 Gout, unspecified: Secondary | ICD-10-CM | POA: Diagnosis not present

## 2023-04-27 HISTORY — PX: IR BONE MARROW BIOPSY & ASPIRATION: IMG5727

## 2023-04-27 LAB — CBC WITH DIFFERENTIAL/PLATELET
Abs Immature Granulocytes: 0.03 10*3/uL (ref 0.00–0.07)
Basophils Absolute: 0 10*3/uL (ref 0.0–0.1)
Basophils Relative: 1 %
Eosinophils Absolute: 0.3 10*3/uL (ref 0.0–0.5)
Eosinophils Relative: 5 %
HCT: 30.4 % — ABNORMAL LOW (ref 39.0–52.0)
Hemoglobin: 11 g/dL — ABNORMAL LOW (ref 13.0–17.0)
Immature Granulocytes: 1 %
Lymphocytes Relative: 20 %
Lymphs Abs: 1.3 10*3/uL (ref 0.7–4.0)
MCH: 34.4 pg — ABNORMAL HIGH (ref 26.0–34.0)
MCHC: 36.2 g/dL — ABNORMAL HIGH (ref 30.0–36.0)
MCV: 95 fL (ref 80.0–100.0)
Monocytes Absolute: 0.6 10*3/uL (ref 0.1–1.0)
Monocytes Relative: 9 %
Neutro Abs: 4.3 10*3/uL (ref 1.7–7.7)
Neutrophils Relative %: 64 %
Platelets: 318 10*3/uL (ref 150–400)
RBC: 3.2 MIL/uL — ABNORMAL LOW (ref 4.22–5.81)
RDW: 13.2 % (ref 11.5–15.5)
WBC: 6.6 10*3/uL (ref 4.0–10.5)
nRBC: 0 % (ref 0.0–0.2)

## 2023-04-27 LAB — GLUCOSE, CAPILLARY: Glucose-Capillary: 117 mg/dL — ABNORMAL HIGH (ref 70–99)

## 2023-04-27 MED ORDER — MIDAZOLAM HCL 2 MG/2ML IJ SOLN
INTRAMUSCULAR | Status: AC | PRN
Start: 2023-04-27 — End: 2023-04-27
  Administered 2023-04-27: 1 mg via INTRAVENOUS

## 2023-04-27 MED ORDER — MIDAZOLAM HCL 2 MG/2ML IJ SOLN
INTRAMUSCULAR | Status: AC
  Filled 2023-04-27: qty 2

## 2023-04-27 MED ORDER — FENTANYL CITRATE (PF) 100 MCG/2ML IJ SOLN
INTRAMUSCULAR | Status: AC
  Filled 2023-04-27: qty 2

## 2023-04-27 MED ORDER — LIDOCAINE 1 % OPTIME INJ - NO CHARGE
10.0000 mL | Freq: Once | INTRAMUSCULAR | Status: AC
  Administered 2023-04-27: 10 mL via INTRADERMAL
  Filled 2023-04-27: qty 10

## 2023-04-27 MED ORDER — FENTANYL CITRATE (PF) 100 MCG/2ML IJ SOLN
INTRAMUSCULAR | Status: AC | PRN
Start: 2023-04-27 — End: 2023-04-27
  Administered 2023-04-27 (×2): 50 ug via INTRAVENOUS

## 2023-04-27 MED ORDER — SODIUM CHLORIDE 0.9 % IV SOLN: INTRAVENOUS | Status: DC

## 2023-04-27 MED ORDER — HEPARIN SOD (PORK) LOCK FLUSH 100 UNIT/ML IV SOLN
INTRAVENOUS | Status: AC
  Filled 2023-04-27: qty 5

## 2023-04-27 NOTE — Progress Notes (Signed)
Patient clinically stable post IR BMB per Dr Milford Cage, tolerated well. Vitals stable pre and post procedure. Received Versed 1 mg along with Fentanyl 100 mcg IV for procedure. Report given to Marni Griffon RN post procedure/11/specials.

## 2023-04-27 NOTE — Procedures (Signed)
Vascular and Interventional Radiology Procedure Note  Patient: EBAN WEICK DOB: Mar 09, 1956 Medical Record Number: [id number] Note Date/Time: 04/27/23 8:38 AM   Performing Physician: Roanna Banning, MD Assistant(s): None  Diagnosis: Anemia   Procedure: BONE MARROW ASPIRATION and BIOPSY  Anesthesia: Conscious Sedation Complications: None Estimated Blood Loss: Minimal Specimens: Sent for Pathology  Findings:  Successful Fluoroscopy-guided bone marrow aspiration and biopsy A total of 2 cores were obtained. Hemostasis of the tract was achieved using Manual Pressure.  Plan: Bed rest for 1 hours.  See detailed procedure note with images in PACS. The patient tolerated the procedure well without incident or complication and was returned to Recovery in stable condition.    Roanna Banning, MD [facility] Radiology   Pager. [phone] Clinic. [phone]

## 2023-04-30 LAB — SURGICAL PATHOLOGY

## 2023-05-02 ENCOUNTER — Ambulatory Visit: Payer: 59 | Admitting: Internal Medicine

## 2023-05-03 ENCOUNTER — Other Ambulatory Visit
Admission: RE | Admit: 2023-05-03 | Discharge: 2023-05-03 | Disposition: A | Discharge status: Home / Self Care | Payer: 59 | Source: Ambulatory Visit | Attending: Podiatry | Admitting: Podiatry

## 2023-05-03 DIAGNOSIS — E1140 Type 2 diabetes mellitus with diabetic neuropathy, unspecified: Secondary | ICD-10-CM | POA: Insufficient documentation

## 2023-05-03 DIAGNOSIS — M009 Pyogenic arthritis, unspecified: Secondary | ICD-10-CM | POA: Diagnosis not present

## 2023-05-03 DIAGNOSIS — Z794 Long term (current) use of insulin: Secondary | ICD-10-CM | POA: Insufficient documentation

## 2023-05-03 DIAGNOSIS — L03116 Cellulitis of left lower limb: Secondary | ICD-10-CM | POA: Insufficient documentation

## 2023-05-03 DIAGNOSIS — Z89431 Acquired absence of right foot: Secondary | ICD-10-CM | POA: Insufficient documentation

## 2023-05-03 DIAGNOSIS — L97522 Non-pressure chronic ulcer of other part of left foot with fat layer exposed: Secondary | ICD-10-CM | POA: Insufficient documentation

## 2023-05-03 DIAGNOSIS — E1169 Type 2 diabetes mellitus with other specified complication: Secondary | ICD-10-CM | POA: Diagnosis not present

## 2023-05-04 ENCOUNTER — Inpatient Hospital Stay: Payer: 59 | Attending: Internal Medicine | Admitting: Internal Medicine

## 2023-05-04 DIAGNOSIS — Z881 Allergy status to other antibiotic agents status: Secondary | ICD-10-CM | POA: Insufficient documentation

## 2023-05-04 DIAGNOSIS — Z7984 Long term (current) use of oral hypoglycemic drugs: Secondary | ICD-10-CM | POA: Insufficient documentation

## 2023-05-04 DIAGNOSIS — E11621 Type 2 diabetes mellitus with foot ulcer: Secondary | ICD-10-CM | POA: Diagnosis not present

## 2023-05-04 DIAGNOSIS — R5383 Other fatigue: Secondary | ICD-10-CM | POA: Diagnosis not present

## 2023-05-04 DIAGNOSIS — E538 Deficiency of other specified B group vitamins: Secondary | ICD-10-CM | POA: Diagnosis present

## 2023-05-04 DIAGNOSIS — R630 Anorexia: Secondary | ICD-10-CM | POA: Diagnosis not present

## 2023-05-04 DIAGNOSIS — L97519 Non-pressure chronic ulcer of other part of right foot with unspecified severity: Secondary | ICD-10-CM | POA: Insufficient documentation

## 2023-05-04 DIAGNOSIS — Z818 Family history of other mental and behavioral disorders: Secondary | ICD-10-CM | POA: Diagnosis not present

## 2023-05-04 DIAGNOSIS — I10 Essential (primary) hypertension: Secondary | ICD-10-CM | POA: Insufficient documentation

## 2023-05-04 DIAGNOSIS — Z860101 Personal history of adenomatous and serrated colon polyps: Secondary | ICD-10-CM | POA: Insufficient documentation

## 2023-05-04 DIAGNOSIS — Z79899 Other long term (current) drug therapy: Secondary | ICD-10-CM | POA: Insufficient documentation

## 2023-05-04 DIAGNOSIS — Z833 Family history of diabetes mellitus: Secondary | ICD-10-CM | POA: Diagnosis not present

## 2023-05-04 DIAGNOSIS — D649 Anemia, unspecified: Secondary | ICD-10-CM | POA: Insufficient documentation

## 2023-05-04 DIAGNOSIS — R110 Nausea: Secondary | ICD-10-CM | POA: Diagnosis not present

## 2023-05-04 DIAGNOSIS — Z888 Allergy status to other drugs, medicaments and biological substances status: Secondary | ICD-10-CM | POA: Diagnosis not present

## 2023-05-04 DIAGNOSIS — Z886 Allergy status to analgesic agent status: Secondary | ICD-10-CM | POA: Insufficient documentation

## 2023-05-04 DIAGNOSIS — K7460 Unspecified cirrhosis of liver: Secondary | ICD-10-CM | POA: Diagnosis not present

## 2023-05-04 DIAGNOSIS — K219 Gastro-esophageal reflux disease without esophagitis: Secondary | ICD-10-CM | POA: Insufficient documentation

## 2023-05-04 NOTE — Progress Notes (Signed)
[facility] CONSULT NOTE  Patient Care Team: Karie Schwalbe, MD as PCP - General Donneta Romberg Worthy Flank, MD as Consulting Physician (Oncology)  CHIEF COMPLAINTS/PURPOSE OF CONSULTATION: ANEMIA   HEMATOLOGY HISTORY  # SEP 2023- ANEMIA MCV-platelets WBC-WNL; Iron sat; ferritin;  GFR- CT/US; EGD- in JAn 2024 /colonoscopy-dec 21st, 2023; prior 3-4 year [Dr.[facility]Baur [facility]- [facility]]- DEC 2023-  Iron studies; ferritin no evidence of iron deficiency; myeloma panel kappa lambda light chain; hepatitis screening; LDH haptoglobin; CRP-within normal limit.  Peripheral smear no evidence of schistocytes. APRIl 2024- B12 141. Started B12 injections; OCThe bone marrow is generally normocellular for age with trilineage  hematopoiesis.  There are mild nonspecific changes present, likely  secondary in nature.  Correlation with cytogenetic studies is  recommended.  #    Latest Reference Range & Units Most Recent 12/14/21 10:08 03/29/22 13:12 06/19/22 15:18 06/28/22 11:16  Hemoglobin 13.0 - 17.7 g/dL 16.1 (L) 03/30/03 54:09 13.4 12.2 (L) 11.9 (L) 12.0 (L)  (L): Data is abnormally low  Latest Reference Range & Units 06/28/22 11:16  Iron 38 - 169 ug/dL 98  UIBC 811 - 914 ug/dL 782  TIBC 956 - 213 ug/dL 086  Ferritin 30 - 578 ng/mL 143  Iron Saturation 15 - 55 % 31    # Psoraiss [Dx- 18 years]- 2023- started TNF   HISTORY OF PRESENTING ILLNESS: Accompanied by his wife ambulating independently.   [name] 67 y.o.  male pleasant patient with mild anemia without any obvious cause- ? B12 deficiency- on B12 injection is here for follow-up-] results of the bone marrow biopsy.  Patient feels poorly.  Complains of nausea poor appetite.   In the interim noted to have left foot ulceration-from diabetes.  S/p evaluation with podiatry.  Currently awaiting evaluation in the hospital for IV antibiotics MRI and vascular surgery evaluation.   Review of Systems  Constitutional:  Positive for  malaise/fatigue. Negative for chills, diaphoresis, fever and weight loss.  HENT:  Negative for nosebleeds and sore throat.   Eyes:  Negative for double vision.  Respiratory:  Negative for cough, hemoptysis, sputum production, shortness of breath and wheezing.   Cardiovascular:  Negative for chest pain, palpitations, orthopnea and leg swelling.  Gastrointestinal:  Negative for abdominal pain, blood in stool, constipation, diarrhea, heartburn, melena, nausea and vomiting.  Genitourinary:  Positive for frequency. Negative for dysuria and urgency.  Musculoskeletal:  Negative for back pain and joint pain.  Skin: Negative.  Negative for itching and rash.  Neurological:  Negative for dizziness, tingling, focal weakness, weakness and headaches.  Endo/Heme/Allergies:  Does not bruise/bleed easily.  Psychiatric/Behavioral:  Negative for depression. The patient is not nervous/anxious and does not have insomnia.      MEDICAL HISTORY:  Past Medical History:  Diagnosis Date   Adenomatous polyp    Allergy    Anemia    Arthritis    feet    Bunion 04/02/2013   STATUS POST OP BUN REPAIR   Diabetes mellitus    Foot ulcer (HCC)    GERD (gastroesophageal reflux disease)    Gout    Hammertoe    Hyperlipidemia    Hypertension    Neuromuscular disorder (HCC)    neuropathy   Plantar flexed metatarsal    Psoriasis    Schatzki's ring     SURGICAL HISTORY: Past Surgical History:  Procedure Laterality Date   CARPAL TUNNEL RELEASE Left 08/24/2022   Procedure: CARPAL TUNNEL RELEASE;  Surgeon: Deeann Saint, MD;  Location:  [facility] ORS;  Service: Orthopedics;  Laterality: Left;   CATARACT EXTRACTION W/PHACO Left 03/21/2023   Procedure: CATARACT EXTRACTION PHACO AND INTRAOCULAR LENS PLACEMENT (IOC) LEFT MALYUGIN DIABETIC 9.88 1:01.4;  Surgeon: Lockie Mola, MD;  Location: [facility];  Service: Ophthalmology;  Laterality: Left;   CATARACT EXTRACTION W/PHACO Right 04/04/2023   Procedure:  CATARACT EXTRACTION PHACO AND INTRAOCULAR LENS PLACEMENT (IOC) RIGHT MALYUGIN DIABETIC 15.31 01:15.1;  Surgeon: Lockie Mola, MD;  Location: [facility];  Service: Ophthalmology;  Laterality: Right;   COLONOSCOPY     FOOT AMPUTATION Right 01/11/2017   Dr Deborah Chalk ([facility])--transmetatarsal   FOOT SURGERY Right 02/28/2013   Arta Bruce HAM TOE2,3 PINS ,2ND MET OSTEOTOMY, 5TH MET W/SCREW   02-2013, 04-2013   HIP FRACTURE SURGERY  1993   surgery x2   IR BONE MARROW BIOPSY & ASPIRATION  04/27/2023   NASAL SEPTOPLASTY W/ TURBINOPLASTY Bilateral 02/08/2023   Procedure: NASAL SEPTOPLASTY WITH TURBINATE REDUCTION;  Surgeon: Vernie Murders, MD;  Location: [facility];  Service: ENT;  Laterality: Bilateral;   POLYPECTOMY     PROSTATE BIOPSY  10/10/2019   TOTAL HIP ARTHROPLASTY     Left hip replacement/femur fx 07/04, Screw removal left hip- [facility] 11/01    SOCIAL HISTORY: Social History   Socioeconomic History   Marital status: Married    Spouse name: Not on file   Number of children: 3   Years of education: Not on file   Highest education level: Not on file  Occupational History   Occupation: PC ADMINISTRATOR    Employer: [employer]  Tobacco Use   Smoking status: Never    Passive exposure: Past   Smokeless tobacco: Never  Vaping Use   Vaping status: Never Used  Substance and Sexual Activity   Alcohol use: Yes    Alcohol/week: 1.0 standard drink of alcohol    Types: 1 Standard drinks or equivalent per week    Comment: occasional (DWI 1991)   Drug use: No   Sexual activity: Yes    Birth control/protection: None  Other Topics Concern   Not on file  Social History Narrative    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 With wife; and 2 dog.     Social Determinants of Health   Financial Resource Strain: Not on file  Food Insecurity: Not on file  Transportation Needs: Not on file  Physical Activity: Not on file  Stress: Not  on file  Social Connections: Not on file  Intimate Partner Violence: Not on file    FAMILY HISTORY: Family History  Problem Relation Age of Onset   Diabetes Mother    Diabetes Brother    Alzheimer's disease Maternal Aunt    Alzheimer's disease Cousin    Cancer Neg Hx    Colon cancer Neg Hx    Colon polyps Neg Hx    Rectal cancer Neg Hx    Stomach cancer Neg Hx    Esophageal cancer Neg Hx     ALLERGIES:  is allergic to glipizide, lisinopril, ramipril, ibuprofen, and bactrim [sulfamethoxazole-trimethoprim].  MEDICATIONS:  Current Outpatient Medications  Medication Sig Dispense Refill   Alpha-Lipoic Acid 600 MG TABS Take by mouth. 2 in am and 1 in pm     amLODipine (NORVASC) 5 MG tablet TAKE 1 TABLET(5 MG) BY MOUTH DAILY 90 tablet 3   B-D UF III MINI PEN NEEDLES 31G X 5 MM MISC USE AS DIRECTED DAILY 90 each 3   calcipotriene (  DOVONOX) 0.005 % ointment Apply topically 2 (two) times daily.     CALCIUM-MAGNESIUM-ZINC PO Take by mouth.     Cinnamon 500 MG capsule Take 500 mg by mouth 2 (two) times daily.     clobetasol ointment (TEMOVATE) 0.05 % Apply topically.     CONTOUR NEXT TEST test strip USE 1 TO 2 TIMES DAILY TO  CHECK BLOOD SUGAR 200 strip 3   cyanocobalamin (VITAMIN B12) 1000 MCG/ML injection 1 ml injection-once a month. 12 mL 1   DULoxetine (CYMBALTA) 60 MG capsule Take 1 capsule (60 mg total) by mouth daily. 90 capsule 3   finasteride (PROSCAR) 5 MG tablet TAKE 1 TABLET BY MOUTH DAILY 90 tablet 3   furosemide (LASIX) 20 MG tablet Take 1 tablet (20 mg total) by mouth daily as needed. 90 tablet 3   hydrocortisone valerate ointment (WEST-CORT) 0.2 % APPLY TOPICALLY TO AFFECTED  AREA(S) TWICE DAILY 120 g 1   hydrOXYzine (VISTARIL) 25 MG capsule Take 1 capsule (25 mg total) by mouth every 8 (eight) hours as needed for itching. 60 capsule 0   indomethacin (INDOCIN) 25 MG capsule Take 25 mg by mouth.     insulin glargine (LANTUS SOLOSTAR) 100 UNIT/ML Solostar Pen Inject 15 Units  into the skin daily. 1 mL 0   Ixekizumab (TALTZ) 80 MG/ML SOAJ Inject 80 mg into the skin every 28 (twenty-eight) days. For maintenance. 1 mL 4   losartan (COZAAR) 25 MG tablet TAKE 1 TABLET(25 MG) BY MOUTH DAILY 90 tablet 3   meloxicam (MOBIC) 15 MG tablet Take 15 mg by mouth daily.     metFORMIN (GLUCOPHAGE) 1000 MG tablet TAKE 1 TABLET BY MOUTH TWICE  DAILY WITH MEALS 180 tablet 3   omeprazole (PRILOSEC) 20 MG capsule TAKE 1 CAPSULE BY MOUTH TWICE  DAILY BEFORE MEALS 180 capsule 3   simvastatin (ZOCOR) 40 MG tablet TAKE 1 TABLET BY MOUTH AT  BEDTIME 90 tablet 3   SYRINGE-NEEDLE, DISP, 3 ML (BD SAFETYGLIDE SYRINGE/NEEDLE) 25G X 1" 3 ML MISC Use the needle for IM injection once a  month; or as directed. 30 each 1   tamsulosin (FLOMAX) 0.4 MG CAPS capsule TAKE 1 CAPSULE BY MOUTH DAILY 90 capsule 3   triamcinolone lotion (KENALOG) 0.1 % APPLY TO AFFECTED AREA(S)  TOPICALLY 3 TIMES DAILY 180 mL 1   No current facility-administered medications for this visit.     Marland Kitchen  PHYSICAL EXAMINATION:   Vitals:   05/04/23 1452  BP: (!) 152/77  Pulse: (!) 107  Resp: 16  Temp: 98.5 F (36.9 C)  SpO2: 100%     Filed Weights   05/04/23 1452  Weight: 226 lb (102.5 kg)      Physical Exam Vitals and nursing note reviewed.  HENT:     Head: Normocephalic and atraumatic.     Mouth/Throat:     Pharynx: Oropharynx is clear.  Eyes:     Extraocular Movements: Extraocular movements intact.     Pupils: Pupils are equal, round, and reactive to light.  Cardiovascular:     Rate and Rhythm: Normal rate and regular rhythm.  Abdominal:     Palpations: Abdomen is soft.  Musculoskeletal:        General: Normal range of motion.     Cervical back: Normal range of motion.  Skin:    General: Skin is warm.  Neurological:     General: No focal deficit present.     Mental Status: He is alert and oriented to  person, place, and time.  Psychiatric:        Behavior: Behavior normal.        Judgment: Judgment  normal.      LABORATORY DATA:  I have reviewed the data as listed Lab Results  Component Value Date   WBC 6.6 04/27/2023   HGB 11.0 (L) 04/27/2023   HCT 30.4 (L) 04/27/2023   MCV 95.0 04/27/2023   PLT 318 04/27/2023   Recent Labs    06/19/22 1518 06/23/22 0954 06/28/22 1116 12/20/22 1004  NA 124* 127* 134 131*  K 5.5* 4.6 4.3 4.6  CL 87* 86* 93* 90*  CO2 20 22 25 25   GLUCOSE 133* 163* 298* 69*  BUN 8 10 12 10   CREATININE 1.11 1.26 1.04 0.87  CALCIUM 9.0 9.5 9.6 9.7  PROT 6.4  --   --  6.8  ALBUMIN 4.5 4.5 4.3 4.7  AST 35  --   --  34  ALT 20  --   --  20  ALKPHOS 76  --   --  62  BILITOT 0.5  --   --  0.5     IR BONE MARROW BIOPSY & ASPIRATION  Result Date: 04/27/2023 INDICATION: Worsenining anemia EXAM: FLUOROSCOPIC GUIDED BONE MARROW BIOPSY AND ASPIRATION MEDICATIONS: None FLUOROSCOPY TIME:  Fluoroscopic dose; 7 mGy ANESTHESIA/SEDATION: Moderate (conscious) sedation was employed during this procedure. A total of Versed 1 mg and Fentanyl 100 mcg was administered intravenously. Moderate Sedation Time: 12 minutes. The patient's level of consciousness and vital signs were monitored continuously by radiology nursing throughout the procedure under my direct supervision. COMPLICATIONS: None immediate. PROCEDURE: Informed consent was obtained from the patient and/or patient's representative following an explanation of the procedure, risks, benefits and alternatives. The patient understands, agrees and consents for the procedure. All questions were addressed. A time out was performed prior to the initiation of the procedure. The patient was positioned prone on the fluoroscopy table and the posterior aspect of the RIGHT iliac crest was marked fluoroscopically. The operative site was prepped and draped in the usual sterile fashion. Under sterile conditions and local anesthesia, an 11 gauge coaxial bone biopsy needle was advanced into the posterior aspect of the RIGHT iliac marrow space  under intermittent fluoroscopic guidance. Multiple fluoroscopic images were saved procedural documentation purposes. Initially, a bone marrow aspiration was performed. Next, a bone marrow biopsy was obtained with the 11 gauge outer bone marrow device. The 11 gauge coaxial bone biopsy needle was re-advanced into a slightly different location within the left iliac marrow space, positioning was confirmed with fluoroscopic imaging and an additional bone marrow biopsy was obtained. The needle was removed and superficial hemostasis was obtained with manual compression. A dressing was applied. The patient tolerated the procedure well without immediate post procedural complication. IMPRESSION: Successful fluoroscopic guided RIGHT iliac bone marrow aspiration and core biopsy. Roanna Banning, MD [facility] Electronically Signed   By: Roanna Banning M.D.   On: 04/27/2023 09:27   US Abdomen Complete  Result Date: 04/23/2023 CLINICAL DATA:  Cirrhosis. EXAM: ABDOMEN ULTRASOUND COMPLETE COMPARISON:  None Available. FINDINGS: Gallbladder: No gallstones or wall thickening visualized. No sonographic Murphy sign noted by sonographer. Common bile duct: Diameter: 2.8 mm Liver: Increased echogenicity. No focal lesion. Portal vein is patent on color Doppler imaging with normal direction of blood flow towards the liver. IVC: No abnormality visualized. Pancreas: Visualized portion unremarkable. Spleen: Size and appearance within normal limits. Right Kidney: Length: 12.4 cm.  Echogenicity within normal limits. No mass or hydronephrosis visualized. Left Kidney: Length: 12.0 cm. Echogenicity within normal limits. No mass or hydronephrosis visualized. Abdominal aorta: No aneurysm visualized. Other findings: None. IMPRESSION: 1. Increased hepatic parenchymal echogenicity suggestive of steatosis. 2. No cholelithiasis or sonographic evidence for acute cholecystitis. Electronically Signed    By: Annia Belt M.D.   On: 04/23/2023 09:38    ASSESSMENT & PLAN:   Symptomatic anemia # Anemia- mild Hb 12; normocytic [since MAY 2023]-DEC 2023-  Iron studies; ferritin no evidence of iron deficiency; myeloma panel kappa lambda light chain; hepatitis screening; LDH haptoglobin; CRP-within normal limit.  Peripheral smear no evidence of schistocytes.  OCT 2024- Bone marrow Biopsy: Variably cellular bone marrow with trilineage hematopoiesis; The bone marrow is generally normocellular for age with trilineage  hematopoiesis.  There are mild nonspecific changes present, likely  secondary in nature.  Correlation with cytogenetic studies is  recommended. [place] 2024- OCT 2024-  Increased hepatic parenchymal echogenicity suggestive of steatosis.  No cholelithiasis or sonographic evidence for acute cholecystitis.  # Anemia currently 11 likely secondary to inflammation/infection see discussion regarding nonhealing wounds of the bilateral lower extremities left more than right.  DM- Bil Foot ulcer- [Dr.Kline, podiatry]; awaiting in patient for IV antibiotics/ in patient admission tomorrow.   # B12 deficiency- 141 [March 2024-; [facility]]-s/p  monthly B 12 injections- ; MAY 2024-B 12 1500 ; recommend B12 every other day. ]   # Psoriasis- on TNF blocker-TALTZ [Dr.Kowalski]-stable/improved.  # Diabetic DM [A1c 5.9]- on insulin- see above.   [facility] # DISPOSITION: # follow up in 6  months- MD; labs- cbc/cmp; b12 levels; CRP- Dr.B      All questions were answered. The patient knows to call the clinic with any problems, questions or concerns.  Earna Coder, MD 05/04/2023 4:02 PM

## 2023-05-04 NOTE — Assessment & Plan Note (Addendum)
#   Anemia- mild Hb 12; normocytic [since MAY 2023]-DEC 2023-  Iron studies; ferritin no evidence of iron deficiency; myeloma panel kappa lambda light chain; hepatitis screening; LDH haptoglobin; CRP-within normal limit.  Peripheral smear no evidence of schistocytes.  OCT 2024- Bone marrow Biopsy: Variably cellular bone marrow with trilineage hematopoiesis; The bone marrow is generally normocellular for age with trilineage  hematopoiesis.  There are mild nonspecific changes present, likely  secondary in nature.  Correlation with cytogenetic studies is  recommended. [place] 2024- OCT 2024-  Increased hepatic parenchymal echogenicity suggestive of steatosis.  No cholelithiasis or sonographic evidence for acute cholecystitis.  # Anemia currently 11 likely secondary to inflammation/infection see discussion regarding nonhealing wounds of the bilateral lower extremities left more than right.  DM- Bil Foot ulcer- [Dr.Kline, podiatry]; awaiting in patient for IV antibiotics/ in patient admission tomorrow.   # B12 deficiency- 141 [March 2024-; [facility]]-s/p  monthly B 12 injections- ; MAY 2024-B 12 1500 ; recommend B12 every other day. ]   # Psoriasis- on TNF blocker-TALTZ [Dr.Kowalski]-stable/improved.  # Diabetic DM [A1c 5.9]- on insulin- see above.   [facility] # DISPOSITION: # follow up in 6  months- MD; labs- cbc/cmp; b12 levels; CRP- Dr.B

## 2023-05-04 NOTE — Progress Notes (Signed)
Has an infection in his left foot that he states he is going in the hospital tomorrow to get IV antibiotics tomorrow as well as an MRI.

## 2023-05-05 ENCOUNTER — Inpatient Hospital Stay: Payer: 59

## 2023-05-05 ENCOUNTER — Other Ambulatory Visit: Payer: Self-pay

## 2023-05-05 ENCOUNTER — Inpatient Hospital Stay
Admission: EM | Admit: 2023-05-05 | Discharge: 2023-05-10 | DRG: 617 | Disposition: A | Discharge status: Home / Self Care | Payer: 59 | Attending: Internal Medicine | Admitting: Internal Medicine

## 2023-05-05 DIAGNOSIS — K219 Gastro-esophageal reflux disease without esophagitis: Secondary | ICD-10-CM | POA: Diagnosis present

## 2023-05-05 DIAGNOSIS — I739 Peripheral vascular disease, unspecified: Secondary | ICD-10-CM | POA: Diagnosis not present

## 2023-05-05 DIAGNOSIS — I10 Essential (primary) hypertension: Secondary | ICD-10-CM | POA: Diagnosis present

## 2023-05-05 DIAGNOSIS — E871 Hypo-osmolality and hyponatremia: Secondary | ICD-10-CM | POA: Diagnosis present

## 2023-05-05 DIAGNOSIS — E1151 Type 2 diabetes mellitus with diabetic peripheral angiopathy without gangrene: Secondary | ICD-10-CM | POA: Diagnosis present

## 2023-05-05 DIAGNOSIS — Z794 Long term (current) use of insulin: Secondary | ICD-10-CM

## 2023-05-05 DIAGNOSIS — M009 Pyogenic arthritis, unspecified: Secondary | ICD-10-CM | POA: Diagnosis present

## 2023-05-05 DIAGNOSIS — Z79899 Other long term (current) drug therapy: Secondary | ICD-10-CM

## 2023-05-05 DIAGNOSIS — L97529 Non-pressure chronic ulcer of other part of left foot with unspecified severity: Secondary | ICD-10-CM | POA: Diagnosis present

## 2023-05-05 DIAGNOSIS — M109 Gout, unspecified: Secondary | ICD-10-CM

## 2023-05-05 DIAGNOSIS — E11628 Type 2 diabetes mellitus with other skin complications: Secondary | ICD-10-CM | POA: Diagnosis present

## 2023-05-05 DIAGNOSIS — E1165 Type 2 diabetes mellitus with hyperglycemia: Secondary | ICD-10-CM | POA: Diagnosis not present

## 2023-05-05 DIAGNOSIS — Z886 Allergy status to analgesic agent status: Secondary | ICD-10-CM

## 2023-05-05 DIAGNOSIS — M19072 Primary osteoarthritis, left ankle and foot: Secondary | ICD-10-CM | POA: Diagnosis present

## 2023-05-05 DIAGNOSIS — L089 Local infection of the skin and subcutaneous tissue, unspecified: Secondary | ICD-10-CM | POA: Diagnosis present

## 2023-05-05 DIAGNOSIS — Z96642 Presence of left artificial hip joint: Secondary | ICD-10-CM | POA: Diagnosis present

## 2023-05-05 DIAGNOSIS — Z7984 Long term (current) use of oral hypoglycemic drugs: Secondary | ICD-10-CM

## 2023-05-05 DIAGNOSIS — D649 Anemia, unspecified: Secondary | ICD-10-CM | POA: Diagnosis present

## 2023-05-05 DIAGNOSIS — E785 Hyperlipidemia, unspecified: Secondary | ICD-10-CM | POA: Diagnosis present

## 2023-05-05 DIAGNOSIS — E08621 Diabetes mellitus due to underlying condition with foot ulcer: Principal | ICD-10-CM

## 2023-05-05 DIAGNOSIS — E876 Hypokalemia: Secondary | ICD-10-CM | POA: Diagnosis present

## 2023-05-05 DIAGNOSIS — Z833 Family history of diabetes mellitus: Secondary | ICD-10-CM

## 2023-05-05 DIAGNOSIS — E1140 Type 2 diabetes mellitus with diabetic neuropathy, unspecified: Secondary | ICD-10-CM | POA: Diagnosis present

## 2023-05-05 DIAGNOSIS — Z820 Family history of epilepsy and other diseases of the nervous system: Secondary | ICD-10-CM

## 2023-05-05 DIAGNOSIS — Z791 Long term (current) use of non-steroidal anti-inflammatories (NSAID): Secondary | ICD-10-CM

## 2023-05-05 DIAGNOSIS — M869 Osteomyelitis, unspecified: Secondary | ICD-10-CM | POA: Diagnosis present

## 2023-05-05 DIAGNOSIS — Z789 Other specified health status: Secondary | ICD-10-CM | POA: Diagnosis not present

## 2023-05-05 DIAGNOSIS — E11621 Type 2 diabetes mellitus with foot ulcer: Secondary | ICD-10-CM | POA: Diagnosis present

## 2023-05-05 DIAGNOSIS — F1010 Alcohol abuse, uncomplicated: Secondary | ICD-10-CM | POA: Diagnosis present

## 2023-05-05 DIAGNOSIS — D638 Anemia in other chronic diseases classified elsewhere: Secondary | ICD-10-CM | POA: Diagnosis present

## 2023-05-05 DIAGNOSIS — M00872 Arthritis due to other bacteria, left ankle and foot: Secondary | ICD-10-CM | POA: Diagnosis present

## 2023-05-05 DIAGNOSIS — I70245 Atherosclerosis of native arteries of left leg with ulceration of other part of foot: Secondary | ICD-10-CM | POA: Diagnosis not present

## 2023-05-05 DIAGNOSIS — Z882 Allergy status to sulfonamides status: Secondary | ICD-10-CM

## 2023-05-05 DIAGNOSIS — D849 Immunodeficiency, unspecified: Secondary | ICD-10-CM | POA: Diagnosis present

## 2023-05-05 DIAGNOSIS — M86172 Other acute osteomyelitis, left ankle and foot: Secondary | ICD-10-CM | POA: Diagnosis not present

## 2023-05-05 DIAGNOSIS — E1169 Type 2 diabetes mellitus with other specified complication: Secondary | ICD-10-CM | POA: Diagnosis present

## 2023-05-05 DIAGNOSIS — L409 Psoriasis, unspecified: Secondary | ICD-10-CM | POA: Diagnosis present

## 2023-05-05 DIAGNOSIS — E782 Mixed hyperlipidemia: Secondary | ICD-10-CM | POA: Diagnosis not present

## 2023-05-05 DIAGNOSIS — F1020 Alcohol dependence, uncomplicated: Secondary | ICD-10-CM

## 2023-05-05 DIAGNOSIS — Z888 Allergy status to other drugs, medicaments and biological substances status: Secondary | ICD-10-CM

## 2023-05-05 DIAGNOSIS — E1149 Type 2 diabetes mellitus with other diabetic neurological complication: Secondary | ICD-10-CM | POA: Diagnosis present

## 2023-05-05 LAB — LACTIC ACID, PLASMA: Lactic Acid, Venous: 1.6 mmol/L (ref 0.5–1.9)

## 2023-05-05 LAB — COMPREHENSIVE METABOLIC PANEL
ALT: 11 U/L (ref 0–44)
AST: 20 U/L (ref 15–41)
Albumin: 3.8 g/dL (ref 3.5–5.0)
Alkaline Phosphatase: 86 U/L (ref 38–126)
Anion gap: 15 (ref 5–15)
BUN: 10 mg/dL (ref 8–23)
CO2: 29 mmol/L (ref 22–32)
Calcium: 9 mg/dL (ref 8.9–10.3)
Chloride: 83 mmol/L — ABNORMAL LOW (ref 98–111)
Creatinine, Ser: 0.92 mg/dL (ref 0.61–1.24)
GFR, Estimated: >60 mL/min (ref >60)
Glucose, Bld: 256 mg/dL — ABNORMAL HIGH (ref 70–99)
Potassium: 3.3 mmol/L — ABNORMAL LOW (ref 3.5–5.1)
Sodium: 127 mmol/L — ABNORMAL LOW (ref 135–145)
Total Bilirubin: 1.1 mg/dL (ref 0.3–1.2)
Total Protein: 7.3 g/dL (ref 6.5–8.1)

## 2023-05-05 LAB — CBC WITH DIFFERENTIAL/PLATELET
Abs Immature Granulocytes: 0.01 10*3/uL (ref 0.00–0.07)
Basophils Absolute: 0 10*3/uL (ref 0.0–0.1)
Basophils Relative: 1 %
Eosinophils Absolute: 0.2 10*3/uL (ref 0.0–0.5)
Eosinophils Relative: 3 %
HCT: 31.8 % — ABNORMAL LOW (ref 39.0–52.0)
Hemoglobin: 10.8 g/dL — ABNORMAL LOW (ref 13.0–17.0)
Immature Granulocytes: 0 %
Lymphocytes Relative: 22 %
Lymphs Abs: 1.2 10*3/uL (ref 0.7–4.0)
MCH: 33.1 pg (ref 26.0–34.0)
MCHC: 34 g/dL (ref 30.0–36.0)
MCV: 97.5 fL (ref 80.0–100.0)
Monocytes Absolute: 0.4 10*3/uL (ref 0.1–1.0)
Monocytes Relative: 8 %
Neutro Abs: 3.5 10*3/uL (ref 1.7–7.7)
Neutrophils Relative %: 66 %
Platelets: 352 10*3/uL (ref 150–400)
RBC: 3.26 MIL/uL — ABNORMAL LOW (ref 4.22–5.81)
RDW: 13.2 % (ref 11.5–15.5)
WBC: 5.2 10*3/uL (ref 4.0–10.5)
nRBC: 0 % (ref 0.0–0.2)

## 2023-05-05 LAB — URINALYSIS, W/ REFLEX TO CULTURE (INFECTION SUSPECTED)
Bacteria, UA: NONE SEEN
Bilirubin Urine: NEGATIVE
Glucose, UA: NEGATIVE mg/dL
Hgb urine dipstick: NEGATIVE
Ketones, ur: NEGATIVE mg/dL
Leukocytes,Ua: NEGATIVE
Nitrite: NEGATIVE
Protein, ur: NEGATIVE mg/dL
Specific Gravity, Urine: 1.006 (ref 1.005–1.030)
pH: 7 (ref 5.0–8.0)

## 2023-05-05 LAB — SEDIMENTATION RATE: Sed Rate: 60 mm/h — ABNORMAL HIGH (ref 0–20)

## 2023-05-05 LAB — CBG MONITORING, ED: Glucose-Capillary: 173 mg/dL — ABNORMAL HIGH (ref 70–99)

## 2023-05-05 MED ORDER — VANCOMYCIN HCL 10 G IV SOLR
20.0000 mg/kg | Freq: Once | INTRAVENOUS | Status: DC
Start: 2023-05-05 — End: 2023-05-05

## 2023-05-05 MED ORDER — INSULIN ASPART 100 UNIT/ML IJ SOLN
0.0000 [IU] | Freq: Three times a day (TID) | INTRAMUSCULAR | Status: DC
  Administered 2023-05-06: 2 [IU] via SUBCUTANEOUS
  Administered 2023-05-06 (×2): 3 [IU] via SUBCUTANEOUS
  Administered 2023-05-07: 8 [IU] via SUBCUTANEOUS
  Administered 2023-05-08: 3 [IU] via SUBCUTANEOUS
  Administered 2023-05-08: 8 [IU] via SUBCUTANEOUS
  Administered 2023-05-09: 11 [IU] via SUBCUTANEOUS
  Administered 2023-05-09 (×2): 5 [IU] via SUBCUTANEOUS
  Administered 2023-05-10: 3 [IU] via SUBCUTANEOUS
  Filled 2023-05-05 (×10): qty 1

## 2023-05-05 MED ORDER — METRONIDAZOLE 500 MG/100ML IV SOLN
500.0000 mg | Freq: Three times a day (TID) | INTRAVENOUS | Status: DC
  Administered 2023-05-05 – 2023-05-10 (×14): 500 mg via INTRAVENOUS
  Filled 2023-05-05 (×17): qty 100

## 2023-05-05 MED ORDER — FOLIC ACID 1 MG PO TABS
1.0000 mg | ORAL_TABLET | Freq: Every day | ORAL | Status: DC
  Administered 2023-05-05 – 2023-05-10 (×6): 1 mg via ORAL
  Filled 2023-05-05 (×6): qty 1

## 2023-05-05 MED ORDER — THIAMINE HCL 100 MG/ML IJ SOLN
100.0000 mg | Freq: Every day | INTRAMUSCULAR | Status: DC
  Filled 2023-05-05: qty 2

## 2023-05-05 MED ORDER — LORAZEPAM 1 MG PO TABS: 1.0000 mg | ORAL_TABLET | ORAL | Status: AC | PRN

## 2023-05-05 MED ORDER — INSULIN ASPART 100 UNIT/ML IJ SOLN
3.0000 [IU] | Freq: Three times a day (TID) | INTRAMUSCULAR | Status: DC
  Administered 2023-05-06: 3 [IU] via SUBCUTANEOUS
  Filled 2023-05-05: qty 1

## 2023-05-05 MED ORDER — ADULT MULTIVITAMIN W/MINERALS CH
1.0000 | ORAL_TABLET | Freq: Every day | ORAL | Status: DC
  Administered 2023-05-05 – 2023-05-10 (×6): 1 via ORAL
  Filled 2023-05-05 (×6): qty 1

## 2023-05-05 MED ORDER — VANCOMYCIN HCL 2000 MG/400ML IV SOLN
2000.0000 mg | Freq: Once | INTRAVENOUS | Status: AC
  Administered 2023-05-05: 2000 mg via INTRAVENOUS
  Filled 2023-05-05: qty 400

## 2023-05-05 MED ORDER — ENOXAPARIN SODIUM 40 MG/0.4ML IJ SOSY
40.0000 mg | PREFILLED_SYRINGE | INTRAMUSCULAR | Status: DC
  Administered 2023-05-05 – 2023-05-09 (×5): 40 mg via SUBCUTANEOUS
  Filled 2023-05-05 (×5): qty 0.4

## 2023-05-05 MED ORDER — SODIUM CHLORIDE 0.9 % IV SOLN
2.0000 g | Freq: Once | INTRAVENOUS | Status: AC
  Administered 2023-05-05: 2 g via INTRAVENOUS
  Filled 2023-05-05: qty 20

## 2023-05-05 MED ORDER — LORAZEPAM 0.5 MG PO TABS: 0.5000 mg | ORAL_TABLET | ORAL | Status: AC | PRN

## 2023-05-05 MED ORDER — VANCOMYCIN HCL 1250 MG/250ML IV SOLN
1250.0000 mg | Freq: Two times a day (BID) | INTRAVENOUS | Status: DC
  Administered 2023-05-06 – 2023-05-09 (×6): 1250 mg via INTRAVENOUS
  Filled 2023-05-05 (×8): qty 250

## 2023-05-05 MED ORDER — THIAMINE MONONITRATE 100 MG PO TABS
100.0000 mg | ORAL_TABLET | Freq: Every day | ORAL | Status: DC
  Administered 2023-05-05 – 2023-05-10 (×6): 100 mg via ORAL
  Filled 2023-05-05 (×6): qty 1

## 2023-05-05 MED ORDER — SODIUM CHLORIDE 0.9 % IV SOLN
2.0000 g | INTRAVENOUS | Status: DC
  Administered 2023-05-06 – 2023-05-08 (×3): 2 g via INTRAVENOUS
  Filled 2023-05-05 (×3): qty 20

## 2023-05-05 MED ORDER — INSULIN ASPART 100 UNIT/ML IJ SOLN
0.0000 [IU] | Freq: Every day | INTRAMUSCULAR | Status: DC
  Administered 2023-05-07 – 2023-05-09 (×2): 2 [IU] via SUBCUTANEOUS
  Filled 2023-05-05 (×2): qty 1

## 2023-05-05 NOTE — H&P (Signed)
History and Physical    Patient: [name] YQM:[id number] DOB: 05/02/1956 DOA: 05/05/2023 DOS: the patient was seen and examined on 05/05/2023 PCP: Karie Schwalbe, MD  Patient coming from: Home  Chief Complaint:  Chief Complaint  Patient presents with   Foot Injury   HPI: [name] is a 67 y.o. male with medical history significant of type 2 diabetes, gout, hyperlipidemia, hypertension, symptomatic anemia, alcohol use presenting with left fifth toe septic joint versus osteomyelitis.  Patient noted to have had prior diabetic foot infections requiring transmetatarsal foot amputation.  Has been followed by outpatient podiatry for chronic left fifth toe infection.  Has had extended course of oral antibiotics with persistent symptoms.  Still with worsening redness pain.  Was seen by Dr. Graciela Husbands earlier this week with recommendation for inpatient admission with concern for septic joint versus osteomyelitis.  Patient reports not being able to come to the ER for couple days secondary to work issues.  No fevers or chills.  Minimal to mild purulent drainage.  Blood sugars have been in the 200s at home.  Follows with heme-onc for symptomatic anemia.  Anemia felt to be secondary to recurrent infections.  No tobacco use.  One half of 1/5-1/5 of liquor daily.  No abdominal pain.  No nausea or vomiting. Presented to the ER afebrile, hemodynamically stable.  [place] count 5.2, hemoglobin 10.8, platelets 352, creatinine 0.92, glucose 256, sodium 127, potassium 3.3.  Corrected sodium around 131.  Sed rate, CRP, MRI of the left foot pending. Review of Systems: As mentioned in the history of present illness. All other systems reviewed and are negative. Past Medical History:  Diagnosis Date   Adenomatous polyp    Allergy    Anemia    Arthritis    feet    Bunion 04/02/2013   STATUS POST OP BUN REPAIR   Diabetes mellitus    Foot ulcer (HCC)    GERD (gastroesophageal reflux disease)    Gout    Hammertoe     Hyperlipidemia    Hypertension    Neuromuscular disorder (HCC)    neuropathy   Plantar flexed metatarsal    Psoriasis    Schatzki's ring    Past Surgical History:  Procedure Laterality Date   CARPAL TUNNEL RELEASE Left 08/24/2022   Procedure: CARPAL TUNNEL RELEASE;  Surgeon: Deeann Saint, MD;  Location: [facility];  Service: Orthopedics;  Laterality: Left;   CATARACT EXTRACTION W/PHACO Left 03/21/2023   Procedure: CATARACT EXTRACTION PHACO AND INTRAOCULAR LENS PLACEMENT (IOC) LEFT MALYUGIN DIABETIC 9.88 1:01.4;  Surgeon: Lockie Mola, MD;  Location: [facility];  Service: Ophthalmology;  Laterality: Left;   CATARACT EXTRACTION W/PHACO Right 04/04/2023   Procedure: CATARACT EXTRACTION PHACO AND INTRAOCULAR LENS PLACEMENT (IOC) RIGHT MALYUGIN DIABETIC 15.31 01:15.1;  Surgeon: Lockie Mola, MD;  Location: [facility];  Service: Ophthalmology;  Laterality: Right;   COLONOSCOPY     FOOT AMPUTATION Right 01/11/2017   Dr Deborah Chalk ([facility])--transmetatarsal   FOOT SURGERY Right 02/28/2013   Arta Bruce HAM TOE2,3 PINS ,2ND MET OSTEOTOMY, 5TH MET W/SCREW   02-2013, 04-2013   HIP FRACTURE SURGERY  1993   surgery x2   IR BONE MARROW BIOPSY & ASPIRATION  04/27/2023   NASAL SEPTOPLASTY W/ TURBINOPLASTY Bilateral 02/08/2023   Procedure: NASAL SEPTOPLASTY WITH TURBINATE REDUCTION;  Surgeon: Vernie Murders, MD;  Location: [facility];  Service: ENT;  Laterality: Bilateral;   POLYPECTOMY     PROSTATE BIOPSY  10/10/2019   TOTAL HIP ARTHROPLASTY  Left hip replacement/femur fx 07/04, Screw removal left hip- [place] 11/01   Social History:  reports that he has never smoked. He has been exposed to tobacco smoke. He has never used smokeless tobacco. He reports current alcohol use of about 1.0 standard drink of alcohol per week. He reports that he does not use drugs.  Allergies  Allergen Reactions   Glipizide Other (See Comments)    REACTION: wt. gain, hands tingling    Lisinopril Other (See Comments)    HYPERKALEMIA   Ramipril Cough   Ibuprofen     UNSPECIFIED REACTION    Bactrim [Sulfamethoxazole-Trimethoprim] Nausea And Vomiting and Rash    Family History  Problem Relation Age of Onset   Diabetes Mother    Diabetes Brother    Alzheimer's disease Maternal Aunt    Alzheimer's disease Cousin    Cancer Neg Hx    Colon cancer Neg Hx    Colon polyps Neg Hx    Rectal cancer Neg Hx    Stomach cancer Neg Hx    Esophageal cancer Neg Hx     Prior to Admission medications   Medication Sig Start Date End Date Taking? Authorizing Provider  Alpha-Lipoic Acid 600 MG TABS Take by mouth. 2 in am and 1 in pm    [provider]  amLODipine (NORVASC) 5 MG tablet TAKE 1 TABLET(5 MG) BY MOUTH DAILY 05/15/22   Karie Schwalbe, MD  B-D UF III MINI PEN NEEDLES 31G X 5 MM MISC USE AS DIRECTED DAILY 01/30/23   Tillman Abide I, MD  calcipotriene (DOVONOX) 0.005 % ointment Apply topically 2 (two) times daily. 12/15/22   [provider]  CALCIUM-MAGNESIUM-ZINC PO Take by mouth.    [provider]  Cinnamon 500 MG capsule Take 500 mg by mouth 2 (two) times daily.    [provider]  clobetasol ointment (TEMOVATE) 0.05 % Apply topically. 11/30/22   [provider]  CONTOUR NEXT TEST test strip USE 1 TO 2 TIMES DAILY TO  CHECK BLOOD SUGAR 07/11/22   Tillman Abide I, MD  cyanocobalamin (VITAMIN B12) 1000 MCG/ML injection 1 ml injection-once a month. 04/19/23   Earna Coder, MD  DULoxetine (CYMBALTA) 60 MG capsule Take 1 capsule (60 mg total) by mouth daily. 09/18/22   Karie Schwalbe, MD  finasteride (PROSCAR) 5 MG tablet TAKE 1 TABLET BY MOUTH DAILY 01/30/23   Vanna Scotland, MD  furosemide (LASIX) 20 MG tablet Take 1 tablet (20 mg total) by mouth daily as needed. 02/14/19   Karie Schwalbe, MD  hydrocortisone valerate ointment (WEST-CORT) 0.2 % APPLY TOPICALLY TO AFFECTED  AREA(S) TWICE DAILY 12/27/22   Karie Schwalbe,  MD  hydrOXYzine (VISTARIL) 25 MG capsule Take 1 capsule (25 mg total) by mouth every 8 (eight) hours as needed for itching. 09/18/22   Karie Schwalbe, MD  indomethacin (INDOCIN) 25 MG capsule Take 25 mg by mouth.    [provider]  insulin glargine (LANTUS SOLOSTAR) 100 UNIT/ML Solostar Pen Inject 15 Units into the skin daily. 12/20/22   Karie Schwalbe, MD  Ixekizumab (TALTZ) 80 MG/ML SOAJ Inject 80 mg into the skin every 28 (twenty-eight) days. For maintenance. 10/11/22   Deirdre Evener, MD  losartan (COZAAR) 25 MG tablet TAKE 1 TABLET(25 MG) BY MOUTH DAILY 02/14/23   Tillman Abide I, MD  meloxicam (MOBIC) 15 MG tablet Take 15 mg by mouth daily.    [provider]  metFORMIN (GLUCOPHAGE) 1000 MG tablet  TAKE 1 TABLET BY MOUTH TWICE  DAILY WITH MEALS 01/30/23   Tillman Abide I, MD  omeprazole (PRILOSEC) 20 MG capsule TAKE 1 CAPSULE BY MOUTH TWICE  DAILY BEFORE MEALS 12/27/22   Tillman Abide I, MD  simvastatin (ZOCOR) 40 MG tablet TAKE 1 TABLET BY MOUTH AT  BEDTIME 01/30/23   Tillman Abide I, MD  SYRINGE-NEEDLE, DISP, 3 ML (BD SAFETYGLIDE SYRINGE/NEEDLE) 25G X 1" 3 ML MISC Use the needle for IM injection once a  month; or as directed. 10/31/22   Earna Coder, MD  tamsulosin (FLOMAX) 0.4 MG CAPS capsule TAKE 1 CAPSULE BY MOUTH DAILY 01/30/23   Vanna Scotland, MD  triamcinolone lotion (KENALOG) 0.1 % APPLY TO AFFECTED AREA(S)  TOPICALLY 3 TIMES DAILY 01/30/23   Karie Schwalbe, MD    Physical Exam: Vitals:   05/05/23 1159 05/05/23 1200  BP: (!) 165/77   Pulse: 96   Resp: 18   Temp: 98.3 F (36.8 C)   TempSrc: Oral   SpO2: 99%   Weight:  97.5 kg  Height:  5\' 11"  (1.803 m)   Physical Exam Constitutional:      Appearance: He is obese.  HENT:     Head: Normocephalic and atraumatic.     Nose: Nose normal.     Mouth/Throat:     Mouth: Mucous membranes are moist.  Eyes:     Pupils: Pupils are equal, round, and reactive to light.  Cardiovascular:     Rate  and Rhythm: Normal rate and regular rhythm.  Abdominal:     General: Abdomen is flat.  Musculoskeletal:     Comments: Status post right transmetatarsal foot amputation.  Skin:    Comments: See pictures  Neurological:     General: No focal deficit present.     \     Data Reviewed:  There are no new results to review at this time.   Lab Results  Component Value Date   WBC 5.2 05/05/2023   HGB 10.8 (L) 05/05/2023   HCT 31.8 (L) 05/05/2023   MCV 97.5 05/05/2023   PLT 352 05/05/2023    Last metabolic panel Lab Results  Component Value Date   GLUCOSE 256 (H) 05/05/2023   NA 127 (L) 05/05/2023   K 3.3 (L) 05/05/2023   CL 83 (L) 05/05/2023   CO2 29 05/05/2023   BUN 10 05/05/2023   CREATININE 0.92 05/05/2023   GFRNONAA >60 05/05/2023   CALCIUM 9.0 05/05/2023   PHOS 3.5 06/28/2022   PROT 7.3 05/05/2023   ALBUMIN 3.8 05/05/2023   LABGLOB 2.1 12/20/2022   AGRATIO 2.2 12/20/2022   BILITOT 1.1 05/05/2023   ALKPHOS 86 05/05/2023   AST 20 05/05/2023   ALT 11 05/05/2023   ANIONGAP 15 05/05/2023    Assessment and Plan: * Septic joint (HCC) Left fifth toe redness and swelling concerning for osteomyelitis versus septic joint Recently evaluated by Dr. Alberteen Spindle with podiatry-sent to ER for further evaluation Started on IV Rocephin, Flagyl vancomycin for infectious coverage MRI of the left foot pending Blood cultures Sed rate, CRP Follow-up podiatry recommendations   Alcohol use Patient reports one half of 1/5-1/5 of liquor intake daily Discussed importance of cessation  CIWA protocol  Follow    Symptomatic anemia Hemoglobin 10.8 today. Followed by Dr. Donneta Romberg outpatient Continue B12 and iron Monitor  GERD PPI  Hyperlipemia Statin  DM (diabetes mellitus) type II controlled, neurological manifestation (HCC) SSI A1c      Advance Care Planning:   Code  Status: Full Code   Consults: Podiatry   Family Communication: Wife at the bedside   Severity of  Illness: The appropriate patient status for this patient is INPATIENT. Inpatient status is judged to be reasonable and necessary in order to provide the required intensity of service to ensure the patient's safety. The patient's presenting symptoms, physical exam findings, and initial radiographic and laboratory data in the context of their chronic comorbidities is felt to place them at high risk for further clinical deterioration. Furthermore, it is not anticipated that the patient will be medically stable for discharge from the hospital within 2 midnights of admission.   * I certify that at the point of admission it is my clinical judgment that the patient will require inpatient hospital care spanning beyond 2 midnights from the point of admission due to high intensity of service, high risk for further deterioration and high frequency of surveillance required.*  Author: Floydene Flock, MD 05/05/2023 4:14 PM  For on call review [email or web address].

## 2023-05-05 NOTE — ED Notes (Signed)
EDP at bedside  

## 2023-05-05 NOTE — ED Notes (Signed)
Pt ambulated to bathroom and back to room independently, gait steady.

## 2023-05-05 NOTE — ED Triage Notes (Signed)
Pt sts that he came to the ED from Dr. Wilmon Arms office for a MRI with labs due to a foot infection and needing to have IV abx and to be admitted.

## 2023-05-05 NOTE — Assessment & Plan Note (Addendum)
Left fifth toe redness and swelling concerning for osteomyelitis versus septic joint Recently evaluated by Dr. Alberteen Spindle with podiatry-sent to ER for further evaluation Started on IV Rocephin, Flagyl vancomycin for infectious coverage MRI of the left foot pending Blood cultures Sed rate, CRP Follow-up podiatry recommendations

## 2023-05-05 NOTE — Assessment & Plan Note (Signed)
Hemoglobin 10.8 today. Followed by Dr. Donneta Romberg outpatient Continue B12 and iron Monitor

## 2023-05-05 NOTE — ED Provider Notes (Signed)
[facility] Provider Note    Event Date/Time   First MD Initiated Contact with Patient 05/05/23 1413     (approximate)   History   Foot Injury   HPI [name] is a 67 y.o. male DM2, HTN, HLD presenting today for foot wound.  Patient reports infection to his left fifth toe which he saw his podiatrist for 2 days ago.  Was told to come to the emergency department at that time for admission for IV antibiotics and MRI.  Has reportedly been on Augmentin for 2 to 4 weeks and then recently switched to doxycycline with no improvement in symptoms.  Denies any recent fevers or chills.  Prior history of right midfoot amputation from diabetic ulcers.  Chart review: Office visit from 2 days ago with x-ray showing concern for possible septic joint at the level of the fifth metatarsal interaction with the left fifth digit.     Physical Exam   Triage Vital Signs: ED Triage Vitals  Encounter Vitals Group     BP 05/05/23 1159 (!) 165/77     Systolic BP Percentile --      Diastolic BP Percentile --      Pulse Rate 05/05/23 1159 96     Resp 05/05/23 1159 18     Temp 05/05/23 1159 98.3 F (36.8 C)     Temp Source 05/05/23 1159 Oral     SpO2 05/05/23 1159 99 %     Weight 05/05/23 1200 215 lb (97.5 kg)     Height 05/05/23 1200 5\' 11"  (1.803 m)     Head Circumference --      Peak Flow --      Pain Score 05/05/23 1200 5     Pain Loc --      Pain Education --      Exclude from Growth Chart --     Most recent vital signs: Vitals:   05/05/23 1159  BP: (!) 165/77  Pulse: 96  Resp: 18  Temp: 98.3 F (36.8 C)  SpO2: 99%   Physical Exam: I have reviewed the vital signs and nursing notes. General: Awake, alert, no acute distress.  Nontoxic appearing. Head:  Atraumatic, normocephalic.   ENT:  EOM intact, PERRL. Oral mucosa is pink and moist with no lesions. Neck: Neck is supple with full range of motion, No meningeal signs. Cardiovascular:  RRR, No murmurs.  Peripheral pulses palpable and equal bilaterally. Respiratory:  Symmetrical chest wall expansion.  No rhonchi, rales, or wheezes.  Good air movement throughout.  No use of accessory muscles.   Musculoskeletal:  No cyanosis or edema. Moving extremities with full ROM.  Midfoot amputation of the right foot present. Abdomen:  Soft, nontender, nondistended. Neuro:  GCS 15, moving all four extremities, interacting appropriately. Speech clear. Psych:  Calm, appropriate.   Skin: Slight erythema noted to the toes of the left foot extending proximally to the midfoot.  Ulcer present beneath the base of the left fifth MTP.  2 ulcers present to the metatarsals on the right foot as well.   ED Results / Procedures / Treatments   Labs (all labs ordered are listed, but only abnormal results are displayed) Labs Reviewed  COMPREHENSIVE METABOLIC PANEL - Abnormal; Notable for the following components:      Result Value   Sodium 127 (*)    Potassium 3.3 (*)    Chloride 83 (*)    Glucose, Bld 256 (*)    All other components within  normal limits  CBC WITH DIFFERENTIAL/PLATELET - Abnormal; Notable for the following components:   RBC 3.26 (*)    Hemoglobin 10.8 (*)    HCT 31.8 (*)    All other components within normal limits  LACTIC ACID, PLASMA  LACTIC ACID, PLASMA  URINALYSIS, W/ REFLEX TO CULTURE (INFECTION SUSPECTED)     EKG    RADIOLOGY    PROCEDURES:  Critical Care performed: No  Procedures   MEDICATIONS ORDERED IN ED: Medications  cefTRIAXone (ROCEPHIN) 2 g in sodium chloride 0.9 % 100 mL IVPB (has no administration in time range)  vancomycin (VANCOREADY) IVPB 2000 mg/400 mL (has no administration in time range)     IMPRESSION / MDM / ASSESSMENT AND PLAN / ED COURSE  I reviewed the triage vital signs and the nursing notes.                              Differential diagnosis includes, but is not limited to, diabetic ulcer, osteomyelitis, septic joint.  Patient's presentation  is most consistent with acute complicated illness / injury requiring diagnostic workup.  Patient is a 1 55-year-old male sent in from podiatry clinic for evidence of infection of his left fifth digit.  No systemic symptoms at this time with vital signs stable.  Laboratory workup largely reassuring aside from hyperglycemia at this time.  Spoke with Dr. Alberteen Spindle and Dr. Ether Griffins on-call with podiatry.  Patient will be admitted for IV antibiotics, MRI, vascular consult, and likely plan for surgical removal of the fifth digit pending completion of workup.  Vascular surgery was messaged.  Patient was admitted to hospitalist for further care.  The patient is on the cardiac monitor to evaluate for evidence of arrhythmia and/or significant heart rate changes.     FINAL CLINICAL IMPRESSION(S) / ED DIAGNOSES   Final diagnoses:  Diabetic ulcer of toe of left foot associated with diabetes mellitus due to underlying condition, with fat layer exposed (HCC)     Rx / DC Orders   ED Discharge Orders     None        Note:  This document was prepared using Dragon voice recognition software and may include unintentional dictation errors.   Janith Lima, MD 05/05/23 [phone]

## 2023-05-05 NOTE — Consult Note (Signed)
Pharmacy Antibiotic Note  [name] is a 67 y.o. male admitted on 05/05/2023 with DFI with concern for osteomyelitis.  Pharmacy has been consulted for vancomycin dosing.  Plan: Give vancomycin 2000 mg IV x 1, then start vancomycin 1250 mg IV every 12 hours Estimated AUC 486, Cmin 14.1 IBW, Scr 0.92, Vd 0.72 Vancomycin levels as clinically indicated Ceftriaxone 2 grams IV every 24 hours per provider Flagyl 500 mg IV every 12 hours per provider Follow renal function for dose adjustments   Height: 5\' 11"  (180.3 cm) Weight: 97.5 kg (215 lb) IBW/kg (Calculated) : 75.3  Temp (24hrs), Avg:98.3 F (36.8 C), Min:98.3 F (36.8 C), Max:98.3 F (36.8 C)  Recent Labs  Lab 05/05/23 1201  WBC 5.2  CREATININE 0.92  LATICACIDVEN 1.6    Estimated Creatinine Clearance: 92.8 mL/min (by C-G formula based on SCr of 0.92 mg/dL).    Allergies  Allergen Reactions   Glipizide Other (See Comments)    REACTION: wt. gain, hands tingling   Lisinopril Other (See Comments)    HYPERKALEMIA   Ramipril Cough   Ibuprofen     UNSPECIFIED REACTION    Bactrim [Sulfamethoxazole-Trimethoprim] Nausea And Vomiting and Rash    Antimicrobials this admission: vancomycin 10/12 >>  ceftriaxone 10/12 >>  Flagyl 10/12 >>  Dose adjustments this admission: N/A  Microbiology results: 10/12 bcx: pending   Thank you for allowing pharmacy to be a part of this patient's care.  Barrie Folk, PharmD 05/05/2023 3:50 PM

## 2023-05-05 NOTE — Assessment & Plan Note (Signed)
Statin

## 2023-05-05 NOTE — Assessment & Plan Note (Signed)
Patient reports one half of 1/5-1/5 of liquor intake daily Discussed importance of cessation  CIWA protocol  Follow

## 2023-05-05 NOTE — Assessment & Plan Note (Signed)
SSI A1c

## 2023-05-05 NOTE — Assessment & Plan Note (Signed)
PPI ?

## 2023-05-05 NOTE — Consult Note (Signed)
PHARMACY -  BRIEF ANTIBIOTIC NOTE   Pharmacy has received consult(s) for vancomycin from an ED provider.  The patient's profile has been reviewed for ht/wt/allergies/indication/available labs.    One time order(s) placed for vancomycin 2000 mg  Further antibiotics/pharmacy consults should be ordered by admitting physician if indicated.                       Thank you,  Celene Squibb, PharmD Clinical Pharmacist 05/05/2023 2:42 PM

## 2023-05-05 NOTE — Consult Note (Signed)
PHARMACY -  BRIEF ANTIBIOTIC NOTE   Pharmacy has received consult(s) for vancomycin dosing from an ED provider.  The patient's profile has been reviewed for ht/wt/allergies/indication/available labs.    One time order(s) placed for vancomycin 2000 mg IV x 1  Further antibiotics/pharmacy consults should be ordered by admitting physician if indicated.                       Thank you, Barrie Folk 05/05/2023  2:42 PM

## 2023-05-06 ENCOUNTER — Encounter: Payer: Self-pay | Admitting: Family Medicine

## 2023-05-06 DIAGNOSIS — M869 Osteomyelitis, unspecified: Secondary | ICD-10-CM | POA: Diagnosis not present

## 2023-05-06 DIAGNOSIS — M009 Pyogenic arthritis, unspecified: Secondary | ICD-10-CM | POA: Diagnosis not present

## 2023-05-06 LAB — MAGNESIUM: Magnesium: 1.5 mg/dL — ABNORMAL LOW (ref 1.7–2.4)

## 2023-05-06 LAB — GLUCOSE, CAPILLARY
Glucose-Capillary: 163 mg/dL — ABNORMAL HIGH (ref 70–99)
Glucose-Capillary: 160 mg/dL — ABNORMAL HIGH (ref 70–99)
Glucose-Capillary: 181 mg/dL — ABNORMAL HIGH (ref 70–99)

## 2023-05-06 LAB — PREALBUMIN: Prealbumin: 17 mg/dL — ABNORMAL LOW (ref 18–38)

## 2023-05-06 LAB — C-REACTIVE PROTEIN: CRP: 3.4 mg/dL — ABNORMAL HIGH (ref <1.0)

## 2023-05-06 LAB — HIV ANTIBODY (ROUTINE TESTING W REFLEX): HIV Screen 4th Generation wRfx: NONREACTIVE

## 2023-05-06 LAB — CBG MONITORING, ED: Glucose-Capillary: 124 mg/dL — ABNORMAL HIGH (ref 70–99)

## 2023-05-06 MED ORDER — LOSARTAN POTASSIUM 25 MG PO TABS
25.0000 mg | ORAL_TABLET | Freq: Every day | ORAL | Status: DC
  Administered 2023-05-06 – 2023-05-10 (×5): 25 mg via ORAL
  Filled 2023-05-06 (×5): qty 1

## 2023-05-06 MED ORDER — SIMVASTATIN 20 MG PO TABS
40.0000 mg | ORAL_TABLET | Freq: Every day | ORAL | Status: DC
  Administered 2023-05-06 – 2023-05-09 (×4): 40 mg via ORAL
  Filled 2023-05-06 (×4): qty 2

## 2023-05-06 MED ORDER — POTASSIUM CHLORIDE CRYS ER 20 MEQ PO TBCR
40.0000 meq | EXTENDED_RELEASE_TABLET | Freq: Once | ORAL | Status: AC
  Administered 2023-05-06: 40 meq via ORAL
  Filled 2023-05-06: qty 2

## 2023-05-06 MED ORDER — MAGNESIUM SULFATE 2 GM/50ML IV SOLN
2.0000 g | Freq: Once | INTRAVENOUS | Status: AC
  Administered 2023-05-06: 2 g via INTRAVENOUS
  Filled 2023-05-06: qty 50

## 2023-05-06 MED ORDER — INSULIN GLARGINE-YFGN 100 UNIT/ML ~~LOC~~ SOLN
10.0000 [IU] | Freq: Every day | SUBCUTANEOUS | Status: DC
  Administered 2023-05-06: 10 [IU] via SUBCUTANEOUS
  Filled 2023-05-06 (×3): qty 0.1

## 2023-05-06 MED ORDER — CHLORDIAZEPOXIDE HCL 25 MG PO CAPS
25.0000 mg | ORAL_CAPSULE | Freq: Four times a day (QID) | ORAL | Status: DC
  Administered 2023-05-07 (×2): 25 mg via ORAL
  Filled 2023-05-06 (×3): qty 1

## 2023-05-06 MED ORDER — TAMSULOSIN HCL 0.4 MG PO CAPS
0.4000 mg | ORAL_CAPSULE | Freq: Every day | ORAL | Status: DC
  Administered 2023-05-06 – 2023-05-09 (×4): 0.4 mg via ORAL
  Filled 2023-05-06 (×4): qty 1

## 2023-05-06 MED ORDER — FINASTERIDE 5 MG PO TABS
5.0000 mg | ORAL_TABLET | Freq: Every day | ORAL | Status: DC
  Administered 2023-05-06 – 2023-05-10 (×5): 5 mg via ORAL
  Filled 2023-05-06 (×5): qty 1

## 2023-05-06 MED ORDER — DULOXETINE HCL 30 MG PO CPEP
60.0000 mg | ORAL_CAPSULE | Freq: Every day | ORAL | Status: DC
  Administered 2023-05-06 – 2023-05-10 (×5): 60 mg via ORAL
  Filled 2023-05-06 (×5): qty 2

## 2023-05-06 MED ORDER — AMLODIPINE BESYLATE 5 MG PO TABS
5.0000 mg | ORAL_TABLET | Freq: Every day | ORAL | Status: DC
  Administered 2023-05-06 – 2023-05-10 (×5): 5 mg via ORAL
  Filled 2023-05-06 (×5): qty 1

## 2023-05-06 NOTE — TOC Initial Note (Signed)
Transition of Care [facility]) - Initial/Assessment Note    Patient Details  Name: [name] MRN: [id number] Date of Birth: Mar 10, 1956  Transition of Care [facility]) CM/SW Contact:    Colette Ribas, LCSWA Phone Number: 05/06/2023, 9:46 AM  Clinical Narrative:                    CSW received consult for SA resources. SA resources added to AVS.     Patient Goals and CMS Choice            Expected Discharge Plan and Services                                              Prior Living Arrangements/Services                       Activities of Daily Living      Permission Sought/Granted                  Emotional Assessment              Admission diagnosis:  Septic joint [facility]) [M00.9] Patient Active Problem List   Diagnosis Date Noted   Septic joint (HCC) 05/05/2023   Alcohol use 05/05/2023   Carpal tunnel syndrome of left wrist 01/05/2023   Carpal tunnel syndrome of right wrist 01/01/2023   Numbness of hand 01/01/2023   BPH (benign prostatic hyperplasia) 12/20/2022   B12 deficiency 10/31/2022   Allergic reaction to sulfonamide 06/19/2022   Left hand pain 06/19/2022   Symptomatic anemia 06/12/2022   Chronic foot ulcer, left, limited to breakdown of skin (HCC) 12/14/2021   Allergic reaction 02/24/2021   Elevated PSA 02/14/2019   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 (HCC) 03/12/2018   Adenomatous polyp    Routine general medical examination at a health care facility 04/17/2011   Osteoarthritis 03/17/2007   DM (diabetes mellitus) type II controlled, neurological manifestation (HCC) 03/13/2007   Hyperlipemia 03/13/2007   GOUT 03/13/2007   Essential hypertension, benign 03/13/2007   GERD 03/13/2007   PSORIASIS 03/13/2007   PCP:  Karie Schwalbe, MD Pharmacy:   [facility] [address] [place] - [address] [place] [address] Phone: [phone] Fax: [phone]  [facility] [phone] [place], [place] - [address] [place] [address] Phone: [phone] Fax: [phone]  [facility]) - [place], [place] - [address] [place] [address] [place] [address] Phone: [phone] Fax: [phone]  [id number] [place], [place] - [address] AT [facility] [address] [place] [address] Phone: [phone] Fax: [phone]  [facility] - [place], [place] - [address] [place] [address] Phone: [phone] Fax: [phone]  [facility], [place], [place] - [address] [place] [address] Phone: [phone] Fax: [phone]  [facility] - [place], [place] - [address] [place] [address] Phone: [phone] Fax: [phone]  [facility] - [place], [place] - [address] [place] [address]  Phone: [phone] Fax: [phone]     Social Determinants of Health (SDOH) Social History: SDOH Screenings   Depression (PHQ2-9): Low Risk  (12/20/2022)  Tobacco Use: Low Risk  (05/03/2023)   Received from [facility]   SDOH Interventions:     Readmission Risk Interventions     No data to display

## 2023-05-06 NOTE — ED Notes (Signed)
Pt ambulated to bathroom and back to bed independently.  Gait steady.

## 2023-05-06 NOTE — Consult Note (Signed)
[facility] [place]  ASSESSMENT / PLAN: 67 y.o. male with left diabetic foot infection with left fifth toe / metatarsal osteomyelitis and septic arthritis.  His vascular exam is reassuring. Will check ABI / Toe pressure to predict wound healing.   CHIEF COMPLAINT: left diabetic foot infection  HISTORY OF PRESENT ILLNESS: [name] is a 67 y.o. male admitted to the internal medicine service for treatment of left diabetic foot infection.  MRI shows significant osteomyelitis of the left fifth metatarsal and phalanx with possible septic arthritis of the metatarsal pharyngeal joint.  Patient reports his ulcer developed several weeks ago.  He does not report any anesthesia symptoms typical of peripheral arterial disease.  He specifically denies claudication or ischemic rest pain symptoms.  He is status post right transmetatarsal amputation.  He does have a callus and superficial ulcer on the plantar aspect of his right transmetatarsal amputation as well.   Past Medical History:  Diagnosis Date   Adenomatous polyp    Allergy    Anemia    Arthritis    feet    Bunion 04/02/2013   STATUS POST OP BUN REPAIR   Diabetes mellitus    Foot ulcer (HCC)    GERD (gastroesophageal reflux disease)    Gout    Hammertoe    Hyperlipidemia    Hypertension    Neuromuscular disorder (HCC)    neuropathy   Plantar flexed metatarsal    Psoriasis    Schatzki's ring     Past Surgical History:  Procedure Laterality Date   CARPAL TUNNEL RELEASE Left 08/24/2022   Procedure: CARPAL TUNNEL RELEASE;  Surgeon: Deeann Saint, MD;  Location: [facility] ORS;  Service: Orthopedics;  Laterality: Left;   CATARACT EXTRACTION W/PHACO Left 03/21/2023   Procedure: CATARACT EXTRACTION PHACO AND INTRAOCULAR LENS PLACEMENT (IOC) LEFT MALYUGIN DIABETIC 9.88 1:01.4;  Surgeon: Lockie Mola, MD;  Location: [facility];  Service: Ophthalmology;  Laterality: Left;   CATARACT EXTRACTION W/PHACO  Right 04/04/2023   Procedure: CATARACT EXTRACTION PHACO AND INTRAOCULAR LENS PLACEMENT (IOC) RIGHT MALYUGIN DIABETIC 15.31 01:15.1;  Surgeon: Lockie Mola, MD;  Location: [facility];  Service: Ophthalmology;  Laterality: Right;   COLONOSCOPY     FOOT AMPUTATION Right 01/11/2017   Dr Deborah Chalk ([facility])--transmetatarsal   FOOT SURGERY Right 02/28/2013   Arta Bruce HAM TOE2,3 PINS ,2ND MET OSTEOTOMY, 5TH MET W/SCREW   02-2013, 04-2013   HIP FRACTURE SURGERY  1993   surgery x2   IR BONE MARROW BIOPSY & ASPIRATION  04/27/2023   NASAL SEPTOPLASTY W/ TURBINOPLASTY Bilateral 02/08/2023   Procedure: NASAL SEPTOPLASTY WITH TURBINATE REDUCTION;  Surgeon: Vernie Murders, MD;  Location: [facility];  Service: ENT;  Laterality: Bilateral;   POLYPECTOMY     PROSTATE BIOPSY  10/10/2019   TOTAL HIP ARTHROPLASTY     Left hip replacement/femur fx 07/04, Screw removal left hip- Hines 11/01    Family History  Problem Relation Age of Onset   Diabetes Mother    Diabetes Brother    Alzheimer's disease Maternal Aunt    Alzheimer's disease Cousin    Cancer Neg Hx    Colon cancer Neg Hx    Colon polyps Neg Hx    Rectal cancer Neg Hx    Stomach cancer Neg Hx    Esophageal cancer Neg Hx     Social History   Socioeconomic History   Marital status: Married    Spouse name: Not on file   Number of children: 3  Years of education: Not on file   Highest education level: Not on file  Occupational History   Occupation: PC ADMINISTRATOR    Employer: [employer]  Tobacco Use   Smoking status: Never    Passive exposure: Past   Smokeless tobacco: Never  Vaping Use   Vaping status: Never Used  Substance and Sexual Activity   Alcohol use: Yes    Alcohol/week: 1.0 standard drink of alcohol    Types: 1 Standard drinks or equivalent per week    Comment: occasional (DWI 1991)   Drug use: No   Sexual activity: Yes    Birth control/protection: None  Other Topics Concern   Not on file  Social  History Narrative    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 With wife; and 2 dog.     Social Determinants of Health   Financial Resource Strain: Not on file  Food Insecurity: Not on file  Transportation Needs: Not on file  Physical Activity: Not on file  Stress: Not on file  Social Connections: Not on file  Intimate Partner Violence: Not on file    Allergies  Allergen Reactions   Glipizide Other (See Comments)    REACTION: wt. gain, hands tingling   Lisinopril Other (See Comments)    HYPERKALEMIA   Ramipril Cough   Ibuprofen     UNSPECIFIED REACTION    Bactrim [Sulfamethoxazole-Trimethoprim] Nausea And Vomiting and Rash    Current Facility-Administered Medications  Medication Dose Route Frequency Provider Last Rate Last Admin   cefTRIAXone (ROCEPHIN) 2 g in sodium chloride 0.9 % 100 mL IVPB  2 g Intravenous Q24H Floydene Flock, MD 200 mL/hr at 05/06/23 1001 2 g at 05/06/23 1001   And   metroNIDAZOLE (FLAGYL) IVPB 500 mg  500 mg Intravenous Q8H Floydene Flock, MD 100 mL/hr at 05/06/23 1002 500 mg at 05/06/23 1002   enoxaparin (LOVENOX) injection 40 mg  40 mg Subcutaneous Q24H Floydene Flock, MD   40 mg at 05/05/23 2040   folic acid (FOLVITE) tablet 1 mg  1 mg Oral Daily Floydene Flock, MD   1 mg at 05/06/23 0955   insulin aspart (novoLOG) injection 0-15 Units  0-15 Units Subcutaneous TID WC Floydene Flock, MD   2 Units at 05/06/23 0955   insulin aspart (novoLOG) injection 0-5 Units  0-5 Units Subcutaneous QHS Floydene Flock, MD       insulin aspart (novoLOG) injection 3 Units  3 Units Subcutaneous TID WC Floydene Flock, MD   3 Units at 05/06/23 0956   LORazepam (ATIVAN) tablet 1-4 mg  1-4 mg Oral Q1H PRN Floydene Flock, MD       Or   LORazepam (ATIVAN) tablet 0.5 mg  0.5 mg Oral Q4H PRN Floydene Flock, MD       multivitamin with minerals tablet 1 tablet  1 tablet Oral Daily Floydene Flock, MD   1 tablet at  05/06/23 2952   thiamine (VITAMIN B1) tablet 100 mg  100 mg Oral Daily Floydene Flock, MD   100 mg at 05/06/23 8413   Or   thiamine (VITAMIN B1) injection 100 mg  100 mg Intravenous Daily Floydene Flock, MD       vancomycin Luna Kitchens) IVPB 1250 mg/250 mL  1,250 mg Intravenous Q12H Barrie Folk, [facility]   Stopped at 05/06/23 2440   Current Outpatient Medications  Medication Sig Dispense Refill  Alpha-Lipoic Acid 600 MG TABS Take by mouth. 2 in am and 1 in pm     amLODipine (NORVASC) 5 MG tablet TAKE 1 TABLET(5 MG) BY MOUTH DAILY (Patient taking differently: Take 5 mg by mouth daily.) 90 tablet 3   doxycycline (VIBRAMYCIN) 100 MG capsule Take 1 capsule by mouth 2 (two) times daily.     DULoxetine (CYMBALTA) 60 MG capsule Take 1 capsule (60 mg total) by mouth daily. 90 capsule 3   finasteride (PROSCAR) 5 MG tablet TAKE 1 TABLET BY MOUTH DAILY 90 tablet 3   insulin glargine (LANTUS SOLOSTAR) 100 UNIT/ML Solostar Pen Inject 15 Units into the skin daily. 1 mL 0   Ixekizumab (TALTZ) 80 MG/ML SOAJ Inject 80 mg into the skin every 28 (twenty-eight) days. For maintenance. 1 mL 4   losartan (COZAAR) 25 MG tablet TAKE 1 TABLET(25 MG) BY MOUTH DAILY 90 tablet 3   metFORMIN (GLUCOPHAGE) 1000 MG tablet TAKE 1 TABLET BY MOUTH TWICE  DAILY WITH MEALS 180 tablet 3   omeprazole (PRILOSEC) 20 MG capsule TAKE 1 CAPSULE BY MOUTH TWICE  DAILY BEFORE MEALS (Patient taking differently: Take 20 mg by mouth daily.) 180 capsule 3   simvastatin (ZOCOR) 40 MG tablet TAKE 1 TABLET BY MOUTH AT  BEDTIME 90 tablet 3   tamsulosin (FLOMAX) 0.4 MG CAPS capsule TAKE 1 CAPSULE BY MOUTH DAILY 90 capsule 3   B-D UF III MINI PEN NEEDLES 31G X 5 MM MISC USE AS DIRECTED DAILY 90 each 3   calcipotriene (DOVONOX) 0.005 % ointment Apply topically 2 (two) times daily.     CALCIUM-MAGNESIUM-ZINC PO Take by mouth.     Cinnamon 500 MG capsule Take 500 mg by mouth 2 (two) times daily.     clobetasol ointment (TEMOVATE) 0.05 % Apply  topically.     CONTOUR NEXT TEST test strip USE 1 TO 2 TIMES DAILY TO  CHECK BLOOD SUGAR 200 strip 3   cyanocobalamin (VITAMIN B12) 1000 MCG/ML injection 1 ml injection-once a month. (Patient not taking: Reported on 05/05/2023) 12 mL 1   furosemide (LASIX) 20 MG tablet Take 1 tablet (20 mg total) by mouth daily as needed. 90 tablet 3   hydrocortisone valerate ointment (WEST-CORT) 0.2 % APPLY TOPICALLY TO AFFECTED  AREA(S) TWICE DAILY 120 g 1   hydrOXYzine (VISTARIL) 25 MG capsule Take 1 capsule (25 mg total) by mouth every 8 (eight) hours as needed for itching. (Patient not taking: Reported on 05/05/2023) 60 capsule 0   indomethacin (INDOCIN) 25 MG capsule Take 25 mg by mouth.     meloxicam (MOBIC) 15 MG tablet Take 15 mg by mouth daily. (Patient not taking: Reported on 05/05/2023)     SYRINGE-NEEDLE, DISP, 3 ML (BD SAFETYGLIDE SYRINGE/NEEDLE) 25G X 1" 3 ML MISC Use the needle for IM injection once a  month; or as directed. 30 each 1   triamcinolone lotion (KENALOG) 0.1 % APPLY TO AFFECTED AREA(S)  TOPICALLY 3 TIMES DAILY 180 mL 1    PHYSICAL EXAM Vitals:   05/06/23 0808 05/06/23 0830 05/06/23 0900 05/06/23 0930  BP: (!) 143/85 (!) 156/87 (!) 153/76 (!) 150/79  Pulse: 84 79 96 89  Resp: 17     Temp: 97.8 F (36.6 C)     TempSrc: Axillary     SpO2: 96% 95% 93% 95%  Weight:      Height:       Elderly man in no distress Regular rate and rhythm Unlabored breathing Palpable posterior tibial pulses  bilaterally Palpable popliteal pulses bilaterally Status post right transmetatarsal amputation Superficial callus x 2 on the plantar aspect of the right transmetatarsal amputation Small plantar ulcer on the left lateral foot  PERTINENT LABORATORY AND RADIOLOGIC DATA  Most recent CBC    Latest Ref Rng & Units 05/05/2023   12:01 PM 04/27/2023    7:48 AM 12/20/2022   10:04 AM  CBC  WBC 4.0 - 10.5 K/uL 5.2  6.6  4.8   Hemoglobin 13.0 - 17.0 g/dL 16.1  09.6  04.5   Hematocrit 39.0 - 52.0 %  31.8  30.4  33.9   Platelets 150 - 400 K/uL 352  318  239      Most recent CMP    Latest Ref Rng & Units 05/05/2023   12:01 PM 12/20/2022   10:04 AM 06/28/2022   11:16 AM  CMP  Glucose 70 - 99 mg/dL 409  69  811   BUN 8 - 23 mg/dL 10  10  12    Creatinine 0.61 - 1.24 mg/dL 9.14  7.82  9.56   Sodium 135 - 145 mmol/L 127  131  134   Potassium 3.5 - 5.1 mmol/L 3.3  4.6  4.3   Chloride 98 - 111 mmol/L 83  90  93   CO2 22 - 32 mmol/L 29  25  25    Calcium 8.9 - 10.3 mg/dL 9.0  9.7  9.6   Total Protein 6.5 - 8.1 g/dL 7.3  6.8    Total Bilirubin 0.3 - 1.2 mg/dL 1.1  0.5    Alkaline Phos 38 - 126 U/L 86  62    AST 15 - 41 U/L 20  34    ALT 0 - 44 U/L 11  20      Renal function Estimated Creatinine Clearance: 92.8 mL/min (by C-G formula based on SCr of 0.92 mg/dL).  Hgb A1c MFr Bld (%)  Date Value  12/20/2022 5.9 (H)    LDL Chol Calc (NIH)  Date Value Ref Range Status  12/20/2022 52 0 - 99 mg/dL Final     Rande Brunt. Lenell Antu, MD FACS [facility] Phone Number: [phone] 05/06/2023 10:30 AM   Total time spent on preparing this encounter including chart review, data review, collecting history, examining the patient, coordinating care for this new patient, 60 minutes.  Portions of this report may have been transcribed using voice recognition software.  Every effort has been made to ensure accuracy; however, inadvertent computerized transcription errors may still be present.

## 2023-05-06 NOTE — Consult Note (Addendum)
ORTHOPAEDIC CONSULTATION  REQUESTING PHYSICIAN: Lurene Shadow, MD  Chief Complaint: Left foot infection  HPI: [name] is a 67 y.o. male who complains of worsening redness and swelling to his left foot fifth MTPJ.  Seen in the outpatient clinic several days ago.  Noted worsening redness.  Recommendation was for admission at that time for further workup of infection.  Patient has described recent nausea and fatigue with lack of appetite.  No pain to the left foot.  Patient is neuropathic.  Just recently within the last 24 hours appetite has returned somewhat.  Admitted yesterday on IV antibiotics.  Vascular has been consulted and has evaluated patient.  MRI performed yesterday.  Results have been reviewed this morning.  Past Medical History:  Diagnosis Date   Adenomatous polyp    Allergy    Anemia    Arthritis    feet    Bunion 04/02/2013   STATUS POST OP BUN REPAIR   Diabetes mellitus    Foot ulcer (HCC)    GERD (gastroesophageal reflux disease)    Gout    Hammertoe    Hyperlipidemia    Hypertension    Neuromuscular disorder (HCC)    neuropathy   Plantar flexed metatarsal    Psoriasis    Schatzki's ring    Past Surgical History:  Procedure Laterality Date   CARPAL TUNNEL RELEASE Left 08/24/2022   Procedure: CARPAL TUNNEL RELEASE;  Surgeon: Deeann Saint, MD;  Location: [facility] OR[facility];  Service: Orthopedics;  Laterality: Left;   CATARACT EXTRACTION W/PHACO Left 03/21/2023   Procedure: CATARACT EXTRACTION PHACO AND INTRAOCULAR LENS PLACEMENT (IOC) LEFT MALYUGIN DIABETIC 9.88 1:01.4;  Surgeon: Lockie Mola, MD;  Location: [facility];  Service: Ophthalmology;  Laterality: Left;   CATARACT EXTRACTION W/PHACO Right 04/04/2023   Procedure: CATARACT EXTRACTION PHACO AND INTRAOCULAR LENS PLACEMENT (IOC) RIGHT MALYUGIN DIABETIC 15.31 01:15.1;  Surgeon: Lockie Mola, MD;  Location: [facility];  Service: Ophthalmology;  Laterality: Right;   COLONOSCOPY      FOOT AMPUTATION Right 01/11/2017   Dr Deborah Chalk ([facility])--transmetatarsal   FOOT SURGERY Right 02/28/2013   Arta Bruce HAM TOE2,3 PINS ,2ND MET OSTEOTOMY, 5TH MET W/SCREW   02-2013, 04-2013   HIP FRACTURE SURGERY  1993   surgery x2   IR BONE MARROW BIOPSY & ASPIRATION  04/27/2023   NASAL SEPTOPLASTY W/ TURBINOPLASTY Bilateral 02/08/2023   Procedure: NASAL SEPTOPLASTY WITH TURBINATE REDUCTION;  Surgeon: Vernie Murders, MD;  Location: [facility];  Service: ENT;  Laterality: Bilateral;   POLYPECTOMY     PROSTATE BIOPSY  10/10/2019   TOTAL HIP ARTHROPLASTY     Left hip replacement/femur fx 07/04, Screw removal left hip- Hines 11/01   Social History   Socioeconomic History   Marital status: Married    Spouse name: Not on file   Number of children: 3   Years of education: Not on file   Highest education level: Not on file  Occupational History   Occupation: PC ADMINISTRATOR    Employer: [employer]  Tobacco Use   Smoking status: Never    Passive exposure: Past   Smokeless tobacco: Never  Vaping Use   Vaping status: Never Used  Substance and Sexual Activity   Alcohol use: Yes    Alcohol/week: 1.0 standard drink of alcohol    Types: 1 Standard drinks or equivalent per week    Comment: occasional (DWI 1991)   Drug use: No   Sexual activity: Yes    Birth control/protection: None  Other Topics Concern  Not on file  Social History Narrative    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 With wife; and 2 dog.     Social Determinants of Health   Financial Resource Strain: Not on file  Food Insecurity: Not on file  Transportation Needs: Not on file  Physical Activity: Not on file  Stress: Not on file  Social Connections: Not on file   Family History  Problem Relation Age of Onset   Diabetes Mother    Diabetes Brother    Alzheimer's disease Maternal Aunt    Alzheimer's disease Cousin    Cancer Neg Hx    Colon cancer Neg Hx    Colon  polyps Neg Hx    Rectal cancer Neg Hx    Stomach cancer Neg Hx    Esophageal cancer Neg Hx    Allergies  Allergen Reactions   Glipizide Other (See Comments)    REACTION: wt. gain, hands tingling   Lisinopril Other (See Comments)    HYPERKALEMIA   Ramipril Cough   Ibuprofen     UNSPECIFIED REACTION    Bactrim [Sulfamethoxazole-Trimethoprim] Nausea And Vomiting and Rash   Prior to Admission medications   Medication Sig Start Date End Date Taking? Authorizing Provider  Alpha-Lipoic Acid 600 MG TABS Take by mouth. 2 in am and 1 in pm   Yes [provider]  amLODipine (NORVASC) 5 MG tablet TAKE 1 TABLET(5 MG) BY MOUTH DAILY Patient taking differently: Take 5 mg by mouth daily. 05/15/22  Yes Karie Schwalbe, MD  doxycycline (VIBRAMYCIN) 100 MG capsule Take 1 capsule by mouth 2 (two) times daily. 05/03/23 05/10/23 Yes [provider]  DULoxetine (CYMBALTA) 60 MG capsule Take 1 capsule (60 mg total) by mouth daily. 09/18/22  Yes Karie Schwalbe, MD  finasteride (PROSCAR) 5 MG tablet TAKE 1 TABLET BY MOUTH DAILY 01/30/23  Yes Vanna Scotland, MD  insulin glargine (LANTUS SOLOSTAR) 100 UNIT/ML Solostar Pen Inject 15 Units into the skin daily. 12/20/22  Yes Karie Schwalbe, MD  Ixekizumab (TALTZ) 80 MG/ML SOAJ Inject 80 mg into the skin every 28 (twenty-eight) days. For maintenance. 10/11/22  Yes Deirdre Evener, MD  losartan (COZAAR) 25 MG tablet TAKE 1 TABLET(25 MG) BY MOUTH DAILY 02/14/23  Yes Tillman Abide I, MD  metFORMIN (GLUCOPHAGE) 1000 MG tablet TAKE 1 TABLET BY MOUTH TWICE  DAILY WITH MEALS 01/30/23  Yes Karie Schwalbe, MD  omeprazole (PRILOSEC) 20 MG capsule TAKE 1 CAPSULE BY MOUTH TWICE  DAILY BEFORE MEALS Patient taking differently: Take 20 mg by mouth daily. 12/27/22  Yes Tillman Abide I, MD  simvastatin (ZOCOR) 40 MG tablet TAKE 1 TABLET BY MOUTH AT  BEDTIME 01/30/23  Yes Karie Schwalbe, MD  tamsulosin (FLOMAX) 0.4 MG CAPS capsule TAKE 1 CAPSULE BY MOUTH  DAILY 01/30/23  Yes Vanna Scotland, MD  B-D UF III MINI PEN NEEDLES 31G X 5 MM MISC USE AS DIRECTED DAILY 01/30/23   Tillman Abide I, MD  calcipotriene (DOVONOX) 0.005 % ointment Apply topically 2 (two) times daily. 12/15/22   [provider]  CALCIUM-MAGNESIUM-ZINC PO Take by mouth.    [provider]  Cinnamon 500 MG capsule Take 500 mg by mouth 2 (two) times daily.    [provider]  clobetasol ointment (TEMOVATE) 0.05 % Apply topically. 11/30/22   [provider]  CONTOUR NEXT TEST test strip USE 1 TO 2 TIMES DAILY TO  CHECK BLOOD SUGAR  07/11/22   Karie Schwalbe, MD  cyanocobalamin (VITAMIN B12) 1000 MCG/ML injection 1 ml injection-once a month. Patient not taking: Reported on 05/05/2023 04/19/23   Earna Coder, MD  furosemide (LASIX) 20 MG tablet Take 1 tablet (20 mg total) by mouth daily as needed. 02/14/19   Karie Schwalbe, MD  hydrocortisone valerate ointment (WEST-CORT) 0.2 % APPLY TOPICALLY TO AFFECTED  AREA(S) TWICE DAILY 12/27/22   Karie Schwalbe, MD  hydrOXYzine (VISTARIL) 25 MG capsule Take 1 capsule (25 mg total) by mouth every 8 (eight) hours as needed for itching. Patient not taking: Reported on 05/05/2023 09/18/22   Tillman Abide I, MD  indomethacin (INDOCIN) 25 MG capsule Take 25 mg by mouth.    [provider]  meloxicam (MOBIC) 15 MG tablet Take 15 mg by mouth daily. Patient not taking: Reported on 05/05/2023    [provider]  SYRINGE-NEEDLE, DISP, 3 ML (BD SAFETYGLIDE SYRINGE/NEEDLE) 25G X 1" 3 ML MISC Use the needle for IM injection once a  month; or as directed. 10/31/22   Earna Coder, MD  triamcinolone lotion (KENALOG) 0.1 % APPLY TO AFFECTED AREA(S)  TOPICALLY 3 TIMES DAILY 01/30/23   Karie Schwalbe, MD   MR FOOT LEFT WO CONTRAST  Result Date: 05/06/2023 CLINICAL DATA:  Left fifth toe infection. EXAM: MRI OF THE LEFT FOOT WITHOUT CONTRAST TECHNIQUE: Multiplanar, multisequence MR imaging of the  left foot was performed. No intravenous contrast was administered. COMPARISON:  Left foot x-rays dated June 04, 2013. FINDINGS: Despite efforts by the technologist and patient, motion artifact is present on today's exam and could not be eliminated. This reduces exam sensitivity and specificity. Bones/Joint/Cartilage Prominent marrow edema with corresponding decreased T1 marrow signal involving the majority the fifth metatarsal, sparing the base, in the entire fifth proximal phalanx. There is early bone destruction of the fifth metatarsal head and fifth proximal phalanx base. Large fifth MTP joint. Dorsal subluxation of the fifth proximal phalanx with respect to the metatarsal head. No acute fracture. Moderate midfoot and mild first MTP joint osteoarthritis. Ligaments The fifth MTP joint collateral ligaments are torn. Remaining toe collateral ligaments are intact. Muscles and Tendons No tenosynovitis. Near complete atrophy of the intrinsic foot muscles. Soft tissue Small ulcer at the plantar lateral aspect of the fifth metatarsal head with sinus tract extending to bone (series 4, image 30). No soft tissue mass. IMPRESSION: 1. Small ulcer at the plantar lateral aspect of the fifth metatarsal head with sinus tract extending to bone. 2. Septic arthritis of the fifth MTP joint with osteomyelitis of the majority of the fifth metatarsal and entire fifth proximal phalanx. Electronically Signed   By: Obie Dredge M.D.   On: 05/06/2023 09:30    Positive ROS: All other systems have been reviewed and were otherwise negative with the exception of those mentioned in the HPI and as above.  12 point ROS was performed.  Physical Exam: General: Alert and oriented.  No apparent distress.  Vascular:  Left foot:Dorsalis Pedis:  present Posterior Tibial:  present  Right foot: Dorsalis Pedis:  present Posterior Tibial:  present  Neuro:absent protective sensation  Derm: Plantar lateral fifth MTPJ with ulceration.   There is noted surrounding erythema to the left fifth ray and distal lateral midfoot region.  No active drainage.  No lymphangitic streaking.  Ortho/MS: Diffuse edema to the left foot.  Labs reviewed.  Pertinent Labs shows CBC 5.2, CRP 3.4, potassium 3.3, sodium 127.  Assessment: Osteomyelitis left fifth  MTPJ Diabetes with neuropathy Diabetic foot ulcer  Plan: Patient with osteomyelitis left fifth MTPJ based on MRI.  Will need to undergo likely fifth ray amputation.  Vascular evaluation has been performed.  Per patient vascular was able to palpate pulses in was planning to order noninvasive testing.  Will await note but suspect if noninvasive studies shows good blood flow suspect no need for angio.  Will wait on vascular opinion.  Will discuss with Dr. Linus Galas in podiatry and plan for likely fifth ray amputation I&D debridement early this week.   Addendum: Dr. Alberteen Spindle will see pt Monday for likely Tuesday surgery    Irean Hong, DPM Cell [phone]   05/06/2023 10:29 AM

## 2023-05-06 NOTE — Progress Notes (Addendum)
Progress Note    [name]  GMW:[id number] DOB: 02-Jan-1956  DOA: 05/05/2023 PCP: Karie Schwalbe, MD      Brief Narrative:    Medical records reviewed and are as summarized below:  [name] is a 67 y.o. male with medical history significant for type 2 diabetes mellitus, hypertension, hyperlipidemia, gout, chronic anemia, alcohol use disorder, psoriasis, Schatzki's ring, arthritis, neuropathy, who was referred from Dr. Dory Larsen office (podiatrist) because of left foot infection.  He said he has had a wound on both feet.  However, he has been dealing with infection of the left foot for some time now and has been on oral antibiotics on and off for about 4 weeks.  He reported drainage from the left foot wound and noticed increasing redness.  He also complained of nausea and fatigue.  Workup revealed osteomyelitis of the left fifth MTP joint with septic arthritis.    Assessment/Plan:   Principal Problem:   Septic joint (HCC) Active Problems:   DM (diabetes mellitus) type II controlled, neurological manifestation (HCC)   Hyperlipemia   GERD   Symptomatic anemia   Alcohol use   Osteomyelitis of fifth toe of left foot (HCC)    Body mass index is 29.99 kg/m.   Septic arthritis of left fifth MTP joint and osteomyelitis of fifth metatarsal and fifth proximal phalanx: Continue empiric IV antibiotics.  Analgesics as needed for pain. Vascular surgeon has been consulted to assist with management.  Follow-up with podiatrist.   Alcohol use disorder: Start Librium.  Benzodiazepines as needed per CIWA protocol.   Type II DM: He takes insulin glargine 15 units daily.  Reduce dose to 10 units daily for now.  Discontinue scheduled NovoLog for now.  NovoLog as needed for hyperglycemia.  Adjust insulin therapy based on glucose levels.   Hypokalemia: Replete potassium and monitor levels.  Check magnesium and replete as needed.   Hyponatremia: Asymptomatic.  Monitor  electrolytes.   Chronic anemia: he follows with Dr. Donneta Romberg as an outpatient.   Comorbidities include hypertension, GERD, hyperlipidemia  Diet Order             Diet heart healthy/carb modified Fluid consistency: Thin  Diet effective now                            Consultants: Vascular surgeon Podiatrist  Procedures: None    Medications:    amLODipine  5 mg Oral Daily   chlordiazePOXIDE  25 mg Oral QID   DULoxetine  60 mg Oral Daily   enoxaparin (LOVENOX) injection  40 mg Subcutaneous Q24H   finasteride  5 mg Oral Daily   folic acid  1 mg Oral Daily   insulin aspart  0-15 Units Subcutaneous TID WC   insulin aspart  0-5 Units Subcutaneous QHS   insulin glargine-yfgn  10 Units Subcutaneous Daily   losartan  25 mg Oral Daily   multivitamin with minerals  1 tablet Oral Daily   simvastatin  40 mg Oral QHS   tamsulosin  0.4 mg Oral Daily   thiamine  100 mg Oral Daily   Or   thiamine  100 mg Intravenous Daily   Continuous Infusions:  cefTRIAXone (ROCEPHIN)  IV Stopped (05/06/23 1037)   And   metronidazole Stopped (05/06/23 1129)   vancomycin Stopped (05/06/23 4403)     Anti-infectives (From admission, onward)    Start     Dose/Rate Route Frequency Ordered  Stop   05/06/23 1000  cefTRIAXone (ROCEPHIN) 2 g in sodium chloride 0.9 % 100 mL IVPB       Placed in "And" Linked Group   2 g 200 mL/hr over 30 Minutes Intravenous Every 24 hours 05/05/23 1545 05/13/23 0959   05/06/23 0500  vancomycin (VANCOREADY) IVPB 1250 mg/250 mL        1,250 mg 166.7 mL/hr over 90 Minutes Intravenous Every 12 hours 05/05/23 1554     05/05/23 1600  metroNIDAZOLE (FLAGYL) IVPB 500 mg       Placed in "And" Linked Group   500 mg 100 mL/hr over 60 Minutes Intravenous Every 8 hours 05/05/23 1545 05/12/23 1544   05/05/23 1445  vancomycin (VANCOCIN) injection 1,950 mg  Status:  Discontinued        20 mg/kg  97.5 kg Intravenous  Once 05/05/23 1439 05/05/23 1441   05/05/23  1445  cefTRIAXone (ROCEPHIN) 2 g in sodium chloride 0.9 % 100 mL IVPB        2 g 200 mL/hr over 30 Minutes Intravenous  Once 05/05/23 1439 05/05/23 1554   05/05/23 1445  vancomycin (VANCOREADY) IVPB 2000 mg/400 mL        2,000 mg 200 mL/hr over 120 Minutes Intravenous  Once 05/05/23 1441 05/05/23 1913              Family Communication/Anticipated D/C date and plan/Code Status   DVT prophylaxis: enoxaparin (LOVENOX) injection 40 mg Start: 05/05/23 2100     Code Status: Full Code  Family Communication: None Disposition Plan: Plan to discharge home   Status is: Inpatient Remains inpatient appropriate because: Left foot infection       Subjective:   Interval events noted.  He complains of pain and redness on the left foot.  Objective:    Vitals:   05/06/23 0830 05/06/23 0900 05/06/23 0930 05/06/23 1036  BP: (!) 156/87 (!) 153/76 (!) 150/79 (!) 152/76  Pulse: 79 96 89 89  Resp:    18  Temp:    98.6 F (37 C)  TempSrc:      SpO2: 95% 93% 95% 97%  Weight:      Height:       No data found.  No intake or output data in the 24 hours ending 05/06/23 1132 Filed Weights   05/05/23 1200  Weight: 97.5 kg    Exam:  GEN: NAD SKIN: Warm and dry EYES: No pallor or icterus ENT: MMM CV: RRR PULM: CTA B ABD: soft, ND, NT, +BS CNS: AAO x 3, non focal EXT: Right transmetatarsal amputee.  Small dry open wound on plantar aspect of right forefoot.  Small dry open wound on lateral border of left forefoot.  Erythema and mild tenderness on the dorsal and lateral aspect of the left foot        Data Reviewed:   I have personally reviewed following labs and imaging studies:  Labs: Labs show the following:   Basic Metabolic Panel: Recent Labs  Lab 05/05/23 1201  NA 127*  K 3.3*  CL 83*  CO2 29  GLUCOSE 256*  BUN 10  CREATININE 0.92  CALCIUM 9.0   GFR Estimated Creatinine Clearance: 92.8 mL/min (by C-G formula based on SCr of 0.92 mg/dL). Liver  Function Tests: Recent Labs  Lab 05/05/23 1201  AST 20  ALT 11  ALKPHOS 86  BILITOT 1.1  PROT 7.3  ALBUMIN 3.8   No results for input(s): "LIPASE", "AMYLASE" in the last 168 hours. No  results for input(s): "AMMONIA" in the last 168 hours. Coagulation profile No results for input(s): "INR", "PROTIME" in the last 168 hours.  CBC: Recent Labs  Lab 05/05/23 1201  WBC 5.2  NEUTROABS 3.5  HGB 10.8*  HCT 31.8*  MCV 97.5  PLT 352   Cardiac Enzymes: No results for input(s): "CKTOTAL", "CKMB", "CKMBINDEX", "TROPONINI" in the last 168 hours. BNP (last 3 results) No results for input(s): "PROBNP" in the last 8760 hours. CBG: Recent Labs  Lab 05/05/23 2245 05/06/23 0735  GLUCAP 173* 124*   D-Dimer: No results for input(s): "DDIMER" in the last 72 hours. Hgb A1c: No results for input(s): "HGBA1C" in the last 72 hours. Lipid Profile: No results for input(s): "CHOL", "HDL", "LDLCALC", "TRIG", "CHOLHDL", "LDLDIRECT" in the last 72 hours. Thyroid function studies: No results for input(s): "TSH", "T4TOTAL", "T3FREE", "THYROIDAB" in the last 72 hours.  Invalid input(s): "FREET3" Anemia work up: No results for input(s): "VITAMINB12", "FOLATE", "FERRITIN", "TIBC", "IRON", "RETICCTPCT" in the last 72 hours. Sepsis Labs: Recent Labs  Lab 05/05/23 1201  WBC 5.2  LATICACIDVEN 1.6    Microbiology Recent Results (from the past 240 hour(s))  Aerobic/Anaerobic Culture w Gram Stain (surgical/deep wound)     Status: None (Preliminary result)   Collection Time: 05/03/23  9:59 AM   Specimen: Wound  Result Value Ref Range Status   Specimen Description   Final    WOUND Performed at [facility], [address], [place], [place] [address]    Special Requests   Final    NONE Performed at [facility] [address] [place], [place] [address]    Gram Stain   Final    RARE WBC PRESENT, PREDOMINANTLY PMN NO ORGANISMS SEEN Performed at [facility],  [address] [place], [place] [address]    Culture   Final    MODERATE DIPHTHEROIDS(CORYNEBACTERIUM SPECIES) Standardized susceptibility testing for this organism is not available. RARE PSEUDOMONAS AERUGINOSA SUSCEPTIBILITIES TO FOLLOW NO ANAEROBES ISOLATED; CULTURE IN PROGRESS FOR 5 DAYS    Report Status PENDING  Incomplete  Culture, blood (Routine X 2) w Reflex to ID Panel     Status: None (Preliminary result)   Collection Time: 05/05/23  4:38 PM   Specimen: BLOOD LEFT ARM  Result Value Ref Range Status   Specimen Description BLOOD LEFT ARM  Final   Special Requests   Final    BOTTLES DRAWN AEROBIC AND ANAEROBIC Blood Culture results may not be optimal due to an excessive volume of blood received in culture bottles   Culture   Final    NO GROWTH < 12 HOURS Performed at [facility], [address], [place], [place] [address]    Report Status PENDING  Incomplete  Culture, blood (Routine X 2) w Reflex to ID Panel     Status: None (Preliminary result)   Collection Time: 05/05/23  4:38 PM   Specimen: BLOOD RIGHT ARM  Result Value Ref Range Status   Specimen Description BLOOD RIGHT ARM  Final   Special Requests   Final    BOTTLES DRAWN AEROBIC AND ANAEROBIC Blood Culture results may not be optimal due to an excessive volume of blood received in culture bottles   Culture   Final    NO GROWTH < 12 HOURS Performed at [facility], [address], [place], [place] [address]    Report Status PENDING  Incomplete    Procedures and diagnostic studies:  MR FOOT LEFT WO CONTRAST  Result Date: 05/06/2023 CLINICAL DATA:  Left fifth toe infection. EXAM: MRI OF THE LEFT FOOT WITHOUT CONTRAST TECHNIQUE: Multiplanar, multisequence MR imaging of the left foot was performed. No intravenous contrast was administered. COMPARISON:  Left foot x-rays dated June 04, 2013. FINDINGS: Despite efforts by the technologist and patient, motion artifact is present on today's exam and  could not be eliminated. This reduces exam sensitivity and specificity. Bones/Joint/Cartilage Prominent marrow edema with corresponding decreased T1 marrow signal involving the majority the fifth metatarsal, sparing the base, in the entire fifth proximal phalanx. There is early bone destruction of the fifth metatarsal head and fifth proximal phalanx base. Large fifth MTP joint. Dorsal subluxation of the fifth proximal phalanx with respect to the metatarsal head. No acute fracture. Moderate midfoot and mild first MTP joint osteoarthritis. Ligaments The fifth MTP joint collateral ligaments are torn. Remaining toe collateral ligaments are intact. Muscles and Tendons No tenosynovitis. Near complete atrophy of the intrinsic foot muscles. Soft tissue Small ulcer at the plantar lateral aspect of the fifth metatarsal head with sinus tract extending to bone (series 4, image 30). No soft tissue mass. IMPRESSION: 1. Small ulcer at the plantar lateral aspect of the fifth metatarsal head with sinus tract extending to bone. 2. Septic arthritis of the fifth MTP joint with osteomyelitis of the majority of the fifth metatarsal and entire fifth proximal phalanx. Electronically Signed   By: Obie Dredge M.D.   On: 05/06/2023 09:30               LOS: 1 day   Jennylee Uehara  [facility]   Pager on [email or web address]. If 7PM-7AM, please contact night-coverage at [email or web address]     05/06/2023, 11:32 AM

## 2023-05-06 NOTE — ED Notes (Signed)
Advised nurse that patient has ready bed 

## 2023-05-06 NOTE — ED Notes (Signed)
Pt ambulated to bathroom and back independently.  Gait steady.

## 2023-05-07 ENCOUNTER — Inpatient Hospital Stay: Payer: 59

## 2023-05-07 ENCOUNTER — Encounter (HOSPITAL_COMMUNITY): Payer: Self-pay

## 2023-05-07 ENCOUNTER — Encounter
Admission: EM | Disposition: A | Discharge status: Home / Self Care | Payer: Self-pay | Source: Home / Self Care | Attending: Internal Medicine

## 2023-05-07 DIAGNOSIS — I70245 Atherosclerosis of native arteries of left leg with ulceration of other part of foot: Secondary | ICD-10-CM

## 2023-05-07 DIAGNOSIS — L97529 Non-pressure chronic ulcer of other part of left foot with unspecified severity: Secondary | ICD-10-CM

## 2023-05-07 DIAGNOSIS — M869 Osteomyelitis, unspecified: Secondary | ICD-10-CM | POA: Diagnosis not present

## 2023-05-07 DIAGNOSIS — M009 Pyogenic arthritis, unspecified: Secondary | ICD-10-CM | POA: Diagnosis not present

## 2023-05-07 HISTORY — PX: LOWER EXTREMITY ANGIOGRAPHY: CATH118251

## 2023-05-07 LAB — BASIC METABOLIC PANEL
Anion gap: 13 (ref 5–15)
BUN: 8 mg/dL (ref 8–23)
CO2: 30 mmol/L (ref 22–32)
Calcium: 8.9 mg/dL (ref 8.9–10.3)
Chloride: 88 mmol/L — ABNORMAL LOW (ref 98–111)
Creatinine, Ser: 0.9 mg/dL (ref 0.61–1.24)
GFR, Estimated: >60 mL/min (ref >60)
Glucose, Bld: 178 mg/dL — ABNORMAL HIGH (ref 70–99)
Potassium: 3.4 mmol/L — ABNORMAL LOW (ref 3.5–5.1)
Sodium: 131 mmol/L — ABNORMAL LOW (ref 135–145)

## 2023-05-07 LAB — MAGNESIUM: Magnesium: 1.4 mg/dL — ABNORMAL LOW (ref 1.7–2.4)

## 2023-05-07 LAB — PHOSPHORUS: Phosphorus: 3.7 mg/dL (ref 2.5–4.6)

## 2023-05-07 LAB — GLUCOSE, CAPILLARY
Glucose-Capillary: 165 mg/dL — ABNORMAL HIGH (ref 70–99)
Glucose-Capillary: 177 mg/dL — ABNORMAL HIGH (ref 70–99)
Glucose-Capillary: 188 mg/dL — ABNORMAL HIGH (ref 70–99)
Glucose-Capillary: 268 mg/dL — ABNORMAL HIGH (ref 70–99)
Glucose-Capillary: 220 mg/dL — ABNORMAL HIGH (ref 70–99)

## 2023-05-07 SURGERY — LOWER EXTREMITY ANGIOGRAPHY
Anesthesia: Moderate Sedation | Laterality: Left

## 2023-05-07 MED ORDER — HEPARIN (PORCINE) IN NACL 1000-0.9 UT/500ML-% IV SOLN
INTRAVENOUS | Status: DC | PRN
  Administered 2023-05-07: 1000 mL

## 2023-05-07 MED ORDER — MIDAZOLAM HCL 2 MG/ML PO SYRP
8.0000 mg | ORAL_SOLUTION | Freq: Once | ORAL | Status: DC | PRN
  Filled 2023-05-07: qty 5

## 2023-05-07 MED ORDER — IODIXANOL 320 MG/ML IV SOLN
INTRAVENOUS | Status: DC | PRN
  Administered 2023-05-07: 40 mL via INTRA_ARTERIAL

## 2023-05-07 MED ORDER — SODIUM CHLORIDE 0.9 % IV SOLN: INTRAVENOUS | Status: DC

## 2023-05-07 MED ORDER — HEPARIN SODIUM (PORCINE) 1000 UNIT/ML IJ SOLN
INTRAMUSCULAR | Status: AC
  Filled 2023-05-07: qty 10

## 2023-05-07 MED ORDER — CEFAZOLIN SODIUM-DEXTROSE 2-4 GM/100ML-% IV SOLN: 2.0000 g | INTRAVENOUS | Status: DC

## 2023-05-07 MED ORDER — POTASSIUM CHLORIDE CRYS ER 20 MEQ PO TBCR
40.0000 meq | EXTENDED_RELEASE_TABLET | Freq: Once | ORAL | Status: AC
  Administered 2023-05-07: 40 meq via ORAL
  Filled 2023-05-07: qty 2

## 2023-05-07 MED ORDER — NITROGLYCERIN 1 MG/10 ML FOR IR/CATH LAB
INTRA_ARTERIAL | Status: DC | PRN
  Administered 2023-05-07: 200 ug via INTRA_ARTERIAL
  Administered 2023-05-07: 300 ug via INTRA_ARTERIAL

## 2023-05-07 MED ORDER — METHYLPREDNISOLONE SODIUM SUCC 125 MG IJ SOLR: 125.0000 mg | Freq: Once | INTRAMUSCULAR | Status: DC | PRN

## 2023-05-07 MED ORDER — DIPHENHYDRAMINE HCL 50 MG/ML IJ SOLN: 50.0000 mg | Freq: Once | INTRAMUSCULAR | Status: DC | PRN

## 2023-05-07 MED ORDER — FENTANYL CITRATE (PF) 100 MCG/2ML IJ SOLN
INTRAMUSCULAR | Status: DC | PRN
  Administered 2023-05-07: 50 ug via INTRAVENOUS

## 2023-05-07 MED ORDER — ZINC SULFATE 220 (50 ZN) MG PO CAPS
220.0000 mg | ORAL_CAPSULE | Freq: Every day | ORAL | Status: DC
  Administered 2023-05-08 – 2023-05-10 (×3): 220 mg via ORAL
  Filled 2023-05-07 (×3): qty 1

## 2023-05-07 MED ORDER — FENTANYL CITRATE (PF) 100 MCG/2ML IJ SOLN
INTRAMUSCULAR | Status: AC
  Filled 2023-05-07: qty 2

## 2023-05-07 MED ORDER — HYDROMORPHONE HCL 1 MG/ML IJ SOLN: 1.0000 mg | Freq: Once | INTRAMUSCULAR | Status: DC | PRN

## 2023-05-07 MED ORDER — NITROGLYCERIN 1 MG/10 ML FOR IR/CATH LAB
INTRA_ARTERIAL | Status: AC
  Filled 2023-05-07: qty 10

## 2023-05-07 MED ORDER — MIDAZOLAM HCL 2 MG/2ML IJ SOLN
INTRAMUSCULAR | Status: DC | PRN
  Administered 2023-05-07: 2 mg via INTRAVENOUS

## 2023-05-07 MED ORDER — FENTANYL CITRATE PF 50 MCG/ML IJ SOSY: 12.5000 ug | PREFILLED_SYRINGE | Freq: Once | INTRAMUSCULAR | Status: DC | PRN

## 2023-05-07 MED ORDER — HEPARIN SODIUM (PORCINE) 1000 UNIT/ML IJ SOLN
INTRAMUSCULAR | Status: DC | PRN
  Administered 2023-05-07: 4000 [IU] via INTRAVENOUS

## 2023-05-07 MED ORDER — ENSURE ENLIVE PO LIQD: 237.0000 mL | Freq: Every day | ORAL | Status: DC

## 2023-05-07 MED ORDER — LIDOCAINE-EPINEPHRINE (PF) 1 %-1:200000 IJ SOLN
INTRAMUSCULAR | Status: DC | PRN
  Administered 2023-05-07: 10 mL via INTRADERMAL

## 2023-05-07 MED ORDER — MAGNESIUM SULFATE 2 GM/50ML IV SOLN
2.0000 g | Freq: Once | INTRAVENOUS | Status: AC
  Administered 2023-05-07: 2 g via INTRAVENOUS
  Filled 2023-05-07: qty 50

## 2023-05-07 MED ORDER — JUVEN PO PACK
1.0000 | PACK | Freq: Two times a day (BID) | ORAL | Status: DC
  Administered 2023-05-09 – 2023-05-10 (×3): 1 via ORAL

## 2023-05-07 MED ORDER — FAMOTIDINE 20 MG PO TABS: 40.0000 mg | ORAL_TABLET | Freq: Once | ORAL | Status: DC | PRN

## 2023-05-07 MED ORDER — VITAMIN C 500 MG PO TABS
500.0000 mg | ORAL_TABLET | Freq: Two times a day (BID) | ORAL | Status: DC
  Administered 2023-05-08 – 2023-05-10 (×5): 500 mg via ORAL
  Filled 2023-05-07 (×6): qty 1

## 2023-05-07 MED ORDER — ONDANSETRON HCL 4 MG/2ML IJ SOLN: 4.0000 mg | Freq: Four times a day (QID) | INTRAMUSCULAR | Status: DC | PRN

## 2023-05-07 MED ORDER — MIDAZOLAM HCL 5 MG/5ML IJ SOLN
INTRAMUSCULAR | Status: AC
  Filled 2023-05-07: qty 5

## 2023-05-07 SURGICAL SUPPLY — 19 items
BALLN LUTONIX 018 4X150X130 (BALLOONS) ×1
BALLN ULTRVRSE 3X220X150 (BALLOONS) ×1
BALLOON LUTONIX 018 4X150X130 (BALLOONS) IMPLANT
BALLOON ULTRVRSE 3X220X150 (BALLOONS) IMPLANT
CATH ANGIO 5F PIGTAIL 65CM (CATHETERS) IMPLANT
CATH TEMPO 5F RIM 65CM (CATHETERS) IMPLANT
CATH VERT 5X100 (CATHETERS) IMPLANT
COVER PROBE ULTRASOUND 5X96 (MISCELLANEOUS) IMPLANT
DEVICE STARCLOSE SE CLOSURE (Vascular Products) IMPLANT
GLIDEWIRE ADV .035X260CM (WIRE) IMPLANT
KIT ENCORE 26 ADVANTAGE (KITS) IMPLANT
PACK ANGIOGRAPHY (CUSTOM PROCEDURE TRAY) ×1 IMPLANT
SHEATH BRITE TIP 5FRX11 (SHEATH) IMPLANT
SHEATH RAABE 6FRX70 (SHEATH) IMPLANT
SYR MEDRAD MARK 7 150ML (SYRINGE) IMPLANT
TUBING CONTRAST HIGH PRESS 72 (TUBING) IMPLANT
WIRE G V18X300CM (WIRE) IMPLANT
WIRE GUIDERIGHT .035X150 (WIRE) IMPLANT
WIRE SUPRACORE 300CM (WIRE) IMPLANT

## 2023-05-07 NOTE — Progress Notes (Signed)
Initial Nutrition Assessment  DOCUMENTATION CODES:   Not applicable  INTERVENTION:   -Once diet is advanced, add:  -MVI with minerals daily -1 mg folic acid daily -100 mg thiamine daily -1 packet Juven BID, each packet provides 95 calories, 2.5 grams of protein (collagen), and 9.8 grams of carbohydrate (3 grams sugar); also contains 7 grams of L-arginine and L-glutamine, 300 mg vitamin C, 15 mg vitamin E, 1.2 mcg vitamin B-12, 9.5 mg zinc, 200 mg calcium, and 1.5 g  Calcium Beta-hydroxy-Beta-methylbutyrate to support wound healing  -Ensure Max po daily, each supplement provides 150 kcal and 30 grams of protein -Double protein portions with meals -500 mg vitamin C daily -220 mg zinc sulfate daily x 14 days  NUTRITION DIAGNOSIS:   Increased nutrient needs related to wound healing as evidenced by estimated needs.  GOAL:   Patient will meet greater than or equal to 90% of their needs  MONITOR:   PO intake, Supplement acceptance, Diet advancement  REASON FOR ASSESSMENT:   Consult Assessment of nutrition requirement/status, Wound healing  ASSESSMENT:   Pt with medical history significant of type 2 diabetes, gout, hyperlipidemia, hypertension, symptomatic anemia, alcohol use presenting with left fifth toe septic joint versus osteomyelitis.  Pt admitted with lt fifth toe redness concerning for osteomyelitis vs septic joint.   Reviewed I/O's: +1.6 L x 24 hours  Per podiatry notes, MRI revealed osteomyelitis of lt fifth MTPJ. Vascular exam is reassuring, but may need 5th ray amputation.   Pt NPO for vascular testing today.   Spoke with pt at bedside, who had flat affect today. He complains of overall feeling unwell secondary to foot pain. Pt shares that he has been followed as an outpatient buy podiatry for lt foot. He complains of having a decreased appetite secondary to being on antibiotics over the past month, but intake has picked up since hospitalization. Noted meal  completions 50%.   Pt denies any weight loss. Reviewed wt hx; pt has experienced a 2.9% wt loss over the past 3 months, which is not significant for time frame.   Discussed importance of good meal and supplement intake to promote healing.   Noted pt with ETOH abuse, consumes half of a fifth to a fifth of liquor daily. Pt shares that he has had falls from home. He denies that this is related to his foot, but "because I wasn't paying attention to where I was going".   Medications reviewed and include folic acid, MVI, and thiamine.   Lab Results  Component Value Date   HGBA1C 5.9 (H) 12/20/2022   PTA DM medications are 15 units insulin glargine daily and 1000 mg metformin BID. Pt reports compliance with DM regimen, however, states his latest Hgb A1c was 6.8. Discussed importance of good glycemic control as it related to wound healing.   Labs reviewed: Na: 131, K: 3.4, CBGS: 124-181 (inpatient orders for glycemic control are 0-15 units insulin aspart TID with meals, 0-5 units insulin aspart daily at bedtime, and 10 units insulin glargine-yfgn daily).    NUTRITION - FOCUSED PHYSICAL EXAM:  Flowsheet Row Most Recent Value  Orbital Region No depletion  Upper Arm Region No depletion  Thoracic and Lumbar Region No depletion  Buccal Region No depletion  Temple Region No depletion  Clavicle Bone Region No depletion  Clavicle and Acromion Bone Region No depletion  Scapular Bone Region No depletion  Dorsal Hand No depletion  Patellar Region No depletion  Anterior Thigh Region No depletion  Posterior Calf Region  No depletion  Edema (RD Assessment) Mild  Hair Reviewed  Eyes Reviewed  Mouth Reviewed  Skin Reviewed  Nails Reviewed       Diet Order:   Diet Order             Diet NPO time specified Except for: Sips with Meds  Diet effective now                   EDUCATION NEEDS:   Education needs have been addressed  Skin:  Skin Assessment: Skin Integrity Issues: Skin  Integrity Issues:: Diabetic Ulcer Diabetic Ulcer: lt foot  Last BM:  05/05/23  Height:   Ht Readings from Last 1 Encounters:  05/05/23 5\' 11"  (1.803 m)    Weight:   Wt Readings from Last 1 Encounters:  05/05/23 97.5 kg    Ideal Body Weight:  78.2 kg  BMI:  Body mass index is 29.99 kg/m.  Estimated Nutritional Needs:   Kcal:  2150-2350  Protein:  115-130 grams  Fluid:  > 2 L    Levada Schilling, RD, LDN, CDCES Registered Dietitian III Certified Diabetes Care and Education Specialist Please refer to [facility] for RD and/or RD on-call/weekend/after hours pager

## 2023-05-07 NOTE — Progress Notes (Signed)
Day of Surgery   Subjective/Chief Complaint: Patient seen.  No specific complaints.   Objective: Vital signs in last 24 hours: Temp:  [97.8 F (36.6 C)-98.5 F (36.9 C)] 97.8 F (36.6 C) (10/14 0731) Pulse Rate:  [79-89] 83 (10/14 0731) Resp:  [16-18] 16 (10/14 0731) BP: (150-173)/(79-95) 159/86 (10/14 0731) SpO2:  [94 %-98 %] 95 % (10/14 0731) Last BM Date : 05/05/23  Intake/Output from previous day: 10/13 0701 - 10/14 0700 In: 1591 [P.O.:480; IV Piggyback:1111] Out: -  Intake/Output this shift: No intake/output data recorded.  No bandaging noted on the left foot.  No signs of any significant drainage.  No expressible fluid.  Lab Results:  Recent Labs    05/05/23 1201  WBC 5.2  HGB 10.8*  HCT 31.8*  PLT 352   BMET Recent Labs    05/05/23 1201 05/07/23 0442  NA 127* 131*  K 3.3* 3.4*  CL 83* 88*  CO2 29 30  GLUCOSE 256* 178*  BUN 10 8  CREATININE 0.92 0.90  CALCIUM 9.0 8.9   PT/INR No results for input(s): "LABPROT", "INR" in the last 72 hours. ABG No results for input(s): "PHART", "HCO3" in the last 72 hours.  Invalid input(s): "PCO2", "PO2"  Studies/Results: US ARTERIAL ABI (SCREENING LOWER EXTREMITY)  Result Date: 05/07/2023 CLINICAL DATA:  Peripheral arterial disease Osteomyelitis EXAM: NONINVASIVE PHYSIOLOGIC VASCULAR STUDY OF BILATERAL LOWER EXTREMITIES TECHNIQUE: Evaluation of both lower extremities were performed at rest, including calculation of ankle-brachial indices with single level pressure measurements and doppler recording. COMPARISON:  None available. FINDINGS: Right ABI:  1.18 Left ABI:  1.21 Right Lower Extremity:  Normal arterial waveforms at the ankle. Left Lower Extremity:  Normal arterial waveforms at the ankle. 1.0-1.4 Normal IMPRESSION: Normal ankle-brachial indices Electronically Signed   By: Mauri Reading  Mir M.D.   On: 05/07/2023 11:32   MR FOOT LEFT WO CONTRAST  Result Date: 05/06/2023 CLINICAL DATA:  Left fifth toe infection.  EXAM: MRI OF THE LEFT FOOT WITHOUT CONTRAST TECHNIQUE: Multiplanar, multisequence MR imaging of the left foot was performed. No intravenous contrast was administered. COMPARISON:  Left foot x-rays dated June 04, 2013. FINDINGS: Despite efforts by the technologist and patient, motion artifact is present on today's exam and could not be eliminated. This reduces exam sensitivity and specificity. Bones/Joint/Cartilage Prominent marrow edema with corresponding decreased T1 marrow signal involving the majority the fifth metatarsal, sparing the base, in the entire fifth proximal phalanx. There is early bone destruction of the fifth metatarsal head and fifth proximal phalanx base. Large fifth MTP joint. Dorsal subluxation of the fifth proximal phalanx with respect to the metatarsal head. No acute fracture. Moderate midfoot and mild first MTP joint osteoarthritis. Ligaments The fifth MTP joint collateral ligaments are torn. Remaining toe collateral ligaments are intact. Muscles and Tendons No tenosynovitis. Near complete atrophy of the intrinsic foot muscles. Soft tissue Small ulcer at the plantar lateral aspect of the fifth metatarsal head with sinus tract extending to bone (series 4, image 30). No soft tissue mass. IMPRESSION: 1. Small ulcer at the plantar lateral aspect of the fifth metatarsal head with sinus tract extending to bone. 2. Septic arthritis of the fifth MTP joint with osteomyelitis of the majority of the fifth metatarsal and entire fifth proximal phalanx. Electronically Signed   By: Obie Dredge M.D.   On: 05/06/2023 09:30    Anti-infectives: Anti-infectives (From admission, onward)    Start     Dose/Rate Route Frequency Ordered Stop   05/07/23 1154  ceFAZolin (  ANCEF) IVPB 2g/100 mL premix        2 g 200 mL/hr over 30 Minutes Intravenous 30 min pre-op 05/07/23 1154     05/06/23 1000  [MAR Hold]  cefTRIAXone (ROCEPHIN) 2 g in sodium chloride 0.9 % 100 mL IVPB        (MAR Hold since Mon  05/07/2023 at 1232.Hold Reason: Transfer to a Procedural area)  Placed in "And" Linked Group   2 g 200 mL/hr over 30 Minutes Intravenous Every 24 hours 05/05/23 1545 05/13/23 0959   05/06/23 0500  [MAR Hold]  vancomycin (VANCOREADY) IVPB 1250 mg/250 mL        (MAR Hold since Mon 05/07/2023 at 1232.Hold Reason: Transfer to a Procedural area)   1,250 mg 166.7 mL/hr over 90 Minutes Intravenous Every 12 hours 05/05/23 1554     05/05/23 1600  [MAR Hold]  metroNIDAZOLE (FLAGYL) IVPB 500 mg        (MAR Hold since Mon 05/07/2023 at 1232.Hold Reason: Transfer to a Procedural area)  Placed in "And" Linked Group   500 mg 100 mL/hr over 60 Minutes Intravenous Every 8 hours 05/05/23 1545 05/12/23 1544   05/05/23 1445  vancomycin (VANCOCIN) injection 1,950 mg  Status:  Discontinued        20 mg/kg  97.5 kg Intravenous  Once 05/05/23 1439 05/05/23 1441   05/05/23 1445  cefTRIAXone (ROCEPHIN) 2 g in sodium chloride 0.9 % 100 mL IVPB        2 g 200 mL/hr over 30 Minutes Intravenous  Once 05/05/23 1439 05/05/23 1554   05/05/23 1445  vancomycin (VANCOREADY) IVPB 2000 mg/400 mL        2,000 mg 200 mL/hr over 120 Minutes Intravenous  Once 05/05/23 1441 05/05/23 1913       Assessment/Plan: s/p Procedure(s): Lower Extremity Angiography (Left) Assessment: Septic joint with osteomyelitis left foot.  Plan: Light dressing applied to the left foot.  Discussed with the patient the need for amputation of the fifth toe with partial ray resection.  Discussed possible risks and complications of the procedure including but not limited to mostly inability of wound to heal due to his diabetes, vascular status, or continued infection.  Could experience some phantom pain but he has significant neuropathy.  Questions invited and answered.  No guarantees could be given as to the outcome.  We will obtain consent for amputation fifth toe with partial ray resection.  Patient will be n.p.o. after midnight.  Plan for surgery  tomorrow around noon  LOS: 2 days    Ricci Barker 05/07/2023

## 2023-05-07 NOTE — Progress Notes (Signed)
Progress Note    05/07/2023 8:39 AM * No surgery date entered *  Subjective:  [name] is a 67 yo male with medical history significant for type 2 diabetes mellitus, hypertension, hyperlipidemia, gout, chronic anemia, alcohol use disorder, psoriasis, Schatzki's ring, arthritis, neuropathy, who was referred from Dr. Dory Larsen office (podiatrist) because of left foot infection. He said he has had a wound on both feet. However, he has been dealing with infection of the left foot for some time now and has been on oral antibiotics on and off for about 4 weeks. He reported drainage from the left foot wound and noticed increasing redness. He also complained of nausea and fatigue. Workup revealed patient has osteomyelitis of the left fifth MTP joint with septic arthritis. Vascular Surgery consulted to evaluate for possible surgery by podiatry.    Vitals:   05/06/23 2345 05/07/23 0731  BP: (!) 173/95 (!) 159/86  Pulse: 89 83  Resp: 18 16  Temp: 98.3 F (36.8 C) 97.8 F (36.6 C)  SpO2: 94% 95%   Physical Exam: Cardiac:  RRR, Normal S1, S2. No Murmurs.  Lungs:  Lungs are clear on auscultation, No rales, Rhonchi or wheezing.  Incisions:  None Extremities:  Left Lower Extremity warm to touch. Unable to palpate DP/PT pulses. Non Healing sore on lateral left foot.  Abdomen:  Positive bowel sounds throughout. Soft and non tender and non distended.  Neurologic: AAOX4, answers questions and follows commands appropriately.   CBC    Component Value Date/Time   WBC 5.2 05/05/2023 1201   RBC 3.26 (L) 05/05/2023 1201   HGB 10.8 (L) 05/05/2023 1201   HGB 11.7 (L) 12/20/2022 1004   HCT 31.8 (L) 05/05/2023 1201   HCT 33.9 (L) 12/20/2022 1004   PLT 352 05/05/2023 1201   PLT 239 12/20/2022 1004   MCV 97.5 05/05/2023 1201   MCV 99 (H) 12/20/2022 1004   MCH 33.1 05/05/2023 1201   MCHC 34.0 05/05/2023 1201   RDW 13.2 05/05/2023 1201   RDW 13.0 12/20/2022 1004   LYMPHSABS 1.2 05/05/2023 1201    LYMPHSABS 1.5 03/31/2021 0707   MONOABS 0.4 05/05/2023 1201   EOSABS 0.2 05/05/2023 1201   EOSABS 0.3 03/31/2021 0707   BASOSABS 0.0 05/05/2023 1201   BASOSABS 0.1 03/31/2021 0707    BMET    Component Value Date/Time   NA 131 (L) 05/07/2023 0442   NA 131 (L) 12/20/2022 1004   K 3.4 (L) 05/07/2023 0442   CL 88 (L) 05/07/2023 0442   CO2 30 05/07/2023 0442   GLUCOSE 178 (H) 05/07/2023 0442   BUN 8 05/07/2023 0442   BUN 10 12/20/2022 1004   CREATININE 0.90 05/07/2023 0442   CREATININE 1.23 01/03/2013 1212   CALCIUM 8.9 05/07/2023 0442   GFRNONAA >60 05/07/2023 0442   GFRNONAA >60 01/03/2013 1212   GFRAA 95 03/25/2020 0755   GFRAA >60 01/03/2013 1212    INR No results found for: "INR"   Intake/Output Summary (Last 24 hours) at 05/07/2023 0839 Last data filed at 05/07/2023 0610 Gross per 24 hour  Intake 1590.97 ml  Output --  Net 1590.97 ml     Assessment/Plan:  67 y.o. male presents to [facility] ER for Left Lower Extremity osteomyelitis.   * No surgery date entered *   PLAN: Vascular surgery plans on taking the patient to the vascular lab today 05/07/2023 for left lower extremity angiogram with possible intervention for his left left fifth digit metatarsal osteomyelitis and nonhealing ulcer.  I  discussed in detail with the patient the procedure, benefits, risk, and complications.  Patient verbalizes understanding.  He wishes to proceed with the procedure soon as possible.  I answered all the patient's questions this morning.  Patient has been n.p.o. since midnight for the procedure.  I discussed in detail the plan with Dr. Festus Barren MD and he agrees with the plan.  DVT prophylaxis: Lovenox 40 mg every 24 hours   Marcie [facility] 05/07/2023 8:39 AM

## 2023-05-07 NOTE — H&P (View-Only) (Signed)
Day of Surgery   Subjective/Chief Complaint: Patient seen.  No specific complaints.   Objective: Vital signs in last 24 hours: Temp:  [97.8 F (36.6 C)-98.5 F (36.9 C)] 97.8 F (36.6 C) (10/14 0731) Pulse Rate:  [79-89] 83 (10/14 0731) Resp:  [16-18] 16 (10/14 0731) BP: (150-173)/(79-95) 159/86 (10/14 0731) SpO2:  [94 %-98 %] 95 % (10/14 0731) Last BM Date : 05/05/23  Intake/Output from previous day: 10/13 0701 - 10/14 0700 In: 1591 [P.O.:480; IV Piggyback:1111] Out: -  Intake/Output this shift: No intake/output data recorded.  No bandaging noted on the left foot.  No signs of any significant drainage.  No expressible fluid.  Lab Results:  Recent Labs    05/05/23 1201  WBC 5.2  HGB 10.8*  HCT 31.8*  PLT 352   BMET Recent Labs    05/05/23 1201 05/07/23 0442  NA 127* 131*  K 3.3* 3.4*  CL 83* 88*  CO2 29 30  GLUCOSE 256* 178*  BUN 10 8  CREATININE 0.92 0.90  CALCIUM 9.0 8.9   PT/INR No results for input(s): "LABPROT", "INR" in the last 72 hours. ABG No results for input(s): "PHART", "HCO3" in the last 72 hours.  Invalid input(s): "PCO2", "PO2"  Studies/Results: US ARTERIAL ABI (SCREENING LOWER EXTREMITY)  Result Date: 05/07/2023 CLINICAL DATA:  Peripheral arterial disease Osteomyelitis EXAM: NONINVASIVE PHYSIOLOGIC VASCULAR STUDY OF BILATERAL LOWER EXTREMITIES TECHNIQUE: Evaluation of both lower extremities were performed at rest, including calculation of ankle-brachial indices with single level pressure measurements and doppler recording. COMPARISON:  None available. FINDINGS: Right ABI:  1.18 Left ABI:  1.21 Right Lower Extremity:  Normal arterial waveforms at the ankle. Left Lower Extremity:  Normal arterial waveforms at the ankle. 1.0-1.4 Normal IMPRESSION: Normal ankle-brachial indices Electronically Signed   By: Mauri Reading  Mir M.D.   On: 05/07/2023 11:32   MR FOOT LEFT WO CONTRAST  Result Date: 05/06/2023 CLINICAL DATA:  Left fifth toe infection.  EXAM: MRI OF THE LEFT FOOT WITHOUT CONTRAST TECHNIQUE: Multiplanar, multisequence MR imaging of the left foot was performed. No intravenous contrast was administered. COMPARISON:  Left foot x-rays dated June 04, 2013. FINDINGS: Despite efforts by the technologist and patient, motion artifact is present on today's exam and could not be eliminated. This reduces exam sensitivity and specificity. Bones/Joint/Cartilage Prominent marrow edema with corresponding decreased T1 marrow signal involving the majority the fifth metatarsal, sparing the base, in the entire fifth proximal phalanx. There is early bone destruction of the fifth metatarsal head and fifth proximal phalanx base. Large fifth MTP joint. Dorsal subluxation of the fifth proximal phalanx with respect to the metatarsal head. No acute fracture. Moderate midfoot and mild first MTP joint osteoarthritis. Ligaments The fifth MTP joint collateral ligaments are torn. Remaining toe collateral ligaments are intact. Muscles and Tendons No tenosynovitis. Near complete atrophy of the intrinsic foot muscles. Soft tissue Small ulcer at the plantar lateral aspect of the fifth metatarsal head with sinus tract extending to bone (series 4, image 30). No soft tissue mass. IMPRESSION: 1. Small ulcer at the plantar lateral aspect of the fifth metatarsal head with sinus tract extending to bone. 2. Septic arthritis of the fifth MTP joint with osteomyelitis of the majority of the fifth metatarsal and entire fifth proximal phalanx. Electronically Signed   By: Obie Dredge M.D.   On: 05/06/2023 09:30    Anti-infectives: Anti-infectives (From admission, onward)    Start     Dose/Rate Route Frequency Ordered Stop   05/07/23 1154  ceFAZolin (  ANCEF) IVPB 2g/100 mL premix        2 g 200 mL/hr over 30 Minutes Intravenous 30 min pre-op 05/07/23 1154     05/06/23 1000  [MAR Hold]  cefTRIAXone (ROCEPHIN) 2 g in sodium chloride 0.9 % 100 mL IVPB        (MAR Hold since Mon  05/07/2023 at 1232.Hold Reason: Transfer to a Procedural area)  Placed in "And" Linked Group   2 g 200 mL/hr over 30 Minutes Intravenous Every 24 hours 05/05/23 1545 05/13/23 0959   05/06/23 0500  [MAR Hold]  vancomycin (VANCOREADY) IVPB 1250 mg/250 mL        (MAR Hold since Mon 05/07/2023 at 1232.Hold Reason: Transfer to a Procedural area)   1,250 mg 166.7 mL/hr over 90 Minutes Intravenous Every 12 hours 05/05/23 1554     05/05/23 1600  [MAR Hold]  metroNIDAZOLE (FLAGYL) IVPB 500 mg        (MAR Hold since Mon 05/07/2023 at 1232.Hold Reason: Transfer to a Procedural area)  Placed in "And" Linked Group   500 mg 100 mL/hr over 60 Minutes Intravenous Every 8 hours 05/05/23 1545 05/12/23 1544   05/05/23 1445  vancomycin (VANCOCIN) injection 1,950 mg  Status:  Discontinued        20 mg/kg  97.5 kg Intravenous  Once 05/05/23 1439 05/05/23 1441   05/05/23 1445  cefTRIAXone (ROCEPHIN) 2 g in sodium chloride 0.9 % 100 mL IVPB        2 g 200 mL/hr over 30 Minutes Intravenous  Once 05/05/23 1439 05/05/23 1554   05/05/23 1445  vancomycin (VANCOREADY) IVPB 2000 mg/400 mL        2,000 mg 200 mL/hr over 120 Minutes Intravenous  Once 05/05/23 1441 05/05/23 1913       Assessment/Plan: s/p Procedure(s): Lower Extremity Angiography (Left) Assessment: Septic joint with osteomyelitis left foot.  Plan: Light dressing applied to the left foot.  Discussed with the patient the need for amputation of the fifth toe with partial ray resection.  Discussed possible risks and complications of the procedure including but not limited to mostly inability of wound to heal due to his diabetes, vascular status, or continued infection.  Could experience some phantom pain but he has significant neuropathy.  Questions invited and answered.  No guarantees could be given as to the outcome.  We will obtain consent for amputation fifth toe with partial ray resection.  Patient will be n.p.o. after midnight.  Plan for surgery  tomorrow around noon  LOS: 2 days    Ricci Barker 05/07/2023

## 2023-05-07 NOTE — Consult Note (Signed)
Pharmacy Antibiotic Note  [name] is a 67 y.o. male admitted on 05/05/2023 with DFI with concern for osteomyelitis.  Pharmacy has been consulted for vancomycin dosing.  Low extremity angio scheduled for today 10/14 at 1200, Scr remains stable at 0.90 this AM.  Plan: Day#3 of Abx ; 4 doses of Vancomycin received Continue vancomycin 1250 mg IV every 12 hours Estimated AUC 486, Cmin 14.1 IBW, Scr 0.92, Vd 0.72 Vancomycin levels tomorrow AM , prior to 6th dose Ceftriaxone 2 grams IV every 24 hours per provider Flagyl 500 mg IV every 12 hours per provider Follow renal function for dose adjustments   Height: 5\' 11"  (180.3 cm) Weight: 97.5 kg (215 lb) IBW/kg (Calculated) : 75.3  Temp (24hrs), Avg:98.3 F (36.8 C), Min:97.8 F (36.6 C), Max:98.6 F (37 C)  Recent Labs  Lab 05/05/23 1201 05/07/23 0442  WBC 5.2  --   CREATININE 0.92 0.90  LATICACIDVEN 1.6  --     Estimated Creatinine Clearance: 94.9 mL/min (by C-G formula based on SCr of 0.9 mg/dL).    Allergies  Allergen Reactions   Glipizide Other (See Comments)    REACTION: wt. gain, hands tingling   Lisinopril Other (See Comments)    HYPERKALEMIA   Ramipril Cough   Ibuprofen     UNSPECIFIED REACTION    Bactrim [Sulfamethoxazole-Trimethoprim] Nausea And Vomiting and Rash    Antimicrobials this admission: vancomycin 10/12 >>  ceftriaxone 10/12 >>  Flagyl 10/12 >>  Dose adjustments this admission: N/A  Microbiology results: 10/12 bcx: pending   Thank you for allowing pharmacy to be a part of this patient's care.  Leanord Hawking, PharmD 05/07/2023 9:26 AM

## 2023-05-07 NOTE — Plan of Care (Signed)

## 2023-05-07 NOTE — Op Note (Signed)
[facility]  Percutaneous Study/Intervention Procedural Note   Date of Surgery: 05/07/2023  Surgeon(s):Dorean Hiebert    Assistants:none  Pre-operative Diagnosis: PAD with ulceration and infection LLE  Post-operative diagnosis:  Same  Procedure(s) Performed:             1.  Ultrasound guidance for vascular access right femoral artery             2.  Catheter placement into left SFA from right femoral approach             3.  Aortogram and selective left lower extremity angiogram             4.  Percutaneous transluminal angioplasty of left anterior tibial artery with 3 mm conventional and 4 mm Lutonix drug coated angioplasty balloon             5.  StarClose closure device right femoral artery  EBL: 10 cc  Contrast: 40 cc  Fluoro Time: 5.1 minutes  Moderate Conscious Sedation Time: approximately 37 minutes using 2 mg of Versed and 50 mcg of Fentanyl              Indications:  Patient is a 67 y.o.male with non-healing ulceration and osteomyelitis of the left foot. The patient is brought in for angiography for further evaluation and potential treatment.  Due to the limb threatening nature of the situation, angiogram was performed for attempted limb salvage. The patient is aware that if the procedure fails, amputation would be expected.  The patient also understands that even with successful revascularization, amputation may still be required due to the severity of the situation.  Risks and benefits are discussed and informed consent is obtained.   Procedure:  The patient was identified and appropriate procedural time out was performed.  The patient was then placed supine on the table and prepped and draped in the usual sterile fashion. Moderate conscious sedation was administered during a face to face encounter with the patient throughout the procedure with my supervision of the RN administering medicines and monitoring the patient's vital signs, pulse oximetry,  telemetry and mental status throughout from the start of the procedure until the patient was taken to the recovery room. Ultrasound was used to evaluate the right common femoral artery.  It was patent .  A digital ultrasound image was acquired.  A Seldinger needle was used to access the right common femoral artery under direct ultrasound guidance and a permanent image was performed.  A 0.035 J wire was advanced without resistance and a 5Fr sheath was placed.  Pigtail catheter was placed into the aorta and an AP aortogram was performed. This demonstrated normal renal arteries and normal aorta and iliac segments without significant stenosis. I then crossed the aortic bifurcation and advanced to the left femoral head and then into the left SFA to opacify distally. Selective left lower extremity angiogram was then performed. This demonstrated normal common femoral artery, profunda femoris artery, superficial femoral artery and popliteal artery.  There was a typical tibial trifurcation.  The posterior tibial artery was large and continuous into the foot without focal stenosis.  The peroneal artery was small and did provide a second runoff vessel but did not really fill the foot.  The anterior tibial artery was patent in the proximal to mid segment but then had an occlusion in the mid to distal segment although there was reconstitution I did fill into the foot beyond the 10 to 12 cm occlusion. It  was felt that it was in the patient's best interest to proceed with intervention after these images to avoid a second procedure and a larger amount of contrast and fluoroscopy based off of the findings from the initial angiogram. The patient was systemically heparinized and a 6 French 70 cm sheath was then placed over the Terumo Advantage wire. I then used a Kumpe catheter and an advantage wire to get down into the anterior tibial artery and exchanged for a Nava cross catheter and a V18 wire.  Using these, I was able to easily  cross into the foot across the occlusion.  I then treated the anterior tibial artery all the way into the foot and up to the mid to distal segment with a 3 mm diameter by 22 cm length angioplasty balloon inflated to 10 atm for 1 minute.  The proximal portion still had greater than 50% residual stenosis and the patient had extremely large vessels so I upsized to a 4 mm diameter by 15 cm length Lutonix drug-coated angioplasty balloon inflated this to 6 atm for 1 minute.  Completion imaging showed less than 25% residual stenosis although the foot and most distal anterior tibial artery had a large amount of spasm after treatment.  I elected to terminate the procedure. The sheath was removed and StarClose closure device was deployed in the right femoral artery with excellent hemostatic result. The patient was taken to the recovery room in stable condition having tolerated the procedure well.  Findings:               Aortogram:  This demonstrated normal renal arteries and normal aorta and iliac segments without significant stenosis.             Left Lower Extremity:  This demonstrated normal common femoral artery, profunda femoris artery, superficial femoral artery and popliteal artery.  There was a typical tibial trifurcation.  The posterior tibial artery was large and continuous into the foot without focal stenosis.  The peroneal artery was small and did provide a second runoff vessel but did not really fill the foot.  The anterior tibial artery was patent in the proximal to mid segment but then had an occlusion in the mid to distal segment although there was reconstitution I did fill into the foot beyond the 10 to 12 cm occlusion.   Disposition: Patient was taken to the recovery room in stable condition having tolerated the procedure well.  Complications: None  [name] 05/07/2023 1:50 PM   This note was created with Dragon Medical transcription system. Any errors in dictation are purely  unintentional.

## 2023-05-07 NOTE — Progress Notes (Signed)
Progress Note    [name]  VWU:[id number] DOB: [birth year]  DOA: 05/05/2023 PCP: Karie Schwalbe, MD      Brief Narrative:    Medical records reviewed and are as summarized below:  [name] is a 67 y.o. male with medical history significant for type 2 diabetes mellitus, hypertension, hyperlipidemia, gout, chronic anemia, alcohol use disorder, psoriasis, Schatzki's ring, arthritis, neuropathy, who was referred from Dr. Dory Larsen office (podiatrist) because of left foot infection.  He said he has had a wound on both feet.  However, he has been dealing with infection of the left foot for some time now and has been on oral antibiotics on and off for about 4 weeks.  He reported drainage from the left foot wound and noticed increasing redness.  He also complained of nausea and fatigue.  Workup revealed osteomyelitis of the left fifth MTP joint with septic arthritis.    Assessment/Plan:   Principal Problem:   Septic joint (HCC) Active Problems:   DM (diabetes mellitus) type II controlled, neurological manifestation (HCC)   Hyperlipemia   GERD   Symptomatic anemia   Alcohol use   Osteomyelitis of fifth toe of left foot (HCC)    Body mass index is 29.99 kg/m.   Septic arthritis of left fifth MTP joint and osteomyelitis of fifth metatarsal and fifth proximal phalanx: Continue IV ceftriaxone, metronidazole and vancomycin.  Analgesics as needed for pain.  Follow-up with podiatrist.   Peripheral artery disease: S/p angiogram which was significant for occlusion in the mid to distal segment of the left anterior tibial artery, transluminal angioplasty of the left anterior tibial artery on 05/07/2023.  Follow-up with vascular surgeon..   Alcohol use disorder: Continue Librium.  Benzodiazepines as needed per CIWA protocol.   Type II DM: Continue insulin glargine 10 units daily for now.  NovoLog as needed for hyperglycemia.  Adjust insulin therapy based on glucose  levels. He takes insulin glargine 15 units daily at home.  Hemoglobin A1c was 5.9 on 12/20/2022.   Hypokalemia: Continue to replete potassium Hypomagnesemia: Replete magnesium with IV magnesium sulfate    Hyponatremia: Asymptomatic.  Monitor electrolytes.   Chronic anemia: he follows with Dr. Donneta Romberg as an outpatient.   Comorbidities include hypertension, GERD, hyperlipidemia  Diet Order             Diet NPO time specified Except for: Sips with Meds  Diet effective now                            Consultants: Vascular surgeon Podiatrist  Procedures: None    Medications:    amLODipine  5 mg Oral Daily   chlordiazePOXIDE  25 mg Oral QID   DULoxetine  60 mg Oral Daily   enoxaparin (LOVENOX) injection  40 mg Subcutaneous Q24H   finasteride  5 mg Oral Daily   folic acid  1 mg Oral Daily   insulin aspart  0-15 Units Subcutaneous TID WC   insulin aspart  0-5 Units Subcutaneous QHS   insulin glargine-yfgn  10 Units Subcutaneous Daily   losartan  25 mg Oral Daily   multivitamin with minerals  1 tablet Oral Daily   simvastatin  40 mg Oral QHS   tamsulosin  0.4 mg Oral QHS   thiamine  100 mg Oral Daily   Or   thiamine  100 mg Intravenous Daily   Continuous Infusions:  cefTRIAXone (ROCEPHIN)  IV Stopped (05/06/23  1037)   And   metronidazole Stopped (05/07/23 0043)   magnesium sulfate bolus IVPB 2 g (05/07/23 0851)   vancomycin 166.7 mL/hr at 05/07/23 0610     Anti-infectives (From admission, onward)    Start     Dose/Rate Route Frequency Ordered Stop   05/06/23 1000  cefTRIAXone (ROCEPHIN) 2 g in sodium chloride 0.9 % 100 mL IVPB       Placed in "And" Linked Group   2 g 200 mL/hr over 30 Minutes Intravenous Every 24 hours 05/05/23 1545 05/13/23 0959   05/06/23 0500  vancomycin (VANCOREADY) IVPB 1250 mg/250 mL        1,250 mg 166.7 mL/hr over 90 Minutes Intravenous Every 12 hours 05/05/23 1554     05/05/23 1600  metroNIDAZOLE (FLAGYL) IVPB 500  mg       Placed in "And" Linked Group   500 mg 100 mL/hr over 60 Minutes Intravenous Every 8 hours 05/05/23 1545 05/12/23 1544   05/05/23 1445  vancomycin (VANCOCIN) injection 1,950 mg  Status:  Discontinued        20 mg/kg  97.5 kg Intravenous  Once 05/05/23 1439 05/05/23 1441   05/05/23 1445  cefTRIAXone (ROCEPHIN) 2 g in sodium chloride 0.9 % 100 mL IVPB        2 g 200 mL/hr over 30 Minutes Intravenous  Once 05/05/23 1439 05/05/23 1554   05/05/23 1445  vancomycin (VANCOREADY) IVPB 2000 mg/400 mL        2,000 mg 200 mL/hr over 120 Minutes Intravenous  Once 05/05/23 1441 05/05/23 1913              Family Communication/Anticipated D/C date and plan/Code Status   DVT prophylaxis: enoxaparin (LOVENOX) injection 40 mg Start: 05/05/23 2100     Code Status: Full Code  Family Communication: None Disposition Plan: Plan to discharge home   Status is: Inpatient Remains inpatient appropriate because: Left foot infection       Subjective:   His left foot feels better.  No complaints. Altha Harm, RN, was at th bedside  Objective:    Vitals:   05/06/23 1036 05/06/23 1528 05/06/23 2345 05/07/23 0731  BP: (!) 152/76 (!) 150/79 (!) 173/95 (!) 159/86  Pulse: 89 79 89 83  Resp: 18 18 18 16   Temp: 98.6 F (37 C) 98.5 F (36.9 C) 98.3 F (36.8 C) 97.8 F (36.6 C)  TempSrc:      SpO2: 97% 98% 94% 95%  Weight:      Height:       No data found.   Intake/Output Summary (Last 24 hours) at 05/07/2023 0903 Last data filed at 05/07/2023 0610 Gross per 24 hour  Intake 1590.97 ml  Output --  Net 1590.97 ml   Filed Weights   05/05/23 1200  Weight: 97.5 kg    Exam:  GEN: NAD SKIN: Warm and dry EYES: No pallor or icterus. ENT: MMM CV: RRR PULM: CTA B ABD: soft, ND, NT, +BS CNS: AAO x 3, non focal EXT: Erythema on the dorsal and lateral aspect of the left foot.  Small dry open wound on lateral border of the left foot.      Data Reviewed:   I have personally  reviewed following labs and imaging studies:  Labs: Labs show the following:   Basic Metabolic Panel: Recent Labs  Lab 05/05/23 1201 05/07/23 0442  NA 127* 131*  K 3.3* 3.4*  CL 83* 88*  CO2 29 30  GLUCOSE 256* 178*  BUN  10 8  CREATININE 0.92 0.90  CALCIUM 9.0 8.9  MG 1.5* 1.4*  PHOS  --  3.7   GFR Estimated Creatinine Clearance: 94.9 mL/min (by C-G formula based on SCr of 0.9 mg/dL). Liver Function Tests: Recent Labs  Lab 05/05/23 1201  AST 20  ALT 11  ALKPHOS 86  BILITOT 1.1  PROT 7.3  ALBUMIN 3.8   No results for input(s): "LIPASE", "AMYLASE" in the last 168 hours. No results for input(s): "AMMONIA" in the last 168 hours. Coagulation profile No results for input(s): "INR", "PROTIME" in the last 168 hours.  CBC: Recent Labs  Lab 05/05/23 1201  WBC 5.2  NEUTROABS 3.5  HGB 10.8*  HCT 31.8*  MCV 97.5  PLT 352   Cardiac Enzymes: No results for input(s): "CKTOTAL", "CKMB", "CKMBINDEX", "TROPONINI" in the last 168 hours. BNP (last 3 results) No results for input(s): "PROBNP" in the last 8760 hours. CBG: Recent Labs  Lab 05/06/23 0735 05/06/23 1221 05/06/23 1636 05/06/23 2104 05/07/23 0732  GLUCAP 124* 163* 160* 181* 165*   D-Dimer: No results for input(s): "DDIMER" in the last 72 hours. Hgb A1c: No results for input(s): "HGBA1C" in the last 72 hours. Lipid Profile: No results for input(s): "CHOL", "HDL", "LDLCALC", "TRIG", "CHOLHDL", "LDLDIRECT" in the last 72 hours. Thyroid function studies: No results for input(s): "TSH", "T4TOTAL", "T3FREE", "THYROIDAB" in the last 72 hours.  Invalid input(s): "FREET3" Anemia work up: No results for input(s): "VITAMINB12", "FOLATE", "FERRITIN", "TIBC", "IRON", "RETICCTPCT" in the last 72 hours. Sepsis Labs: Recent Labs  Lab 05/05/23 1201  WBC 5.2  LATICACIDVEN 1.6    Microbiology Recent Results (from the past 240 hour(s))  Aerobic/Anaerobic Culture w Gram Stain (surgical/deep wound)     Status: None  (Preliminary result)   Collection Time: 05/03/23  9:59 AM   Specimen: Wound  Result Value Ref Range Status   Specimen Description   Final    WOUND Performed at [facility], [address], [place], [place] [address]    Special Requests   Final    NONE Performed at [facility], [address] [place], [place] [address]    Gram Stain   Final    RARE WBC PRESENT, PREDOMINANTLY PMN NO ORGANISMS SEEN Performed at [facility], [address] [place], [place] [address]    Culture   Final    RARE PSEUDOMONAS AERUGINOSA MODERATE CORYNEBACTERIUM STRIATUM Standardized susceptibility testing for this organism is not available. NO ANAEROBES ISOLATED; CULTURE IN PROGRESS FOR 5 DAYS    Report Status PENDING  Incomplete   Organism ID, Bacteria PSEUDOMONAS AERUGINOSA  Final      Susceptibility   Pseudomonas aeruginosa - MIC*    CEFTAZIDIME 4 SENSITIVE Sensitive     CIPROFLOXACIN <=0.25 SENSITIVE Sensitive     GENTAMICIN 2 SENSITIVE Sensitive     IMIPENEM 2 SENSITIVE Sensitive     PIP/TAZO 8 SENSITIVE Sensitive ug/mL    CEFEPIME 4 SENSITIVE Sensitive     * RARE PSEUDOMONAS AERUGINOSA  Culture, blood (Routine X 2) w Reflex to ID Panel     Status: None (Preliminary result)   Collection Time: 05/05/23  4:38 PM   Specimen: BLOOD LEFT ARM  Result Value Ref Range Status   Specimen Description BLOOD LEFT ARM  Final   Special Requests   Final    BOTTLES DRAWN AEROBIC AND ANAEROBIC Blood Culture results may not be optimal due to an excessive volume of blood received in culture bottles   Culture  Final    NO GROWTH 2 DAYS Performed at [facility], [address] [place], [place] [address]    Report Status PENDING  Incomplete  Culture, blood (Routine X 2) w Reflex to ID Panel     Status: None (Preliminary result)   Collection Time: 05/05/23  4:38 PM   Specimen: BLOOD RIGHT ARM  Result Value Ref Range Status   Specimen Description BLOOD RIGHT ARM  Final    Special Requests   Final    BOTTLES DRAWN AEROBIC AND ANAEROBIC Blood Culture results may not be optimal due to an excessive volume of blood received in culture bottles   Culture   Final    NO GROWTH 2 DAYS Performed at [facility], [address], [place], [place] [address]    Report Status PENDING  Incomplete    Procedures and diagnostic studies:  MR FOOT LEFT WO CONTRAST  Result Date: 05/06/2023 CLINICAL DATA:  Left fifth toe infection. EXAM: MRI OF THE LEFT FOOT WITHOUT CONTRAST TECHNIQUE: Multiplanar, multisequence MR imaging of the left foot was performed. No intravenous contrast was administered. COMPARISON:  Left foot x-rays dated June 04, 2013. FINDINGS: Despite efforts by the technologist and patient, motion artifact is present on today's exam and could not be eliminated. This reduces exam sensitivity and specificity. Bones/Joint/Cartilage Prominent marrow edema with corresponding decreased T1 marrow signal involving the majority the fifth metatarsal, sparing the base, in the entire fifth proximal phalanx. There is early bone destruction of the fifth metatarsal head and fifth proximal phalanx base. Large fifth MTP joint. Dorsal subluxation of the fifth proximal phalanx with respect to the metatarsal head. No acute fracture. Moderate midfoot and mild first MTP joint osteoarthritis. Ligaments The fifth MTP joint collateral ligaments are torn. Remaining toe collateral ligaments are intact. Muscles and Tendons No tenosynovitis. Near complete atrophy of the intrinsic foot muscles. Soft tissue Small ulcer at the plantar lateral aspect of the fifth metatarsal head with sinus tract extending to bone (series 4, image 30). No soft tissue mass. IMPRESSION: 1. Small ulcer at the plantar lateral aspect of the fifth metatarsal head with sinus tract extending to bone. 2. Septic arthritis of the fifth MTP joint with osteomyelitis of the majority of the fifth metatarsal and entire fifth  proximal phalanx. Electronically Signed   By: Obie Dredge M.D.   On: 05/06/2023 09:30               LOS: 2 days   Monnica Saltsman  [facility]   Pager on [email or web address]. If 7PM-7AM, please contact night-coverage at [email or web address]     05/07/2023, 9:03 AM

## 2023-05-08 ENCOUNTER — Inpatient Hospital Stay: Payer: 59 | Admitting: Certified Registered"

## 2023-05-08 ENCOUNTER — Encounter: Payer: Self-pay | Admitting: Vascular Surgery

## 2023-05-08 ENCOUNTER — Encounter
Admission: EM | Disposition: A | Discharge status: Home / Self Care | Payer: Self-pay | Source: Home / Self Care | Attending: Internal Medicine

## 2023-05-08 DIAGNOSIS — E11628 Type 2 diabetes mellitus with other skin complications: Secondary | ICD-10-CM | POA: Diagnosis not present

## 2023-05-08 DIAGNOSIS — M009 Pyogenic arthritis, unspecified: Secondary | ICD-10-CM | POA: Diagnosis not present

## 2023-05-08 DIAGNOSIS — M86172 Other acute osteomyelitis, left ankle and foot: Secondary | ICD-10-CM

## 2023-05-08 DIAGNOSIS — I739 Peripheral vascular disease, unspecified: Secondary | ICD-10-CM | POA: Diagnosis not present

## 2023-05-08 DIAGNOSIS — L089 Local infection of the skin and subcutaneous tissue, unspecified: Secondary | ICD-10-CM | POA: Diagnosis not present

## 2023-05-08 HISTORY — PX: AMPUTATION TOE: SHX6595

## 2023-05-08 LAB — GLUCOSE, CAPILLARY
Glucose-Capillary: 172 mg/dL — ABNORMAL HIGH (ref 70–99)
Glucose-Capillary: 175 mg/dL — ABNORMAL HIGH (ref 70–99)
Glucose-Capillary: 127 mg/dL — ABNORMAL HIGH (ref 70–99)
Glucose-Capillary: 159 mg/dL — ABNORMAL HIGH (ref 70–99)
Glucose-Capillary: 264 mg/dL — ABNORMAL HIGH (ref 70–99)
Glucose-Capillary: 199 mg/dL — ABNORMAL HIGH (ref 70–99)

## 2023-05-08 LAB — VANCOMYCIN, RANDOM: Vancomycin Rm: 19 ug/mL

## 2023-05-08 LAB — AEROBIC/ANAEROBIC CULTURE W GRAM STAIN (SURGICAL/DEEP WOUND)

## 2023-05-08 SURGERY — AMPUTATION, TOE
Anesthesia: General | Site: Toe | Laterality: Left

## 2023-05-08 MED ORDER — MIDAZOLAM HCL 2 MG/2ML IJ SOLN
INTRAMUSCULAR | Status: DC | PRN
  Administered 2023-05-08 (×2): 1 mg via INTRAVENOUS

## 2023-05-08 MED ORDER — PROPOFOL 10 MG/ML IV BOLUS
INTRAVENOUS | Status: AC
  Filled 2023-05-08: qty 40

## 2023-05-08 MED ORDER — GENTAMICIN SULFATE 40 MG/ML IJ SOLN
INTRAMUSCULAR | Status: AC
  Filled 2023-05-08: qty 6

## 2023-05-08 MED ORDER — SEVOFLURANE IN SOLN
RESPIRATORY_TRACT | Status: AC
  Filled 2023-05-08: qty 250

## 2023-05-08 MED ORDER — SODIUM CHLORIDE 0.9 % IV SOLN
INTRAVENOUS | Status: DC | PRN
Start: 2023-05-08 — End: 2023-05-08

## 2023-05-08 MED ORDER — 0.9 % SODIUM CHLORIDE (POUR BTL) OPTIME
TOPICAL | Status: DC | PRN
  Administered 2023-05-08: 500 mL

## 2023-05-08 MED ORDER — PROPOFOL 500 MG/50ML IV EMUL
INTRAVENOUS | Status: DC | PRN
  Administered 2023-05-08: 100 ug/kg/min via INTRAVENOUS

## 2023-05-08 MED ORDER — SODIUM CHLORIDE 0.9 % IV SOLN
2.0000 g | Freq: Three times a day (TID) | INTRAVENOUS | Status: DC
  Administered 2023-05-08 – 2023-05-10 (×5): 2 g via INTRAVENOUS
  Filled 2023-05-08 (×6): qty 12.5

## 2023-05-08 MED ORDER — FENTANYL CITRATE (PF) 100 MCG/2ML IJ SOLN
INTRAMUSCULAR | Status: DC | PRN
  Administered 2023-05-08: 25 ug via INTRAVENOUS

## 2023-05-08 MED ORDER — INSULIN GLARGINE-YFGN 100 UNIT/ML ~~LOC~~ SOLN
10.0000 [IU] | Freq: Every day | SUBCUTANEOUS | Status: DC
  Administered 2023-05-09 – 2023-05-10 (×2): 10 [IU] via SUBCUTANEOUS
  Filled 2023-05-08 (×2): qty 0.1

## 2023-05-08 MED ORDER — MIDAZOLAM HCL 2 MG/2ML IJ SOLN: INTRAMUSCULAR | Status: DC | PRN

## 2023-05-08 MED ORDER — ROCURONIUM BROMIDE 10 MG/ML (PF) SYRINGE
PREFILLED_SYRINGE | INTRAVENOUS | Status: AC
  Filled 2023-05-08: qty 10

## 2023-05-08 MED ORDER — VANCOMYCIN HCL 1000 MG IV SOLR
INTRAVENOUS | Status: AC
  Filled 2023-05-08: qty 20

## 2023-05-08 MED ORDER — OXYCODONE-ACETAMINOPHEN 5-325 MG PO TABS: 1.0000 | ORAL_TABLET | ORAL | Status: DC | PRN

## 2023-05-08 MED ORDER — MORPHINE SULFATE (PF) 2 MG/ML IV SOLN: 2.0000 mg | INTRAVENOUS | Status: DC | PRN

## 2023-05-08 MED ORDER — LIDOCAINE HCL URETHRAL/MUCOSAL 2 % EX GEL
CUTANEOUS | Status: AC
  Filled 2023-05-08: qty 6

## 2023-05-08 MED ORDER — FENTANYL CITRATE (PF) 100 MCG/2ML IJ SOLN
INTRAMUSCULAR | Status: AC
  Filled 2023-05-08: qty 2

## 2023-05-08 MED ORDER — BUPIVACAINE HCL (PF) 0.5 % IJ SOLN
INTRAMUSCULAR | Status: AC
  Filled 2023-05-08: qty 30

## 2023-05-08 MED ORDER — BUPIVACAINE HCL 0.5 % IJ SOLN
INTRAMUSCULAR | Status: DC | PRN
  Administered 2023-05-08: 16 mL

## 2023-05-08 MED ORDER — PROPOFOL 10 MG/ML IV BOLUS
INTRAVENOUS | Status: AC
  Filled 2023-05-08: qty 20

## 2023-05-08 MED ORDER — PHENYLEPHRINE 80 MCG/ML (10ML) SYRINGE FOR IV PUSH (FOR BLOOD PRESSURE SUPPORT)
PREFILLED_SYRINGE | INTRAVENOUS | Status: DC | PRN
  Administered 2023-05-08 (×2): 80 ug via INTRAVENOUS

## 2023-05-08 MED ORDER — CHLORDIAZEPOXIDE HCL 25 MG PO CAPS
25.0000 mg | ORAL_CAPSULE | Freq: Three times a day (TID) | ORAL | Status: DC
  Filled 2023-05-08: qty 1

## 2023-05-08 MED ORDER — MIDAZOLAM HCL 2 MG/2ML IJ SOLN
INTRAMUSCULAR | Status: AC
  Filled 2023-05-08: qty 2

## 2023-05-08 MED ORDER — LIDOCAINE HCL (CARDIAC) PF 100 MG/5ML IV SOSY
PREFILLED_SYRINGE | INTRAVENOUS | Status: DC | PRN
  Administered 2023-05-08: 80 mg via INTRAVENOUS

## 2023-05-08 SURGICAL SUPPLY — 55 items
BLADE MED AGGRESSIVE (BLADE) ×1 IMPLANT
BLADE OSC/SAGITTAL MD 5.5X18 (BLADE) ×1 IMPLANT
BLADE SURG 15 STRL LF DISP TIS (BLADE) ×2 IMPLANT
BLADE SURG 15 STRL SS (BLADE) ×2
BLADE SURG MINI STRL (BLADE) ×1 IMPLANT
BNDG CMPR 5X4 KNIT ELC UNQ LF (GAUZE/BANDAGES/DRESSINGS) ×1
BNDG CMPR 75X21 PLY HI ABS (MISCELLANEOUS) ×1
BNDG ELASTIC 4INX 5YD STR LF (GAUZE/BANDAGES/DRESSINGS) ×1 IMPLANT
BNDG ESMARCH 4 X 12 STRL LF (GAUZE/BANDAGES/DRESSINGS)
BNDG ESMARCH 4X12 STRL LF (GAUZE/BANDAGES/DRESSINGS) ×1 IMPLANT
BNDG GAUZE DERMACEA FLUFF 4 (GAUZE/BANDAGES/DRESSINGS) ×1 IMPLANT
BNDG GZE 12X3 1 PLY HI ABS (GAUZE/BANDAGES/DRESSINGS) ×1
BNDG GZE DERMACEA 4 6PLY (GAUZE/BANDAGES/DRESSINGS) ×2
BNDG STRETCH GAUZE 3IN X12FT (GAUZE/BANDAGES/DRESSINGS) ×1 IMPLANT
CUFF TOURN SGL QUICK 12 (TOURNIQUET CUFF) ×1 IMPLANT
CUFF TOURN SGL QUICK 18X4 (TOURNIQUET CUFF) ×1 IMPLANT
DRAPE FLUOR MINI C-ARM 54X84 (DRAPES) ×1 IMPLANT
DURAPREP 26ML APPLICATOR (WOUND CARE) ×1 IMPLANT
ELECT REM PT RETURN 9FT ADLT (ELECTROSURGICAL) ×1
ELECTRODE REM PT RTRN 9FT ADLT (ELECTROSURGICAL) ×1 IMPLANT
GAUZE SPONGE 4X4 12PLY STRL (GAUZE/BANDAGES/DRESSINGS) ×1 IMPLANT
GAUZE STRETCH 2X75IN STRL (MISCELLANEOUS) ×1 IMPLANT
GAUZE XEROFORM 1X8 LF (GAUZE/BANDAGES/DRESSINGS) ×1 IMPLANT
GLOVE BIO SURGEON STRL SZ7.5 (GLOVE) ×1 IMPLANT
GLOVE INDICATOR 8.0 STRL GRN (GLOVE) ×1 IMPLANT
GOWN STRL REUS W/ TWL LRG LVL3 (GOWN DISPOSABLE) ×2 IMPLANT
GOWN STRL REUS W/TWL LRG LVL3 (GOWN DISPOSABLE) ×2
HANDPIECE VERSAJET DEBRIDEMENT (MISCELLANEOUS) ×1 IMPLANT
IV NS IRRIG 3000ML ARTHROMATIC (IV SOLUTION) ×1 IMPLANT
KIT STIMULAN RAPID CURE 10CC (Orthopedic Implant) IMPLANT
KIT TURNOVER KIT A (KITS) ×1 IMPLANT
LABEL OR SOLS (LABEL) ×1 IMPLANT
MANIFOLD NEPTUNE II (INSTRUMENTS) ×1 IMPLANT
NDL FILTER BLUNT 18X1 1/2 (NEEDLE) ×1 IMPLANT
NDL HYPO 25X1 1.5 SAFETY (NEEDLE) ×2 IMPLANT
NEEDLE FILTER BLUNT 18X1 1/2 (NEEDLE) ×1 IMPLANT
NEEDLE HYPO 25X1 1.5 SAFETY (NEEDLE) ×2 IMPLANT
NS IRRIG 500ML POUR BTL (IV SOLUTION) ×1 IMPLANT
PACK EXTREMITY ARMC (MISCELLANEOUS) ×1 IMPLANT
PAD ABD DERMACEA PRESS 5X9 (GAUZE/BANDAGES/DRESSINGS) ×2 IMPLANT
SOL PREP PVP 2OZ (MISCELLANEOUS) ×1
SOLUTION PREP PVP 2OZ (MISCELLANEOUS) ×1 IMPLANT
SPONGE T-LAP 18X18 ~~LOC~~+RFID (SPONGE) IMPLANT
STOCKINETTE BIAS CUT 4 980044 (GAUZE/BANDAGES/DRESSINGS) ×1 IMPLANT
STOCKINETTE STRL 6IN 960660 (GAUZE/BANDAGES/DRESSINGS) ×1 IMPLANT
STRIP CLOSURE SKIN 1/4X4 (GAUZE/BANDAGES/DRESSINGS) ×1 IMPLANT
SUT ETHILON 3-0 FS-10 30 BLK (SUTURE) ×2
SUT ETHILON 4-0 (SUTURE)
SUT ETHILON 4-0 FS2 18XMFL BLK (SUTURE)
SUTURE EHLN 3-0 FS-10 30 BLK (SUTURE) ×1 IMPLANT
SUTURE ETHLN 4-0 FS2 18XMF BLK (SUTURE) IMPLANT
SWAB CULTURE AMIES ANAERIB BLU (MISCELLANEOUS) ×1 IMPLANT
SYR 10ML LL (SYRINGE) ×1 IMPLANT
TRAP FLUID SMOKE EVACUATOR (MISCELLANEOUS) ×1 IMPLANT
WATER STERILE IRR 500ML POUR (IV SOLUTION) ×1 IMPLANT

## 2023-05-08 NOTE — Plan of Care (Signed)

## 2023-05-08 NOTE — Plan of Care (Signed)
  Problem: Education: Goal: Ability to describe self-care measures that may prevent or decrease complications (Diabetes Survival Skills Education) will improve Outcome: Progressing Goal: Individualized Educational Video(s) Outcome: Progressing   Problem: Health Behavior/Discharge Planning: Goal: Ability to manage health-related needs will improve Outcome: Progressing   Problem: Skin Integrity: Goal: Risk for impaired skin integrity will decrease Outcome: Progressing   Problem: Coping: Goal: Level of anxiety will decrease Outcome: Progressing   Problem: Safety: Goal: Ability to remain free from injury will improve Outcome: Progressing   Problem: Skin Integrity: Goal: Risk for impaired skin integrity will decrease Outcome: Progressing

## 2023-05-08 NOTE — Anesthesia Preprocedure Evaluation (Signed)
Anesthesia Evaluation  Patient identified by MRN, date of birth, ID band Patient awake    Reviewed: Allergy & Precautions, H&P , NPO status , Patient's Chart, lab work & pertinent test results  History of Anesthesia Complications Negative for: history of anesthetic complications  Airway Mallampati: III  TM Distance: >3 FB Neck ROM: Full    Dental no notable dental hx. (+) Chipped  Chipped One missing, one capped but not in upper front teeth:  :   Pulmonary neg pulmonary ROS, neg sleep apnea, neg COPD, Patient abstained from smoking.Not current smoker   Pulmonary exam normal breath sounds clear to auscultation       Cardiovascular Exercise Tolerance: Good METShypertension, Pt. on medications + Peripheral Vascular Disease  (-) CAD and (-) Past MI Normal cardiovascular exam(-) dysrhythmias  Rhythm:Regular Rate:Normal     Neuro/Psych  Neuromuscular disease  negative psych ROS   GI/Hepatic Neg liver ROS,GERD  Controlled and Medicated,,  Endo/Other  diabetes, Well Controlled, Insulin Dependent    Renal/GU negative Renal ROS  negative genitourinary   Musculoskeletal negative musculoskeletal ROS (+) Arthritis ,    Abdominal   Peds negative pediatric ROS (+)  Hematology negative hematology ROS (+) Blood dyscrasia, anemia   Anesthesia Other Findings GERD (gastroesophageal reflux disease) Gout Hyperlipidemia  Hypertension Psoriasis  Hammertoe Bunion  Plantar flexed metatarsal Adenomatous polyp  Bilateral open foot ulcers Arthritis Allergy Neuromuscular disorder (HCC) Diabetes mellitus  Anemia Schatzki's ring   Previous nasal septoplasty 02-04-23 with Dr. Juel Burrow anesthesiologist  Reproductive/Obstetrics negative OB ROS                              Anesthesia Physical Anesthesia Plan  ASA: 3  Anesthesia Plan: General   Post-op Pain Management: Minimal or no pain anticipated    Induction: Intravenous  PONV Risk Score and Plan: 2 and Propofol infusion, TIVA, Ondansetron, Midazolam and Dexamethasone  Airway Management Planned: Natural Airway and Nasal Cannula  Additional Equipment: None  Intra-op Plan:   Post-operative Plan:   Informed Consent: I have reviewed the patients History and Physical, chart, labs and discussed the procedure including the risks, benefits and alternatives for the proposed anesthesia with the patient or authorized representative who has indicated his/her understanding and acceptance.     Dental Advisory Given  Plan Discussed with: Anesthesiologist, CRNA and Surgeon  Anesthesia Plan Comments: (Patient consented for risks of anesthesia including but not limited to:  - adverse reactions to medications - damage to eyes, teeth, lips or other oral mucosa - nerve damage due to positioning  - sore throat or hoarseness - Damage to heart, brain, nerves, lungs, other parts of body or loss of life  Patient voiced understanding.)         Anesthesia Quick Evaluation

## 2023-05-08 NOTE — Progress Notes (Signed)
  Progress Note    05/08/2023 11:02 AM 1 Day Post-Op  Subjective:  [name] is a 67 yo male now POD #1 from  Aortogram and selective left lower extremity angiogram with angioplasty of the left tibial artery. Patient resting comfortably in bed this morning. He endorses his left foot pain has resolved this morning. Overall he feels better. Endorses soreness to his left groin but not real pain. Dressing in place. No other complaints overnight. Vital all remain stable.    Vitals:   05/07/23 2352 05/08/23 0740  BP: (!) 150/83 133/72  Pulse: 88 84  Resp: 16 14  Temp: 98.6 F (37 C) 98.2 F (36.8 C)  SpO2: 98% 98%   Physical Exam: Cardiac:  RRR, Normal S1, S2. No Murmurs.  Lungs:  Lungs are clear on auscultation, No rales, Rhonchi or wheezing.  Incisions:  None Extremities:  Left Lower Extremity warm to touch. Able to doppler DP/PT pulses. Non Healing sore on lateral left foot.  Abdomen:  Positive bowel sounds throughout. Soft and non tender and non distended.  Neurologic: AAOX4, answers questions and follows commands appropriately.   CBC    Component Value Date/Time   WBC 5.2 05/05/2023 1201   RBC 3.26 (L) 05/05/2023 1201   HGB 10.8 (L) 05/05/2023 1201   HGB 11.7 (L) 12/20/2022 1004   HCT 31.8 (L) 05/05/2023 1201   HCT 33.9 (L) 12/20/2022 1004   PLT 352 05/05/2023 1201   PLT 239 12/20/2022 1004   MCV 97.5 05/05/2023 1201   MCV 99 (H) 12/20/2022 1004   MCH 33.1 05/05/2023 1201   MCHC 34.0 05/05/2023 1201   RDW 13.2 05/05/2023 1201   RDW 13.0 12/20/2022 1004   LYMPHSABS 1.2 05/05/2023 1201   LYMPHSABS 1.5 03/31/2021 0707   MONOABS 0.4 05/05/2023 1201   EOSABS 0.2 05/05/2023 1201   EOSABS 0.3 03/31/2021 0707   BASOSABS 0.0 05/05/2023 1201   BASOSABS 0.1 03/31/2021 0707    BMET    Component Value Date/Time   NA 131 (L) 05/07/2023 0442   NA 131 (L) 12/20/2022 1004   K 3.4 (L) 05/07/2023 0442   CL 88 (L) 05/07/2023 0442   CO2 30 05/07/2023 0442   GLUCOSE 178 (H)  05/07/2023 0442   BUN 8 05/07/2023 0442   BUN 10 12/20/2022 1004   CREATININE 0.90 05/07/2023 0442   CREATININE 1.23 01/03/2013 1212   CALCIUM 8.9 05/07/2023 0442   GFRNONAA >60 05/07/2023 0442   GFRNONAA >60 01/03/2013 1212   GFRAA 95 03/25/2020 0755   GFRAA >60 01/03/2013 1212    INR No results found for: "INR"   Intake/Output Summary (Last 24 hours) at 05/08/2023 1102 Last data filed at 05/07/2023 1700 Gross per 24 hour  Intake 24 ml  Output --  Net 24 ml     Assessment/Plan:  67 y.o. male is s/p Aortogram and selective left lower extremity angiogram with angioplasty of the left tibial artery. 1 Day Post-Op   PLAN: Patient noted to have Osteomyelitis of the left foot.Podiatry with Dr Alberteen Spindle patient to go to surgery today to have amputation of the fifth toe with partial ray resection.  Vascular Surgery will sign off this case today. If needed in the future please reconsult.   DVT prophylaxis:  Lovenox 40 mg Q24 hrs   Marcie Bal [facility] 05/08/2023 11:02 AM

## 2023-05-08 NOTE — Consult Note (Signed)
NAME: [name]  DOB: [birth year]  MRN: [id number]  Date/Time: 05/08/2023 3:17 PM  REQUESTING PROVIDER: dr.Cline Subjective:  REASON FOR CONSULT: left foot infection ?family and friends at bed side [name] is a 67 y.o. with a history of DM, RT TMA, left foot infection Underwent left fifth toe [artial ray amputation, nasal septoplasty aug 2024, cataract surgery rt eye sept 2024, anemia followed by Dr.B, and he had BM biopsy on 04/27/23 by IR, psoriasis on TALTZ presented to the ED from podiatrist office for worsenig left foot infection and need for MRI and IV antibiotics  Antibioitc use in the recent past    On arrival to the ED vitals were  05/05/23 17:06  BP 158/79 !  Temp 98.4 F (36.9 C)  Pulse Rate 94  Resp 17  SpO2 95 %    Latest Reference Range & Units 05/05/23 12:01  WBC 4.0 - 10.5 K/uL 5.2  Hemoglobin 13.0 - 17.0 g/dL 84.6 (L)  HCT 96.2 - 95.2 % 31.8 (L)  Platelets 150 - 400 K/uL 352  Creatinine 0.61 - 1.24 mg/dL 8.41   Pt was started on borad spectrum He underwent angioplasty of the left ATA on 05/07/23 Underwent ray amputation partial of the 5th toe on 10/15 I am asked to see the patient for antibiotic management of the infection Pseudomonas in superficial culture- surgical culture pending  Past Medical History:  Diagnosis Date   Adenomatous polyp    Allergy    Anemia    Arthritis    feet    Bunion 04/02/2013   STATUS POST OP BUN REPAIR   Diabetes mellitus    Foot ulcer (HCC)    GERD (gastroesophageal reflux disease)    Gout    Hammertoe    Hyperlipidemia    Hypertension    Neuromuscular disorder (HCC)    neuropathy   Plantar flexed metatarsal    Psoriasis    Schatzki's ring     Past Surgical History:  Procedure Laterality Date   CARPAL TUNNEL RELEASE Left 08/24/2022   Procedure: CARPAL TUNNEL RELEASE;  Surgeon: Deeann Saint, MD;  Location: [facility];  Service: Orthopedics;  Laterality: Left;   CATARACT EXTRACTION W/PHACO Left 03/21/2023    Procedure: CATARACT EXTRACTION PHACO AND INTRAOCULAR LENS PLACEMENT (IOC) LEFT MALYUGIN DIABETIC 9.88 1:01.4;  Surgeon: Lockie Mola, MD;  Location: [facility];  Service: Ophthalmology;  Laterality: Left;   CATARACT EXTRACTION W/PHACO Right 04/04/2023   Procedure: CATARACT EXTRACTION PHACO AND INTRAOCULAR LENS PLACEMENT (IOC) RIGHT MALYUGIN DIABETIC 15.31 01:15.1;  Surgeon: Lockie Mola, MD;  Location: [facility];  Service: Ophthalmology;  Laterality: Right;   COLONOSCOPY     FOOT AMPUTATION Right 01/11/2017   Dr Deborah Chalk ([facility])--transmetatarsal   FOOT SURGERY Right 02/28/2013   Arta Bruce HAM TOE2,3 PINS ,2ND MET OSTEOTOMY, 5TH MET W/SCREW   [phone], 04-2013   HIP FRACTURE SURGERY  1993   surgery x2   IR BONE MARROW BIOPSY & ASPIRATION  04/27/2023   LOWER EXTREMITY ANGIOGRAPHY Left 05/07/2023   Procedure: Lower Extremity Angiography;  Surgeon: Annice Needy, MD;  Location: [facility];  Service: Cardiovascular;  Laterality: Left;   NASAL SEPTOPLASTY W/ TURBINOPLASTY Bilateral 02/08/2023   Procedure: NASAL SEPTOPLASTY WITH TURBINATE REDUCTION;  Surgeon: Vernie Murders, MD;  Location: [facility];  Service: ENT;  Laterality: Bilateral;   POLYPECTOMY     PROSTATE BIOPSY  10/10/2019   TOTAL HIP ARTHROPLASTY     Left hip replacement/femur fx 07/04, Screw removal  left hip- Hines 11/01    Social History   Socioeconomic History   Marital status: Married    Spouse name: Not on file   Number of children: 3   Years of education: Not on file   Highest education level: Not on file  Occupational History   Occupation: PC ADMINISTRATOR    Employer: [employer]  Tobacco Use   Smoking status: Never    Passive exposure: Past   Smokeless tobacco: Never  Vaping Use   Vaping status: Never Used  Substance and Sexual Activity   Alcohol use: Yes    Alcohol/week: 1.0 standard drink of alcohol    Types: 1 Standard drinks or equivalent per week    Comment: occasional  (DWI 1991)   Drug use: No   Sexual activity: Yes    Birth control/protection: None  Other Topics Concern   Not on file  Social History Narrative    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 With wife; and 2 dog.     Social Determinants of Health   Financial Resource Strain: Not on file  Food Insecurity: No Food Insecurity (05/06/2023)   Hunger Vital Sign    Worried About Running Out of Food in the Last Year: Never true    Ran Out of Food in the Last Year: Never true  Transportation Needs: No Transportation Needs (05/06/2023)   PRAPARE - Administrator, Civil Service (Medical): No    Lack of Transportation (Non-Medical): No  Physical Activity: Not on file  Stress: Not on file  Social Connections: Not on file  Intimate Partner Violence: Not At Risk (05/06/2023)   Humiliation, Afraid, Rape, and Kick questionnaire    Fear of Current or Ex-Partner: No    Emotionally Abused: No    Physically Abused: No    Sexually Abused: No    Family History  Problem Relation Age of Onset   Diabetes Mother    Diabetes Brother    Alzheimer's disease Maternal Aunt    Alzheimer's disease Cousin    Cancer Neg Hx    Colon cancer Neg Hx    Colon polyps Neg Hx    Rectal cancer Neg Hx    Stomach cancer Neg Hx    Esophageal cancer Neg Hx    Allergies  Allergen Reactions   Glipizide Other (See Comments)    REACTION: wt. gain, hands tingling   Lisinopril Other (See Comments)    HYPERKALEMIA   Ramipril Cough   Ibuprofen     UNSPECIFIED REACTION    Bactrim [Sulfamethoxazole-Trimethoprim] Nausea And Vomiting and Rash   I? Current Facility-Administered Medications  Medication Dose Route Frequency Provider Last Rate Last Admin   amLODipine (NORVASC) tablet 5 mg  5 mg Oral Daily Linus Galas, DPM   5 mg at 05/08/23 1004   ascorbic acid (VITAMIN C) tablet 500 mg  500 mg Oral BID Linus Galas, DPM   500 mg at 05/08/23 1013   cefTRIAXone  (ROCEPHIN) 2 g in sodium chloride 0.9 % 100 mL IVPB  2 g Intravenous Q24H Linus Galas, DPM 200 mL/hr at 05/08/23 1016 2 g at 05/08/23 1016   And   metroNIDAZOLE (FLAGYL) IVPB 500 mg  500 mg Intravenous Q8H Linus Galas, DPM 100 mL/hr at 05/08/23 1511 500 mg at 05/08/23 1511   DULoxetine (CYMBALTA) DR capsule 60 mg  60 mg Oral Daily Linus Galas, DPM   60 mg at 05/08/23 1005   enoxaparin (  LOVENOX) injection 40 mg  40 mg Subcutaneous Q24H Linus Galas, DPM   40 mg at 05/07/23 2206   feeding supplement (ENSURE ENLIVE / ENSURE PLUS) liquid 237 mL  237 mL Oral QHS Linus Galas, DPM       finasteride (PROSCAR) tablet 5 mg  5 mg Oral Daily Linus Galas, DPM   5 mg at 05/08/23 1004   folic acid (FOLVITE) tablet 1 mg  1 mg Oral Daily Linus Galas, DPM   1 mg at 05/08/23 1005   insulin aspart (novoLOG) injection 0-15 Units  0-15 Units Subcutaneous TID WC Linus Galas, DPM   3 Units at 05/08/23 0818   insulin aspart (novoLOG) injection 0-5 Units  0-5 Units Subcutaneous QHS Linus Galas, DPM   2 Units at 05/07/23 2206   [START ON 05/09/2023] insulin glargine-yfgn (SEMGLEE) injection 10 Units  10 Units Subcutaneous Daily Linus Galas, DPM       LORazepam (ATIVAN) tablet 1-4 mg  1-4 mg Oral Q1H PRN Linus Galas, DPM       Or   LORazepam (ATIVAN) tablet 0.5 mg  0.5 mg Oral Q4H PRN Linus Galas, DPM       losartan (COZAAR) tablet 25 mg  25 mg Oral Daily Linus Galas, DPM   25 mg at 05/08/23 1004   morphine (PF) 2 MG/ML injection 2 mg  2 mg Intravenous Q2H PRN Linus Galas, DPM       multivitamin with minerals tablet 1 tablet  1 tablet Oral Daily Linus Galas, DPM   1 tablet at 05/08/23 1004   nutrition supplement (JUVEN) (JUVEN) powder packet 1 packet  1 packet Oral BID BM Linus Galas, DPM       oxyCODONE-acetaminophen (PERCOCET/ROXICET) 5-325 MG per tablet 1-2 tablet  1-2 tablet Oral Q4H PRN Linus Galas, DPM       simvastatin (ZOCOR) tablet 40 mg  40 mg Oral QHS Linus Galas, DPM   40 mg at 05/07/23 2205   tamsulosin (FLOMAX)  capsule 0.4 mg  0.4 mg Oral QHS Linus Galas, DPM   0.4 mg at 05/07/23 2205   thiamine (VITAMIN B1) tablet 100 mg  100 mg Oral Daily Linus Galas, DPM   100 mg at 05/08/23 1004   Or   thiamine (VITAMIN B1) injection 100 mg  100 mg Intravenous Daily Linus Galas, DPM       vancomycin (VANCOREADY) IVPB 1250 mg/250 mL  1,250 mg Intravenous Q12H Linus Galas, DPM 166.7 mL/hr at 05/08/23 0519 Restarted at 05/08/23 0519   zinc sulfate capsule 220 mg  220 mg Oral Daily Linus Galas, DPM   220 mg at 05/08/23 1004     Abtx:  Anti-infectives (From admission, onward)    Start     Dose/Rate Route Frequency Ordered Stop   05/07/23 1154  ceFAZolin (ANCEF) IVPB 2g/100 mL premix  Status:  Discontinued        2 g 200 mL/hr over 30 Minutes Intravenous 30 min pre-op 05/07/23 1154 05/07/23 1247   05/06/23 1000  cefTRIAXone (ROCEPHIN) 2 g in sodium chloride 0.9 % 100 mL IVPB       Placed in "And" Linked Group   2 g 200 mL/hr over 30 Minutes Intravenous Every 24 hours 05/05/23 1545 05/13/23 0959   05/06/23 0500  vancomycin (VANCOREADY) IVPB 1250 mg/250 mL        1,250 mg 166.7 mL/hr over 90 Minutes Intravenous Every 12 hours 05/05/23 1554     05/05/23 1600  metroNIDAZOLE (FLAGYL) IVPB 500 mg  Placed in "And" Linked Group   500 mg 100 mL/hr over 60 Minutes Intravenous Every 8 hours 05/05/23 1545 05/12/23 1544   05/05/23 1445  vancomycin (VANCOCIN) injection 1,950 mg  Status:  Discontinued        20 mg/kg  97.5 kg Intravenous  Once 05/05/23 1439 05/05/23 1441   05/05/23 1445  cefTRIAXone (ROCEPHIN) 2 g in sodium chloride 0.9 % 100 mL IVPB        2 g 200 mL/hr over 30 Minutes Intravenous  Once 05/05/23 1439 05/05/23 1554   05/05/23 1445  vancomycin (VANCOREADY) IVPB 2000 mg/400 mL        2,000 mg 200 mL/hr over 120 Minutes Intravenous  Once 05/05/23 1441 05/05/23 1913       REVIEW OF SYSTEMS:  Const: negative fever,  chills once , negative weight loss Eyes: negative diplopia or visual changes,  negative eye pain ENT: negative coryza, negative sore throat Resp: negative cough, hemoptysis, dyspnea Cards: negative for chest pain, palpitations, lower extremity edema GU: negative for frequency, dysuria and hematuria GI: Negative for abdominal pain, diarrhea, bleeding, constipation Skin: negative for rash and pruritus Heme: negative for easy bruising and gum/nose bleeding MS: foot pain Neurolo:negative for headaches, dizziness, vertigo, memory problems  Psych: negative for feelings of anxiety, depression  Endocrine:  diabetes Allergy/Immunology- sulfa Objective:  VITALS:  BP (!) 149/84 (BP Location: Right Arm)   Pulse 89   Temp 98 F (36.7 C) (Oral)   Resp 18   Ht 5' 11.5" (1.816 m)   Wt 98.9 kg   SpO2 97%   BMI 29.98 kg/m   PHYSICAL EXAM:  General: Alert, cooperative, no distress, appears stated age.  Head: Normocephalic, without obvious abnormality, atraumatic. Eyes: Conjunctivae clear, anicteric sclerae. Pupils are equal ENT Nares normal. No drainage or sinus tenderness. Lips, mucosa, and tongue normal. No Thrush Neck: Supple, symmetrical, no adenopathy, thyroid: non tender no carotid bruit and no JVD. Back: No CVA tenderness. Lungs: Clear to auscultation bilaterally. No Wheezing or Rhonchi. No rales. Heart: Regular rate and rhythm, no murmur, rub or gallop. Abdomen: Soft, non-tender,not distended. Bowel sounds normal. No masses Extremities: left foot surgical dresisng not removed Rt foot TMA- small ulcer on the plantar surface 1st mtp area Skin: No rashes or lesions. Or bruising Lymph: Cervical, supraclavicular normal. Neurologic: Grossly non-focal Pertinent Labs Lab Results CBC    Component Value Date/Time   WBC 5.2 05/05/2023 1201   RBC 3.26 (L) 05/05/2023 1201   HGB 10.8 (L) 05/05/2023 1201   HGB 11.7 (L) 12/20/2022 1004   HCT 31.8 (L) 05/05/2023 1201   HCT 33.9 (L) 12/20/2022 1004   PLT 352 05/05/2023 1201   PLT 239 12/20/2022 1004   MCV 97.5  05/05/2023 1201   MCV 99 (H) 12/20/2022 1004   MCH 33.1 05/05/2023 1201   MCHC 34.0 05/05/2023 1201   RDW 13.2 05/05/2023 1201   RDW 13.0 12/20/2022 1004   LYMPHSABS 1.2 05/05/2023 1201   LYMPHSABS 1.5 03/31/2021 0707   MONOABS 0.4 05/05/2023 1201   EOSABS 0.2 05/05/2023 1201   EOSABS 0.3 03/31/2021 0707   BASOSABS 0.0 05/05/2023 1201   BASOSABS 0.1 03/31/2021 0707       Latest Ref Rng & Units 05/07/2023    4:42 AM 05/05/2023   12:01 PM 12/20/2022   10:04 AM  CMP  Glucose 70 - 99 mg/dL 914  782  69   BUN 8 - 23 mg/dL 8  10  10    Creatinine 0.61 -  1.24 mg/dL 0.86  5.78  4.69   Sodium 135 - 145 mmol/L 131  127  131   Potassium 3.5 - 5.1 mmol/L 3.4  3.3  4.6   Chloride 98 - 111 mmol/L 88  83  90   CO2 22 - 32 mmol/L 30  29  25    Calcium 8.9 - 10.3 mg/dL 8.9  9.0  9.7   Total Protein 6.5 - 8.1 g/dL  7.3  6.8   Total Bilirubin 0.3 - 1.2 mg/dL  1.1  0.5   Alkaline Phos 38 - 126 U/L  86  62   AST 15 - 41 U/L  20  34   ALT 0 - 44 U/L  11  20       Microbiology: Recent Results (from the past 240 hour(s))  Aerobic/Anaerobic Culture w Gram Stain (surgical/deep wound)     Status: None   Collection Time: 05/03/23  9:59 AM   Specimen: Wound  Result Value Ref Range Status   Specimen Description   Final    WOUND Performed at [facility], [address], [place], [place] [address]    Special Requests   Final    NONE Performed at [facility], [address] [place], [place] [address]    Gram Stain   Final    RARE WBC PRESENT, PREDOMINANTLY PMN NO ORGANISMS SEEN    Culture   Final    RARE PSEUDOMONAS AERUGINOSA MODERATE CORYNEBACTERIUM STRIATUM Standardized susceptibility testing for this organism is not available. NO ANAEROBES ISOLATED Performed at [facility], [address] [place], [place] [address]    Report Status 05/08/2023 FINAL  Final   Organism ID, Bacteria PSEUDOMONAS AERUGINOSA  Final      Susceptibility   Pseudomonas aeruginosa  - MIC*    CEFTAZIDIME 4 SENSITIVE Sensitive     CIPROFLOXACIN <=0.25 SENSITIVE Sensitive     GENTAMICIN 2 SENSITIVE Sensitive     IMIPENEM 2 SENSITIVE Sensitive     PIP/TAZO 8 SENSITIVE Sensitive ug/mL    CEFEPIME 4 SENSITIVE Sensitive     * RARE PSEUDOMONAS AERUGINOSA  Culture, blood (Routine X 2) w Reflex to ID Panel     Status: None (Preliminary result)   Collection Time: 05/05/23  4:38 PM   Specimen: BLOOD LEFT ARM  Result Value Ref Range Status   Specimen Description BLOOD LEFT ARM  Final   Special Requests   Final    BOTTLES DRAWN AEROBIC AND ANAEROBIC Blood Culture results may not be optimal due to an excessive volume of blood received in culture bottles   Culture   Final    NO GROWTH 3 DAYS Performed at [facility], [address] [place], [place] [address]    Report Status PENDING  Incomplete  Culture, blood (Routine X 2) w Reflex to ID Panel     Status: None (Preliminary result)   Collection Time: 05/05/23  4:38 PM   Specimen: BLOOD RIGHT ARM  Result Value Ref Range Status   Specimen Description BLOOD RIGHT ARM  Final   Special Requests   Final    BOTTLES DRAWN AEROBIC AND ANAEROBIC Blood Culture results may not be optimal due to an excessive volume of blood received in culture bottles   Culture   Final    NO GROWTH 3 DAYS Performed at [facility], [address] [place], [place] [address]    Report Status PENDING  Incomplete    IMAGING RESULTS: MRI left foot  reviewed Osteo of the 5th metatarsal I have personally reviewed the films ? Impression/Recommendation DM with PAD with infected would left foot causing osteomyelitis of the 5th toe S/p partial ray amputation of 5th toe On ceftriaxone, vanco flagyl Change ceftriaxone to cefepime as superficial culture was pseudomonas Route, duration and type of antibiotic will be decided after culture and pathology available   PAD s/p angip to left ATA  Peripheral neuropathy  HTN on losartana nd  amlodipine  Psoriasis on Taltz- some immune compromised state?  Anemia- recent BM biopsy was Okay ? Discussed the management with patient, wife and others at bed side? _  ________________________________________________ Discussed with patient, requesting provider Note:  This document was prepared using Dragon voice recognition software and may include unintentional dictation errors.

## 2023-05-08 NOTE — Transfer of Care (Signed)
Immediate Anesthesia Transfer of Care Note  Patient: [name]  Procedure(s) Performed: AMPUTATION 5TH TOE WITH PARTIAL RAY RESECTION (Left: Toe)  Patient Location: PACU  Anesthesia Type:General  Level of Consciousness: awake, alert , and oriented  Airway & Oxygen Therapy: Patient Spontanous Breathing  Post-op Assessment: Report given to RN and Post -op Vital signs reviewed and stable  Post vital signs: Reviewed  Last Vitals: Normal temp  See PACU flow sheet  Vitals Value Taken Time  BP 129/80 05/08/23 1332  Temp    Pulse 87 05/08/23 1335  Resp 15 05/08/23 1335  SpO2 95 % 05/08/23 1335  Vitals shown include unfiled device data.  Last Pain:  Vitals:   05/08/23 1152  TempSrc:   PainSc: 0-No pain         Complications: No notable events documented.

## 2023-05-08 NOTE — Interval H&P Note (Signed)
History and Physical Interval Note:  05/08/2023 12:19 PM  [name]  has presented today for surgery, with the diagnosis of osteomyelitis.  The various methods of treatment have been discussed with the patient and family. After consideration of risks, benefits and other options for treatment, the patient has consented to  Procedure(s): AMPUTATION 5TH TOE WITH PARTIAL RAY RESECTION (Left) as a surgical intervention.  The patient's history has been reviewed, patient examined, no change in status, stable for surgery.  I have reviewed the patient's chart and labs.  Questions were answered to the patient's satisfaction.     Ricci Barker

## 2023-05-08 NOTE — Progress Notes (Addendum)
Progress Note    [name]  ZOX:[id number] DOB: 1956-04-15  DOA: 05/05/2023 PCP: Bradley Schwalbe, MD      Brief Narrative:    Medical records reviewed and are as summarized below:  [name] is a 67 y.o. male with medical history significant for type 2 diabetes mellitus, hypertension, hyperlipidemia, gout, chronic anemia, alcohol use disorder, psoriasis, Schatzki's ring, arthritis, neuropathy, who was referred from Dr. Dory Larsen office (podiatrist) because of left foot infection.  He said he has had a wound on both feet.  However, he has been dealing with infection of the left foot for some time now and has been on oral antibiotics on and off for about 4 weeks.  He reported drainage from the left foot wound and noticed increasing redness.  He also complained of nausea and fatigue.  Workup revealed osteomyelitis of the left fifth MTP joint with septic arthritis.    Assessment/Plan:   Principal Problem:   Septic joint (HCC) Active Problems:   DM (diabetes mellitus) type II controlled, neurological manifestation (HCC)   Hyperlipemia   GERD   Symptomatic anemia   Alcohol use   Osteomyelitis of fifth toe of left foot (HCC)    Body mass index is 29.98 kg/m.   Septic arthritis of left fifth MTP joint and osteomyelitis of fifth metatarsal and fifth proximal phalanx: Continue empiric IV antibiotics (ceftriaxone, metronidazole and vancomycin).  Plan for left fifth toe amputation today.   Peripheral artery disease: S/p angiogram which was significant for occlusion in the mid to distal segment of the left anterior tibial artery, transluminal angioplasty of the left anterior tibial artery on 05/07/2023.  Follow-up with vascular surgeon..   Alcohol use disorder: Discontinue Librium. He refused to take it.  Benzodiazepines as needed per CIWA protocol.   Type II DM: He is NPO.  Hold insulin glargine today..  NovoLog as needed for hyperglycemia.  Adjust insulin therapy  based on glucose levels. He takes insulin glargine 15 units daily at home.  Hemoglobin A1c was 5.9 on 12/20/2022.   Hypokalemia and hypomagnesemia: Repleted on 05/07/2023.  Repeat labs tomorrow.   Hyponatremia: Asymptomatic.  Monitor electrolytes.   Chronic anemia: he follows with Dr. Donneta Romberg as an outpatient.   Comorbidities include hypertension, GERD, hyperlipidemia  Diet Order             Diet NPO time specified  Diet effective midnight                            Consultants: Vascular surgeon Podiatrist  Procedures: Angiogram with left anterior tibial artery angioplasty on 05/07/2023    Medications:    [MAR Hold] amLODipine  5 mg Oral Daily   [MAR Hold] vitamin C  500 mg Oral BID   [MAR Hold] chlordiazePOXIDE  25 mg Oral TID   [MAR Hold] DULoxetine  60 mg Oral Daily   [MAR Hold] enoxaparin (LOVENOX) injection  40 mg Subcutaneous Q24H   [MAR Hold] feeding supplement  237 mL Oral QHS   [MAR Hold] finasteride  5 mg Oral Daily   [MAR Hold] folic acid  1 mg Oral Daily   [MAR Hold] insulin aspart  0-15 Units Subcutaneous TID WC   [MAR Hold] insulin aspart  0-5 Units Subcutaneous QHS   [MAR Hold] insulin glargine-yfgn  10 Units Subcutaneous Daily   [MAR Hold] losartan  25 mg Oral Daily   [MAR Hold] multivitamin with minerals  1  tablet Oral Daily   [MAR Hold] nutrition supplement (JUVEN)  1 packet Oral BID BM   [MAR Hold] simvastatin  40 mg Oral QHS   [MAR Hold] tamsulosin  0.4 mg Oral QHS   [MAR Hold] thiamine  100 mg Oral Daily   Or   [MAR Hold] thiamine  100 mg Intravenous Daily   [MAR Hold] zinc sulfate  220 mg Oral Daily   Continuous Infusions:  [MAR Hold] cefTRIAXone (ROCEPHIN)  IV 2 g (05/08/23 1016)   And   [MAR Hold] metronidazole 500 mg (05/08/23 0813)   [MAR Hold] vancomycin 166.7 mL/hr at 05/08/23 0519     Anti-infectives (From admission, onward)    Start     Dose/Rate Route Frequency Ordered Stop   05/07/23 1154  ceFAZolin  (ANCEF) IVPB 2g/100 mL premix  Status:  Discontinued        2 g 200 mL/hr over 30 Minutes Intravenous 30 min pre-op 05/07/23 1154 05/07/23 1247   05/06/23 1000  [MAR Hold]  cefTRIAXone (ROCEPHIN) 2 g in sodium chloride 0.9 % 100 mL IVPB        (MAR Hold since Tue 05/08/2023 at 1156.Hold Reason: Transfer to a Procedural area)  Placed in "And" Linked Group   2 g 200 mL/hr over 30 Minutes Intravenous Every 24 hours 05/05/23 1545 05/13/23 0959   05/06/23 0500  [MAR Hold]  vancomycin (VANCOREADY) IVPB 1250 mg/250 mL        (MAR Hold since Tue 05/08/2023 at 1156.Hold Reason: Transfer to a Procedural area)   1,250 mg 166.7 mL/hr over 90 Minutes Intravenous Every 12 hours 05/05/23 1554     05/05/23 1600  [MAR Hold]  metroNIDAZOLE (FLAGYL) IVPB 500 mg        (MAR Hold since Tue 05/08/2023 at 1156.Hold Reason: Transfer to a Procedural area)  Placed in "And" Linked Group   500 mg 100 mL/hr over 60 Minutes Intravenous Every 8 hours 05/05/23 1545 05/12/23 1544   05/05/23 1445  vancomycin (VANCOCIN) injection 1,950 mg  Status:  Discontinued        20 mg/kg  97.5 kg Intravenous  Once 05/05/23 1439 05/05/23 1441   05/05/23 1445  cefTRIAXone (ROCEPHIN) 2 g in sodium chloride 0.9 % 100 mL IVPB        2 g 200 mL/hr over 30 Minutes Intravenous  Once 05/05/23 1439 05/05/23 1554   05/05/23 1445  vancomycin (VANCOREADY) IVPB 2000 mg/400 mL        2,000 mg 200 mL/hr over 120 Minutes Intravenous  Once 05/05/23 1441 05/05/23 1913              Family Communication/Anticipated D/C date and plan/Code Status   DVT prophylaxis: enoxaparin (LOVENOX) injection 40 mg Start: 05/05/23 2100     Code Status: Full Code  Family Communication: None Disposition Plan: Plan to discharge home   Status is: Inpatient Remains inpatient appropriate because: Left foot infection       Subjective:   Interval events noted.  He has no complaints.  No pain in the left foot.  Objective:    Vitals:   05/07/23  1445 05/07/23 2352 05/08/23 0740 05/08/23 1152  BP: (!) 152/89 (!) 150/83 133/72 127/75  Pulse: 87 88 84 99  Resp: 14 16 14 18   Temp:  98.6 F (37 C) 98.2 F (36.8 C) 98 F (36.7 C)  TempSrc:  Oral    SpO2: 94% 98% 98% 96%  Weight:    98.9 kg  Height:  5' 11.5" (1.816 m)   No data found.   Intake/Output Summary (Last 24 hours) at 05/08/2023 1208 Last data filed at 05/07/2023 1700 Gross per 24 hour  Intake 24 ml  Output --  Net 24 ml   Filed Weights   05/05/23 1200 05/08/23 1152  Weight: 97.5 kg 98.9 kg    Exam:  GEN: NAD SKIN: Warm and dry EYES: EOMI ENT: MMM CV: RRR PULM: CTA B ABD: soft, ND, NT, +BS CNS: AAO x 3, non focal EXT: No edema or tenderness.  Dressing on left foot is clean, dry and intact   Data Reviewed:   I have personally reviewed following labs and imaging studies:  Labs: Labs show the following:   Basic Metabolic Panel: Recent Labs  Lab 05/05/23 1201 05/07/23 0442  NA 127* 131*  K 3.3* 3.4*  CL 83* 88*  CO2 29 30  GLUCOSE 256* 178*  BUN 10 8  CREATININE 0.92 0.90  CALCIUM 9.0 8.9  MG 1.5* 1.4*  PHOS  --  3.7   GFR Estimated Creatinine Clearance: 96.3 mL/min (by C-G formula based on SCr of 0.9 mg/dL). Liver Function Tests: Recent Labs  Lab 05/05/23 1201  AST 20  ALT 11  ALKPHOS 86  BILITOT 1.1  PROT 7.3  ALBUMIN 3.8   No results for input(s): "LIPASE", "AMYLASE" in the last 168 hours. No results for input(s): "AMMONIA" in the last 168 hours. Coagulation profile No results for input(s): "INR", "PROTIME" in the last 168 hours.  CBC: Recent Labs  Lab 05/05/23 1201  WBC 5.2  NEUTROABS 3.5  HGB 10.8*  HCT 31.8*  MCV 97.5  PLT 352   Cardiac Enzymes: No results for input(s): "CKTOTAL", "CKMB", "CKMBINDEX", "TROPONINI" in the last 168 hours. BNP (last 3 results) No results for input(s): "PROBNP" in the last 8760 hours. CBG: Recent Labs  Lab 05/07/23 1845 05/07/23 2145 05/08/23 0742 05/08/23 0814  05/08/23 1125  GLUCAP 268* 220* 172* 175* 127*   D-Dimer: No results for input(s): "DDIMER" in the last 72 hours. Hgb A1c: No results for input(s): "HGBA1C" in the last 72 hours. Lipid Profile: No results for input(s): "CHOL", "HDL", "LDLCALC", "TRIG", "CHOLHDL", "LDLDIRECT" in the last 72 hours. Thyroid function studies: No results for input(s): "TSH", "T4TOTAL", "T3FREE", "THYROIDAB" in the last 72 hours.  Invalid input(s): "FREET3" Anemia work up: No results for input(s): "VITAMINB12", "FOLATE", "FERRITIN", "TIBC", "IRON", "RETICCTPCT" in the last 72 hours. Sepsis Labs: Recent Labs  Lab 05/05/23 1201  WBC 5.2  LATICACIDVEN 1.6    Microbiology Recent Results (from the past 240 hour(s))  Aerobic/Anaerobic Culture w Gram Stain (surgical/deep wound)     Status: None   Collection Time: 05/03/23  9:59 AM   Specimen: Wound  Result Value Ref Range Status   Specimen Description   Final    WOUND Performed at [facility], [address] [place], [place] [address]    Special Requests   Final    NONE Performed at [facility], [address] [place], [place] [address]    Gram Stain   Final    RARE WBC PRESENT, PREDOMINANTLY PMN NO ORGANISMS SEEN    Culture   Final    RARE PSEUDOMONAS AERUGINOSA MODERATE CORYNEBACTERIUM STRIATUM Standardized susceptibility testing for this organism is not available. NO ANAEROBES ISOLATED Performed at [facility], [address], [place], [place] [address]    Report Status 05/08/2023 FINAL  Final   Organism ID, Bacteria PSEUDOMONAS AERUGINOSA  Final  Susceptibility   Pseudomonas aeruginosa - MIC*    CEFTAZIDIME 4 SENSITIVE Sensitive     CIPROFLOXACIN <=0.25 SENSITIVE Sensitive     GENTAMICIN 2 SENSITIVE Sensitive     IMIPENEM 2 SENSITIVE Sensitive     PIP/TAZO 8 SENSITIVE Sensitive ug/mL    CEFEPIME 4 SENSITIVE Sensitive     * RARE PSEUDOMONAS AERUGINOSA  Culture, blood (Routine X 2) w Reflex to ID  Panel     Status: None (Preliminary result)   Collection Time: 05/05/23  4:38 PM   Specimen: BLOOD LEFT ARM  Result Value Ref Range Status   Specimen Description BLOOD LEFT ARM  Final   Special Requests   Final    BOTTLES DRAWN AEROBIC AND ANAEROBIC Blood Culture results may not be optimal due to an excessive volume of blood received in culture bottles   Culture   Final    NO GROWTH 3 DAYS Performed at [facility], [address], [place], [place] [address]    Report Status PENDING  Incomplete  Culture, blood (Routine X 2) w Reflex to ID Panel     Status: None (Preliminary result)   Collection Time: 05/05/23  4:38 PM   Specimen: BLOOD RIGHT ARM  Result Value Ref Range Status   Specimen Description BLOOD RIGHT ARM  Final   Special Requests   Final    BOTTLES DRAWN AEROBIC AND ANAEROBIC Blood Culture results may not be optimal due to an excessive volume of blood received in culture bottles   Culture   Final    NO GROWTH 3 DAYS Performed at [facility], [address] [place], [place] [address]    Report Status PENDING  Incomplete    Procedures and diagnostic studies:  PERIPHERAL VASCULAR CATHETERIZATION  Result Date: 05/07/2023 See surgical note for result.  US ARTERIAL ABI (SCREENING LOWER EXTREMITY)  Result Date: 05/07/2023 CLINICAL DATA:  Peripheral arterial disease Osteomyelitis EXAM: NONINVASIVE PHYSIOLOGIC VASCULAR STUDY OF BILATERAL LOWER EXTREMITIES TECHNIQUE: Evaluation of both lower extremities were performed at rest, including calculation of ankle-brachial indices with single level pressure measurements and doppler recording. COMPARISON:  None available. FINDINGS: Right ABI:  1.18 Left ABI:  1.21 Right Lower Extremity:  Normal arterial waveforms at the ankle. Left Lower Extremity:  Normal arterial waveforms at the ankle. 1.0-1.4 Normal IMPRESSION: Normal ankle-brachial indices Electronically Signed   By: Mauri Reading  Mir M.D.   On: 05/07/2023 11:32                LOS: 3 days   Tremayne Sheldon  [facility]   Pager on [email or web address]. If 7PM-7AM, please contact night-coverage at [email or web address]     05/08/2023, 12:08 PM

## 2023-05-08 NOTE — Op Note (Signed)
Date of operation: 05/08/2023.  Surgeon: Ricci Barker D.P.M.  Preoperative diagnosis: Septic joint with osteomyelitis left fifth ray.  Postoperative diagnosis: Same.  Procedure: Amputation left fifth toe with partial ray resection.  Anesthesia: Local MAC.  Hemostasis: None.  Estimated blood loss: 50 to 75 cc.  Pathology: Left fifth ray.  Cultures: Bone cultures left fifth metatarsal head.  Implants: Stimulan rapid cure antibiotic beads impregnated with vancomycin and gentamicin.  Complications: None apparent.  Operative indications: This is a 67 year old diabetic male with history of chronic ulceration beneath his left forefoot.  Recently the ulcer progressed down to the level of the joint, nonresponsive to outpatient antibiotics and was admitted and diagnosed with septic joint and osteomyelitis per MRI.  Operative procedure: Patient was taken to the operating room and placed on the table in the supine position.  Following satisfactory sedation the left forefoot was anesthetized with 16 cc of 0.5% Marcaine plain.  The foot was then prepped and draped in the usual sterile fashion.  Attention was directed to the dorsal aspect of the left foot where an incision was made from proximal to distal over the fifth metatarsal shaft and then around the medial and lateral base of the fifth toe.  Incision was deepened via sharp and blunt dissection down to the level of the bone where dissection was carried back proximally to the fifth metatarsal base area.  Using a sagittal saw the fifth metatarsal was transected and the fifth ray was removed in toto.  Plantar ulceration and capsular tissue was then excisionally debrided sharply using a rongeur.  The wound was then flushed with copious amounts of sterile saline.  Stimulan rapid cure antibiotic beads impregnated with vancomycin and gentamicin were then placed within the wound and the incision was closed using 3-0 nylon simple interrupted sutures.   Xeroform applied to the incision followed by a bulky sterile bandage with 4 x 4's ABDs Kerlix and Ace wrap.  Patient was awakened and transported to the PACU having tolerated the anesthesia and procedure well.

## 2023-05-08 NOTE — Consult Note (Signed)
Pharmacy Antibiotic Note  [name] is a 67 y.o. male admitted on 05/05/2023 with DFI with concern for osteomyelitis.  Pharmacy has been consulted for vancomycin dosing.  Low extremity angio performed 10/14 at 1200. 5th toe amputation scheduled for today 10/15 ~ noon.  Plan: Day#4 of Abx ; 6 doses of Vancomycin received Vancomycin level done 10.5 hrs after 5th Vancomycin dose resulted at 19. Renal function stable at 0.90 (BL ~ 1). Will continue current vancomycin 1250mg  q 12hrs regimen and follow duration after surgery. Ceftriaxone 2 grams IV every 24 hours per provider Flagyl 500 mg IV every 12 hours per provider Follow renal function for dose adjustments   Height: 5\' 11"  (180.3 cm) Weight: 97.5 kg (215 lb) IBW/kg (Calculated) : 75.3  Temp (24hrs), Avg:98.4 F (36.9 C), Min:98.2 F (36.8 C), Max:98.6 F (37 C)  Recent Labs  Lab 05/05/23 1201 05/07/23 0442 05/08/23 0520  WBC 5.2  --   --   CREATININE 0.92 0.90  --   LATICACIDVEN 1.6  --   --   VANCORANDOM  --   --  19    Estimated Creatinine Clearance: 94.9 mL/min (by C-G formula based on SCr of 0.9 mg/dL).    Allergies  Allergen Reactions   Glipizide Other (See Comments)    REACTION: wt. gain, hands tingling   Lisinopril Other (See Comments)    HYPERKALEMIA   Ramipril Cough   Ibuprofen     UNSPECIFIED REACTION    Bactrim [Sulfamethoxazole-Trimethoprim] Nausea And Vomiting and Rash    Antimicrobials this admission: vancomycin 10/12 >>  ceftriaxone 10/12 >>  Flagyl 10/12 >>  Dose adjustments this admission: N/A  Microbiology results: 10/12 bcx: NGTD  Thank you for allowing pharmacy to be a part of this patient's care.  Yassine Brunsman Rodriguez-Guzman PharmD, BCPS 05/08/2023 8:11 AM

## 2023-05-09 ENCOUNTER — Encounter: Payer: Self-pay | Admitting: Podiatry

## 2023-05-09 DIAGNOSIS — M86172 Other acute osteomyelitis, left ankle and foot: Secondary | ICD-10-CM | POA: Diagnosis not present

## 2023-05-09 DIAGNOSIS — E11628 Type 2 diabetes mellitus with other skin complications: Secondary | ICD-10-CM | POA: Diagnosis not present

## 2023-05-09 DIAGNOSIS — Z789 Other specified health status: Secondary | ICD-10-CM

## 2023-05-09 DIAGNOSIS — M869 Osteomyelitis, unspecified: Secondary | ICD-10-CM | POA: Diagnosis not present

## 2023-05-09 DIAGNOSIS — D649 Anemia, unspecified: Secondary | ICD-10-CM

## 2023-05-09 DIAGNOSIS — L089 Local infection of the skin and subcutaneous tissue, unspecified: Secondary | ICD-10-CM | POA: Diagnosis not present

## 2023-05-09 DIAGNOSIS — I739 Peripheral vascular disease, unspecified: Secondary | ICD-10-CM | POA: Diagnosis not present

## 2023-05-09 DIAGNOSIS — E1165 Type 2 diabetes mellitus with hyperglycemia: Secondary | ICD-10-CM

## 2023-05-09 LAB — CBC
HCT: 26.4 % — ABNORMAL LOW (ref 39.0–52.0)
Hemoglobin: 8.9 g/dL — ABNORMAL LOW (ref 13.0–17.0)
MCH: 32.8 pg (ref 26.0–34.0)
MCHC: 33.7 g/dL (ref 30.0–36.0)
MCV: 97.4 fL (ref 80.0–100.0)
Platelets: 275 10*3/uL (ref 150–400)
RBC: 2.71 MIL/uL — ABNORMAL LOW (ref 4.22–5.81)
RDW: 13.3 % (ref 11.5–15.5)
WBC: 4.9 10*3/uL (ref 4.0–10.5)
nRBC: 0 % (ref 0.0–0.2)

## 2023-05-09 LAB — BASIC METABOLIC PANEL
Anion gap: 8 (ref 5–15)
BUN: 13 mg/dL (ref 8–23)
CO2: 25 mmol/L (ref 22–32)
Calcium: 8.2 mg/dL — ABNORMAL LOW (ref 8.9–10.3)
Chloride: 94 mmol/L — ABNORMAL LOW (ref 98–111)
Creatinine, Ser: 0.9 mg/dL (ref 0.61–1.24)
GFR, Estimated: >60 mL/min (ref >60)
Glucose, Bld: 209 mg/dL — ABNORMAL HIGH (ref 70–99)
Potassium: 3.4 mmol/L — ABNORMAL LOW (ref 3.5–5.1)
Sodium: 127 mmol/L — ABNORMAL LOW (ref 135–145)

## 2023-05-09 LAB — GLUCOSE, CAPILLARY
Glucose-Capillary: 207 mg/dL — ABNORMAL HIGH (ref 70–99)
Glucose-Capillary: 322 mg/dL — ABNORMAL HIGH (ref 70–99)
Glucose-Capillary: 203 mg/dL — ABNORMAL HIGH (ref 70–99)
Glucose-Capillary: 219 mg/dL — ABNORMAL HIGH (ref 70–99)

## 2023-05-09 LAB — MAGNESIUM: Magnesium: 1.3 mg/dL — ABNORMAL LOW (ref 1.7–2.4)

## 2023-05-09 MED ORDER — VANCOMYCIN HCL IN DEXTROSE 1-5 GM/200ML-% IV SOLN
1000.0000 mg | Freq: Two times a day (BID) | INTRAVENOUS | Status: DC
  Filled 2023-05-09: qty 200

## 2023-05-09 NOTE — Progress Notes (Signed)
Progress Note   Patient: [name] GEX:[id number] DOB: 08/11/1955 DOA: 05/05/2023     4 DOS: the patient was seen and examined on 05/09/2023   Brief hospital course: No notes on file  Assessment and Plan: * Septic joint (HCC) Left fifth toe redness and swelling concerning for osteomyelitis. MRI left foot reviewed shows osteo of fifth metatarsal. Status post left fifth toe, partial ray resection 05/08/2023. ID evaluation appreciated, Rocephin changed to cefepime as culture grew Pseudomonas.  Follow culture results and pathology reports. Continue Flagyl vancomycin for infectious coverage Follow-up podiatry recommendations. Possible discharge tomorrow on Cipro per ID.  Peripheral artery disease: Status post Aortogram and selective left lower extremity angiogram with angioplasty of the left tibial artery 05/07/2023.  Vascular surgery signed off.  Hypokalemia Hypomagnesemia Repleted.  Monitor daily electrolytes  Alcohol use disorder Discussed importance of cessation  CIWA protocol  Watch for withdrawal symptoms  Symptomatic anemia Hemoglobin 8.9 today. Followed by Dr. Donneta Romberg outpatient  GERD Continue PPI  Hyperlipemia Continue statin  DM (diabetes mellitus) type II controlled, neurological manifestation (HCC) A1c 5.9-5 months ago. Blood sugars elevated, Semglee 10 units daily.  Continue Accu-Cheks, sliding scale insulin.    Out of bed to chair. Incentive spirometry. Nursing supportive care. Fall, aspiration precautions. DVT prophylaxis   Code Status: Full Code  Subjective: Patient is seen and examined today morning.  He is sitting in chair, denies any pain.  Did not get out of bed.  Eating fair.  Physical Exam: Vitals:   05/08/23 2334 05/09/23 0426 05/09/23 0734 05/09/23 1518  BP: (!) 162/86 137/81 136/70 133/80  Pulse: 84 84 81 93  Resp: 18 16 14 14   Temp: 98.4 F (36.9 C) 98.3 F (36.8 C) 98.9 F (37.2 C) 98.1 F (36.7 C)  TempSrc:      SpO2:  95% 96% 93% 100%  Weight:      Height:        General - Elderly Caucasian male, no apparent distress HEENT - PERRLA, EOMI, atraumatic head, non tender sinuses. Lung - Clear, basal rales, no rhonchi, wheezes. Heart - S1, S2 heard, no murmurs, rubs, trace pedal edema. Abdomen - Soft, non tender nondistended, bowel sounds good Neuro - Alert, awake and oriented x 3, non focal exam. Skin - Warm and dry.  Left foot dressing noted  Data Reviewed:      Latest Ref Rng & Units 05/09/2023    4:45 AM 05/05/2023   12:01 PM 04/27/2023    7:48 AM  CBC  WBC 4.0 - 10.5 K/uL 4.9  5.2  6.6   Hemoglobin 13.0 - 17.0 g/dL 8.9  01.0  27.2   Hematocrit 39.0 - 52.0 % 26.4  31.8  30.4   Platelets 150 - 400 K/uL 275  352  318       Latest Ref Rng & Units 05/09/2023    4:45 AM 05/07/2023    4:42 AM 05/05/2023   12:01 PM  BMP  Glucose 70 - 99 mg/dL 536  644  034   BUN 8 - 23 mg/dL 13  8  10    Creatinine 0.61 - 1.24 mg/dL 7.42  5.95  6.38   Sodium 135 - 145 mmol/L 127  131  127   Potassium 3.5 - 5.1 mmol/L 3.4  3.4  3.3   Chloride 98 - 111 mmol/L 94  88  83   CO2 22 - 32 mmol/L 25  30  29    Calcium 8.9 - 10.3 mg/dL 8.2  8.9  9.0    No results found.   Family Communication: Discussed with patient, he understands and agrees. All questions answereed.  Disposition: Status is: Inpatient Remains inpatient appropriate because: IV antibiotic coverage post amputation  Planned Discharge Destination: Home with Home Health     MDM level 3 - Patient had left fifth toe with partial ray resection.  Patient is on broad-spectrum antibiotic regimen at this time awaiting cultures.  Patient will need close monitoring of vancomycin levels, kidney function, pending culture and pathology results.  Author: Marcelino Duster, MD 05/09/2023 4:44 PM Secure chat 7am to 7pm For on call review [email or web address].

## 2023-05-09 NOTE — Progress Notes (Signed)
Date of Admission:  05/05/2023    ID: [name] is a 67 y.o. male Principal Problem:   Septic joint (HCC) Active Problems:   DM (diabetes mellitus) type II controlled, neurological manifestation (HCC)   Hyperlipemia   GERD   Symptomatic anemia   Alcohol use   Osteomyelitis of fifth toe of left foot (HCC)    Subjective: Doing better No pain No swelling  Medications:   amLODipine  5 mg Oral Daily   vitamin C  500 mg Oral BID   DULoxetine  60 mg Oral Daily   enoxaparin (LOVENOX) injection  40 mg Subcutaneous Q24H   feeding supplement  237 mL Oral QHS   finasteride  5 mg Oral Daily   folic acid  1 mg Oral Daily   insulin aspart  0-15 Units Subcutaneous TID WC   insulin aspart  0-5 Units Subcutaneous QHS   insulin glargine-yfgn  10 Units Subcutaneous Daily   losartan  25 mg Oral Daily   multivitamin with minerals  1 tablet Oral Daily   nutrition supplement (JUVEN)  1 packet Oral BID BM   simvastatin  40 mg Oral QHS   tamsulosin  0.4 mg Oral QHS   thiamine  100 mg Oral Daily   Or   thiamine  100 mg Intravenous Daily   zinc sulfate  220 mg Oral Daily    Objective: Vital signs in last 24 hours: Patient Vitals for the past 24 hrs:  BP Temp Temp src Pulse Resp SpO2  05/09/23 0734 136/70 98.9 F (37.2 C) -- 81 14 93 %  05/09/23 0426 137/81 98.3 F (36.8 C) -- 84 16 96 %  05/08/23 2334 (!) 162/86 98.4 F (36.9 C) -- 84 18 95 %  05/08/23 1501 (!) 149/84 98 F (36.7 C) Oral 89 18 97 %  05/08/23 1416 -- -- -- 91 18 93 %  05/08/23 1400 (!) 164/96 -- -- 95 18 95 %  05/08/23 1345 (!) 143/82 -- -- 85 16 97 %  05/08/23 1333 129/80 97.6 F (36.4 C) -- 92 15 96 %    PHYSICAL EXAM:  General: Alert, cooperative, no distress, appears stated age.  Head: Normocephalic, without obvious abnormality, atraumatic. Lungs: Clear to auscultation bilaterally. No Wheezing or Rhonchi. No rales. Heart: Regular rate and rhythm, no murmur, rub or gallop. Abdomen: Soft, non-tender,not  distended. Bowel sounds normal. No masses Extremities: foot Surgical site well approximated No erythema or swelling 5th toe amputation      Skin: No rashes or lesions. Or bruising Lymph: Cervical, supraclavicular normal. Neurologic: Grossly non-focal  Lab Results    Latest Ref Rng & Units 05/09/2023    4:45 AM 05/05/2023   12:01 PM 04/27/2023    7:48 AM  CBC  WBC 4.0 - 10.5 K/uL 4.9  5.2  6.6   Hemoglobin 13.0 - 17.0 g/dL 8.9  98.1  19.1   Hematocrit 39.0 - 52.0 % 26.4  31.8  30.4   Platelets 150 - 400 K/uL 275  352  318        Latest Ref Rng & Units 05/09/2023    4:45 AM 05/07/2023    4:42 AM 05/05/2023   12:01 PM  CMP  Glucose 70 - 99 mg/dL 478  295  621   BUN 8 - 23 mg/dL 13  8  10    Creatinine 0.61 - 1.24 mg/dL 3.08  6.57  8.46   Sodium 135 - 145 mmol/L 127  131  127  Potassium 3.5 - 5.1 mmol/L 3.4  3.4  3.3   Chloride 98 - 111 mmol/L 94  88  83   CO2 22 - 32 mmol/L 25  30  29    Calcium 8.9 - 10.3 mg/dL 8.2  8.9  9.0   Total Protein 6.5 - 8.1 g/dL   7.3   Total Bilirubin 0.3 - 1.2 mg/dL   1.1   Alkaline Phos 38 - 126 U/L   86   AST 15 - 41 U/L   20   ALT 0 - 44 U/L   11       Microbiology: Wound culture superficial pseudomonas Surgical bone culture so far no growth so far  Assessment/Plan: DM with PAD with infected would left foot causing osteomyelitis of the 5th toe S/p partial ray amputation of 5th toe On cefepime, vanco flagyl DC vanco May be able to do cipro and augmentin on discharge for 10-14 days    PAD s/p angip to left ATA   Peripheral neuropathy   HTN on losartana nd amlodipine   Psoriasis on Taltz- some immune compromised state?   Anemia- recent BM biopsy was Okay ? Discussed the management with patient, pt seen with Dr.Cline

## 2023-05-09 NOTE — Progress Notes (Signed)
1 Day Post-Op   Subjective/Chief Complaint: Patient seen.  Denies any pain   Objective: Vital signs in last 24 hours: Temp:  [97.6 F (36.4 C)-98.9 F (37.2 C)] 98.9 F (37.2 C) (10/16 0734) Pulse Rate:  [81-95] 81 (10/16 0734) Resp:  [14-18] 14 (10/16 0734) BP: (129-164)/(70-96) 136/70 (10/16 0734) SpO2:  [93 %-97 %] 93 % (10/16 0734) Last BM Date : 05/07/23  Intake/Output from previous day: 10/15 0701 - 10/16 0700 In: 2491.2 [P.O.:840; I.V.:300; IV Piggyback:1351.2] Out: 5 [Blood:5] Intake/Output this shift: No intake/output data recorded.  Some strikethrough is noted on the bandaging and Ace wrap.  Upon removal fairly heavy bleeding noted on the bandaging.  Incision is well coapted with skin edges viable.  No signs of any purulence noted.  Erythema and edema somewhat improved.     Lab Results:  Recent Labs    05/09/23 0445  WBC 4.9  HGB 8.9*  HCT 26.4*  PLT 275   BMET Recent Labs    05/07/23 0442 05/09/23 0445  NA 131* 127*  K 3.4* 3.4*  CL 88* 94*  CO2 30 25  GLUCOSE 178* 209*  BUN 8 13  CREATININE 0.90 0.90  CALCIUM 8.9 8.2*   PT/INR No results for input(s): "LABPROT", "INR" in the last 72 hours. ABG No results for input(s): "PHART", "HCO3" in the last 72 hours.  Invalid input(s): "PCO2", "PO2"  Studies/Results: PERIPHERAL VASCULAR CATHETERIZATION  Result Date: 05/07/2023 See surgical note for result.   Anti-infectives: Anti-infectives (From admission, onward)    Start     Dose/Rate Route Frequency Ordered Stop   05/09/23 1700  vancomycin (VANCOCIN) IVPB 1000 mg/200 mL premix  Status:  Discontinued        1,000 mg 200 mL/hr over 60 Minutes Intravenous Every 12 hours 05/09/23 0941 05/09/23 1234   05/08/23 1800  ceFEPIme (MAXIPIME) 2 g in sodium chloride 0.9 % 100 mL IVPB        2 g 200 mL/hr over 30 Minutes Intravenous Every 8 hours 05/08/23 1617     05/07/23 1154  ceFAZolin (ANCEF) IVPB 2g/100 mL premix  Status:  Discontinued        2  g 200 mL/hr over 30 Minutes Intravenous 30 min pre-op 05/07/23 1154 05/07/23 1247   05/06/23 1000  cefTRIAXone (ROCEPHIN) 2 g in sodium chloride 0.9 % 100 mL IVPB  Status:  Discontinued       Placed in "And" Linked Group   2 g 200 mL/hr over 30 Minutes Intravenous Every 24 hours 05/05/23 1545 05/08/23 1617   05/06/23 0500  vancomycin (VANCOREADY) IVPB 1250 mg/250 mL  Status:  Discontinued        1,250 mg 166.7 mL/hr over 90 Minutes Intravenous Every 12 hours 05/05/23 1554 05/09/23 0941   05/05/23 1600  metroNIDAZOLE (FLAGYL) IVPB 500 mg       Placed in "And" Linked Group   500 mg 100 mL/hr over 60 Minutes Intravenous Every 8 hours 05/05/23 1545 05/12/23 1544   05/05/23 1445  vancomycin (VANCOCIN) injection 1,950 mg  Status:  Discontinued        20 mg/kg  97.5 kg Intravenous  Once 05/05/23 1439 05/05/23 1441   05/05/23 1445  cefTRIAXone (ROCEPHIN) 2 g in sodium chloride 0.9 % 100 mL IVPB        2 g 200 mL/hr over 30 Minutes Intravenous  Once 05/05/23 1439 05/05/23 1554   05/05/23 1445  vancomycin (VANCOREADY) IVPB 2000 mg/400 mL  2,000 mg 200 mL/hr over 120 Minutes Intravenous  Once 05/05/23 1441 05/05/23 1913       Assessment/Plan: s/p Procedure(s): AMPUTATION 5TH TOE WITH PARTIAL RAY RESECTION (Left) Assessment: Stable status post fifth ray resection left foot.  Plan: Betadine gauze and sterile dressing reapplied to the left foot.  Spoke to the patient's wife on the phone.  Has some concerns about him being unable to keep his forefoot off the ground putting pressure only on the heel.  We will order a cam boot to allow the patient more stabilization and ability to keep the pressure only on the heel.  I will also give him a prescription for a rolling knee scooter.  Infectious disease present at today's visit.  Feels comfortable sending him home on oral antibiotics.  Patient will keep the bandage clean, dry, and do not remove.  Plan for follow-up next Monday.  LOS: 4 days     Ricci Barker 05/09/2023

## 2023-05-09 NOTE — Consult Note (Addendum)
Pharmacy Antibiotic Note  [name] is a 67 y.o. male admitted on 05/05/2023 with  osteomyelitis . History significant for PAD with infected L foot; osteomyelitis of the 5th toe.  Pharmacy has been consulted for Vancomycin dosing.  Today, 05/09/2023 Day 5 of vancomycin  Renal Function at baseline: 0.9 WBC WNL Afebrile MRI L foot: Fifth MTP joint with osteomyelitis  10/10 Wound cultures: pseudomonas 10/15 Wound cultures: NG < 12 hours 10/15 Partial ray amputation of 5th toe 10/15 Pathology report pending   Plan: Adjust Vancomycin 1000 mg IV Q12H Trough 10/15: 19 mcg/mL @ 0520 Last dose 10/14 @ 1842 Trough probably slightly elevated due to late PM dose Estimated trough based on kinetics ~15 Continue to monitor renal function and follow cultures + pathology report  Other antibiotics 10/12 Metronidazole 500 mg IV Q8H  10/15 Cefepime 2g IV Q8H  Height: 5' 11.5" (181.6 cm) Weight: 98.9 kg (218 lb) IBW/kg (Calculated) : 76.45  Temp (24hrs), Avg:98.2 F (36.8 C), Min:97.6 F (36.4 C), Max:98.9 F (37.2 C)  Recent Labs  Lab 05/05/23 1201 05/07/23 0442 05/08/23 0520 05/09/23 0445  WBC 5.2  --   --  4.9  CREATININE 0.92 0.90  --  0.90  LATICACIDVEN 1.6  --   --   --   VANCORANDOM  --   --  19  --     Estimated Creatinine Clearance: 96.3 mL/min (by C-G formula based on SCr of 0.9 mg/dL).    Allergies  Allergen Reactions   Glipizide Other (See Comments)    REACTION: wt. gain, hands tingling   Lisinopril Other (See Comments)    HYPERKALEMIA   Ramipril Cough   Ibuprofen     UNSPECIFIED REACTION    Bactrim [Sulfamethoxazole-Trimethoprim] Nausea And Vomiting and Rash    Antimicrobials this admission: Cefepime 2g IV Q8H >>  Metronidazole 500 mg Q8H  >>   Dose adjustments this admission: 10/12 - 10/15: Ceftriaxone 2g Q24H 10/13 10/16: Vancomycin 1250 mg IV Q12H   Microbiology results: 10/10 Wound Cx: pseudomonas 10/12 BCx: NGTD  10/15 Wound Cx: NG < 12 hours    Thank you for allowing pharmacy to be a part of this patient's care.  Effie Shy 05/09/2023 9:43 AM

## 2023-05-09 NOTE — Plan of Care (Signed)

## 2023-05-09 NOTE — Anesthesia Postprocedure Evaluation (Signed)
Anesthesia Post Note  Patient: Cotton Beckley Spark  Procedure(s) Performed: AMPUTATION 5TH TOE WITH PARTIAL RAY RESECTION (Left: Toe)  Patient location during evaluation: PACU Anesthesia Type: General Level of consciousness: awake and alert Pain management: pain level controlled Vital Signs Assessment: post-procedure vital signs reviewed and stable Respiratory status: spontaneous breathing, nonlabored ventilation, respiratory function stable and patient connected to nasal cannula oxygen Cardiovascular status: blood pressure returned to baseline and stable Postop Assessment: no apparent nausea or vomiting Anesthetic complications: no   No notable events documented.   Last Vitals:  Vitals:   05/09/23 0426 05/09/23 0734  BP: 137/81 136/70  Pulse: 84 81  Resp: 16 14  Temp: 36.8 C 37.2 C  SpO2: 96% 93%    Last Pain:  Vitals:   05/08/23 2214  TempSrc:   PainSc: 0-No pain                 Corinda Gubler

## 2023-05-10 DIAGNOSIS — K219 Gastro-esophageal reflux disease without esophagitis: Secondary | ICD-10-CM

## 2023-05-10 DIAGNOSIS — E1140 Type 2 diabetes mellitus with diabetic neuropathy, unspecified: Secondary | ICD-10-CM | POA: Diagnosis not present

## 2023-05-10 DIAGNOSIS — Z789 Other specified health status: Secondary | ICD-10-CM | POA: Diagnosis not present

## 2023-05-10 DIAGNOSIS — M869 Osteomyelitis, unspecified: Secondary | ICD-10-CM | POA: Diagnosis not present

## 2023-05-10 DIAGNOSIS — E782 Mixed hyperlipidemia: Secondary | ICD-10-CM | POA: Diagnosis not present

## 2023-05-10 DIAGNOSIS — Z794 Long term (current) use of insulin: Secondary | ICD-10-CM

## 2023-05-10 LAB — BASIC METABOLIC PANEL
Anion gap: 9 (ref 5–15)
BUN: 16 mg/dL (ref 8–23)
CO2: 24 mmol/L (ref 22–32)
Calcium: 8.5 mg/dL — ABNORMAL LOW (ref 8.9–10.3)
Chloride: 97 mmol/L — ABNORMAL LOW (ref 98–111)
Creatinine, Ser: 0.9 mg/dL (ref 0.61–1.24)
GFR, Estimated: >60 mL/min (ref >60)
Glucose, Bld: 189 mg/dL — ABNORMAL HIGH (ref 70–99)
Potassium: 3.6 mmol/L (ref 3.5–5.1)
Sodium: 130 mmol/L — ABNORMAL LOW (ref 135–145)

## 2023-05-10 LAB — CULTURE, BLOOD (ROUTINE X 2)
Culture: NO GROWTH
Culture: NO GROWTH

## 2023-05-10 LAB — CBC
HCT: 27 % — ABNORMAL LOW (ref 39.0–52.0)
Hemoglobin: 8.9 g/dL — ABNORMAL LOW (ref 13.0–17.0)
MCH: 32.4 pg (ref 26.0–34.0)
MCHC: 33 g/dL (ref 30.0–36.0)
MCV: 98.2 fL (ref 80.0–100.0)
Platelets: 259 10*3/uL (ref 150–400)
RBC: 2.75 MIL/uL — ABNORMAL LOW (ref 4.22–5.81)
RDW: 13.3 % (ref 11.5–15.5)
WBC: 4.2 10*3/uL (ref 4.0–10.5)
nRBC: 0 % (ref 0.0–0.2)

## 2023-05-10 LAB — GLUCOSE, CAPILLARY
Glucose-Capillary: 184 mg/dL — ABNORMAL HIGH (ref 70–99)
Glucose-Capillary: 309 mg/dL — ABNORMAL HIGH (ref 70–99)

## 2023-05-10 MED ORDER — AMOXICILLIN-POT CLAVULANATE 875-125 MG PO TABS
1.0000 | ORAL_TABLET | Freq: Two times a day (BID) | ORAL | 0 refills | Status: AC
Start: 2023-05-10 — End: 2023-05-20

## 2023-05-10 MED ORDER — VITAMIN B-1 100 MG PO TABS: 100.0000 mg | ORAL_TABLET | Freq: Every day | ORAL | 0 refills | Status: AC

## 2023-05-10 MED ORDER — OXYCODONE-ACETAMINOPHEN 5-325 MG PO TABS: 1.0000 | ORAL_TABLET | Freq: Three times a day (TID) | ORAL | 0 refills | Status: DC | PRN

## 2023-05-10 MED ORDER — ENSURE ENLIVE PO LIQD: 237.0000 mL | Freq: Every day | ORAL | 2 refills | Status: DC

## 2023-05-10 MED ORDER — ZINC SULFATE 220 (50 ZN) MG PO CAPS: 220.0000 mg | ORAL_CAPSULE | Freq: Every day | ORAL | 0 refills | Status: AC

## 2023-05-10 MED ORDER — ADULT MULTIVITAMIN W/MINERALS CH: 1.0000 | ORAL_TABLET | Freq: Every day | ORAL | 2 refills | Status: AC

## 2023-05-10 MED ORDER — CIPROFLOXACIN HCL 500 MG PO TABS: 500.0000 mg | ORAL_TABLET | Freq: Two times a day (BID) | ORAL | 0 refills | Status: AC

## 2023-05-10 MED ORDER — ASCORBIC ACID 500 MG PO TABS: 500.0000 mg | ORAL_TABLET | Freq: Two times a day (BID) | ORAL | 0 refills | Status: AC

## 2023-05-10 MED ORDER — FOLIC ACID 1 MG PO TABS: 1.0000 mg | ORAL_TABLET | Freq: Every day | ORAL | 0 refills | Status: AC

## 2023-05-10 MED ORDER — JUVEN PO PACK: 1.0000 | PACK | Freq: Two times a day (BID) | ORAL | 0 refills | Status: DC

## 2023-05-10 NOTE — TOC Progression Note (Signed)
Transition of Care [facility]) - Progression Note    Patient Details  Name: [name] MRN: [id number] Date of Birth: 29-Jun-1956  [facility] CM/SW Contact  Marlowe Sax, RN Phone Number: 05/10/2023, 12:00 PM  Clinical Narrative:     Met with the patient in the room, he lives at home with his wif3, he has a follow up appointment on Monday and knows to not remove the bandage on his foot, he is up and moving around in the room independently, he has a cam boot on, he stated that he does not want HH and does not feel that he needs it, he has a script for a knee scooter  Expected Discharge Plan: Home/Self Care Barriers to Discharge: No Barriers Identified  Expected Discharge Plan and Services   Discharge Planning Services: CM Consult   Living arrangements for the past 2 months: Single Family Home Expected Discharge Date: 05/10/23               DME Arranged: N/A         HH Arranged: Patient Refused HH           Social Determinants of Health (SDOH) Interventions SDOH Screenings   Food Insecurity: No Food Insecurity (05/06/2023)  Housing: Low Risk  (05/06/2023)  Transportation Needs: No Transportation Needs (05/06/2023)  Utilities: Not At Risk (05/06/2023)  Depression (PHQ2-9): Low Risk  (12/20/2022)  Tobacco Use: Low Risk  (05/06/2023)    Readmission Risk Interventions     No data to display

## 2023-05-10 NOTE — Plan of Care (Signed)
  Problem: Health Behavior/Discharge Planning: Goal: Ability to manage health-related needs will improve Outcome: Progressing   

## 2023-05-10 NOTE — Discharge Summary (Signed)
Physician Discharge Summary   Patient: [name] MRN: [id number] DOB: [birth year]  Admit date:     05/05/2023  Discharge date: 05/10/23  Discharge Physician: Marcelino Duster   PCP: Karie Schwalbe, MD   Recommendations at discharge:    PCP follow up in 1 week. Podiatry follow up as scheduled.  Discharge Diagnoses: Principal Problem:   Septic joint (HCC) Active Problems:   DM (diabetes mellitus) type II controlled, neurological manifestation (HCC)   Hyperlipemia   GERD   Symptomatic anemia   Alcohol use   Osteomyelitis of fifth toe of left foot (HCC)  Resolved Problems:   * No resolved hospital problems. *  Hospital Course: [name] is a 67 y.o. male with medical history significant for type 2 diabetes mellitus, hypertension, hyperlipidemia, gout, chronic anemia, alcohol use disorder, psoriasis, Schatzki's ring, arthritis, neuropathy, who was referred from Dr. Dory Larsen office (podiatrist) because of left foot infection.  He said he has had a wound on both feet.  However, he has been dealing with infection of the left foot for some time now and has been on oral antibiotics on and off for about 4 weeks.  He reported drainage from the left foot wound and noticed increasing redness.  He also complained of nausea and fatigue.   Workup revealed osteomyelitis of the left fifth MTP joint with septic arthritis started on broad spectrum antibiotics. Seen by podiatry, vascular. He had left fifth toe, partial ray resection 05/08/2023. He is hemodynamiically stable to be discharged with oral cipro and augmentin for 10 days, PCP, podiatry follow up.  Assessment and Plan: * Septic joint (HCC) Left fifth toe osteomyelitis MRI left foot reviewed shows osteo of fifth metatarsal. S/p left fifth toe, partial ray resection 05/08/2023. He did receive rocephin+vanco+flagyl. Rocephin changed to cefepime due to pseudomonas wound cultures. ID follow up appreciated, culture results reviewed,  advised cipro and augmentin for 10 to 14 days.  Follow-up podiatry as outpatient.  Peripheral artery disease: Status post Aortogram and selective left lower extremity angiogram with angioplasty of the left tibial artery 05/07/2023.  Vascular surgery signed off as podiatry planned amputation surgery.   Hypokalemia Hypomagnesemia Repleted.  Follow up electrolytes outpatient.   Alcohol use disorder Discussed importance of cessation  No withdrawal symptoms   Chronic anemia Hemoglobin 8.9 today. Asymptomatic. Outpatient follow up with Dr. Donneta Romberg.   GERD Continue PPI   Hyperlipemia Continue statin   DM (diabetes mellitus) type II controlled, neurological manifestation (HCC) A1c 5.9-5 months ago. Continue home dose Lantus, metformin.       Consultants: Podiatry, vascular Procedures performed: left fifth toe, partial ray resection 05/08/2023.  Disposition: Home Diet recommendation:  Discharge Diet Orders (From admission, onward)     Start     Ordered   05/10/23 0000  Diet - low sodium heart healthy        05/10/23 1058           Cardiac and Carb modified diet DISCHARGE MEDICATION: Allergies as of 05/10/2023       Reactions   Glipizide Other (See Comments)   REACTION: wt. gain, hands tingling   Lisinopril Other (See Comments)   HYPERKALEMIA   Ramipril Cough   Ibuprofen    UNSPECIFIED REACTION    Bactrim [sulfamethoxazole-trimethoprim] Nausea And Vomiting, Rash        Medication List     STOP taking these medications    doxycycline 100 MG capsule Commonly known as: VIBRAMYCIN   indomethacin 25 MG capsule Commonly  known as: INDOCIN   meloxicam 15 MG tablet Commonly known as: MOBIC       TAKE these medications    Alpha-Lipoic Acid 600 MG Tabs Take by mouth. 2 in am and 1 in pm   amLODipine 5 MG tablet Commonly known as: NORVASC TAKE 1 TABLET(5 MG) BY MOUTH DAILY What changed: See the new instructions.   amoxicillin-clavulanate  875-125 MG tablet Commonly known as: AUGMENTIN Take 1 tablet by mouth 2 (two) times daily for 10 days.   ascorbic acid 500 MG tablet Commonly known as: VITAMIN C Take 1 tablet (500 mg total) by mouth 2 (two) times daily.   B-D UF III MINI PEN NEEDLES 31G X 5 MM Misc Generic drug: Insulin Pen Needle USE AS DIRECTED DAILY   BD SafetyGlide Syringe/Needle 25G X 1" 3 ML Misc Generic drug: SYRINGE-NEEDLE (DISP) 3 ML Use the needle for IM injection once a  month; or as directed.   calcipotriene 0.005 % ointment Commonly known as: DOVONOX Apply topically 2 (two) times daily.   CALCIUM-MAGNESIUM-ZINC PO Take by mouth.   Cinnamon 500 MG capsule Take 500 mg by mouth 2 (two) times daily.   ciprofloxacin 500 MG tablet Commonly known as: Cipro Take 1 tablet (500 mg total) by mouth 2 (two) times daily for 10 days.   clobetasol ointment 0.05 % Commonly known as: TEMOVATE Apply topically.   Contour Next Test test strip Generic drug: glucose blood USE 1 TO 2 TIMES DAILY TO  CHECK BLOOD SUGAR   cyanocobalamin 1000 MCG/ML injection Commonly known as: VITAMIN B12 1 ml injection-once a month.   DULoxetine 60 MG capsule Commonly known as: CYMBALTA Take 1 capsule (60 mg total) by mouth daily.   finasteride 5 MG tablet Commonly known as: PROSCAR TAKE 1 TABLET BY MOUTH DAILY   folic acid 1 MG tablet Commonly known as: FOLVITE Take 1 tablet (1 mg total) by mouth daily. Start taking on: May 11, 2023   furosemide 20 MG tablet Commonly known as: LASIX Take 1 tablet (20 mg total) by mouth daily as needed.   hydrocortisone valerate ointment 0.2 % Commonly known as: WEST-CORT APPLY TOPICALLY TO AFFECTED  AREA(S) TWICE DAILY   hydrOXYzine 25 MG capsule Commonly known as: VISTARIL Take 1 capsule (25 mg total) by mouth every 8 (eight) hours as needed for itching.   Lantus SoloStar 100 UNIT/ML Solostar Pen Generic drug: insulin glargine Inject 15 Units into the skin daily.    losartan 25 MG tablet Commonly known as: COZAAR TAKE 1 TABLET(25 MG) BY MOUTH DAILY   metFORMIN 1000 MG tablet Commonly known as: GLUCOPHAGE TAKE 1 TABLET BY MOUTH TWICE  DAILY WITH MEALS   multivitamin with minerals Tabs tablet Take 1 tablet by mouth daily. Start taking on: May 11, 2023   nutrition supplement (JUVEN) Pack Take 1 packet by mouth 2 (two) times daily between meals.   feeding supplement Liqd Take 237 mLs by mouth at bedtime.   omeprazole 20 MG capsule Commonly known as: PRILOSEC TAKE 1 CAPSULE BY MOUTH TWICE  DAILY BEFORE MEALS What changed: See the new instructions.   oxyCODONE-acetaminophen 5-325 MG tablet Commonly known as: PERCOCET/ROXICET Take 1-2 tablets by mouth every 8 (eight) hours as needed for severe pain (pain score 7-10).   simvastatin 40 MG tablet Commonly known as: ZOCOR TAKE 1 TABLET BY MOUTH AT  BEDTIME   Taltz 80 MG/ML pen Generic drug: ixekizumab Inject 80 mg into the skin every 28 (twenty-eight) days. For maintenance.  tamsulosin 0.4 MG Caps capsule Commonly known as: FLOMAX TAKE 1 CAPSULE BY MOUTH DAILY   thiamine 100 MG tablet Commonly known as: Vitamin B-1 Take 1 tablet (100 mg total) by mouth daily. Start taking on: May 11, 2023   triamcinolone lotion 0.1 % Commonly known as: KENALOG APPLY TO AFFECTED AREA(S)  TOPICALLY 3 TIMES DAILY   zinc sulfate 220 (50 Zn) MG capsule Take 1 capsule (220 mg total) by mouth daily. Start taking on: May 11, 2023               Discharge Care Instructions  (From admission, onward)           Start     Ordered   05/10/23 0000  Leave dressing on - Keep it clean, dry, and intact until clinic visit        05/10/23 1058            Follow-up Information     Dew, Marlow Baars, MD Follow up in 1 month(s).   Specialties: Vascular Surgery, Radiology, Interventional Cardiology Why: Follow up to evaluate for right lower extremity angiogram. Patient will need bilateral  lower extremity ultrasounds with ABI's. Contact information: [address] [place] [address] [phone]                Discharge Exam: Ceasar Mons Weights   05/05/23 1200 05/08/23 1152  Weight: 97.5 kg 98.9 kg   General - Elderly Caucasian male, no apparent distress HEENT - PERRLA, EOMI, atraumatic head, non tender sinuses. Lung - Clear, basal rales, no rhonchi, wheezes. Heart - S1, S2 heard, no murmurs, rubs, trace pedal edema. Abdomen - Soft, non tender nondistended, bowel sounds good Neuro - Alert, awake and oriented x 3, non focal exam. Skin - Warm and dry.  Left foot dressing noted  Condition at discharge: stable  The results of significant diagnostics from this hospitalization (including imaging, microbiology, ancillary and laboratory) are listed below for reference.   Imaging Studies: PERIPHERAL VASCULAR CATHETERIZATION  Result Date: 05/07/2023 See surgical note for result.  US ARTERIAL ABI (SCREENING LOWER EXTREMITY)  Result Date: 05/07/2023 CLINICAL DATA:  Peripheral arterial disease Osteomyelitis EXAM: NONINVASIVE PHYSIOLOGIC VASCULAR STUDY OF BILATERAL LOWER EXTREMITIES TECHNIQUE: Evaluation of both lower extremities were performed at rest, including calculation of ankle-brachial indices with single level pressure measurements and doppler recording. COMPARISON:  None available. FINDINGS: Right ABI:  1.18 Left ABI:  1.21 Right Lower Extremity:  Normal arterial waveforms at the ankle. Left Lower Extremity:  Normal arterial waveforms at the ankle. 1.0-1.4 Normal IMPRESSION: Normal ankle-brachial indices Electronically Signed   By: Mauri Reading  Mir M.D.   On: 05/07/2023 11:32   MR FOOT LEFT WO CONTRAST  Result Date: 05/06/2023 CLINICAL DATA:  Left fifth toe infection. EXAM: MRI OF THE LEFT FOOT WITHOUT CONTRAST TECHNIQUE: Multiplanar, multisequence MR imaging of the left foot was performed. No intravenous contrast was administered. COMPARISON:  Left  foot x-rays dated June 04, 2013. FINDINGS: Despite efforts by the technologist and patient, motion artifact is present on today's exam and could not be eliminated. This reduces exam sensitivity and specificity. Bones/Joint/Cartilage Prominent marrow edema with corresponding decreased T1 marrow signal involving the majority the fifth metatarsal, sparing the base, in the entire fifth proximal phalanx. There is early bone destruction of the fifth metatarsal head and fifth proximal phalanx base. Large fifth MTP joint. Dorsal subluxation of the fifth proximal phalanx with respect to the metatarsal head. No acute fracture. Moderate midfoot and mild first  MTP joint osteoarthritis. Ligaments The fifth MTP joint collateral ligaments are torn. Remaining toe collateral ligaments are intact. Muscles and Tendons No tenosynovitis. Near complete atrophy of the intrinsic foot muscles. Soft tissue Small ulcer at the plantar lateral aspect of the fifth metatarsal head with sinus tract extending to bone (series 4, image 30). No soft tissue mass. IMPRESSION: 1. Small ulcer at the plantar lateral aspect of the fifth metatarsal head with sinus tract extending to bone. 2. Septic arthritis of the fifth MTP joint with osteomyelitis of the majority of the fifth metatarsal and entire fifth proximal phalanx. Electronically Signed   By: Obie Dredge M.D.   On: 05/06/2023 09:30   IR BONE MARROW BIOPSY & ASPIRATION  Result Date: 04/27/2023 INDICATION: Worsenining anemia EXAM: FLUOROSCOPIC GUIDED BONE MARROW BIOPSY AND ASPIRATION MEDICATIONS: None FLUOROSCOPY TIME:  Fluoroscopic dose; 7 mGy ANESTHESIA/SEDATION: Moderate (conscious) sedation was employed during this procedure. A total of Versed 1 mg and Fentanyl 100 mcg was administered intravenously. Moderate Sedation Time: 12 minutes. The patient's level of consciousness and vital signs were monitored continuously by radiology nursing throughout the procedure under my direct  supervision. COMPLICATIONS: None immediate. PROCEDURE: Informed consent was obtained from the patient and/or patient's representative following an explanation of the procedure, risks, benefits and alternatives. The patient understands, agrees and consents for the procedure. All questions were addressed. A time out was performed prior to the initiation of the procedure. The patient was positioned prone on the fluoroscopy table and the posterior aspect of the RIGHT iliac crest was marked fluoroscopically. The operative site was prepped and draped in the usual sterile fashion. Under sterile conditions and local anesthesia, an 11 gauge coaxial bone biopsy needle was advanced into the posterior aspect of the RIGHT iliac marrow space under intermittent fluoroscopic guidance. Multiple fluoroscopic images were saved procedural documentation purposes. Initially, a bone marrow aspiration was performed. Next, a bone marrow biopsy was obtained with the 11 gauge outer bone marrow device. The 11 gauge coaxial bone biopsy needle was re-advanced into a slightly different location within the left iliac marrow space, positioning was confirmed with fluoroscopic imaging and an additional bone marrow biopsy was obtained. The needle was removed and superficial hemostasis was obtained with manual compression. A dressing was applied. The patient tolerated the procedure well without immediate post procedural complication. IMPRESSION: Successful fluoroscopic guided RIGHT iliac bone marrow aspiration and core biopsy. Roanna Banning, MD [facility] Electronically Signed   By: Roanna Banning M.D.   On: 04/27/2023 09:27   US Abdomen Complete  Result Date: 04/23/2023 CLINICAL DATA:  Cirrhosis. EXAM: ABDOMEN ULTRASOUND COMPLETE COMPARISON:  None Available. FINDINGS: Gallbladder: No gallstones or wall thickening visualized. No sonographic Murphy sign noted by sonographer. Common bile duct:  Diameter: 2.8 mm Liver: Increased echogenicity. No focal lesion. Portal vein is patent on color Doppler imaging with normal direction of blood flow towards the liver. IVC: No abnormality visualized. Pancreas: Visualized portion unremarkable. Spleen: Size and appearance within normal limits. Right Kidney: Length: 12.4 cm. Echogenicity within normal limits. No mass or hydronephrosis visualized. Left Kidney: Length: 12.0 cm. Echogenicity within normal limits. No mass or hydronephrosis visualized. Abdominal aorta: No aneurysm visualized. Other findings: None. IMPRESSION: 1. Increased hepatic parenchymal echogenicity suggestive of steatosis. 2. No cholelithiasis or sonographic evidence for acute cholecystitis. Electronically Signed   By: Annia Belt M.D.   On: 04/23/2023 09:38    Microbiology: Results for orders placed or performed during the hospital encounter of 05/05/23  Culture, blood (Routine X 2) w Reflex to ID Panel     Status: None   Collection Time: 05/05/23  4:38 PM   Specimen: BLOOD LEFT ARM  Result Value Ref Range Status   Specimen Description BLOOD LEFT ARM  Final   Special Requests   Final    BOTTLES DRAWN AEROBIC AND ANAEROBIC Blood Culture results may not be optimal due to an excessive volume of blood received in culture bottles   Culture   Final    NO GROWTH 5 DAYS Performed at [facility], [address] [place], [place] [address]    Report Status 05/10/2023 FINAL  Final  Culture, blood (Routine X 2) w Reflex to ID Panel     Status: None   Collection Time: 05/05/23  4:38 PM   Specimen: BLOOD RIGHT ARM  Result Value Ref Range Status   Specimen Description BLOOD RIGHT ARM  Final   Special Requests   Final    BOTTLES DRAWN AEROBIC AND ANAEROBIC Blood Culture results may not be optimal due to an excessive volume of blood received in culture bottles   Culture   Final    NO GROWTH 5 DAYS Performed at [facility], [address], [place], [place] [address]     Report Status 05/10/2023 FINAL  Final  Aerobic/Anaerobic Culture w Gram Stain (surgical/deep wound)     Status: None (Preliminary result)   Collection Time: 05/08/23  1:08 PM   Specimen: Wound; Tissue  Result Value Ref Range Status   Specimen Description   Final    WOUND LEFT 5TH METATARSAL Performed at [facility], [address] [place], [place] [address]    Special Requests   Final    NONE Performed at [facility], [address] [place], [place] [address]    Gram Stain   Final    RARE WBC PRESENT, PREDOMINANTLY MONONUCLEAR NO ORGANISMS SEEN    Culture   Final    NO GROWTH 2 DAYS Performed at [facility], [address], [place], [place] [address]    Report Status PENDING  Incomplete    Labs: CBC: Recent Labs  Lab 05/05/23 1201 05/09/23 0445 05/10/23 0606  WBC 5.2 4.9 4.2  NEUTROABS 3.5  --   --   HGB 10.8* 8.9* 8.9*  HCT 31.8* 26.4* 27.0*  MCV 97.5 97.4 98.2  PLT 352 275 259   Basic Metabolic Panel: Recent Labs  Lab 05/05/23 1201 05/07/23 0442 05/09/23 0445 05/10/23 0606  NA 127* 131* 127* 130*  K 3.3* 3.4* 3.4* 3.6  CL 83* 88* 94* 97*  CO2 29 30 25 24   GLUCOSE 256* 178* 209* 189*  BUN 10 8 13 16   CREATININE 0.92 0.90 0.90 0.90  CALCIUM 9.0 8.9 8.2* 8.5*  MG 1.5* 1.4* 1.3*  --   PHOS  --  3.7  --   --    Liver Function Tests: Recent Labs  Lab 05/05/23 1201  AST 20  ALT 11  ALKPHOS 86  BILITOT 1.1  PROT 7.3  ALBUMIN 3.8   CBG: Recent Labs  Lab 05/09/23 0736 05/09/23 1149 05/09/23 1717 05/09/23 2057 05/10/23 0737  GLUCAP 207* 322* 203* 219* 184*    Discharge time spent: 37 minutes.  Signed: Marcelino Duster, MD [facility] 05/10/2023

## 2023-05-11 ENCOUNTER — Ambulatory Visit: Payer: 59 | Admitting: Internal Medicine

## 2023-05-11 LAB — SURGICAL PATHOLOGY

## 2023-05-12 NOTE — Plan of Care (Signed)
[facility] Tonsillectomy/Adenoidectomy, Postoperative PEDS care plan entered in error.

## 2023-05-13 LAB — AEROBIC/ANAEROBIC CULTURE W GRAM STAIN (SURGICAL/DEEP WOUND): Culture: NO GROWTH

## 2023-05-20 ENCOUNTER — Other Ambulatory Visit: Payer: Self-pay | Admitting: Internal Medicine

## 2023-05-20 NOTE — Progress Notes (Signed)
Anemia of chronic disease 

## 2023-05-21 ENCOUNTER — Other Ambulatory Visit: Payer: Self-pay | Admitting: Internal Medicine

## 2023-05-21 MED ORDER — HYDROXYZINE PAMOATE 25 MG PO CAPS: 25.0000 mg | ORAL_CAPSULE | Freq: Three times a day (TID) | ORAL | 0 refills | Status: DC | PRN

## 2023-05-25 ENCOUNTER — Other Ambulatory Visit: Payer: Self-pay | Admitting: Internal Medicine

## 2023-06-06 ENCOUNTER — Ambulatory Visit: Payer: 59 | Admitting: Internal Medicine

## 2023-06-06 ENCOUNTER — Encounter: Payer: Self-pay | Admitting: Internal Medicine

## 2023-06-06 VITALS — BP 132/70 | HR 80 | Temp 98.2°F | Ht 71.0 in | Wt 223.0 lb

## 2023-06-06 DIAGNOSIS — Z794 Long term (current) use of insulin: Secondary | ICD-10-CM

## 2023-06-06 DIAGNOSIS — E1140 Type 2 diabetes mellitus with diabetic neuropathy, unspecified: Secondary | ICD-10-CM | POA: Diagnosis not present

## 2023-06-06 DIAGNOSIS — R209 Unspecified disturbances of skin sensation: Secondary | ICD-10-CM

## 2023-06-06 DIAGNOSIS — Z23 Encounter for immunization: Secondary | ICD-10-CM

## 2023-06-06 DIAGNOSIS — D649 Anemia, unspecified: Secondary | ICD-10-CM

## 2023-06-06 DIAGNOSIS — Z7984 Long term (current) use of oral hypoglycemic drugs: Secondary | ICD-10-CM

## 2023-06-06 DIAGNOSIS — E119 Type 2 diabetes mellitus without complications: Secondary | ICD-10-CM | POA: Diagnosis not present

## 2023-06-06 NOTE — Assessment & Plan Note (Signed)
I think his anemia is affecting the neuropathy pain Will recheck levels I will send note to Dr Racheal Patches to consider action depending on results today

## 2023-06-06 NOTE — Addendum Note (Signed)
Addended by: Tillman Abide I on: 06/06/2023 08:16 AM   Modules accepted: Orders

## 2023-06-06 NOTE — Assessment & Plan Note (Signed)
I showed him pictures of true Raynaud's and his hands don't look like that His prominent numbness suggests this is neuropathy also Discussed that the anemia will worsen this

## 2023-06-06 NOTE — Addendum Note (Signed)
Addended by: Eual Fines on: 06/06/2023 08:32 AM   Modules accepted: Orders

## 2023-06-06 NOTE — Progress Notes (Signed)
Subjective:    Patient ID: [name], male    DOB: 08-01-1955, 67 y.o.   MRN: [id number]  HPI Here with wife due to cold hands  Just had amputation of left 5th toe less than a month ago Had proximal incision to be sure bone healing Still in boot Had vascular procedure on left prior to amputation   Hands are "numb and frozen all the time" They turn Studer--the fingers Goes back 6 months or more Cold feeling in foot is better since vascular procedure  No rash other than psoriasis No photosensitivity Notes arthritis in bilateral CMC  Sugars are "terrible"--- can be 150-180's in AM Trouble getting under control since foot infection  Current Outpatient Medications on File Prior to Visit  Medication Sig Dispense Refill   Alpha-Lipoic Acid 600 MG TABS Take by mouth. 2 in am and 1 in pm     amLODipine (NORVASC) 5 MG tablet TAKE 1 TABLET(5 MG) BY MOUTH DAILY 90 tablet 2   ascorbic acid (VITAMIN C) 500 MG tablet Take 1 tablet (500 mg total) by mouth 2 (two) times daily. 60 tablet 0   B-D UF III MINI PEN NEEDLES 31G X 5 MM MISC USE AS DIRECTED DAILY 90 each 3   calcipotriene (DOVONOX) 0.005 % ointment Apply topically 2 (two) times daily.     CALCIUM-MAGNESIUM-ZINC PO Take by mouth.     Cinnamon 500 MG capsule Take 500 mg by mouth 2 (two) times daily.     clobetasol ointment (TEMOVATE) 0.05 % Apply topically.     CONTOUR NEXT TEST test strip USE 1 TO 2 TIMES DAILY TO  CHECK BLOOD SUGAR 200 strip 3   cyanocobalamin (VITAMIN B12) 1000 MCG/ML injection 1 ml injection-once a month. 12 mL 1   DULoxetine (CYMBALTA) 60 MG capsule Take 1 capsule (60 mg total) by mouth daily. 90 capsule 3   feeding supplement (ENSURE ENLIVE / ENSURE PLUS) LIQD Take 237 mLs by mouth at bedtime. 237 mL 2   finasteride (PROSCAR) 5 MG tablet TAKE 1 TABLET BY MOUTH DAILY 90 tablet 3   folic acid (FOLVITE) 1 MG tablet Take 1 tablet (1 mg total) by mouth daily. 30 tablet 0   furosemide (LASIX) 20 MG tablet Take 1  tablet (20 mg total) by mouth daily as needed. 90 tablet 3   hydrocortisone valerate ointment (WEST-CORT) 0.2 % APPLY TOPICALLY TO AFFECTED  AREA(S) TWICE DAILY 120 g 1   hydrOXYzine (VISTARIL) 25 MG capsule Take 1 capsule (25 mg total) by mouth every 8 (eight) hours as needed for itching. 60 capsule 0   Ixekizumab (TALTZ) 80 MG/ML SOAJ Inject 80 mg into the skin every 28 (twenty-eight) days. For maintenance. 1 mL 4   LANTUS SOLOSTAR 100 UNIT/ML Solostar Pen INJECT SUBCUTANEOUSLY 22 UNITS  DAILY 30 mL 3   losartan (COZAAR) 25 MG tablet TAKE 1 TABLET(25 MG) BY MOUTH DAILY 90 tablet 3   metFORMIN (GLUCOPHAGE) 1000 MG tablet TAKE 1 TABLET BY MOUTH TWICE  DAILY WITH MEALS 180 tablet 3   Multiple Vitamin (MULTIVITAMIN WITH MINERALS) TABS tablet Take 1 tablet by mouth daily. 30 tablet 2   nutrition supplement, JUVEN, (JUVEN) PACK Take 1 packet by mouth 2 (two) times daily between meals. 60 each 0   omeprazole (PRILOSEC) 20 MG capsule TAKE 1 CAPSULE BY MOUTH TWICE  DAILY BEFORE MEALS (Patient taking differently: Take 20 mg by mouth daily.) 180 capsule 3   oxyCODONE-acetaminophen (PERCOCET/ROXICET) 5-325 MG tablet Take 1-2 tablets  by mouth every 8 (eight) hours as needed for severe pain (pain score 7-10). 10 tablet 0   simvastatin (ZOCOR) 40 MG tablet TAKE 1 TABLET BY MOUTH AT  BEDTIME 90 tablet 3   SYRINGE-NEEDLE, DISP, 3 ML (BD SAFETYGLIDE SYRINGE/NEEDLE) 25G X 1" 3 ML MISC Use the needle for IM injection once a  month; or as directed. 30 each 1   tamsulosin (FLOMAX) 0.4 MG CAPS capsule TAKE 1 CAPSULE BY MOUTH DAILY 90 capsule 3   thiamine (VITAMIN B-1) 100 MG tablet Take 1 tablet (100 mg total) by mouth daily. 30 tablet 0   triamcinolone lotion (KENALOG) 0.1 % APPLY TO AFFECTED AREA(S)  TOPICALLY 3 TIMES DAILY 180 mL 1   zinc sulfate 220 (50 Zn) MG capsule Take 1 capsule (220 mg total) by mouth daily. 30 capsule 0   No current facility-administered medications on file prior to visit.    Allergies   Allergen Reactions   Glipizide Other (See Comments)    REACTION: wt. gain, hands tingling   Lisinopril Other (See Comments)    HYPERKALEMIA   Ramipril Cough   Ibuprofen     UNSPECIFIED REACTION    Bactrim [Sulfamethoxazole-Trimethoprim] Nausea And Vomiting and Rash    Past Medical History:  Diagnosis Date   Adenomatous polyp    Allergy    Anemia    Arthritis    feet    Bunion 04/02/2013   STATUS POST OP BUN REPAIR   Diabetes mellitus    Foot ulcer (HCC)    GERD (gastroesophageal reflux disease)    Gout    Hammertoe    Hyperlipidemia    Hypertension    Neuromuscular disorder (HCC)    neuropathy   Plantar flexed metatarsal    Psoriasis    Schatzki's ring     Past Surgical History:  Procedure Laterality Date   AMPUTATION TOE Left 05/08/2023   Procedure: AMPUTATION 5TH TOE WITH PARTIAL RAY RESECTION;  Surgeon: Linus Galas, DPM;  Location: [facility];  Service: Orthopedics/Podiatry;  Laterality: Left;   CARPAL TUNNEL RELEASE Left 08/24/2022   Procedure: CARPAL TUNNEL RELEASE;  Surgeon: Deeann Saint, MD;  Location: [facility];  Service: Orthopedics;  Laterality: Left;   CATARACT EXTRACTION W/PHACO Left 03/21/2023   Procedure: CATARACT EXTRACTION PHACO AND INTRAOCULAR LENS PLACEMENT (IOC) LEFT MALYUGIN DIABETIC 9.88 1:01.4;  Surgeon: Lockie Mola, MD;  Location: [facility];  Service: Ophthalmology;  Laterality: Left;   CATARACT EXTRACTION W/PHACO Right 04/04/2023   Procedure: CATARACT EXTRACTION PHACO AND INTRAOCULAR LENS PLACEMENT (IOC) RIGHT MALYUGIN DIABETIC 15.31 01:15.1;  Surgeon: Lockie Mola, MD;  Location: [facility];  Service: Ophthalmology;  Laterality: Right;   COLONOSCOPY     FOOT AMPUTATION Right 01/11/2017   Dr Deborah Chalk ([facility])--transmetatarsal   FOOT SURGERY Right 02/28/2013   Arta Bruce HAM TOE2,3 PINS ,2ND MET OSTEOTOMY, 5TH MET W/SCREW   [phone], 04-2013   HIP FRACTURE SURGERY  1993   surgery x2   IR BONE MARROW BIOPSY & ASPIRATION   04/27/2023   LOWER EXTREMITY ANGIOGRAPHY Left 05/07/2023   Procedure: Lower Extremity Angiography;  Surgeon: Annice Needy, MD;  Location: [facility];  Service: Cardiovascular;  Laterality: Left;   NASAL SEPTOPLASTY W/ TURBINOPLASTY Bilateral 02/08/2023   Procedure: NASAL SEPTOPLASTY WITH TURBINATE REDUCTION;  Surgeon: Vernie Murders, MD;  Location: [facility];  Service: ENT;  Laterality: Bilateral;   POLYPECTOMY     PROSTATE BIOPSY  10/10/2019   TOTAL HIP ARTHROPLASTY     Left hip  replacement/femur fx 07/04, Screw removal left hip- [facility] 11/01    Family History  Problem Relation Age of Onset   Diabetes Mother    Diabetes Brother    Alzheimer's disease Maternal Aunt    Alzheimer's disease Cousin    Cancer Neg Hx    Colon cancer Neg Hx    Colon polyps Neg Hx    Rectal cancer Neg Hx    Stomach cancer Neg Hx    Esophageal cancer Neg Hx     Social History   Socioeconomic History   Marital status: Married    Spouse name: Not on file   Number of children: 3   Years of education: Not on file   Highest education level: Not on file  Occupational History   Occupation: PC ADMINISTRATOR    Employer: [employer]  Tobacco Use   Smoking status: Never    Passive exposure: Past   Smokeless tobacco: Never  Vaping Use   Vaping status: Never Used  Substance and Sexual Activity   Alcohol use: Yes    Alcohol/week: 1.0 standard drink of alcohol    Types: 1 Standard drinks or equivalent per week    Comment: occasional (DWI 1991)   Drug use: No   Sexual activity: Yes    Birth control/protection: None  Other Topics Concern   Not on file  Social History Narrative    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 With wife; and 2 dog.     Social Determinants of Health   Financial Resource Strain: Not on file  Food Insecurity: No Food Insecurity (05/06/2023)   Hunger Vital Sign    Worried About Running Out of Food in the Last Year:  Never true    Ran Out of Food in the Last Year: Never true  Transportation Needs: No Transportation Needs (05/06/2023)   PRAPARE - Administrator, Civil Service (Medical): No    Lack of Transportation (Non-Medical): No  Physical Activity: Not on file  Stress: Not on file  Social Connections: Not on file  Intimate Partner Violence: Not At Risk (05/06/2023)   Humiliation, Afraid, Rape, and Kick questionnaire    Fear of Current or Ex-Partner: No    Emotionally Abused: No    Physically Abused: No    Sexually Abused: No   Review of Systems No fever    Objective:   Physical Exam Constitutional:      Appearance: Normal appearance.  Cardiovascular:     Rate and Rhythm: Normal rate and regular rhythm.     Heart sounds: No murmur heard.    No gallop.  Pulmonary:     Effort: Pulmonary effort is normal.     Breath sounds: Normal breath sounds. No wheezing or rales.  Musculoskeletal:     Cervical back: Neck supple.  Lymphadenopathy:     Cervical: No cervical adenopathy.  Skin:    Comments: 5th left toe amputated 1.5 cm ulcer on base of left foot--no discharge and not inflamed  Neurological:     Mental Status: He is alert.  Psychiatric:        Mood and Affect: Mood normal.            Assessment & Plan:

## 2023-06-06 NOTE — Assessment & Plan Note (Addendum)
He has worsened control associated with the osteomyelitis Continues on lantus 12-16 daily Metformin 1000 bid   Duloxetine and alpha lipoic acid not controlling the neuropathy Discussed trying TENS and lidocaine

## 2023-06-07 LAB — RENAL FUNCTION PANEL
Albumin: 4.2 g/dL (ref 3.9–4.9)
BUN/Creatinine Ratio: 20 (ref 10–24)
BUN: 19 mg/dL (ref 8–27)
CO2: 23 mmol/L (ref 20–29)
Calcium: 10.7 mg/dL — ABNORMAL HIGH (ref 8.6–10.2)
Chloride: 100 mmol/L (ref 96–106)
Creatinine, Ser: 0.96 mg/dL (ref 0.76–1.27)
Glucose: 144 mg/dL — ABNORMAL HIGH (ref 70–99)
Phosphorus: 4.8 mg/dL — ABNORMAL HIGH (ref 2.8–4.1)
Potassium: 5.2 mmol/L (ref 3.5–5.2)
Sodium: 139 mmol/L (ref 134–144)
eGFR: 87 mL/min/{1.73_m2} (ref >59)

## 2023-06-07 LAB — TSH: TSH: 4.89 u[IU]/mL — ABNORMAL HIGH (ref 0.450–4.500)

## 2023-06-07 LAB — CBC WITH DIFFERENTIAL/PLATELET
Basophils Absolute: 0 10*3/uL (ref 0.0–0.2)
Basos: 1 %
EOS (ABSOLUTE): 0.4 10*3/uL (ref 0.0–0.4)
Eos: 6 %
Hematocrit: 33.8 % — ABNORMAL LOW (ref 37.5–51.0)
Hemoglobin: 10.8 g/dL — ABNORMAL LOW (ref 13.0–17.7)
Immature Grans (Abs): 0 10*3/uL (ref 0.0–0.1)
Immature Granulocytes: 0 %
Lymphocytes Absolute: 1.3 10*3/uL (ref 0.7–3.1)
Lymphs: 20 %
MCH: 31.3 pg (ref 26.6–33.0)
MCHC: 32 g/dL (ref 31.5–35.7)
MCV: 98 fL — ABNORMAL HIGH (ref 79–97)
Monocytes Absolute: 0.5 10*3/uL (ref 0.1–0.9)
Monocytes: 7 %
Neutrophils Absolute: 4.1 10*3/uL (ref 1.4–7.0)
Neutrophils: 66 %
Platelets: 327 10*3/uL (ref 150–450)
RBC: 3.45 x10E6/uL — ABNORMAL LOW (ref 4.14–5.80)
RDW: 12.4 % (ref 11.6–15.4)
WBC: 6.3 10*3/uL (ref 3.4–10.8)

## 2023-06-07 LAB — HEPATIC FUNCTION PANEL
ALT: 18 [IU]/L (ref 0–44)
AST: 20 [IU]/L (ref 0–40)
Alkaline Phosphatase: 91 [IU]/L (ref 44–121)
Bilirubin Total: 0.3 mg/dL (ref 0.0–1.2)
Bilirubin, Direct: 0.17 mg/dL (ref 0.00–0.40)
Total Protein: 6.3 g/dL (ref 6.0–8.5)

## 2023-06-07 LAB — HEMOGLOBIN A1C
Est. average glucose Bld gHb Est-mCnc: 154 mg/dL
Hgb A1c MFr Bld: 7 % — ABNORMAL HIGH (ref 4.8–5.6)

## 2023-06-17 ENCOUNTER — Encounter: Payer: Self-pay | Admitting: Internal Medicine

## 2023-06-18 MED ORDER — GABAPENTIN 300 MG PO CAPS: 300.0000 mg | ORAL_CAPSULE | Freq: Three times a day (TID) | ORAL | 3 refills | Status: DC

## 2023-06-19 ENCOUNTER — Ambulatory Visit: Payer: 59 | Admitting: Internal Medicine

## 2023-06-20 ENCOUNTER — Other Ambulatory Visit: Payer: 59

## 2023-06-20 DIAGNOSIS — N401 Enlarged prostate with lower urinary tract symptoms: Secondary | ICD-10-CM

## 2023-06-21 LAB — PSA: Prostate Specific Ag, Serum: 0.6 ng/mL (ref 0.0–4.0)

## 2023-06-25 ENCOUNTER — Ambulatory Visit: Payer: 59 | Admitting: Internal Medicine

## 2023-06-26 ENCOUNTER — Ambulatory Visit: Payer: 59 | Admitting: Urology

## 2023-06-26 VITALS — BP 167/82 | HR 93 | Ht 72.0 in | Wt 223.1 lb

## 2023-06-26 DIAGNOSIS — R972 Elevated prostate specific antigen [PSA]: Secondary | ICD-10-CM

## 2023-06-26 DIAGNOSIS — N401 Enlarged prostate with lower urinary tract symptoms: Secondary | ICD-10-CM

## 2023-06-26 DIAGNOSIS — R339 Retention of urine, unspecified: Secondary | ICD-10-CM | POA: Diagnosis not present

## 2023-06-26 DIAGNOSIS — Z87898 Personal history of other specified conditions: Secondary | ICD-10-CM

## 2023-06-26 LAB — BLADDER SCAN AMB NON-IMAGING: Scan Result: 132

## 2023-06-26 NOTE — Progress Notes (Signed)
I,Amy L Pierron,acting as a scribe for Vanna Scotland, MD.,have documented all relevant documentation on the behalf of Vanna Scotland, MD,as directed by  Vanna Scotland, MD while in the presence of Vanna Scotland, MD.  06/26/2023 10:06 AM   [name] 02-Jul-1956 [id number]  Referring provider: Karie Schwalbe, MD [address] [place],  [place] [address]  Chief Complaint  Patient presents with   Benign Prostatic Hypertrophy    HPI: 67 year-old male with a personal history of BPH and fluctuating PSA presents today for a six month follow-up.   He underwent prostate biopsy in 2021 that showed a 77 gram prostate and thereafter his PSA stabilized. He's currently managed on Flomax and Finasteride. The Finasteride was started a year ago when his urinary symptoms had increased. At his last six month visit his symptoms had improved.   His most recent PSA on 06/20/23 was 0.6 which is trending downwards from 1.5 on 12/20/2022.   He mentions his urinary symptoms feel 100% better and he mostly feels like he is emptying his bladder.    IPSS     Row Name 06/26/23 0900         International Prostate Symptom Score   How often have you had the sensation of not emptying your bladder? Not at All     How often have you had to urinate less than every two hours? Less than 1 in 5 times     How often have you found you stopped and started again several times when you urinated? Not at All     How often have you found it difficult to postpone urination? Not at All     How often have you had a weak urinary stream? Not at All     How often have you had to strain to start urination? Less than 1 in 5 times     How many times did you typically get up at night to urinate? 2 Times     Total IPSS Score 4       Quality of Life due to urinary symptoms   If you were to spend the rest of your life with your urinary condition just the way it is now how would you feel about that? Pleased             Score:  1-7 Mild 8-19 Moderate 20-35 Severe Results for orders placed or performed in visit on 06/26/23  Bladder Scan (Post Void Residual) in office  Result Value Ref Range   Scan Result 132 ml     PMH: Past Medical History:  Diagnosis Date   Adenomatous polyp    Allergy    Anemia    Arthritis    feet    Bunion 04/02/2013   STATUS POST OP BUN REPAIR   Diabetes mellitus    Foot ulcer (HCC)    GERD (gastroesophageal reflux disease)    Gout    Hammertoe    Hyperlipidemia    Hypertension    Neuromuscular disorder (HCC)    neuropathy   Plantar flexed metatarsal    Psoriasis    Schatzki's ring     Surgical History: Past Surgical History:  Procedure Laterality Date   AMPUTATION TOE Left 05/08/2023   Procedure: AMPUTATION 5TH TOE WITH PARTIAL RAY RESECTION;  Surgeon: Linus Galas, DPM;  Location: [facility];  Service: Orthopedics/Podiatry;  Laterality: Left;   CARPAL TUNNEL RELEASE Left 08/24/2022   Procedure: CARPAL TUNNEL RELEASE;  Surgeon: Hyacinth Meeker,  Dimas Aguas, MD;  Location: [facility];  Service: Orthopedics;  Laterality: Left;   CATARACT EXTRACTION W/PHACO Left 03/21/2023   Procedure: CATARACT EXTRACTION PHACO AND INTRAOCULAR LENS PLACEMENT (IOC) LEFT MALYUGIN DIABETIC 9.88 1:01.4;  Surgeon: Lockie Mola, MD;  Location: [facility];  Service: Ophthalmology;  Laterality: Left;   CATARACT EXTRACTION W/PHACO Right 04/04/2023   Procedure: CATARACT EXTRACTION PHACO AND INTRAOCULAR LENS PLACEMENT (IOC) RIGHT MALYUGIN DIABETIC 15.31 01:15.1;  Surgeon: Lockie Mola, MD;  Location: [facility];  Service: Ophthalmology;  Laterality: Right;   COLONOSCOPY     FOOT AMPUTATION Right 01/11/2017   Dr Deborah Chalk ([facility])--transmetatarsal   FOOT SURGERY Right 02/28/2013   Arta Bruce HAM TOE2,3 PINS ,2ND MET OSTEOTOMY, 5TH MET W/SCREW   [phone], 04-2013   HIP FRACTURE SURGERY  1993   surgery x2   IR BONE MARROW BIOPSY & ASPIRATION  04/27/2023   LOWER EXTREMITY ANGIOGRAPHY Left  05/07/2023   Procedure: Lower Extremity Angiography;  Surgeon: Annice Needy, MD;  Location: [facility];  Service: Cardiovascular;  Laterality: Left;   NASAL SEPTOPLASTY W/ TURBINOPLASTY Bilateral 02/08/2023   Procedure: NASAL SEPTOPLASTY WITH TURBINATE REDUCTION;  Surgeon: Vernie Murders, MD;  Location: [facility];  Service: ENT;  Laterality: Bilateral;   POLYPECTOMY     PROSTATE BIOPSY  10/10/2019   TOTAL HIP ARTHROPLASTY     Left hip replacement/femur fx 07/04, Screw removal left hip- [facility] 11/01    Home Medications:  Allergies as of 06/26/2023       Reactions   Glipizide Other (See Comments)   REACTION: wt. gain, hands tingling   Lisinopril Other (See Comments)   HYPERKALEMIA   Ramipril Cough   Ibuprofen    UNSPECIFIED REACTION    Bactrim [sulfamethoxazole-trimethoprim] Nausea And Vomiting, Rash        Medication List        Accurate as of June 26, 2023 10:06 AM. If you have any questions, ask your nurse or doctor.          STOP taking these medications    oxyCODONE-acetaminophen 5-325 MG tablet Commonly known as: PERCOCET/ROXICET       TAKE these medications    Alpha-Lipoic Acid 600 MG Tabs Take by mouth. 2 in am and 1 in pm   amLODipine 5 MG tablet Commonly known as: NORVASC TAKE 1 TABLET(5 MG) BY MOUTH DAILY   B-D UF III MINI PEN NEEDLES 31G X 5 MM Misc Generic drug: Insulin Pen Needle USE AS DIRECTED DAILY   BD SafetyGlide Syringe/Needle 25G X 1" 3 ML Misc Generic drug: SYRINGE-NEEDLE (DISP) 3 ML Use the needle for IM injection once a  month; or as directed.   calcipotriene 0.005 % ointment Commonly known as: DOVONOX Apply topically 2 (two) times daily.   CALCIUM-MAGNESIUM-ZINC PO Take by mouth.   Cinnamon 500 MG capsule Take 500 mg by mouth 2 (two) times daily.   clobetasol ointment 0.05 % Commonly known as: TEMOVATE Apply topically.   Contour Next Test test strip Generic drug: glucose blood USE 1 TO 2 TIMES DAILY  TO  CHECK BLOOD SUGAR   cyanocobalamin 1000 MCG/ML injection Commonly known as: VITAMIN B12 1 ml injection-once a month.   DULoxetine 60 MG capsule Commonly known as: CYMBALTA Take 1 capsule (60 mg total) by mouth daily.   feeding supplement Liqd Take 237 mLs by mouth at bedtime. What changed: Another medication with the same name was removed. Continue taking this medication, and follow the directions you see here.  finasteride 5 MG tablet Commonly known as: PROSCAR TAKE 1 TABLET BY MOUTH DAILY   furosemide 20 MG tablet Commonly known as: LASIX Take 1 tablet (20 mg total) by mouth daily as needed.   gabapentin 300 MG capsule Commonly known as: NEURONTIN Take 1 capsule (300 mg total) by mouth 3 (three) times daily.   hydrocortisone valerate ointment 0.2 % Commonly known as: WEST-CORT APPLY TOPICALLY TO AFFECTED  AREA(S) TWICE DAILY   hydrOXYzine 25 MG capsule Commonly known as: VISTARIL Take 1 capsule (25 mg total) by mouth every 8 (eight) hours as needed for itching.   Lantus SoloStar 100 UNIT/ML Solostar Pen Generic drug: insulin glargine INJECT SUBCUTANEOUSLY 22 UNITS  DAILY   losartan 25 MG tablet Commonly known as: COZAAR TAKE 1 TABLET(25 MG) BY MOUTH DAILY   metFORMIN 1000 MG tablet Commonly known as: GLUCOPHAGE TAKE 1 TABLET BY MOUTH TWICE  DAILY WITH MEALS   multivitamin with minerals Tabs tablet Take 1 tablet by mouth daily.   omeprazole 20 MG capsule Commonly known as: PRILOSEC TAKE 1 CAPSULE BY MOUTH TWICE  DAILY BEFORE MEALS What changed: See the new instructions.   simvastatin 40 MG tablet Commonly known as: ZOCOR TAKE 1 TABLET BY MOUTH AT  BEDTIME   Taltz 80 MG/ML pen Generic drug: ixekizumab Inject 80 mg into the skin every 28 (twenty-eight) days. For maintenance.   tamsulosin 0.4 MG Caps capsule Commonly known as: FLOMAX TAKE 1 CAPSULE BY MOUTH DAILY   triamcinolone lotion 0.1 % Commonly known as: KENALOG APPLY TO AFFECTED AREA(S)   TOPICALLY 3 TIMES DAILY        Allergies:  Allergies  Allergen Reactions   Glipizide Other (See Comments)    REACTION: wt. gain, hands tingling   Lisinopril Other (See Comments)    HYPERKALEMIA   Ramipril Cough   Ibuprofen     UNSPECIFIED REACTION    Bactrim [Sulfamethoxazole-Trimethoprim] Nausea And Vomiting and Rash    Family History: Family History  Problem Relation Age of Onset   Diabetes Mother    Diabetes Brother    Alzheimer's disease Maternal Aunt    Alzheimer's disease Cousin    Cancer Neg Hx    Colon cancer Neg Hx    Colon polyps Neg Hx    Rectal cancer Neg Hx    Stomach cancer Neg Hx    Esophageal cancer Neg Hx     Social History:  reports that he has never smoked. He has been exposed to tobacco smoke. He has never used smokeless tobacco. He reports current alcohol use of about 1.0 standard drink of alcohol per week. He reports that he does not use drugs.   Physical Exam: BP (!) 167/82   Pulse 93   Ht 6' (1.829 m)   Wt 223 lb 2 oz (101.2 kg)   BMI 30.26 kg/m   Constitutional:  Alert and oriented, No acute distress. HEENT: [place] AT, moist mucus membranes.  Trachea midline, no masses. GU: Normal sphincter tone.  40 gram prostate, no nodules. Neurologic: Grossly intact, no focal deficits, moving all 4 extremities. Psychiatric: Normal mood and affect.   Assessment & Plan:    1. BPH with incomplete bladder emptying  - Urinary symptoms are extremely well managed on finasteride and Flomax. He does have a slightly elevated PVR today but is otherwise asymptomatic. He'd like to continue this current regimen and is not interested in surgical intervention.   2. History of elevated PSA  - His PSA has trended down nicely.  PSA screening updated today. Rectal exam was unremarkable. Continue annual screening to age 73-75.  Return in about 1 year (around 06/25/2024) for IPSS, PSA, PVR, DRE.  I have reviewed the above documentation for accuracy and completeness, and  I agree with the above.   Vanna Scotland, MD   [facility] [address], [address] [place], [place] [address] [phone]

## 2023-07-09 ENCOUNTER — Other Ambulatory Visit (INDEPENDENT_AMBULATORY_CARE_PROVIDER_SITE_OTHER): Payer: Self-pay | Admitting: Vascular Surgery

## 2023-07-09 DIAGNOSIS — Z9889 Other specified postprocedural states: Secondary | ICD-10-CM

## 2023-07-09 DIAGNOSIS — I739 Peripheral vascular disease, unspecified: Secondary | ICD-10-CM

## 2023-07-10 ENCOUNTER — Ambulatory Visit (INDEPENDENT_AMBULATORY_CARE_PROVIDER_SITE_OTHER): Payer: 59 | Admitting: Vascular Surgery

## 2023-07-10 ENCOUNTER — Ambulatory Visit (INDEPENDENT_AMBULATORY_CARE_PROVIDER_SITE_OTHER): Payer: 59

## 2023-07-10 ENCOUNTER — Encounter (INDEPENDENT_AMBULATORY_CARE_PROVIDER_SITE_OTHER): Payer: Self-pay | Admitting: Vascular Surgery

## 2023-07-10 VITALS — BP 138/73 | HR 89 | Resp 18 | Ht 72.0 in | Wt 225.6 lb

## 2023-07-10 DIAGNOSIS — I7025 Atherosclerosis of native arteries of other extremities with ulceration: Secondary | ICD-10-CM | POA: Diagnosis not present

## 2023-07-10 DIAGNOSIS — E1140 Type 2 diabetes mellitus with diabetic neuropathy, unspecified: Secondary | ICD-10-CM | POA: Diagnosis not present

## 2023-07-10 DIAGNOSIS — I10 Essential (primary) hypertension: Secondary | ICD-10-CM | POA: Diagnosis not present

## 2023-07-10 DIAGNOSIS — Z794 Long term (current) use of insulin: Secondary | ICD-10-CM

## 2023-07-10 DIAGNOSIS — E782 Mixed hyperlipidemia: Secondary | ICD-10-CM

## 2023-07-10 DIAGNOSIS — I739 Peripheral vascular disease, unspecified: Secondary | ICD-10-CM | POA: Diagnosis not present

## 2023-07-10 DIAGNOSIS — Z9889 Other specified postprocedural states: Secondary | ICD-10-CM

## 2023-07-10 NOTE — Progress Notes (Signed)
MRN : [id number]  [name] is a 67 y.o. (28-Oct-1955) male who presents with chief complaint of  Chief Complaint  Patient presents with   Follow-up    Follow up with Annice Needy, MD (Vascular Surgery) in 1 month (06/09/2023); Follow up to evaluate for right lower extremity angiogram. Patient will need bilateral lower extremity ultrasounds with ABI's.  .  History of Present Illness: Patient returns today in follow up of his peripheral arterial disease.  He underwent angioplasty of the left anterior tibial artery about 2 months ago for nonhealing ulceration.  His ulcer has healed on the left side.  He has a small ulcer on the right side which has not entirely healed but seems to be improving.  He had no periprocedural complications but had appropriate and expected postoperative bruising and soreness.  He has mild lower extremity swelling but this seems to be better as well.  His ABIs today are 1.06 on the right and 1.08 on the left with triphasic waveforms.  Current Outpatient Medications  Medication Sig Dispense Refill   Alpha-Lipoic Acid 600 MG TABS Take by mouth. 2 in am and 1 in pm     amLODipine (NORVASC) 5 MG tablet TAKE 1 TABLET(5 MG) BY MOUTH DAILY 90 tablet 2   B-D UF III MINI PEN NEEDLES 31G X 5 MM MISC USE AS DIRECTED DAILY 90 each 3   calcipotriene (DOVONOX) 0.005 % ointment Apply topically 2 (two) times daily.     CALCIUM-MAGNESIUM-ZINC PO Take by mouth.     Cinnamon 500 MG capsule Take 500 mg by mouth 2 (two) times daily.     clobetasol ointment (TEMOVATE) 0.05 % Apply topically.     CONTOUR NEXT TEST test strip USE 1 TO 2 TIMES DAILY TO  CHECK BLOOD SUGAR 200 strip 3   cyanocobalamin (VITAMIN B12) 1000 MCG/ML injection 1 ml injection-once a month. 12 mL 1   DULoxetine (CYMBALTA) 60 MG capsule Take 1 capsule (60 mg total) by mouth daily. 90 capsule 3   feeding supplement (ENSURE ENLIVE / ENSURE PLUS) LIQD Take 237 mLs by mouth at bedtime. 237 mL 2   finasteride (PROSCAR)  5 MG tablet TAKE 1 TABLET BY MOUTH DAILY 90 tablet 3   furosemide (LASIX) 20 MG tablet Take 1 tablet (20 mg total) by mouth daily as needed. 90 tablet 3   gabapentin (NEURONTIN) 300 MG capsule Take 1 capsule (300 mg total) by mouth 3 (three) times daily. 270 capsule 3   hydrocortisone valerate ointment (WEST-CORT) 0.2 % APPLY TOPICALLY TO AFFECTED  AREA(S) TWICE DAILY 120 g 1   hydrOXYzine (VISTARIL) 25 MG capsule Take 1 capsule (25 mg total) by mouth every 8 (eight) hours as needed for itching. 60 capsule 0   Ixekizumab (TALTZ) 80 MG/ML SOAJ Inject 80 mg into the skin every 28 (twenty-eight) days. For maintenance. 1 mL 4   LANTUS SOLOSTAR 100 UNIT/ML Solostar Pen INJECT SUBCUTANEOUSLY 22 UNITS  DAILY 30 mL 3   losartan (COZAAR) 25 MG tablet TAKE 1 TABLET(25 MG) BY MOUTH DAILY 90 tablet 3   metFORMIN (GLUCOPHAGE) 1000 MG tablet TAKE 1 TABLET BY MOUTH TWICE  DAILY WITH MEALS 180 tablet 3   Multiple Vitamin (MULTIVITAMIN WITH MINERALS) TABS tablet Take 1 tablet by mouth daily. 30 tablet 2   omeprazole (PRILOSEC) 20 MG capsule TAKE 1 CAPSULE BY MOUTH TWICE  DAILY BEFORE MEALS (Patient taking differently: Take 20 mg by mouth daily.) 180 capsule 3   simvastatin (  ZOCOR) 40 MG tablet TAKE 1 TABLET BY MOUTH AT  BEDTIME 90 tablet 3   SYRINGE-NEEDLE, DISP, 3 ML (BD SAFETYGLIDE SYRINGE/NEEDLE) 25G X 1" 3 ML MISC Use the needle for IM injection once a  month; or as directed. 30 each 1   tamsulosin (FLOMAX) 0.4 MG CAPS capsule TAKE 1 CAPSULE BY MOUTH DAILY 90 capsule 3   triamcinolone lotion (KENALOG) 0.1 % APPLY TO AFFECTED AREA(S)  TOPICALLY 3 TIMES DAILY 180 mL 1   No current facility-administered medications for this visit.    Past Medical History:  Diagnosis Date   Adenomatous polyp    Allergy    Anemia    Arthritis    feet    Bunion 04/02/2013   STATUS POST OP BUN REPAIR   Diabetes mellitus    Foot ulcer (HCC)    GERD (gastroesophageal reflux disease)    Gout    Hammertoe    Hyperlipidemia     Hypertension    Neuromuscular disorder (HCC)    neuropathy   Plantar flexed metatarsal    Psoriasis    Schatzki's ring     Past Surgical History:  Procedure Laterality Date   AMPUTATION TOE Left 05/08/2023   Procedure: AMPUTATION 5TH TOE WITH PARTIAL RAY RESECTION;  Surgeon: Linus Galas, DPM;  Location: [facility];  Service: Orthopedics/Podiatry;  Laterality: Left;   CARPAL TUNNEL RELEASE Left 08/24/2022   Procedure: CARPAL TUNNEL RELEASE;  Surgeon: Deeann Saint, MD;  Location: [facility];  Service: Orthopedics;  Laterality: Left;   CATARACT EXTRACTION W/PHACO Left 03/21/2023   Procedure: CATARACT EXTRACTION PHACO AND INTRAOCULAR LENS PLACEMENT (IOC) LEFT MALYUGIN DIABETIC 9.88 1:01.4;  Surgeon: Lockie Mola, MD;  Location: [facility];  Service: Ophthalmology;  Laterality: Left;   CATARACT EXTRACTION W/PHACO Right 04/04/2023   Procedure: CATARACT EXTRACTION PHACO AND INTRAOCULAR LENS PLACEMENT (IOC) RIGHT MALYUGIN DIABETIC 15.31 01:15.1;  Surgeon: Lockie Mola, MD;  Location: [facility];  Service: Ophthalmology;  Laterality: Right;   COLONOSCOPY     FOOT AMPUTATION Right 01/11/2017   Dr Deborah Chalk ([facility])--transmetatarsal   FOOT SURGERY Right 02/28/2013   Arta Bruce HAM TOE2,3 PINS ,2ND MET OSTEOTOMY, 5TH MET W/SCREW   [phone], 04-2013   HIP FRACTURE SURGERY  1993   surgery x2   IR BONE MARROW BIOPSY & ASPIRATION  04/27/2023   LOWER EXTREMITY ANGIOGRAPHY Left 05/07/2023   Procedure: Lower Extremity Angiography;  Surgeon: Annice Needy, MD;  Location: [facility];  Service: Cardiovascular;  Laterality: Left;   NASAL SEPTOPLASTY W/ TURBINOPLASTY Bilateral 02/08/2023   Procedure: NASAL SEPTOPLASTY WITH TURBINATE REDUCTION;  Surgeon: Vernie Murders, MD;  Location: [facility];  Service: ENT;  Laterality: Bilateral;   POLYPECTOMY     PROSTATE BIOPSY  10/10/2019   TOTAL HIP ARTHROPLASTY     Left hip replacement/femur fx 07/04, Screw removal left hip-  [facility] 11/01     Social History   Tobacco Use   Smoking status: Never    Passive exposure: Past   Smokeless tobacco: Never  Vaping Use   Vaping status: Never Used  Substance Use Topics   Alcohol use: Yes    Alcohol/week: 1.0 standard drink of alcohol    Types: 1 Standard drinks or equivalent per week    Comment: occasional (DWI 1991)   Drug use: No       Family History  Problem Relation Age of Onset   Diabetes Mother    Diabetes Brother    Alzheimer's disease Maternal Aunt  Alzheimer's disease Cousin    Cancer Neg Hx    Colon cancer Neg Hx    Colon polyps Neg Hx    Rectal cancer Neg Hx    Stomach cancer Neg Hx    Esophageal cancer Neg Hx      Allergies  Allergen Reactions   Glipizide Other (See Comments)    REACTION: wt. gain, hands tingling   Lisinopril Other (See Comments)    HYPERKALEMIA   Ramipril Cough   Ibuprofen     UNSPECIFIED REACTION    Bactrim [Sulfamethoxazole-Trimethoprim] Nausea And Vomiting and Rash     REVIEW OF SYSTEMS (Negative unless checked)  Constitutional: [] Weight loss  [] Fever  [] Chills Cardiac: [] Chest pain   [] Chest pressure   [] Palpitations   [] Shortness of breath when laying flat   [] Shortness of breath at rest   [] Shortness of breath with exertion. Vascular:  [] Pain in legs with walking   [] Pain in legs at rest   [] Pain in legs when laying flat   [] Claudication   [] Pain in feet when walking  [] Pain in feet at rest  [] Pain in feet when laying flat   [] History of DVT   [] Phlebitis   [] Swelling in legs   [] Varicose veins   [x] Non-healing ulcers Pulmonary:   [] Uses home oxygen   [] Productive cough   [] Hemoptysis   [] Wheeze  [] COPD   [] Asthma Neurologic:  [] Dizziness  [] Blackouts   [] Seizures   [] History of stroke   [] History of TIA  [] Aphasia   [] Temporary blindness   [] Dysphagia   [] Weakness or numbness in arms   [] Weakness or numbness in legs Musculoskeletal:  [x] Arthritis   [] Joint swelling   [] Joint pain   [] Low back  pain Hematologic:  [] Easy bruising  [] Easy bleeding   [] Hypercoagulable state   [x] Anemic   Gastrointestinal:  [] Blood in stool   [] Vomiting blood  [x] Gastroesophageal reflux/heartburn   [] Abdominal pain Genitourinary:  [] Chronic kidney disease   [] Difficult urination  [] Frequent urination  [] Burning with urination   [] Hematuria Skin:  [] Rashes   [x] Ulcers   [x] Wounds Psychological:  [] History of anxiety   []  History of major depression.  Physical Examination  BP 138/73   Pulse 89   Resp 18   Ht 6' (1.829 m)   Wt 225 lb 9.6 oz (102.3 kg)   BMI 30.60 kg/m  Gen:  WD/WN, NAD Head: [place]/AT, No temporalis wasting. Ear/Nose/Throat: Hearing grossly intact, nares w/o erythema or drainage Eyes: Conjunctiva clear. Sclera non-icteric Neck: Supple.  Trachea midline Pulmonary:  Good air movement, no use of accessory muscles.  Cardiac: RRR, no JVD Vascular:  Vessel Right Left  Radial Palpable Palpable                          PT Palpable Palpable  DP Palpable Palpable   Gastrointestinal: soft, non-tender/non-distended. No guarding/reflex.  Musculoskeletal: M/S 5/5 throughout.  No deformity or atrophy. Trace LE edema. Neurologic: Sensation grossly intact in extremities.  Symmetrical.  Speech is fluent.  Psychiatric: Judgment intact, Mood & affect appropriate for pt's clinical situation.       Labs Recent Results (from the past 2160 hours)  Surgical pathology     Status: None   Collection Time: 04/27/23 12:00 AM  Result Value Ref Range   SURGICAL PATHOLOGY      Surgical Pathology CASE: [id number] PATIENT: Kielan Rabbani Bone Marrow Report     Clinical History: Anemia     DIAGNOSIS:  BONE MARROW, ASPIRATE,  CLOT, CORE: -Variably cellular bone marrow with trilineage hematopoiesis -See comment  PERIPHERAL BLOOD: -Normocytic anemia  COMMENT:  The bone marrow is generally normocellular for age with trilineage hematopoiesis.  There are mild nonspecific changes  present, likely secondary in nature.  Correlation with cytogenetic studies is recommended.  MICROSCOPIC DESCRIPTION:  PERIPHERAL BLOOD SMEAR: The red blood cells display mild anisopoikilocytosis with mild polychromasia.  The Digirolamo blood cells are normal in number with scattered neutrophils displaying mild toxic granulation.  The platelets are normal in number.  BONE MARROW ASPIRATE: Bone marrow particles present Erythroid precursors: Progressive maturation with only occasional late precursors displaying nuclear cytoplasmic dyssynchrony Granulocytic precur sors: Progressive maturation with scattered mature neutrophils displaying mild toxic granulation Megakaryocytes: Abundant with scattered large or small and/or hypolobated forms Lymphocytes/plasma cells: Large aggregates not present  TOUCH PREPARATIONS: A mixture of cell types present  CLOT AND BIOPSY: The sections show cellularity ranging from 20 to 50% focally and shows a mixture of cell types.  A small interstitial lymphoid aggregate composed of small lymphoid cells is seen.  No granulomata are present.  IRON STAIN: Iron stains are performed on a bone marrow aspirate or touch imprint smear and section of clot. The controls stained appropriately.       Storage Iron: Present      Ring Sideroblasts: Absent  ADDITIONAL DATA/TESTING: The specimen was sent for cytogenetic analysis and a separate report will follow.  CELL COUNT DATA:  Bone Marrow count performed on 500 cells shows: Blasts:   0%   Myeloid:  51% Promyelocytes: 3%   Erythroid:     41% Myelocytes:     15%  Lymphocytes:   6% Metamyelocytes:     5%   Plasma cells:  1% Bands:    5% Neutrophils:   16%  M:E ratio:     1.24 Eosinophils:   7% Basophils:     0% Monocytes:     1%  Lab Data: CBC performed on 04/27/2023 shows: WBC: 6.6 k/uL  Neutrophils:   69% Hgb: 11 g/dL   Lymphocytes:   78% HCT: 30.4 %    Monocytes:     8% MCV: 95 fL     Eosinophils:   4% RDW:  13.2 %    Basophils:     1% PLT: 318 k/uL    GROSS DESCRIPTION:  A. Aspirate smear.  B. Received in B-plus labeled with the patient's name and DOB is a 1.5 x 0.5 x 0.2 cm blood clot, submitted in toto in cassette B1.  C. Received in B-plus labeled with the patient's name and DOB is a 1.0 x 0.2 cm core(s) of tan-brown, trabeculated, firm bone, submitted in toto in cassette C1 after decalcification in Immunocal.  (LEF 04/27/2023)   Final Diagnosis performed by Guerry Bruin, MD.   Electronically signed 04/30/2023 Technical and / or Professional components performed at [facility], [address], [place], [place] [address].  Immunohistochemistry Technical component (if applicable) was performed at [facility]. [address], [place], [place] [address].   IMMUNOHISTOCHEMISTRY DISCLAIMER (if applicable): Some of these immunohistochemical stains may have been developed and the performance characteristics determine by [facility]. Some may not have been cleared or approved by the U.S. Food and Drug Administration. The FDA has determined that such clearance or approval is not necessary. This test is used for clinical purposes. It should not be regarded as investigational or for research. This laboratory is certified under the Clinical  Laboratory Improvement Amendments of 1988 (CLIA-88) as qualified to perform high complexity clinical laboratory testing.  The controls stained appropriately.   IHC stains are performed on formalin fixed, paraffin embedded tissue using a 3,3"diaminobenzidine (DAB) chromogen and [employer]. The staining intensity of the nucleus is score manually and is reported as the percentage of tumor cell nuclei demonstrating specific nuclear staining. The specimens are fixed in 10% Neutral Formalin for at least 6 hours and up to 72hrs. These tests are validated on decalcified tissue.  Results should be interpreted with caution given the possibility of false negative results on decalcified specimens. Antibody Clones are as follows ER-clone 80F, PR-clone 16, Ki67- clone MM1. Some of these immunohistochemical stains may have been developed and the performance characteristics determined by [facility].   CBC with Differential/Platelet     Status: Abnormal   Collection Time: 04/27/23  7:48 AM  Result Value Ref Range   WBC 6.6 4.0 - 10.5 K/uL   RBC 3.20 (L) 4.22 - 5.81 MIL/uL   Hemoglobin 11.0 (L) 13.0 - 17.0 g/dL   HCT 02.7 (L) 25.3 - 66.4 %   MCV 95.0 80.0 - 100.0 fL   MCH 34.4 (H) 26.0 - 34.0 pg   MCHC 36.2 (H) 30.0 - 36.0 g/dL   RDW 40.3 47.4 - 25.9 %   Platelets 318 150 - 400 K/uL   nRBC 0.0 0.0 - 0.2 %   Neutrophils Relative % 64 %   Neutro Abs 4.3 1.7 - 7.7 K/uL   Lymphocytes Relative 20 %   Lymphs Abs 1.3 0.7 - 4.0 K/uL   Monocytes Relative 9 %   Monocytes Absolute 0.6 0.1 - 1.0 K/uL   Eosinophils Relative 5 %   Eosinophils Absolute 0.3 0.0 - 0.5 K/uL   Basophils Relative 1 %   Basophils Absolute 0.0 0.0 - 0.1 K/uL   Immature Granulocytes 1 %   Abs Immature Granulocytes 0.03 0.00 - 0.07 K/uL    Comment: Performed at [facility], [address] [place], [place] [address]  Glucose, capillary     Status: Abnormal   Collection Time: 04/27/23  7:56 AM  Result Value Ref Range   Glucose-Capillary 117 (H) 70 - 99 mg/dL    Comment: Glucose reference range applies only to samples taken after fasting for at least 8 hours.  Aerobic/Anaerobic Culture w Gram Stain (surgical/deep wound)     Status: None   Collection Time: 05/03/23  9:59 AM   Specimen: Wound  Result Value Ref Range   Specimen Description      WOUND Performed at [facility], [address], [place], [place] [address]    Special Requests      NONE Performed at [facility], [address] [place], [place] [address]    Gram Stain      RARE WBC PRESENT,  PREDOMINANTLY PMN NO ORGANISMS SEEN    Culture      RARE PSEUDOMONAS AERUGINOSA MODERATE CORYNEBACTERIUM STRIATUM Standardized susceptibility testing for this organism is not available. NO ANAEROBES ISOLATED Performed at [facility], [address] [place], [place] [address]    Report Status 05/08/2023 FINAL    Organism ID, Bacteria PSEUDOMONAS AERUGINOSA       Susceptibility   Pseudomonas aeruginosa - MIC*    CEFTAZIDIME 4 SENSITIVE Sensitive     CIPROFLOXACIN <=0.25 SENSITIVE Sensitive     GENTAMICIN 2 SENSITIVE Sensitive     IMIPENEM 2 SENSITIVE Sensitive  PIP/TAZO 8 SENSITIVE Sensitive ug/mL    CEFEPIME 4 SENSITIVE Sensitive     * RARE PSEUDOMONAS AERUGINOSA  Lactic acid, plasma     Status: None   Collection Time: 05/05/23 12:01 PM  Result Value Ref Range   Lactic Acid, Venous 1.6 0.5 - 1.9 mmol/L    Comment: Performed at [facility], [address] [place], [place] [address]  Comprehensive metabolic panel     Status: Abnormal   Collection Time: 05/05/23 12:01 PM  Result Value Ref Range   Sodium 127 (L) 135 - 145 mmol/L   Potassium 3.3 (L) 3.5 - 5.1 mmol/L   Chloride 83 (L) 98 - 111 mmol/L   CO2 29 22 - 32 mmol/L   Glucose, Bld 256 (H) 70 - 99 mg/dL    Comment: Glucose reference range applies only to samples taken after fasting for at least 8 hours.   BUN 10 8 - 23 mg/dL   Creatinine, Ser 5.78 0.61 - 1.24 mg/dL   Calcium 9.0 8.9 - 46.9 mg/dL   Total Protein 7.3 6.5 - 8.1 g/dL   Albumin 3.8 3.5 - 5.0 g/dL   AST 20 15 - 41 U/L   ALT 11 0 - 44 U/L   Alkaline Phosphatase 86 38 - 126 U/L   Total Bilirubin 1.1 0.3 - 1.2 mg/dL   GFR, Estimated >62 >95 mL/min    Comment: (NOTE) Calculated using the CKD-EPI Creatinine Equation (2021)    Anion gap 15 5 - 15    Comment: Performed at [facility], [address] [place], [place] [address]  CBC with Differential     Status: Abnormal   Collection Time: 05/05/23 12:01 PM  Result Value Ref  Range   WBC 5.2 4.0 - 10.5 K/uL   RBC 3.26 (L) 4.22 - 5.81 MIL/uL   Hemoglobin 10.8 (L) 13.0 - 17.0 g/dL   HCT 24.4 (L) 01.0 - 27.2 %   MCV 97.5 80.0 - 100.0 fL   MCH 33.1 26.0 - 34.0 pg   MCHC 34.0 30.0 - 36.0 g/dL   RDW 53.6 64.4 - 03.4 %   Platelets 352 150 - 400 K/uL   nRBC 0.0 0.0 - 0.2 %   Neutrophils Relative % 66 %   Neutro Abs 3.5 1.7 - 7.7 K/uL   Lymphocytes Relative 22 %   Lymphs Abs 1.2 0.7 - 4.0 K/uL   Monocytes Relative 8 %   Monocytes Absolute 0.4 0.1 - 1.0 K/uL   Eosinophils Relative 3 %   Eosinophils Absolute 0.2 0.0 - 0.5 K/uL   Basophils Relative 1 %   Basophils Absolute 0.0 0.0 - 0.1 K/uL   Immature Granulocytes 0 %   Abs Immature Granulocytes 0.01 0.00 - 0.07 K/uL    Comment: Performed at [facility], [address] [place], [place] [address]  Sedimentation rate     Status: Abnormal   Collection Time: 05/05/23 12:01 PM  Result Value Ref Range   Sed Rate 60 (H) 0 - 20 mm/hr    Comment: Performed at [facility], [address] [place], [place] [address]  Magnesium     Status: Abnormal   Collection Time: 05/05/23 12:01 PM  Result Value Ref Range   Magnesium 1.5 (L) 1.7 - 2.4 mg/dL    Comment: Performed at [facility], [address] [place], [place] [address]  Urinalysis, w/ Reflex to Culture (Infection Suspected) -Urine, Clean Catch     Status: Abnormal   Collection  Time: 05/05/23  3:58 PM  Result Value Ref Range   Specimen Source URINE, CLEAN CATCH    Color, Urine YELLOW (A) YELLOW   APPearance CLEAR (A) CLEAR   Specific Gravity, Urine 1.006 1.005 - 1.030   pH 7.0 5.0 - 8.0   Glucose, UA NEGATIVE NEGATIVE mg/dL   Hgb urine dipstick NEGATIVE NEGATIVE   Bilirubin Urine NEGATIVE NEGATIVE   Ketones, ur NEGATIVE NEGATIVE mg/dL   Protein, ur NEGATIVE NEGATIVE mg/dL   Nitrite NEGATIVE NEGATIVE   Leukocytes,Ua NEGATIVE NEGATIVE   RBC / HPF 0-5 0 - 5 RBC/hpf   WBC, UA 0-5 0 - 5 WBC/hpf    Comment:        Reflex urine  culture not performed if WBC <=10, OR if Squamous epithelial cells >5. If Squamous epithelial cells >5 suggest recollection.    Bacteria, UA NONE SEEN NONE SEEN   Squamous Epithelial / HPF 0-5 0 - 5 /HPF    Comment: Performed at [facility], [address] [place], [place] [address]  HIV Antibody (routine testing w rflx)     Status: None   Collection Time: 05/05/23  3:58 PM  Result Value Ref Range   HIV Screen 4th Generation wRfx Non Reactive Non Reactive    Comment: Performed at [facility], [address], [place], [place] [address]  Prealbumin     Status: Abnormal   Collection Time: 05/05/23  3:58 PM  Result Value Ref Range   Prealbumin 17 (L) 18 - 38 mg/dL    Comment: Performed at [facility], [address], [place], [place] [address]  Culture, blood (Routine X 2) w Reflex to ID Panel     Status: None   Collection Time: 05/05/23  4:38 PM   Specimen: BLOOD LEFT ARM  Result Value Ref Range   Specimen Description BLOOD LEFT ARM    Special Requests      BOTTLES DRAWN AEROBIC AND ANAEROBIC Blood Culture results may not be optimal due to an excessive volume of blood received in culture bottles   Culture      NO GROWTH 5 DAYS Performed at [facility], [address] [place], [place] [address]    Report Status 05/10/2023 FINAL   Culture, blood (Routine X 2) w Reflex to ID Panel     Status: None   Collection Time: 05/05/23  4:38 PM   Specimen: BLOOD RIGHT ARM  Result Value Ref Range   Specimen Description BLOOD RIGHT ARM    Special Requests      BOTTLES DRAWN AEROBIC AND ANAEROBIC Blood Culture results may not be optimal due to an excessive volume of blood received in culture bottles   Culture      NO GROWTH 5 DAYS Performed at [facility], [address], [place], [place] [address]    Report Status 05/10/2023 FINAL   C-reactive protein     Status: Abnormal   Collection Time: 05/05/23  4:38 PM  Result Value Ref Range   CRP 3.4  (H) <1.0 mg/dL    Comment: Performed at [facility], [address], [place], [place] [address]  CBG monitoring, ED     Status: Abnormal   Collection Time: 05/05/23 10:45 PM  Result Value Ref Range   Glucose-Capillary 173 (H) 70 - 99 mg/dL    Comment: Glucose reference range applies only to samples taken after fasting for at least 8 hours.  CBG monitoring, ED  Status: Abnormal   Collection Time: 05/06/23  7:35 AM  Result Value Ref Range   Glucose-Capillary 124 (H) 70 - 99 mg/dL    Comment: Glucose reference range applies only to samples taken after fasting for at least 8 hours.  Glucose, capillary     Status: Abnormal   Collection Time: 05/06/23 12:21 PM  Result Value Ref Range   Glucose-Capillary 163 (H) 70 - 99 mg/dL    Comment: Glucose reference range applies only to samples taken after fasting for at least 8 hours.  Glucose, capillary     Status: Abnormal   Collection Time: 05/06/23  4:36 PM  Result Value Ref Range   Glucose-Capillary 160 (H) 70 - 99 mg/dL    Comment: Glucose reference range applies only to samples taken after fasting for at least 8 hours.  Glucose, capillary     Status: Abnormal   Collection Time: 05/06/23  9:04 PM  Result Value Ref Range   Glucose-Capillary 181 (H) 70 - 99 mg/dL    Comment: Glucose reference range applies only to samples taken after fasting for at least 8 hours.  Basic metabolic panel     Status: Abnormal   Collection Time: 05/07/23  4:42 AM  Result Value Ref Range   Sodium 131 (L) 135 - 145 mmol/L   Potassium 3.4 (L) 3.5 - 5.1 mmol/L   Chloride 88 (L) 98 - 111 mmol/L   CO2 30 22 - 32 mmol/L   Glucose, Bld 178 (H) 70 - 99 mg/dL    Comment: Glucose reference range applies only to samples taken after fasting for at least 8 hours.   BUN 8 8 - 23 mg/dL   Creatinine, Ser 6.57 0.61 - 1.24 mg/dL   Calcium 8.9 8.9 - 84.6 mg/dL   GFR, Estimated >96 >29 mL/min    Comment: (NOTE) Calculated using the CKD-EPI Creatinine Equation (2021)     Anion gap 13 5 - 15    Comment: Performed at [facility], [address], [place], [place] [address]  Magnesium     Status: Abnormal   Collection Time: 05/07/23  4:42 AM  Result Value Ref Range   Magnesium 1.4 (L) 1.7 - 2.4 mg/dL    Comment: Performed at [facility], [address], [place], [place] [address]  Phosphorus     Status: None   Collection Time: 05/07/23  4:42 AM  Result Value Ref Range   Phosphorus 3.7 2.5 - 4.6 mg/dL    Comment: Performed at [facility], [address] [place], [place] [address]  Glucose, capillary     Status: Abnormal   Collection Time: 05/07/23  7:32 AM  Result Value Ref Range   Glucose-Capillary 165 (H) 70 - 99 mg/dL    Comment: Glucose reference range applies only to samples taken after fasting for at least 8 hours.  Glucose, capillary     Status: Abnormal   Collection Time: 05/07/23 12:44 PM  Result Value Ref Range   Glucose-Capillary 177 (H) 70 - 99 mg/dL    Comment: Glucose reference range applies only to samples taken after fasting for at least 8 hours.  Glucose, capillary     Status: Abnormal   Collection Time: 05/07/23  4:27 PM  Result Value Ref Range   Glucose-Capillary 188 (H) 70 - 99 mg/dL    Comment: Glucose reference range applies only to samples taken after fasting for at least 8 hours.  Glucose, capillary     Status: Abnormal  Collection Time: 05/07/23  6:45 PM  Result Value Ref Range   Glucose-Capillary 268 (H) 70 - 99 mg/dL    Comment: Glucose reference range applies only to samples taken after fasting for at least 8 hours.  Glucose, capillary     Status: Abnormal   Collection Time: 05/07/23  9:45 PM  Result Value Ref Range   Glucose-Capillary 220 (H) 70 - 99 mg/dL    Comment: Glucose reference range applies only to samples taken after fasting for at least 8 hours.  Surgical pathology     Status: None   Collection Time: 05/08/23 12:00 AM  Result Value Ref Range   SURGICAL PATHOLOGY       SURGICAL PATHOLOGY [facility] [address], [address] [place], [place] [address] Telephone [phone] or [phone] Fax [phone]  REPORT OF SURGICAL PATHOLOGY   Accession #: [id number] Patient Name: NICHALAS, SHUPE Visit # : [id number]  MRN: [id number] Physician: Linus Galas DOB/Age [birth year] (Age: 70) Gender: M Collected Date: 05/08/2023 Received Date: 05/08/2023  FINAL DIAGNOSIS       1. Toe(s), amputation, Left 5th, and metatarsal :       - SOFT TISSUE WITH FIBROSIS.      - BONE WITH REACTIVE CHANGES.      - PROXIMAL MARGIN WITH VIABLE CUT SURFACE OF BONE; NEGATIVE FOR ACUTE      OSTEOMYELITIS.       DATE SIGNED OUT: 05/11/2023 ELECTRONIC SIGNATURE : Oneita Kras Md, Delice Bison , Pathologist, Electronic Signature  MICROSCOPIC DESCRIPTION  CASE COMMENTS STAINS USED IN DIAGNOSIS: H&E H&E *RECUT 2 SLIDES *RECUT 2 SLIDES *RECUT 2 SLIDES *RECUT 2 SLIDES    CLINICAL HISTORY  SPECIMEN(S) OBTAINED 1. Toe(s), amputation, L eft 5th, And Metatarsal  SPECIMEN COMMENTS: SPECIMEN CLINICAL INFORMATION: 1. Osteomyelitis    Gross Description 1. Received in formalin, labeled "left 5th toe and metatarsal", is a 3.5 x 2.0 x 0.8 cm toe amputation, received with attached 3.5 cm long proximal phalangeal bone and a 5.0 cm long segment of metatarsal bone. The metatarsal bone features a smooth, regular resection margin. There is an ill-defined 4.3 cm well-healed, puckered and scar-like lesion on the superior surface of the toe, 0.7 cm from the nearest soft tissue margin. The remaining toe features soft pale tan skin and an unremarkable toenail. The soft tissue resection margin appears viable.      Sectioning of the bone underlying skin lesion reveals diffusely firm, viable      bone. The bony resection margin is diffusely firm. Representative sections are      submitted as follows:      1A: Skin lesion, perpendicular to margin (margin inked black)       1B: Metatarsal bone margin fragments, following d ecalcification      SMB      05/09/2023        Report signed out from the following location(s) [facility] [address], [place] [address] CLIA #: [id number]  [facility] [address] [place], [place] [address] CLIA #: [id number]   Vancomycin, random     Status: None   Collection Time: 05/08/23  5:20 AM  Result Value Ref Range   Vancomycin Rm 19 ug/mL    Comment:        Random Vancomycin therapeutic range is dependent on dosage and time of specimen collection. A peak range is 20-40 ug/mL A trough range is 5-15 ug/mL  Performed at [facility], [address] [place], [place] [address]   Glucose, capillary     Status: Abnormal   Collection Time: 05/08/23  7:42 AM  Result Value Ref Range   Glucose-Capillary 172 (H) 70 - 99 mg/dL    Comment: Glucose reference range applies only to samples taken after fasting for at least 8 hours.  Glucose, capillary     Status: Abnormal   Collection Time: 05/08/23  8:14 AM  Result Value Ref Range   Glucose-Capillary 175 (H) 70 - 99 mg/dL    Comment: Glucose reference range applies only to samples taken after fasting for at least 8 hours.  Glucose, capillary     Status: Abnormal   Collection Time: 05/08/23 11:25 AM  Result Value Ref Range   Glucose-Capillary 127 (H) 70 - 99 mg/dL    Comment: Glucose reference range applies only to samples taken after fasting for at least 8 hours.  Aerobic/Anaerobic Culture w Gram Stain (surgical/deep wound)     Status: None   Collection Time: 05/08/23  1:08 PM   Specimen: Wound; Tissue  Result Value Ref Range   Specimen Description      WOUND LEFT 5TH METATARSAL Performed at [facility], [address] [place], [place] [address]    Special Requests      NONE Performed at [facility], [address] [place], [place] [address]    Gram Stain      RARE WBC  PRESENT, PREDOMINANTLY MONONUCLEAR NO ORGANISMS SEEN    Culture      No growth aerobically or anaerobically. Performed at [facility], [address] [place], [place] [address]    Report Status 05/13/2023 FINAL   Glucose, capillary     Status: Abnormal   Collection Time: 05/08/23  1:42 PM  Result Value Ref Range   Glucose-Capillary 159 (H) 70 - 99 mg/dL    Comment: Glucose reference range applies only to samples taken after fasting for at least 8 hours.  Glucose, capillary     Status: Abnormal   Collection Time: 05/08/23  4:26 PM  Result Value Ref Range   Glucose-Capillary 264 (H) 70 - 99 mg/dL    Comment: Glucose reference range applies only to samples taken after fasting for at least 8 hours.  Glucose, capillary     Status: Abnormal   Collection Time: 05/08/23  9:24 PM  Result Value Ref Range   Glucose-Capillary 199 (H) 70 - 99 mg/dL    Comment: Glucose reference range applies only to samples taken after fasting for at least 8 hours.   Comment 1 Notify RN   CBC     Status: Abnormal   Collection Time: 05/09/23  4:45 AM  Result Value Ref Range   WBC 4.9 4.0 - 10.5 K/uL   RBC 2.71 (L) 4.22 - 5.81 MIL/uL   Hemoglobin 8.9 (L) 13.0 - 17.0 g/dL   HCT 36.6 (L) 44.0 - 34.7 %   MCV 97.4 80.0 - 100.0 fL   MCH 32.8 26.0 - 34.0 pg   MCHC 33.7 30.0 - 36.0 g/dL   RDW 42.5 95.6 - 38.7 %   Platelets 275 150 - 400 K/uL   nRBC 0.0 0.0 - 0.2 %    Comment: Performed at [facility], [address] [place], [place] [address]  Basic metabolic panel     Status: Abnormal   Collection Time: 05/09/23  4:45 AM  Result Value Ref Range   Sodium 127 (  L) 135 - 145 mmol/L   Potassium 3.4 (L) 3.5 - 5.1 mmol/L   Chloride 94 (L) 98 - 111 mmol/L   CO2 25 22 - 32 mmol/L   Glucose, Bld 209 (H) 70 - 99 mg/dL    Comment: Glucose reference range applies only to samples taken after fasting for at least 8 hours.   BUN 13 8 - 23 mg/dL   Creatinine, Ser 4.09 0.61 - 1.24 mg/dL   Calcium 8.2  (L) 8.9 - 10.3 mg/dL   GFR, Estimated >81 >19 mL/min    Comment: (NOTE) Calculated using the CKD-EPI Creatinine Equation (2021)    Anion gap 8 5 - 15    Comment: Performed at [facility], [address] [place], [place] [address]  Magnesium     Status: Abnormal   Collection Time: 05/09/23  4:45 AM  Result Value Ref Range   Magnesium 1.3 (L) 1.7 - 2.4 mg/dL    Comment: Performed at [facility], [address] [place], [place] [address]  Glucose, capillary     Status: Abnormal   Collection Time: 05/09/23  7:36 AM  Result Value Ref Range   Glucose-Capillary 207 (H) 70 - 99 mg/dL    Comment: Glucose reference range applies only to samples taken after fasting for at least 8 hours.  Glucose, capillary     Status: Abnormal   Collection Time: 05/09/23 11:49 AM  Result Value Ref Range   Glucose-Capillary 322 (H) 70 - 99 mg/dL    Comment: Glucose reference range applies only to samples taken after fasting for at least 8 hours.  Glucose, capillary     Status: Abnormal   Collection Time: 05/09/23  5:17 PM  Result Value Ref Range   Glucose-Capillary 203 (H) 70 - 99 mg/dL    Comment: Glucose reference range applies only to samples taken after fasting for at least 8 hours.  Glucose, capillary     Status: Abnormal   Collection Time: 05/09/23  8:57 PM  Result Value Ref Range   Glucose-Capillary 219 (H) 70 - 99 mg/dL    Comment: Glucose reference range applies only to samples taken after fasting for at least 8 hours.   Comment 1 Notify RN   CBC     Status: Abnormal   Collection Time: 05/10/23  6:06 AM  Result Value Ref Range   WBC 4.2 4.0 - 10.5 K/uL   RBC 2.75 (L) 4.22 - 5.81 MIL/uL   Hemoglobin 8.9 (L) 13.0 - 17.0 g/dL   HCT 30.8 (L) 65.7 - 84.6 %   MCV 98.2 80.0 - 100.0 fL   MCH 32.4 26.0 - 34.0 pg   MCHC 33.0 30.0 - 36.0 g/dL   RDW 96.2 95.2 - 84.1 %   Platelets 259 150 - 400 K/uL   nRBC 0.0 0.0 - 0.2 %    Comment: Performed at [facility], [address] [place], [place] [address]  Basic metabolic panel     Status: Abnormal   Collection Time: 05/10/23  6:06 AM  Result Value Ref Range   Sodium 130 (L) 135 - 145 mmol/L   Potassium 3.6 3.5 - 5.1 mmol/L   Chloride 97 (L) 98 - 111 mmol/L   CO2 24 22 - 32 mmol/L   Glucose, Bld 189 (H) 70 - 99 mg/dL    Comment: Glucose reference range applies only to samples taken after fasting for at least 8 hours.   BUN 16 8 - 23 mg/dL  Creatinine, Ser 0.90 0.61 - 1.24 mg/dL   Calcium 8.5 (L) 8.9 - 10.3 mg/dL   GFR, Estimated >40 >98 mL/min    Comment: (NOTE) Calculated using the CKD-EPI Creatinine Equation (2021)    Anion gap 9 5 - 15    Comment: Performed at [facility], [address] [place], [place] [address]  Glucose, capillary     Status: Abnormal   Collection Time: 05/10/23  7:37 AM  Result Value Ref Range   Glucose-Capillary 184 (H) 70 - 99 mg/dL    Comment: Glucose reference range applies only to samples taken after fasting for at least 8 hours.  Glucose, capillary     Status: Abnormal   Collection Time: 05/10/23 11:19 AM  Result Value Ref Range   Glucose-Capillary 309 (H) 70 - 99 mg/dL    Comment: Glucose reference range applies only to samples taken after fasting for at least 8 hours.  CBC with Differential     Status: Abnormal   Collection Time: 06/06/23  8:31 AM  Result Value Ref Range   WBC 6.3 3.4 - 10.8 x10E3/uL   RBC 3.45 (L) 4.14 - 5.80 x10E6/uL   Hemoglobin 10.8 (L) 13.0 - 17.7 g/dL   Hematocrit 78.2 (L) 95.6 - 51.0 %   MCV 98 (H) 79 - 97 fL   MCH 31.3 26.6 - 33.0 pg   MCHC 32.0 31.5 - 35.7 g/dL   RDW 21.3 08.6 - 57.8 %   Platelets 327 150 - 450 x10E3/uL   Neutrophils 66 Not Estab. %   Lymphs 20 Not Estab. %   Monocytes 7 Not Estab. %   Eos 6 Not Estab. %   Basos 1 Not Estab. %   Neutrophils Absolute 4.1 1.4 - 7.0 x10E3/uL   Lymphocytes Absolute 1.3 0.7 - 3.1 x10E3/uL   Monocytes Absolute 0.5 0.1 - 0.9 x10E3/uL   EOS (ABSOLUTE) 0.4 0.0 - 0.4  x10E3/uL   Basophils Absolute 0.0 0.0 - 0.2 x10E3/uL   Immature Granulocytes 0 Not Estab. %   Immature Grans (Abs) 0.0 0.0 - 0.1 x10E3/uL  Hemoglobin A1c     Status: Abnormal   Collection Time: 06/06/23  8:31 AM  Result Value Ref Range   Hgb A1c MFr Bld 7.0 (H) 4.8 - 5.6 %    Comment:          Prediabetes: 5.7 - 6.4          Diabetes: >6.4          Glycemic control for adults with diabetes: <7.0    Est. average glucose Bld gHb Est-mCnc 154 mg/dL  Hepatic function panel     Status: None   Collection Time: 06/06/23  8:31 AM  Result Value Ref Range   Total Protein 6.3 6.0 - 8.5 g/dL   Bilirubin Total 0.3 0.0 - 1.2 mg/dL   Bilirubin, Direct 4.69 0.00 - 0.40 mg/dL   Alkaline Phosphatase 91 44 - 121 IU/L   AST 20 0 - 40 IU/L   ALT 18 0 - 44 IU/L  Renal function panel     Status: Abnormal   Collection Time: 06/06/23  8:31 AM  Result Value Ref Range   Glucose 144 (H) 70 - 99 mg/dL   BUN 19 8 - 27 mg/dL   Creatinine, Ser 6.29 0.76 - 1.27 mg/dL   eGFR 87 >52 WU/XLK/4.40   BUN/Creatinine Ratio 20 10 - 24   Sodium 139 134 - 144 mmol/L   Potassium 5.2 3.5 - 5.2 mmol/L  Chloride 100 96 - 106 mmol/L   CO2 23 20 - 29 mmol/L   Calcium 10.7 (H) 8.6 - 10.2 mg/dL   Phosphorus 4.8 (H) 2.8 - 4.1 mg/dL   Albumin 4.2 3.9 - 4.9 g/dL  TSH     Status: Abnormal   Collection Time: 06/06/23  8:31 AM  Result Value Ref Range   TSH 4.890 (H) 0.450 - 4.500 uIU/mL  PSA     Status: None   Collection Time: 06/20/23  8:04 AM  Result Value Ref Range   Prostate Specific Ag, Serum 0.6 0.0 - 4.0 ng/mL    Comment: Roche ECLIA methodology. According to the American Urological Association, Serum PSA should decrease and remain at undetectable levels after radical prostatectomy. The AUA defines biochemical recurrence as an initial PSA value 0.2 ng/mL or greater followed by a subsequent confirmatory PSA value 0.2 ng/mL or greater. Values obtained with different assay methods or kits cannot be  used interchangeably. Results cannot be interpreted as absolute evidence of the presence or absence of malignant disease.   Bladder Scan (Post Void Residual) in office     Status: None   Collection Time: 06/26/23  9:13 AM  Result Value Ref Range   Scan Result 132 ml     Radiology No results found.  Assessment/Plan  Essential hypertension, benign blood pressure control important in reducing the progression of atherosclerotic disease. On appropriate oral medications.   DM (diabetes mellitus) type II controlled, neurological manifestation (HCC) blood glucose control important in reducing the progression of atherosclerotic disease. Also, involved in wound healing. On appropriate medications.   Hyperlipemia lipid control important in reducing the progression of atherosclerotic disease. Continue statin therapy   Atherosclerosis of native arteries of the extremities with ulceration (HCC) His ABIs today are 1.06 on the right and 1.08 on the left with triphasic waveforms.  Revascularization has resulted in healing of wound on the left lower extremity.  Blood flow is adequate for right lower extremity wound healing.  Follow-up in 3 months with noninvasive studies.  Continue aspirin and statin agent.    Festus Barren, MD  07/10/2023 3:59 PM    This note was created with Dragon medical transcription system.  Any errors from dictation are purely unintentional

## 2023-07-10 NOTE — Assessment & Plan Note (Signed)
lipid control important in reducing the progression of atherosclerotic disease. Continue statin therapy  

## 2023-07-10 NOTE — Assessment & Plan Note (Signed)
blood glucose control important in reducing the progression of atherosclerotic disease. Also, involved in wound healing. On appropriate medications.  

## 2023-07-10 NOTE — Assessment & Plan Note (Signed)
His ABIs today are 1.06 on the right and 1.08 on the left with triphasic waveforms.  Revascularization has resulted in healing of wound on the left lower extremity.  Blood flow is adequate for right lower extremity wound healing.  Follow-up in 3 months with noninvasive studies.  Continue aspirin and statin agent.

## 2023-07-10 NOTE — Assessment & Plan Note (Signed)
blood pressure control important in reducing the progression of atherosclerotic disease. On appropriate oral medications.  

## 2023-07-12 LAB — VAS US ABI WITH/WO TBI
Left ABI: 1.08
Right ABI: 1.06

## 2023-07-19 ENCOUNTER — Other Ambulatory Visit: Payer: Self-pay | Admitting: Internal Medicine

## 2023-07-23 ENCOUNTER — Other Ambulatory Visit: Payer: Self-pay | Admitting: Internal Medicine

## 2023-08-13 ENCOUNTER — Other Ambulatory Visit: Payer: Self-pay | Admitting: Internal Medicine

## 2023-08-15 ENCOUNTER — Telehealth (INDEPENDENT_AMBULATORY_CARE_PROVIDER_SITE_OTHER): Payer: Self-pay

## 2023-08-15 NOTE — Telephone Encounter (Signed)
Patient left a message stating that for the past 6 months or longer his hands have been feeling cold/frozen. The patient is requesting to seen before his carpel tunnel surgery schedule for February 2025. Please Advise

## 2023-08-15 NOTE — Telephone Encounter (Signed)
Patient was notified with medical recommendations and verbalized understanding

## 2023-08-15 NOTE — Telephone Encounter (Signed)
They probably feel that way because of his carpal tunnel and/or neuropathy but he can come in with a bilateral arterial duplex to r/o perfusion issues.  Now depending on when his carpal tunnel surgery is, we can see if there is available to see him prior to that

## 2023-08-16 ENCOUNTER — Other Ambulatory Visit (INDEPENDENT_AMBULATORY_CARE_PROVIDER_SITE_OTHER): Payer: Self-pay | Admitting: Nurse Practitioner

## 2023-08-16 ENCOUNTER — Ambulatory Visit (INDEPENDENT_AMBULATORY_CARE_PROVIDER_SITE_OTHER): Payer: 59

## 2023-08-16 DIAGNOSIS — R209 Unspecified disturbances of skin sensation: Secondary | ICD-10-CM

## 2023-08-16 LAB — HM DIABETES EYE EXAM

## 2023-08-22 ENCOUNTER — Encounter: Payer: Self-pay | Admitting: Internal Medicine

## 2023-08-23 MED ORDER — DULOXETINE HCL 30 MG PO CPEP: 30.0000 mg | ORAL_CAPSULE | Freq: Every day | ORAL | 3 refills | Status: DC

## 2023-09-04 ENCOUNTER — Ambulatory Visit (INDEPENDENT_AMBULATORY_CARE_PROVIDER_SITE_OTHER): Payer: 59 | Admitting: Nurse Practitioner

## 2023-09-04 VITALS — BP 127/65 | HR 84 | Resp 16 | Wt 230.2 lb

## 2023-09-04 DIAGNOSIS — R209 Unspecified disturbances of skin sensation: Secondary | ICD-10-CM | POA: Diagnosis not present

## 2023-09-04 DIAGNOSIS — I10 Essential (primary) hypertension: Secondary | ICD-10-CM

## 2023-09-04 DIAGNOSIS — E1140 Type 2 diabetes mellitus with diabetic neuropathy, unspecified: Secondary | ICD-10-CM

## 2023-09-04 DIAGNOSIS — Z9889 Other specified postprocedural states: Secondary | ICD-10-CM

## 2023-09-04 DIAGNOSIS — I739 Peripheral vascular disease, unspecified: Secondary | ICD-10-CM

## 2023-09-04 DIAGNOSIS — Z794 Long term (current) use of insulin: Secondary | ICD-10-CM

## 2023-09-04 NOTE — Progress Notes (Signed)
Subjective:    Patient ID: [name], male    DOB: 12/28/1955, 68 y.o.   MRN: [id number] Chief Complaint  Patient presents with   Follow-up    Ultrasound results    [name] is a 68 year old male who presents today for evaluation of of cold and numb and painful hands.  He notes that this has been ongoing for several months but it has recently worsened.  He recently underwent revascularization of his right lower extremity and wanted to rule out possible blood flow issues as a contributing cause to his discomfort.  Currently has no open wounds or ulcerations.  He has had a history of carpal tunnel surgery.  Today noninvasive studies show triphasic waveforms throughout the entire bilateral upper extremities.  He has abnormal waveforms in the digits of the first and fifth fingers on the right hand but otherwise all digit waveforms are normal.    Review of Systems  Skin:  Positive for wound.  Neurological:  Positive for numbness.  All other systems reviewed and are negative.      Objective:   Physical Exam Vitals reviewed.  HENT:     Head: Normocephalic.  Cardiovascular:     Rate and Rhythm: Normal rate.     Pulses:          Radial pulses are 2+ on the right side and 2+ on the left side.  Pulmonary:     Effort: Pulmonary effort is normal.  Skin:    General: Skin is warm and dry.  Neurological:     Mental Status: He is alert and oriented to person, place, and time.  Psychiatric:        Mood and Affect: Mood normal.        Behavior: Behavior normal.        Thought Content: Thought content normal.        Judgment: Judgment normal.     BP 127/65   Pulse 84   Resp 16   Wt 230 lb 3.2 oz (104.4 kg)   BMI 31.22 kg/m   Past Medical History:  Diagnosis Date   Adenomatous polyp    Allergy    Anemia    Arthritis    feet    Bunion 04/02/2013   STATUS POST OP BUN REPAIR   Diabetes mellitus    Foot ulcer (HCC)    GERD (gastroesophageal reflux disease)    Gout     Hammertoe    Hyperlipidemia    Hypertension    Neuromuscular disorder (HCC)    neuropathy   Plantar flexed metatarsal    Psoriasis    Schatzki's ring     Social History   Socioeconomic History   Marital status: Married    Spouse name: Not on file   Number of children: 3   Years of education: Not on file   Highest education level: 12th grade  Occupational History   Occupation: PC ADMINISTRATOR    Employer: [employer]  Tobacco Use   Smoking status: Never    Passive exposure: Past   Smokeless tobacco: Never  Vaping Use   Vaping status: Never Used  Substance and Sexual Activity   Alcohol use: Yes    Alcohol/week: 1.0 standard drink of alcohol    Types: 1 Standard drinks or equivalent per week    Comment: occasional (DWI 1991)   Drug use: No   Sexual activity: Yes    Birth control/protection: None  Other Topics Concern   Not on file  Social History Narrative    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 With wife; and 2 dog.     Social Drivers of Corporate investment banker Strain: Low Risk  (09/04/2023)   Overall Financial Resource Strain (CARDIA)    Difficulty of Paying Living Expenses: Not hard at all  Food Insecurity: No Food Insecurity (09/04/2023)   Hunger Vital Sign    Worried About Running Out of Food in the Last Year: Never true    Ran Out of Food in the Last Year: Never true  Transportation Needs: No Transportation Needs (09/04/2023)   PRAPARE - Administrator, Civil Service (Medical): No    Lack of Transportation (Non-Medical): No  Physical Activity: Unknown (09/04/2023)   Exercise Vital Sign    Days of Exercise per Week: 0 days    Minutes of Exercise per Session: Not on file  Stress: No Stress Concern Present (09/04/2023)   [employer] of Occupational Health - Occupational Stress Questionnaire    Feeling of Stress : Only a little  Social Connections: Moderately Integrated (09/04/2023)   Social  Connection and Isolation Panel [NHANES]    Frequency of Communication with Friends and Family: Twice a week    Frequency of Social Gatherings with Friends and Family: Once a week    Attends Religious Services: 1 to 4 times per year    Active Member of [employer] or Organizations: No    Attends Banker Meetings: Not on file    Marital Status: Married  Catering manager Violence: Not At Risk (05/06/2023)   Humiliation, Afraid, Rape, and Kick questionnaire    Fear of Current or Ex-Partner: No    Emotionally Abused: No    Physically Abused: No    Sexually Abused: No    Past Surgical History:  Procedure Laterality Date   AMPUTATION TOE Left 05/08/2023   Procedure: AMPUTATION 5TH TOE WITH PARTIAL RAY RESECTION;  Surgeon: Linus Galas, DPM;  Location: [facility];  Service: Orthopedics/Podiatry;  Laterality: Left;   CARPAL TUNNEL RELEASE Left 08/24/2022   Procedure: CARPAL TUNNEL RELEASE;  Surgeon: Deeann Saint, MD;  Location: [facility];  Service: Orthopedics;  Laterality: Left;   CATARACT EXTRACTION W/PHACO Left 03/21/2023   Procedure: CATARACT EXTRACTION PHACO AND INTRAOCULAR LENS PLACEMENT (IOC) LEFT MALYUGIN DIABETIC 9.88 1:01.4;  Surgeon: Lockie Mola, MD;  Location: [facility];  Service: Ophthalmology;  Laterality: Left;   CATARACT EXTRACTION W/PHACO Right 04/04/2023   Procedure: CATARACT EXTRACTION PHACO AND INTRAOCULAR LENS PLACEMENT (IOC) RIGHT MALYUGIN DIABETIC 15.31 01:15.1;  Surgeon: Lockie Mola, MD;  Location: [facility];  Service: Ophthalmology;  Laterality: Right;   COLONOSCOPY     FOOT AMPUTATION Right 01/11/2017   Dr Deborah Chalk ([facility])--transmetatarsal   FOOT SURGERY Right 02/28/2013   Arta Bruce HAM TOE2,3 PINS ,2ND MET OSTEOTOMY, 5TH MET W/SCREW   [phone], 04-2013   HIP FRACTURE SURGERY  1993   surgery x2   IR BONE MARROW BIOPSY & ASPIRATION  04/27/2023   LOWER EXTREMITY ANGIOGRAPHY Left 05/07/2023   Procedure: Lower Extremity Angiography;   Surgeon: Annice Needy, MD;  Location: [facility];  Service: Cardiovascular;  Laterality: Left;   NASAL SEPTOPLASTY W/ TURBINOPLASTY Bilateral 02/08/2023   Procedure: NASAL SEPTOPLASTY WITH TURBINATE REDUCTION;  Surgeon: Vernie Murders, MD;  Location: [facility];  Service: ENT;  Laterality: Bilateral;   POLYPECTOMY     PROSTATE BIOPSY  10/10/2019   TOTAL HIP ARTHROPLASTY  Left hip replacement/femur fx 07/04, Screw removal left hip- [facility] 11/01    Family History  Problem Relation Age of Onset   Diabetes Mother    Diabetes Brother    Alzheimer's disease Maternal Aunt    Alzheimer's disease Cousin    Cancer Neg Hx    Colon cancer Neg Hx    Colon polyps Neg Hx    Rectal cancer Neg Hx    Stomach cancer Neg Hx    Esophageal cancer Neg Hx     Allergies  Allergen Reactions   Glipizide Other (See Comments)    REACTION: wt. gain, hands tingling   Lisinopril Other (See Comments)    HYPERKALEMIA   Ramipril Cough   Ibuprofen     UNSPECIFIED REACTION    Bactrim [Sulfamethoxazole-Trimethoprim] Nausea And Vomiting and Rash       Latest Ref Rng & Units 06/06/2023    8:31 AM 05/10/2023    6:06 AM 05/09/2023    4:45 AM  CBC  WBC 3.4 - 10.8 x10E3/uL 6.3  4.2  4.9   Hemoglobin 13.0 - 17.7 g/dL 16.1  8.9  8.9   Hematocrit 37.5 - 51.0 % 33.8  27.0  26.4   Platelets 150 - 450 x10E3/uL 327  259  275       CMP     Component Value Date/Time   NA 139 06/06/2023 0831   K 5.2 06/06/2023 0831   CL 100 06/06/2023 0831   CO2 23 06/06/2023 0831   GLUCOSE 144 (H) 06/06/2023 0831   GLUCOSE 189 (H) 05/10/2023 0606   BUN 19 06/06/2023 0831   CREATININE 0.96 06/06/2023 0831   CREATININE 1.23 01/03/2013 1212   CALCIUM 10.7 (H) 06/06/2023 0831   PROT 6.3 06/06/2023 0831   ALBUMIN 4.2 06/06/2023 0831   AST 20 06/06/2023 0831   ALT 18 06/06/2023 0831   ALKPHOS 91 06/06/2023 0831   BILITOT 0.3 06/06/2023 0831   EGFR 87 06/06/2023 0831   GFRNONAA >60 05/10/2023 0606    GFRNONAA >60 01/03/2013 1212     VAS [place] ABI WITH/WO TBI Result Date: 07/12/2023  LOWER EXTREMITY DOPPLER STUDY Patient Name:  SAVAUGHN KARWOWSKI Bramlett  Date of Exam:   07/10/2023 Medical Rec #: [id number]      Accession #:    [id number] Date of Birth: 1955/09/25      Patient Gender: M Patient Age:   10 years Exam Location:  [facility] Procedure:      VAS [place] ABI WITH/WO TBI Referring Phys: Barbara Cower DEW --------------------------------------------------------------------------------  Indications: Peripheral artery disease. High Risk Factors: Diabetes.  Vascular Interventions: 05/07/2023 Percutaneous transluminal angioplasty of left                         anterior tibial artery with 3 mm conventional and 4 mm                         Lutonix drug coated angioplasty balloon. Comparison Study: none Performing Technologist: Salvadore Farber RVT  Examination Guidelines: A complete evaluation includes at minimum, Doppler waveform signals and systolic blood pressure reading at the level of bilateral brachial, anterior tibial, and posterior tibial arteries, when vessel segments are accessible. Bilateral testing is considered an integral part of a complete examination. Photoelectric Plethysmograph (PPG) waveforms and toe systolic pressure readings are included as required and additional duplex testing as needed. Limited examinations for reoccurring indications may be performed as noted.  ABI Findings: +---------+------------------+-----+---------+--------+ Right    Rt Pressure (mmHg)IndexWaveform Comment  +---------+------------------+-----+---------+--------+ Brachial 156                                      +---------+------------------+-----+---------+--------+ ATA      166               1.04 triphasic         +---------+------------------+-----+---------+--------+ PTA      169               1.06 triphasic         +---------+------------------+-----+---------+--------+ Great Toe                                 amp      +---------+------------------+-----+---------+--------+ +---------+------------------+-----+---------+-------+ Left     Lt Pressure (mmHg)IndexWaveform Comment +---------+------------------+-----+---------+-------+ Brachial 160                                     +---------+------------------+-----+---------+-------+ ATA      166               1.04 triphasic        +---------+------------------+-----+---------+-------+ PTA      172               1.08 triphasic        +---------+------------------+-----+---------+-------+ Great Toe145               0.91 Normal           +---------+------------------+-----+---------+-------+ +-------+-----------+-----------+------------+------------+ ABI/TBIToday's ABIToday's TBIPrevious ABIPrevious TBI +-------+-----------+-----------+------------+------------+ Right  1.06       amp                                 +-------+-----------+-----------+------------+------------+ Left   1.08       .91                                 +-------+-----------+-----------+------------+------------+  Summary: Right: Resting right ankle-brachial index is within normal range. Total digit amputation. Left: Resting left ankle-brachial index is within normal range. The left toe-brachial index is normal. *See table(s) above for measurements and observations.  Electronically signed by Festus Barren MD on 07/12/2023 at 7:23:12 AM.    Final        Assessment & Plan:   1. Cold hands (Primary) Noninvasive study showed no large vessel occlusion.  He has triphasic waveforms due to the radial and ulnar vessels bilaterally.  The only abnormality is in the first and fifth fingers in the right hand, which I suspect to be related to small vessel disease.  This typically would not be amenable to intervention.  He is medically optimized from a vascular standpoint.  I suspect that given his symptoms, this is more so neuropathic in nature.  2.  Peripheral arterial disease with history of revascularization (HCC) Continues to show improvement with ulceration.  Will have follow-up evaluation postintervention in March  3. Essential hypertension, benign Continue antihypertensive medications as already ordered, these medications have been reviewed and there are no changes at this time.  4. Controlled type 2 diabetes mellitus with diabetic neuropathy, with long-term current  use of insulin (HCC) Continue hypoglycemic medications as already ordered, these medications have been reviewed and there are no changes at this time.  Hgb A1C to be monitored as already arranged by primary service   Current Outpatient Medications on File Prior to Visit  Medication Sig Dispense Refill   Alcohol Swabs (ALCOHOL PREP) 70 % PADS USE 1 PREP PAD TWICE DAILY 200 each 3   Alpha-Lipoic Acid 600 MG TABS Take by mouth. 2 in am and 1 in pm     amLODipine (NORVASC) 5 MG tablet TAKE 1 TABLET(5 MG) BY MOUTH DAILY 90 tablet 2   B-D UF III MINI PEN NEEDLES 31G X 5 MM MISC USE AS DIRECTED DAILY 90 each 3   calcipotriene (DOVONOX) 0.005 % ointment Apply topically 2 (two) times daily.     CALCIUM-MAGNESIUM-ZINC PO Take by mouth.     Cinnamon 500 MG capsule Take 500 mg by mouth 2 (two) times daily.     clobetasol ointment (TEMOVATE) 0.05 % Apply topically.     CONTOUR NEXT TEST test strip USE 1 TO 2 TIMES DAILY TO CHECK  BLOOD SUGAR 200 strip 3   cyanocobalamin (VITAMIN B12) 1000 MCG/ML injection 1 ml injection-once a month. 12 mL 1   DULoxetine (CYMBALTA) 30 MG capsule Take 1 capsule (30 mg total) by mouth daily. 90 capsule 3   DULoxetine (CYMBALTA) 60 MG capsule TAKE 1 CAPSULE BY MOUTH DAILY 90 capsule 3   feeding supplement (ENSURE ENLIVE / ENSURE PLUS) LIQD Take 237 mLs by mouth at bedtime. 237 mL 2   finasteride (PROSCAR) 5 MG tablet TAKE 1 TABLET BY MOUTH DAILY 90 tablet 3   furosemide (LASIX) 20 MG tablet Take 1 tablet (20 mg total) by mouth daily as needed. 90  tablet 3   gabapentin (NEURONTIN) 300 MG capsule Take 1 capsule (300 mg total) by mouth 3 (three) times daily. 270 capsule 3   HYDROcodone-acetaminophen (NORCO) 7.5-325 MG tablet Take 1 tablet by mouth every 6 (six) hours as needed.     hydrocortisone valerate ointment (WEST-CORT) 0.2 % APPLY TOPICALLY TO AFFECTED  AREA(S) TWICE DAILY 120 g 1   hydrOXYzine (VISTARIL) 25 MG capsule TAKE 1 CAPSULE(25 MG) BY MOUTH EVERY 8 HOURS AS NEEDED FOR ITCHING 60 capsule 3   Ixekizumab (TALTZ) 80 MG/ML SOAJ Inject 80 mg into the skin every 28 (twenty-eight) days. For maintenance. 1 mL 4   LANTUS SOLOSTAR 100 UNIT/ML Solostar Pen INJECT SUBCUTANEOUSLY 22 UNITS  DAILY 30 mL 3   losartan (COZAAR) 25 MG tablet TAKE 1 TABLET(25 MG) BY MOUTH DAILY 90 tablet 3   metFORMIN (GLUCOPHAGE) 1000 MG tablet TAKE 1 TABLET BY MOUTH TWICE  DAILY WITH MEALS 180 tablet 3   Multiple Vitamin (MULTIVITAMIN WITH MINERALS) TABS tablet Take 1 tablet by mouth daily. 30 tablet 2   omeprazole (PRILOSEC) 20 MG capsule TAKE 1 CAPSULE BY MOUTH TWICE  DAILY BEFORE MEALS (Patient taking differently: Take 20 mg by mouth daily.) 180 capsule 3   simvastatin (ZOCOR) 40 MG tablet TAKE 1 TABLET BY MOUTH AT  BEDTIME 90 tablet 3   SYRINGE-NEEDLE, DISP, 3 ML (BD SAFETYGLIDE SYRINGE/NEEDLE) 25G X 1" 3 ML MISC Use the needle for IM injection once a  month; or as directed. 30 each 1   tamsulosin (FLOMAX) 0.4 MG CAPS capsule TAKE 1 CAPSULE BY MOUTH DAILY 90 capsule 3   triamcinolone lotion (KENALOG) 0.1 % APPLY TO AFFECTED AREA(S)  TOPICALLY 3 TIMES DAILY 180 mL 1  No current facility-administered medications on file prior to visit.    There are no Patient Instructions on file for this visit. No follow-ups on file.   Georgiana Spinner, NP

## 2023-09-06 ENCOUNTER — Encounter: Payer: Self-pay | Admitting: Internal Medicine

## 2023-09-06 ENCOUNTER — Ambulatory Visit: Payer: 59 | Admitting: Internal Medicine

## 2023-09-06 VITALS — BP 130/68 | HR 96 | Temp 98.3°F | Ht 72.0 in | Wt 227.0 lb

## 2023-09-06 DIAGNOSIS — Z794 Long term (current) use of insulin: Secondary | ICD-10-CM | POA: Diagnosis not present

## 2023-09-06 DIAGNOSIS — E119 Type 2 diabetes mellitus without complications: Secondary | ICD-10-CM

## 2023-09-06 DIAGNOSIS — Z7984 Long term (current) use of oral hypoglycemic drugs: Secondary | ICD-10-CM | POA: Diagnosis not present

## 2023-09-06 DIAGNOSIS — E1140 Type 2 diabetes mellitus with diabetic neuropathy, unspecified: Secondary | ICD-10-CM | POA: Diagnosis not present

## 2023-09-06 DIAGNOSIS — D649 Anemia, unspecified: Secondary | ICD-10-CM

## 2023-09-06 DIAGNOSIS — Z89431 Acquired absence of right foot: Secondary | ICD-10-CM | POA: Diagnosis not present

## 2023-09-06 LAB — POCT GLYCOSYLATED HEMOGLOBIN (HGB A1C): Hemoglobin A1C: 9.4 % — AB (ref 4.0–5.6)

## 2023-09-06 MED ORDER — LANTUS SOLOSTAR 100 UNIT/ML ~~LOC~~ SOPN: 24.0000 [IU] | PEN_INJECTOR | Freq: Every day | SUBCUTANEOUS | 3 refills | Status: DC

## 2023-09-06 NOTE — Assessment & Plan Note (Signed)
Will recheck labs Going back to Dr B in April

## 2023-09-06 NOTE — Addendum Note (Signed)
Addended by: Eual Fines on: 09/06/2023 09:17 AM   Modules accepted: Orders

## 2023-09-06 NOTE — Assessment & Plan Note (Addendum)
Currently out of control--he is sure it is due to recent prednisone--- up to 9.4% Will increase lantus to 24 units daily Continue metformin 1000 bid Consider farxiga/jardiance  Increasing duloxetine to 90mg  daily--consider going to 120 On alpha lipoic acid and gabapentin---will also try TENS

## 2023-09-06 NOTE — Assessment & Plan Note (Signed)
Looks clean without infection

## 2023-09-06 NOTE — Progress Notes (Signed)
Subjective:    Patient ID: [name], male    DOB: 1955-11-10, 68 y.o.   MRN: [id number]  HPI Here for follow up of diabetes and other chronic health conditions  Had repair of right carpal tunnel yesterday--at [facility] Has fractures of left 3rd and 4th toes---might need more surgery. No ongoing infection that he knows of Awaiting the 30mg  duloxetine--hasn't gotten it yet  Knows his sugars are bad Has been on prednisone--from [facility] (head and chest congestion) Got it for a week Thinks this is the reason for his out of control diabetes Checks sugars morning and evening (150-205 in AM---over 200 or more in evening) Not keeping with diabetic eating as well  Drinking ensure per hospital--discussed switching to glucerna  No chest pain or SOB  Current Outpatient Medications on File Prior to Visit  Medication Sig Dispense Refill   Alcohol Swabs (ALCOHOL PREP) 70 % PADS USE 1 PREP PAD TWICE DAILY 200 each 3   Alpha-Lipoic Acid 600 MG TABS Take by mouth. 2 in am and 1 in pm     amLODipine (NORVASC) 5 MG tablet TAKE 1 TABLET(5 MG) BY MOUTH DAILY 90 tablet 2   B-D UF III MINI PEN NEEDLES 31G X 5 MM MISC USE AS DIRECTED DAILY 90 each 3   calcipotriene (DOVONOX) 0.005 % ointment Apply topically 2 (two) times daily.     CALCIUM-MAGNESIUM-ZINC PO Take by mouth.     Cinnamon 500 MG capsule Take 500 mg by mouth 2 (two) times daily.     clobetasol ointment (TEMOVATE) 0.05 % Apply topically.     CONTOUR NEXT TEST test strip USE 1 TO 2 TIMES DAILY TO CHECK  BLOOD SUGAR 200 strip 3   cyanocobalamin (VITAMIN B12) 1000 MCG/ML injection 1 ml injection-once a month. 12 mL 1   DULoxetine (CYMBALTA) 30 MG capsule Take 1 capsule (30 mg total) by mouth daily. 90 capsule 3   DULoxetine (CYMBALTA) 60 MG capsule TAKE 1 CAPSULE BY MOUTH DAILY 90 capsule 3   feeding supplement (ENSURE ENLIVE / ENSURE PLUS) LIQD Take 237 mLs by mouth at bedtime. 237 mL 2   finasteride (PROSCAR) 5 MG tablet TAKE 1 TABLET BY  MOUTH DAILY 90 tablet 3   furosemide (LASIX) 20 MG tablet Take 1 tablet (20 mg total) by mouth daily as needed. 90 tablet 3   gabapentin (NEURONTIN) 300 MG capsule Take 1 capsule (300 mg total) by mouth 3 (three) times daily. 270 capsule 3   HYDROcodone-acetaminophen (NORCO) 7.5-325 MG tablet Take 1 tablet by mouth every 6 (six) hours as needed.     hydrocortisone valerate ointment (WEST-CORT) 0.2 % APPLY TOPICALLY TO AFFECTED  AREA(S) TWICE DAILY 120 g 1   hydrOXYzine (VISTARIL) 25 MG capsule TAKE 1 CAPSULE(25 MG) BY MOUTH EVERY 8 HOURS AS NEEDED FOR ITCHING 60 capsule 3   Ixekizumab (TALTZ) 80 MG/ML SOAJ Inject 80 mg into the skin every 28 (twenty-eight) days. For maintenance. 1 mL 4   LANTUS SOLOSTAR 100 UNIT/ML Solostar Pen INJECT SUBCUTANEOUSLY 22 UNITS  DAILY 30 mL 3   losartan (COZAAR) 25 MG tablet TAKE 1 TABLET(25 MG) BY MOUTH DAILY 90 tablet 3   metFORMIN (GLUCOPHAGE) 1000 MG tablet TAKE 1 TABLET BY MOUTH TWICE  DAILY WITH MEALS 180 tablet 3   Multiple Vitamin (MULTIVITAMIN WITH MINERALS) TABS tablet Take 1 tablet by mouth daily. 30 tablet 2   omeprazole (PRILOSEC) 20 MG capsule TAKE 1 CAPSULE BY MOUTH TWICE  DAILY BEFORE MEALS (  Patient taking differently: Take 20 mg by mouth daily.) 180 capsule 3   simvastatin (ZOCOR) 40 MG tablet TAKE 1 TABLET BY MOUTH AT  BEDTIME 90 tablet 3   SYRINGE-NEEDLE, DISP, 3 ML (BD SAFETYGLIDE SYRINGE/NEEDLE) 25G X 1" 3 ML MISC Use the needle for IM injection once a  month; or as directed. 30 each 1   tamsulosin (FLOMAX) 0.4 MG CAPS capsule TAKE 1 CAPSULE BY MOUTH DAILY 90 capsule 3   triamcinolone lotion (KENALOG) 0.1 % APPLY TO AFFECTED AREA(S)  TOPICALLY 3 TIMES DAILY 180 mL 1   No current facility-administered medications on file prior to visit.    Allergies  Allergen Reactions   Glipizide Other (See Comments)    REACTION: wt. gain, hands tingling   Lisinopril Other (See Comments)    HYPERKALEMIA   Ramipril Cough   Ibuprofen     UNSPECIFIED  REACTION    Bactrim [Sulfamethoxazole-Trimethoprim] Nausea And Vomiting and Rash    Past Medical History:  Diagnosis Date   Adenomatous polyp    Allergy    Anemia    Arthritis    feet    Bunion 04/02/2013   STATUS POST OP BUN REPAIR   Diabetes mellitus    Foot ulcer (HCC)    GERD (gastroesophageal reflux disease)    Gout    Hammertoe    Hyperlipidemia    Hypertension    Neuromuscular disorder (HCC)    neuropathy   Plantar flexed metatarsal    Psoriasis    Schatzki's ring     Past Surgical History:  Procedure Laterality Date   AMPUTATION TOE Left 05/08/2023   Procedure: AMPUTATION 5TH TOE WITH PARTIAL RAY RESECTION;  Surgeon: Linus Galas, DPM;  Location: [facility] OR[facility];  Service: Orthopedics/Podiatry;  Laterality: Left;   CARPAL TUNNEL RELEASE Left 08/24/2022   Procedure: CARPAL TUNNEL RELEASE;  Surgeon: Deeann Saint, MD;  Location: [facility];  Service: Orthopedics;  Laterality: Left;   CATARACT EXTRACTION W/PHACO Left 03/21/2023   Procedure: CATARACT EXTRACTION PHACO AND INTRAOCULAR LENS PLACEMENT (IOC) LEFT MALYUGIN DIABETIC 9.88 1:01.4;  Surgeon: Lockie Mola, MD;  Location: [facility];  Service: Ophthalmology;  Laterality: Left;   CATARACT EXTRACTION W/PHACO Right 04/04/2023   Procedure: CATARACT EXTRACTION PHACO AND INTRAOCULAR LENS PLACEMENT (IOC) RIGHT MALYUGIN DIABETIC 15.31 01:15.1;  Surgeon: Lockie Mola, MD;  Location: [facility];  Service: Ophthalmology;  Laterality: Right;   COLONOSCOPY     FOOT AMPUTATION Right 01/11/2017   Dr Deborah Chalk ([facility])--transmetatarsal   FOOT SURGERY Right 02/28/2013   Arta Bruce HAM TOE2,3 PINS ,2ND MET OSTEOTOMY, 5TH MET W/SCREW   [phone], 04-2013   HIP FRACTURE SURGERY  1993   surgery x2   IR BONE MARROW BIOPSY & ASPIRATION  04/27/2023   LOWER EXTREMITY ANGIOGRAPHY Left 05/07/2023   Procedure: Lower Extremity Angiography;  Surgeon: Annice Needy, MD;  Location: [facility];  Service: Cardiovascular;   Laterality: Left;   NASAL SEPTOPLASTY W/ TURBINOPLASTY Bilateral 02/08/2023   Procedure: NASAL SEPTOPLASTY WITH TURBINATE REDUCTION;  Surgeon: Vernie Murders, MD;  Location: [facility];  Service: ENT;  Laterality: Bilateral;   POLYPECTOMY     PROSTATE BIOPSY  10/10/2019   TOTAL HIP ARTHROPLASTY     Left hip replacement/femur fx 07/04, Screw removal left hip- [facility] 11/01    Family History  Problem Relation Age of Onset   Diabetes Mother    Diabetes Brother    Alzheimer's disease Maternal Aunt    Alzheimer's disease Cousin    Cancer  Neg Hx    Colon cancer Neg Hx    Colon polyps Neg Hx    Rectal cancer Neg Hx    Stomach cancer Neg Hx    Esophageal cancer Neg Hx     Social History   Socioeconomic History   Marital status: Married    Spouse name: Not on file   Number of children: 3   Years of education: Not on file   Highest education level: 12th grade  Occupational History   Occupation: PC ADMINISTRATOR    Employer: [employer]  Tobacco Use   Smoking status: Never    Passive exposure: Past   Smokeless tobacco: Never  Vaping Use   Vaping status: Never Used  Substance and Sexual Activity   Alcohol use: Yes    Alcohol/week: 1.0 standard drink of alcohol    Types: 1 Standard drinks or equivalent per week    Comment: occasional (DWI 1991)   Drug use: No   Sexual activity: Yes    Birth control/protection: None  Other Topics Concern   Not on file  Social History Narrative    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 With wife; and 2 dog.     Social Drivers of Corporate investment banker Strain: Low Risk  (09/04/2023)   Overall Financial Resource Strain (CARDIA)    Difficulty of Paying Living Expenses: Not hard at all  Food Insecurity: No Food Insecurity (09/04/2023)   Hunger Vital Sign    Worried About Running Out of Food in the Last Year: Never true    Ran Out of Food in the Last Year: Never true  Transportation  Needs: No Transportation Needs (09/04/2023)   PRAPARE - Administrator, Civil Service (Medical): No    Lack of Transportation (Non-Medical): No  Physical Activity: Unknown (09/04/2023)   Exercise Vital Sign    Days of Exercise per Week: 0 days    Minutes of Exercise per Session: Not on file  Stress: No Stress Concern Present (09/04/2023)   [employer] of Occupational Health - Occupational Stress Questionnaire    Feeling of Stress : Only a little  Social Connections: Moderately Integrated (09/04/2023)   Social Connection and Isolation Panel [NHANES]    Frequency of Communication with Friends and Family: Twice a week    Frequency of Social Gatherings with Friends and Family: Once a week    Attends Religious Services: 1 to 4 times per year    Active Member of [employer] or Organizations: No    Attends Engineer, structural: Not on file    Marital Status: Married  Catering manager Violence: Not At Risk (05/06/2023)   Humiliation, Afraid, Rape, and Kick questionnaire    Fear of Current or Ex-Partner: No    Emotionally Abused: No    Physically Abused: No    Sexually Abused: No   Review of Systems Weight is up a little Not sleeping great Hasn't had to miss work--works from home a lot    Objective:   Physical Exam Constitutional:      Appearance: Normal appearance.  Cardiovascular:     Rate and Rhythm: Normal rate and regular rhythm.     Heart sounds: No murmur heard.    No gallop.  Pulmonary:     Effort: Pulmonary effort is normal.     Breath sounds: Normal breath sounds. No wheezing or rales.  Musculoskeletal:     Cervical back: Neck supple.  Right lower leg: No edema.     Left lower leg: No edema.     Comments: Right transmetatarsal amputation--slight plantar callous  Left 5th toe amputation. Some generalized edema but no open areas or inflammation  Lymphadenopathy:     Cervical: No cervical adenopathy.  Neurological:     Mental Status: He is alert.             Assessment & Plan:

## 2023-09-07 ENCOUNTER — Encounter: Payer: Self-pay | Admitting: Internal Medicine

## 2023-09-07 LAB — COMPREHENSIVE METABOLIC PANEL
ALT: 19 [IU]/L (ref 0–44)
AST: 18 [IU]/L (ref 0–40)
Albumin: 3.8 g/dL — ABNORMAL LOW (ref 3.9–4.9)
Alkaline Phosphatase: 91 [IU]/L (ref 44–121)
BUN/Creatinine Ratio: 12 (ref 10–24)
BUN: 13 mg/dL (ref 8–27)
Bilirubin Total: 0.3 mg/dL (ref 0.0–1.2)
CO2: 21 mmol/L (ref 20–29)
Calcium: 9.8 mg/dL (ref 8.6–10.2)
Chloride: 100 mmol/L (ref 96–106)
Creatinine, Ser: 1.09 mg/dL (ref 0.76–1.27)
Globulin, Total: 2.3 g/dL (ref 1.5–4.5)
Glucose: 231 mg/dL — ABNORMAL HIGH (ref 70–99)
Potassium: 5.2 mmol/L (ref 3.5–5.2)
Sodium: 137 mmol/L (ref 134–144)
Total Protein: 6.1 g/dL (ref 6.0–8.5)
eGFR: 74 mL/min/{1.73_m2} (ref >59)

## 2023-09-07 LAB — CBC
Hematocrit: 34.4 % — ABNORMAL LOW (ref 37.5–51.0)
Hemoglobin: 11.2 g/dL — ABNORMAL LOW (ref 13.0–17.7)
MCH: 28.8 pg (ref 26.6–33.0)
MCHC: 32.6 g/dL (ref 31.5–35.7)
MCV: 88 fL (ref 79–97)
Platelets: 314 10*3/uL (ref 150–450)
RBC: 3.89 x10E6/uL — ABNORMAL LOW (ref 4.14–5.80)
RDW: 12.8 % (ref 11.6–15.4)
WBC: 6.1 10*3/uL (ref 3.4–10.8)

## 2023-09-07 LAB — TSH: TSH: 3.19 u[IU]/mL (ref 0.450–4.500)

## 2023-09-27 ENCOUNTER — Ambulatory Visit: Payer: Self-pay | Admitting: Internal Medicine

## 2023-09-27 NOTE — Telephone Encounter (Signed)
 Chief Complaint: Sinus pain, body aches, nasal congestion, sore throat Symptoms: see above Frequency: A few days Pertinent Negatives: Patient denies fever Disposition: [] ED /[] Urgent Care (no appt availability in office) / [x] Appointment(In office/virtual)/ []  [place] [facility]/ [] Home Care/ [] Refused Recommended Disposition /[]  Mobile Bus/ []  Follow-up with PCP Additional Notes: Patient called in stating he would like Dr. Alphonsus Sias to call him in Zpack for his symptoms. This RN suggested virtual consultation with Dr. Alphonsus Sias to discuss symptoms, as patient is experiencing body aches, nasal congestion/runny nose, throat discomfort from drainage, mild cough, and sinus pressure in face. Patient states he usually takes Z Pack for these sinus infections. Patient appt made for tomorrow.   Reason for Disposition  [1] Sinus pain (not just congestion) AND [2] fever    No known fever but patient states he experiences sinus infections often that requires antibiotics and Dr. Alphonsus Sias is aware  Answer Assessment - Initial Assessment Questions 1. LOCATION: "Where does it hurt?"      Around eyes 2. ONSET: "When did the sinus pain start?"  (e.g., hours, days)      A couple days ago 3. SEVERITY: "How bad is the pain?"   (Scale 1-10; mild, moderate or severe)   - MILD (1-3): doesn't interfere with normal activities    - MODERATE (4-7): interferes with normal activities (e.g., work or school) or awakens from sleep   - SEVERE (8-10): excruciating pain and patient unable to do any normal activities        5 4. RECURRENT SYMPTOM: "Have you ever had sinus problems before?" If Yes, ask: "When was the last time?" and "What happened that time?"      Yes 5. NASAL CONGESTION: "Is the nose blocked?" If Yes, ask: "Can you open it or must you breathe through your mouth?"     Yes 6. NASAL DISCHARGE: "Do you have discharge from your nose?" If so ask, "What color?"     Green 7. FEVER: "Do you have a fever?"  If Yes, ask: "What is it, how was it measured, and when did it start?"      No 8. OTHER SYMPTOMS: "Do you have any other symptoms?" (e.g., sore throat, cough, earache, difficulty breathing)     Throat irritation, body aches, nasal congestion, runny nose, cough  Protocols used: Sinus Pain or Congestion-A-AH

## 2023-09-28 ENCOUNTER — Encounter: Payer: Self-pay | Admitting: Internal Medicine

## 2023-09-28 ENCOUNTER — Telehealth: Admitting: Internal Medicine

## 2023-09-28 VITALS — BP 123/70 | Ht 72.0 in | Wt 219.0 lb

## 2023-09-28 DIAGNOSIS — J0110 Acute frontal sinusitis, unspecified: Secondary | ICD-10-CM | POA: Insufficient documentation

## 2023-09-28 MED ORDER — AMOXICILLIN-POT CLAVULANATE 875-125 MG PO TABS: 1.0000 | ORAL_TABLET | Freq: Two times a day (BID) | ORAL | 0 refills | Status: DC

## 2023-09-28 NOTE — Progress Notes (Signed)
 Subjective:    Patient ID: [name], male    DOB: Apr 03, 1956, 68 y.o.   MRN: [id number]  HPI Virtual visit due to respiratory infection Identification done Reviewed limitations and billing and he gave consent Participants--patient in his home and I am in my office  Started a few days ago Has had worsening head congestion---then post nasal drip and cough Frontal pressure  Worse at night--affecting his sleep No fever Chills and sweats the first night No SOB---unless he overdoes it No ear pain---just ringing Some sore throat this morning--relates to the drainage  Using cough drops and tylenol  Current Outpatient Medications on File Prior to Visit  Medication Sig Dispense Refill   Alcohol Swabs (ALCOHOL PREP) 70 % PADS USE 1 PREP PAD TWICE DAILY 200 each 3   Alpha-Lipoic Acid 600 MG TABS Take by mouth. 2 in am and 1 in pm     amLODipine (NORVASC) 5 MG tablet TAKE 1 TABLET(5 MG) BY MOUTH DAILY 90 tablet 2   B-D UF III MINI PEN NEEDLES 31G X 5 MM MISC USE AS DIRECTED DAILY 90 each 3   calcipotriene (DOVONOX) 0.005 % ointment Apply topically 2 (two) times daily.     CALCIUM-MAGNESIUM-ZINC PO Take by mouth.     Cinnamon 500 MG capsule Take 500 mg by mouth 2 (two) times daily.     clobetasol ointment (TEMOVATE) 0.05 % Apply topically.     CONTOUR NEXT TEST test strip USE 1 TO 2 TIMES DAILY TO CHECK  BLOOD SUGAR 200 strip 3   cyanocobalamin (VITAMIN B12) 1000 MCG/ML injection 1 ml injection-once a month. 12 mL 1   DULoxetine (CYMBALTA) 30 MG capsule Take 1 capsule (30 mg total) by mouth daily. 90 capsule 3   DULoxetine (CYMBALTA) 60 MG capsule TAKE 1 CAPSULE BY MOUTH DAILY 90 capsule 3   feeding supplement (ENSURE ENLIVE / ENSURE PLUS) LIQD Take 237 mLs by mouth at bedtime. 237 mL 2   finasteride (PROSCAR) 5 MG tablet TAKE 1 TABLET BY MOUTH DAILY 90 tablet 3   furosemide (LASIX) 20 MG tablet Take 1 tablet (20 mg total) by mouth daily as needed. 90 tablet 3   gabapentin (NEURONTIN)  300 MG capsule Take 1 capsule (300 mg total) by mouth 3 (three) times daily. 270 capsule 3   HYDROcodone-acetaminophen (NORCO) 7.5-325 MG tablet Take 1 tablet by mouth every 6 (six) hours as needed.     hydrocortisone valerate ointment (WEST-CORT) 0.2 % APPLY TOPICALLY TO AFFECTED  AREA(S) TWICE DAILY 120 g 1   hydrOXYzine (VISTARIL) 25 MG capsule TAKE 1 CAPSULE(25 MG) BY MOUTH EVERY 8 HOURS AS NEEDED FOR ITCHING 60 capsule 3   insulin glargine (LANTUS SOLOSTAR) 100 UNIT/ML Solostar Pen Inject 24 Units into the skin daily. 1 mL 3   Ixekizumab (TALTZ) 80 MG/ML SOAJ Inject 80 mg into the skin every 28 (twenty-eight) days. For maintenance. 1 mL 4   losartan (COZAAR) 25 MG tablet TAKE 1 TABLET(25 MG) BY MOUTH DAILY 90 tablet 3   metFORMIN (GLUCOPHAGE) 1000 MG tablet TAKE 1 TABLET BY MOUTH TWICE  DAILY WITH MEALS 180 tablet 3   Multiple Vitamin (MULTIVITAMIN WITH MINERALS) TABS tablet Take 1 tablet by mouth daily. 30 tablet 2   omeprazole (PRILOSEC) 20 MG capsule TAKE 1 CAPSULE BY MOUTH TWICE  DAILY BEFORE MEALS (Patient taking differently: Take 20 mg by mouth daily.) 180 capsule 3   simvastatin (ZOCOR) 40 MG tablet TAKE 1 TABLET BY MOUTH AT  BEDTIME  90 tablet 3   SYRINGE-NEEDLE, DISP, 3 ML (BD SAFETYGLIDE SYRINGE/NEEDLE) 25G X 1" 3 ML MISC Use the needle for IM injection once a  month; or as directed. 30 each 1   tamsulosin (FLOMAX) 0.4 MG CAPS capsule TAKE 1 CAPSULE BY MOUTH DAILY 90 capsule 3   triamcinolone lotion (KENALOG) 0.1 % APPLY TO AFFECTED AREA(S)  TOPICALLY 3 TIMES DAILY 180 mL 1   No current facility-administered medications on file prior to visit.    Allergies  Allergen Reactions   Glipizide Other (See Comments)    REACTION: wt. gain, hands tingling   Lisinopril Other (See Comments)    HYPERKALEMIA   Ramipril Cough   Ibuprofen     UNSPECIFIED REACTION    Bactrim [Sulfamethoxazole-Trimethoprim] Nausea And Vomiting and Rash    Past Medical History:  Diagnosis Date    Adenomatous polyp    Allergy    Anemia    Arthritis    feet    Bunion 04/02/2013   STATUS POST OP BUN REPAIR   Diabetes mellitus    Foot ulcer (HCC)    GERD (gastroesophageal reflux disease)    Gout    Hammertoe    Hyperlipidemia    Hypertension    Neuromuscular disorder (HCC)    neuropathy   Plantar flexed metatarsal    Psoriasis    Schatzki's ring     Past Surgical History:  Procedure Laterality Date   AMPUTATION TOE Left 05/08/2023   Procedure: AMPUTATION 5TH TOE WITH PARTIAL RAY RESECTION;  Surgeon: Linus Galas, DPM;  Location: [facility];  Service: Orthopedics/Podiatry;  Laterality: Left;   CARPAL TUNNEL RELEASE Left 08/24/2022   Procedure: CARPAL TUNNEL RELEASE;  Surgeon: Deeann Saint, MD;  Location: [facility];  Service: Orthopedics;  Laterality: Left;   CATARACT EXTRACTION W/PHACO Left 03/21/2023   Procedure: CATARACT EXTRACTION PHACO AND INTRAOCULAR LENS PLACEMENT (IOC) LEFT MALYUGIN DIABETIC 9.88 1:01.4;  Surgeon: Lockie Mola, MD;  Location: [facility];  Service: Ophthalmology;  Laterality: Left;   CATARACT EXTRACTION W/PHACO Right 04/04/2023   Procedure: CATARACT EXTRACTION PHACO AND INTRAOCULAR LENS PLACEMENT (IOC) RIGHT MALYUGIN DIABETIC 15.31 01:15.1;  Surgeon: Lockie Mola, MD;  Location: [facility];  Service: Ophthalmology;  Laterality: Right;   COLONOSCOPY     FOOT AMPUTATION Right 01/11/2017   Dr Deborah Chalk ([facility])--transmetatarsal   FOOT SURGERY Right 02/28/2013   Arta Bruce HAM TOE2,3 PINS ,2ND MET OSTEOTOMY, 5TH MET W/SCREW   [phone], 04-2013   HIP FRACTURE SURGERY  1993   surgery x2   IR BONE MARROW BIOPSY & ASPIRATION  04/27/2023   LOWER EXTREMITY ANGIOGRAPHY Left 05/07/2023   Procedure: Lower Extremity Angiography;  Surgeon: Annice Needy, MD;  Location: [facility];  Service: Cardiovascular;  Laterality: Left;   NASAL SEPTOPLASTY W/ TURBINOPLASTY Bilateral 02/08/2023   Procedure: NASAL SEPTOPLASTY WITH TURBINATE REDUCTION;   Surgeon: Vernie Murders, MD;  Location: [facility];  Service: ENT;  Laterality: Bilateral;   POLYPECTOMY     PROSTATE BIOPSY  10/10/2019   TOTAL HIP ARTHROPLASTY     Left hip replacement/femur fx 07/04, Screw removal left hip- [facility] 11/01    Family History  Problem Relation Age of Onset   Diabetes Mother    Diabetes Brother    Alzheimer's disease Maternal Aunt    Alzheimer's disease Cousin    Cancer Neg Hx    Colon cancer Neg Hx    Colon polyps Neg Hx    Rectal cancer Neg Hx    Stomach  cancer Neg Hx    Esophageal cancer Neg Hx     Social History   Socioeconomic History   Marital status: Married    Spouse name: Not on file   Number of children: 3   Years of education: Not on file   Highest education level: 12th grade  Occupational History   Occupation: PC ADMINISTRATOR    Employer: [employer]  Tobacco Use   Smoking status: Never    Passive exposure: Past   Smokeless tobacco: Never  Vaping Use   Vaping status: Never Used  Substance and Sexual Activity   Alcohol use: Yes    Alcohol/week: 1.0 standard drink of alcohol    Types: 1 Standard drinks or equivalent per week    Comment: occasional (DWI 1991)   Drug use: No   Sexual activity: Yes    Birth control/protection: None  Other Topics Concern   Not on file  Social History Narrative    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 With wife; and 2 dog.     Social Drivers of Corporate investment banker Strain: Low Risk  (09/04/2023)   Overall Financial Resource Strain (CARDIA)    Difficulty of Paying Living Expenses: Not hard at all  Food Insecurity: No Food Insecurity (09/04/2023)   Hunger Vital Sign    Worried About Running Out of Food in the Last Year: Never true    Ran Out of Food in the Last Year: Never true  Transportation Needs: No Transportation Needs (09/04/2023)   PRAPARE - Administrator, Civil Service (Medical): No    Lack of Transportation  (Non-Medical): No  Physical Activity: Unknown (09/04/2023)   Exercise Vital Sign    Days of Exercise per Week: 0 days    Minutes of Exercise per Session: Not on file  Stress: No Stress Concern Present (09/04/2023)   [employer] of Occupational Health - Occupational Stress Questionnaire    Feeling of Stress : Only a little  Social Connections: Moderately Integrated (09/04/2023)   Social Connection and Isolation Panel [NHANES]    Frequency of Communication with Friends and Family: Twice a week    Frequency of Social Gatherings with Friends and Family: Once a week    Attends Religious Services: 1 to 4 times per year    Active Member of [employer] or Organizations: No    Attends Engineer, structural: Not on file    Marital Status: Married  Catering manager Violence: Not At Risk (05/06/2023)   Humiliation, Afraid, Rape, and Kick questionnaire    Fear of Current or Ex-Partner: No    Emotionally Abused: No    Physically Abused: No    Sexually Abused: No   Review of Systems No loss of smell or taste No N/V Some loose stools Appetite is off     Objective:   Physical Exam Constitutional:      Appearance: Normal appearance.  Pulmonary:     Effort: Pulmonary effort is normal. No respiratory distress.  Neurological:     Mental Status: He is alert.            Assessment & Plan:

## 2023-09-28 NOTE — Telephone Encounter (Signed)
 If symptoms just started, might want to delay antibiotics-----and z-pak not the best for sinuses Will review at the OV

## 2023-09-28 NOTE — Telephone Encounter (Signed)
 Appointment was done. No issues

## 2023-09-28 NOTE — Assessment & Plan Note (Signed)
 Discussed that this may still be viral Continue tylenol If worsens, will start augmentin 875 bid x 7 days

## 2023-10-09 ENCOUNTER — Encounter (INDEPENDENT_AMBULATORY_CARE_PROVIDER_SITE_OTHER): Payer: 59

## 2023-10-09 ENCOUNTER — Ambulatory Visit (INDEPENDENT_AMBULATORY_CARE_PROVIDER_SITE_OTHER): Payer: 59 | Admitting: Vascular Surgery

## 2023-10-10 ENCOUNTER — Encounter: Payer: Self-pay | Admitting: Internal Medicine

## 2023-10-15 ENCOUNTER — Inpatient Hospital Stay: Payer: 59 | Attending: Internal Medicine | Admitting: Internal Medicine

## 2023-10-15 ENCOUNTER — Encounter: Payer: Self-pay | Admitting: Internal Medicine

## 2023-10-15 VITALS — BP 118/66 | HR 89 | Temp 98.0°F | Resp 16 | Ht 72.0 in | Wt 230.7 lb

## 2023-10-15 DIAGNOSIS — Z860101 Personal history of adenomatous and serrated colon polyps: Secondary | ICD-10-CM | POA: Insufficient documentation

## 2023-10-15 DIAGNOSIS — I10 Essential (primary) hypertension: Secondary | ICD-10-CM | POA: Insufficient documentation

## 2023-10-15 DIAGNOSIS — L97519 Non-pressure chronic ulcer of other part of right foot with unspecified severity: Secondary | ICD-10-CM | POA: Insufficient documentation

## 2023-10-15 DIAGNOSIS — Z881 Allergy status to other antibiotic agents status: Secondary | ICD-10-CM | POA: Diagnosis not present

## 2023-10-15 DIAGNOSIS — Z79899 Other long term (current) drug therapy: Secondary | ICD-10-CM | POA: Diagnosis not present

## 2023-10-15 DIAGNOSIS — L97529 Non-pressure chronic ulcer of other part of left foot with unspecified severity: Secondary | ICD-10-CM | POA: Diagnosis not present

## 2023-10-15 DIAGNOSIS — Z888 Allergy status to other drugs, medicaments and biological substances status: Secondary | ICD-10-CM | POA: Insufficient documentation

## 2023-10-15 DIAGNOSIS — D649 Anemia, unspecified: Secondary | ICD-10-CM | POA: Diagnosis not present

## 2023-10-15 DIAGNOSIS — E785 Hyperlipidemia, unspecified: Secondary | ICD-10-CM | POA: Diagnosis not present

## 2023-10-15 DIAGNOSIS — E1140 Type 2 diabetes mellitus with diabetic neuropathy, unspecified: Secondary | ICD-10-CM | POA: Diagnosis not present

## 2023-10-15 DIAGNOSIS — Z833 Family history of diabetes mellitus: Secondary | ICD-10-CM | POA: Diagnosis not present

## 2023-10-15 DIAGNOSIS — Z886 Allergy status to analgesic agent status: Secondary | ICD-10-CM | POA: Diagnosis not present

## 2023-10-15 DIAGNOSIS — R5383 Other fatigue: Secondary | ICD-10-CM | POA: Insufficient documentation

## 2023-10-15 DIAGNOSIS — Z818 Family history of other mental and behavioral disorders: Secondary | ICD-10-CM | POA: Diagnosis not present

## 2023-10-15 NOTE — Assessment & Plan Note (Addendum)
#   Anemia- mild Hb 12; normocytic [since MAY 2023]-DEC 2023-  Iron studies; ferritin no evidence of iron deficiency; myeloma panel kappa lambda light chain; hepatitis screening; LDH haptoglobin; CRP-within normal limit.  Peripheral smear no evidence of schistocytes.  OCT 2024- Bone marrow Biopsy: Variably cellular bone marrow with trilineage hematopoiesis; The bone marrow is generally normocellular for age with trilineage  hematopoiesis.  There are mild nonspecific changes present, likely  secondary in nature.  Correlation with cytogenetic studies is  recommended. [place] 2024- OCT 2024-  Increased hepatic parenchymal echogenicity suggestive of steatosis.  No cholelithiasis or sonographic evidence for acute cholecystitis.  # Anemia currently 11 likely secondary to inflammation/infection [sec to toe infection]   # DM- Bil Foot ulcer- [Dr.Kline, podiatry];  s/p amputations- stable.   # B12 deficiency- 141 [March 2024-; [facility]]-Recommend STOPPING  monthly B 12 injections- ; MARCH 2025- -B 12 > 2000 ; recommend B12  1000 mcg every other day.   # Psoriasis- on TNF blocker-TALTZ [Dr.Kowalski]-stable/improved.  # Diabetic DM [A1c 5.9]- on insulin- see above.    # DISPOSITION: # follow up in 6  months- MD; [facility] 1 weeks prior-labs- cbc/cmp; b12 levels;iron studies;ferritin- CRP- Dr.B

## 2023-10-15 NOTE — Progress Notes (Signed)
 Fatigue/weakness: YES-FEET-WEAKNESS Dyspena: NO Light headedness: NO  Blood in stool: NO   Had a lt foot 5th toe amputation, now 3rd and 4th toes lt foot are fractured. Pain 2/10 when sitting and 5/10 while walking. Sees Dr. Alberteen Spindle at [facility].

## 2023-10-15 NOTE — Progress Notes (Signed)
 [facility] CONSULT NOTE  Patient Care Team: Karie Schwalbe, MD as PCP - General Donneta Romberg Worthy Flank, MD as Consulting Physician (Oncology)  CHIEF COMPLAINTS/PURPOSE OF CONSULTATION: ANEMIA   HEMATOLOGY HISTORY  # SEP 2023- ANEMIA MCV-platelets WBC-WNL; Iron sat; ferritin;  GFR- CT/US; EGD- in JAn 2024 /colonoscopy-dec 21st, 2023; prior 3-4 year [Dr.[facility]Baur [facility]- [facility]]- DEC 2023-  Iron studies; ferritin no evidence of iron deficiency; myeloma panel kappa lambda light chain; hepatitis screening; LDH haptoglobin; CRP-within normal limit.  Peripheral smear no evidence of schistocytes. APRIl 2024- B12 141. Started B12 injections; OCThe bone marrow is generally normocellular for age with trilineage  hematopoiesis.  There are mild nonspecific changes present, likely  secondary in nature.  Correlation with cytogenetic studies is  recommended.  #    Latest Reference Range & Units Most Recent 12/14/21 10:08 03/29/22 13:12 06/19/22 15:18 06/28/22 11:16  Hemoglobin 13.0 - 17.7 g/dL 57.8 (L) 46/9/62 95:28 13.4 12.2 (L) 11.9 (L) 12.0 (L)  (L): Data is abnormally low  Latest Reference Range & Units 06/28/22 11:16  Iron 38 - 169 ug/dL 98  UIBC 413 - 244 ug/dL 010  TIBC 272 - 536 ug/dL 644  Ferritin 30 - 034 ng/mL 143  Iron Saturation 15 - 55 % 31    # Psoraiss [Dx- 18 years]- 2023- started TNF   HISTORY OF PRESENTING ILLNESS: alone. In a boot   [name] 68 y.o.  male pleasant patient with mild anemia likley secondary chronic diase/infection- follow-up-] is here for a follow up-  Had a lt foot 5th toe amputation, now 3rd and 4th toes lt foot are fractured. Pain 2/10 when sitting and 5/10 while walking. Sees Dr. Alberteen Spindle at [facility]   Review of Systems  Constitutional:  Positive for malaise/fatigue. Negative for chills, diaphoresis, fever and weight loss.  HENT:  Negative for nosebleeds and sore throat.   Eyes:  Negative for double vision.  Respiratory:  Negative for cough,  hemoptysis, sputum production, shortness of breath and wheezing.   Cardiovascular:  Negative for chest pain, palpitations, orthopnea and leg swelling.  Gastrointestinal:  Negative for abdominal pain, blood in stool, constipation, diarrhea, heartburn, melena, nausea and vomiting.  Genitourinary:  Positive for frequency. Negative for dysuria and urgency.  Musculoskeletal:  Negative for back pain and joint pain.  Skin: Negative.  Negative for itching and rash.  Neurological:  Negative for dizziness, tingling, focal weakness, weakness and headaches.  Endo/Heme/Allergies:  Does not bruise/bleed easily.  Psychiatric/Behavioral:  Negative for depression. The patient is not nervous/anxious and does not have insomnia.      MEDICAL HISTORY:  Past Medical History:  Diagnosis Date   Adenomatous polyp    Allergy    Anemia    Arthritis    feet    Bunion 04/02/2013   STATUS POST OP BUN REPAIR   Diabetes mellitus    Foot ulcer (HCC)    GERD (gastroesophageal reflux disease)    Gout    Hammertoe    Hyperlipidemia    Hypertension    Neuromuscular disorder (HCC)    neuropathy   Plantar flexed metatarsal    Psoriasis    Schatzki's ring     SURGICAL HISTORY: Past Surgical History:  Procedure Laterality Date   AMPUTATION TOE Left 05/08/2023   Procedure: AMPUTATION 5TH TOE WITH PARTIAL RAY RESECTION;  Surgeon: Linus Galas, DPM;  Location: [facility] ORS;  Service: Orthopedics/Podiatry;  Laterality: Left;   CARPAL TUNNEL RELEASE Left 08/24/2022   Procedure: CARPAL TUNNEL  RELEASE;  Surgeon: Deeann Saint, MD;  Location: [facility] ORS;  Service: Orthopedics;  Laterality: Left;   CATARACT EXTRACTION W/PHACO Left 03/21/2023   Procedure: CATARACT EXTRACTION PHACO AND INTRAOCULAR LENS PLACEMENT (IOC) LEFT MALYUGIN DIABETIC 9.88 1:01.4;  Surgeon: Lockie Mola, MD;  Location: [facility];  Service: Ophthalmology;  Laterality: Left;   CATARACT EXTRACTION W/PHACO Right 04/04/2023   Procedure: CATARACT  EXTRACTION PHACO AND INTRAOCULAR LENS PLACEMENT (IOC) RIGHT MALYUGIN DIABETIC 15.31 01:15.1;  Surgeon: Lockie Mola, MD;  Location: [facility];  Service: Ophthalmology;  Laterality: Right;   COLONOSCOPY     FOOT AMPUTATION Right 01/11/2017   Dr Deborah Chalk ([facility])--transmetatarsal   FOOT SURGERY Right 02/28/2013   Arta Bruce HAM TOE2,3 PINS ,2ND MET OSTEOTOMY, 5TH MET W/SCREW   [phone], 04-2013   HIP FRACTURE SURGERY  1993   surgery x2   IR BONE MARROW BIOPSY & ASPIRATION  04/27/2023   LOWER EXTREMITY ANGIOGRAPHY Left 05/07/2023   Procedure: Lower Extremity Angiography;  Surgeon: Annice Needy, MD;  Location: [facility] INVASIVE CV LAB;  Service: Cardiovascular;  Laterality: Left;   NASAL SEPTOPLASTY W/ TURBINOPLASTY Bilateral 02/08/2023   Procedure: NASAL SEPTOPLASTY WITH TURBINATE REDUCTION;  Surgeon: Vernie Murders, MD;  Location: [facility];  Service: ENT;  Laterality: Bilateral;   POLYPECTOMY     PROSTATE BIOPSY  10/10/2019   TOTAL HIP ARTHROPLASTY     Left hip replacement/femur fx 07/04, Screw removal left hip- Hines 11/01    SOCIAL HISTORY: Social History   Socioeconomic History   Marital status: Married    Spouse name: Not on file   Number of children: 3   Years of education: Not on file   Highest education level: 12th grade  Occupational History   Occupation: PC ADMINISTRATOR    Employer: [employer]  Tobacco Use   Smoking status: Never    Passive exposure: Past   Smokeless tobacco: Never  Vaping Use   Vaping status: Never Used  Substance and Sexual Activity   Alcohol use: Yes    Alcohol/week: 1.0 standard drink of alcohol    Types: 1 Standard drinks or equivalent per week    Comment: occasional (DWI 1991)   Drug use: No   Sexual activity: Yes    Birth control/protection: None  Other Topics Concern   Not on file  Social History Narrative    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 With wife; and 2 dog.      Social Drivers of Corporate investment banker Strain: Low Risk  (09/04/2023)   Overall Financial Resource Strain (CARDIA)    Difficulty of Paying Living Expenses: Not hard at all  Food Insecurity: No Food Insecurity (09/04/2023)   Hunger Vital Sign    Worried About Running Out of Food in the Last Year: Never true    Ran Out of Food in the Last Year: Never true  Transportation Needs: No Transportation Needs (09/04/2023)   PRAPARE - Administrator, Civil Service (Medical): No    Lack of Transportation (Non-Medical): No  Physical Activity: Unknown (09/04/2023)   Exercise Vital Sign    Days of Exercise per Week: 0 days    Minutes of Exercise per Session: Not on file  Stress: No Stress Concern Present (09/04/2023)   [employer] of Occupational Health - Occupational Stress Questionnaire    Feeling of Stress : Only a little  Social Connections: Moderately Integrated (09/04/2023)   Social Connection and Isolation Panel [  NHANES]    Frequency of Communication with Friends and Family: Twice a week    Frequency of Social Gatherings with Friends and Family: Once a week    Attends Religious Services: 1 to 4 times per year    Active Member of [employer] or Organizations: No    Attends Engineer, structural: Not on file    Marital Status: Married  Catering manager Violence: Not At Risk (05/06/2023)   Humiliation, Afraid, Rape, and Kick questionnaire    Fear of Current or Ex-Partner: No    Emotionally Abused: No    Physically Abused: No    Sexually Abused: No    FAMILY HISTORY: Family History  Problem Relation Age of Onset   Diabetes Mother    Diabetes Brother    Alzheimer's disease Maternal Aunt    Alzheimer's disease Cousin    Cancer Neg Hx    Colon cancer Neg Hx    Colon polyps Neg Hx    Rectal cancer Neg Hx    Stomach cancer Neg Hx    Esophageal cancer Neg Hx     ALLERGIES:  is allergic to glipizide, lisinopril, ramipril, ibuprofen, and bactrim  [sulfamethoxazole-trimethoprim].  MEDICATIONS:  Current Outpatient Medications  Medication Sig Dispense Refill   Alcohol Swabs (ALCOHOL PREP) 70 % PADS USE 1 PREP PAD TWICE DAILY 200 each 3   Alpha-Lipoic Acid 600 MG TABS Take by mouth. 3 in am and 1 in pm     amLODipine (NORVASC) 5 MG tablet TAKE 1 TABLET(5 MG) BY MOUTH DAILY 90 tablet 2   B-D UF III MINI PEN NEEDLES 31G X 5 MM MISC USE AS DIRECTED DAILY 90 each 3   CALCIUM-MAGNESIUM-ZINC PO Take by mouth.     Cinnamon 500 MG capsule Take 500 mg by mouth 2 (two) times daily.     CONTOUR NEXT TEST test strip USE 1 TO 2 TIMES DAILY TO CHECK  BLOOD SUGAR 200 strip 3   cyanocobalamin (VITAMIN B12) 1000 MCG/ML injection 1 ml injection-once a month. 12 mL 1   DULoxetine (CYMBALTA) 30 MG capsule Take 1 capsule (30 mg total) by mouth daily. 90 capsule 3   DULoxetine (CYMBALTA) 60 MG capsule TAKE 1 CAPSULE BY MOUTH DAILY 90 capsule 3   finasteride (PROSCAR) 5 MG tablet TAKE 1 TABLET BY MOUTH DAILY 90 tablet 3   furosemide (LASIX) 20 MG tablet Take 1 tablet (20 mg total) by mouth daily as needed. 90 tablet 3   gabapentin (NEURONTIN) 300 MG capsule Take 1 capsule (300 mg total) by mouth 3 (three) times daily. (Patient taking differently: Take 600 mg by mouth 3 (three) times daily.) 270 capsule 3   hydrocortisone valerate ointment (WEST-CORT) 0.2 % APPLY TOPICALLY TO AFFECTED  AREA(S) TWICE DAILY 120 g 1   hydrOXYzine (VISTARIL) 25 MG capsule TAKE 1 CAPSULE(25 MG) BY MOUTH EVERY 8 HOURS AS NEEDED FOR ITCHING 60 capsule 3   insulin glargine (LANTUS SOLOSTAR) 100 UNIT/ML Solostar Pen Inject 24 Units into the skin daily. 1 mL 3   Ixekizumab (TALTZ) 80 MG/ML SOAJ Inject 80 mg into the skin every 28 (twenty-eight) days. For maintenance. 1 mL 4   losartan (COZAAR) 25 MG tablet TAKE 1 TABLET(25 MG) BY MOUTH DAILY 90 tablet 3   metFORMIN (GLUCOPHAGE) 1000 MG tablet TAKE 1 TABLET BY MOUTH TWICE  DAILY WITH MEALS 180 tablet 3   Multiple Vitamin (MULTIVITAMIN WITH  MINERALS) TABS tablet Take 1 tablet by mouth daily. 30 tablet 2   omeprazole (  PRILOSEC) 20 MG capsule TAKE 1 CAPSULE BY MOUTH TWICE  DAILY BEFORE MEALS (Patient taking differently: Take 40 mg by mouth daily.) 180 capsule 3   simvastatin (ZOCOR) 40 MG tablet TAKE 1 TABLET BY MOUTH AT  BEDTIME 90 tablet 3   SYRINGE-NEEDLE, DISP, 3 ML (BD SAFETYGLIDE SYRINGE/NEEDLE) 25G X 1" 3 ML MISC Use the needle for IM injection once a  month; or as directed. 30 each 1   tamsulosin (FLOMAX) 0.4 MG CAPS capsule TAKE 1 CAPSULE BY MOUTH DAILY 90 capsule 3   triamcinolone lotion (KENALOG) 0.1 % APPLY TO AFFECTED AREA(S)  TOPICALLY 3 TIMES DAILY 180 mL 1   clobetasol ointment (TEMOVATE) 0.05 % Apply topically.     HYDROcodone-acetaminophen (NORCO) 7.5-325 MG tablet Take 1 tablet by mouth every 6 (six) hours as needed. (Patient not taking: Reported on 10/15/2023)     No current facility-administered medications for this visit.     Marland Kitchen  PHYSICAL EXAMINATION:   Vitals:   10/15/23 1323  BP: 118/66  Pulse: 89  Resp: 16  Temp: 98 F (36.7 C)  SpO2: 99%      Filed Weights   10/15/23 1323  Weight: 230 lb 11.2 oz (104.6 kg)       Physical Exam Vitals and nursing note reviewed.  HENT:     Head: Normocephalic and atraumatic.     Mouth/Throat:     Pharynx: Oropharynx is clear.  Eyes:     Extraocular Movements: Extraocular movements intact.     Pupils: Pupils are equal, round, and reactive to light.  Cardiovascular:     Rate and Rhythm: Normal rate and regular rhythm.  Abdominal:     Palpations: Abdomen is soft.  Musculoskeletal:        General: Normal range of motion.     Cervical back: Normal range of motion.  Skin:    General: Skin is warm.  Neurological:     General: No focal deficit present.     Mental Status: He is alert and oriented to person, place, and time.  Psychiatric:        Behavior: Behavior normal.        Judgment: Judgment normal.      LABORATORY DATA:  I have reviewed  the data as listed Lab Results  Component Value Date   WBC 6.1 09/06/2023   HGB 11.2 (L) 09/06/2023   HCT 34.4 (L) 09/06/2023   MCV 88 09/06/2023   PLT 314 09/06/2023   Recent Labs    05/05/23 1201 05/07/23 0442 05/09/23 0445 05/10/23 0606 06/06/23 0831 09/06/23 0856  NA 127* 131* 127* 130* 139 137  K 3.3* 3.4* 3.4* 3.6 5.2 5.2  CL 83* 88* 94* 97* 100 100  CO2 29 30 25 24 23 21   GLUCOSE 256* 178* 209* 189* 144* 231*  BUN 10 8 13 16 19 13   CREATININE 0.92 0.90 0.90 0.90 0.96 1.09  CALCIUM 9.0 8.9 8.2* 8.5* 10.7* 9.8  GFRNONAA >60 >60 >60 >60  --   --   PROT 7.3  --   --   --  6.3 6.1  ALBUMIN 3.8  --   --   --  4.2 3.8*  AST 20  --   --   --  20 18  ALT 11  --   --   --  18 19  ALKPHOS 86  --   --   --  91 91  BILITOT 1.1  --   --   --  0.3 0.3  BILIDIR  --   --   --   --  0.17  --      No results found.  ASSESSMENT & PLAN:   Symptomatic anemia # Anemia- mild Hb 12; normocytic [since MAY 2023]-DEC 2023-  Iron studies; ferritin no evidence of iron deficiency; myeloma panel kappa lambda light chain; hepatitis screening; LDH haptoglobin; CRP-within normal limit.  Peripheral smear no evidence of schistocytes.  OCT 2024- Bone marrow Biopsy: Variably cellular bone marrow with trilineage hematopoiesis; The bone marrow is generally normocellular for age with trilineage  hematopoiesis.  There are mild nonspecific changes present, likely  secondary in nature.  Correlation with cytogenetic studies is  recommended. [place] 2024- OCT 2024-  Increased hepatic parenchymal echogenicity suggestive of steatosis.  No cholelithiasis or sonographic evidence for acute cholecystitis.  # Anemia currently 11 likely secondary to inflammation/infection [sec to toe infection]   # DM- Bil Foot ulcer- [Dr.Kline, podiatry];  s/p amputations- stable.   # B12 deficiency- 141 [March 2024-; [facility]]-Recommend STOPPING  monthly B 12 injections- ; MARCH 2025- -B 12 > 2000 ; recommend B12  1000 mcg every other  day.   # Psoriasis- on TNF blocker-TALTZ [Dr.Kowalski]-stable/improved.  # Diabetic DM [A1c 5.9]- on insulin- see above.    # DISPOSITION: # follow up in 6  months- MD; [facility] 1 weeks prior-labs- cbc/cmp; b12 levels;iron studies;ferritin- CRP- Dr.B      All questions were answered. The patient knows to call the clinic with any problems, questions or concerns.  Earna Coder, MD 10/15/2023 2:06 PM

## 2023-10-29 ENCOUNTER — Encounter: Payer: Self-pay | Admitting: Internal Medicine

## 2023-10-29 MED ORDER — PREGABALIN 50 MG PO CAPS: 50.0000 mg | ORAL_CAPSULE | Freq: Three times a day (TID) | ORAL | 1 refills | Status: DC

## 2023-10-29 MED ORDER — DULOXETINE HCL 60 MG PO CPEP
120.0000 mg | ORAL_CAPSULE | Freq: Every day | ORAL | 3 refills | Status: AC
Start: 2023-10-29 — End: ?

## 2023-10-30 DIAGNOSIS — L97511 Non-pressure chronic ulcer of other part of right foot limited to breakdown of skin: Secondary | ICD-10-CM | POA: Diagnosis not present

## 2023-10-30 DIAGNOSIS — S92332D Displaced fracture of third metatarsal bone, left foot, subsequent encounter for fracture with routine healing: Secondary | ICD-10-CM | POA: Diagnosis not present

## 2023-10-30 DIAGNOSIS — E11610 Type 2 diabetes mellitus with diabetic neuropathic arthropathy: Secondary | ICD-10-CM | POA: Diagnosis not present

## 2023-10-30 DIAGNOSIS — Z89431 Acquired absence of right foot: Secondary | ICD-10-CM | POA: Diagnosis not present

## 2023-10-30 DIAGNOSIS — L97519 Non-pressure chronic ulcer of other part of right foot with unspecified severity: Secondary | ICD-10-CM | POA: Diagnosis not present

## 2023-11-02 ENCOUNTER — Other Ambulatory Visit: Payer: 59

## 2023-11-02 ENCOUNTER — Ambulatory Visit: Payer: 59 | Admitting: Internal Medicine

## 2023-11-03 MED ORDER — GABAPENTIN 400 MG PO CAPS
800.0000 mg | ORAL_CAPSULE | Freq: Three times a day (TID) | ORAL | 3 refills | Status: AC
Start: 2023-11-03 — End: ?

## 2023-11-20 DIAGNOSIS — E11610 Type 2 diabetes mellitus with diabetic neuropathic arthropathy: Secondary | ICD-10-CM | POA: Diagnosis not present

## 2023-11-20 DIAGNOSIS — S92332D Displaced fracture of third metatarsal bone, left foot, subsequent encounter for fracture with routine healing: Secondary | ICD-10-CM | POA: Diagnosis not present

## 2023-11-20 DIAGNOSIS — L97511 Non-pressure chronic ulcer of other part of right foot limited to breakdown of skin: Secondary | ICD-10-CM | POA: Diagnosis not present

## 2023-12-04 ENCOUNTER — Encounter: Payer: Self-pay | Admitting: Internal Medicine

## 2023-12-04 ENCOUNTER — Ambulatory Visit (INDEPENDENT_AMBULATORY_CARE_PROVIDER_SITE_OTHER): Payer: 59 | Admitting: Internal Medicine

## 2023-12-04 VITALS — BP 136/68 | HR 81 | Temp 98.5°F | Ht 72.0 in | Wt 230.0 lb

## 2023-12-04 DIAGNOSIS — E1141 Type 2 diabetes mellitus with diabetic mononeuropathy: Secondary | ICD-10-CM | POA: Diagnosis not present

## 2023-12-04 DIAGNOSIS — E119 Type 2 diabetes mellitus without complications: Secondary | ICD-10-CM

## 2023-12-04 DIAGNOSIS — I7025 Atherosclerosis of native arteries of other extremities with ulceration: Secondary | ICD-10-CM

## 2023-12-04 DIAGNOSIS — Z794 Long term (current) use of insulin: Secondary | ICD-10-CM

## 2023-12-04 DIAGNOSIS — Z7984 Long term (current) use of oral hypoglycemic drugs: Secondary | ICD-10-CM

## 2023-12-04 DIAGNOSIS — I10 Essential (primary) hypertension: Secondary | ICD-10-CM | POA: Diagnosis not present

## 2023-12-04 LAB — POCT GLYCOSYLATED HEMOGLOBIN (HGB A1C): Hemoglobin A1C: 8.4 % — AB (ref 4.0–5.6)

## 2023-12-04 MED ORDER — DAPAGLIFLOZIN PROPANEDIOL 5 MG PO TABS: 5.0000 mg | ORAL_TABLET | Freq: Every day | ORAL | 3 refills | Status: AC

## 2023-12-04 NOTE — Assessment & Plan Note (Signed)
 BP Readings from Last 3 Encounters:  12/04/23 136/68  10/15/23 118/66  09/28/23 123/70   Controlled on the amlodpine 5mg  and losartan  25mg 

## 2023-12-04 NOTE — Assessment & Plan Note (Signed)
 Lab Results  Component Value Date   HGBA1C 8.4 (A) 12/04/2023   Better but still not acceptable Lantus  24 units Metformin  1000 bid Hypoglycemia with glipizide Will add farxiga  5mg --discussed side effects  TENS helps a little---more for hands (going to Dr Aloha Arnold and may need pain management) Alpha lipoic acid, gapapentin Lidocaine  not much help

## 2023-12-04 NOTE — Assessment & Plan Note (Signed)
 Ongoing care for right foot ulcers

## 2023-12-04 NOTE — Progress Notes (Signed)
 Subjective:    Patient ID: [name], male    DOB: September 15, 1955, 68 y.o.   MRN: [id number]  HPI Here for follow up of poorly controlled diabetes  Checking sugars occasionally --AM 130-140's Only one low sugar reaction---skipped a meal  Ongoing problems with foot fracture Has new cushion that is helping some---ulcers healing up  No chest pain or SOB No dizziness or syncope  Current Outpatient Medications on File Prior to Visit  Medication Sig Dispense Refill   Alcohol  Swabs  (ALCOHOL  PREP) 70 % PADS USE 1 PREP PAD TWICE DAILY 200 each 3   Alpha-Lipoic Acid 600 MG TABS Take by mouth. 3 in am and 1 in pm     amLODipine  (NORVASC ) 5 MG tablet TAKE 1 TABLET(5 MG) BY MOUTH DAILY 90 tablet 2   B-D UF III MINI PEN NEEDLES 31G X 5 MM MISC USE AS DIRECTED DAILY 90 each 3   CALCIUM-MAGNESIUM -ZINC  PO Take by mouth.     Cinnamon 500 MG capsule Take 500 mg by mouth 2 (two) times daily.     clobetasol ointment (TEMOVATE) 0.05 % Apply topically.     CONTOUR NEXT TEST test strip USE 1 TO 2 TIMES DAILY TO CHECK  BLOOD SUGAR 200 strip 3   DULoxetine  (CYMBALTA ) 60 MG capsule Take 2 capsules (120 mg total) by mouth daily. 180 capsule 3   finasteride  (PROSCAR ) 5 MG tablet TAKE 1 TABLET BY MOUTH DAILY 90 tablet 3   furosemide  (LASIX ) 20 MG tablet Take 1 tablet (20 mg total) by mouth daily as needed. 90 tablet 3   gabapentin  (NEURONTIN ) 400 MG capsule Take 2 capsules (800 mg total) by mouth 3 (three) times daily. 540 capsule 3   hydrocortisone  valerate ointment (WEST-CORT) 0.2 % APPLY TOPICALLY TO AFFECTED  AREA(S) TWICE DAILY 120 g 1   hydrOXYzine  (VISTARIL ) 25 MG capsule TAKE 1 CAPSULE(25 MG) BY MOUTH EVERY 8 HOURS AS NEEDED FOR ITCHING 60 capsule 3   insulin  glargine (LANTUS  SOLOSTAR) 100 UNIT/ML Solostar Pen Inject 24 Units into the skin daily. 1 mL 3   losartan  (COZAAR ) 25 MG tablet TAKE 1 TABLET(25 MG) BY MOUTH DAILY 90 tablet 3   metFORMIN  (GLUCOPHAGE ) 1000 MG tablet TAKE 1 TABLET BY MOUTH TWICE   DAILY WITH MEALS 180 tablet 3   Multiple Vitamin (MULTIVITAMIN WITH MINERALS) TABS tablet Take 1 tablet by mouth daily. 30 tablet 2   omeprazole  (PRILOSEC) 20 MG capsule TAKE 1 CAPSULE BY MOUTH TWICE  DAILY BEFORE MEALS (Patient taking differently: Take 40 mg by mouth daily.) 180 capsule 3   simvastatin  (ZOCOR ) 40 MG tablet TAKE 1 TABLET BY MOUTH AT  BEDTIME 90 tablet 3   SYRINGE-NEEDLE, DISP, 3 ML (BD SAFETYGLIDE SYRINGE/NEEDLE) 25G X 1" 3 ML MISC Use the needle for IM injection once a  month; or as directed. 30 each 1   tamsulosin  (FLOMAX ) 0.4 MG CAPS capsule TAKE 1 CAPSULE BY MOUTH DAILY 90 capsule 3   triamcinolone  lotion (KENALOG ) 0.1 % APPLY TO AFFECTED AREA(S)  TOPICALLY 3 TIMES DAILY 180 mL 1   cyanocobalamin  (VITAMIN B12) 1000 MCG/ML injection 1 ml injection-once a month. (Patient not taking: Reported on 12/04/2023) 12 mL 1   HYDROcodone -acetaminophen  (NORCO) 7.5-325 MG tablet Take 1 tablet by mouth every 6 (six) hours as needed. (Patient not taking: Reported on 12/04/2023)     No current facility-administered medications on file prior to visit.    Allergies  Allergen Reactions   Glipizide Other (See Comments)  REACTION: wt. gain, hands tingling   Lisinopril  Other (See Comments)    HYPERKALEMIA   Ramipril Cough   Ibuprofen     UNSPECIFIED REACTION    Bactrim  [Sulfamethoxazole -Trimethoprim ] Nausea And Vomiting and Rash    Past Medical History:  Diagnosis Date   Adenomatous polyp    Allergy     Anemia    Arthritis    feet    Bunion 04/02/2013   STATUS POST OP BUN REPAIR   Diabetes mellitus    Foot ulcer (HCC)    GERD (gastroesophageal reflux disease)    Gout    Hammertoe    Hyperlipidemia    Hypertension    Neuromuscular disorder (HCC)    neuropathy   Plantar flexed metatarsal    Psoriasis    Schatzki's ring     Past Surgical History:  Procedure Laterality Date   AMPUTATION TOE Left 05/08/2023   Procedure: AMPUTATION 5TH TOE WITH PARTIAL RAY RESECTION;   Surgeon: Angel Barba, DPM;  Location: [facility] OR[facility];  Service: Orthopedics/Podiatry;  Laterality: Left;   CARPAL TUNNEL RELEASE Left 08/24/2022   Procedure: CARPAL TUNNEL RELEASE;  Surgeon: Marlynn Singer, MD;  Location: [facility];  Service: Orthopedics;  Laterality: Left;   CATARACT EXTRACTION W/PHACO Left 03/21/2023   Procedure: CATARACT EXTRACTION PHACO AND INTRAOCULAR LENS PLACEMENT (IOC) LEFT MALYUGIN DIABETIC 9.88 1:01.4;  Surgeon: Annell Kidney, MD;  Location: [facility];  Service: Ophthalmology;  Laterality: Left;   CATARACT EXTRACTION W/PHACO Right 04/04/2023   Procedure: CATARACT EXTRACTION PHACO AND INTRAOCULAR LENS PLACEMENT (IOC) RIGHT MALYUGIN DIABETIC 15.31 01:15.1;  Surgeon: Annell Kidney, MD;  Location: [facility];  Service: Ophthalmology;  Laterality: Right;   COLONOSCOPY     FOOT AMPUTATION Right 01/11/2017   Dr Ollis Bi ([facility])--transmetatarsal   FOOT SURGERY Right 02/28/2013   Christell Cove HAM TOE2,3 PINS ,2ND MET OSTEOTOMY, 5TH MET W/SCREW   [phone], 04-2013   HIP FRACTURE SURGERY  1993   surgery x2   IR BONE MARROW BIOPSY & ASPIRATION  04/27/2023   LOWER EXTREMITY ANGIOGRAPHY Left 05/07/2023   Procedure: Lower Extremity Angiography;  Surgeon: Celso College, MD;  Location: [facility] INV[facility] LAB;  Service: Cardiovascular;  Laterality: Left;   NASAL SEPTOPLASTY W/ TURBINOPLASTY Bilateral 02/08/2023   Procedure: NASAL SEPTOPLASTY WITH TURBINATE REDUCTION;  Surgeon: Mellody Sprout, MD;  Location: [facility];  Service: ENT;  Laterality: Bilateral;   POLYPECTOMY     PROSTATE BIOPSY  10/10/2019   TOTAL HIP ARTHROPLASTY     Left hip replacement/femur fx 07/04, Screw removal left hip- [facility] 11/01    Family History  Problem Relation Age of Onset   Diabetes Mother    Diabetes Brother    Alzheimer's disease Maternal Aunt    Alzheimer's disease Cousin    Cancer Neg Hx    Colon cancer Neg Hx    Colon polyps Neg Hx    Rectal cancer Neg Hx    Stomach cancer  Neg Hx    Esophageal cancer Neg Hx     Social History   Socioeconomic History   Marital status: Married    Spouse name: Not on file   Number of children: 3   Years of education: Not on file   Highest education level: 12th grade  Occupational History   Occupation: PC ADMINISTRATOR    Employer: [employer]  Tobacco Use   Smoking status: Never    Passive exposure: Past   Smokeless tobacco: Never  Vaping Use   Vaping status: Never Used  Substance  and Sexual Activity   Alcohol  use: Yes    Alcohol /week: 1.0 standard drink of alcohol     Types: 1 Standard drinks or equivalent per week    Comment: occasional (DWI 1991)   Drug use: No   Sexual activity: Yes    Birth control/protection: None  Other Topics Concern   Not on file  Social History Narrative    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 With wife; and 2 dog.     Social Drivers of Corporate investment banker Strain: Low Risk  (09/04/2023)   Overall Financial Resource Strain (CARDIA)    Difficulty of Paying Living Expenses: Not hard at all  Food Insecurity: No Food Insecurity (09/04/2023)   Hunger Vital Sign    Worried About Running Out of Food in the Last Year: Never true    Ran Out of Food in the Last Year: Never true  Transportation Needs: No Transportation Needs (09/04/2023)   PRAPARE - Administrator, Civil Service (Medical): No    Lack of Transportation (Non-Medical): No  Physical Activity: Unknown (09/04/2023)   Exercise Vital Sign    Days of Exercise per Week: 0 days    Minutes of Exercise per Session: Not on file  Stress: No Stress Concern Present (09/04/2023)   [employer] of Occupational Health - Occupational Stress Questionnaire    Feeling of Stress : Only a little  Social Connections: Moderately Integrated (09/04/2023)   Social Connection and Isolation Panel [NHANES]    Frequency of Communication with Friends and Family: Twice a week     Frequency of Social Gatherings with Friends and Family: Once a week    Attends Religious Services: 1 to 4 times per year    Active Member of [employer] or Organizations: No    Attends Engineer, structural: Not on file    Marital Status: Married  Catering manager Violence: Not At Risk (05/06/2023)   Humiliation, Afraid, Rape, and Kick questionnaire    Fear of Current or Ex-Partner: No    Emotionally Abused: No    Physically Abused: No    Sexually Abused: No   Review of Systems Sleeps okay mostly Voids okay     Objective:   Physical Exam Constitutional:      Appearance: Normal appearance.  Cardiovascular:     Rate and Rhythm: Normal rate and regular rhythm.     Heart sounds: No murmur heard.    No gallop.     Comments: 1+ pedal pulses Pulmonary:     Effort: Pulmonary effort is normal.     Breath sounds: Normal breath sounds. No wheezing or rales.  Musculoskeletal:     Cervical back: Neck supple.     Right lower leg: No edema.     Left lower leg: No edema.  Lymphadenopathy:     Cervical: No cervical adenopathy.  Skin:    Comments: Dressing on right foot--and going right back to podiatry (after transmetatarsal amp.) No left  foot lesions  Neurological:     Mental Status: He is alert.            Assessment & Plan:

## 2023-12-06 DIAGNOSIS — M76822 Posterior tibial tendinitis, left leg: Secondary | ICD-10-CM | POA: Diagnosis not present

## 2023-12-06 DIAGNOSIS — Z89431 Acquired absence of right foot: Secondary | ICD-10-CM | POA: Diagnosis not present

## 2023-12-06 DIAGNOSIS — L97511 Non-pressure chronic ulcer of other part of right foot limited to breakdown of skin: Secondary | ICD-10-CM | POA: Diagnosis not present

## 2023-12-06 DIAGNOSIS — M25572 Pain in left ankle and joints of left foot: Secondary | ICD-10-CM | POA: Diagnosis not present

## 2023-12-06 DIAGNOSIS — E11610 Type 2 diabetes mellitus with diabetic neuropathic arthropathy: Secondary | ICD-10-CM | POA: Diagnosis not present

## 2023-12-10 DIAGNOSIS — G5603 Carpal tunnel syndrome, bilateral upper limbs: Secondary | ICD-10-CM | POA: Diagnosis not present

## 2023-12-11 ENCOUNTER — Encounter (INDEPENDENT_AMBULATORY_CARE_PROVIDER_SITE_OTHER): Payer: Self-pay

## 2023-12-19 DIAGNOSIS — L820 Inflamed seborrheic keratosis: Secondary | ICD-10-CM | POA: Diagnosis not present

## 2023-12-19 DIAGNOSIS — D235 Other benign neoplasm of skin of trunk: Secondary | ICD-10-CM | POA: Diagnosis not present

## 2023-12-19 DIAGNOSIS — L400 Psoriasis vulgaris: Secondary | ICD-10-CM | POA: Diagnosis not present

## 2023-12-20 DIAGNOSIS — L400 Psoriasis vulgaris: Secondary | ICD-10-CM | POA: Diagnosis not present

## 2023-12-20 DIAGNOSIS — Z79899 Other long term (current) drug therapy: Secondary | ICD-10-CM | POA: Diagnosis not present

## 2023-12-31 DIAGNOSIS — M792 Neuralgia and neuritis, unspecified: Secondary | ICD-10-CM | POA: Diagnosis not present

## 2023-12-31 DIAGNOSIS — Z79899 Other long term (current) drug therapy: Secondary | ICD-10-CM | POA: Diagnosis not present

## 2023-12-31 DIAGNOSIS — R200 Anesthesia of skin: Secondary | ICD-10-CM | POA: Diagnosis not present

## 2024-01-03 DIAGNOSIS — Z89431 Acquired absence of right foot: Secondary | ICD-10-CM | POA: Diagnosis not present

## 2024-01-03 DIAGNOSIS — E11610 Type 2 diabetes mellitus with diabetic neuropathic arthropathy: Secondary | ICD-10-CM | POA: Diagnosis not present

## 2024-01-03 DIAGNOSIS — L97511 Non-pressure chronic ulcer of other part of right foot limited to breakdown of skin: Secondary | ICD-10-CM | POA: Diagnosis not present

## 2024-01-28 ENCOUNTER — Other Ambulatory Visit: Payer: Self-pay | Admitting: Internal Medicine

## 2024-01-28 DIAGNOSIS — M791 Myalgia, unspecified site: Secondary | ICD-10-CM | POA: Diagnosis not present

## 2024-01-28 DIAGNOSIS — R200 Anesthesia of skin: Secondary | ICD-10-CM | POA: Diagnosis not present

## 2024-01-28 DIAGNOSIS — Z79899 Other long term (current) drug therapy: Secondary | ICD-10-CM | POA: Diagnosis not present

## 2024-01-28 MED ORDER — FUROSEMIDE 20 MG PO TABS: 20.0000 mg | ORAL_TABLET | Freq: Every day | ORAL | 0 refills | Status: DC | PRN

## 2024-01-28 NOTE — Telephone Encounter (Signed)
 Copied from CRM [id number]. Topic: Clinical - Medication Refill >> Jan 28, 2024  2:24 PM Burnard DEL wrote: Medication: furosemide  (LASIX ) 20 MG tablet  Has the patient contacted their pharmacy? No (Agent: If no, request that the patient contact the pharmacy for the refill. If patient does not wish to contact the pharmacy document the reason why and proceed with request.) (Agent: If yes, when and what did the pharmacy advise?)  This is the patient's preferred pharmacy:  [id number] [place], [place] - [address] AT [facility] OF [address] [place] [address] Phone: [phone] Fax: [phone]  Is this the correct pharmacy for this prescription? Yes If no, delete pharmacy and type the correct one.   Has the prescription been filled recently? No  Is the patient out of the medication? No(few pills)  Has the patient been seen for an appointment in the last year OR does the patient have an upcoming appointment? Yes  Can we respond through MyChart? Yes  Agent: Please be advised that Rx refills may take up to 3 business days. We ask that you follow-up with your pharmacy.

## 2024-02-04 ENCOUNTER — Encounter: Payer: Self-pay | Admitting: Internal Medicine

## 2024-02-04 DIAGNOSIS — L97511 Non-pressure chronic ulcer of other part of right foot limited to breakdown of skin: Secondary | ICD-10-CM | POA: Diagnosis not present

## 2024-02-04 DIAGNOSIS — E11610 Type 2 diabetes mellitus with diabetic neuropathic arthropathy: Secondary | ICD-10-CM | POA: Diagnosis not present

## 2024-02-04 DIAGNOSIS — Z89431 Acquired absence of right foot: Secondary | ICD-10-CM | POA: Diagnosis not present

## 2024-02-04 MED ORDER — OMEPRAZOLE 20 MG PO CPDR
20.0000 mg | DELAYED_RELEASE_CAPSULE | Freq: Two times a day (BID) | ORAL | 3 refills | Status: DC
Start: 2024-02-04 — End: 2024-05-02

## 2024-02-04 MED ORDER — BD PEN NEEDLE MINI U/F 31G X 5 MM MISC
3 refills | Status: DC
Start: 2024-02-04 — End: 2024-06-13

## 2024-02-06 DIAGNOSIS — L814 Other melanin hyperpigmentation: Secondary | ICD-10-CM | POA: Diagnosis not present

## 2024-02-06 DIAGNOSIS — L400 Psoriasis vulgaris: Secondary | ICD-10-CM | POA: Diagnosis not present

## 2024-02-06 DIAGNOSIS — L821 Other seborrheic keratosis: Secondary | ICD-10-CM | POA: Diagnosis not present

## 2024-02-06 DIAGNOSIS — D225 Melanocytic nevi of trunk: Secondary | ICD-10-CM | POA: Diagnosis not present

## 2024-02-11 ENCOUNTER — Other Ambulatory Visit: Payer: Self-pay | Admitting: Internal Medicine

## 2024-02-19 DIAGNOSIS — M9702XA Periprosthetic fracture around internal prosthetic left hip joint, initial encounter: Secondary | ICD-10-CM | POA: Diagnosis not present

## 2024-02-19 DIAGNOSIS — S72112A Displaced fracture of greater trochanter of left femur, initial encounter for closed fracture: Secondary | ICD-10-CM | POA: Diagnosis not present

## 2024-02-19 DIAGNOSIS — R0781 Pleurodynia: Secondary | ICD-10-CM | POA: Diagnosis not present

## 2024-02-19 DIAGNOSIS — Z96642 Presence of left artificial hip joint: Secondary | ICD-10-CM | POA: Diagnosis not present

## 2024-02-19 DIAGNOSIS — M25552 Pain in left hip: Secondary | ICD-10-CM | POA: Diagnosis not present

## 2024-02-20 ENCOUNTER — Other Ambulatory Visit: Payer: Self-pay | Admitting: Orthopedic Surgery

## 2024-02-20 ENCOUNTER — Ambulatory Visit
Admission: RE | Admit: 2024-02-20 | Discharge: 2024-02-20 | Disposition: A | Discharge status: Home / Self Care | Source: Ambulatory Visit | Attending: Orthopedic Surgery | Admitting: Orthopedic Surgery

## 2024-02-20 DIAGNOSIS — K5730 Diverticulosis of large intestine without perforation or abscess without bleeding: Secondary | ICD-10-CM | POA: Diagnosis not present

## 2024-02-20 DIAGNOSIS — Z96642 Presence of left artificial hip joint: Secondary | ICD-10-CM | POA: Insufficient documentation

## 2024-02-20 DIAGNOSIS — K4090 Unilateral inguinal hernia, without obstruction or gangrene, not specified as recurrent: Secondary | ICD-10-CM | POA: Diagnosis not present

## 2024-02-20 DIAGNOSIS — S72112A Displaced fracture of greater trochanter of left femur, initial encounter for closed fracture: Secondary | ICD-10-CM | POA: Diagnosis not present

## 2024-02-20 DIAGNOSIS — M9702XA Periprosthetic fracture around internal prosthetic left hip joint, initial encounter: Secondary | ICD-10-CM | POA: Insufficient documentation

## 2024-02-26 DIAGNOSIS — E1169 Type 2 diabetes mellitus with other specified complication: Secondary | ICD-10-CM | POA: Diagnosis not present

## 2024-02-26 DIAGNOSIS — Z96642 Presence of left artificial hip joint: Secondary | ICD-10-CM | POA: Diagnosis not present

## 2024-02-26 DIAGNOSIS — M9702XD Periprosthetic fracture around internal prosthetic left hip joint, subsequent encounter: Secondary | ICD-10-CM | POA: Diagnosis not present

## 2024-02-26 DIAGNOSIS — S72112D Displaced fracture of greater trochanter of left femur, subsequent encounter for closed fracture with routine healing: Secondary | ICD-10-CM | POA: Diagnosis not present

## 2024-03-04 ENCOUNTER — Encounter: Payer: Self-pay | Admitting: Internal Medicine

## 2024-03-05 ENCOUNTER — Telehealth: Payer: Self-pay | Admitting: Internal Medicine

## 2024-03-05 ENCOUNTER — Ambulatory Visit (INDEPENDENT_AMBULATORY_CARE_PROVIDER_SITE_OTHER): Admitting: Internal Medicine

## 2024-03-05 ENCOUNTER — Encounter: Payer: Self-pay | Admitting: Internal Medicine

## 2024-03-05 VITALS — BP 118/68 | HR 82 | Temp 98.5°F | Ht 72.5 in | Wt 230.0 lb

## 2024-03-05 DIAGNOSIS — S98132A Complete traumatic amputation of one left lesser toe, initial encounter: Secondary | ICD-10-CM | POA: Insufficient documentation

## 2024-03-05 DIAGNOSIS — I7025 Atherosclerosis of native arteries of other extremities with ulceration: Secondary | ICD-10-CM | POA: Diagnosis not present

## 2024-03-05 DIAGNOSIS — Z794 Long term (current) use of insulin: Secondary | ICD-10-CM | POA: Diagnosis not present

## 2024-03-05 DIAGNOSIS — Z0000 Encounter for general adult medical examination without abnormal findings: Secondary | ICD-10-CM

## 2024-03-05 DIAGNOSIS — I10 Essential (primary) hypertension: Secondary | ICD-10-CM | POA: Diagnosis not present

## 2024-03-05 DIAGNOSIS — E119 Type 2 diabetes mellitus without complications: Secondary | ICD-10-CM | POA: Diagnosis not present

## 2024-03-05 DIAGNOSIS — E1140 Type 2 diabetes mellitus with diabetic neuropathy, unspecified: Secondary | ICD-10-CM

## 2024-03-05 DIAGNOSIS — Z89431 Acquired absence of right foot: Secondary | ICD-10-CM

## 2024-03-05 DIAGNOSIS — Z7984 Long term (current) use of oral hypoglycemic drugs: Secondary | ICD-10-CM

## 2024-03-05 LAB — LIPID PANEL
Cholesterol: 91 mg/dL (ref 0–200)
HDL: 42 mg/dL (ref >39.00)
LDL Cholesterol: 34 mg/dL (ref 0–99)
NonHDL: 49.26
Total CHOL/HDL Ratio: 2
Triglycerides: 77 mg/dL (ref 0.0–149.0)
VLDL: 15.4 mg/dL (ref 0.0–40.0)

## 2024-03-05 LAB — COMPREHENSIVE METABOLIC PANEL WITH GFR
ALT: 13 U/L (ref 0–53)
AST: 15 U/L (ref 0–37)
Albumin: 4.4 g/dL (ref 3.5–5.2)
Alkaline Phosphatase: 116 U/L (ref 39–117)
BUN: 31 mg/dL — ABNORMAL HIGH (ref 6–23)
CO2: 28 meq/L (ref 19–32)
Calcium: 9.4 mg/dL (ref 8.4–10.5)
Chloride: 99 meq/L (ref 96–112)
Creatinine, Ser: 1.27 mg/dL (ref 0.40–1.50)
GFR: 58.11 mL/min — ABNORMAL LOW (ref >60.00)
Glucose, Bld: 133 mg/dL — ABNORMAL HIGH (ref 70–99)
Potassium: 5.1 meq/L (ref 3.5–5.1)
Sodium: 136 meq/L (ref 135–145)
Total Bilirubin: 0.5 mg/dL (ref 0.2–1.2)
Total Protein: 6.6 g/dL (ref 6.0–8.3)

## 2024-03-05 LAB — CBC
HCT: 30.7 % — ABNORMAL LOW (ref 39.0–52.0)
Hemoglobin: 10.3 g/dL — ABNORMAL LOW (ref 13.0–17.0)
MCHC: 33.7 g/dL (ref 30.0–36.0)
MCV: 87.2 fl (ref 78.0–100.0)
Platelets: 444 K/uL — ABNORMAL HIGH (ref 150.0–400.0)
RBC: 3.51 Mil/uL — ABNORMAL LOW (ref 4.22–5.81)
RDW: 14 % (ref 11.5–15.5)
WBC: 5.6 K/uL (ref 4.0–10.5)

## 2024-03-05 LAB — HEMOGLOBIN A1C: Hgb A1c MFr Bld: 8.1 % — ABNORMAL HIGH (ref 4.6–6.5)

## 2024-03-05 LAB — MICROALBUMIN / CREATININE URINE RATIO
Creatinine,U: 50.1 mg/dL
Microalb Creat Ratio: UNDETERMINED mg/g (ref 0.0–30.0)
Microalb, Ur: 0.7 mg/dL (ref 0.0–1.9)

## 2024-03-05 LAB — HM DIABETES FOOT EXAM

## 2024-03-05 MED ORDER — METFORMIN HCL 1000 MG PO TABS: 1000.0000 mg | ORAL_TABLET | Freq: Two times a day (BID) | ORAL | 3 refills | Status: AC

## 2024-03-05 NOTE — Telephone Encounter (Signed)
 Spoke to pt, sch toc for 09/06/23

## 2024-03-05 NOTE — Telephone Encounter (Signed)
 Please call and schedule patient TOC with [place]. Thank you

## 2024-03-05 NOTE — Assessment & Plan Note (Signed)
 Continues with the podiatrist

## 2024-03-05 NOTE — Progress Notes (Addendum)
 Subjective:    Patient ID: [name], male    DOB: Jul 09, 1956, 68 y.o.   MRN: [id number]  HPI Here for Welcome to Medicare visit and follow up of chronic health conditions  [place] Minimally displaced greater trochanter hip fracture--no surgery Walking with cane  Ongoing severe pain in hands Goes to pain doctor On oxycodone  10mg  up to 5 per day  Sugars are up and down AM can vary from 100-160 Late afternoon is usually 160-170 Farxiga  seems to be helping No problems on this---already voids a lot  Ongoing ulcer on right foot--also a split there Left foot is fine--just 5th toe amputation in the past  No chest pain No SOB No dizziness or syncope Mild edema--on left (due to fracture) No palpitations  Current Outpatient Medications on File Prior to Visit  Medication Sig Dispense Refill   Alcohol  Swabs  (ALCOHOL  PREP) 70 % PADS USE 1 PREP PAD TWICE DAILY 200 each 3   Alpha-Lipoic Acid 600 MG TABS Take by mouth. 3 in am and 1 in pm     amLODipine  (NORVASC ) 5 MG tablet TAKE 1 TABLET(5 MG) BY MOUTH DAILY 90 tablet 3   CALCIUM-MAGNESIUM -ZINC  PO Take by mouth.     Cinnamon 500 MG capsule Take 500 mg by mouth 2 (two) times daily.     clobetasol ointment (TEMOVATE) 0.05 % Apply topically.     CONTOUR NEXT TEST test strip USE 1 TO 2 TIMES DAILY TO CHECK  BLOOD SUGAR 200 strip 3   cyanocobalamin  (VITAMIN B12) 1000 MCG/ML injection 1 ml injection-once a month. 12 mL 1   dapagliflozin  propanediol (FARXIGA ) 5 MG TABS tablet Take 1 tablet (5 mg total) by mouth daily. 90 tablet 3   DULoxetine  (CYMBALTA ) 60 MG capsule Take 2 capsules (120 mg total) by mouth daily. 180 capsule 3   finasteride  (PROSCAR ) 5 MG tablet TAKE 1 TABLET BY MOUTH DAILY 90 tablet 3   furosemide  (LASIX ) 20 MG tablet Take 1 tablet (20 mg total) by mouth daily as needed. 90 tablet 0   gabapentin  (NEURONTIN ) 400 MG capsule Take 2 capsules (800 mg total) by mouth 3 (three) times daily. 540 capsule 3    hydrocortisone  valerate ointment (WEST-CORT) 0.2 % APPLY TOPICALLY TO AFFECTED  AREA(S) TWICE DAILY 120 g 1   hydrOXYzine  (VISTARIL ) 25 MG capsule TAKE 1 CAPSULE(25 MG) BY MOUTH EVERY 8 HOURS AS NEEDED FOR ITCHING 60 capsule 3   insulin  glargine (LANTUS  SOLOSTAR) 100 UNIT/ML Solostar Pen Inject 24 Units into the skin daily. 1 mL 3   Insulin  Pen Needle (B-D UF III MINI PEN NEEDLES) 31G X 5 MM MISC USE AS DIRECTED DAILY 90 each 3   losartan  (COZAAR ) 25 MG tablet TAKE 1 TABLET(25 MG) BY MOUTH DAILY 90 tablet 3   metFORMIN  (GLUCOPHAGE ) 1000 MG tablet TAKE 1 TABLET BY MOUTH TWICE  DAILY WITH MEALS 180 tablet 3   Multiple Vitamin (MULTIVITAMIN WITH MINERALS) TABS tablet Take 1 tablet by mouth daily. 30 tablet 2   omeprazole  (PRILOSEC) 20 MG capsule Take 1 capsule (20 mg total) by mouth 2 (two) times daily before a meal. TAKE 1 CAPSULE BY MOUTH TWICE  DAILY BEFORE MEALS 180 capsule 3   Oxycodone  HCl 10 MG TABS Take 1 tablet by mouth 5 (five) times daily.     simvastatin  (ZOCOR ) 40 MG tablet TAKE 1 TABLET BY MOUTH AT  BEDTIME 90 tablet 3   SYRINGE-NEEDLE, DISP, 3 ML (BD SAFETYGLIDE SYRINGE/NEEDLE) 25G  X 1 3 ML MISC Use the needle for IM injection once a  month; or as directed. 30 each 1   tamsulosin  (FLOMAX ) 0.4 MG CAPS capsule TAKE 1 CAPSULE BY MOUTH DAILY 90 capsule 3   triamcinolone  lotion (KENALOG ) 0.1 % APPLY TO AFFECTED AREA(S)  TOPICALLY 3 TIMES DAILY 180 mL 1   No current facility-administered medications on file prior to visit.    Allergies  Allergen Reactions   Glipizide Other (See Comments)    REACTION: wt. gain, hands tingling   Lisinopril  Other (See Comments)    HYPERKALEMIA   Ramipril Cough   Ibuprofen     UNSPECIFIED REACTION    Bactrim  [Sulfamethoxazole -Trimethoprim ] Nausea And Vomiting and Rash    Past Medical History:  Diagnosis Date   Adenomatous polyp    Allergy     Anemia    Arthritis    feet    Bunion 04/02/2013   STATUS POST OP BUN REPAIR   Diabetes mellitus     Foot ulcer (HCC)    GERD (gastroesophageal reflux disease)    Gout    Hammertoe    Hyperlipidemia    Hypertension    Neuromuscular disorder (HCC)    neuropathy   Plantar flexed metatarsal    Psoriasis    Schatzki's ring     Past Surgical History:  Procedure Laterality Date   AMPUTATION TOE Left 05/08/2023   Procedure: AMPUTATION 5TH TOE WITH PARTIAL RAY RESECTION;  Surgeon: Neill Boas, DPM;  Location: [facility];  Service: Orthopedics/Podiatry;  Laterality: Left;   CARPAL TUNNEL RELEASE Left 08/24/2022   Procedure: CARPAL TUNNEL RELEASE;  Surgeon: Cleotilde Barrio, MD;  Location: [facility];  Service: Orthopedics;  Laterality: Left;   CATARACT EXTRACTION W/PHACO Left 03/21/2023   Procedure: CATARACT EXTRACTION PHACO AND INTRAOCULAR LENS PLACEMENT (IOC) LEFT MALYUGIN DIABETIC 9.88 1:01.4;  Surgeon: Mittie Gaskin, MD;  Location: [facility];  Service: Ophthalmology;  Laterality: Left;   CATARACT EXTRACTION W/PHACO Right 04/04/2023   Procedure: CATARACT EXTRACTION PHACO AND INTRAOCULAR LENS PLACEMENT (IOC) RIGHT MALYUGIN DIABETIC 15.31 01:15.1;  Surgeon: Mittie Gaskin, MD;  Location: [facility];  Service: Ophthalmology;  Laterality: Right;   COLONOSCOPY     FOOT AMPUTATION Right 01/11/2017   Dr Tisa ([facility])--transmetatarsal   FOOT SURGERY Right 02/28/2013   ALLEEN HAM TOE2,3 PINS ,2ND MET OSTEOTOMY, 5TH MET W/SCREW   [phone], 04-2013   HIP FRACTURE SURGERY  1993   surgery x2   IR BONE MARROW BIOPSY & ASPIRATION  04/27/2023   LOWER EXTREMITY ANGIOGRAPHY Left 05/07/2023   Procedure: Lower Extremity Angiography;  Surgeon: Marea Selinda RAMAN, MD;  Location: [facility];  Service: Cardiovascular;  Laterality: Left;   NASAL SEPTOPLASTY W/ TURBINOPLASTY Bilateral 02/08/2023   Procedure: NASAL SEPTOPLASTY WITH TURBINATE REDUCTION;  Surgeon: Edda Mt, MD;  Location: [facility];  Service: ENT;  Laterality: Bilateral;   POLYPECTOMY     PROSTATE BIOPSY   10/10/2019   TOTAL HIP ARTHROPLASTY     Left hip replacement/femur fx 07/04, Screw removal left hip- Hines 11/01    Family History  Problem Relation Age of Onset   Diabetes Mother    Diabetes Brother    Alzheimer's disease Maternal Aunt    Alzheimer's disease Cousin    Cancer Neg Hx    Colon cancer Neg Hx    Colon polyps Neg Hx    Rectal cancer Neg Hx    Stomach cancer Neg Hx    Esophageal cancer Neg Hx  Social History   Socioeconomic History   Marital status: Married    Spouse name: Not on file   Number of children: 3   Years of education: Not on file   Highest education level: 12th grade  Occupational History   Occupation: PC ADMINISTRATOR    Employer: [employer]    Comment: Retired 3/25  Tobacco Use   Smoking status: Never    Passive exposure: Past   Smokeless tobacco: Never  Vaping Use   Vaping status: Never Used  Substance and Sexual Activity   Alcohol  use: Yes    Alcohol /week: 1.0 standard drink of alcohol     Types: 1 Standard drinks or equivalent per week    Comment: occasional (DWI 1991)   Drug use: No   Sexual activity: Yes    Birth control/protection: None  Other Topics Concern   Not on file  Social History Narrative   alcohol  2-3 times a week; never smoked; lives in [place]. With wife; and 2 dog.        No living will   Wife would be health care POA---no clear alternate   Would accept resuscitation   No feeding tube if cognitively unaware   Social Drivers of Health   Financial Resource Strain: Low Risk  (03/03/2024)   Overall Financial Resource Strain (CARDIA)    Difficulty of Paying Living Expenses: Not hard at all  Food Insecurity: No Food Insecurity (03/03/2024)   Hunger Vital Sign    Worried About Running Out of Food in the Last Year: Never true    Ran Out of Food in the Last Year: Never true  Transportation Needs: No Transportation Needs (03/03/2024)   PRAPARE - Administrator, Civil Service (Medical): No    Lack of  Transportation (Non-Medical): No  Physical Activity: Inactive (03/03/2024)   Exercise Vital Sign    Days of Exercise per Week: 0 days    Minutes of Exercise per Session: Not on file  Stress: No Stress Concern Present (03/03/2024)   [employer] of Occupational Health - Occupational Stress Questionnaire    Feeling of Stress: Only a little  Social Connections: Unknown (03/03/2024)   Social Connection and Isolation Panel    Frequency of Communication with Friends and Family: Twice a week    Frequency of Social Gatherings with Friends and Family: Patient declined    Attends Religious Services: More than 4 times per year    Active Member of [employer] or Organizations: No    Attends Engineer, structural: Not on file    Marital Status: Married  Catering manager Violence: Not At Risk (03/04/2024)   Humiliation, Afraid, Rape, and Kick questionnaire    Fear of Current or Ex-Partner: No    Emotionally Abused: No    Physically Abused: No    Sexually Abused: No   Review of Systems Appetite is okay Weight is stable Some trouble sleeping in past 2 weeks--since the fall Teeth are okay No heartburn on the omeprazole  Bowels move okay    Objective:   Physical Exam Constitutional:      Appearance: Normal appearance.  HENT:     Mouth/Throat:     Pharynx: No oropharyngeal exudate or posterior oropharyngeal erythema.  Eyes:     Conjunctiva/sclera: Conjunctivae normal.     Pupils: Pupils are equal, round, and reactive to light.  Cardiovascular:     Rate and Rhythm: Normal rate and regular rhythm.     Heart sounds: No murmur heard.    No  gallop.     Comments: Faint pedal pulses Pulmonary:     Effort: Pulmonary effort is normal.     Breath sounds: Normal breath sounds. No wheezing or rales.  Abdominal:     Palpations: Abdomen is soft.     Tenderness: There is no abdominal tenderness.  Musculoskeletal:     Cervical back: Neck supple.     Right lower leg: No edema.     Left lower  leg: No edema.  Lymphadenopathy:     Cervical: No cervical adenopathy.  Skin:    Findings: No lesion or rash.     Comments: Transmetatarsal amputation on right---chronic ulcer Left foot no lesions---amputation of 5th toe  Neurological:     General: No focal deficit present.     Mental Status: He is alert and oriented to person, place, and time.     Comments: Almost no sensation in feet  Psychiatric:        Mood and Affect: Mood normal.        Behavior: Behavior normal.            Assessment & Plan:    Subjective:    [name] is a 68 y.o. male who presents for a Welcome to Medicare exam.   Cardiac Risk Factors include: advanced age (>70men, >41 women);male gender  Questions for visit were answered over the phone on 03/04/24 No changes     Objective:    Today's Vitals   03/04/24 1608 03/05/24 0731  BP:  118/68  Pulse:  82  Temp:  98.5 F (36.9 C)  TempSrc:  Oral  SpO2:  94%  Weight:  230 lb (104.3 kg)  Height:  6' 0.5 (1.842 m)  PainSc: 5     Body mass index is 30.76 kg/m.  Medications Outpatient Encounter Medications as of 03/05/2024  Medication Sig   Alcohol  Swabs  (ALCOHOL  PREP) 70 % PADS USE 1 PREP PAD TWICE DAILY   Alpha-Lipoic Acid 600 MG TABS Take by mouth. 3 in am and 1 in pm   amLODipine  (NORVASC ) 5 MG tablet TAKE 1 TABLET(5 MG) BY MOUTH DAILY   CALCIUM-MAGNESIUM -ZINC  PO Take by mouth.   Cinnamon 500 MG capsule Take 500 mg by mouth 2 (two) times daily.   clobetasol ointment (TEMOVATE) 0.05 % Apply topically.   CONTOUR NEXT TEST test strip USE 1 TO 2 TIMES DAILY TO CHECK  BLOOD SUGAR   cyanocobalamin  (VITAMIN B12) 1000 MCG/ML injection 1 ml injection-once a month.   dapagliflozin  propanediol (FARXIGA ) 5 MG TABS tablet Take 1 tablet (5 mg total) by mouth daily.   DULoxetine  (CYMBALTA ) 60 MG capsule Take 2 capsules (120 mg total) by mouth daily.   finasteride  (PROSCAR ) 5 MG tablet TAKE 1 TABLET BY MOUTH DAILY   furosemide  (LASIX ) 20 MG tablet  Take 1 tablet (20 mg total) by mouth daily as needed.   gabapentin  (NEURONTIN ) 400 MG capsule Take 2 capsules (800 mg total) by mouth 3 (three) times daily.   hydrocortisone  valerate ointment (WEST-CORT) 0.2 % APPLY TOPICALLY TO AFFECTED  AREA(S) TWICE DAILY   hydrOXYzine  (VISTARIL ) 25 MG capsule TAKE 1 CAPSULE(25 MG) BY MOUTH EVERY 8 HOURS AS NEEDED FOR ITCHING   insulin  glargine (LANTUS  SOLOSTAR) 100 UNIT/ML Solostar Pen Inject 24 Units into the skin daily.   Insulin  Pen Needle (B-D UF III MINI PEN NEEDLES) 31G X 5 MM MISC USE AS DIRECTED DAILY   losartan  (COZAAR ) 25 MG tablet TAKE 1 TABLET(25 MG) BY MOUTH DAILY  metFORMIN  (GLUCOPHAGE ) 1000 MG tablet TAKE 1 TABLET BY MOUTH TWICE  DAILY WITH MEALS   Multiple Vitamin (MULTIVITAMIN WITH MINERALS) TABS tablet Take 1 tablet by mouth daily.   omeprazole  (PRILOSEC) 20 MG capsule Take 1 capsule (20 mg total) by mouth 2 (two) times daily before a meal. TAKE 1 CAPSULE BY MOUTH TWICE  DAILY BEFORE MEALS   Oxycodone  HCl 10 MG TABS Take 1 tablet by mouth 5 (five) times daily.   simvastatin  (ZOCOR ) 40 MG tablet TAKE 1 TABLET BY MOUTH AT  BEDTIME   SYRINGE-NEEDLE, DISP, 3 ML (BD SAFETYGLIDE SYRINGE/NEEDLE) 25G X 1 3 ML MISC Use the needle for IM injection once a  month; or as directed.   tamsulosin  (FLOMAX ) 0.4 MG CAPS capsule TAKE 1 CAPSULE BY MOUTH DAILY   triamcinolone  lotion (KENALOG ) 0.1 % APPLY TO AFFECTED AREA(S)  TOPICALLY 3 TIMES DAILY   HYDROcodone -acetaminophen  (NORCO) 7.5-325 MG tablet Take 1 tablet by mouth every 6 (six) hours as needed. (Patient not taking: Reported on 12/04/2023)   No facility-administered encounter medications on file as of 03/05/2024.     History: Past Medical History:  Diagnosis Date   Adenomatous polyp    Allergy     Anemia    Arthritis    feet    Bunion 04/02/2013   STATUS POST OP BUN REPAIR   Diabetes mellitus    Foot ulcer (HCC)    GERD (gastroesophageal reflux disease)    Gout    Hammertoe     Hyperlipidemia    Hypertension    Neuromuscular disorder (HCC)    neuropathy   Plantar flexed metatarsal    Psoriasis    Schatzki's ring    Past Surgical History:  Procedure Laterality Date   AMPUTATION TOE Left 05/08/2023   Procedure: AMPUTATION 5TH TOE WITH PARTIAL RAY RESECTION;  Surgeon: Neill Boas, DPM;  Location: [facility];  Service: Orthopedics/Podiatry;  Laterality: Left;   CARPAL TUNNEL RELEASE Left 08/24/2022   Procedure: CARPAL TUNNEL RELEASE;  Surgeon: Cleotilde Barrio, MD;  Location: [facility];  Service: Orthopedics;  Laterality: Left;   CATARACT EXTRACTION W/PHACO Left 03/21/2023   Procedure: CATARACT EXTRACTION PHACO AND INTRAOCULAR LENS PLACEMENT (IOC) LEFT MALYUGIN DIABETIC 9.88 1:01.4;  Surgeon: Mittie Gaskin, MD;  Location: [facility];  Service: Ophthalmology;  Laterality: Left;   CATARACT EXTRACTION W/PHACO Right 04/04/2023   Procedure: CATARACT EXTRACTION PHACO AND INTRAOCULAR LENS PLACEMENT (IOC) RIGHT MALYUGIN DIABETIC 15.31 01:15.1;  Surgeon: Mittie Gaskin, MD;  Location: [facility];  Service: Ophthalmology;  Laterality: Right;   COLONOSCOPY     FOOT AMPUTATION Right 01/11/2017   Dr Tisa ([facility])--transmetatarsal   FOOT SURGERY Right 02/28/2013   ALLEEN HAM TOE2,3 PINS ,2ND MET OSTEOTOMY, 5TH MET W/SCREW   [phone], 04-2013   HIP FRACTURE SURGERY  1993   surgery x2   IR BONE MARROW BIOPSY & ASPIRATION  04/27/2023   LOWER EXTREMITY ANGIOGRAPHY Left 05/07/2023   Procedure: Lower Extremity Angiography;  Surgeon: Marea Selinda RAMAN, MD;  Location: [facility];  Service: Cardiovascular;  Laterality: Left;   NASAL SEPTOPLASTY W/ TURBINOPLASTY Bilateral 02/08/2023   Procedure: NASAL SEPTOPLASTY WITH TURBINATE REDUCTION;  Surgeon: Edda Mt, MD;  Location: [facility];  Service: ENT;  Laterality: Bilateral;   POLYPECTOMY     PROSTATE BIOPSY  10/10/2019   TOTAL HIP ARTHROPLASTY     Left hip replacement/femur fx 07/04, Screw removal  left hip- Hines 11/01    Family History  Problem Relation Age of Onset  Diabetes Mother    Diabetes Brother    Alzheimer's disease Maternal Aunt    Alzheimer's disease Cousin    Cancer Neg Hx    Colon cancer Neg Hx    Colon polyps Neg Hx    Rectal cancer Neg Hx    Stomach cancer Neg Hx    Esophageal cancer Neg Hx    Social History   Occupational History   Occupation: PC ADMINISTRATOR    Employer: [employer]  Tobacco Use   Smoking status: Never    Passive exposure: Past   Smokeless tobacco: Never  Vaping Use   Vaping status: Never Used  Substance and Sexual Activity   Alcohol  use: Yes    Alcohol /week: 1.0 standard drink of alcohol     Types: 1 Standard drinks or equivalent per week    Comment: occasional (DWI 1991)   Drug use: No   Sexual activity: Yes    Birth control/protection: None    Tobacco Counseling Counseling given: Not Answered   Immunizations and Health Maintenance Immunization History  Administered Date(s) Administered   Influenza Split 07/23/2013   Influenza, Seasonal, Injecte, Preservative Fre 06/24/2012, 06/16/2015, 06/06/2023   Influenza,inj,Quad PF,6+ Mos 05/12/2016, 09/10/2017, 08/27/2020   Moderna Sars-Covid-2 Vaccination 10/06/2019, 11/06/2019   PNEUMOCOCCAL CONJUGATE-20 12/20/2022   Pneumococcal Conjugate-13 05/12/2016   Pneumococcal Polysaccharide-23 11/08/2011   Td 12/02/1997, 10/26/2008, 02/14/2019   Zoster Recombinant(Shingrix ) 12/20/2022, 06/06/2023   Health Maintenance Due  Topic Date Due   FOOT EXAM  12/15/2022   Diabetic kidney evaluation - Urine ACR  12/20/2023    Activities of Daily Living    03/04/2024    4:13 PM 05/06/2023   10:56 AM  In your present state of health, do you have any difficulty performing the following activities:  Hearing? 0 0  Vision? 0 0  Difficulty concentrating or making decisions? 0 0  Walking or climbing stairs? 1   Comment currently injured   Dressing or bathing? 0   Doing errands, shopping? 0 0   Preparing Food and eating ? N   Using the Toilet? N   In the past six months, have you accidently leaked urine? N   Do you have problems with loss of bowel control? N   Managing your Medications? N   Managing your Finances? N   Housekeeping or managing your Housekeeping? N     Physical Exam   Physical Exam Constitutional:      Appearance: Normal appearance.  HENT:     Mouth/Throat:     Pharynx: No oropharyngeal exudate or posterior oropharyngeal erythema.  Eyes:     Conjunctiva/sclera: Conjunctivae normal.     Pupils: Pupils are equal, round, and reactive to light.  Cardiovascular:     Rate and Rhythm: Normal rate and regular rhythm.     Heart sounds: No murmur heard.    No gallop.     Comments: Faint pedal pulses Pulmonary:     Effort: Pulmonary effort is normal.     Breath sounds: Normal breath sounds. No wheezing or rales.  Abdominal:     Palpations: Abdomen is soft.     Tenderness: There is no abdominal tenderness.  Musculoskeletal:     Cervical back: Neck supple.     Right lower leg: No edema.     Left lower leg: No edema.  Lymphadenopathy:     Cervical: No cervical adenopathy.  Skin:    Findings: No lesion or rash.     Comments: Transmetatarsal amputation on right---chronic ulcer Left foot no  lesions---amputation of 5th toe  Neurological:     General: No focal deficit present.     Mental Status: He is alert and oriented to person, place, and time.     Comments: Almost no sensation in feet  Psychiatric:        Mood and Affect: Mood normal.        Behavior: Behavior normal.    (optional), or other factors deemed appropriate based on the beneficiary's medical and social history and current clinical standards.   Advanced Directives: Does Patient Have a Medical Advance Directive?: No Would patient like information on creating a medical advance directive?: Yes (MAU/Ambulatory/Procedural Areas - Information given)   EKG:  Last on file 08/25/22    Assessment:     This is a routine wellness  examination for this patient .   Vision/Hearing screen Hearing Screening  Method: Audiometry   500Hz  1000Hz  2000Hz  4000Hz   Right ear 20 20 20  0  Left ear 20 20 20  0   Vision Screening   Right eye Left eye Both eyes  Without correction 20/25 20/25 20/15   With correction        Goals   None      Depression Screen    03/04/2024    4:19 PM 12/04/2023    7:46 AM 12/20/2022    9:05 AM 12/14/2021    9:20 AM  PHQ 2/9 Scores  PHQ - 2 Score 0 0 1 0     Fall Risk    03/04/2024    4:17 PM  Fall Risk   Falls in the past year? 1  Number falls in past yr: 0  Injury with Fall? 1  Risk for fall due to : History of fall(s)  Follow up Falls evaluation completed    Cognitive Function        03/04/2024    4:19 PM  6CIT Screen  What Year? 0 points  What month? 0 points  What time? 0 points  Count back from 20 0 points  Months in reverse 0 points  Repeat phrase 2 points  Total Score 2 points    Patient Care Team: Jimmy Charlie FERNS, MD as PCP - General Rennie Cindy SAUNDERS, MD as Consulting Physician (Oncology)     Plan:      I have personally reviewed and noted the following in the patient's chart:   Medical and social history Use of alcohol , tobacco or illicit drugs  Current medications and supplements including opioid prescriptions. Patient is currently taking opioid prescriptions. Information provided to patient regarding non-opioid alternatives. Patient advised to discuss non-opioid treatment plan with their provider. Functional ability and status Nutritional status Physical activity Advanced directives List of other physicians Hospitalizations, surgeries, and ER visits in previous 12 months Vitals Screenings to include cognitive, depression, and falls Referrals and appointments  In addition, I have reviewed and discussed with patient certain preventive protocols, quality metrics, and best practice recommendations. A written  personalized care plan for preventive services as well as general preventive health recommendations were provided to patient.     Clotilda SHAUNNA Pander, CMA 03/05/2024

## 2024-03-05 NOTE — Telephone Encounter (Signed)
 Okay with me. Thanks

## 2024-03-05 NOTE — Assessment & Plan Note (Signed)
 I have personally reviewed the Medicare Annual Wellness questionnaire and have noted 1. The patient's medical and social history 2. Their use of alcohol , tobacco or illicit drugs 3. Their current medications and supplements 4. The patient's functional ability including ADL's, fall risks, home safety risks and hearing or visual             impairment. 5. Diet and physical activities 6. Evidence for depression or mood disorders  The patients weight, height, BMI and visual acuity have been recorded in the chart I have made referrals, counseling and provided education to the patient based review of the above and I have provided the pt with a written personalized care plan for preventive services.  I have provided you with a copy of your personalized plan for preventive services. Please take the time to review along with your updated medication list.  Colonoscopy due 2023 Recent PSA normal Can't exercise now Due for flu/RSV in the fall---prefers no COVID

## 2024-03-05 NOTE — Assessment & Plan Note (Signed)
 BP Readings from Last 3 Encounters:  03/05/24 118/68  12/04/23 136/68  10/15/23 118/66   Controlled on the amlodipine  5, losartan  25

## 2024-03-05 NOTE — Assessment & Plan Note (Signed)
 Ongoing ulcer --sees podiatrist

## 2024-03-05 NOTE — Assessment & Plan Note (Signed)
 5th toe Well healed

## 2024-03-05 NOTE — Telephone Encounter (Signed)
 Pt has requested to transfer his care to Dr. Cleatus, since Dr. Jimmy is retiring? Per Dr. Jimmy, pt needs a 6 month f/u in Feb 2026. Is this TOC okay? Call back # [phone]

## 2024-03-05 NOTE — Assessment & Plan Note (Signed)
 Hopefully control better on the farxiga  5 Metformin  1000 bid, lantus  22-24 daily  Severe neuropathy on alpha lipoic acid, duloxetine 

## 2024-03-06 ENCOUNTER — Ambulatory Visit: Payer: Self-pay | Admitting: Internal Medicine

## 2024-03-06 DIAGNOSIS — E11610 Type 2 diabetes mellitus with diabetic neuropathic arthropathy: Secondary | ICD-10-CM | POA: Diagnosis not present

## 2024-03-06 DIAGNOSIS — L97512 Non-pressure chronic ulcer of other part of right foot with fat layer exposed: Secondary | ICD-10-CM | POA: Diagnosis not present

## 2024-03-06 DIAGNOSIS — B351 Tinea unguium: Secondary | ICD-10-CM | POA: Diagnosis not present

## 2024-03-10 DIAGNOSIS — M25562 Pain in left knee: Secondary | ICD-10-CM | POA: Diagnosis not present

## 2024-03-10 DIAGNOSIS — M25462 Effusion, left knee: Secondary | ICD-10-CM | POA: Diagnosis not present

## 2024-03-10 DIAGNOSIS — S72112D Displaced fracture of greater trochanter of left femur, subsequent encounter for closed fracture with routine healing: Secondary | ICD-10-CM | POA: Diagnosis not present

## 2024-03-10 DIAGNOSIS — M9702XD Periprosthetic fracture around internal prosthetic left hip joint, subsequent encounter: Secondary | ICD-10-CM | POA: Diagnosis not present

## 2024-03-17 MED ORDER — TIRZEPATIDE 2.5 MG/0.5ML ~~LOC~~ SOAJ: 2.5000 mg | SUBCUTANEOUS | 1 refills | Status: DC

## 2024-03-20 DIAGNOSIS — Z89411 Acquired absence of right great toe: Secondary | ICD-10-CM | POA: Diagnosis not present

## 2024-03-20 DIAGNOSIS — Z794 Long term (current) use of insulin: Secondary | ICD-10-CM | POA: Diagnosis not present

## 2024-03-31 DIAGNOSIS — M9702XD Periprosthetic fracture around internal prosthetic left hip joint, subsequent encounter: Secondary | ICD-10-CM | POA: Diagnosis not present

## 2024-04-01 DIAGNOSIS — M79671 Pain in right foot: Secondary | ICD-10-CM | POA: Diagnosis not present

## 2024-04-01 DIAGNOSIS — L97512 Non-pressure chronic ulcer of other part of right foot with fat layer exposed: Secondary | ICD-10-CM | POA: Diagnosis not present

## 2024-04-01 DIAGNOSIS — Z89431 Acquired absence of right foot: Secondary | ICD-10-CM | POA: Diagnosis not present

## 2024-04-01 DIAGNOSIS — E11610 Type 2 diabetes mellitus with diabetic neuropathic arthropathy: Secondary | ICD-10-CM | POA: Diagnosis not present

## 2024-04-04 DIAGNOSIS — K080 Exfoliation of teeth due to systemic causes: Secondary | ICD-10-CM | POA: Diagnosis not present

## 2024-04-08 DIAGNOSIS — L97512 Non-pressure chronic ulcer of other part of right foot with fat layer exposed: Secondary | ICD-10-CM | POA: Diagnosis not present

## 2024-04-08 DIAGNOSIS — Z89431 Acquired absence of right foot: Secondary | ICD-10-CM | POA: Diagnosis not present

## 2024-04-08 DIAGNOSIS — L97511 Non-pressure chronic ulcer of other part of right foot limited to breakdown of skin: Secondary | ICD-10-CM | POA: Diagnosis not present

## 2024-04-14 ENCOUNTER — Other Ambulatory Visit: Payer: Self-pay

## 2024-04-14 MED ORDER — FINASTERIDE 5 MG PO TABS: 5.0000 mg | ORAL_TABLET | Freq: Every day | ORAL | 0 refills | Status: DC

## 2024-04-15 ENCOUNTER — Other Ambulatory Visit: Payer: Self-pay

## 2024-04-15 DIAGNOSIS — N411 Chronic prostatitis: Secondary | ICD-10-CM

## 2024-04-15 MED ORDER — TAMSULOSIN HCL 0.4 MG PO CAPS: 0.4000 mg | ORAL_CAPSULE | Freq: Every day | ORAL | 0 refills | Status: DC

## 2024-04-16 ENCOUNTER — Inpatient Hospital Stay: Admitting: Internal Medicine

## 2024-04-18 DIAGNOSIS — M1711 Unilateral primary osteoarthritis, right knee: Secondary | ICD-10-CM | POA: Diagnosis not present

## 2024-04-21 ENCOUNTER — Other Ambulatory Visit: Payer: Self-pay | Admitting: Family Medicine

## 2024-04-21 DIAGNOSIS — D649 Anemia, unspecified: Secondary | ICD-10-CM | POA: Diagnosis not present

## 2024-04-22 ENCOUNTER — Encounter: Payer: Self-pay | Admitting: Internal Medicine

## 2024-04-22 DIAGNOSIS — Z89431 Acquired absence of right foot: Secondary | ICD-10-CM | POA: Diagnosis not present

## 2024-04-22 DIAGNOSIS — L97512 Non-pressure chronic ulcer of other part of right foot with fat layer exposed: Secondary | ICD-10-CM | POA: Diagnosis not present

## 2024-04-22 DIAGNOSIS — E11610 Type 2 diabetes mellitus with diabetic neuropathic arthropathy: Secondary | ICD-10-CM | POA: Diagnosis not present

## 2024-04-24 ENCOUNTER — Encounter: Payer: Self-pay | Admitting: Family Medicine

## 2024-04-24 ENCOUNTER — Other Ambulatory Visit: Payer: Self-pay

## 2024-04-24 MED ORDER — SIMVASTATIN 40 MG PO TABS: 40.0000 mg | ORAL_TABLET | Freq: Every day | ORAL | 0 refills | Status: DC

## 2024-04-25 NOTE — Telephone Encounter (Signed)
 Are you able to increase the mounjaro 

## 2024-04-27 ENCOUNTER — Other Ambulatory Visit: Payer: Self-pay | Admitting: Family Medicine

## 2024-04-27 MED ORDER — TIRZEPATIDE 5 MG/0.5ML ~~LOC~~ SOAJ: 5.0000 mg | SUBCUTANEOUS | 5 refills | Status: AC

## 2024-04-28 ENCOUNTER — Telehealth: Payer: Self-pay | Admitting: Nurse Practitioner

## 2024-04-28 NOTE — Telephone Encounter (Signed)
 Pt spouse called and stated pt is tired and wants to be seen sooner than next week.   There was an opening tomorrow morning, pt and pt spouse accepted appt for tomorrow. They also stated they did have their [facility] labs done.

## 2024-04-29 ENCOUNTER — Other Ambulatory Visit: Payer: Self-pay

## 2024-04-29 ENCOUNTER — Inpatient Hospital Stay: Attending: Nurse Practitioner | Admitting: Nurse Practitioner

## 2024-04-29 ENCOUNTER — Encounter: Payer: Self-pay | Admitting: Nurse Practitioner

## 2024-04-29 VITALS — BP 136/69 | HR 79 | Temp 97.3°F | Resp 17 | Wt 232.0 lb

## 2024-04-29 DIAGNOSIS — Z860101 Personal history of adenomatous and serrated colon polyps: Secondary | ICD-10-CM | POA: Diagnosis not present

## 2024-04-29 DIAGNOSIS — L97519 Non-pressure chronic ulcer of other part of right foot with unspecified severity: Secondary | ICD-10-CM | POA: Insufficient documentation

## 2024-04-29 DIAGNOSIS — Z9841 Cataract extraction status, right eye: Secondary | ICD-10-CM | POA: Diagnosis not present

## 2024-04-29 DIAGNOSIS — Z886 Allergy status to analgesic agent status: Secondary | ICD-10-CM | POA: Diagnosis not present

## 2024-04-29 DIAGNOSIS — Z882 Allergy status to sulfonamides status: Secondary | ICD-10-CM | POA: Insufficient documentation

## 2024-04-29 DIAGNOSIS — D509 Iron deficiency anemia, unspecified: Secondary | ICD-10-CM | POA: Diagnosis not present

## 2024-04-29 DIAGNOSIS — L97529 Non-pressure chronic ulcer of other part of left foot with unspecified severity: Secondary | ICD-10-CM | POA: Diagnosis not present

## 2024-04-29 DIAGNOSIS — D638 Anemia in other chronic diseases classified elsewhere: Secondary | ICD-10-CM | POA: Insufficient documentation

## 2024-04-29 DIAGNOSIS — Z888 Allergy status to other drugs, medicaments and biological substances status: Secondary | ICD-10-CM | POA: Diagnosis not present

## 2024-04-29 DIAGNOSIS — Z818 Family history of other mental and behavioral disorders: Secondary | ICD-10-CM | POA: Insufficient documentation

## 2024-04-29 DIAGNOSIS — E1140 Type 2 diabetes mellitus with diabetic neuropathy, unspecified: Secondary | ICD-10-CM | POA: Diagnosis not present

## 2024-04-29 DIAGNOSIS — D75839 Thrombocytosis, unspecified: Secondary | ICD-10-CM | POA: Diagnosis not present

## 2024-04-29 DIAGNOSIS — I10 Essential (primary) hypertension: Secondary | ICD-10-CM | POA: Diagnosis not present

## 2024-04-29 DIAGNOSIS — Z79899 Other long term (current) drug therapy: Secondary | ICD-10-CM | POA: Insufficient documentation

## 2024-04-29 DIAGNOSIS — K5900 Constipation, unspecified: Secondary | ICD-10-CM | POA: Insufficient documentation

## 2024-04-29 DIAGNOSIS — E785 Hyperlipidemia, unspecified: Secondary | ICD-10-CM | POA: Insufficient documentation

## 2024-04-29 DIAGNOSIS — Z89422 Acquired absence of other left toe(s): Secondary | ICD-10-CM | POA: Diagnosis not present

## 2024-04-29 DIAGNOSIS — D649 Anemia, unspecified: Secondary | ICD-10-CM

## 2024-04-29 DIAGNOSIS — Z833 Family history of diabetes mellitus: Secondary | ICD-10-CM | POA: Insufficient documentation

## 2024-04-29 DIAGNOSIS — R5383 Other fatigue: Secondary | ICD-10-CM | POA: Insufficient documentation

## 2024-04-29 DIAGNOSIS — E11621 Type 2 diabetes mellitus with foot ulcer: Secondary | ICD-10-CM | POA: Insufficient documentation

## 2024-04-29 DIAGNOSIS — Z9842 Cataract extraction status, left eye: Secondary | ICD-10-CM | POA: Diagnosis not present

## 2024-04-29 DIAGNOSIS — D519 Vitamin B12 deficiency anemia, unspecified: Secondary | ICD-10-CM

## 2024-04-29 DIAGNOSIS — L409 Psoriasis, unspecified: Secondary | ICD-10-CM | POA: Diagnosis not present

## 2024-04-29 MED ORDER — FUROSEMIDE 20 MG PO TABS: 20.0000 mg | ORAL_TABLET | Freq: Every day | ORAL | 0 refills | Status: DC | PRN

## 2024-04-29 NOTE — Progress Notes (Signed)
 [facility] CONSULT NOTE  Patient Care Team: Jimmy Charlie FERNS, MD as PCP - General Rennie Cindy SAUNDERS, MD as Consulting Physician (Oncology)  CHIEF COMPLAINTS/PURPOSE OF CONSULTATION: ANEMIA  HEMATOLOGY HISTORY  # SEP 2023- ANEMIA MCV-platelets WBC-WNL; Iron sat; ferritin;  GFR- CT/US ; EGD- in JAn 2024 /colonoscopy-dec 21st, 2023; prior 3-4 year [Dr.[facility]]- DEC 2023-  Iron studies; ferritin no evidence of iron deficiency; myeloma panel kappa lambda light chain; hepatitis screening; LDH haptoglobin; CRP-within normal limit.  Peripheral smear no evidence of schistocytes. APRIl 2024- B12 141. Started B12 injections; OCThe bone marrow is generally normocellular for age with trilineage  hematopoiesis.  There are mild nonspecific changes present, likely  secondary in nature.  Correlation with cytogenetic studies is  recommended.   Latest Reference Range & Units Most Recent 12/14/21 10:08 03/29/22 13:12 06/19/22 15:18 06/28/22 11:16  Hemoglobin 13.0 - 17.7 g/dL 87.9 (L) 87/3/76 88:83 13.4 12.2 (L) 11.9 (L) 12.0 (L)  (L): Data is abnormally low  Latest Reference Range & Units 06/28/22 11:16  Iron 38 - 169 ug/dL 98  UIBC 888 - 656 ug/dL 785  TIBC 749 - 549 ug/dL 687  Ferritin 30 - 599 ng/mL 143  Iron Saturation 15 - 55 % 31   # Psoriasis [Dx- 18 years]- 2023- started TNF   HISTORY OF PRESENTING ILLNESS: alone. In a boot  [name] 68 y.o. male pleasant patient with mild anemia likley secondary chronic disease/infection- follow-up-] is here for a follow up.   He complains of chronic foot wound and is followed by podiatry. Feels tired all the time. Weight stable. Denies black or bloody stools. No hematuria. Not taking oral iron. Stopped oral b12 and injections.   Review of Systems  Constitutional:  Positive for malaise/fatigue. Negative for chills, diaphoresis, fever and weight loss.  Respiratory:  Negative for cough, hemoptysis, sputum production, shortness of  breath and wheezing.   Cardiovascular:  Negative for chest pain, palpitations, orthopnea and leg swelling.  Gastrointestinal:  Negative for abdominal pain, blood in stool, constipation, diarrhea, heartburn, melena, nausea and vomiting.  Genitourinary:  Negative for dysuria, frequency and urgency.  Musculoskeletal:  Negative for back pain and joint pain.  Skin: Negative.  Negative for itching and rash.  Neurological:  Negative for dizziness, tingling, focal weakness, weakness and headaches.  Endo/Heme/Allergies:  Does not bruise/bleed easily.  Psychiatric/Behavioral:  Negative for depression. The patient is not nervous/anxious and does not have insomnia.    MEDICAL HISTORY:  Past Medical History:  Diagnosis Date   Adenomatous polyp    Allergy     Anemia    Arthritis    feet    Bunion 04/02/2013   STATUS POST OP BUN REPAIR   Diabetes mellitus    Foot ulcer (HCC)    GERD (gastroesophageal reflux disease)    Gout    Hammertoe    Hyperlipidemia    Hypertension    Neuromuscular disorder (HCC)    neuropathy   Plantar flexed metatarsal    Psoriasis    Schatzki's ring     SURGICAL HISTORY: Past Surgical History:  Procedure Laterality Date   AMPUTATION TOE Left 05/08/2023   Procedure: AMPUTATION 5TH TOE WITH PARTIAL RAY RESECTION;  Surgeon: Neill Boas, DPM;  Location: [facility];  Service: Orthopedics/Podiatry;  Laterality: Left;   CARPAL TUNNEL RELEASE Left 08/24/2022   Procedure: CARPAL TUNNEL RELEASE;  Surgeon: Cleotilde Barrio, MD;  Location: [facility];  Service: Orthopedics;  Laterality: Left;   CATARACT EXTRACTION W/PHACO Left  03/21/2023   Procedure: CATARACT EXTRACTION PHACO AND INTRAOCULAR LENS PLACEMENT (IOC) LEFT MALYUGIN DIABETIC 9.88 1:01.4;  Surgeon: Mittie Gaskin, MD;  Location: [facility];  Service: Ophthalmology;  Laterality: Left;   CATARACT EXTRACTION W/PHACO Right 04/04/2023   Procedure: CATARACT EXTRACTION PHACO AND INTRAOCULAR LENS PLACEMENT (IOC) RIGHT  MALYUGIN DIABETIC 15.31 01:15.1;  Surgeon: Mittie Gaskin, MD;  Location: [facility];  Service: Ophthalmology;  Laterality: Right;   COLONOSCOPY     FOOT AMPUTATION Right 01/11/2017   Dr Tisa ([facility])--transmetatarsal   FOOT SURGERY Right 02/28/2013   ALLEEN HAM TOE2,3 PINS ,2ND MET OSTEOTOMY, 5TH MET W/SCREW   [phone], 04-2013   HIP FRACTURE SURGERY  1993   surgery x2   IR BONE MARROW BIOPSY & ASPIRATION  04/27/2023   LOWER EXTREMITY ANGIOGRAPHY Left 05/07/2023   Procedure: Lower Extremity Angiography;  Surgeon: Marea Selinda RAMAN, MD;  Location: [facility];  Service: Cardiovascular;  Laterality: Left;   NASAL SEPTOPLASTY W/ TURBINOPLASTY Bilateral 02/08/2023   Procedure: NASAL SEPTOPLASTY WITH TURBINATE REDUCTION;  Surgeon: Edda Mt, MD;  Location: [facility];  Service: ENT;  Laterality: Bilateral;   POLYPECTOMY     PROSTATE BIOPSY  10/10/2019   TOTAL HIP ARTHROPLASTY     Left hip replacement/femur fx 07/04, Screw removal left hip- Hines 11/01    SOCIAL HISTORY: Social History   Socioeconomic History   Marital status: Married    Spouse name: Not on file   Number of children: 3   Years of education: Not on file   Highest education level: 12th grade  Occupational History   Occupation: PC ADMINISTRATOR    Employer: [employer]    Comment: Retired 3/25  Tobacco Use   Smoking status: Never    Passive exposure: Past   Smokeless tobacco: Never  Vaping Use   Vaping status: Never Used  Substance and Sexual Activity   Alcohol  use: Yes    Alcohol /week: 1.0 standard drink of alcohol     Types: 1 Standard drinks or equivalent per week    Comment: occasional (DWI 1991)   Drug use: No   Sexual activity: Yes    Birth control/protection: None  Other Topics Concern   Not on file  Social History Narrative   alcohol  2-3 times a week; never smoked; lives in [place]. With wife; and 2 dog.        No living will   Wife would be health care POA---no clear  alternate   Would accept resuscitation   No feeding tube if cognitively unaware   Social Drivers of Health   Financial Resource Strain: Low Risk  (04/22/2024)   Received from [facility]   Overall Financial Resource Strain (CARDIA)    Difficulty of Paying Living Expenses: Not hard at all  Food Insecurity: No Food Insecurity (04/22/2024)   Received from [facility]   Hunger Vital Sign    Within the past 12 months, you worried that your food would run out before you got the money to buy more.: Never true    Within the past 12 months, the food you bought just didn't last and you didn't have money to get more.: Never true  Transportation Needs: No Transportation Needs (04/22/2024)   Received from [facility] - Transportation    In the past 12 months, has lack of transportation kept you from medical appointments or from getting medications?: No    Lack of Transportation (Non-Medical): No  Physical  Activity: Inactive (03/03/2024)   Exercise Vital Sign    Days of Exercise per Week: 0 days    Minutes of Exercise per Session: Not on file  Stress: No Stress Concern Present (03/03/2024)   [employer] of Occupational Health - Occupational Stress Questionnaire    Feeling of Stress: Only a little  Social Connections: Unknown (03/03/2024)   Social Connection and Isolation Panel    Frequency of Communication with Friends and Family: Twice a week    Frequency of Social Gatherings with Friends and Family: Patient declined    Attends Religious Services: More than 4 times per year    Active Member of [employer] or Organizations: No    Attends Engineer, structural: Not on file    Marital Status: Married  Catering manager Violence: Not At Risk (03/04/2024)   Humiliation, Afraid, Rape, and Kick questionnaire    Fear of Current or Ex-Partner: No    Emotionally Abused: No    Physically Abused: No    Sexually Abused: No    FAMILY  HISTORY: Family History  Problem Relation Age of Onset   Diabetes Mother    Diabetes Brother    Alzheimer's disease Maternal Aunt    Alzheimer's disease Cousin    Cancer Neg Hx    Colon cancer Neg Hx    Colon polyps Neg Hx    Rectal cancer Neg Hx    Stomach cancer Neg Hx    Esophageal cancer Neg Hx     ALLERGIES:  is allergic to glipizide, lisinopril , ramipril, ibuprofen, and bactrim  [sulfamethoxazole -trimethoprim ].  MEDICATIONS:  Current Outpatient Medications  Medication Sig Dispense Refill   Alcohol  Swabs  (ALCOHOL  PREP) 70 % PADS USE 1 PREP PAD TWICE DAILY 200 each 3   Alpha-Lipoic Acid 600 MG TABS Take by mouth. 3 in am and 1 in pm     amLODipine  (NORVASC ) 5 MG tablet TAKE 1 TABLET(5 MG) BY MOUTH DAILY 90 tablet 3   CALCIUM-MAGNESIUM -ZINC  PO Take by mouth.     Cinnamon 500 MG capsule Take 500 mg by mouth 2 (two) times daily.     clobetasol ointment (TEMOVATE) 0.05 % Apply topically.     CONTOUR NEXT TEST test strip USE 1 TO 2 TIMES DAILY TO CHECK  BLOOD SUGAR 200 strip 3   cyanocobalamin  (VITAMIN B12) 1000 MCG/ML injection 1 ml injection-once a month. 12 mL 1   dapagliflozin  propanediol (FARXIGA ) 5 MG TABS tablet Take 1 tablet (5 mg total) by mouth daily. 90 tablet 3   DULoxetine  (CYMBALTA ) 60 MG capsule Take 2 capsules (120 mg total) by mouth daily. 180 capsule 3   finasteride  (PROSCAR ) 5 MG tablet Take 1 tablet (5 mg total) by mouth daily. 90 tablet 0   furosemide  (LASIX ) 20 MG tablet Take 1 tablet (20 mg total) by mouth daily as needed. 90 tablet 0   gabapentin  (NEURONTIN ) 400 MG capsule Take 2 capsules (800 mg total) by mouth 3 (three) times daily. 540 capsule 3   hydrocortisone  valerate ointment (WEST-CORT) 0.2 % APPLY TOPICALLY TO AFFECTED  AREA(S) TWICE DAILY 120 g 1   hydrOXYzine  (VISTARIL ) 25 MG capsule TAKE 1 CAPSULE(25 MG) BY MOUTH EVERY 8 HOURS AS NEEDED FOR ITCHING 60 capsule 3   insulin  glargine (LANTUS  SOLOSTAR) 100 UNIT/ML Solostar Pen Inject 24 Units into the  skin daily. 1 mL 3   Insulin  Pen Needle (B-D UF III MINI PEN NEEDLES) 31G X 5 MM MISC USE AS DIRECTED DAILY 90 each 3   losartan  (COZAAR )  25 MG tablet TAKE 1 TABLET(25 MG) BY MOUTH DAILY 90 tablet 3   metFORMIN  (GLUCOPHAGE ) 1000 MG tablet Take 1 tablet (1,000 mg total) by mouth 2 (two) times daily with a meal. 180 tablet 3   Multiple Vitamin (MULTIVITAMIN WITH MINERALS) TABS tablet Take 1 tablet by mouth daily. 30 tablet 2   omeprazole  (PRILOSEC) 20 MG capsule Take 1 capsule (20 mg total) by mouth 2 (two) times daily before a meal. TAKE 1 CAPSULE BY MOUTH TWICE  DAILY BEFORE MEALS 180 capsule 3   Oxycodone  HCl 10 MG TABS Take 1 tablet by mouth 5 (five) times daily.     simvastatin  (ZOCOR ) 40 MG tablet Take 1 tablet (40 mg total) by mouth at bedtime. 90 tablet 0   SYRINGE-NEEDLE, DISP, 3 ML (BD SAFETYGLIDE SYRINGE/NEEDLE) 25G X 1 3 ML MISC Use the needle for IM injection once a  month; or as directed. 30 each 1   tamsulosin  (FLOMAX ) 0.4 MG CAPS capsule Take 1 capsule (0.4 mg total) by mouth daily. 90 capsule 0   tirzepatide  (MOUNJARO ) 5 MG/0.5ML Pen Inject 5 mg into the skin once a week. 2 mL 5   triamcinolone  lotion (KENALOG ) 0.1 % APPLY TO AFFECTED AREA(S)  TOPICALLY 3 TIMES DAILY 180 mL 1   No current facility-administered medications for this visit.     [name]  PHYSICAL EXAMINATION: Vitals:   04/29/24 0944  BP: 136/69  Pulse: 79  Resp: 17  Temp: (!) 97.3 F (36.3 C)  SpO2: 99%   Filed Weights   04/29/24 0944  Weight: 232 lb (105.2 kg)   Physical Exam Vitals reviewed.  Constitutional:      Appearance: He is not ill-appearing.  HENT:     Head: Normocephalic and atraumatic.  Eyes:     Extraocular Movements: Extraocular movements intact.     Pupils: Pupils are equal, round, and reactive to light.  Pulmonary:     Effort: No respiratory distress.     Breath sounds: No wheezing.  Abdominal:     General: There is no distension.  Musculoskeletal:        General: No deformity.   Skin:    General: Skin is warm.     Coloration: Skin is not pale.  Neurological:     Mental Status: He is alert and oriented to person, place, and time.  Psychiatric:        Mood and Affect: Mood normal.        Behavior: Behavior normal.     LABORATORY DATA:  I have reviewed the data as listed    No results found.  ASSESSMENT & PLAN:   Symptomatic anemia # Anemia- mild Hb 12; normocytic [since MAY 2023]-DEC 2023-  Iron studies; ferritin no evidence of iron deficiency; myeloma panel kappa lambda light chain; hepatitis screening; LDH haptoglobin; CRP-within normal limit.  Peripheral smear no evidence of schistocytes.  OCT 2024- Bone marrow Biopsy: Variably cellular bone marrow with trilineage hematopoiesis; The bone marrow is generally normocellular for age with trilineage  hematopoiesis.  There are mild nonspecific changes present, likely  secondary in nature.  Correlation with cytogenetic studies is  recommended. US  2024- OCT 2024-  Increased hepatic parenchymal echogenicity suggestive of steatosis.  No cholelithiasis or sonographic evidence for acute cholecystitis.   # Anemia currently 10.5. Decreased from previous. Ferritin 63, iron sat 13%. TIBC 285. Ok to trial gentle iron 3 times a week. Discussed no role for transfusion or iv iron and his chronic anemia is not likely  causing significant fatigue. Discussed likely related to chronic diease and inflammation/foot wound/toe infection.   # Thrombocytosis- Plt 452. Likely supportive of reactive etiology. Similar to August 2025 results. CRP is not elevated.    # DM- Bil Foot ulcer- [Dr.Kline, podiatry];  recommended he discuss option of seeing wound care with MD.    # B12 deficiency- 141 [March 2024-; [facility]]- Recommend STOPPING  monthly B 12 injections- ; MARCH 2025- -B 12 > 2000. Repeat B12 level remains > 2000. He has stopped oral b12. Continue to hold.    # Psoriasis- on TNF blocker-TALTZ  [Dr.Kowalski]-stable/improved.   #  Diabetic DM [A1c 5.9]- on insulin - see above.     # DISPOSITION: # follow up in 6  months- see Dr Rennie. 1 week prior - labs at [facility]; provided with form today- la  No problem-specific Assessment & Plan notes found for this encounter.   All questions were answered. The patient knows to call the clinic with any problems, questions or concerns.  Tinnie KANDICE Dawn, NP 04/29/2024

## 2024-04-29 NOTE — Progress Notes (Signed)
 Patient here for oncology follow-up appointment, concerns of constipation, fatigue, neuropathy and right foot ulcer

## 2024-05-01 ENCOUNTER — Ambulatory Visit: Payer: Self-pay | Admitting: Internal Medicine

## 2024-05-01 NOTE — Telephone Encounter (Signed)
 FYI Only or Action Required?: FYI only for provider.  Patient was last seen in primary care on 03/05/2024 by Jimmy Charlie FERNS, MD.  Called Nurse Triage reporting Abdominal Pain.  Symptoms began several weeks ago.  Triage Disposition: See Physician Within 24 Hours  Patient/caregiver understands and will follow disposition?: Yes              Copied from CRM [id number]. Topic: Clinical - Red Word Triage >> May 01, 2024  4:37 PM Shanda MATSU wrote: Red Word that prompted transfer to Nurse Triage: Patient's wife, RUBY ATKINS, is reporting patient has been having stomach pains for the past 2 weeks. Reason for Disposition  [1] MODERATE pain (e.g., interferes with normal activities) AND [2] pain comes and goes (cramps) AND [3] present > 24 hours  (Exception: Pain with Vomiting or Diarrhea - see that Guideline.)  Answer Assessment - Initial Assessment Questions This RN spoke with pt's wife, Ruby Middle of stomach pain with bloating for 2 weeks; intermittent Denies constipation (normal bowel movements) Denies changes in diet or medications  RADIATION: Does the pain shoot anywhere else? (e.g., chest, back)     Denies  SEVERITY: How bad is the pain?  (e.g., Scale 1-10; mild, moderate, or severe)     When bad about a 7/10 pain level  RECURRENT SYMPTOM: Have you ever had this type of stomach pain before? If Yes, ask: When was the last time? and What happened that time?      No  CAUSE: What do you think is causing the stomach pain? (e.g., gallstones, recent abdominal surgery)     No idea  OTHER SYMPTOMS: Do you have any other symptoms? (e.g., back pain, diarrhea, fever, urination pain, vomiting)       Intermittent hot/cold, denies fever  Protocols used: Abdominal Pain - Male-A-AH

## 2024-05-01 NOTE — Telephone Encounter (Signed)
 Appointment with Carrol 05/02/2024

## 2024-05-02 ENCOUNTER — Encounter: Payer: Self-pay | Admitting: General Practice

## 2024-05-02 ENCOUNTER — Other Ambulatory Visit: Payer: Self-pay | Admitting: General Practice

## 2024-05-02 ENCOUNTER — Ambulatory Visit: Payer: Self-pay | Admitting: General Practice

## 2024-05-02 ENCOUNTER — Ambulatory Visit: Admitting: General Practice

## 2024-05-02 ENCOUNTER — Ambulatory Visit
Admission: RE | Admit: 2024-05-02 | Discharge: 2024-05-02 | Disposition: A | Discharge status: Home / Self Care | Source: Ambulatory Visit | Attending: General Practice | Admitting: General Practice

## 2024-05-02 VITALS — BP 116/50 | HR 75 | Temp 97.9°F | Ht 72.5 in | Wt 236.0 lb

## 2024-05-02 DIAGNOSIS — R1010 Upper abdominal pain, unspecified: Secondary | ICD-10-CM

## 2024-05-02 DIAGNOSIS — K219 Gastro-esophageal reflux disease without esophagitis: Secondary | ICD-10-CM | POA: Diagnosis not present

## 2024-05-02 DIAGNOSIS — K5909 Other constipation: Secondary | ICD-10-CM | POA: Diagnosis not present

## 2024-05-02 DIAGNOSIS — I959 Hypotension, unspecified: Secondary | ICD-10-CM | POA: Diagnosis not present

## 2024-05-02 DIAGNOSIS — D649 Anemia, unspecified: Secondary | ICD-10-CM

## 2024-05-02 DIAGNOSIS — R109 Unspecified abdominal pain: Secondary | ICD-10-CM | POA: Diagnosis not present

## 2024-05-02 LAB — CBC WITH DIFFERENTIAL/PLATELET
Basophils Absolute: 0 K/uL (ref 0.0–0.1)
Basophils Relative: 0.2 % (ref 0.0–3.0)
Eosinophils Absolute: 0.5 K/uL (ref 0.0–0.7)
Eosinophils Relative: 7.2 % — ABNORMAL HIGH (ref 0.0–5.0)
HCT: 30.1 % — ABNORMAL LOW (ref 39.0–52.0)
Hemoglobin: 9.9 g/dL — ABNORMAL LOW (ref 13.0–17.0)
Lymphocytes Relative: 14.6 % (ref 12.0–46.0)
Lymphs Abs: 1 K/uL (ref 0.7–4.0)
MCHC: 33 g/dL (ref 30.0–36.0)
MCV: 85.5 fl (ref 78.0–100.0)
Monocytes Absolute: 0.7 K/uL (ref 0.1–1.0)
Monocytes Relative: 10.8 % (ref 3.0–12.0)
Neutro Abs: 4.4 K/uL (ref 1.4–7.7)
Neutrophils Relative %: 67.2 % (ref 43.0–77.0)
Platelets: 376 K/uL (ref 150.0–400.0)
RBC: 3.52 Mil/uL — ABNORMAL LOW (ref 4.22–5.81)
RDW: 15 % (ref 11.5–15.5)
WBC: 6.6 K/uL (ref 4.0–10.5)

## 2024-05-02 LAB — LIPASE: Lipase: 5 U/L — ABNORMAL LOW (ref 11.0–59.0)

## 2024-05-02 LAB — BASIC METABOLIC PANEL WITH GFR
BUN: 31 mg/dL — ABNORMAL HIGH (ref 6–23)
CO2: 26 meq/L (ref 19–32)
Calcium: 8.7 mg/dL (ref 8.4–10.5)
Chloride: 91 meq/L — ABNORMAL LOW (ref 96–112)
Creatinine, Ser: 1.28 mg/dL (ref 0.40–1.50)
GFR: 57.5 mL/min — ABNORMAL LOW (ref >60.00)
Glucose, Bld: 127 mg/dL — ABNORMAL HIGH (ref 70–99)
Potassium: 5.2 meq/L — ABNORMAL HIGH (ref 3.5–5.1)
Sodium: 125 meq/L — ABNORMAL LOW (ref 135–145)

## 2024-05-02 LAB — TSH: TSH: 3.81 u[IU]/mL (ref 0.35–5.50)

## 2024-05-02 MED ORDER — PANTOPRAZOLE SODIUM 40 MG PO TBEC: 40.0000 mg | DELAYED_RELEASE_TABLET | Freq: Every day | ORAL | 0 refills | Status: DC

## 2024-05-02 NOTE — Progress Notes (Signed)
 Established Patient Office Visit  Subjective   Patient ID: BLAS RICHES, male    DOB: 12-Mar-1956  Age: 68 y.o. MRN: [id number]  Chief Complaint  Patient presents with   Abdominal Pain    Patient states pain is mainly in the upper abdomen but all over as well. Patients pain started 2-3 weeks ago off and on but has gotten worse. Patient has been constipated during this time as well. Patient has been taking stool softeners and Mylanta; he was able to go yesterday but states his stomach is still very tight and painful today.     Abdominal Pain Associated symptoms include constipation. Pertinent negatives include no diarrhea, dysuria, fever, frequency, headaches, nausea or vomiting.    Aven Christen is a 68 year old male, patient of Dr. Jimmy, presents today for an acute visit.    Discussed the use of AI scribe software for clinical note transcription with the patient, who gave verbal consent to proceed.  History of Present Illness Cartel Mauss is a 68 year old male with anemia and diabetes who presents with abdominal pain.  He has been experiencing intermittent abdominal pain primarily in the upper abdomen for the past two to three weeks. The pain has become more severe and impacts his ability to eat. He experiences constipation, for which he takes a stool softener nightly, and notes a change in his bowel movement frequency. He also reports passing gas and occasional blood in his stool, which he attributes to frequent wiping. He also takes oxycodone , which may contribute to his constipation, and a [employer] brand stool softener.A colonoscopy in 2023 showed diverticulosis and a small polyp.  He has a history of anemia and experiences extreme fatigue and tiredness, which he attributes to low hemoglobin levels. His last hemoglobin was 10.3. He takes an over-the-counter iron supplement daily but is unsure of the dosage. Iron infusions have not been offered as his ferritin levels are  adequate.  He has a history of diabetes, diagnosed at age 34. His most recent A1c was 8.1, improved from a previous high of 9.6. He experiences fatigue, which he believes could be related to his diabetes.  He takes omeprazole  80 mg twice daily for heartburn. He does not find if effective any more as he is still having heartburn all the time.    Patient Active Problem List   Diagnosis Date Noted   Other constipation 05/02/2024   Amputated toe of left foot 03/05/2024   Atherosclerosis of native arteries of the extremities with ulceration (HCC) 07/10/2023   Cold hands 06/06/2023   Diabetes mellitus treated with insulin  and oral medication (HCC) 06/06/2023   Alcohol  use 05/05/2023   Carpal tunnel syndrome of left wrist 01/05/2023   Carpal tunnel syndrome of right wrist 01/01/2023   Numbness of hand 01/01/2023   BPH (benign prostatic hyperplasia) 12/20/2022   B12 deficiency 10/31/2022   Allergic reaction to sulfonamide 06/19/2022   Symptomatic anemia 06/12/2022   Elevated PSA 02/14/2019   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 (HCC) 03/12/2018   Adenomatous polyp    Routine general medical examination at a health care facility 04/17/2011   Osteoarthritis 03/17/2007   DM (diabetes mellitus) type II controlled, neurological manifestation (HCC) 03/13/2007   Hyperlipemia 03/13/2007   GOUT 03/13/2007   Essential hypertension, benign 03/13/2007   GERD 03/13/2007   PSORIASIS 03/13/2007   Past Medical History:  Diagnosis Date   Adenomatous polyp    Allergy   Anemia    Arthritis    feet    Bunion 04/02/2013   STATUS POST OP BUN REPAIR   Diabetes mellitus    Foot ulcer (HCC)    GERD (gastroesophageal reflux disease)    Gout    Hammertoe    Hyperlipidemia    Hypertension    Neuromuscular disorder (HCC)    neuropathy   Plantar flexed metatarsal    Psoriasis    Schatzki's ring    Past Surgical History:  Procedure Laterality Date   AMPUTATION TOE Left  05/08/2023   Procedure: AMPUTATION 5TH TOE WITH PARTIAL RAY RESECTION;  Surgeon: Neill Boas, DPM;  Location: [facility];  Service: Orthopedics/Podiatry;  Laterality: Left;   CARPAL TUNNEL RELEASE Left 08/24/2022   Procedure: CARPAL TUNNEL RELEASE;  Surgeon: Cleotilde Barrio, MD;  Location: [facility];  Service: Orthopedics;  Laterality: Left;   CATARACT EXTRACTION W/PHACO Left 03/21/2023   Procedure: CATARACT EXTRACTION PHACO AND INTRAOCULAR LENS PLACEMENT (IOC) LEFT MALYUGIN DIABETIC 9.88 1:01.4;  Surgeon: Mittie Gaskin, MD;  Location: [facility];  Service: Ophthalmology;  Laterality: Left;   CATARACT EXTRACTION W/PHACO Right 04/04/2023   Procedure: CATARACT EXTRACTION PHACO AND INTRAOCULAR LENS PLACEMENT (IOC) RIGHT MALYUGIN DIABETIC 15.31 01:15.1;  Surgeon: Mittie Gaskin, MD;  Location: [facility];  Service: Ophthalmology;  Laterality: Right;   COLONOSCOPY     FOOT AMPUTATION Right 01/11/2017   Dr Tisa ([facility])--transmetatarsal   FOOT SURGERY Right 02/28/2013   ALLEEN HAM TOE2,3 PINS ,2ND MET OSTEOTOMY, 5TH MET W/SCREW   [phone], 04-2013   HIP FRACTURE SURGERY  1993   surgery x2   IR BONE MARROW BIOPSY & ASPIRATION  04/27/2023   LOWER EXTREMITY ANGIOGRAPHY Left 05/07/2023   Procedure: Lower Extremity Angiography;  Surgeon: Marea Selinda RAMAN, MD;  Location: [facility];  Service: Cardiovascular;  Laterality: Left;   NASAL SEPTOPLASTY W/ TURBINOPLASTY Bilateral 02/08/2023   Procedure: NASAL SEPTOPLASTY WITH TURBINATE REDUCTION;  Surgeon: Edda Mt, MD;  Location: [facility];  Service: ENT;  Laterality: Bilateral;   POLYPECTOMY     PROSTATE BIOPSY  10/10/2019   TOTAL HIP ARTHROPLASTY     Left hip replacement/femur fx 07/04, Screw removal left hip- [facility] 11/01   Allergies  Allergen Reactions   Glipizide Other (See Comments)    REACTION: wt. gain, hands tingling   Lisinopril  Other (See Comments)    HYPERKALEMIA   Ramipril Cough   Ibuprofen      UNSPECIFIED REACTION    Bactrim  [Sulfamethoxazole -Trimethoprim ] Nausea And Vomiting and Rash         05/02/2024   12:15 PM 04/29/2024    9:52 AM 03/04/2024    4:19 PM  Depression screen PHQ 2/9  Decreased Interest 3 0 0  Down, Depressed, Hopeless 0 0 0  PHQ - 2 Score 3 0 0  Altered sleeping 0    Tired, decreased energy 3    Change in appetite 1    Feeling bad or failure about yourself  0    Trouble concentrating 0    Moving slowly or fidgety/restless 0    Suicidal thoughts 0    PHQ-9 Score 7    Difficult doing work/chores Very difficult          No data to display            Review of Systems  Constitutional:  Negative for chills and fever.  Respiratory:  Negative for shortness of breath.   Cardiovascular:  Negative for chest pain.  Gastrointestinal:  Positive for abdominal pain, blood in stool and constipation. Negative for diarrhea, heartburn, nausea and vomiting.  Genitourinary:  Negative for dysuria, frequency and urgency.  Neurological:  Negative for dizziness and headaches.  Endo/Heme/Allergies:  Negative for polydipsia.  Psychiatric/Behavioral:  Negative for depression and suicidal ideas. The patient is not nervous/anxious.       Objective:     BP (!) 116/50   Pulse 75   Temp 97.9 F (36.6 C) (Oral)   Ht 6' 0.5 (1.842 m)   Wt 236 lb (107 kg)   SpO2 95%   BMI 31.57 kg/m  BP Readings from Last 3 Encounters:  05/02/24 (!) 116/50  04/29/24 136/69  03/05/24 118/68   Wt Readings from Last 3 Encounters:  05/02/24 236 lb (107 kg)  04/29/24 232 lb (105.2 kg)  03/05/24 230 lb (104.3 kg)      Physical Exam Vitals and nursing note reviewed.  Constitutional:      Appearance: Normal appearance. He is obese.  Cardiovascular:     Rate and Rhythm: Normal rate and regular rhythm.     Pulses: Normal pulses.     Heart sounds: Normal heart sounds.  Pulmonary:     Effort: Pulmonary effort is normal.     Breath sounds: Normal breath sounds.  Abdominal:      General: Bowel sounds are decreased.     Tenderness: There is abdominal tenderness in the epigastric area.  Neurological:     Mental Status: He is alert and oriented to person, place, and time.  Psychiatric:        Mood and Affect: Mood normal.        Behavior: Behavior normal.        Thought Content: Thought content normal.        Judgment: Judgment normal.      No results found for any visits on 05/02/24.     The ASCVD Risk score (Arnett DK, et al., 2019) failed to calculate for the following reasons:   The valid total cholesterol range is 130 to 320 mg/dL    Assessment & Plan:  Other constipation -     TSH  Gastroesophageal reflux disease, unspecified whether esophagitis present -     Pantoprazole Sodium; Take 1 tablet (40 mg total) by mouth daily.  Dispense: 30 tablet; Refill: 0  Pain of upper abdomen -     CBC with Differential/Platelet -     Basic metabolic panel with GFR -     DG Abd 2 Views -     Lipase  Hypotension, unspecified hypotension type  Anemia, unspecified type    Assessment and Plan Assessment & Plan Abdominal pain with constipation and diarrhea Intermittent upper abdominal pain with constipation and occasional diarrhea. Recent colonoscopy showed diverticulosis and a small polyp. - no red flags on exam.  - STAT abdominal x-ray pending.  - STAT CBC with diff, BMP, Lipase and TSH pending. - Advise increased water intake.  Gastroesophageal reflux disease refractory to omeprazole  Persistent heartburn despite high-dose omeprazole  suggests refractory GERD. - Prescribe pantoprazole 40 mg once daily.  - Follow up in 4 weeks.  Anemia, unspecified Chronic anemia with hemoglobin of 10.3. Ferritin levels adequate. Fatigue may be related to anemia. - Continue current iron supplementation.  Hypotension, unspecified Low blood pressure noted. - no red flags on exam. - BMP pending.   Return in about 4 weeks (around 05/30/2024) for GERD.    Carrol Aurora, NP

## 2024-05-02 NOTE — Patient Instructions (Signed)
 Stop by the lab prior to leaving today. I will notify you of your results once received.  Complete xray(s) prior to leaving today. I will notify you of your results once received.  Stop omeprazole .  Start Pantoprazole 40 mg once daily.   Follow up in 4 weeks.   I will be in touch regarding the labs and x-ray.   As discussed go to the ER if symptoms worsen or do not improve.   It was a pleasure meeting you!

## 2024-05-02 NOTE — Telephone Encounter (Signed)
 Noted

## 2024-05-05 ENCOUNTER — Telehealth: Payer: Self-pay | Admitting: *Deleted

## 2024-05-05 ENCOUNTER — Other Ambulatory Visit: Payer: Self-pay | Admitting: *Deleted

## 2024-05-05 ENCOUNTER — Inpatient Hospital Stay

## 2024-05-05 DIAGNOSIS — D75839 Thrombocytosis, unspecified: Secondary | ICD-10-CM | POA: Diagnosis not present

## 2024-05-05 DIAGNOSIS — E11621 Type 2 diabetes mellitus with foot ulcer: Secondary | ICD-10-CM | POA: Diagnosis not present

## 2024-05-05 DIAGNOSIS — Z833 Family history of diabetes mellitus: Secondary | ICD-10-CM | POA: Diagnosis not present

## 2024-05-05 DIAGNOSIS — D638 Anemia in other chronic diseases classified elsewhere: Secondary | ICD-10-CM | POA: Diagnosis not present

## 2024-05-05 DIAGNOSIS — Z886 Allergy status to analgesic agent status: Secondary | ICD-10-CM | POA: Diagnosis not present

## 2024-05-05 DIAGNOSIS — L97529 Non-pressure chronic ulcer of other part of left foot with unspecified severity: Secondary | ICD-10-CM | POA: Diagnosis not present

## 2024-05-05 DIAGNOSIS — Z9842 Cataract extraction status, left eye: Secondary | ICD-10-CM | POA: Diagnosis not present

## 2024-05-05 DIAGNOSIS — Z818 Family history of other mental and behavioral disorders: Secondary | ICD-10-CM | POA: Diagnosis not present

## 2024-05-05 DIAGNOSIS — Z9841 Cataract extraction status, right eye: Secondary | ICD-10-CM | POA: Diagnosis not present

## 2024-05-05 DIAGNOSIS — Z888 Allergy status to other drugs, medicaments and biological substances status: Secondary | ICD-10-CM | POA: Diagnosis not present

## 2024-05-05 DIAGNOSIS — E1140 Type 2 diabetes mellitus with diabetic neuropathy, unspecified: Secondary | ICD-10-CM | POA: Diagnosis not present

## 2024-05-05 DIAGNOSIS — Z860101 Personal history of adenomatous and serrated colon polyps: Secondary | ICD-10-CM | POA: Diagnosis not present

## 2024-05-05 DIAGNOSIS — Z79899 Other long term (current) drug therapy: Secondary | ICD-10-CM | POA: Diagnosis not present

## 2024-05-05 DIAGNOSIS — K5900 Constipation, unspecified: Secondary | ICD-10-CM | POA: Diagnosis not present

## 2024-05-05 DIAGNOSIS — E785 Hyperlipidemia, unspecified: Secondary | ICD-10-CM | POA: Diagnosis not present

## 2024-05-05 DIAGNOSIS — R5383 Other fatigue: Secondary | ICD-10-CM | POA: Diagnosis not present

## 2024-05-05 DIAGNOSIS — I10 Essential (primary) hypertension: Secondary | ICD-10-CM | POA: Diagnosis not present

## 2024-05-05 DIAGNOSIS — R531 Weakness: Secondary | ICD-10-CM

## 2024-05-05 DIAGNOSIS — Z882 Allergy status to sulfonamides status: Secondary | ICD-10-CM | POA: Diagnosis not present

## 2024-05-05 DIAGNOSIS — L97519 Non-pressure chronic ulcer of other part of right foot with unspecified severity: Secondary | ICD-10-CM | POA: Diagnosis not present

## 2024-05-05 DIAGNOSIS — D509 Iron deficiency anemia, unspecified: Secondary | ICD-10-CM | POA: Diagnosis not present

## 2024-05-05 DIAGNOSIS — Z89422 Acquired absence of other left toe(s): Secondary | ICD-10-CM | POA: Diagnosis not present

## 2024-05-05 DIAGNOSIS — L409 Psoriasis, unspecified: Secondary | ICD-10-CM | POA: Diagnosis not present

## 2024-05-05 LAB — FERRITIN: Ferritin: 44 ng/mL (ref 24–336)

## 2024-05-05 LAB — CBC
HCT: 32 % — ABNORMAL LOW (ref 39.0–52.0)
Hemoglobin: 10.4 g/dL — ABNORMAL LOW (ref 13.0–17.0)
MCH: 28 pg (ref 26.0–34.0)
MCHC: 32.5 g/dL (ref 30.0–36.0)
MCV: 86.3 fL (ref 80.0–100.0)
Platelets: 381 K/uL (ref 150–400)
RBC: 3.71 MIL/uL — ABNORMAL LOW (ref 4.22–5.81)
RDW: 13.5 % (ref 11.5–15.5)
WBC: 8.2 K/uL (ref 4.0–10.5)
nRBC: 0 % (ref 0.0–0.2)

## 2024-05-05 LAB — IRON AND TIBC
Iron: 41 ug/dL — ABNORMAL LOW (ref 45–182)
Saturation Ratios: 14 % — ABNORMAL LOW (ref 17.9–39.5)
TIBC: 294 ug/dL (ref 250–450)
UIBC: 253 ug/dL

## 2024-05-05 NOTE — Telephone Encounter (Signed)
 When I called that she states that he is drinking and eating fine he had some problem with his stomach but it was constipation and they gave him gave him medicine and he got better, he still has pain in his stomach and the PCP sent a message over the weekend about that hemoglobin 9.9 the sodium being low the kidney function is the low but it is according to the PCP number is about the same .  The wife says that 1 day this week he slept for 18 hours.  When he got up he still did not feel any good wants to know need to be seen as well as does he need some scans because she is like something is going on but you do not know what it is.

## 2024-05-05 NOTE — Telephone Encounter (Signed)
 The wife called and said that Mr. [name] has been seen at the PCP and you can see the labs on care everywhere and she thinks that the patient needs to have IV iron.   I read the PCP 05/02/24 read His last hemoglobin was 10.3. He takes an over-the-counter iron supplement daily but is unsure of the dosage. Iron infusions have not been offered as his ferritin levels are adequate.   Lauren saw him 04/29/2024    What to do?

## 2024-05-05 NOTE — Telephone Encounter (Signed)
 I asked wife if she is coming with him since he is weak. She asked that she still has to work and she was calling him right after the phone call from me and let him know that he needs to come over at 1:30

## 2024-05-06 ENCOUNTER — Encounter: Payer: Self-pay | Admitting: Nurse Practitioner

## 2024-05-07 ENCOUNTER — Inpatient Hospital Stay (HOSPITAL_BASED_OUTPATIENT_CLINIC_OR_DEPARTMENT_OTHER): Admitting: Nurse Practitioner

## 2024-05-07 DIAGNOSIS — D509 Iron deficiency anemia, unspecified: Secondary | ICD-10-CM

## 2024-05-07 NOTE — Progress Notes (Signed)
 Virtual Visit Progress Note  Symptom Management Clinic  [facility] Department of the [place]. [facility] [address], [address] [place], [place] [address] [phone] (phone) [phone] (fax)  I connected with SAIVON PROWSE on 05/07/24 at 11:00 AM EDT by telephone visit and verified that I am speaking with the correct person using two identifiers.   I discussed the limitations, risks, security and privacy concerns of performing an evaluation and management service by telemedicine and the availability of in-person appointments. I also discussed with the patient that there may be a patient responsible charge related to this service. The patient expressed understanding and agreed to proceed.   Other persons participating in the visit and their role in the encounter: wife Ruby, patient   Patient's location: home  Provider's location: clinic     Patient Care Team: Jimmy Charlie FERNS, MD as PCP - General Rennie Cindy SAUNDERS, MD as Consulting Physician (Oncology)   Name of the patient: [name]  [id number]  December 26, 1955   Date of visit: 05/07/24  Diagnosis- Anemia  Chief complaint/ Reason for visit- Fatigue and anemia  Heme/Onc history:  Oncology History   No history exists.    Interval history- patient is 69 year old male who agrees to telephone visit with his wife to follow up on recent lab results. He reports compliance with ferrous sulfate, takes 1 tablet a day. Endorses GI upset and constipation. Has ongoing fatigue. Recently had bloodwork with pcp and his hemoglobin had dropped. Denies black or bloody stools. No hematuria. Had bleeding of his chronic foot wound.   Review of systems- Review of Systems  Constitutional:  Positive for malaise/fatigue. Negative for weight loss.  Respiratory:  Negative for hemoptysis.   Gastrointestinal:  Positive for nausea. Negative for abdominal pain, blood in stool, constipation,  diarrhea, melena and vomiting.  Genitourinary:  Negative for hematuria.      Allergies  Allergen Reactions   Glipizide Other (See Comments)    REACTION: wt. gain, hands tingling   Lisinopril  Other (See Comments)    HYPERKALEMIA   Ramipril Cough   Ibuprofen     UNSPECIFIED REACTION    Bactrim  [Sulfamethoxazole -Trimethoprim ] Nausea And Vomiting and Rash    Past Medical History:  Diagnosis Date   Adenomatous polyp    Allergy     Anemia    Arthritis    feet    Bunion 04/02/2013   STATUS POST OP BUN REPAIR   Diabetes mellitus    Foot ulcer (HCC)    GERD (gastroesophageal reflux disease)    Gout    Hammertoe    Hyperlipidemia    Hypertension    Neuromuscular disorder (HCC)    neuropathy   Plantar flexed metatarsal    Psoriasis    Schatzki's ring     Past Surgical History:  Procedure Laterality Date   AMPUTATION TOE Left 05/08/2023   Procedure: AMPUTATION 5TH TOE WITH PARTIAL RAY RESECTION;  Surgeon: Neill Boas, DPM;  Location: [facility] ORS;  Service: Orthopedics/Podiatry;  Laterality: Left;   CARPAL TUNNEL RELEASE Left 08/24/2022   Procedure: CARPAL TUNNEL RELEASE;  Surgeon: Cleotilde Barrio, MD;  Location: [facility] OR[facility];  Service: Orthopedics;  Laterality: Left;   CATARACT EXTRACTION W/PHACO Left 03/21/2023   Procedure: CATARACT EXTRACTION PHACO AND INTRAOCULAR LENS PLACEMENT (IOC) LEFT MALYUGIN DIABETIC 9.88 1:01.4;  Surgeon: Mittie Gaskin, MD;  Location: [facility];  Service: Ophthalmology;  Laterality: Left;   CATARACT EXTRACTION W/PHACO Right 04/04/2023  Procedure: CATARACT EXTRACTION PHACO AND INTRAOCULAR LENS PLACEMENT (IOC) RIGHT MALYUGIN DIABETIC 15.31 01:15.1;  Surgeon: Mittie Gaskin, MD;  Location: [facility];  Service: Ophthalmology;  Laterality: Right;   COLONOSCOPY     FOOT AMPUTATION Right 01/11/2017   Dr Tisa ([facility])--transmetatarsal   FOOT SURGERY Right 02/28/2013   ALLEEN HAM TOE2,3 PINS ,2ND MET OSTEOTOMY, 5TH MET W/SCREW    [phone], 04-2013   HIP FRACTURE SURGERY  1993   surgery x2   IR BONE MARROW BIOPSY & ASPIRATION  04/27/2023   LOWER EXTREMITY ANGIOGRAPHY Left 05/07/2023   Procedure: Lower Extremity Angiography;  Surgeon: Marea Selinda RAMAN, MD;  Location: [facility] INVASIVE CV LAB;  Service: Cardiovascular;  Laterality: Left;   NASAL SEPTOPLASTY W/ TURBINOPLASTY Bilateral 02/08/2023   Procedure: NASAL SEPTOPLASTY WITH TURBINATE REDUCTION;  Surgeon: Edda Mt, MD;  Location: [facility];  Service: ENT;  Laterality: Bilateral;   POLYPECTOMY     PROSTATE BIOPSY  10/10/2019   TOTAL HIP ARTHROPLASTY     Left hip replacement/femur fx 07/04, Screw removal left hip- [facility] 11/01    Social History   Socioeconomic History   Marital status: Married    Spouse name: Not on file   Number of children: 3   Years of education: Not on file   Highest education level: 12th grade  Occupational History   Occupation: PC ADMINISTRATOR    Employer: [employer]    Comment: Retired 3/25  Tobacco Use   Smoking status: Never    Passive exposure: Past   Smokeless tobacco: Never  Vaping Use   Vaping status: Never Used  Substance and Sexual Activity   Alcohol  use: Yes    Alcohol /week: 1.0 standard drink of alcohol     Types: 1 Standard drinks or equivalent per week    Comment: occasional (DWI 1991)   Drug use: No   Sexual activity: Yes    Birth control/protection: None  Other Topics Concern   Not on file  Social History Narrative   alcohol  2-3 times a week; never smoked; lives in [place]. With wife; and 2 dog.        No living will   Wife would be health care POA---no clear alternate   Would accept resuscitation   No feeding tube if cognitively unaware   Social Drivers of Health   Financial Resource Strain: Low Risk  (04/22/2024)   Received from [facility]   Overall Financial Resource Strain (CARDIA)    Difficulty of Paying Living Expenses: Not hard at all  Food Insecurity: No Food Insecurity  (04/22/2024)   [facility]   Hunger Vital Sign    Within the past 12 months, you worried that your food would run out before you got the money to buy more.: Never true    Within the past 12 months, the food you bought just didn't last and you didn't have money to get more.: Never true  Transportation Needs: No Transportation Needs (04/22/2024)   Received from [facility] - Transportation    In the past 12 months, has lack of transportation kept you from medical appointments or from getting medications?: No    Lack of Transportation (Non-Medical): No  Physical Activity: Inactive (03/03/2024)   Exercise Vital Sign    Days of Exercise per Week: 0 days    Minutes of Exercise per Session: Not on file  Stress: No Stress Concern Present (03/03/2024)   [employer] of Occupational Health -  Occupational Stress Questionnaire    Feeling of Stress: Only a little  Social Connections: Unknown (03/03/2024)   Social Connection and Isolation Panel    Frequency of Communication with Friends and Family: Twice a week    Frequency of Social Gatherings with Friends and Family: Patient declined    Attends Religious Services: More than 4 times per year    Active Member of [employer] or Organizations: No    Attends Engineer, structural: Not on file    Marital Status: Married  Catering manager Violence: Not At Risk (03/04/2024)   Humiliation, Afraid, Rape, and Kick questionnaire    Fear of Current or Ex-Partner: No    Emotionally Abused: No    Physically Abused: No    Sexually Abused: No    Family History  Problem Relation Age of Onset   Diabetes Mother    Diabetes Brother    Alzheimer's disease Maternal Aunt    Alzheimer's disease Cousin    Cancer Neg Hx    Colon cancer Neg Hx    Colon polyps Neg Hx    Rectal cancer Neg Hx    Stomach cancer Neg Hx    Esophageal cancer Neg Hx      Current Outpatient Medications:    Alcohol  Swabs   (ALCOHOL  PREP) 70 % PADS, USE 1 PREP PAD TWICE DAILY, Disp: 200 each, Rfl: 3   Alpha-Lipoic Acid 600 MG TABS, Take by mouth. 3 in am and 1 in pm, Disp: , Rfl:    amLODipine  (NORVASC ) 5 MG tablet, TAKE 1 TABLET(5 MG) BY MOUTH DAILY, Disp: 90 tablet, Rfl: 3   CALCIUM-MAGNESIUM -ZINC  PO, Take by mouth., Disp: , Rfl:    Cinnamon 500 MG capsule, Take 500 mg by mouth 2 (two) times daily., Disp: , Rfl:    clobetasol ointment (TEMOVATE) 0.05 %, Apply topically., Disp: , Rfl:    CONTOUR NEXT TEST test strip, USE 1 TO 2 TIMES DAILY TO CHECK  BLOOD SUGAR, Disp: 200 strip, Rfl: 3   cyanocobalamin  (VITAMIN B12) 1000 MCG/ML injection, 1 ml injection-once a month., Disp: 12 mL, Rfl: 1   dapagliflozin  propanediol (FARXIGA ) 5 MG TABS tablet, Take 1 tablet (5 mg total) by mouth daily., Disp: 90 tablet, Rfl: 3   DULoxetine  (CYMBALTA ) 60 MG capsule, Take 2 capsules (120 mg total) by mouth daily., Disp: 180 capsule, Rfl: 3   finasteride  (PROSCAR ) 5 MG tablet, Take 1 tablet (5 mg total) by mouth daily., Disp: 90 tablet, Rfl: 0   furosemide  (LASIX ) 20 MG tablet, Take 1 tablet (20 mg total) by mouth daily as needed., Disp: 90 tablet, Rfl: 0   gabapentin  (NEURONTIN ) 400 MG capsule, Take 2 capsules (800 mg total) by mouth 3 (three) times daily., Disp: 540 capsule, Rfl: 3   hydrocortisone  valerate ointment (WEST-CORT) 0.2 %, APPLY TOPICALLY TO AFFECTED  AREA(S) TWICE DAILY, Disp: 120 g, Rfl: 1   hydrOXYzine  (VISTARIL ) 25 MG capsule, TAKE 1 CAPSULE(25 MG) BY MOUTH EVERY 8 HOURS AS NEEDED FOR ITCHING, Disp: 60 capsule, Rfl: 3   insulin  glargine (LANTUS  SOLOSTAR) 100 UNIT/ML Solostar Pen, Inject 24 Units into the skin daily., Disp: 1 mL, Rfl: 3   Insulin  Pen Needle (B-D UF III MINI PEN NEEDLES) 31G X 5 MM MISC, USE AS DIRECTED DAILY, Disp: 90 each, Rfl: 3   losartan  (COZAAR ) 25 MG tablet, TAKE 1 TABLET(25 MG) BY MOUTH DAILY, Disp: 90 tablet, Rfl: 3   metFORMIN  (GLUCOPHAGE ) 1000 MG tablet, Take 1 tablet (1,000 mg total) by mouth  2 (two) times daily with a meal., Disp: 180 tablet, Rfl: 3   Multiple Vitamin (MULTIVITAMIN WITH MINERALS) TABS tablet, Take 1 tablet by mouth daily., Disp: 30 tablet, Rfl: 2   Oxycodone  HCl 10 MG TABS, Take 1 tablet by mouth 5 (five) times daily., Disp: , Rfl:    pantoprazole (PROTONIX) 40 MG tablet, Take 1 tablet (40 mg total) by mouth daily., Disp: 30 tablet, Rfl: 0   simvastatin  (ZOCOR ) 40 MG tablet, Take 1 tablet (40 mg total) by mouth at bedtime., Disp: 90 tablet, Rfl: 0   SYRINGE-NEEDLE, DISP, 3 ML (BD SAFETYGLIDE SYRINGE/NEEDLE) 25G X 1 3 ML MISC, Use the needle for IM injection once a  month; or as directed., Disp: 30 each, Rfl: 1   tamsulosin  (FLOMAX ) 0.4 MG CAPS capsule, Take 1 capsule (0.4 mg total) by mouth daily., Disp: 90 capsule, Rfl: 0   tirzepatide  (MOUNJARO ) 5 MG/0.5ML Pen, Inject 5 mg into the skin once a week., Disp: 2 mL, Rfl: 5   triamcinolone  lotion (KENALOG ) 0.1 %, APPLY TO AFFECTED AREA(S)  TOPICALLY 3 TIMES DAILY, Disp: 180 mL, Rfl: 1  Physical exam: Exam limited due to telemedicine  There were no vitals filed for this visit. Physical Exam Constitutional:      Comments: Patient and wife participated in call  Pulmonary:     Effort: No respiratory distress.  Neurological:     Mental Status: He is alert and oriented to person, place, and time.         Latest Ref Rng & Units 05/02/2024   12:42 PM  CMP  Glucose 70 - 99 mg/dL 872   BUN 6 - 23 mg/dL 31   Creatinine 9.59 - 1.50 mg/dL 8.71   Sodium 864 - 854 mEq/L 125   Potassium 3.5 - 5.1 mEq/L 5.2 No hemolysis seen   Chloride 96 - 112 mEq/L 91   CO2 19 - 32 mEq/L 26   Calcium 8.4 - 10.5 mg/dL 8.7       Latest Ref Rng & Units 05/05/2024    1:09 PM  CBC  WBC 4.0 - 10.5 K/uL 8.2   Hemoglobin 13.0 - 17.0 g/dL 89.5   Hematocrit 60.9 - 52.0 % 32.0   Platelets 150 - 400 K/uL 381    Iron/TIBC/Ferritin/ %Sat    Component Value Date/Time   IRON 41 (L) 05/05/2024 1309   IRON 98 06/28/2022 1116   TIBC 294  05/05/2024 1309   TIBC 312 06/28/2022 1116   FERRITIN 44 05/05/2024 1309   FERRITIN 143 06/28/2022 1116   IRONPCTSAT 14 (L) 05/05/2024 1309   IRONPCTSAT 31 06/28/2022 1116    Assessment and plan- Patient is a 68 y.o. male    1) Anemia- hmg 12, normocytic since May 2023. Dec 2023 no evidence of iron deficiency. SPEP and K/L LC, hepatitis screening, LDH, haptoglobin, CRP were within normal limits. Peripheral smear without evidence of schistocytes. Oct 2024 bone marrow biopsy Variably cellular bone marrow with trilineage hematopoiesis; The bone marrow is generally normocellular for age with trilineage  hematopoiesis.  There are mild nonspecific changes present, likely  secondary in nature.  Correlation with cytogenetic studies is  recommended. US  2024- OCT 2024-  Increased hepatic parenchymal echogenicity suggestive of steatosis.  No cholelithiasis or sonographic evidence for acute cholecystitis.   # Hmg has dropped now to 10 range. Ferritin 44, iron sat 14%. TIBC is not elevated. Serum iron low despite compliance with oral iron supplement. Mild bleeding with chronic foot wound but otherwise  denies blood loss. Last colonoscopy was Dec 2023. Last endoscopy was 10/2022. He has not had a capsule study. Question intake vs malabsorption vs others? He is symptomatic. Discussed increasing oral iron or trying a different formulation vs trial IV iron to optimize stores and see if taht improves his symptoms. He would liek to try IV iron. Discussed risks including possible anaphylaxis. He verbalizes understanding and wishes to proceed. Recommend venofer 200 mg x 2. Plan for labs (cbc, cmp, ferritin, iron studies), Dr Rennie, +/- venofer in 4 months. Discussed that if he has recurrent drops in iron stores may need to consider capsule study with GI.   Visit Diagnosis 1. Iron deficiency anemia, unspecified iron deficiency anemia type    Patient expressed understanding and was in agreement with this plan. He also  understands that He can call clinic at any time with any questions, concerns, or complaints.   I discussed the assessment and treatment plan with the patient. The patient was provided an opportunity to ask questions and all were answered. The patient agreed with the plan and demonstrated an understanding of the instructions.   The patient was advised to call back or seek an in-person evaluation if the symptoms worsen or if the condition fails to improve as anticipated.   I spent 20 minutes on this telephone encounter.   Thank you for allowing me to participate in the care of this very pleasant patient.   Tinnie Dawn, DNP, AGNP-C [facility]

## 2024-05-08 ENCOUNTER — Inpatient Hospital Stay

## 2024-05-08 VITALS — BP 116/70 | HR 72 | Temp 97.2°F | Resp 18

## 2024-05-08 DIAGNOSIS — Z888 Allergy status to other drugs, medicaments and biological substances status: Secondary | ICD-10-CM | POA: Diagnosis not present

## 2024-05-08 DIAGNOSIS — Z9842 Cataract extraction status, left eye: Secondary | ICD-10-CM | POA: Diagnosis not present

## 2024-05-08 DIAGNOSIS — Z886 Allergy status to analgesic agent status: Secondary | ICD-10-CM | POA: Diagnosis not present

## 2024-05-08 DIAGNOSIS — E11621 Type 2 diabetes mellitus with foot ulcer: Secondary | ICD-10-CM | POA: Diagnosis not present

## 2024-05-08 DIAGNOSIS — Z860101 Personal history of adenomatous and serrated colon polyps: Secondary | ICD-10-CM | POA: Diagnosis not present

## 2024-05-08 DIAGNOSIS — D638 Anemia in other chronic diseases classified elsewhere: Secondary | ICD-10-CM | POA: Diagnosis not present

## 2024-05-08 DIAGNOSIS — R5383 Other fatigue: Secondary | ICD-10-CM | POA: Diagnosis not present

## 2024-05-08 DIAGNOSIS — Z882 Allergy status to sulfonamides status: Secondary | ICD-10-CM | POA: Diagnosis not present

## 2024-05-08 DIAGNOSIS — D75839 Thrombocytosis, unspecified: Secondary | ICD-10-CM | POA: Diagnosis not present

## 2024-05-08 DIAGNOSIS — I10 Essential (primary) hypertension: Secondary | ICD-10-CM | POA: Diagnosis not present

## 2024-05-08 DIAGNOSIS — Z89422 Acquired absence of other left toe(s): Secondary | ICD-10-CM | POA: Diagnosis not present

## 2024-05-08 DIAGNOSIS — K5900 Constipation, unspecified: Secondary | ICD-10-CM | POA: Diagnosis not present

## 2024-05-08 DIAGNOSIS — Z833 Family history of diabetes mellitus: Secondary | ICD-10-CM | POA: Diagnosis not present

## 2024-05-08 DIAGNOSIS — D509 Iron deficiency anemia, unspecified: Secondary | ICD-10-CM

## 2024-05-08 DIAGNOSIS — Z818 Family history of other mental and behavioral disorders: Secondary | ICD-10-CM | POA: Diagnosis not present

## 2024-05-08 DIAGNOSIS — E1140 Type 2 diabetes mellitus with diabetic neuropathy, unspecified: Secondary | ICD-10-CM | POA: Diagnosis not present

## 2024-05-08 DIAGNOSIS — L97529 Non-pressure chronic ulcer of other part of left foot with unspecified severity: Secondary | ICD-10-CM | POA: Diagnosis not present

## 2024-05-08 DIAGNOSIS — Z9841 Cataract extraction status, right eye: Secondary | ICD-10-CM | POA: Diagnosis not present

## 2024-05-08 DIAGNOSIS — L409 Psoriasis, unspecified: Secondary | ICD-10-CM | POA: Diagnosis not present

## 2024-05-08 DIAGNOSIS — E785 Hyperlipidemia, unspecified: Secondary | ICD-10-CM | POA: Diagnosis not present

## 2024-05-08 DIAGNOSIS — Z79899 Other long term (current) drug therapy: Secondary | ICD-10-CM | POA: Diagnosis not present

## 2024-05-08 DIAGNOSIS — L97519 Non-pressure chronic ulcer of other part of right foot with unspecified severity: Secondary | ICD-10-CM | POA: Diagnosis not present

## 2024-05-08 MED ORDER — IRON SUCROSE 20 MG/ML IV SOLN
200.0000 mg | Freq: Once | INTRAVENOUS | Status: AC
  Administered 2024-05-08: 200 mg via INTRAVENOUS
  Filled 2024-05-08: qty 10

## 2024-05-08 NOTE — Patient Instructions (Signed)

## 2024-05-09 ENCOUNTER — Ambulatory Visit: Admitting: Nurse Practitioner

## 2024-05-13 DIAGNOSIS — L97512 Non-pressure chronic ulcer of other part of right foot with fat layer exposed: Secondary | ICD-10-CM | POA: Diagnosis not present

## 2024-05-13 DIAGNOSIS — E11610 Type 2 diabetes mellitus with diabetic neuropathic arthropathy: Secondary | ICD-10-CM | POA: Diagnosis not present

## 2024-05-13 DIAGNOSIS — Z89431 Acquired absence of right foot: Secondary | ICD-10-CM | POA: Diagnosis not present

## 2024-05-13 DIAGNOSIS — B351 Tinea unguium: Secondary | ICD-10-CM | POA: Diagnosis not present

## 2024-05-15 ENCOUNTER — Inpatient Hospital Stay

## 2024-05-15 VITALS — BP 121/62 | HR 70 | Temp 97.3°F | Resp 18

## 2024-05-15 DIAGNOSIS — Z79899 Other long term (current) drug therapy: Secondary | ICD-10-CM | POA: Diagnosis not present

## 2024-05-15 DIAGNOSIS — Z9841 Cataract extraction status, right eye: Secondary | ICD-10-CM | POA: Diagnosis not present

## 2024-05-15 DIAGNOSIS — D509 Iron deficiency anemia, unspecified: Secondary | ICD-10-CM

## 2024-05-15 DIAGNOSIS — E1140 Type 2 diabetes mellitus with diabetic neuropathy, unspecified: Secondary | ICD-10-CM | POA: Diagnosis not present

## 2024-05-15 DIAGNOSIS — E11621 Type 2 diabetes mellitus with foot ulcer: Secondary | ICD-10-CM | POA: Diagnosis not present

## 2024-05-15 DIAGNOSIS — D638 Anemia in other chronic diseases classified elsewhere: Secondary | ICD-10-CM | POA: Diagnosis not present

## 2024-05-15 DIAGNOSIS — Z833 Family history of diabetes mellitus: Secondary | ICD-10-CM | POA: Diagnosis not present

## 2024-05-15 DIAGNOSIS — L409 Psoriasis, unspecified: Secondary | ICD-10-CM | POA: Diagnosis not present

## 2024-05-15 DIAGNOSIS — Z818 Family history of other mental and behavioral disorders: Secondary | ICD-10-CM | POA: Diagnosis not present

## 2024-05-15 DIAGNOSIS — D75839 Thrombocytosis, unspecified: Secondary | ICD-10-CM | POA: Diagnosis not present

## 2024-05-15 DIAGNOSIS — Z882 Allergy status to sulfonamides status: Secondary | ICD-10-CM | POA: Diagnosis not present

## 2024-05-15 DIAGNOSIS — Z9842 Cataract extraction status, left eye: Secondary | ICD-10-CM | POA: Diagnosis not present

## 2024-05-15 DIAGNOSIS — L97519 Non-pressure chronic ulcer of other part of right foot with unspecified severity: Secondary | ICD-10-CM | POA: Diagnosis not present

## 2024-05-15 DIAGNOSIS — Z860101 Personal history of adenomatous and serrated colon polyps: Secondary | ICD-10-CM | POA: Diagnosis not present

## 2024-05-15 DIAGNOSIS — Z89422 Acquired absence of other left toe(s): Secondary | ICD-10-CM | POA: Diagnosis not present

## 2024-05-15 DIAGNOSIS — Z886 Allergy status to analgesic agent status: Secondary | ICD-10-CM | POA: Diagnosis not present

## 2024-05-15 DIAGNOSIS — L97529 Non-pressure chronic ulcer of other part of left foot with unspecified severity: Secondary | ICD-10-CM | POA: Diagnosis not present

## 2024-05-15 DIAGNOSIS — K5900 Constipation, unspecified: Secondary | ICD-10-CM | POA: Diagnosis not present

## 2024-05-15 DIAGNOSIS — I10 Essential (primary) hypertension: Secondary | ICD-10-CM | POA: Diagnosis not present

## 2024-05-15 DIAGNOSIS — E785 Hyperlipidemia, unspecified: Secondary | ICD-10-CM | POA: Diagnosis not present

## 2024-05-15 DIAGNOSIS — Z888 Allergy status to other drugs, medicaments and biological substances status: Secondary | ICD-10-CM | POA: Diagnosis not present

## 2024-05-15 DIAGNOSIS — R5383 Other fatigue: Secondary | ICD-10-CM | POA: Diagnosis not present

## 2024-05-15 MED ORDER — IRON SUCROSE 20 MG/ML IV SOLN
200.0000 mg | Freq: Once | INTRAVENOUS | Status: AC
  Administered 2024-05-15: 200 mg via INTRAVENOUS
  Filled 2024-05-15: qty 10

## 2024-05-15 NOTE — Progress Notes (Signed)
 Patient declined to wait the 30 minutes for post iron infusion observation today. Tolerated infusion well. VSS.

## 2024-05-20 ENCOUNTER — Other Ambulatory Visit
Admission: RE | Admit: 2024-05-20 | Discharge: 2024-05-20 | Disposition: A | Discharge status: Home / Self Care | Source: Ambulatory Visit | Attending: Podiatry | Admitting: Podiatry

## 2024-05-20 DIAGNOSIS — L03119 Cellulitis of unspecified part of limb: Secondary | ICD-10-CM | POA: Insufficient documentation

## 2024-05-20 DIAGNOSIS — L02619 Cutaneous abscess of unspecified foot: Secondary | ICD-10-CM | POA: Insufficient documentation

## 2024-05-20 DIAGNOSIS — I7090 Unspecified atherosclerosis: Secondary | ICD-10-CM | POA: Diagnosis not present

## 2024-05-20 DIAGNOSIS — S91301A Unspecified open wound, right foot, initial encounter: Secondary | ICD-10-CM | POA: Diagnosis not present

## 2024-05-20 DIAGNOSIS — E039 Hypothyroidism, unspecified: Secondary | ICD-10-CM | POA: Diagnosis present

## 2024-05-20 DIAGNOSIS — L97512 Non-pressure chronic ulcer of other part of right foot with fat layer exposed: Secondary | ICD-10-CM | POA: Insufficient documentation

## 2024-05-20 DIAGNOSIS — Z89431 Acquired absence of right foot: Secondary | ICD-10-CM | POA: Diagnosis not present

## 2024-05-20 DIAGNOSIS — Z794 Long term (current) use of insulin: Secondary | ICD-10-CM | POA: Diagnosis not present

## 2024-05-20 DIAGNOSIS — Z89421 Acquired absence of other right toe(s): Secondary | ICD-10-CM | POA: Diagnosis not present

## 2024-05-20 DIAGNOSIS — E1142 Type 2 diabetes mellitus with diabetic polyneuropathy: Secondary | ICD-10-CM | POA: Diagnosis not present

## 2024-05-20 DIAGNOSIS — M7731 Calcaneal spur, right foot: Secondary | ICD-10-CM | POA: Diagnosis not present

## 2024-05-20 DIAGNOSIS — E871 Hypo-osmolality and hyponatremia: Secondary | ICD-10-CM | POA: Diagnosis present

## 2024-05-20 DIAGNOSIS — L02611 Cutaneous abscess of right foot: Secondary | ICD-10-CM | POA: Diagnosis present

## 2024-05-20 DIAGNOSIS — L03115 Cellulitis of right lower limb: Secondary | ICD-10-CM | POA: Diagnosis not present

## 2024-05-20 DIAGNOSIS — L089 Local infection of the skin and subcutaneous tissue, unspecified: Secondary | ICD-10-CM | POA: Diagnosis not present

## 2024-05-20 DIAGNOSIS — D649 Anemia, unspecified: Secondary | ICD-10-CM | POA: Diagnosis present

## 2024-05-20 DIAGNOSIS — E11628 Type 2 diabetes mellitus with other skin complications: Secondary | ICD-10-CM | POA: Diagnosis present

## 2024-05-20 DIAGNOSIS — E1165 Type 2 diabetes mellitus with hyperglycemia: Secondary | ICD-10-CM | POA: Diagnosis present

## 2024-05-20 DIAGNOSIS — M86171 Other acute osteomyelitis, right ankle and foot: Secondary | ICD-10-CM | POA: Diagnosis present

## 2024-05-20 DIAGNOSIS — M62461 Contracture of muscle, right lower leg: Secondary | ICD-10-CM | POA: Diagnosis not present

## 2024-05-20 DIAGNOSIS — I739 Peripheral vascular disease, unspecified: Secondary | ICD-10-CM | POA: Diagnosis not present

## 2024-05-20 DIAGNOSIS — D72829 Elevated white blood cell count, unspecified: Secondary | ICD-10-CM | POA: Diagnosis present

## 2024-05-20 DIAGNOSIS — M7989 Other specified soft tissue disorders: Secondary | ICD-10-CM | POA: Diagnosis not present

## 2024-05-20 DIAGNOSIS — E11621 Type 2 diabetes mellitus with foot ulcer: Secondary | ICD-10-CM | POA: Diagnosis present

## 2024-05-20 DIAGNOSIS — E1140 Type 2 diabetes mellitus with diabetic neuropathy, unspecified: Secondary | ICD-10-CM | POA: Diagnosis present

## 2024-05-20 DIAGNOSIS — M65171 Other infective (teno)synovitis, right ankle and foot: Secondary | ICD-10-CM | POA: Diagnosis present

## 2024-05-20 DIAGNOSIS — I10 Essential (primary) hypertension: Secondary | ICD-10-CM | POA: Diagnosis present

## 2024-05-20 DIAGNOSIS — L97514 Non-pressure chronic ulcer of other part of right foot with necrosis of bone: Secondary | ICD-10-CM | POA: Diagnosis not present

## 2024-05-20 DIAGNOSIS — E66811 Obesity, class 1: Secondary | ICD-10-CM | POA: Diagnosis present

## 2024-05-20 DIAGNOSIS — F1120 Opioid dependence, uncomplicated: Secondary | ICD-10-CM | POA: Diagnosis present

## 2024-05-20 DIAGNOSIS — E1169 Type 2 diabetes mellitus with other specified complication: Secondary | ICD-10-CM | POA: Diagnosis present

## 2024-05-20 DIAGNOSIS — A480 Gas gangrene: Secondary | ICD-10-CM | POA: Diagnosis not present

## 2024-05-20 DIAGNOSIS — N400 Enlarged prostate without lower urinary tract symptoms: Secondary | ICD-10-CM | POA: Diagnosis present

## 2024-05-20 DIAGNOSIS — K219 Gastro-esophageal reflux disease without esophagitis: Secondary | ICD-10-CM | POA: Diagnosis present

## 2024-05-20 DIAGNOSIS — F1010 Alcohol abuse, uncomplicated: Secondary | ICD-10-CM | POA: Diagnosis present

## 2024-05-20 DIAGNOSIS — E785 Hyperlipidemia, unspecified: Secondary | ICD-10-CM | POA: Diagnosis present

## 2024-05-20 DIAGNOSIS — Z7984 Long term (current) use of oral hypoglycemic drugs: Secondary | ICD-10-CM | POA: Diagnosis not present

## 2024-05-20 DIAGNOSIS — E1151 Type 2 diabetes mellitus with diabetic peripheral angiopathy without gangrene: Secondary | ICD-10-CM | POA: Diagnosis present

## 2024-05-20 DIAGNOSIS — E11610 Type 2 diabetes mellitus with diabetic neuropathic arthropathy: Secondary | ICD-10-CM | POA: Diagnosis present

## 2024-05-20 DIAGNOSIS — R600 Localized edema: Secondary | ICD-10-CM | POA: Diagnosis not present

## 2024-05-20 DIAGNOSIS — L97519 Non-pressure chronic ulcer of other part of right foot with unspecified severity: Secondary | ICD-10-CM | POA: Diagnosis present

## 2024-05-21 ENCOUNTER — Emergency Department

## 2024-05-21 ENCOUNTER — Other Ambulatory Visit: Payer: Self-pay

## 2024-05-21 ENCOUNTER — Inpatient Hospital Stay
Admission: EM | Admit: 2024-05-21 | Discharge: 2024-05-27 | DRG: 629 | Disposition: A | Discharge status: Home Health | Source: Ambulatory Visit | Attending: Osteopathic Medicine | Admitting: Osteopathic Medicine

## 2024-05-21 DIAGNOSIS — M86171 Other acute osteomyelitis, right ankle and foot: Secondary | ICD-10-CM | POA: Diagnosis present

## 2024-05-21 DIAGNOSIS — E785 Hyperlipidemia, unspecified: Secondary | ICD-10-CM | POA: Diagnosis present

## 2024-05-21 DIAGNOSIS — E11628 Type 2 diabetes mellitus with other skin complications: Secondary | ICD-10-CM | POA: Diagnosis present

## 2024-05-21 DIAGNOSIS — Z794 Long term (current) use of insulin: Secondary | ICD-10-CM

## 2024-05-21 DIAGNOSIS — E1165 Type 2 diabetes mellitus with hyperglycemia: Secondary | ICD-10-CM | POA: Diagnosis present

## 2024-05-21 DIAGNOSIS — L02611 Cutaneous abscess of right foot: Secondary | ICD-10-CM | POA: Diagnosis present

## 2024-05-21 DIAGNOSIS — F1120 Opioid dependence, uncomplicated: Secondary | ICD-10-CM | POA: Diagnosis present

## 2024-05-21 DIAGNOSIS — L97519 Non-pressure chronic ulcer of other part of right foot with unspecified severity: Secondary | ICD-10-CM | POA: Diagnosis not present

## 2024-05-21 DIAGNOSIS — K219 Gastro-esophageal reflux disease without esophagitis: Secondary | ICD-10-CM | POA: Diagnosis present

## 2024-05-21 DIAGNOSIS — F1010 Alcohol abuse, uncomplicated: Secondary | ICD-10-CM | POA: Diagnosis present

## 2024-05-21 DIAGNOSIS — Z961 Presence of intraocular lens: Secondary | ICD-10-CM | POA: Diagnosis present

## 2024-05-21 DIAGNOSIS — D72829 Elevated white blood cell count, unspecified: Secondary | ICD-10-CM | POA: Diagnosis present

## 2024-05-21 DIAGNOSIS — L089 Local infection of the skin and subcutaneous tissue, unspecified: Secondary | ICD-10-CM

## 2024-05-21 DIAGNOSIS — E66811 Obesity, class 1: Secondary | ICD-10-CM | POA: Diagnosis present

## 2024-05-21 DIAGNOSIS — Z79899 Other long term (current) drug therapy: Secondary | ICD-10-CM

## 2024-05-21 DIAGNOSIS — Z79890 Hormone replacement therapy (postmenopausal): Secondary | ICD-10-CM

## 2024-05-21 DIAGNOSIS — Z888 Allergy status to other drugs, medicaments and biological substances status: Secondary | ICD-10-CM

## 2024-05-21 DIAGNOSIS — Z96642 Presence of left artificial hip joint: Secondary | ICD-10-CM | POA: Diagnosis present

## 2024-05-21 DIAGNOSIS — M24574 Contracture, right foot: Secondary | ICD-10-CM | POA: Diagnosis present

## 2024-05-21 DIAGNOSIS — E039 Hypothyroidism, unspecified: Secondary | ICD-10-CM | POA: Diagnosis present

## 2024-05-21 DIAGNOSIS — Z7985 Long-term (current) use of injectable non-insulin antidiabetic drugs: Secondary | ICD-10-CM

## 2024-05-21 DIAGNOSIS — Z7984 Long term (current) use of oral hypoglycemic drugs: Secondary | ICD-10-CM | POA: Diagnosis not present

## 2024-05-21 DIAGNOSIS — I739 Peripheral vascular disease, unspecified: Secondary | ICD-10-CM | POA: Diagnosis not present

## 2024-05-21 DIAGNOSIS — I7090 Unspecified atherosclerosis: Secondary | ICD-10-CM | POA: Diagnosis not present

## 2024-05-21 DIAGNOSIS — I10 Essential (primary) hypertension: Secondary | ICD-10-CM | POA: Diagnosis present

## 2024-05-21 DIAGNOSIS — Z6829 Body mass index (BMI) 29.0-29.9, adult: Secondary | ICD-10-CM

## 2024-05-21 DIAGNOSIS — Z820 Family history of epilepsy and other diseases of the nervous system: Secondary | ICD-10-CM

## 2024-05-21 DIAGNOSIS — E1140 Type 2 diabetes mellitus with diabetic neuropathy, unspecified: Secondary | ICD-10-CM | POA: Diagnosis present

## 2024-05-21 DIAGNOSIS — Z833 Family history of diabetes mellitus: Secondary | ICD-10-CM

## 2024-05-21 DIAGNOSIS — Z91199 Patient's noncompliance with other medical treatment and regimen due to unspecified reason: Secondary | ICD-10-CM

## 2024-05-21 DIAGNOSIS — R600 Localized edema: Secondary | ICD-10-CM | POA: Diagnosis not present

## 2024-05-21 DIAGNOSIS — Z89422 Acquired absence of other left toe(s): Secondary | ICD-10-CM

## 2024-05-21 DIAGNOSIS — M7731 Calcaneal spur, right foot: Secondary | ICD-10-CM | POA: Diagnosis not present

## 2024-05-21 DIAGNOSIS — E1169 Type 2 diabetes mellitus with other specified complication: Principal | ICD-10-CM | POA: Diagnosis present

## 2024-05-21 DIAGNOSIS — D649 Anemia, unspecified: Secondary | ICD-10-CM | POA: Diagnosis present

## 2024-05-21 DIAGNOSIS — E871 Hypo-osmolality and hyponatremia: Secondary | ICD-10-CM | POA: Diagnosis present

## 2024-05-21 DIAGNOSIS — E11610 Type 2 diabetes mellitus with diabetic neuropathic arthropathy: Secondary | ICD-10-CM | POA: Diagnosis present

## 2024-05-21 DIAGNOSIS — M65171 Other infective (teno)synovitis, right ankle and foot: Secondary | ICD-10-CM | POA: Diagnosis present

## 2024-05-21 DIAGNOSIS — Z9842 Cataract extraction status, left eye: Secondary | ICD-10-CM

## 2024-05-21 DIAGNOSIS — E11621 Type 2 diabetes mellitus with foot ulcer: Secondary | ICD-10-CM | POA: Diagnosis not present

## 2024-05-21 DIAGNOSIS — Z89421 Acquired absence of other right toe(s): Secondary | ICD-10-CM

## 2024-05-21 DIAGNOSIS — S91301A Unspecified open wound, right foot, initial encounter: Principal | ICD-10-CM

## 2024-05-21 DIAGNOSIS — E1151 Type 2 diabetes mellitus with diabetic peripheral angiopathy without gangrene: Secondary | ICD-10-CM | POA: Diagnosis present

## 2024-05-21 DIAGNOSIS — N400 Enlarged prostate without lower urinary tract symptoms: Secondary | ICD-10-CM | POA: Diagnosis present

## 2024-05-21 DIAGNOSIS — Z886 Allergy status to analgesic agent status: Secondary | ICD-10-CM

## 2024-05-21 DIAGNOSIS — Z9841 Cataract extraction status, right eye: Secondary | ICD-10-CM

## 2024-05-21 DIAGNOSIS — M7989 Other specified soft tissue disorders: Secondary | ICD-10-CM | POA: Diagnosis not present

## 2024-05-21 LAB — CBC WITH DIFFERENTIAL/PLATELET
Abs Immature Granulocytes: 0.11 K/uL — ABNORMAL HIGH (ref 0.00–0.07)
Basophils Absolute: 0.1 K/uL (ref 0.0–0.1)
Basophils Relative: 0 %
Eosinophils Absolute: 0 K/uL (ref 0.0–0.5)
Eosinophils Relative: 0 %
HCT: 27.9 % — ABNORMAL LOW (ref 39.0–52.0)
Hemoglobin: 8.9 g/dL — ABNORMAL LOW (ref 13.0–17.0)
Immature Granulocytes: 1 %
Lymphocytes Relative: 9 %
Lymphs Abs: 1.2 K/uL (ref 0.7–4.0)
MCH: 27.9 pg (ref 26.0–34.0)
MCHC: 31.9 g/dL (ref 30.0–36.0)
MCV: 87.5 fL (ref 80.0–100.0)
Monocytes Absolute: 0.8 K/uL (ref 0.1–1.0)
Monocytes Relative: 6 %
Neutro Abs: 11.9 K/uL — ABNORMAL HIGH (ref 1.7–7.7)
Neutrophils Relative %: 84 %
Platelets: 427 K/uL — ABNORMAL HIGH (ref 150–400)
RBC: 3.19 MIL/uL — ABNORMAL LOW (ref 4.22–5.81)
RDW: 14.7 % (ref 11.5–15.5)
WBC: 14.1 K/uL — ABNORMAL HIGH (ref 4.0–10.5)
nRBC: 0 % (ref 0.0–0.2)

## 2024-05-21 LAB — URINALYSIS, W/ REFLEX TO CULTURE (INFECTION SUSPECTED)
Bacteria, UA: NONE SEEN
Bilirubin Urine: NEGATIVE
Glucose, UA: >=500 mg/dL — AB
Hgb urine dipstick: NEGATIVE
Ketones, ur: NEGATIVE mg/dL
Leukocytes,Ua: NEGATIVE
Nitrite: NEGATIVE
Protein, ur: NEGATIVE mg/dL
Specific Gravity, Urine: 1.016 (ref 1.005–1.030)
pH: 5 (ref 5.0–8.0)

## 2024-05-21 LAB — OSMOLALITY: Osmolality: 272 mosm/kg — ABNORMAL LOW (ref 275–295)

## 2024-05-21 LAB — SODIUM, URINE, RANDOM: Sodium, Ur: <30 mmol/L

## 2024-05-21 LAB — COMPREHENSIVE METABOLIC PANEL WITH GFR
ALT: 56 U/L — ABNORMAL HIGH (ref 0–44)
AST: 50 U/L — ABNORMAL HIGH (ref 15–41)
Albumin: 2.7 g/dL — ABNORMAL LOW (ref 3.5–5.0)
Alkaline Phosphatase: 211 U/L — ABNORMAL HIGH (ref 38–126)
Anion gap: 14 (ref 5–15)
BUN: 27 mg/dL — ABNORMAL HIGH (ref 8–23)
CO2: 22 mmol/L (ref 22–32)
Calcium: 8.6 mg/dL — ABNORMAL LOW (ref 8.9–10.3)
Chloride: 90 mmol/L — ABNORMAL LOW (ref 98–111)
Creatinine, Ser: 1.27 mg/dL — ABNORMAL HIGH (ref 0.61–1.24)
GFR, Estimated: >60 mL/min (ref >60)
Glucose, Bld: 199 mg/dL — ABNORMAL HIGH (ref 70–99)
Potassium: 4.5 mmol/L (ref 3.5–5.1)
Sodium: 126 mmol/L — ABNORMAL LOW (ref 135–145)
Total Bilirubin: 0.9 mg/dL (ref 0.0–1.2)
Total Protein: 6.8 g/dL (ref 6.5–8.1)

## 2024-05-21 LAB — GLUCOSE, CAPILLARY
Glucose-Capillary: 116 mg/dL — ABNORMAL HIGH (ref 70–99)
Glucose-Capillary: 131 mg/dL — ABNORMAL HIGH (ref 70–99)
Glucose-Capillary: 121 mg/dL — ABNORMAL HIGH (ref 70–99)

## 2024-05-21 LAB — OSMOLALITY, URINE: Osmolality, Ur: 389 mosm/kg (ref 300–900)

## 2024-05-21 LAB — LACTIC ACID, PLASMA: Lactic Acid, Venous: 1.6 mmol/L (ref 0.5–1.9)

## 2024-05-21 LAB — TSH: TSH: 10.814 u[IU]/mL — ABNORMAL HIGH (ref 0.350–4.500)

## 2024-05-21 MED ORDER — VANCOMYCIN HCL IN DEXTROSE 1-5 GM/200ML-% IV SOLN
1000.0000 mg | Freq: Two times a day (BID) | INTRAVENOUS | Status: DC
  Administered 2024-05-22 – 2024-05-23 (×3): 1000 mg via INTRAVENOUS
  Filled 2024-05-21 (×4): qty 200

## 2024-05-21 MED ORDER — SENNOSIDES-DOCUSATE SODIUM 8.6-50 MG PO TABS: 1.0000 | ORAL_TABLET | Freq: Every evening | ORAL | Status: DC | PRN

## 2024-05-21 MED ORDER — INSULIN ASPART 100 UNIT/ML IJ SOLN
0.0000 [IU] | Freq: Three times a day (TID) | INTRAMUSCULAR | Status: DC
  Administered 2024-05-21 – 2024-05-23 (×5): 2 [IU] via SUBCUTANEOUS
  Administered 2024-05-24: 3 [IU] via SUBCUTANEOUS
  Administered 2024-05-24: 8 [IU] via SUBCUTANEOUS
  Administered 2024-05-25 – 2024-05-27 (×3): 2 [IU] via SUBCUTANEOUS
  Filled 2024-05-21 (×8): qty 1

## 2024-05-21 MED ORDER — DOCUSATE SODIUM 100 MG PO CAPS
300.0000 mg | ORAL_CAPSULE | Freq: Two times a day (BID) | ORAL | Status: DC | PRN
  Administered 2024-05-22 – 2024-05-25 (×3): 300 mg via ORAL
  Filled 2024-05-21 (×4): qty 3

## 2024-05-21 MED ORDER — PANTOPRAZOLE SODIUM 40 MG PO TBEC
40.0000 mg | DELAYED_RELEASE_TABLET | Freq: Every day | ORAL | Status: DC
  Administered 2024-05-22 – 2024-05-27 (×6): 40 mg via ORAL
  Filled 2024-05-21 (×6): qty 1

## 2024-05-21 MED ORDER — BISACODYL 5 MG PO TBEC: 30.0000 mg | DELAYED_RELEASE_TABLET | Freq: Every day | ORAL | Status: DC | PRN

## 2024-05-21 MED ORDER — PIPERACILLIN-TAZOBACTAM 3.375 G IVPB
3.3750 g | Freq: Three times a day (TID) | INTRAVENOUS | Status: DC
  Administered 2024-05-21 – 2024-05-23 (×6): 3.375 g via INTRAVENOUS
  Filled 2024-05-21 (×7): qty 50

## 2024-05-21 MED ORDER — FINASTERIDE 5 MG PO TABS
5.0000 mg | ORAL_TABLET | Freq: Every day | ORAL | Status: DC
  Administered 2024-05-21 – 2024-05-26 (×6): 5 mg via ORAL
  Filled 2024-05-21 (×6): qty 1

## 2024-05-21 MED ORDER — INSULIN ASPART 100 UNIT/ML IJ SOLN
0.0000 [IU] | Freq: Every day | INTRAMUSCULAR | Status: DC
  Administered 2024-05-23: 2 [IU] via SUBCUTANEOUS
  Filled 2024-05-21 (×3): qty 1

## 2024-05-21 MED ORDER — VANCOMYCIN HCL IN DEXTROSE 1-5 GM/200ML-% IV SOLN
1000.0000 mg | Freq: Once | INTRAVENOUS | Status: AC
  Administered 2024-05-21: 1000 mg via INTRAVENOUS
  Filled 2024-05-21: qty 200

## 2024-05-21 MED ORDER — MONTELUKAST SODIUM 10 MG PO TABS
10.0000 mg | ORAL_TABLET | Freq: Every day | ORAL | Status: DC
Start: 2024-05-21 — End: 2024-05-21

## 2024-05-21 MED ORDER — HYDROXYZINE HCL 25 MG PO TABS: 25.0000 mg | ORAL_TABLET | Freq: Three times a day (TID) | ORAL | Status: DC | PRN

## 2024-05-21 MED ORDER — ONDANSETRON HCL 4 MG PO TABS: 4.0000 mg | ORAL_TABLET | Freq: Four times a day (QID) | ORAL | Status: DC | PRN

## 2024-05-21 MED ORDER — PIPERACILLIN-TAZOBACTAM 3.375 G IVPB 30 MIN
3.3750 g | Freq: Once | INTRAVENOUS | Status: AC
  Administered 2024-05-21: 3.375 g via INTRAVENOUS
  Filled 2024-05-21 (×2): qty 50

## 2024-05-21 MED ORDER — SODIUM CHLORIDE 0.9 % IV BOLUS
1000.0000 mL | Freq: Once | INTRAVENOUS | Status: AC
  Administered 2024-05-21: 1000 mL via INTRAVENOUS

## 2024-05-21 MED ORDER — LOSARTAN POTASSIUM 25 MG PO TABS
25.0000 mg | ORAL_TABLET | Freq: Every day | ORAL | Status: DC
  Administered 2024-05-22 – 2024-05-27 (×6): 25 mg via ORAL
  Filled 2024-05-21 (×6): qty 1

## 2024-05-21 MED ORDER — ONDANSETRON HCL 4 MG/2ML IJ SOLN: 4.0000 mg | Freq: Four times a day (QID) | INTRAMUSCULAR | Status: DC | PRN

## 2024-05-21 MED ORDER — TAMSULOSIN HCL 0.4 MG PO CAPS
0.4000 mg | ORAL_CAPSULE | Freq: Every day | ORAL | Status: DC
  Administered 2024-05-21 – 2024-05-26 (×6): 0.4 mg via ORAL
  Filled 2024-05-21 (×6): qty 1

## 2024-05-21 MED ORDER — BISACODYL 5 MG PO TBEC: 5.0000 mg | DELAYED_RELEASE_TABLET | Freq: Every day | ORAL | Status: DC | PRN

## 2024-05-21 MED ORDER — BISACODYL 5 MG PO TBEC
5.0000 mg | DELAYED_RELEASE_TABLET | Freq: Every day | ORAL | Status: DC | PRN
  Administered 2024-05-22 – 2024-05-25 (×2): 5 mg via ORAL
  Filled 2024-05-21 (×2): qty 1

## 2024-05-21 MED ORDER — OXYCODONE HCL 5 MG PO TABS
10.0000 mg | ORAL_TABLET | Freq: Every day | ORAL | Status: DC | PRN
  Administered 2024-05-21 – 2024-05-24 (×7): 10 mg via ORAL
  Filled 2024-05-21 (×7): qty 2

## 2024-05-21 MED ORDER — GABAPENTIN 300 MG PO CAPS
800.0000 mg | ORAL_CAPSULE | Freq: Three times a day (TID) | ORAL | Status: DC
Start: 2024-05-21 — End: 2024-05-21

## 2024-05-21 MED ORDER — SIMVASTATIN 20 MG PO TABS
40.0000 mg | ORAL_TABLET | Freq: Every day | ORAL | Status: DC
  Administered 2024-05-21 – 2024-05-26 (×6): 40 mg via ORAL
  Filled 2024-05-21: qty 2
  Filled 2024-05-21: qty 4
  Filled 2024-05-21 (×2): qty 2
  Filled 2024-05-21: qty 4
  Filled 2024-05-21: qty 2

## 2024-05-21 MED ORDER — ENOXAPARIN SODIUM 40 MG/0.4ML IJ SOSY
40.0000 mg | PREFILLED_SYRINGE | INTRAMUSCULAR | Status: DC
  Administered 2024-05-21 – 2024-05-26 (×6): 40 mg via SUBCUTANEOUS
  Filled 2024-05-21 (×6): qty 0.4

## 2024-05-21 MED ORDER — AMLODIPINE BESYLATE 5 MG PO TABS
5.0000 mg | ORAL_TABLET | Freq: Every day | ORAL | Status: DC
  Administered 2024-05-22 – 2024-05-27 (×6): 5 mg via ORAL
  Filled 2024-05-21 (×6): qty 1

## 2024-05-21 MED ORDER — INSULIN GLARGINE-YFGN 100 UNIT/ML ~~LOC~~ SOLN
20.0000 [IU] | Freq: Every day | SUBCUTANEOUS | Status: DC
  Administered 2024-05-22 – 2024-05-27 (×5): 20 [IU] via SUBCUTANEOUS
  Filled 2024-05-21 (×6): qty 0.2

## 2024-05-21 MED ORDER — VANCOMYCIN HCL IN DEXTROSE 1-5 GM/200ML-% IV SOLN
1000.0000 mg | Freq: Once | INTRAVENOUS | Status: AC
  Administered 2024-05-21: 1000 mg via INTRAVENOUS
  Filled 2024-05-21 (×2): qty 200

## 2024-05-21 MED ORDER — DULOXETINE HCL 30 MG PO CPEP
60.0000 mg | ORAL_CAPSULE | Freq: Two times a day (BID) | ORAL | Status: DC
  Administered 2024-05-21 – 2024-05-27 (×12): 60 mg via ORAL
  Filled 2024-05-21 (×2): qty 1
  Filled 2024-05-21 (×8): qty 2
  Filled 2024-05-21: qty 1
  Filled 2024-05-21 (×3): qty 2
  Filled 2024-05-21: qty 1
  Filled 2024-05-21: qty 2

## 2024-05-21 MED ORDER — GABAPENTIN 400 MG PO CAPS
800.0000 mg | ORAL_CAPSULE | Freq: Three times a day (TID) | ORAL | Status: DC
  Administered 2024-05-21 – 2024-05-27 (×19): 800 mg via ORAL
  Filled 2024-05-21 (×19): qty 2

## 2024-05-21 NOTE — H&P (Signed)
 History and Physical    [name] FMW:[id number] DOB: [birth year] DOA: 05/21/2024  PCP: Jimmy Charlie FERNS, MD (Confirm with patient/family/NH records and if not entered, this has to be entered at [facility] point of entry) Patient coming from: Home  I have personally briefly reviewed patient's old medical records in [facility]  Chief Complaint: Right foot ulcer  HPI: [name] is a 68 y.o. male with medical history significant of IDDM, diabetic foot infection status post right transmetatarsal amputation, HTN, HLD, PVD, alcohol  abuse, psoriasis, Schatzki's ring, diabetic neuropathy, sent from podiatrist office for evaluation of worsening of right foot infection.  Patient has a chronic right foot ulcer developed 2 to 3 months ago, for which he has been followed by outpatient wound care and podiatry.  This time he started noticed increasing rash and swelling of the right foot, and started to have low-grade fever last 2 days with Tmax 101 last night.  He went to see podiatry today who sent patient to ED for evaluation of worsening of right foot infection.  Due to peripheral neuropathy, he does not have any pain associated with right foot ulcer.  He also reported claudication of right leg, 2-3/10 with prolonged ambulating.  ED Course: Afebrile, blood pressure 140/75 O2 saturation 95% on room air.  X-ray showed right foot soft tissue swelling but no signs of osteomyelitis.  MRI showed right foot abscess and tenosynovitis but no signs of osteomyelitis.  Blood work showed WBC 14.1 hemoglobin 8.9 compared to baseline 9.5-11, BUN 27 creatinine 1.2 sodium 126.  Patient was started on broad-spectrum antibiotics vancomycin  and Zosyn  in the ED.  Review of Systems: As per HPI otherwise 14 point review of systems negative.    Past Medical History:  Diagnosis Date   Adenomatous polyp    Allergy     Anemia    Arthritis    feet    Bunion 04/02/2013   STATUS POST OP BUN REPAIR   Diabetes mellitus     Foot ulcer (HCC)    GERD (gastroesophageal reflux disease)    Gout    Hammertoe    Hyperlipidemia    Hypertension    Neuromuscular disorder (HCC)    neuropathy   Plantar flexed metatarsal    Psoriasis    Schatzki's ring     Past Surgical History:  Procedure Laterality Date   AMPUTATION TOE Left 05/08/2023   Procedure: AMPUTATION 5TH TOE WITH PARTIAL RAY RESECTION;  Surgeon: Neill Boas, DPM;  Location: [facility] ORS;  Service: Orthopedics/Podiatry;  Laterality: Left;   CARPAL TUNNEL RELEASE Left 08/24/2022   Procedure: CARPAL TUNNEL RELEASE;  Surgeon: Cleotilde Barrio, MD;  Location: [facility] ORS;  Service: Orthopedics;  Laterality: Left;   CATARACT EXTRACTION W/PHACO Left 03/21/2023   Procedure: CATARACT EXTRACTION PHACO AND INTRAOCULAR LENS PLACEMENT (IOC) LEFT MALYUGIN DIABETIC 9.88 1:01.4;  Surgeon: Mittie Gaskin, MD;  Location: [facility];  Service: Ophthalmology;  Laterality: Left;   CATARACT EXTRACTION W/PHACO Right 04/04/2023   Procedure: CATARACT EXTRACTION PHACO AND INTRAOCULAR LENS PLACEMENT (IOC) RIGHT MALYUGIN DIABETIC 15.31 01:15.1;  Surgeon: Mittie Gaskin, MD;  Location: [facility];  Service: Ophthalmology;  Laterality: Right;   COLONOSCOPY     FOOT AMPUTATION Right 01/11/2017   Dr Tisa ([facility])--transmetatarsal   FOOT SURGERY Right 02/28/2013   ALLEEN HAM TOE2,3 PINS ,2ND MET OSTEOTOMY, 5TH MET W/SCREW   02-2013, 04-2013   HIP FRACTURE SURGERY  1993   surgery x2   IR BONE MARROW BIOPSY & ASPIRATION  04/27/2023  LOWER EXTREMITY ANGIOGRAPHY Left 05/07/2023   Procedure: Lower Extremity Angiography;  Surgeon: Marea Selinda RAMAN, MD;  Location: [facility] INVASIVE CV LAB;  Service: Cardiovascular;  Laterality: Left;   NASAL SEPTOPLASTY W/ TURBINOPLASTY Bilateral 02/08/2023   Procedure: NASAL SEPTOPLASTY WITH TURBINATE REDUCTION;  Surgeon: Edda Mt, MD;  Location: [facility];  Service: ENT;  Laterality: Bilateral;   POLYPECTOMY     PROSTATE BIOPSY   10/10/2019   TOTAL HIP ARTHROPLASTY     Left hip replacement/femur fx 07/04, Screw removal left hip- [facility] 11/01     reports that he has never smoked. He has been exposed to tobacco smoke. He has never used smokeless tobacco. He reports current alcohol  use of about 1.0 standard drink of alcohol  per week. He reports that he does not use drugs.  Allergies  Allergen Reactions   Glipizide Other (See Comments)    REACTION: wt. gain, hands tingling   Lisinopril  Other (See Comments)    HYPERKALEMIA   Ramipril Cough   Ibuprofen     UNSPECIFIED REACTION    Bactrim  [Sulfamethoxazole -Trimethoprim ] Nausea And Vomiting and Rash    Family History  Problem Relation Age of Onset   Diabetes Mother    Diabetes Brother    Alzheimer's disease Maternal Aunt    Alzheimer's disease Cousin    Cancer Neg Hx    Colon cancer Neg Hx    Colon polyps Neg Hx    Rectal cancer Neg Hx    Stomach cancer Neg Hx    Esophageal cancer Neg Hx      Prior to Admission medications   Medication Sig Start Date End Date Taking? Authorizing Provider  amLODipine  (NORVASC ) 5 MG tablet TAKE 1 TABLET(5 MG) BY MOUTH DAILY 02/11/24  Yes Letvak, Richard I, MD  clobetasol cream (TEMOVATE) 0.05 % Apply 1 Application topically 2 (two) times daily. 05/10/24  Yes [provider]  DULoxetine  (CYMBALTA ) 60 MG capsule Take 2 capsules (120 mg total) by mouth daily. 10/29/23  Yes Jimmy Charlie FERNS, MD  gabapentin  (NEURONTIN ) 400 MG capsule Take 2 capsules (800 mg total) by mouth 3 (three) times daily. 11/03/23  Yes Jimmy Charlie FERNS, MD  insulin  glargine (LANTUS  SOLOSTAR) 100 UNIT/ML Solostar Pen Inject 24 Units into the skin daily. 09/06/23  Yes Jimmy Charlie I, MD  losartan  (COZAAR ) 25 MG tablet TAKE 1 TABLET(25 MG) BY MOUTH DAILY 02/11/24  Yes Letvak, Richard I, MD  metFORMIN  (GLUCOPHAGE ) 1000 MG tablet Take 1 tablet (1,000 mg total) by mouth 2 (two) times daily with a meal. 03/05/24  Yes Jimmy Charlie FERNS, MD  omeprazole   (PRILOSEC) 20 MG capsule Take 20 mg by mouth 2 (two) times daily. 05/02/24  Yes [provider]  pantoprazole (PROTONIX) 40 MG tablet Take 1 tablet (40 mg total) by mouth daily. 05/02/24  Yes Vincente Shivers, NP  Alcohol  Swabs  (ALCOHOL  PREP) 70 % PADS USE 1 PREP PAD TWICE DAILY 07/23/23   Jimmy Charlie I, MD  Alpha-Lipoic Acid 600 MG TABS Take by mouth. 3 in am and 1 in pm    [provider]  CALCIUM-MAGNESIUM -ZINC  PO Take by mouth.    [provider]  Cinnamon 500 MG capsule Take 500 mg by mouth 2 (two) times daily.    [provider]  CONTOUR NEXT TEST test strip USE 1 TO 2 TIMES DAILY TO CHECK  BLOOD SUGAR 07/23/23   Jimmy Charlie FERNS, MD  cyanocobalamin  (VITAMIN B12) 1000 MCG/ML injection 1 ml injection-once a month. 04/19/23   Rennie,  Govinda R, MD  dapagliflozin  propanediol (FARXIGA ) 5 MG TABS tablet Take 1 tablet (5 mg total) by mouth daily. 12/04/23   Jimmy Charlie FERNS, MD  finasteride  (PROSCAR ) 5 MG tablet Take 1 tablet (5 mg total) by mouth daily. 04/14/24   Vaillancourt, Samantha, PA-C  furosemide  (LASIX ) 20 MG tablet Take 1 tablet (20 mg total) by mouth daily as needed. 04/29/24   Webb, Padonda B, FNP  hydrocortisone  valerate ointment (WEST-CORT) 0.2 % APPLY TOPICALLY TO AFFECTED  AREA(S) TWICE DAILY 05/25/23   Jimmy Charlie I, MD  hydrOXYzine  (VISTARIL ) 25 MG capsule TAKE 1 CAPSULE(25 MG) BY MOUTH EVERY 8 HOURS AS NEEDED FOR ITCHING 07/19/23   Jimmy Charlie FERNS, MD  Insulin  Pen Needle (B-D UF III MINI PEN NEEDLES) 31G X 5 MM MISC USE AS DIRECTED DAILY 02/04/24   Jimmy Charlie FERNS, MD  montelukast (SINGULAIR) 10 MG tablet Take 10 mg by mouth daily. Patient not taking: Reported on 05/21/2024 05/13/24   [provider]  Multiple Vitamin (MULTIVITAMIN WITH MINERALS) TABS tablet Take 1 tablet by mouth daily. 05/11/23   Darci Pore, MD  Oxycodone  HCl 10 MG TABS Take 1 tablet by mouth 5 (five) times daily.    [provider]   simvastatin  (ZOCOR ) 40 MG tablet Take 1 tablet (40 mg total) by mouth at bedtime. 04/24/24   Cleatus Arlyss RAMAN, MD  SYRINGE-NEEDLE, DISP, 3 ML (BD SAFETYGLIDE SYRINGE/NEEDLE) 25G X 1 3 ML MISC Use the needle for IM injection once a  month; or as directed. 10/31/22   Brahmanday, Govinda R, MD  tamsulosin  (FLOMAX ) 0.4 MG CAPS capsule Take 1 capsule (0.4 mg total) by mouth daily. 04/15/24   Vaillancourt, Samantha, PA-C  tirzepatide  (MOUNJARO ) 5 MG/0.5ML Pen Inject 5 mg into the skin once a week. 04/27/24   Cleatus Arlyss RAMAN, MD  triamcinolone  lotion (KENALOG ) 0.1 % APPLY TO AFFECTED AREA(S)  TOPICALLY 3 TIMES DAILY 07/23/23   Jimmy Charlie FERNS, MD    Physical Exam: Vitals:   05/21/24 0759 05/21/24 0801 05/21/24 1204  BP: 113/62  (!) 140/75  Pulse: 90  89  Resp: 20  17  Temp: 97.6 F (36.4 C)  97.8 F (36.6 C)  TempSrc: Oral  Oral  SpO2: 99%  95%  Weight:  99.8 kg   Height:  6' (1.829 m)     Constitutional: NAD, calm, comfortable Vitals:   05/21/24 0759 05/21/24 0801 05/21/24 1204  BP: 113/62  (!) 140/75  Pulse: 90  89  Resp: 20  17  Temp: 97.6 F (36.4 C)  97.8 F (36.6 C)  TempSrc: Oral  Oral  SpO2: 99%  95%  Weight:  99.8 kg   Height:  6' (1.829 m)    Eyes: PERRL, lids and conjunctivae normal ENMT: Mucous membranes are moist. Posterior pharynx clear of any exudate or lesions.Normal dentition.  Neck: normal, supple, no masses, no thyromegaly Respiratory: clear to auscultation bilaterally, no wheezing, no crackles. Normal respiratory effort. No accessory muscle use.  Cardiovascular: Regular rate and rhythm, no murmurs / rubs / gallops. No extremity edema. 2+ pedal pulses. No carotid bruits.  Abdomen: no tenderness, no masses palpated. No hepatosplenomegaly. Bowel sounds positive.  Musculoskeletal: no clubbing / cyanosis. No joint deformity upper and lower extremities. Good ROM, no contractures. Normal muscle tone.  Skin: Right foot ulcer with purulent discharge on the lateral side  of transmetatarsal amputation stump Neurologic: CN 2-12 grossly intact. Sensation intact, DTR normal. Strength 5/5 in all 4.  Psychiatric: Normal judgment and  insight. Alert and oriented x 3. Normal mood.     Labs on Admission: I have personally reviewed following labs and imaging studies  CBC: Recent Labs  Lab 05/21/24 0803  WBC 14.1*  NEUTROABS 11.9*  HGB 8.9*  HCT 27.9*  MCV 87.5  PLT 427*   Basic Metabolic Panel: Recent Labs  Lab 05/21/24 0803  NA 126*  K 4.5  CL 90*  CO2 22  GLUCOSE 199*  BUN 27*  CREATININE 1.27*  CALCIUM 8.6*   GFR: Estimated Creatinine Clearance: 68.1 mL/min (A) (by C-G formula based on SCr of 1.27 mg/dL (H)). Liver Function Tests: Recent Labs  Lab 05/21/24 0803  AST 50*  ALT 56*  ALKPHOS 211*  BILITOT 0.9  PROT 6.8  ALBUMIN 2.7*   No results for input(s): LIPASE, AMYLASE in the last 168 hours. No results for input(s): AMMONIA in the last 168 hours. Coagulation Profile: No results for input(s): INR, PROTIME in the last 168 hours. Cardiac Enzymes: No results for input(s): CKTOTAL, CKMB, CKMBINDEX, TROPONINI in the last 168 hours. BNP (last 3 results) No results for input(s): PROBNP in the last 8760 hours. HbA1C: No results for input(s): HGBA1C in the last 72 hours. CBG: Recent Labs  Lab 05/21/24 1219  GLUCAP 116*   Lipid Profile: No results for input(s): CHOL, HDL, LDLCALC, TRIG, CHOLHDL, LDLDIRECT in the last 72 hours. Thyroid Function Tests: No results for input(s): TSH, T4TOTAL, FREET4, T3FREE, THYROIDAB in the last 72 hours. Anemia Panel: No results for input(s): VITAMINB12, FOLATE, FERRITIN, TIBC, IRON, RETICCTPCT in the last 72 hours. Urine analysis:    Component Value Date/Time   COLORURINE YELLOW (A) 05/21/2024 0836   APPEARANCEUR HAZY (A) 05/21/2024 0836   APPEARANCEUR Clear 06/27/2022 1511   LABSPEC 1.016 05/21/2024 0836   PHURINE 5.0 05/21/2024 0836    GLUCOSEU >=500 (A) 05/21/2024 0836   HGBUR NEGATIVE 05/21/2024 0836   BILIRUBINUR NEGATIVE 05/21/2024 0836   BILIRUBINUR Negative 06/27/2022 1511   KETONESUR NEGATIVE 05/21/2024 0836   PROTEINUR NEGATIVE 05/21/2024 0836   NITRITE NEGATIVE 05/21/2024 0836   LEUKOCYTESUR NEGATIVE 05/21/2024 0836    Radiological Exams on Admission: US  ARTERIAL ABI (SCREENING LOWER EXTREMITY) Result Date: 05/21/2024 EXAM: VASCULAR SCREENING 05/21/2024 11:55:16 AM CLINICAL HISTORY: Diabetic foot ulcer (HCC) [id number]. TECHNIQUE: The ABIs were also acquired. COMPARISON: None available. FINDINGS: ANKLE BRACHIAL INDEX: Right: 1.26 Left: 1.26 ABIs were within normal range. Multiphasic waveforms reported in the distal calf bilaterally. IMPRESSION: 1. No evidence of hemodynamically significant lower extremity arterial occlusive disease at rest. Electronically signed by: Katheleen Faes MD 05/21/2024 12:22 PM EDT RP Workstation: [id number]   MR FOOT RIGHT WO CONTRAST Result Date: 05/21/2024 EXAM: MRI of the right Foot without contrast. 05/21/2024 11:36:46 AM TECHNIQUE: Multiplanar multisequence MRI of the right foot was performed from the posterior subtalar joint through the stump without the administration of intravenous contrast. COMPARISON: Radiographs 05/21/2024. CLINICAL HISTORY: Right foot swelling, diabetic, suspected osteomyelitis, infection, and fever. FINDINGS: LISFRANC JOINT: Visualized Lisfranc ligament is intact. No significant Lisfranc interval widening or significant periligamentous edema. BONE MARROW: Trace endosteal edema along the distal margins of the 2nd and 3rd, probably reactive and not currently considered specific for osteomyelitis. No acute fracture or aggressive marrow replacing lesion. GREATER AND LESSER MTP JOINTS: No significant joint effusion or osseous erosions. No significant degenerative changes. Normal alignment. SOFT TISSUES: Out distal ulceration along the plantar stump margin as shown on image 18  series 6 likely accounting for the gas density in this vicinity on prior  radiography. Dorsal to the 2nd and 3rd metatarsals, a 4.2 x 1.5 x 1.8 cm fluid collection is present and suspicious for abscess. This is probably spontaneously draining to the skin surface through a branching sinus tract anteriorly on images 13 through 17 of series 6 and speckled low signal internally in the fluid collection may represent gas. Appearance compatible with abscess potentially spontaneously draining. Surrounding subcutaneous edema along the dorsum of the foot may well reflect cellulitis. Regional muscular atrophy. TENDONS: Tibialis posterior, flexor digitorum longus, and flexor hallucis longus tenosynovitis noted. IMPRESSION: 1. Dorsal fluid collection in the soft tissues above the remaining 2nd and 3rd metatarsals (4.2 x 1.5 x 1.8 cm) suspicious for abscess, potentially spontaneously draining to the anterior cutaneous surface , with surrounding subcutaneous edema compatible with cellulitis. 2. Distal ulceration along the plantar stump margin, likely accounting for the gas density seen on prior radiography. 3. Trace endosteal edema along the distal margins of the 2nd and 3rd metatarsals, likely reactive and not specific for osteomyelitis. 4. Tibialis posterior, flexor digitorum longus, and flexor hallucis longus tenosynovitis. 5. Regional muscular atrophy. Electronically signed by: Ryan Salvage MD 05/21/2024 11:50 AM EDT RP Workstation: [id number]   DG Foot Complete Right Result Date: 05/21/2024 EXAM: 3 OR MORE VIEW(S) XRAY OF THE RIGHT FOOT 05/21/2024 08:36:00 AM COMPARISON: 12/21/2016 CLINICAL HISTORY: Foot infection. 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 FINDINGS: BONES AND JOINTS: Transmetatarsal amputation of the first through fifth digits. No current bony erosive or destructive findings to specifically indicate osteomyelitis. Plantar calcaneal spur. No joint dislocation. SOFT TISSUES: Soft tissue  swelling overlying the amputation stump gas density collection along the plantar distal stump compatible with a large ulceration or localized infection involving the soft tissues, correlate with visual inspection. Atheromatous vascular calcifications. IMPRESSION: 1. Soft tissue swelling and gas along the plantar distal stump, compatible with a large ulceration or localized soft tissue infection. 2. No radiographic evidence of osteomyelitis. 3. Atheromatous vascular calcifications. 4. Plantar calcaneal spur. Electronically signed by: Ryan Salvage MD 05/21/2024 09:04 AM EDT RP Workstation: [id number]    EKG: None  Assessment/Plan Principal Problem:   Diabetic foot infection (HCC) Active Problems:   Diabetic foot ulcer with osteomyelitis (HCC)  (please populate well all problems here in Problem List. (For example, if patient is on BP meds at home and you resume or decide to hold them, it is a problem that needs to be her. Same for CAD, COPD, HLD and so on)   right foot diabetic ulcer infection Right foot abscess formation Right foot infective tenosynovitis - Podiatry recommended IV antibiotics, continue vancomycin  and Zosyn .  Send MRSA screening - Arterial Doppler pending - MRI results reviewed, I&D as per podiatry. - Risk of losing right foot is high, communicated with patient about concern.  PVD With chronic claudication - Arterial Doppler pending - Continue statin - Not on aspirin probably due to history of NSAIDs allergy   IDDM with hyperglycemia - Continue Lantus  - SSI  Chronic hyponatremia - Euvolemic - Clinically suspect hyponatremia secondary to alcohol  abuse - Check hyponatremia study - Fluid restriction  Chronic anemia - H&H stable, denied any bleeding symptoms  DVT prophylaxis: Lovenox  subcu Code Status: Full code Family Communication: None at bedside Disposition Plan: Patient is sick with worsening of right foot diabetic ulcer infection involving deep tissue  requiring IV antibiotics and inpatient podiatry intervention, expect more than 2 midnight hospital stay Consults called: Podiatry Admission status: MedSurg admission   Cort ONEIDA Mana MD [facility] Pager [phone] 05/21/2024, 12:41 PM

## 2024-05-21 NOTE — Plan of Care (Signed)
  Problem: Education: Goal: Ability to describe self-care measures that may prevent or decrease complications (Diabetes Survival Skills Education) will improve Outcome: Progressing   Problem: Coping: Goal: Ability to adjust to condition or change in health will improve Outcome: Progressing   Problem: Fluid Volume: Goal: Ability to maintain a balanced intake and output will improve Outcome: Progressing   Problem: Metabolic: Goal: Ability to maintain appropriate glucose levels will improve Outcome: Progressing   Problem: Nutritional: Goal: Maintenance of adequate nutrition will improve Outcome: Progressing   Problem: Skin Integrity: Goal: Risk for impaired skin integrity will decrease Outcome: Progressing   Problem: Tissue Perfusion: Goal: Adequacy of tissue perfusion will improve Outcome: Progressing   Problem: Health Behavior/Discharge Planning: Goal: Ability to manage health-related needs will improve Outcome: Progressing

## 2024-05-21 NOTE — Consult Note (Signed)
 Pharmacy Antibiotic Note  [name] is a 68 y.o. male admitted on 05/21/2024 with a right foot ulcer/wound.  Pharmacy has been consulted for Vancomycin  and Zosyn  dosing.  Plan: Vancomycin  2000 mg IV total x1 as the loading dose, followed by: 1000mg  Q 12 hrs. Goal AUC 400-550. Expected AUC: 505.0 Expected Cmin: 15.7 SCr used: 1.27, Vd used: 0.72   Zosyn  3.375g IV q8h (4 hour infusion).  Height: 6' (182.9 cm) Weight: 99.8 kg (220 lb) IBW/kg (Calculated) : 77.6  Temp (24hrs), Avg:97.7 F (36.5 C), Min:97.6 F (36.4 C), Max:97.8 F (36.6 C)  Recent Labs  Lab 05/21/24 0803  WBC 14.1*  CREATININE 1.27*  LATICACIDVEN 1.6    Estimated Creatinine Clearance: 68.1 mL/min (A) (by C-G formula based on SCr of 1.27 mg/dL (H)).    Allergies  Allergen Reactions   Glipizide Other (See Comments)    REACTION: wt. gain, hands tingling   Lisinopril  Other (See Comments)    HYPERKALEMIA   Ramipril Cough   Ibuprofen     UNSPECIFIED REACTION    Bactrim  [Sulfamethoxazole -Trimethoprim ] Nausea And Vomiting and Rash    Antimicrobials this admission: Vancomycin  10/29 >>  Zosyn  10/29 >>   Dose adjustments this admission: N/A  Microbiology results: 10/29 BCx: collected 10/29 MRSA PCR: ordered  Thank you for allowing pharmacy to be a part of this patient's care.  Madilyn Cephas A Londan Coplen 05/21/2024 12:34 PM

## 2024-05-21 NOTE — ED Notes (Signed)
 NURSE SYDNI INFORMED OF READY BED

## 2024-05-21 NOTE — ED Provider Notes (Signed)
 [facility] Provider Note    Event Date/Time   First MD Initiated Contact with Patient 05/21/24 [phone]     (approximate)   History   Wound Infection   HPI  [name] is a 68 y.o. male past medical history significant for diabetes, diabetic foot wound, who presents to the emergency department with concern for right foot infection.  Patient is followed by Dr. Neill with podiatry who told the patient to come into the emergency department for admission and IV antibiotics.  Patient states that he has been having a fever for the past 3 days.  Worsening redness and swelling to his right foot.  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  Denies any chest pain or shortness of breath.  History of neuropathy.  History of alcohol  use but states his last drink of alcohol  was approximately 1 year ago and this past September.  Denies any abdominal pain or blood in his stool.     Physical Exam   Triage Vital Signs: ED Triage Vitals  Encounter Vitals Group     BP 05/21/24 0759 113/62     Girls Systolic BP Percentile --      Girls Diastolic BP Percentile --      Boys Systolic BP Percentile --      Boys Diastolic BP Percentile --      Pulse Rate 05/21/24 0759 90     Resp 05/21/24 0759 20     Temp 05/21/24 0759 97.6 F (36.4 C)     Temp Source 05/21/24 0759 Oral     SpO2 05/21/24 0759 99 %     Weight 05/21/24 0801 220 lb (99.8 kg)     Height 05/21/24 0801 6' (1.829 m)     Head Circumference --      Peak Flow --      Pain Score 05/21/24 0800 3     Pain Loc --      Pain Education --      Exclude from Growth Chart --     Most recent vital signs: Vitals:   05/21/24 0759  BP: 113/62  Pulse: 90  Resp: 20  Temp: 97.6 F (36.4 C)  SpO2: 99%    Physical Exam Constitutional:      Appearance: He is well-developed.  HENT:     Head: Atraumatic.  Eyes:     Conjunctiva/sclera: Conjunctivae normal.  Cardiovascular:     Rate and Rhythm: Regular rhythm.  Pulmonary:      Effort: No respiratory distress.  Musculoskeletal:     Cervical back: Normal range of motion.     Comments: Erythema warmth and swelling to the right lower extremity.  Palpable pulse.  No obvious wound that probes to bone.  Skin:    General: Skin is warm.     Findings: Rash present.  Neurological:     Mental Status: He is alert. Mental status is at baseline.        IMPRESSION / MDM / ASSESSMENT AND PLAN / ED COURSE  I reviewed the triage vital signs and the nursing notes.  Differential diagnosis including diabetic foot wound, cellulitis, osteomyelitis  No obvious findings concerning for necrotizing soft tissue infection   No tachycardic or bradycardic dysrhythmias while on cardiac telemetry.  RADIOLOGY I independently reviewed imaging, my interpretation of imaging: X-ray of the right foot with soft tissue swelling consistent with infection but no obvious findings of osteomyelitis  LABS (all labs ordered are listed, but only abnormal results  are displayed) Labs interpreted as -    Labs Reviewed  COMPREHENSIVE METABOLIC PANEL WITH GFR - Abnormal; Notable for the following components:      Result Value   Sodium 126 (*)    Chloride 90 (*)    Glucose, Bld 199 (*)    BUN 27 (*)    Creatinine, Ser 1.27 (*)    Calcium 8.6 (*)    Albumin 2.7 (*)    AST 50 (*)    ALT 56 (*)    Alkaline Phosphatase 211 (*)    All other components within normal limits  CBC WITH DIFFERENTIAL/PLATELET - Abnormal; Notable for the following components:   WBC 14.1 (*)    RBC 3.19 (*)    Hemoglobin 8.9 (*)    HCT 27.9 (*)    Platelets 427 (*)    Neutro Abs 11.9 (*)    Abs Immature Granulocytes 0.11 (*)    All other components within normal limits  CULTURE, BLOOD (ROUTINE X 2)  CULTURE, BLOOD (ROUTINE X 2)  LACTIC ACID, PLASMA  LACTIC ACID, PLASMA  URINALYSIS, W/ REFLEX TO CULTURE (INFECTION SUSPECTED)     MDM  Given his fever blood cultures obtained.  Does have leukocytosis of 14.   Lactic acid is normal.  Hemoglobin is 8.9 which is decreased from his baseline but no signs or symptoms of a GI bleed.  Normal platelets today.  Creatinine appears to be at his baseline.  Glucose elevated at 199.  No findings of DKA or hyperosmolar syndrome.  Hyponatremia at 126 which appears to be chronic for the patient.  No obvious findings of osteomyelitis on x-ray imaging.  Started on antibiotics with vancomycin  and Zosyn  to cover for diabetic foot wound with concern for possible osteomyelitis.  Message Dr. Ashley with podiatry.  Plan for admission to hospitalist service.  Will give 1 L of IV fluids given his hyponatremia.  Does not appear volume overloaded.  Dr. Ashley ordered MRI and ABIs with plans to see this afternoon, no plans for surgery today so okay for p.o.     PROCEDURES:  Critical Care performed: yes  .Critical Care  Performed by: Suzanne Kirsch, MD Authorized by: Suzanne Kirsch, MD   Critical care provider statement:    Critical care time (minutes):  30   Critical care time was exclusive of:  Separately billable procedures and treating other patients   Critical care was time spent personally by me on the following activities:  Development of treatment plan with patient or surrogate, discussions with consultants, evaluation of patient's response to treatment, examination of patient, ordering and review of laboratory studies, ordering and review of radiographic studies, ordering and performing treatments and interventions, pulse oximetry, re-evaluation of patient's condition and review of old charts   Patient's presentation is most consistent with acute presentation with potential threat to life or bodily function.   MEDICATIONS ORDERED IN ED: Medications  vancomycin  (VANCOCIN ) IVPB 1000 mg/200 mL premix (has no administration in time range)  sodium chloride  0.9 % bolus 1,000 mL (has no administration in time range)  piperacillin-tazobactam (ZOSYN ) IVPB 3.375 g (3.375 g  Intravenous New Bag/Given 05/21/24 0841)    FINAL CLINICAL IMPRESSION(S) / ED DIAGNOSES   Final diagnoses:  Wound of right foot  Hyponatremia     Rx / DC Orders   ED Discharge Orders     None        Note:  This document was prepared using Dragon voice recognition software and may include unintentional  dictation errors.   Suzanne Kirsch, MD 05/21/24 [phone]

## 2024-05-21 NOTE — ED Triage Notes (Signed)
 Pt to ED POV with wife sent by Dr Fernande for R foot infection and to get IV antibiotics. Wearing sneaker currently. Ambulatory. Wife states temp was 101 last night. States infection has been there since atleast 2 months. Infection is to area where toes used to be and also to plantar surface.

## 2024-05-21 NOTE — Progress Notes (Signed)
   05/21/24 1204  Vitals  Temp 97.8 F (36.6 C)  Temp Source Oral  BP (!) 140/75  MAP (mmHg) 94  BP Method Automatic  Pulse Rate 89  Resp 17  MEWS COLOR  MEWS Score Color Green  Oxygen Therapy  SpO2 95 %  O2 Device Room Air  MEWS Score  MEWS Temp 0  MEWS Systolic 0  MEWS Pulse 0  MEWS RR 0  MEWS LOC 0  MEWS Score 0   Patient admitted from ED alert, room air, with stable vitals. Skin assessment verified with Rod, RN (photos in chart), and wound care consult placed.  Call bell education provided with safety precautions in place.

## 2024-05-21 NOTE — Consult Note (Signed)
 Hospital Consult    Reason for Consult:  Right Lower extremity Wound with infection Requesting Physician:  Dr Eva Gay MD  MRN #:  [id number]  History of Present Illness: This is a 68 y.o. male  who complains of worsening wound and infection to his right foot.  Patient is status post transmetatarsal amputation 05/08/2023.  Presented to the outpatient clinic earlier this week with noted worsening infection to his right foot.  Known history of diabetes with neuropathy.  Patient was recommended admission for further workup and debridement of the right foot.   He was last seen by Vascular surgery here in the office on 09/04/2023. He was healing well at that time.  Arterial ultrasounds and ABIs were completed today on 05/21/2024.  His right brachial index is 1.26 and his left brachial index is 1.26.  He had multiphasic waveforms reported distally in each one of his calfs bilaterally.  Ultimately there is no evidence of any hemodynamically significant arterial occlusive disease at rest.  Upon workup entering the hospital he was noted to have a glucose of 200 and a hemoglobin A1c of 8.4.  He also had elevated BUN and creatinine at 27 and 1.27.  He did have an elevated Avis blood cell count of 14.1 most likely related to the wounds on his right foot.  His hemoglobin and hematocrit were respective at 8.9 and 27.9.  However one significant finding was that his TSH was severely elevated at 10.814.  Vascular surgery was consulted to evaluate.  Past Medical History:  Diagnosis Date   Adenomatous polyp    Allergy     Anemia    Arthritis    feet    Bunion 04/02/2013   STATUS POST OP BUN REPAIR   Diabetes mellitus    Foot ulcer (HCC)    GERD (gastroesophageal reflux disease)    Gout    Hammertoe    Hyperlipidemia    Hypertension    Neuromuscular disorder (HCC)    neuropathy   Plantar flexed metatarsal    Psoriasis    Schatzki's ring     Past Surgical History:  Procedure Laterality Date    AMPUTATION TOE Left 05/08/2023   Procedure: AMPUTATION 5TH TOE WITH PARTIAL RAY RESECTION;  Surgeon: Neill Boas, DPM;  Location: [facility];  Service: Orthopedics/Podiatry;  Laterality: Left;   CARPAL TUNNEL RELEASE Left 08/24/2022   Procedure: CARPAL TUNNEL RELEASE;  Surgeon: Cleotilde Barrio, MD;  Location: [facility];  Service: Orthopedics;  Laterality: Left;   CATARACT EXTRACTION W/PHACO Left 03/21/2023   Procedure: CATARACT EXTRACTION PHACO AND INTRAOCULAR LENS PLACEMENT (IOC) LEFT MALYUGIN DIABETIC 9.88 1:01.4;  Surgeon: Mittie Gaskin, MD;  Location: [facility];  Service: Ophthalmology;  Laterality: Left;   CATARACT EXTRACTION W/PHACO Right 04/04/2023   Procedure: CATARACT EXTRACTION PHACO AND INTRAOCULAR LENS PLACEMENT (IOC) RIGHT MALYUGIN DIABETIC 15.31 01:15.1;  Surgeon: Mittie Gaskin, MD;  Location: [facility];  Service: Ophthalmology;  Laterality: Right;   COLONOSCOPY     FOOT AMPUTATION Right 01/11/2017   Dr Tisa ([facility])--transmetatarsal   FOOT SURGERY Right 02/28/2013   ALLEEN HAM TOE2,3 PINS ,2ND MET OSTEOTOMY, 5TH MET W/SCREW   [phone], 04-2013   HIP FRACTURE SURGERY  1993   surgery x2   IR BONE MARROW BIOPSY & ASPIRATION  04/27/2023   LOWER EXTREMITY ANGIOGRAPHY Left 05/07/2023   Procedure: Lower Extremity Angiography;  Surgeon: Marea Selinda RAMAN, MD;  Location: [facility];  Service: Cardiovascular;  Laterality: Left;   NASAL SEPTOPLASTY W/ TURBINOPLASTY Bilateral  02/08/2023   Procedure: NASAL SEPTOPLASTY WITH TURBINATE REDUCTION;  Surgeon: Edda Mt, MD;  Location: [facility];  Service: ENT;  Laterality: Bilateral;   POLYPECTOMY     PROSTATE BIOPSY  10/10/2019   TOTAL HIP ARTHROPLASTY     Left hip replacement/femur fx 07/04, Screw removal left hip- [facility] 11/01    Allergies  Allergen Reactions   Glipizide Other (See Comments)    REACTION: wt. gain, hands tingling   Lisinopril  Other (See Comments)    HYPERKALEMIA   Ramipril  Cough   Ibuprofen     UNSPECIFIED REACTION    Bactrim  [Sulfamethoxazole -Trimethoprim ] Nausea And Vomiting and Rash    Prior to Admission medications   Medication Sig Start Date End Date Taking? Authorizing Provider  Alcohol  Swabs  (ALCOHOL  PREP) 70 % PADS USE 1 PREP PAD TWICE DAILY 07/23/23  Yes Jimmy Ade I, MD  Alpha-Lipoic Acid 600 MG TABS Take by mouth. 3 in am and 1 in pm   Yes [provider]  amLODipine  (NORVASC ) 5 MG tablet TAKE 1 TABLET(5 MG) BY MOUTH DAILY 02/11/24  Yes Letvak, Richard I, MD  CALCIUM-MAGNESIUM -ZINC  PO Take by mouth.   Yes [provider]  Cinnamon 500 MG capsule Take 500 mg by mouth 2 (two) times daily.   Yes [provider]  clobetasol cream (TEMOVATE) 0.05 % Apply 1 Application topically 2 (two) times daily. 05/10/24  Yes [provider]  CONTOUR NEXT TEST test strip USE 1 TO 2 TIMES DAILY TO CHECK  BLOOD SUGAR 07/23/23  Yes Jimmy Ade FERNS, MD  dapagliflozin  propanediol (FARXIGA ) 5 MG TABS tablet Take 1 tablet (5 mg total) by mouth daily. 12/04/23  Yes Jimmy Ade FERNS, MD  DULoxetine  (CYMBALTA ) 60 MG capsule Take 2 capsules (120 mg total) by mouth daily. 10/29/23  Yes Jimmy Ade FERNS, MD  finasteride  (PROSCAR ) 5 MG tablet Take 1 tablet (5 mg total) by mouth daily. 04/14/24  Yes Vaillancourt, Samantha, PA-C  furosemide  (LASIX ) 20 MG tablet Take 1 tablet (20 mg total) by mouth daily as needed. 04/29/24  Yes Webb, Padonda B, FNP  gabapentin  (NEURONTIN ) 400 MG capsule Take 2 capsules (800 mg total) by mouth 3 (three) times daily. 11/03/23  Yes Letvak, Richard I, MD  hydrocortisone  valerate ointment (WEST-CORT) 0.2 % APPLY TOPICALLY TO AFFECTED  AREA(S) TWICE DAILY 05/25/23  Yes Letvak, Richard I, MD  hydrOXYzine  (VISTARIL ) 25 MG capsule TAKE 1 CAPSULE(25 MG) BY MOUTH EVERY 8 HOURS AS NEEDED FOR ITCHING 07/19/23  Yes Jimmy Ade FERNS, MD  insulin  glargine (LANTUS  SOLOSTAR) 100 UNIT/ML Solostar Pen Inject 24 Units into the skin  daily. 09/06/23  Yes Jimmy Ade FERNS, MD  Insulin  Pen Needle (B-D UF III MINI PEN NEEDLES) 31G X 5 MM MISC USE AS DIRECTED DAILY 02/04/24  Yes Letvak, Richard I, MD  losartan  (COZAAR ) 25 MG tablet TAKE 1 TABLET(25 MG) BY MOUTH DAILY 02/11/24  Yes Letvak, Richard I, MD  metFORMIN  (GLUCOPHAGE ) 1000 MG tablet Take 1 tablet (1,000 mg total) by mouth 2 (two) times daily with a meal. 03/05/24  Yes Jimmy Ade FERNS, MD  Multiple Vitamin (MULTIVITAMIN WITH MINERALS) TABS tablet Take 1 tablet by mouth daily. 05/11/23  Yes Darci Pore, MD  Oxycodone  HCl 10 MG TABS Take 1 tablet by mouth 5 (five) times daily.   Yes [provider]  pantoprazole (PROTONIX) 40 MG tablet Take 1 tablet (40 mg total) by mouth daily. 05/02/24  Yes Vincente Shivers, NP  simvastatin  (ZOCOR ) 40 MG tablet Take 1  tablet (40 mg total) by mouth at bedtime. 04/24/24  Yes Cleatus Arlyss RAMAN, MD  SYRINGE-NEEDLE, DISP, 3 ML (BD SAFETYGLIDE SYRINGE/NEEDLE) 25G X 1 3 ML MISC Use the needle for IM injection once a  month; or as directed. 10/31/22  Yes Brahmanday, Govinda R, MD  tamsulosin  (FLOMAX ) 0.4 MG CAPS capsule Take 1 capsule (0.4 mg total) by mouth daily. 04/15/24  Yes Vaillancourt, Samantha, PA-C  tirzepatide  (MOUNJARO ) 5 MG/0.5ML Pen Inject 5 mg into the skin once a week. 04/27/24  Yes Cleatus Arlyss RAMAN, MD  triamcinolone  lotion (KENALOG ) 0.1 % APPLY TO AFFECTED AREA(S)  TOPICALLY 3 TIMES DAILY 07/23/23  Yes Jimmy Charlie FERNS, MD  cyanocobalamin  (VITAMIN B12) 1000 MCG/ML injection 1 ml injection-once a month. Patient not taking: Reported on 05/21/2024 04/19/23   Brahmanday, Govinda R, MD  montelukast (SINGULAIR) 10 MG tablet Take 10 mg by mouth daily. Patient not taking: Reported on 05/21/2024 05/13/24   [provider]  omeprazole  (PRILOSEC) 20 MG capsule Take 20 mg by mouth 2 (two) times daily. Patient not taking: Reported on 05/21/2024 05/02/24   [provider]    Social History   Socioeconomic History    Marital status: Married    Spouse name: Not on file   Number of children: 3   Years of education: Not on file   Highest education level: 12th grade  Occupational History   Occupation: PC ADMINISTRATOR    Employer: [employer]    Comment: Retired 3/25  Tobacco Use   Smoking status: Never    Passive exposure: Past   Smokeless tobacco: Never  Vaping Use   Vaping status: Never Used  Substance and Sexual Activity   Alcohol  use: Yes    Alcohol /week: 1.0 standard drink of alcohol     Types: 1 Standard drinks or equivalent per week    Comment: occasional (DWI 1991)   Drug use: No   Sexual activity: Yes    Birth control/protection: None  Other Topics Concern   Not on file  Social History Narrative   alcohol  2-3 times a week; never smoked; lives in [place]. With wife; and 2 dog.        No living will   Wife would be health care POA---no clear alternate   Would accept resuscitation   No feeding tube if cognitively unaware   Social Drivers of Health   Financial Resource Strain: Low Risk  (05/13/2024)   Received from [facility]   Overall Financial Resource Strain (CARDIA)    Difficulty of Paying Living Expenses: Not hard at all  Food Insecurity: No Food Insecurity (05/21/2024)   Hunger Vital Sign    Worried About Running Out of Food in the Last Year: Never true    Ran Out of Food in the Last Year: Never true  Transportation Needs: No Transportation Needs (05/21/2024)   PRAPARE - Administrator, Civil Service (Medical): No    Lack of Transportation (Non-Medical): No  Physical Activity: Inactive (03/03/2024)   Exercise Vital Sign    Days of Exercise per Week: 0 days    Minutes of Exercise per Session: Not on file  Stress: No Stress Concern Present (03/03/2024)   [employer] of Occupational Health - Occupational Stress Questionnaire    Feeling of Stress: Only a little  Social Connections: Unknown (05/21/2024)   Social Connection and Isolation  Panel    Frequency of Communication with Friends and Family: Twice a week    Frequency of Social Gatherings with  Friends and Family: Patient declined    Attends Religious Services: More than 4 times per year    Active Member of Clubs or Organizations: No    Attends Engineer, Structural: Not on file    Marital Status: Married  Catering Manager Violence: Not At Risk (05/21/2024)   Humiliation, Afraid, Rape, and Kick questionnaire    Fear of Current or Ex-Partner: No    Emotionally Abused: No    Physically Abused: No    Sexually Abused: No     Family History  Problem Relation Age of Onset   Diabetes Mother    Diabetes Brother    Alzheimer's disease Maternal Aunt    Alzheimer's disease Cousin    Cancer Neg Hx    Colon cancer Neg Hx    Colon polyps Neg Hx    Rectal cancer Neg Hx    Stomach cancer Neg Hx    Esophageal cancer Neg Hx     ROS: Otherwise negative unless mentioned in HPI  Physical Examination  Vitals:   05/21/24 0759 05/21/24 1204  BP: 113/62 (!) 140/75  Pulse: 90 89  Resp: 20 17  Temp: 97.6 F (36.4 C) 97.8 F (36.6 C)  SpO2: 99% 95%   Body mass index is 29.84 kg/m.  General:  WDWN in NAD Gait: Not observed HENT: WNL, normocephalic Pulmonary: normal non-labored breathing, without Rales, rhonchi,  wheezing Cardiac: regular, without  Murmurs, rubs or gallops; without carotid bruits Abdomen: Positive bowel sounds throughout, soft, NT/ND, no masses Skin: without rashes Vascular Exam/Pulses: Palpable upper extremity pulses with strong positive doppler pulses bilaterally at the DP/PT. All extremities are warm to touch.  Extremities: with ischemic changes, without Gangrene , without cellulitis; with open wounds;  Musculoskeletal: no muscle wasting or atrophy  Neurologic: A&O X 3;  No focal weakness or paresthesias are detected; speech is fluent/normal Psychiatric:  The pt has Normal affect. Lymph:  Unremarkable  CBC    Component Value Date/Time    WBC 14.1 (H) 05/21/2024 0803   RBC 3.19 (L) 05/21/2024 0803   HGB 8.9 (L) 05/21/2024 0803   HGB 11.2 (L) 09/06/2023 0856   HCT 27.9 (L) 05/21/2024 0803   HCT 34.4 (L) 09/06/2023 0856   PLT 427 (H) 05/21/2024 0803   PLT 314 09/06/2023 0856   MCV 87.5 05/21/2024 0803   MCV 88 09/06/2023 0856   MCH 27.9 05/21/2024 0803   MCHC 31.9 05/21/2024 0803   RDW 14.7 05/21/2024 0803   RDW 12.8 09/06/2023 0856   LYMPHSABS 1.2 05/21/2024 0803   LYMPHSABS 1.3 06/06/2023 0831   MONOABS 0.8 05/21/2024 0803   EOSABS 0.0 05/21/2024 0803   EOSABS 0.4 06/06/2023 0831   BASOSABS 0.1 05/21/2024 0803   BASOSABS 0.0 06/06/2023 0831    BMET    Component Value Date/Time   NA 126 (L) 05/21/2024 0803   NA 137 09/06/2023 0856   K 4.5 05/21/2024 0803   CL 90 (L) 05/21/2024 0803   CO2 22 05/21/2024 0803   GLUCOSE 199 (H) 05/21/2024 0803   BUN 27 (H) 05/21/2024 0803   BUN 13 09/06/2023 0856   CREATININE 1.27 (H) 05/21/2024 0803   CREATININE 1.23 01/03/2013 1212   CALCIUM 8.6 (L) 05/21/2024 0803   GFRNONAA >60 05/21/2024 0803   GFRNONAA >60 01/03/2013 1212   GFRAA 95 03/25/2020 0755   GFRAA >60 01/03/2013 1212    COAGS: No results found for: INR, PROTIME   Non-Invasive Vascular Imaging:   EXAM: MRI of the right  Foot without contrast. 05/21/2024 11:36:46 AM   TECHNIQUE: Multiplanar multisequence MRI of the right foot was performed from the posterior subtalar joint through the stump without the administration of intravenous contrast.   COMPARISON: Radiographs 05/21/2024.   CLINICAL HISTORY: Right foot swelling, diabetic, suspected osteomyelitis, infection, and fever.   FINDINGS:   LISFRANC JOINT: Visualized Lisfranc ligament is intact. No significant Lisfranc interval widening or significant periligamentous edema.   BONE MARROW: Trace endosteal edema along the distal margins of the 2nd and 3rd, probably reactive and not currently considered specific for osteomyelitis. No  acute fracture or aggressive marrow replacing lesion.   GREATER AND LESSER MTP JOINTS: No significant joint effusion or osseous erosions. No significant degenerative changes. Normal alignment.   SOFT TISSUES: Out distal ulceration along the plantar stump margin as shown on image 18 series 6 likely accounting for the gas density in this vicinity on prior radiography. Dorsal to the 2nd and 3rd metatarsals, a 4.2 x 1.5 x 1.8 cm fluid collection is present and suspicious for abscess. This is probably spontaneously draining to the skin surface through a branching sinus tract anteriorly on images 13 through 17 of series 6 and speckled low signal internally in the fluid collection may represent gas. Appearance compatible with abscess potentially spontaneously draining. Surrounding subcutaneous edema along the dorsum of the foot may well reflect cellulitis. Regional muscular atrophy.   TENDONS: Tibialis posterior, flexor digitorum longus, and flexor hallucis longus tenosynovitis noted.   IMPRESSION: 1. Dorsal fluid collection in the soft tissues above the remaining 2nd and 3rd metatarsals (4.2 x 1.5 x 1.8 cm) suspicious for abscess, potentially spontaneously draining to the anterior cutaneous surface , with surrounding subcutaneous edema compatible with cellulitis. 2. Distal ulceration along the plantar stump margin, likely accounting for the gas density seen on prior radiography. 3. Trace endosteal edema along the distal margins of the 2nd and 3rd metatarsals, likely reactive and not specific for osteomyelitis. 4. Tibialis posterior, flexor digitorum longus, and flexor hallucis longus tenosynovitis. 5. Regional muscular atrophy      EXAM: VASCULAR SCREENING 05/21/2024 11:55:16 AM   CLINICAL HISTORY: Diabetic foot ulcer (HCC) [id number].   TECHNIQUE: The ABIs were also acquired.   COMPARISON: None available.   FINDINGS:  ANKLE BRACHIAL INDEX: Right: 1.26    Left: 1.26 ABIs were within normal range. Multiphasic waveforms reported in the distal calf bilaterally.   IMPRESSION: 1. No evidence of hemodynamically significant lower extremity arterial occlusive disease at rest.  Statin:  Yes.   Beta Blocker:  No. Aspirin:  No. ACEI:  No. ARB:  Yes.   CCB use:  Yes Other antiplatelets/anticoagulants:  No.    ASSESSMENT/PLAN: This is a 68 y.o. male with a history of noncompliant diabetes mellitus 2, hemoglobin A1c of 8.4. ,  PAD with right lower extremity transmetatarsal amputation from October 2024.  Patient presented to the wound clinic earlier this week for noted worsening infection of his right foot.  He was recommended to come to the emergency room for admission and further workup possible debridement of his right foot.  Podiatry was consulted and the patient was seen by Dr. Eva Gay.  He plans on taking the patient to the operating room on Friday, 05/23/2024 for debridement the wounds of his right foot.  Vascular surgery was consulted to evaluate.  Upon evaluation patient had bilateral lower extremity arterial ultrasounds with ABIs done today.  He has excellent ABIs and both bilateral lower extremities reading 1.26.  He has very strong Doppler pulses  to both DP and PT on the right and left extremities.  However he does have nonhealing wounds to his right foot that is previously had a TMA.  Because of his ultrasound results and my examination I recommend that the patient continue to surgery on Friday as planned with Dr. Ashley for debridement.  Also recommend long-term antibiotics for treatment of infection.  I also recommend diabetic counseling and teaching with adjustments to his diabetes medications for better control of his blood sugars.  At this time I believe this is all small vessel disease due to uncontrolled noncompliant diabetes II.  If Dr. Ashley feels that the patient does not have enough blood flow to his foot due to the small  vessel disease during his debridement we can discuss the possibility of an angiogram to his right lower extremity next week or proceed to a right below the knee amputation.  If this is truly vascular small vessel disease due to his uncontrolled diabetes an angiogram with possible intervention is not going to help in healing his prior TMA and therefore he would require a below the knee amputation.  Vascular surgery will wait to speak with Dr. Eva Ashley from podiatry after his procedure is completed to determine the next step in the patient's care.   -I discussed the case with Dr. Selinda Gu MD and he agrees with the plan.   Gwendlyn JONELLE Shank [facility] 05/21/2024 1:55 PM

## 2024-05-21 NOTE — Consult Note (Signed)
 ORTHOPAEDIC CONSULTATION  REQUESTING PHYSICIAN: Laurita Cort DASEN, MD  Chief Complaint: Diabetic foot infection right foot  HPI: [name] is a 68 y.o. male who complains of worsening wound and infection to his right foot.  Patient is status post transmetatarsal amputation.  Presented to the outpatient clinic earlier this week with noted worsening infection to his right foot.  Known history of diabetes with neuropathy.  Patient was recommended admission for further workup and debridement of the right foot.  Past Medical History:  Diagnosis Date   Adenomatous polyp    Allergy     Anemia    Arthritis    feet    Bunion 04/02/2013   STATUS POST OP BUN REPAIR   Diabetes mellitus    Foot ulcer (HCC)    GERD (gastroesophageal reflux disease)    Gout    Hammertoe    Hyperlipidemia    Hypertension    Neuromuscular disorder (HCC)    neuropathy   Plantar flexed metatarsal    Psoriasis    Schatzki's ring    Past Surgical History:  Procedure Laterality Date   AMPUTATION TOE Left 05/08/2023   Procedure: AMPUTATION 5TH TOE WITH PARTIAL RAY RESECTION;  Surgeon: Neill Boas, DPM;  Location: [facility];  Service: Orthopedics/Podiatry;  Laterality: Left;   CARPAL TUNNEL RELEASE Left 08/24/2022   Procedure: CARPAL TUNNEL RELEASE;  Surgeon: Cleotilde Barrio, MD;  Location: [facility];  Service: Orthopedics;  Laterality: Left;   CATARACT EXTRACTION W/PHACO Left 03/21/2023   Procedure: CATARACT EXTRACTION PHACO AND INTRAOCULAR LENS PLACEMENT (IOC) LEFT MALYUGIN DIABETIC 9.88 1:01.4;  Surgeon: Mittie Gaskin, MD;  Location: [facility];  Service: Ophthalmology;  Laterality: Left;   CATARACT EXTRACTION W/PHACO Right 04/04/2023   Procedure: CATARACT EXTRACTION PHACO AND INTRAOCULAR LENS PLACEMENT (IOC) RIGHT MALYUGIN DIABETIC 15.31 01:15.1;  Surgeon: Mittie Gaskin, MD;  Location: [facility];  Service: Ophthalmology;  Laterality: Right;   COLONOSCOPY     FOOT AMPUTATION Right  01/11/2017   Dr Tisa ([facility])--transmetatarsal   FOOT SURGERY Right 02/28/2013   ALLEEN HAM TOE2,3 PINS ,2ND MET OSTEOTOMY, 5TH MET W/SCREW   [phone], 04-2013   HIP FRACTURE SURGERY  1993   surgery x2   IR BONE MARROW BIOPSY & ASPIRATION  04/27/2023   LOWER EXTREMITY ANGIOGRAPHY Left 05/07/2023   Procedure: Lower Extremity Angiography;  Surgeon: Marea Selinda RAMAN, MD;  Location: [facility];  Service: Cardiovascular;  Laterality: Left;   NASAL SEPTOPLASTY W/ TURBINOPLASTY Bilateral 02/08/2023   Procedure: NASAL SEPTOPLASTY WITH TURBINATE REDUCTION;  Surgeon: Edda Mt, MD;  Location: [facility];  Service: ENT;  Laterality: Bilateral;   POLYPECTOMY     PROSTATE BIOPSY  10/10/2019   TOTAL HIP ARTHROPLASTY     Left hip replacement/femur fx 07/04, Screw removal left hip- [facility] 11/01   Social History   Socioeconomic History   Marital status: Married    Spouse name: Not on file   Number of children: 3   Years of education: Not on file   Highest education level: 12th grade  Occupational History   Occupation: PC ADMINISTRATOR    Employer: [employer]    Comment: Retired 3/25  Tobacco Use   Smoking status: Never    Passive exposure: Past   Smokeless tobacco: Never  Vaping Use   Vaping status: Never Used  Substance and Sexual Activity   Alcohol  use: Yes    Alcohol /week: 1.0 standard drink of alcohol     Types: 1 Standard drinks or equivalent per week  Comment: occasional (DWI 1991)   Drug use: No   Sexual activity: Yes    Birth control/protection: None  Other Topics Concern   Not on file  Social History Narrative   alcohol  2-3 times a week; never smoked; lives in [place]. With wife; and 2 dog.        No living will   Wife would be health care POA---no clear alternate   Would accept resuscitation   No feeding tube if cognitively unaware   Social Drivers of Health   Financial Resource Strain: Low Risk  (05/13/2024)   Received from [facility]   Overall Financial Resource Strain (CARDIA)    Difficulty of Paying Living Expenses: Not hard at all  Food Insecurity: No Food Insecurity (05/21/2024)   Hunger Vital Sign    Worried About Running Out of Food in the Last Year: Never true    Ran Out of Food in the Last Year: Never true  Transportation Needs: No Transportation Needs (05/21/2024)   PRAPARE - Administrator, Civil Service (Medical): No    Lack of Transportation (Non-Medical): No  Physical Activity: Inactive (03/03/2024)   Exercise Vital Sign    Days of Exercise per Week: 0 days    Minutes of Exercise per Session: Not on file  Stress: No Stress Concern Present (03/03/2024)   [employer] of Occupational Health - Occupational Stress Questionnaire    Feeling of Stress: Only a little  Social Connections: Unknown (05/21/2024)   Social Connection and Isolation Panel    Frequency of Communication with Friends and Family: Twice a week    Frequency of Social Gatherings with Friends and Family: Patient declined    Attends Religious Services: More than 4 times per year    Active Member of [employer] or Organizations: No    Attends Engineer, Structural: Not on file    Marital Status: Married   Family History  Problem Relation Age of Onset   Diabetes Mother    Diabetes Brother    Alzheimer's disease Maternal Aunt    Alzheimer's disease Cousin    Cancer Neg Hx    Colon cancer Neg Hx    Colon polyps Neg Hx    Rectal cancer Neg Hx    Stomach cancer Neg Hx    Esophageal cancer Neg Hx    Allergies  Allergen Reactions   Glipizide Other (See Comments)    REACTION: wt. gain, hands tingling   Lisinopril  Other (See Comments)    HYPERKALEMIA   Ramipril Cough   Ibuprofen     UNSPECIFIED REACTION    Bactrim  [Sulfamethoxazole -Trimethoprim ] Nausea And Vomiting and Rash   Prior to Admission medications   Medication Sig Start Date End Date Taking? Authorizing Provider  Alcohol  Swabs  (ALCOHOL  PREP) 70 %  PADS USE 1 PREP PAD TWICE DAILY 07/23/23  Yes Jimmy Ade I, MD  Alpha-Lipoic Acid 600 MG TABS Take by mouth. 3 in am and 1 in pm   Yes [provider]  amLODipine  (NORVASC ) 5 MG tablet TAKE 1 TABLET(5 MG) BY MOUTH DAILY 02/11/24  Yes Letvak, Richard I, MD  CALCIUM-MAGNESIUM -ZINC  PO Take by mouth.   Yes [provider]  Cinnamon 500 MG capsule Take 500 mg by mouth 2 (two) times daily.   Yes [provider]  clobetasol cream (TEMOVATE) 0.05 % Apply 1 Application topically 2 (two) times daily. 05/10/24  Yes [provider]  CONTOUR NEXT TEST test strip USE 1 TO 2 TIMES  DAILY TO CHECK  BLOOD SUGAR 07/23/23  Yes Jimmy Charlie FERNS, MD  dapagliflozin  propanediol (FARXIGA ) 5 MG TABS tablet Take 1 tablet (5 mg total) by mouth daily. 12/04/23  Yes Jimmy Charlie FERNS, MD  DULoxetine  (CYMBALTA ) 60 MG capsule Take 2 capsules (120 mg total) by mouth daily. 10/29/23  Yes Jimmy Charlie FERNS, MD  finasteride  (PROSCAR ) 5 MG tablet Take 1 tablet (5 mg total) by mouth daily. 04/14/24  Yes Vaillancourt, Samantha, PA-C  furosemide  (LASIX ) 20 MG tablet Take 1 tablet (20 mg total) by mouth daily as needed. 04/29/24  Yes Webb, Padonda B, FNP  gabapentin  (NEURONTIN ) 400 MG capsule Take 2 capsules (800 mg total) by mouth 3 (three) times daily. 11/03/23  Yes Letvak, Richard I, MD  hydrocortisone  valerate ointment (WEST-CORT) 0.2 % APPLY TOPICALLY TO AFFECTED  AREA(S) TWICE DAILY 05/25/23  Yes Letvak, Richard I, MD  hydrOXYzine  (VISTARIL ) 25 MG capsule TAKE 1 CAPSULE(25 MG) BY MOUTH EVERY 8 HOURS AS NEEDED FOR ITCHING 07/19/23  Yes Jimmy Charlie FERNS, MD  insulin  glargine (LANTUS  SOLOSTAR) 100 UNIT/ML Solostar Pen Inject 24 Units into the skin daily. 09/06/23  Yes Jimmy Charlie FERNS, MD  Insulin  Pen Needle (B-D UF III MINI PEN NEEDLES) 31G X 5 MM MISC USE AS DIRECTED DAILY 02/04/24  Yes Jimmy Charlie FERNS, MD  losartan  (COZAAR ) 25 MG tablet TAKE 1 TABLET(25 MG) BY MOUTH DAILY 02/11/24  Yes Letvak,  Richard I, MD  metFORMIN  (GLUCOPHAGE ) 1000 MG tablet Take 1 tablet (1,000 mg total) by mouth 2 (two) times daily with a meal. 03/05/24  Yes Jimmy Charlie FERNS, MD  Multiple Vitamin (MULTIVITAMIN WITH MINERALS) TABS tablet Take 1 tablet by mouth daily. 05/11/23  Yes Darci Pore, MD  Oxycodone  HCl 10 MG TABS Take 1 tablet by mouth 5 (five) times daily.   Yes [provider]  pantoprazole (PROTONIX) 40 MG tablet Take 1 tablet (40 mg total) by mouth daily. 05/02/24  Yes Vincente Shivers, NP  simvastatin  (ZOCOR ) 40 MG tablet Take 1 tablet (40 mg total) by mouth at bedtime. 04/24/24  Yes Cleatus Arlyss RAMAN, MD  SYRINGE-NEEDLE, DISP, 3 ML (BD SAFETYGLIDE SYRINGE/NEEDLE) 25G X 1 3 ML MISC Use the needle for IM injection once a  month; or as directed. 10/31/22  Yes Brahmanday, Govinda R, MD  tamsulosin  (FLOMAX ) 0.4 MG CAPS capsule Take 1 capsule (0.4 mg total) by mouth daily. 04/15/24  Yes Vaillancourt, Samantha, PA-C  tirzepatide  (MOUNJARO ) 5 MG/0.5ML Pen Inject 5 mg into the skin once a week. 04/27/24  Yes Cleatus Arlyss RAMAN, MD  triamcinolone  lotion (KENALOG ) 0.1 % APPLY TO AFFECTED AREA(S)  TOPICALLY 3 TIMES DAILY 07/23/23  Yes Jimmy Charlie FERNS, MD  cyanocobalamin  (VITAMIN B12) 1000 MCG/ML injection 1 ml injection-once a month. Patient not taking: Reported on 05/21/2024 04/19/23   Brahmanday, Govinda R, MD  montelukast (SINGULAIR) 10 MG tablet Take 10 mg by mouth daily. Patient not taking: Reported on 05/21/2024 05/13/24   [provider]  omeprazole  (PRILOSEC) 20 MG capsule Take 20 mg by mouth 2 (two) times daily. Patient not taking: Reported on 05/21/2024 05/02/24   [provider]   US  ARTERIAL ABI (SCREENING LOWER EXTREMITY) Result Date: 05/21/2024 EXAM: VASCULAR SCREENING 05/21/2024 11:55:16 AM CLINICAL HISTORY: Diabetic foot ulcer (HCC) [id number]. TECHNIQUE: The ABIs were also acquired. COMPARISON: None available. FINDINGS: ANKLE BRACHIAL INDEX: Right: 1.26 Left: 1.26 ABIs  were within normal range. Multiphasic waveforms reported in the distal calf bilaterally. IMPRESSION: 1. No evidence of hemodynamically significant lower extremity  arterial occlusive disease at rest. Electronically signed by: Katheleen Faes MD 05/21/2024 12:22 PM EDT RP Workstation: [id number]   MR FOOT RIGHT WO CONTRAST Result Date: 05/21/2024 EXAM: MRI of the right Foot without contrast. 05/21/2024 11:36:46 AM TECHNIQUE: Multiplanar multisequence MRI of the right foot was performed from the posterior subtalar joint through the stump without the administration of intravenous contrast. COMPARISON: Radiographs 05/21/2024. CLINICAL HISTORY: Right foot swelling, diabetic, suspected osteomyelitis, infection, and fever. FINDINGS: LISFRANC JOINT: Visualized Lisfranc ligament is intact. No significant Lisfranc interval widening or significant periligamentous edema. BONE MARROW: Trace endosteal edema along the distal margins of the 2nd and 3rd, probably reactive and not currently considered specific for osteomyelitis. No acute fracture or aggressive marrow replacing lesion. GREATER AND LESSER MTP JOINTS: No significant joint effusion or osseous erosions. No significant degenerative changes. Normal alignment. SOFT TISSUES: Out distal ulceration along the plantar stump margin as shown on image 18 series 6 likely accounting for the gas density in this vicinity on prior radiography. Dorsal to the 2nd and 3rd metatarsals, a 4.2 x 1.5 x 1.8 cm fluid collection is present and suspicious for abscess. This is probably spontaneously draining to the skin surface through a branching sinus tract anteriorly on images 13 through 17 of series 6 and speckled low signal internally in the fluid collection may represent gas. Appearance compatible with abscess potentially spontaneously draining. Surrounding subcutaneous edema along the dorsum of the foot may well reflect cellulitis. Regional muscular atrophy. TENDONS: Tibialis posterior,  flexor digitorum longus, and flexor hallucis longus tenosynovitis noted. IMPRESSION: 1. Dorsal fluid collection in the soft tissues above the remaining 2nd and 3rd metatarsals (4.2 x 1.5 x 1.8 cm) suspicious for abscess, potentially spontaneously draining to the anterior cutaneous surface , with surrounding subcutaneous edema compatible with cellulitis. 2. Distal ulceration along the plantar stump margin, likely accounting for the gas density seen on prior radiography. 3. Trace endosteal edema along the distal margins of the 2nd and 3rd metatarsals, likely reactive and not specific for osteomyelitis. 4. Tibialis posterior, flexor digitorum longus, and flexor hallucis longus tenosynovitis. 5. Regional muscular atrophy. Electronically signed by: Ryan Salvage MD 05/21/2024 11:50 AM EDT RP Workstation: [id number]   DG Foot Complete Right Result Date: 05/21/2024 EXAM: 3 OR MORE VIEW(S) XRAY OF THE RIGHT FOOT 05/21/2024 08:36:00 AM COMPARISON: 12/21/2016 CLINICAL HISTORY: Foot infection. 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 FINDINGS: BONES AND JOINTS: Transmetatarsal amputation of the first through fifth digits. No current bony erosive or destructive findings to specifically indicate osteomyelitis. Plantar calcaneal spur. No joint dislocation. SOFT TISSUES: Soft tissue swelling overlying the amputation stump gas density collection along the plantar distal stump compatible with a large ulceration or localized infection involving the soft tissues, correlate with visual inspection. Atheromatous vascular calcifications. IMPRESSION: 1. Soft tissue swelling and gas along the plantar distal stump, compatible with a large ulceration or localized soft tissue infection. 2. No radiographic evidence of osteomyelitis. 3. Atheromatous vascular calcifications. 4. Plantar calcaneal spur. Electronically signed by: Ryan Salvage MD 05/21/2024 09:04 AM EDT RP Workstation: [id number]    Positive ROS: All  other systems have been reviewed and were otherwise negative with the exception of those mentioned in the HPI and as above.  12 point ROS was performed.  Physical Exam: General: Alert and oriented.  No apparent distress.  Vascular:  Left foot:Dorsalis Pedis:  diminished Posterior Tibial:  diminished  Right foot: Dorsalis Pedis:  absent Posterior Tibial:  diminished  Neuro:absent protective sensation  Derm: Noted large ulceration to the  plantar aspect of the transmetatarsal amputation site on the right forefoot region.  2 areas of necrotic tissue to the anterior aspect of the TMA site as well.  Purulent drainage was noted in the outpatient clinic.  A wound culture had been performed.  Ortho/MS: Status post right foot transmetatarsal amputation  Assessment: Infected diabetic foot ulcer right foot Diabetes with neuropathy History of transmetatarsal amputation History of peripheral vascular disease with left lower extremity stenting  Plan: MRI has been ordered.  Awaiting results of this.  Patient will need further debridement to this right foot for removal of infected tissue likely further bone removal and attempted closure to the wound.  I discussed with the patient the need for tendo Achilles lengthening as he has a noted equinus contracture that causing pressure to the forefoot as well.  ABIs were evaluated by myself and are somewhat encouraging.  I have asked vascular surgery opinion in regards to his circulation.  He has undergone stenting on his left lower extremity in the past.  Will follow-up tomorrow with surgery to discuss MRI results and plan for this coming Friday for debridement.    Ashley Eva LABOR, DPM Cell [phone]   05/21/2024 1:49 PM

## 2024-05-21 NOTE — Consult Note (Signed)
 WOC Nurse Consult Note: Reason for Consult:  S/P transmetatarsal amputation of right foot.  Right foot neuropathic.  Charcots joint arthropathy.  Dr Ashley following and has ordered MRI and ABIs.  Will see later today. Is on vancomycin  and Zosyn .  Is febrile Wound type: Infectious, surgical  Pressure Injury POA: NA Measurement:Plantar foot, right:  4 cm x 6 cm x 0.2 cm with circumferential callous Right transmetatarsal amputation surgical line:  2 cm x 5.5 cm with some devitalized tissue to wound bed.  Wound bed:see above Drainage (amount, consistency, odor)  minimal serosanguinous   Periwound: Erythema to right foot  Dressing procedure/placement/frequency: Cleanse right foot amputation site and plantar foot wound with NS and pat dry. Apply Xeroform gauze to wound bed.  COver with dry gauze and kerlix/tape.  Change daily.   Podiatry consult pending and those orders would supercede the WOC team recommendations.  Will not follow at this time.  Please re-consult if needed.  Darice Cooley MSN, RN, FNP-BC CWON Wound, Ostomy, Continence Nurse [facility] [phone] Work cell phone:  [phone]

## 2024-05-22 DIAGNOSIS — L089 Local infection of the skin and subcutaneous tissue, unspecified: Secondary | ICD-10-CM | POA: Diagnosis not present

## 2024-05-22 DIAGNOSIS — E11628 Type 2 diabetes mellitus with other skin complications: Secondary | ICD-10-CM | POA: Diagnosis not present

## 2024-05-22 LAB — HIV ANTIBODY (ROUTINE TESTING W REFLEX): HIV Screen 4th Generation wRfx: NONREACTIVE

## 2024-05-22 LAB — BASIC METABOLIC PANEL WITH GFR
Anion gap: 13 (ref 5–15)
BUN: 20 mg/dL (ref 8–23)
CO2: 21 mmol/L — ABNORMAL LOW (ref 22–32)
Calcium: 8.2 mg/dL — ABNORMAL LOW (ref 8.9–10.3)
Chloride: 95 mmol/L — ABNORMAL LOW (ref 98–111)
Creatinine, Ser: 1.12 mg/dL (ref 0.61–1.24)
GFR, Estimated: >60 mL/min (ref >60)
Glucose, Bld: 128 mg/dL — ABNORMAL HIGH (ref 70–99)
Potassium: 4.5 mmol/L (ref 3.5–5.1)
Sodium: 129 mmol/L — ABNORMAL LOW (ref 135–145)

## 2024-05-22 LAB — T4, FREE: Free T4: 0.94 ng/dL (ref 0.61–1.12)

## 2024-05-22 LAB — GLUCOSE, CAPILLARY
Glucose-Capillary: 133 mg/dL — ABNORMAL HIGH (ref 70–99)
Glucose-Capillary: 122 mg/dL — ABNORMAL HIGH (ref 70–99)
Glucose-Capillary: 138 mg/dL — ABNORMAL HIGH (ref 70–99)
Glucose-Capillary: 173 mg/dL — ABNORMAL HIGH (ref 70–99)

## 2024-05-22 LAB — MRSA NEXT GEN BY PCR, NASAL: MRSA by PCR Next Gen: NOT DETECTED

## 2024-05-22 MED ORDER — CHLORHEXIDINE GLUCONATE 4 % EX SOLN: 60.0000 mL | Freq: Once | CUTANEOUS | Status: DC

## 2024-05-22 MED ORDER — CHLORHEXIDINE GLUCONATE 4 % EX SOLN
60.0000 mL | Freq: Once | CUTANEOUS | Status: AC
  Administered 2024-05-23: 4 via TOPICAL

## 2024-05-22 NOTE — Progress Notes (Signed)
 PROGRESS NOTE    [name]   FMW:[id number] DOB: March 08, 1956  DOA: 05/21/2024 Date of Service: 05/22/24 which is hospital day 1  PCP: Jimmy Charlie FERNS, MD    Hospital course / significant events:   [name] is a 68 y.o. male with medical history significant of IDDM, diabetic foot infection status post right transmetatarsal amputation, HTN, HLD, PVD, alcohol  abuse, psoriasis, Schatzki's ring, diabetic neuropathy, sent from podiatrist office for evaluation of worsening of right foot infection. Patient has a chronic right foot ulcer developed 2 to 3 months ago, for which he has been followed by outpatient wound care and podiatry. This time he started noticed increasing rash and swelling of the right foot, and started to have low-grade fever last 2 days with Tmax 101 last night. He went to see podiatry 10/29 who sent patient to ED   10/29: MRI showed right foot abscess and tenosynovitis but no signs of osteomyelitis - admitted to hospitalist. Vascular consult - would proceed w/ transmetatarsal amputation but low threshold for angio +/- BKA next week if concern for severe small vessel disease / nonhealing  10/30: stable, await surgery tomorrow, remain on IV abx      Consultants:  Podiatry Vascular surgery   Procedures/Surgeries: none      ASSESSMENT & PLAN:   Right foot diabetic ulcer infection - sepsis RULED OUT Right foot abscess formation Right foot infective tenosynovitis PAD with right lower extremity transmetatarsal amputation from October 2024.  vancomycin  and Zosyn  Plan I&D per podiatry. Follow fultures    PVD/PAD Chronic claudication HLD Arterial Doppler no concerns Vascular consult - more concern for small vessel disease, can get angio if surgical intervention gives any concern for insufficient blood flow, and would consider for BKA  Continue statin Not on aspirin probably due to history of NSAIDs allergy    IDDM with hyperglycemia Hx nonadherence to  recommended therapy  Lantus  SSI Hold home farxiga , metformin , mounjaro  for now   Diabetic neuropathy Gabapentin    Chronic hyponatremia Euvolemic Clinically suspect hyponatremia secondary to alcohol  abuse Check hyponatremia study Fluid restriction Monitor BMP   Chronic anemia H&H stable Monitor CBC  Essential HTN Amlodipine   Lasix   Losartan    Hypothyroid TSH >10 Add on T4 - WNL Given TSH >10 and sytmpoms over past few mos (wife notes more sluggish, weight gain, constipation) will start synthroid  Outpatient follow up   Anx/Dep Cymbalta    BPH Finasteride   Tamsulosin    GERD PPI   Class 1 obesity based on BMI: Body mass index is 29.84 kg/m.[name] Significantly low or high BMI is associated with higher medical risk.  Underweight - under 18  overweight - 25 to 29 obese - 30 or more Class 1 obesity: BMI of 30.0 to 34 Class 2 obesity: BMI of 35.0 to 39 Class 3 obesity: BMI of 40.0 to 49 Super Morbid Obesity: BMI 50-59 Super-super Morbid Obesity: BMI 60+ Healthy nutrition and physical activity advised as adjunct to other disease management and risk reduction treatments    DVT prophylaxis: lovenox  IV fluids: no continuous IV fluids  Nutrition: carb modified diet Central lines / other devices: none  Code Status: FULL CODE ACP documentation reviewed: none on file in [facility]  TOC needs: TBD Medical barriers to dispo: pending surgical recovery as he may need vascular procedure/workup too. Expected medical readiness for discharge weekend unless needing more procedures .              Subjective / Brief ROS:  Patient reports no  concerns Denies CP/SOB.  Pain controlled.    Family Communication: wife at bedside, all concerns thoroughly addressed, defer specific questions re: surgery and anesthesia but answered as able and outlined plan of care in detail     Objective Findings:  Vitals:   05/21/24 1440 05/21/24 1955 05/22/24 0443 05/22/24 0802  BP:  121/66 (!) 112/55 133/65 132/70  Pulse: (!) 106 99 90 96  Resp: 17 18 18 16   Temp: 99 F (37.2 C) 98 F (36.7 C) 97.8 F (36.6 C) 98.8 F (37.1 C)  TempSrc:  Oral Oral   SpO2: 98% 94% 97% 93%  Weight:      Height:        Intake/Output Summary (Last 24 hours) at 05/22/2024 1307 Last data filed at 05/22/2024 1018 Gross per 24 hour  Intake 636.66 ml  Output --  Net 636.66 ml   Filed Weights   05/21/24 0801  Weight: 99.8 kg    Examination:  Physical Exam Constitutional:      General: He is not in acute distress.    Appearance: He is not ill-appearing.  Cardiovascular:     Rate and Rhythm: Normal rate and regular rhythm.  Pulmonary:     Effort: Pulmonary effort is normal.     Breath sounds: Normal breath sounds.  Skin:    General: Skin is warm and dry.  Neurological:     Mental Status: He is alert and oriented to person, place, and time. Mental status is at baseline.  Psychiatric:        Mood and Affect: Mood normal.        Behavior: Behavior normal.          Scheduled Medications:   amLODipine   5 mg Oral Daily   DULoxetine   60 mg Oral BID   enoxaparin  (LOVENOX ) injection  40 mg Subcutaneous Q24H   finasteride   5 mg Oral QHS   gabapentin   800 mg Oral TID   insulin  aspart  0-15 Units Subcutaneous TID WC   insulin  aspart  0-5 Units Subcutaneous QHS   insulin  glargine-yfgn  20 Units Subcutaneous Daily   losartan   25 mg Oral Daily   pantoprazole  40 mg Oral Daily   simvastatin   40 mg Oral QHS   tamsulosin   0.4 mg Oral QHS    Continuous Infusions:  piperacillin-tazobactam (ZOSYN )  IV 3.375 g (05/22/24 0552)   vancomycin  1,000 mg (05/22/24 0241)    PRN Medications:  bisacodyl, docusate sodium, hydrOXYzine , ondansetron  **OR** ondansetron  (ZOFRAN ) IV, oxyCODONE   Antimicrobials from admission:  Anti-infectives (From admission, onward)    Start     Dose/Rate Route Frequency Ordered Stop   05/22/24 0200  vancomycin  (VANCOCIN ) IVPB 1000 mg/200 mL premix         1,000 mg 200 mL/hr over 60 Minutes Intravenous Every 12 hours 05/21/24 1308     05/21/24 1400  piperacillin-tazobactam (ZOSYN ) IVPB 3.375 g        3.375 g 12.5 mL/hr over 240 Minutes Intravenous Every 8 hours 05/21/24 1155     05/21/24 1145  vancomycin  (VANCOCIN ) IVPB 1000 mg/200 mL premix        1,000 mg 200 mL/hr over 60 Minutes Intravenous  Once 05/21/24 1134 05/22/24 0840   05/21/24 0830  vancomycin  (VANCOCIN ) IVPB 1000 mg/200 mL premix        1,000 mg 200 mL/hr over 60 Minutes Intravenous  Once 05/21/24 0817 05/21/24 1028   05/21/24 0830  piperacillin-tazobactam (ZOSYN ) IVPB 3.375 g  3.375 g 100 mL/hr over 30 Minutes Intravenous  Once 05/21/24 9182 05/21/24 9081           Data Reviewed:  I have personally reviewed the following...  CBC: Recent Labs  Lab 05/21/24 0803  WBC 14.1*  NEUTROABS 11.9*  HGB 8.9*  HCT 27.9*  MCV 87.5  PLT 427*   Basic Metabolic Panel: Recent Labs  Lab 05/21/24 0803 05/22/24 0400  NA 126* 129*  K 4.5 4.5  CL 90* 95*  CO2 22 21*  GLUCOSE 199* 128*  BUN 27* 20  CREATININE 1.27* 1.12  CALCIUM 8.6* 8.2*   GFR: Estimated Creatinine Clearance: 77.2 mL/min (by C-G formula based on SCr of 1.12 mg/dL). Liver Function Tests: Recent Labs  Lab 05/21/24 0803  AST 50*  ALT 56*  ALKPHOS 211*  BILITOT 0.9  PROT 6.8  ALBUMIN 2.7*   No results for input(s): LIPASE, AMYLASE in the last 168 hours. No results for input(s): AMMONIA in the last 168 hours. Coagulation Profile: No results for input(s): INR, PROTIME in the last 168 hours. Cardiac Enzymes: No results for input(s): CKTOTAL, CKMB, CKMBINDEX, TROPONINI in the last 168 hours. BNP (last 3 results) No results for input(s): PROBNP in the last 8760 hours. HbA1C: No results for input(s): HGBA1C in the last 72 hours. CBG: Recent Labs  Lab 05/21/24 1219 05/21/24 1652 05/21/24 2101 05/22/24 0802 05/22/24 1207  GLUCAP 116* 131* 121* 133* 122*    Lipid Profile: No results for input(s): CHOL, HDL, LDLCALC, TRIG, CHOLHDL, LDLDIRECT in the last 72 hours. Thyroid Function Tests: Recent Labs    05/21/24 0803 05/22/24 0400  TSH 10.814*  --   FREET4  --  0.94   Anemia Panel: No results for input(s): VITAMINB12, FOLATE, FERRITIN, TIBC, IRON, RETICCTPCT in the last 72 hours. Most Recent Urinalysis On File:     Component Value Date/Time   COLORURINE YELLOW (A) 05/21/2024 0836   APPEARANCEUR HAZY (A) 05/21/2024 0836   APPEARANCEUR Clear 06/27/2022 1511   LABSPEC 1.016 05/21/2024 0836   PHURINE 5.0 05/21/2024 0836   GLUCOSEU >=500 (A) 05/21/2024 0836   HGBUR NEGATIVE 05/21/2024 0836   BILIRUBINUR NEGATIVE 05/21/2024 0836   BILIRUBINUR Negative 06/27/2022 1511   KETONESUR NEGATIVE 05/21/2024 0836   PROTEINUR NEGATIVE 05/21/2024 0836   NITRITE NEGATIVE 05/21/2024 0836   LEUKOCYTESUR NEGATIVE 05/21/2024 0836   Sepsis Labs: @LABRCNTIP (procalcitonin:4,lacticidven:4) Microbiology: Recent Results (from the past 240 hours)  Aerobic/Anaerobic Culture w Gram Stain (surgical/deep wound)     Status: None (Preliminary result)   Collection Time: 05/20/24 12:13 PM   Specimen: Abscess  Result Value Ref Range Status   Specimen Description   Final    ABSCESS Performed at [facility], [address] [place], [place] [address]    Special Requests   Final    ABSCESS OF RIGHT FOOT Performed at [facility], [address] [place], [place] [address]    Gram Stain NO WBC SEEN NO ORGANISMS SEEN   Final   Culture   Final    FEW GRAM NEGATIVE RODS FEW GROUP B STREP(S.AGALACTIAE)ISOLATED TESTING AGAINST S. AGALACTIAE NOT ROUTINELY PERFORMED DUE TO PREDICTABILITY OF AMP/PEN/VAN SUSCEPTIBILITY. SUSCEPTIBILITIES TO FOLLOW Performed at [facility], [address] [place], [place] [address]    Report Status PENDING  Incomplete  Blood Culture (routine x 2)     Status: None (Preliminary  result)   Collection Time: 05/21/24  8:36 AM   Specimen: BLOOD RIGHT ARM  Result Value Ref Range  Status   Specimen Description BLOOD RIGHT ARM  Final   Special Requests   Final    BOTTLES DRAWN AEROBIC AND ANAEROBIC Blood Culture results may not be optimal due to an inadequate volume of blood received in culture bottles   Culture   Final    NO GROWTH < 24 HOURS Performed at [facility], [address] [place], [place] [address]    Report Status PENDING  Incomplete  Blood Culture (routine x 2)     Status: None (Preliminary result)   Collection Time: 05/21/24  8:36 AM   Specimen: BLOOD RIGHT ARM  Result Value Ref Range Status   Specimen Description BLOOD RIGHT ARM  Final   Special Requests   Final    BOTTLES DRAWN AEROBIC AND ANAEROBIC Blood Culture results may not be optimal due to an inadequate volume of blood received in culture bottles   Culture   Final    NO GROWTH < 24 HOURS Performed at [facility], [address] [place], [place] [address]    Report Status PENDING  Incomplete      Radiology Studies last 3 days: US  ARTERIAL ABI (SCREENING LOWER EXTREMITY) Result Date: 05/21/2024 EXAM: VASCULAR SCREENING 05/21/2024 11:55:16 AM CLINICAL HISTORY: Diabetic foot ulcer (HCC) [id number]. TECHNIQUE: The ABIs were also acquired. COMPARISON: None available. FINDINGS: ANKLE BRACHIAL INDEX: Right: 1.26 Left: 1.26 ABIs were within normal range. Multiphasic waveforms reported in the distal calf bilaterally. IMPRESSION: 1. No evidence of hemodynamically significant lower extremity arterial occlusive disease at rest. Electronically signed by: Katheleen Faes MD 05/21/2024 12:22 PM EDT RP Workstation: [id number]   MR FOOT RIGHT WO CONTRAST Result Date: 05/21/2024 EXAM: MRI of the right Foot without contrast. 05/21/2024 11:36:46 AM TECHNIQUE: Multiplanar multisequence MRI of the right foot was performed from the posterior subtalar joint through the stump without the  administration of intravenous contrast. COMPARISON: Radiographs 05/21/2024. CLINICAL HISTORY: Right foot swelling, diabetic, suspected osteomyelitis, infection, and fever. FINDINGS: LISFRANC JOINT: Visualized Lisfranc ligament is intact. No significant Lisfranc interval widening or significant periligamentous edema. BONE MARROW: Trace endosteal edema along the distal margins of the 2nd and 3rd, probably reactive and not currently considered specific for osteomyelitis. No acute fracture or aggressive marrow replacing lesion. GREATER AND LESSER MTP JOINTS: No significant joint effusion or osseous erosions. No significant degenerative changes. Normal alignment. SOFT TISSUES: Out distal ulceration along the plantar stump margin as shown on image 18 series 6 likely accounting for the gas density in this vicinity on prior radiography. Dorsal to the 2nd and 3rd metatarsals, a 4.2 x 1.5 x 1.8 cm fluid collection is present and suspicious for abscess. This is probably spontaneously draining to the skin surface through a branching sinus tract anteriorly on images 13 through 17 of series 6 and speckled low signal internally in the fluid collection may represent gas. Appearance compatible with abscess potentially spontaneously draining. Surrounding subcutaneous edema along the dorsum of the foot may well reflect cellulitis. Regional muscular atrophy. TENDONS: Tibialis posterior, flexor digitorum longus, and flexor hallucis longus tenosynovitis noted. IMPRESSION: 1. Dorsal fluid collection in the soft tissues above the remaining 2nd and 3rd metatarsals (4.2 x 1.5 x 1.8 cm) suspicious for abscess, potentially spontaneously draining to the anterior cutaneous surface , with surrounding subcutaneous edema compatible with cellulitis. 2. Distal ulceration along the plantar stump margin, likely accounting for the gas density seen on prior radiography. 3. Trace endosteal edema along the distal margins of the 2nd and 3rd metatarsals,  likely reactive and  not specific for osteomyelitis. 4. Tibialis posterior, flexor digitorum longus, and flexor hallucis longus tenosynovitis. 5. Regional muscular atrophy. Electronically signed by: Ryan Salvage MD 05/21/2024 11:50 AM EDT RP Workstation: [id number]   DG Foot Complete Right Result Date: 05/21/2024 EXAM: 3 OR MORE VIEW(S) XRAY OF THE RIGHT FOOT 05/21/2024 08:36:00 AM COMPARISON: 12/21/2016 CLINICAL HISTORY: Foot infection. 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 FINDINGS: BONES AND JOINTS: Transmetatarsal amputation of the first through fifth digits. No current bony erosive or destructive findings to specifically indicate osteomyelitis. Plantar calcaneal spur. No joint dislocation. SOFT TISSUES: Soft tissue swelling overlying the amputation stump gas density collection along the plantar distal stump compatible with a large ulceration or localized infection involving the soft tissues, correlate with visual inspection. Atheromatous vascular calcifications. IMPRESSION: 1. Soft tissue swelling and gas along the plantar distal stump, compatible with a large ulceration or localized soft tissue infection. 2. No radiographic evidence of osteomyelitis. 3. Atheromatous vascular calcifications. 4. Plantar calcaneal spur. Electronically signed by: Ryan Salvage MD 05/21/2024 09:04 AM EDT RP Workstation: [id number]       Time spent: 50 min     Laneta Blunt, DO [facility] 05/22/2024, 1:07 PM    Dictation software may have been used to generate the above note. Typos may occur and escape review in typed/dictated notes. Please contact Dr Blunt directly for clarity if needed.  Staff may message me via secure chat in Epic  but this may not receive an immediate response,  please page me for urgent matters!  If 7PM-7AM, please contact night coverage [email or web address]

## 2024-05-22 NOTE — Hospital Course (Addendum)
 Hospital course / significant events:   [name] is a 68 y.o. male with medical history significant of IDDM, diabetic foot infection status post right transmetatarsal amputation, HTN, HLD, PVD, alcohol  abuse, psoriasis, Schatzki's ring, diabetic neuropathy, sent from podiatrist office for evaluation of worsening of right foot infection. Patient has a chronic right foot ulcer developed 2 to 3 months ago, for which he has been followed by outpatient wound care and podiatry. This time he started noticed increasing rash and swelling of the right foot, and started to have low-grade fever last 2 days with Tmax 101 last night. He went to see podiatry 10/29 who sent patient to ED   10/29: MRI showed right foot abscess and tenosynovitis but no signs of osteomyelitis - admitted to hospitalist. Vascular consult - would proceed w/ transmetatarsal amputation but low threshold for angio +/- BKA next week if concern for severe small vessel disease / nonhealing  10/30: stable, await surgery tomorrow, remain on IV abx 10/31: underwent surgery as below w/ I&D and bone excision, implantation abx beads. Pend cultures/pathology but podiatry will follow in office  11/01: podiatry Continue to monitor cultures. Suspect in 1 to 2 days patient will be stable for discharge. PT/OT following. 11/02: per podiatry ok for dicsharge. NWB. OK for Duricef 500 mg twice daily + Cipro  500 mg twice daily. x10 days. Wife states she was told could dc tomorrow so does not have equipment or house ready yet. Pt is medically stable, dc orders placed, abx and synthroid rx in place.      Consultants:  Podiatry Vascular surgery   Procedures/Surgeries: 05/23/24 1. Percutaneous tendo Achilles lengthening right lower extremity 2. I&D abscess distal transmetatarsal amputation site 3. Excision of bone distal second metatarsal 4. Excision of bone distal third metatarsal 5. Excision of bone distal fourth metatarsal 6. Intraoperative fluoroscopy  without assistance of radiologist 7. Implantation antibiotic impregnated beads       ASSESSMENT & PLAN:   Right foot diabetic ulcer infection - sepsis RULED OUT Right foot abscess formation Right foot infective tenosynovitis PAD with right lower extremity transmetatarsal amputation from October 2024.  S/p I&D see above vancomycin  and Zosyn  -->  Duricef 500 mg twice daily + Cipro  500 mg twice daily. x10 days.   Patient will remain nonweightbearing.  dressing changes - simple dry dressing would suffice.  This can be changed 3 times a week.  Podiatry discussed this with the patient as well as his wife yesterday and they felt comfortable to perform this. Podiatry recommended follow-up with [name] in 1 week. From medical and podiatry standpoint patient is stable for discharge but wife is declining to leave today stating she was told he could go tomorrow so she doesn't have the house ready yet for him / DME not all in place. Made TOC aware.   PVD/PAD Chronic claudication HLD Arterial Doppler no concerns Vascular consult - more concern for small vessel disease, can get angio if surgical intervention gives any concern for insufficient blood flow, and would consider for BKA - podiatry to follow outpatient and involve vascular as needed  Continue statin Not on aspirin due to history of NSAIDs allergy    IDDM with hyperglycemia Hx nonadherence to recommended therapy  Resume home meds    Diabetic neuropathy Gabapentin    Chronic hyponatremia Euvolemic Clinically suspect hyponatremia secondary to alcohol  abuse / hypothyroid  Likely w/ hypothyroid - treat as below  EtOH abstinence encouraged  Monitor BMP outpatient    Chronic anemia H&H stable  Monitor CBC outpatient   Essential HTN Amlodipine   Lasix   Losartan    Hypothyroid TSH >10 (Not probably also contributing to hyponatremia)  Add on T4 - WNL TSH >10 and sytmpoms over past few mos (wife notes more sluggish, weight gain,  constipation) will start synthroid  Outpatient follow up   Anx/Dep Cymbalta    BPH Finasteride   Tamsulosin    GERD PPI   Class 1 obesity based on BMI: Body mass index is 29.84 kg/m.[name] Significantly low or high BMI is associated with higher medical risk.  Underweight - under 18  overweight - 25 to 29 obese - 30 or more Class 1 obesity: BMI of 30.0 to 34 Class 2 obesity: BMI of 35.0 to 39 Class 3 obesity: BMI of 40.0 to 49 Super Morbid Obesity: BMI 50-59 Super-super Morbid Obesity: BMI 60+ Healthy nutrition and physical activity advised as adjunct to other disease management and risk reduction treatments

## 2024-05-22 NOTE — Plan of Care (Signed)

## 2024-05-22 NOTE — Progress Notes (Signed)
 Daily Progress Note   Subjective  - * No surgery found *  Follow-up right foot ulceration.  Patient resting comfortably.  Objective Vitals:   05/21/24 1440 05/21/24 1955 05/22/24 0443 05/22/24 0802  BP: 121/66 (!) 112/55 133/65 132/70  Pulse: (!) 106 99 90 96  Resp: 17 18 18 16   Temp: 99 F (37.2 C) 98 F (36.7 C) 97.8 F (36.6 C) 98.8 F (37.1 C)  TempSrc:  Oral Oral   SpO2: 98% 94% 97% 93%  Weight:      Height:        Physical Exam: Dressing intact.  Reviewed MRI today.  It shows the ulcerative site with infection to the forefoot.  Mild periosteal bone reaction without clear osteomyelitis to the residual forefoot at this time.  Patient's Alvidrez blood cell at 14.1 upon admission.  Laboratory CBC    Component Value Date/Time   WBC 14.1 (H) 05/21/2024 0803   HGB 8.9 (L) 05/21/2024 0803   HGB 11.2 (L) 09/06/2023 0856   HCT 27.9 (L) 05/21/2024 0803   HCT 34.4 (L) 09/06/2023 0856   PLT 427 (H) 05/21/2024 0803   PLT 314 09/06/2023 0856    BMET    Component Value Date/Time   NA 129 (L) 05/22/2024 0400   NA 137 09/06/2023 0856   K 4.5 05/22/2024 0400   CL 95 (L) 05/22/2024 0400   CO2 21 (L) 05/22/2024 0400   GLUCOSE 128 (H) 05/22/2024 0400   BUN 20 05/22/2024 0400   BUN 13 09/06/2023 0856   CREATININE 1.12 05/22/2024 0400   CREATININE 1.23 01/03/2013 1212   CALCIUM 8.2 (L) 05/22/2024 0400   GFRNONAA >60 05/22/2024 0400   GFRNONAA >60 01/03/2013 1212   GFRAA 95 03/25/2020 0755   GFRAA >60 01/03/2013 1212    Assessment/Planning: Diabetic foot infection right forefoot Equinus contracture right lower extremity  I long discussion with the patient and family in regards to plan for surgical invention.  He has a large ulcer to the plantar aspect of the right distal forefoot from the transmetatarsal amputation site with areas of abscess.  He also has equinus contracture.  I discussed performing a tendo Achilles lengthening as well as debridement of all nonviable  tissue and bone to the forefoot.  We discussed the possibility of having to leave this open and perform secondary surgery or delayed primary closure down the road.  He is a high risk patient.  He was seen by vascular surgery and they felt comfortable at this time is likely mostly small vessel disease and to go and proceed with surgery without further intervention.  If there is poor blood flow intraoperatively we can always reconsult vascular for angio if needed.  Patient understood risks and wishes to proceed with surgery tomorrow.   Ashley Soulier A  05/22/2024, 1:30 PM

## 2024-05-22 NOTE — Progress Notes (Signed)
 Mobility Specialist - Progress Note   05/22/24 1419  Mobility  Activity Stood at bedside  Level of Assistance Standby assist, set-up cues, supervision of patient - no hands on  Assistive Device Front wheel walker  Distance Ambulated (ft) 2 ft  Activity Response Tolerated well  Mobility visit 1 Mobility  Mobility Specialist Start Time (ACUTE ONLY) 1408  Mobility Specialist Stop Time (ACUTE ONLY) 1412  Mobility Specialist Time Calculation (min) (ACUTE ONLY) 4 min   Pt supine upon entry, utilizing RA-- endorsed min pain. Pt completed bed mob, STS to RW and stood at bedside for ~ 1 min. Pt returned to bed, left supine with needs within reach and wife present at bedside.  America Silvan Mobility Specialist 05/22/24 2:23 PM

## 2024-05-22 NOTE — Plan of Care (Signed)
   Problem: Coping: Goal: Ability to adjust to condition or change in health will improve Outcome: Progressing   Problem: Metabolic: Goal: Ability to maintain appropriate glucose levels will improve Outcome: Progressing

## 2024-05-23 ENCOUNTER — Encounter
Admission: EM | Disposition: A | Discharge status: Home Health | Payer: Self-pay | Source: Ambulatory Visit | Attending: Osteopathic Medicine

## 2024-05-23 ENCOUNTER — Inpatient Hospital Stay: Admitting: Anesthesiology

## 2024-05-23 ENCOUNTER — Encounter: Payer: Self-pay | Admitting: Internal Medicine

## 2024-05-23 ENCOUNTER — Inpatient Hospital Stay

## 2024-05-23 DIAGNOSIS — E11628 Type 2 diabetes mellitus with other skin complications: Secondary | ICD-10-CM | POA: Diagnosis not present

## 2024-05-23 DIAGNOSIS — L089 Local infection of the skin and subcutaneous tissue, unspecified: Secondary | ICD-10-CM | POA: Diagnosis not present

## 2024-05-23 HISTORY — PX: ACHILLES TENDON LENGTHENING: SHX6455

## 2024-05-23 HISTORY — PX: IRRIGATION AND DEBRIDEMENT FOOT: SHX6602

## 2024-05-23 LAB — CBC
HCT: 25.5 % — ABNORMAL LOW (ref 39.0–52.0)
Hemoglobin: 8.3 g/dL — ABNORMAL LOW (ref 13.0–17.0)
MCH: 28 pg (ref 26.0–34.0)
MCHC: 32.5 g/dL (ref 30.0–36.0)
MCV: 86.1 fL (ref 80.0–100.0)
Platelets: 419 K/uL — ABNORMAL HIGH (ref 150–400)
RBC: 2.96 MIL/uL — ABNORMAL LOW (ref 4.22–5.81)
RDW: 14.9 % (ref 11.5–15.5)
WBC: 10.9 K/uL — ABNORMAL HIGH (ref 4.0–10.5)
nRBC: 0 % (ref 0.0–0.2)

## 2024-05-23 LAB — BASIC METABOLIC PANEL WITH GFR
Anion gap: 9 (ref 5–15)
BUN: 16 mg/dL (ref 8–23)
CO2: 23 mmol/L (ref 22–32)
Calcium: 8.1 mg/dL — ABNORMAL LOW (ref 8.9–10.3)
Chloride: 98 mmol/L (ref 98–111)
Creatinine, Ser: 1.02 mg/dL (ref 0.61–1.24)
GFR, Estimated: >60 mL/min (ref >60)
Glucose, Bld: 125 mg/dL — ABNORMAL HIGH (ref 70–99)
Potassium: 4.3 mmol/L (ref 3.5–5.1)
Sodium: 130 mmol/L — ABNORMAL LOW (ref 135–145)

## 2024-05-23 LAB — GLUCOSE, CAPILLARY
Glucose-Capillary: 104 mg/dL — ABNORMAL HIGH (ref 70–99)
Glucose-Capillary: 108 mg/dL — ABNORMAL HIGH (ref 70–99)
Glucose-Capillary: 103 mg/dL — ABNORMAL HIGH (ref 70–99)
Glucose-Capillary: 136 mg/dL — ABNORMAL HIGH (ref 70–99)
Glucose-Capillary: 225 mg/dL — ABNORMAL HIGH (ref 70–99)

## 2024-05-23 SURGERY — IRRIGATION AND DEBRIDEMENT FOOT
Anesthesia: General | Site: Leg Lower | Laterality: Right

## 2024-05-23 MED ORDER — VANCOMYCIN HCL 1000 MG IV SOLR
INTRAVENOUS | Status: AC
  Filled 2024-05-23: qty 20

## 2024-05-23 MED ORDER — PROPOFOL 10 MG/ML IV BOLUS
INTRAVENOUS | Status: AC
  Filled 2024-05-23: qty 20

## 2024-05-23 MED ORDER — DEXAMETHASONE SOD PHOSPHATE PF 10 MG/ML IJ SOLN
INTRAMUSCULAR | Status: DC | PRN
  Administered 2024-05-23: 5 mg via INTRAVENOUS

## 2024-05-23 MED ORDER — FENTANYL CITRATE (PF) 100 MCG/2ML IJ SOLN
INTRAMUSCULAR | Status: DC | PRN
  Administered 2024-05-23 (×2): 50 ug via INTRAVENOUS

## 2024-05-23 MED ORDER — FENTANYL CITRATE (PF) 100 MCG/2ML IJ SOLN: 25.0000 ug | INTRAMUSCULAR | Status: DC | PRN

## 2024-05-23 MED ORDER — BUPIVACAINE HCL (PF) 0.5 % IJ SOLN
INTRAMUSCULAR | Status: AC
Start: 2024-05-23 — End: 2024-05-23
  Filled 2024-05-23: qty 30

## 2024-05-23 MED ORDER — FENTANYL CITRATE (PF) 100 MCG/2ML IJ SOLN
INTRAMUSCULAR | Status: AC
  Filled 2024-05-23: qty 2

## 2024-05-23 MED ORDER — VANCOMYCIN HCL 1 G IV SOLR
INTRAVENOUS | Status: DC | PRN
  Administered 2024-05-23: 1000 mg

## 2024-05-23 MED ORDER — ACETAMINOPHEN 10 MG/ML IV SOLN
INTRAVENOUS | Status: DC | PRN
  Administered 2024-05-23: 1000 mg via INTRAVENOUS

## 2024-05-23 MED ORDER — ROCURONIUM BROMIDE 100 MG/10ML IV SOLN
INTRAVENOUS | Status: DC | PRN
Start: 2024-05-23 — End: 2024-05-23
  Administered 2024-05-23: 50 mg via INTRAVENOUS

## 2024-05-23 MED ORDER — SODIUM CHLORIDE 0.9 % IV SOLN: INTRAVENOUS | Status: DC

## 2024-05-23 MED ORDER — ACETAMINOPHEN 10 MG/ML IV SOLN: 1000.0000 mg | Freq: Once | INTRAVENOUS | Status: DC | PRN

## 2024-05-23 MED ORDER — MIDAZOLAM HCL (PF) 2 MG/2ML IJ SOLN
INTRAMUSCULAR | Status: DC | PRN
  Administered 2024-05-23 (×2): 1 mg via INTRAVENOUS

## 2024-05-23 MED ORDER — 0.9 % SODIUM CHLORIDE (POUR BTL) OPTIME
TOPICAL | Status: DC | PRN
  Administered 2024-05-23: 1000 mL

## 2024-05-23 MED ORDER — BUPIVACAINE HCL (PF) 0.5 % IJ SOLN
INTRAMUSCULAR | Status: DC | PRN
  Administered 2024-05-23: 30 mL

## 2024-05-23 MED ORDER — ONDANSETRON HCL 4 MG/2ML IJ SOLN
INTRAMUSCULAR | Status: AC
  Filled 2024-05-23: qty 2

## 2024-05-23 MED ORDER — ROCURONIUM BROMIDE 10 MG/ML (PF) SYRINGE
PREFILLED_SYRINGE | INTRAVENOUS | Status: AC
  Filled 2024-05-23: qty 10

## 2024-05-23 MED ORDER — ONDANSETRON HCL 4 MG/2ML IJ SOLN
INTRAMUSCULAR | Status: DC | PRN
Start: 2024-05-23 — End: 2024-05-23
  Administered 2024-05-23: 4 mg via INTRAVENOUS

## 2024-05-23 MED ORDER — ACETAMINOPHEN 10 MG/ML IV SOLN
INTRAVENOUS | Status: AC
  Filled 2024-05-23: qty 100

## 2024-05-23 MED ORDER — LEVOTHYROXINE SODIUM 50 MCG PO TABS
100.0000 ug | ORAL_TABLET | Freq: Every day | ORAL | Status: DC
  Administered 2024-05-24 – 2024-05-27 (×4): 100 ug via ORAL
  Filled 2024-05-23 (×4): qty 2

## 2024-05-23 MED ORDER — MIDAZOLAM HCL 2 MG/2ML IJ SOLN
INTRAMUSCULAR | Status: AC
  Filled 2024-05-23: qty 2

## 2024-05-23 MED ORDER — LIDOCAINE HCL (CARDIAC) PF 100 MG/5ML IV SOSY
PREFILLED_SYRINGE | INTRAVENOUS | Status: DC | PRN
  Administered 2024-05-23: 100 mg via INTRAVENOUS

## 2024-05-23 MED ORDER — LIDOCAINE HCL (PF) 2 % IJ SOLN
INTRAMUSCULAR | Status: AC
  Filled 2024-05-23: qty 5

## 2024-05-23 MED ORDER — SUGAMMADEX SODIUM 200 MG/2ML IV SOLN
INTRAVENOUS | Status: DC | PRN
  Administered 2024-05-23: 200 mg via INTRAVENOUS

## 2024-05-23 MED ORDER — LACTATED RINGERS IV SOLN: INTRAVENOUS | Status: DC

## 2024-05-23 MED ORDER — VANCOMYCIN HCL IN DEXTROSE 1-5 GM/200ML-% IV SOLN
1000.0000 mg | Freq: Two times a day (BID) | INTRAVENOUS | Status: DC
  Administered 2024-05-23: 1000 mg via INTRAVENOUS
  Filled 2024-05-23 (×2): qty 200

## 2024-05-23 MED ORDER — PROPOFOL 10 MG/ML IV BOLUS
INTRAVENOUS | Status: DC | PRN
Start: 2024-05-23 — End: 2024-05-23
  Administered 2024-05-23: 90 mg via INTRAVENOUS

## 2024-05-23 MED ORDER — PIPERACILLIN-TAZOBACTAM 3.375 G IVPB
3.3750 g | Freq: Three times a day (TID) | INTRAVENOUS | Status: DC
Start: 2024-05-23 — End: 2024-05-26
  Administered 2024-05-23 – 2024-05-26 (×9): 3.375 g via INTRAVENOUS
  Filled 2024-05-23 (×8): qty 50

## 2024-05-23 SURGICAL SUPPLY — 64 items
BLADE OSC/SAGITTAL MD 5.5X18 (BLADE) IMPLANT
BLADE SURG 15 STRL LF DISP TIS (BLADE) ×2 IMPLANT
BLADE SURG MINI STRL (BLADE) ×4 IMPLANT
BLADE SW THK.38XMED LNG THN (BLADE) IMPLANT
BNDG COHESIVE 4X5 TAN STRL LF (GAUZE/BANDAGES/DRESSINGS) ×2 IMPLANT
BNDG COHESIVE 6X5 TAN ST LF (GAUZE/BANDAGES/DRESSINGS) ×2 IMPLANT
BNDG ELASTIC 4INX 5YD STR LF (GAUZE/BANDAGES/DRESSINGS) ×2 IMPLANT
BNDG ELASTIC 4X5.8 VLCR NS LF (GAUZE/BANDAGES/DRESSINGS) ×2 IMPLANT
BNDG ESMARCH 4X12 STRL LF (GAUZE/BANDAGES/DRESSINGS) ×2 IMPLANT
BNDG GAUZE DERMACEA FLUFF 4 (GAUZE/BANDAGES/DRESSINGS) ×2 IMPLANT
BNDG STRETCH GAUZE 3IN X12FT (GAUZE/BANDAGES/DRESSINGS) ×2 IMPLANT
BOOT WALKER MEDIUM (SOFTGOODS) IMPLANT
CANISTER WOUND CARE 500ML ATS (WOUND CARE) ×2 IMPLANT
CUFF TOURN SGL QUICK 12 (TOURNIQUET CUFF) IMPLANT
CUFF TOURN SGL QUICK 18X4 (TOURNIQUET CUFF) IMPLANT
DRAPE FLUOR MINI C-ARM 54X84 (DRAPES) ×2 IMPLANT
DRAPE XRAY CASSETTE 23X24 (DRAPES) IMPLANT
DRSG MEPILEX FLEX 3X3 (GAUZE/BANDAGES/DRESSINGS) IMPLANT
DURAPREP 26ML APPLICATOR (WOUND CARE) ×2 IMPLANT
ELECTRODE REM PT RTRN 9FT ADLT (ELECTROSURGICAL) ×2 IMPLANT
GAUZE PACKING 0.25INX5YD STRL (GAUZE/BANDAGES/DRESSINGS) ×2 IMPLANT
GAUZE SPONGE 4X4 12PLY STRL (GAUZE/BANDAGES/DRESSINGS) ×2 IMPLANT
GAUZE STRETCH 2X75IN STRL (MISCELLANEOUS) ×2 IMPLANT
GAUZE XEROFORM 1X8 LF (GAUZE/BANDAGES/DRESSINGS) ×2 IMPLANT
GLOVE BIO SURGEON STRL SZ7.5 (GLOVE) ×2 IMPLANT
GLOVE BIOGEL PI IND STRL 8 (GLOVE) ×2 IMPLANT
GLOVE INDICATOR 8.0 STRL GRN (GLOVE) ×2 IMPLANT
GOWN STRL REUS W/ TWL LRG LVL3 (GOWN DISPOSABLE) ×2 IMPLANT
GOWN STRL REUS W/ TWL XL LVL3 (GOWN DISPOSABLE) ×4 IMPLANT
GOWN STRL REUS W/TWL MED LVL3 (GOWN DISPOSABLE) ×2 IMPLANT
GOWN STRL REUS W/TWL XL LVL4 (GOWN DISPOSABLE) ×2 IMPLANT
HANDPIECE VERSAJET DEBRIDEMENT (MISCELLANEOUS) IMPLANT
IV 0.9% NACL 1000 ML (IV SOLUTION) ×2 IMPLANT
KIT DRSG VAC SLVR GRANUFM (MISCELLANEOUS) ×2 IMPLANT
KIT STIMULAN RAPID CURE 5CC (Orthopedic Implant) IMPLANT
KIT TURNOVER KIT A (KITS) ×2 IMPLANT
LABEL OR SOLS (LABEL) ×2 IMPLANT
MANIFOLD NEPTUNE II (INSTRUMENTS) ×2 IMPLANT
NDL FILTER BLUNT 18X1 1/2 (NEEDLE) ×2 IMPLANT
NDL HYPO 25X1 1.5 SAFETY (NEEDLE) ×4 IMPLANT
NDL SAFETY ECLIP 18X1.5 (MISCELLANEOUS) ×2 IMPLANT
NEEDLE FILTER BLUNT 18X1 1/2 (NEEDLE) ×2 IMPLANT
NEEDLE HYPO 25X1 1.5 SAFETY (NEEDLE) ×4 IMPLANT
NS IRRIG 500ML POUR BTL (IV SOLUTION) ×2 IMPLANT
PACK EXTREMITY ARMC (MISCELLANEOUS) ×2 IMPLANT
PACKING GAUZE IODOFORM 1INX5YD (GAUZE/BANDAGES/DRESSINGS) ×2 IMPLANT
PAD ABD DERMACEA PRESS 5X9 (GAUZE/BANDAGES/DRESSINGS) ×2 IMPLANT
PENCIL SMOKE EVACUATOR (MISCELLANEOUS) ×2 IMPLANT
RASP SM TEAR CROSS CUT (RASP) IMPLANT
SHIELD FULL FACE ANTIFOG 7M (MISCELLANEOUS) ×2 IMPLANT
SOL .9 NS 3000ML IRR UROMATIC (IV SOLUTION) ×2 IMPLANT
STOCKINETTE IMPERVIOUS 9X36 MD (GAUZE/BANDAGES/DRESSINGS) ×2 IMPLANT
STOCKINETTE STRL 6IN 960660 (GAUZE/BANDAGES/DRESSINGS) ×2 IMPLANT
STRIP CLOSURE SKIN 1/4X4 (GAUZE/BANDAGES/DRESSINGS) ×2 IMPLANT
SUT ETHILON 2 0 FS 18 (SUTURE) IMPLANT
SUT VIC AB 2-0 SH 27XBRD (SUTURE) ×2 IMPLANT
SUT VIC AB 3-0 SH 27X BRD (SUTURE) ×2 IMPLANT
SUTURE EHLN 3-0 FS-10 30 BLK (SUTURE) IMPLANT
SWAB CULTURE AMIES ANAERIB BLU (MISCELLANEOUS) IMPLANT
SYR 10ML LL (SYRINGE) ×2 IMPLANT
SYR 3ML LL SCALE MARK (SYRINGE) ×2 IMPLANT
TIP FAN IRRIG PULSAVAC PLUS (DISPOSABLE) ×2 IMPLANT
TRAP FLUID SMOKE EVACUATOR (MISCELLANEOUS) ×2 IMPLANT
WATER STERILE IRR 500ML POUR (IV SOLUTION) ×2 IMPLANT

## 2024-05-23 NOTE — Plan of Care (Signed)

## 2024-05-23 NOTE — Anesthesia Preprocedure Evaluation (Addendum)
 Anesthesia Evaluation  Patient identified by MRN, date of birth, ID band Patient awake    Reviewed: Allergy  & Precautions, NPO status , Patient's Chart, lab work & pertinent test results  History of Anesthesia Complications Negative for: history of anesthetic complications  Airway Mallampati: IV   Neck ROM: Full    Dental  (+) Missing, Chipped   Pulmonary neg pulmonary ROS   Pulmonary exam normal breath sounds clear to auscultation       Cardiovascular hypertension, + Peripheral Vascular Disease  Normal cardiovascular exam Rhythm:Regular Rate:Normal     Neuro/Psych  PSYCHIATRIC DISORDERS Anxiety Depression    Hx alcohol  use disorder; none since 04/2023  Neuromuscular disease (neuropathy)    GI/Hepatic ,GERD  ,,  Endo/Other  diabetes, Type 2, Insulin  DependentHypothyroidism    Renal/GU negative Renal ROS     Musculoskeletal  (+) Arthritis ,  Gout    Abdominal   Peds  Hematology  (+) Blood dyscrasia, anemia   Anesthesia Other Findings Last dose of Mounjaro  05/16/24.  Reproductive/Obstetrics                              Anesthesia Physical Anesthesia Plan  ASA: 3  Anesthesia Plan: General   Post-op Pain Management:    Induction: Intravenous  PONV Risk Score and Plan: 2 and Ondansetron , Dexamethasone  and Treatment may vary due to age or medical condition  Airway Management Planned: Oral ETT  Additional Equipment:   Intra-op Plan:   Post-operative Plan: Extubation in OR  Informed Consent: I have reviewed the patients History and Physical, chart, labs and discussed the procedure including the risks, benefits and alternatives for the proposed anesthesia with the patient or authorized representative who has indicated his/her understanding and acceptance.     Dental advisory given  Plan Discussed with: CRNA  Anesthesia Plan Comments: (Patient consented for risks of anesthesia  including but not limited to:  - adverse reactions to medications - damage to eyes, teeth, lips or other oral mucosa - nerve damage due to positioning  - sore throat or hoarseness - damage to heart, brain, nerves, lungs, other parts of body or loss of life  Informed patient about role of CRNA in peri- and intra-operative care.  Patient voiced understanding.)         Anesthesia Quick Evaluation

## 2024-05-23 NOTE — Transfer of Care (Signed)
 Immediate Anesthesia Transfer of Care Note  Patient: [name]  Procedure(s) Performed: IRRIGATION AND DEBRIDEMENT FOOT (Right: Foot) LENGTHENING, TENDON, ACHILLES (Right: Leg Lower)  Patient Location: PACU  Anesthesia Type:General  Level of Consciousness: awake, alert , and drowsy  Airway & Oxygen Therapy: Patient Spontanous Breathing and Patient connected to face mask oxygen  Post-op Assessment: Report given to RN and Post -op Vital signs reviewed and stable  Post vital signs: Reviewed and stable  Last Vitals:  Vitals Value Taken Time  BP 115/61 1353  Temp 37.1 1353  Pulse 80 05/23/24 13:53  Resp 17 05/23/24 13:53  SpO2 100 % 05/23/24 13:53  Vitals shown include unfiled device data.  Last Pain:  Vitals:   05/23/24 1121  TempSrc: Temporal  PainSc: 6       Patients Stated Pain Goal: 0 (05/23/24 0442)  Complications: No notable events documented.

## 2024-05-23 NOTE — Anesthesia Procedure Notes (Signed)
 Procedure Name: Intubation Date/Time: 05/23/2024 12:32 PM  Performed by: Jackye Spanner, CRNAPre-anesthesia Checklist: Patient identified, Patient being monitored, Timeout performed, Emergency Drugs available and Suction available Patient Re-evaluated:Patient Re-evaluated prior to induction Oxygen Delivery Method: Circle system utilized Preoxygenation: Pre-oxygenation with 100% oxygen Induction Type: IV induction Ventilation: Mask ventilation without difficulty and Oral airway inserted - appropriate to patient size Laryngoscope Size: McGrath and 4 Grade View: Grade I Tube type: Oral Tube size: 7.5 mm Number of attempts: 1 Airway Equipment and Method: Stylet Placement Confirmation: ETT inserted through vocal cords under direct vision, positive ETCO2 and breath sounds checked- equal and bilateral Secured at: 22 cm Tube secured with: Tape Dental Injury: Teeth and Oropharynx as per pre-operative assessment  Comments: Smooth atraumatic intubation, no complications noted

## 2024-05-23 NOTE — Op Note (Signed)
 Operative note   Surgeon:Sindee Stucker Ashley    Assistant: Dr. Krystal Rosella    Preop diagnosis: Ulceration with abscess right foot 2.  Equinus contracture right lower extremity    Postop diagnosis: Same    Procedure: 1.  Percutaneous tendo Achilles lengthening right lower extremity 2.  I&D abscess distal transmetatarsal amputation site 3.  Excision of bone distal second metatarsal 4.  Excision of bone distal third metatarsal 5.  Excision of bone distal fourth metatarsal 6.  Intraoperative fluoroscopy without assistance of radiologist 7.  Implantation antibiotic impregnated beads    EBL: 20 mL    Anesthesia:local and general.  Local consist of a total of 30 cc of 0.5% plain bupivacaine  of traded along all surgical sites    Hemostasis: Mid calf tourniquet inflated to 250 mmHg for approximately 45 minutes    Specimen: Swab for deep wound culture, bone for pathology    Complications: None    Operative indications:[name] is an 68 y.o. that presents today for surgical intervention.  The risks/benefits/alternatives/complications have been discussed and consent has been given.    Procedure:  Patient was brought into the OR and placed on the operating table in thesupine position. After anesthesia was obtained theright lower extremity was prepped and draped in usual sterile fashion.  Attention was initially directed to the posterior aspect of the Achilles where at 1 3 and 5 cm small percutaneous stab incisions were performed.  2 medial and 1 lateral Hemi sections of the Achilles tendon were performed.  Good excursion of the foot was noted with release of the Achilles tendon and lengthening.  At this time this was then closed with a 2-0 nylon.  Next inflation of the tourniquet was performed and attention was directed to the distal aspect of the foot.  The 2 large abscessed areas on the distal dorsal aspect of the foot were open and drained.  All infection was drained from this region.  Nonviable  tissue was then sharply excised with a 15 blade.  The plantar ulceration was then opened at this time.  A large amount of nonviable fibrotic tissue was excised with a Versajet.  Dissection was then carried down to the distal metatarsals.  With use of fluoroscopy the metatarsals were evaluated in the distal second third and a small portion of the distal fourth metatarsal were noted to be elongated at the level of the ulcerative site.  With a power saw an osteotomy was created to the distal aspect of the residual second metatarsal and this was removed from the surgical field in toto.  A power saw was used to create an osteotomy on the distal aspect of the third metatarsal and this was then removed from the surgical field in toto.  Finally with a rongeur with use of fluoroscopy as I was able to remove a small prominent portion of the distal fourth metatarsal.  These were sent for pathological evaluation.  All wounds were then flushed with copious amounts of irrigation.  Evaluation with fluoroscopy revealed good removal of the bone.  At this time closure was then performed.  I was able to primarily close both dorsal and plantar wounds with a 2-0 nylon.  The dorsal distal wound was then packed with vancomycin  impregnated calcium sulfate bone cement.  The most dorsal and lateral wound was loosely closed to allow drainage.  A large bulky sterile dressing was applied to the entire right foot.  Patient was then placed in an equalizer walker boot.  Patient tolerated the procedure and anesthesia well.  Was transported from the OR to the PACU with all vital signs stable and vascular status intact. To be discharged per routine protocol.  Will follow up in approximately 1 week in the outpatient clinic.

## 2024-05-23 NOTE — Progress Notes (Signed)
 PROGRESS NOTE    [name]   FMW:[id number] DOB: 06-23-1956  DOA: 05/21/2024 Date of Service: 05/23/24 which is hospital day 2  PCP: Jimmy Charlie FERNS, MD    Hospital course / significant events:   [name] is a 68 y.o. male with medical history significant of IDDM, diabetic foot infection status post right transmetatarsal amputation, HTN, HLD, PVD, alcohol  abuse, psoriasis, Schatzki's ring, diabetic neuropathy, sent from podiatrist office for evaluation of worsening of right foot infection. Patient has a chronic right foot ulcer developed 2 to 3 months ago, for which he has been followed by outpatient wound care and podiatry. This time he started noticed increasing rash and swelling of the right foot, and started to have low-grade fever last 2 days with Tmax 101 last night. He went to see podiatry 10/29 who sent patient to ED   10/29: MRI showed right foot abscess and tenosynovitis but no signs of osteomyelitis - admitted to hospitalist. Vascular consult - would proceed w/ transmetatarsal amputation but low threshold for angio +/- BKA next week if concern for severe small vessel disease / nonhealing  10/30: stable, await surgery tomorrow, remain on IV abx 10/31: underwent surgery as below w/ I&D and bone excision, implantation abx beads. Pend cultures/pathology but podiatry will follow in office      Consultants:  Podiatry Vascular surgery   Procedures/Surgeries: 05/23/24 1. Percutaneous tendo Achilles lengthening right lower extremity 2. I&D abscess distal transmetatarsal amputation site 3. Excision of bone distal second metatarsal 4. Excision of bone distal third metatarsal 5. Excision of bone distal fourth metatarsal 6. Intraoperative fluoroscopy without assistance of radiologist 7. Implantation antibiotic impregnated beads       ASSESSMENT & PLAN:   Right foot diabetic ulcer infection - sepsis RULED OUT Right foot abscess formation Right foot infective  tenosynovitis PAD with right lower extremity transmetatarsal amputation from October 2024.  vancomycin  and Zosyn  --> vanc dc today based on outpatient cultures --> anticipate cipro  + augmentin  on discharge  S/p &D per podiatry. Follow cultures    PVD/PAD Chronic claudication HLD Arterial Doppler no concerns Vascular consult - more concern for small vessel disease, can get angio if surgical intervention gives any concern for insufficient blood flow, and would consider for BKA  Continue statin Not on aspirin due to history of NSAIDs allergy    IDDM with hyperglycemia Hx nonadherence to recommended therapy  Lantus  SSI Hold home farxiga , metformin , mounjaro  for now   Diabetic neuropathy Gabapentin    Chronic hyponatremia Euvolemic Clinically suspect hyponatremia secondary to alcohol  abuse / hypothyroid  Likely w/ hypothyroid - treat as below    Chronic anemia H&H stable Monitor CBC  Essential HTN Amlodipine   Lasix   Losartan    Hypothyroid TSH >10 Add on T4 - WNL Given TSH >10 and sytmpoms over past few mos (wife notes more sluggish, weight gain, constipation) will start synthroid  Outpatient follow up   Anx/Dep Cymbalta    BPH Finasteride   Tamsulosin    GERD PPI   Class 1 obesity based on BMI: Body mass index is 29.84 kg/m.[name] Significantly low or high BMI is associated with higher medical risk.  Underweight - under 18  overweight - 25 to 29 obese - 30 or more Class 1 obesity: BMI of 30.0 to 34 Class 2 obesity: BMI of 35.0 to 39 Class 3 obesity: BMI of 40.0 to 49 Super Morbid Obesity: BMI 50-59 Super-super Morbid Obesity: BMI 60+ Healthy nutrition and physical activity advised as adjunct to other disease management and  risk reduction treatments    DVT prophylaxis: lovenox  IV fluids: no continuous IV fluids  Nutrition: carb modified diet Central lines / other devices: none  Code Status: FULL CODE ACP documentation reviewed: none on file in [facility]  TOC  needs: TBD Medical barriers to dispo: pending cultures and surgical recovery as he may need vascular procedure/workup too. Expected medical readiness for discharge this weekend if doing well postop             Subjective / Brief ROS:  Patient reports no concerns Denies CP/SOB.  Pain controlled.    Family Communication: wife at bedside, all concerns thoroughly addressed    Objective Findings:  Vitals:   05/23/24 1415 05/23/24 1430 05/23/24 1439 05/23/24 1520  BP: 120/65 114/62 119/63 120/67  Pulse: 72 72 72 74  Resp: 19 16 13 20   Temp:   98 F (36.7 C) 98 F (36.7 C)  TempSrc:    Oral  SpO2: 95% 96% 97% 94%  Weight:      Height:        Intake/Output Summary (Last 24 hours) at 05/23/2024 1740 Last data filed at 05/23/2024 1338 Gross per 24 hour  Intake 750 ml  Output 30 ml  Net 720 ml   Filed Weights   05/21/24 0801 05/23/24 1121  Weight: 99.8 kg 99.8 kg    Examination:  Physical Exam Constitutional:      General: He is not in acute distress.    Appearance: He is not ill-appearing.  Cardiovascular:     Rate and Rhythm: Normal rate and regular rhythm.  Pulmonary:     Effort: Pulmonary effort is normal.     Breath sounds: Normal breath sounds.  Skin:    General: Skin is warm and dry.  Neurological:     Mental Status: He is alert and oriented to person, place, and time. Mental status is at baseline.  Psychiatric:        Mood and Affect: Mood normal.        Behavior: Behavior normal.          Scheduled Medications:   amLODipine   5 mg Oral Daily   DULoxetine   60 mg Oral BID   enoxaparin  (LOVENOX ) injection  40 mg Subcutaneous Q24H   finasteride   5 mg Oral QHS   gabapentin   800 mg Oral TID   insulin  aspart  0-15 Units Subcutaneous TID WC   insulin  aspart  0-5 Units Subcutaneous QHS   insulin  glargine-yfgn  20 Units Subcutaneous Daily   [START ON 05/24/2024] levothyroxine  100 mcg Oral Q0600   losartan   25 mg Oral Daily   pantoprazole  40  mg Oral Daily   simvastatin   40 mg Oral QHS   tamsulosin   0.4 mg Oral QHS    Continuous Infusions:  piperacillin-tazobactam (ZOSYN )  IV 3.375 g (05/23/24 1608)    PRN Medications:  bisacodyl, docusate sodium, hydrOXYzine , ondansetron  **OR** ondansetron  (ZOFRAN ) IV, oxyCODONE   Antimicrobials from admission:  Anti-infectives (From admission, onward)    Start     Dose/Rate Route Frequency Ordered Stop   05/23/24 1700  piperacillin-tazobactam (ZOSYN ) IVPB 3.375 g        3.375 g 12.5 mL/hr over 240 Minutes Intravenous Every 8 hours 05/23/24 1605     05/23/24 1604  vancomycin  (VANCOCIN ) IVPB 1000 mg/200 mL premix  Status:  Discontinued        1,000 mg 200 mL/hr over 60 Minutes Intravenous Every 12 hours 05/23/24 1605 05/23/24 1657   05/23/24 1323  vancomycin  (VANCOCIN ) powder  Status:  Discontinued          As needed 05/23/24 1323 05/23/24 1349   05/22/24 0200  vancomycin  (VANCOCIN ) IVPB 1000 mg/200 mL premix  Status:  Discontinued        1,000 mg 200 mL/hr over 60 Minutes Intravenous Every 12 hours 05/21/24 1308 05/23/24 1605   05/21/24 1400  piperacillin-tazobactam (ZOSYN ) IVPB 3.375 g  Status:  Discontinued        3.375 g 12.5 mL/hr over 240 Minutes Intravenous Every 8 hours 05/21/24 1155 05/23/24 1605   05/21/24 1145  vancomycin  (VANCOCIN ) IVPB 1000 mg/200 mL premix        1,000 mg 200 mL/hr over 60 Minutes Intravenous  Once 05/21/24 1134 05/22/24 0840   05/21/24 0830  vancomycin  (VANCOCIN ) IVPB 1000 mg/200 mL premix        1,000 mg 200 mL/hr over 60 Minutes Intravenous  Once 05/21/24 0817 05/21/24 1028   05/21/24 0830  piperacillin-tazobactam (ZOSYN ) IVPB 3.375 g        3.375 g 100 mL/hr over 30 Minutes Intravenous  Once 05/21/24 0817 05/21/24 9081           Data Reviewed:  I have personally reviewed the following...  CBC: Recent Labs  Lab 05/21/24 0803 05/23/24 0354  WBC 14.1* 10.9*  NEUTROABS 11.9*  --   HGB 8.9* 8.3*  HCT 27.9* 25.5*  MCV 87.5 86.1  PLT  427* 419*   Basic Metabolic Panel: Recent Labs  Lab 05/21/24 0803 05/22/24 0400 05/23/24 0354  NA 126* 129* 130*  K 4.5 4.5 4.3  CL 90* 95* 98  CO2 22 21* 23  GLUCOSE 199* 128* 125*  BUN 27* 20 16  CREATININE 1.27* 1.12 1.02  CALCIUM 8.6* 8.2* 8.1*   GFR: Estimated Creatinine Clearance: 84.8 mL/min (by C-G formula based on SCr of 1.02 mg/dL). Liver Function Tests: Recent Labs  Lab 05/21/24 0803  AST 50*  ALT 56*  ALKPHOS 211*  BILITOT 0.9  PROT 6.8  ALBUMIN 2.7*   No results for input(s): LIPASE, AMYLASE in the last 168 hours. No results for input(s): AMMONIA in the last 168 hours. Coagulation Profile: No results for input(s): INR, PROTIME in the last 168 hours. Cardiac Enzymes: No results for input(s): CKTOTAL, CKMB, CKMBINDEX, TROPONINI in the last 168 hours. BNP (last 3 results) No results for input(s): PROBNP in the last 8760 hours. HbA1C: No results for input(s): HGBA1C in the last 72 hours. CBG: Recent Labs  Lab 05/22/24 2106 05/23/24 0730 05/23/24 1124 05/23/24 1354 05/23/24 1629  GLUCAP 173* 104* 108* 103* 136*   Lipid Profile: No results for input(s): CHOL, HDL, LDLCALC, TRIG, CHOLHDL, LDLDIRECT in the last 72 hours. Thyroid Function Tests: Recent Labs    05/21/24 0803 05/22/24 0400  TSH 10.814*  --   FREET4  --  0.94   Anemia Panel: No results for input(s): VITAMINB12, FOLATE, FERRITIN, TIBC, IRON, RETICCTPCT in the last 72 hours. Most Recent Urinalysis On File:     Component Value Date/Time   COLORURINE YELLOW (A) 05/21/2024 0836   APPEARANCEUR HAZY (A) 05/21/2024 0836   APPEARANCEUR Clear 06/27/2022 1511   LABSPEC 1.016 05/21/2024 0836   PHURINE 5.0 05/21/2024 0836   GLUCOSEU >=500 (A) 05/21/2024 0836   HGBUR NEGATIVE 05/21/2024 0836   BILIRUBINUR NEGATIVE 05/21/2024 0836   BILIRUBINUR Negative 06/27/2022 1511   KETONESUR NEGATIVE 05/21/2024 0836   PROTEINUR NEGATIVE 05/21/2024 0836    NITRITE NEGATIVE 05/21/2024 0836   LEUKOCYTESUR NEGATIVE  05/21/2024 0836   Sepsis Labs: @LABRCNTIP (procalcitonin:4,lacticidven:4) Microbiology: Recent Results (from the past 240 hours)  Aerobic/Anaerobic Culture w Gram Stain (surgical/deep wound)     Status: None (Preliminary result)   Collection Time: 05/20/24 12:13 PM   Specimen: Abscess  Result Value Ref Range Status   Specimen Description   Final    ABSCESS Performed at [facility], [address], [place], [place] [address]    Special Requests   Final    ABSCESS OF RIGHT FOOT Performed at [facility], [address] [place], [place] [address]    Gram Stain   Final    NO WBC SEEN NO ORGANISMS SEEN Performed at [facility], [address] [place], [place] [address]    Culture   Final    FEW ENTEROBACTER CLOACAE FEW GROUP B STREP(S.AGALACTIAE)ISOLATED TESTING AGAINST S. AGALACTIAE NOT ROUTINELY PERFORMED DUE TO PREDICTABILITY OF AMP/PEN/VAN SUSCEPTIBILITY. NO ANAEROBES ISOLATED; CULTURE IN PROGRESS FOR 5 DAYS    Report Status PENDING  Incomplete   Organism ID, Bacteria ENTEROBACTER CLOACAE  Final      Susceptibility   Enterobacter cloacae - MIC*    CEFEPIME  <=0.12 SENSITIVE Sensitive     ERTAPENEM <=0.12 SENSITIVE Sensitive     CIPROFLOXACIN  <=0.06 SENSITIVE Sensitive     GENTAMICIN  <=1 SENSITIVE Sensitive     MEROPENEM <=0.25 SENSITIVE Sensitive     TRIMETH /SULFA  <=20 SENSITIVE Sensitive     PIP/TAZO Value in next row Sensitive      <=4 SENSITIVEThis is a modified FDA-approved test that has been validated and its performance characteristics determined by the reporting laboratory.  This laboratory is certified under the Clinical Laboratory Improvement Amendments CLIA as qualified to perform high complexity clinical laboratory testing.    * FEW ENTEROBACTER CLOACAE  Blood Culture (routine x 2)     Status: None (Preliminary result)   Collection Time: 05/21/24  8:36 AM   Specimen: BLOOD RIGHT  ARM  Result Value Ref Range Status   Specimen Description BLOOD RIGHT ARM  Final   Special Requests   Final    BOTTLES DRAWN AEROBIC AND ANAEROBIC Blood Culture results may not be optimal due to an inadequate volume of blood received in culture bottles   Culture   Final    NO GROWTH 2 DAYS Performed at [facility], [address], [place], [place] [address]    Report Status PENDING  Incomplete  Blood Culture (routine x 2)     Status: None (Preliminary result)   Collection Time: 05/21/24  8:36 AM   Specimen: BLOOD RIGHT ARM  Result Value Ref Range Status   Specimen Description BLOOD RIGHT ARM  Final   Special Requests   Final    BOTTLES DRAWN AEROBIC AND ANAEROBIC Blood Culture results may not be optimal due to an inadequate volume of blood received in culture bottles   Culture   Final    NO GROWTH 2 DAYS Performed at [facility], [address] [place], [place] [address]    Report Status PENDING  Incomplete  MRSA Next Gen by PCR, Nasal     Status: None   Collection Time: 05/21/24  6:00 PM   Specimen: Nasal Mucosa; Nasal Swab  Result Value Ref Range Status   MRSA by PCR Next Gen NOT DETECTED NOT DETECTED Final    Comment: (NOTE) The GeneXpert MRSA Assay (FDA approved for NASAL specimens only), is one component of a comprehensive MRSA colonization surveillance program. It is not intended  to diagnose MRSA infection nor to guide or monitor treatment for MRSA infections. Test performance is not FDA approved in patients less than 67 years old. Performed at [facility], [address] [place], [place] [address]       Radiology Studies last 3 days: DG MINI C-ARM IMAGE ONLY Result Date: 05/23/2024 There is no interpretation for this exam.  This order is for images obtained during a surgical procedure.  Please See Surgeries Tab for more information regarding the procedure.   US  ARTERIAL ABI (SCREENING LOWER EXTREMITY) Result Date:  05/21/2024 EXAM: VASCULAR SCREENING 05/21/2024 11:55:16 AM CLINICAL HISTORY: Diabetic foot ulcer (HCC) [id number]. TECHNIQUE: The ABIs were also acquired. COMPARISON: None available. FINDINGS: ANKLE BRACHIAL INDEX: Right: 1.26 Left: 1.26 ABIs were within normal range. Multiphasic waveforms reported in the distal calf bilaterally. IMPRESSION: 1. No evidence of hemodynamically significant lower extremity arterial occlusive disease at rest. Electronically signed by: Katheleen Faes MD 05/21/2024 12:22 PM EDT RP Workstation: [id number]   MR FOOT RIGHT WO CONTRAST Result Date: 05/21/2024 EXAM: MRI of the right Foot without contrast. 05/21/2024 11:36:46 AM TECHNIQUE: Multiplanar multisequence MRI of the right foot was performed from the posterior subtalar joint through the stump without the administration of intravenous contrast. COMPARISON: Radiographs 05/21/2024. CLINICAL HISTORY: Right foot swelling, diabetic, suspected osteomyelitis, infection, and fever. FINDINGS: LISFRANC JOINT: Visualized Lisfranc ligament is intact. No significant Lisfranc interval widening or significant periligamentous edema. BONE MARROW: Trace endosteal edema along the distal margins of the 2nd and 3rd, probably reactive and not currently considered specific for osteomyelitis. No acute fracture or aggressive marrow replacing lesion. GREATER AND LESSER MTP JOINTS: No significant joint effusion or osseous erosions. No significant degenerative changes. Normal alignment. SOFT TISSUES: Out distal ulceration along the plantar stump margin as shown on image 18 series 6 likely accounting for the gas density in this vicinity on prior radiography. Dorsal to the 2nd and 3rd metatarsals, a 4.2 x 1.5 x 1.8 cm fluid collection is present and suspicious for abscess. This is probably spontaneously draining to the skin surface through a branching sinus tract anteriorly on images 13 through 17 of series 6 and speckled low signal internally in the fluid collection  may represent gas. Appearance compatible with abscess potentially spontaneously draining. Surrounding subcutaneous edema along the dorsum of the foot may well reflect cellulitis. Regional muscular atrophy. TENDONS: Tibialis posterior, flexor digitorum longus, and flexor hallucis longus tenosynovitis noted. IMPRESSION: 1. Dorsal fluid collection in the soft tissues above the remaining 2nd and 3rd metatarsals (4.2 x 1.5 x 1.8 cm) suspicious for abscess, potentially spontaneously draining to the anterior cutaneous surface , with surrounding subcutaneous edema compatible with cellulitis. 2. Distal ulceration along the plantar stump margin, likely accounting for the gas density seen on prior radiography. 3. Trace endosteal edema along the distal margins of the 2nd and 3rd metatarsals, likely reactive and not specific for osteomyelitis. 4. Tibialis posterior, flexor digitorum longus, and flexor hallucis longus tenosynovitis. 5. Regional muscular atrophy. Electronically signed by: Ryan Salvage MD 05/21/2024 11:50 AM EDT RP Workstation: [id number]   DG Foot Complete Right Result Date: 05/21/2024 EXAM: 3 OR MORE VIEW(S) XRAY OF THE RIGHT FOOT 05/21/2024 08:36:00 AM COMPARISON: 12/21/2016 CLINICAL HISTORY: Foot infection. 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 FINDINGS: BONES AND JOINTS: Transmetatarsal amputation of the first through fifth digits. No current bony erosive or destructive findings to specifically indicate osteomyelitis. Plantar calcaneal spur. No joint dislocation. SOFT TISSUES: Soft tissue swelling overlying the amputation stump gas density collection along the  plantar distal stump compatible with a large ulceration or localized infection involving the soft tissues, correlate with visual inspection. Atheromatous vascular calcifications. IMPRESSION: 1. Soft tissue swelling and gas along the plantar distal stump, compatible with a large ulceration or localized soft tissue infection.  2. No radiographic evidence of osteomyelitis. 3. Atheromatous vascular calcifications. 4. Plantar calcaneal spur. Electronically signed by: Ryan Salvage MD 05/21/2024 09:04 AM EDT RP Workstation: [id number]       Time spent: 50 min     Laneta Blunt, DO [facility] 05/23/2024, 5:40 PM    Dictation software may have been used to generate the above note. Typos may occur and escape review in typed/dictated notes. Please contact Dr Blunt directly for clarity if needed.  Staff may message me via secure chat in Epic  but this may not receive an immediate response,  please page me for urgent matters!  If 7PM-7AM, please contact night coverage [email or web address]

## 2024-05-23 NOTE — Anesthesia Postprocedure Evaluation (Signed)
 Anesthesia Post Note  Patient: [name]  Procedure(s) Performed: IRRIGATION AND DEBRIDEMENT FOOT (Right: Foot) LENGTHENING, TENDON, ACHILLES (Right: Leg Lower)  Patient location during evaluation: PACU Anesthesia Type: General Level of consciousness: awake and alert, oriented and patient cooperative Pain management: pain level controlled Vital Signs Assessment: post-procedure vital signs reviewed and stable Respiratory status: spontaneous breathing, nonlabored ventilation and respiratory function stable Cardiovascular status: blood pressure returned to baseline and stable Postop Assessment: adequate PO intake Anesthetic complications: no   No notable events documented.   Last Vitals:  Vitals:   05/23/24 1430 05/23/24 1439  BP: 114/62 119/63  Pulse: 72 72  Resp: 16 13  Temp:  36.7 C  SpO2: 96% 97%    Last Pain:  Vitals:   05/23/24 1439  TempSrc:   PainSc: 0-No pain                 Alfonso Ruths

## 2024-05-23 NOTE — Care Management Important Message (Signed)
 Important Message  Patient Details  Name: [name] MRN: [id number] Date of Birth: 1956/02/24   Important Message Given:  Yes - Medicare IM     Lino Wickliff W, CMA 05/23/2024, 12:51 PM

## 2024-05-24 DIAGNOSIS — L089 Local infection of the skin and subcutaneous tissue, unspecified: Secondary | ICD-10-CM | POA: Diagnosis not present

## 2024-05-24 DIAGNOSIS — E11628 Type 2 diabetes mellitus with other skin complications: Secondary | ICD-10-CM | POA: Diagnosis not present

## 2024-05-24 LAB — GLUCOSE, CAPILLARY
Glucose-Capillary: 166 mg/dL — ABNORMAL HIGH (ref 70–99)
Glucose-Capillary: 266 mg/dL — ABNORMAL HIGH (ref 70–99)
Glucose-Capillary: 99 mg/dL (ref 70–99)
Glucose-Capillary: 139 mg/dL — ABNORMAL HIGH (ref 70–99)

## 2024-05-24 MED ORDER — OXYCODONE HCL 5 MG PO TABS
10.0000 mg | ORAL_TABLET | ORAL | Status: DC | PRN
  Administered 2024-05-24 – 2024-05-26 (×7): 10 mg via ORAL
  Filled 2024-05-24 (×7): qty 2

## 2024-05-24 NOTE — TOC CM/SW Note (Signed)
 Patient is not able to walk the distance required to go the bathroom, or he/she is unable to safely negotiate stairs required to access the bathroom.  A 3in1 BSC will alleviate this problem

## 2024-05-24 NOTE — Evaluation (Signed)
 Physical Therapy Evaluation Patient Details Name: [name] MRN: [id number] DOB: 1956/04/05 Today's Date: 05/24/2024  History of Present Illness  [name] is a 68 y.o. male with medical history significant of IDDM, diabetic foot infection status post right transmetatarsal amputation, HTN, HLD, PVD, alcohol  abuse, psoriasis, Schatzki's ring, diabetic neuropathy, sent from podiatrist office for evaluation of worsening of right foot infection. Underwent the following procedures on 05/23/2024: Percutaneous tendo Achilles lengthening right lower extremity 2. I&D abscess distal transmetatarsal amputation site 3. Excision of bone distal second metatarsal 4. Excision of bone distal third metatarsal 5. Excision of bone distal fourth metatarsal 6. Intraoperative fluoroscopy without assistance of radiologist 7. Implantation antibiotic impregnated beads. NWB R LE but okay for knee scooter per Dr. Ashley   Clinical Impression  Pt received reclining in bed with wife [name] at bedside. Pt agreeable to PT evaluation and alert and oriented and able to provide detailed history. [name] contributes to history. Patient lives at home with his wife who works outside the home. Prior to hospitalization, patient ambulated independently without AD and was I with ADLs. He had an order for a knee scooter, but has not gotten it yet. He otherwise only has a single point cane. He fell and fractured his L hip February 17, 2024 but was back to ambulating independently since then. He sleeps in a recliner (not a lift chair) and uses a walk in shower for bathing. He has a handicapped height toilet, and can get into the bathroom with a walker but not a wheelchair. Upon PT evaluation, his recall of precautions is inaccurate, reporting he is supposed to weight bear as little as possible (actually NWB). He was mod I for supine to sit with head of bed elevated and requested to stand from elevated surface due to not using the bed at home. He refused  to maintain weight bearing precautions intermittently throughout session, pressing through his right foot during transfers and a little with ambulation with RW. He demonstrated the ability to ambulate a few feet with RW with weight bearing precautions intact using hopping with the L LE and B UE support on RW with CGA while ambulating. He needed min A to hold knee scooter steady while transferring to/from it or to position RW over knee scooter so he could offload his weight on R LE while going to sit in chair. He was better able to ambulate with knee scooter while maintaining weight bearing precautions, going 200 feet with it using supervision. Patient is adamant that he will go home, not to short term rehab. Patient has experienced a decline in functional mobility/independence and will need continued PT to restore function. Patient would benefit from skilled physical therapy to address impairments and functional limitations (see PT Problem List below) to work towards stated goals and return to PLOF or maximal functional independence. He requires the recommended DME to improve safety and basic mobility.        If plan is discharge home, recommend the following: A little help with walking and/or transfers;Assistance with cooking/housework;Assist for transportation;A little help with bathing/dressing/bathroom;Help with stairs or ramp for entrance   Can travel by private vehicle        Equipment Recommendations Rolling walker (2 wheels);BSC/3in1;Other (comment) (knee scooter)  Recommendations for Other Services       Functional Status Assessment Patient has had a recent decline in their functional status and demonstrates the ability to make significant improvements in function in a reasonable and predictable amount of time.  Precautions / Restrictions Precautions Precautions: Fall Recall of Precautions/Restrictions: Impaired Precaution/Restrictions Comments: pt states he is supposed to put as  little weight as possible through R LE Required Braces or Orthoses: Other Brace Other Brace: cam boot on R ankle Restrictions Weight Bearing Restrictions Per Provider Order: Yes RLE Weight Bearing Per Provider Order: Non weight bearing Other Position/Activity Restrictions: okay to weight bear through knee on knee scooter      Mobility  Bed Mobility Overal bed mobility: Modified Independent             General bed mobility comments: pt reports he sleeps in recliner, head of bed elevated during today's assessment    Transfers Overall transfer level: Needs assistance Equipment used: Rolling walker (2 wheels) Transfers: Sit to/from Stand Sit to Stand: Contact guard assist, From elevated surface, Min assist           General transfer comment: pt completed sit <> stand from elevated bed to chair with CGA using RW. He placed weight through the R LE despite heavy cuing not to and refused to pick op his foot. He did not reach back with R hand to chair when going to sit and had poor eccentric control. He then transfered sit to stand at knee rollator with min A to hold rollator still this time with better weight bearing precautions but need for second person to stabilize knee rollator. He then transfered knee rollator to chair using RW placed over the knee rollator and with min A to hold knee rollator in place. He had better use of B UE to reach back to sit down but again refused to pick up R foot and put some weight through it.    Ambulation/Gait Ambulation/Gait assistance: Contact guard assist, Supervision Gait Distance (Feet): 15 Feet Assistive device: Rolling walker (2 wheels), Knee scooter         General Gait Details: Patient ambulated from far side of bed around bed to chair using hopping technique with RW and CGA. Pt provided with heavy cuing to keep R foot off the floor and take small hops with L LE. He could do this but quickly returned to putting a little weight through the  R LE and fatigued quickly. Pateint then ambulated 200 feet with knee scooter with supervision with good ability to maintain weight bearing precautions.  Stairs            Wheelchair Mobility     Tilt Bed    Modified Rankin (Stroke Patients Only)       Balance Overall balance assessment: Needs assistance Sitting-balance support: No upper extremity supported Sitting balance-Leahy Scale: Normal     Standing balance support: Bilateral upper extremity supported, Reliant on assistive device for balance, During functional activity Standing balance-Leahy Scale: Poor Standing balance comment: Pateint reliant on AD for balance                             Pertinent Vitals/Pain Pain Assessment Pain Assessment: 0-10 Pain Score: 6  Pain Location: B hands (neuropathy) Pain Descriptors / Indicators: Other (Comment), Constant, Discomfort, Guarding (cold) Pain Intervention(s): Limited activity within patient's tolerance, Monitored during session, Patient requesting pain meds-RN notified, Repositioned    Home Living Family/patient expects to be discharged to:: Private residence Living Arrangements: Spouse/significant other Available Help at Discharge: Family;Available PRN/intermittently (wife works) Type of Home: House Home Access: Stairs to enter Entrance Stairs-Rails: Right Entrance Stairs-Number of Steps: 1 3 inch step up  to deck   Home Layout: One level Home Equipment: [place] - single point      Prior Function Prior Level of Function : History of Falls (last six months);Independent/Modified Independent             Mobility Comments: ambulateds indepdently, fell and fractured his L hip July 27 but is back to independent ambulation by now. ADLs Comments: independent     Extremity/Trunk Assessment   Upper Extremity Assessment Upper Extremity Assessment: RUE deficits/detail RUE Deficits / Details: WFL for tasks assessed except NWB RUE: Unable to fully assess  due to immobilization    Lower Extremity Assessment Lower Extremity Assessment: LLE deficits/detail LLE Deficits / Details: able to hop small hops with B UE support. unable to stand up with out putting pressure through R LE despite heavy cuing (pt not following cuing).    Cervical / Trunk Assessment Cervical / Trunk Assessment: Normal  Communication   Communication Communication: Impaired Factors Affecting Communication: Hearing impaired    Cognition Arousal: Alert Behavior During Therapy: WFL for tasks assessed/performed   PT - Cognitive impairments: No apparent impairments                         Following commands: Intact       Cueing Cueing Techniques: Verbal cues, Gestural cues     General Comments      Exercises Other Exercises Other Exercises: pt and wife [name] educated on weight bearing precautions, safe mobiltiy techniques, need/use for RW, BSC, and knee scooter, discharge reccomendations, role of PT in acute care setting.   Assessment/Plan    PT Assessment Patient needs continued PT services  PT Problem List Decreased strength;Decreased mobility;Decreased safety awareness;Decreased range of motion;Decreased knowledge of precautions;Decreased activity tolerance;Decreased balance;Decreased knowledge of use of DME;Pain;Decreased skin integrity       PT Treatment Interventions DME instruction;Therapeutic exercise;Gait training;Balance training;Stair training;Neuromuscular re-education;Functional mobility training;Therapeutic activities;Patient/family education    PT Goals (Current goals can be found in the Care Plan section)  Acute Rehab PT Goals Patient Stated Goal: go home PT Goal Formulation: With patient/family Time For Goal Achievement: 06/07/24 Potential to Achieve Goals: Good    Frequency 7X/week     Co-evaluation               AM-PAC PT 6 Clicks Mobility  Outcome Measure Help needed turning from your back to your side while in  a flat bed without using bedrails?: A Little Help needed moving from lying on your back to sitting on the side of a flat bed without using bedrails?: A Little Help needed moving to and from a bed to a chair (including a wheelchair)?: A Little Help needed standing up from a chair using your arms (e.g., wheelchair or bedside chair)?: A Little Help needed to walk in hospital room?: A Little Help needed climbing 3-5 steps with a railing? : Total 6 Click Score: 16    End of Session Equipment Utilized During Treatment: Gait belt;Other (comment) (R cam boot) Activity Tolerance: Patient limited by fatigue;Patient limited by pain Patient left: in chair;with call bell/phone within reach;with chair alarm set;with family/visitor present Nurse Communication: Mobility status PT Visit Diagnosis: History of falling (Z91.81);Other abnormalities of gait and mobility (R26.89);Pain;Muscle weakness (generalized) (M62.81) Pain - Right/Left: Right Pain - part of body: Ankle and joints of foot    Time: 8940-8857 PT Time Calculation (min) (ACUTE ONLY): 43 min   Charges:   PT Evaluation $PT Eval Moderate Complexity: 1  Mod PT Treatments $Gait Training: 8-22 mins $Therapeutic Activity: 8-22 mins PT General Charges $$ ACUTE PT VISIT: 1 Visit         Camie SAUNDERS. Juli, PT, DPT, Cert. MDT 05/24/24, 12:18 PM  [facility] [address] [place], [place] [address] P: [phone] I F: [phone]

## 2024-05-24 NOTE — TOC CM/SW Note (Signed)
 Rolling Walker: The beneficiary has a mobility limitation that significantly impairs his/her ability to participate in one or more mobility-related activities of daily living (MRADL) in the home. The patient is able to safely use the walker. The functional mobility deficit can be sufficiently resolved by use of walker.

## 2024-05-24 NOTE — TOC Initial Note (Signed)
 Transition of Care [facility]) - Initial/Assessment Note    Patient Details  Name: [name] MRN: [id number] Date of Birth: 1955-08-05  [facility] CM/SW Contact:    Victory Jackquline RAMAN, RN Phone Number: 05/24/2024, 6:16 PM  Clinical Narrative: Chart reviewed. RNCM, spoke with the patient. I introduced myself, my role, and explained that discharge planning recommendations would be discussed. PT recommended Home with Home Health/PT with DME (RW, BSC and Knee Scooter), Pt is in agreement with PT's recommendations. States he's not familiar with the [facility] agencies in the area and would like for me to send out referrals to the different agencies and then he and his wife will choose one. Referrals sent and DME ordered via [employer]. States his wife will pick him up at the time of discharge. RNCM will continue to follow for discharge planning/care coordination and update as applicable.    Expected Discharge Plan: Home w Home Health Services Barriers to Discharge: Continued Medical Work up   Patient Goals and CMS Choice            Expected Discharge Plan and Services       Living arrangements for the past 2 months: Single Family Home                 DME Arranged: 3-N-1, Walker rolling, Other see comment (Knee Scooter) DME Agency: [employer] Date DME Agency Contacted: 05/24/24   Representative spoke with at DME Agency: Contacted via email            Prior Living Arrangements/Services Living arrangements for the past 2 months: Single Family Home Lives with:: Self, Spouse Patient language and need for interpreter reviewed:: Yes Do you feel safe going back to the place where you live?: Yes      Need for Family Participation in Patient Care: Yes (Comment) Care giver support system in place?: Yes (comment)   Criminal Activity/Legal Involvement Pertinent to Current Situation/Hospitalization: No - Comment as needed  Activities of Daily Living   ADL Screening (condition at time  of admission) Independently performs ADLs?: Yes (appropriate for developmental age) Is the patient deaf or have difficulty hearing?: No Does the patient have difficulty seeing, even when wearing glasses/contacts?: No Does the patient have difficulty concentrating, remembering, or making decisions?: No  Permission Sought/Granted                  Emotional Assessment Appearance:: Well-Groomed, Appears stated age Attitude/Demeanor/Rapport: Gracious, Self-Confident Affect (typically observed): Calm, Quiet, Pleasant Orientation: : Oriented to Self, Oriented to Place, Oriented to  Time, Oriented to Situation Alcohol  / Substance Use: Not Applicable Psych Involvement: No (comment)  Admission diagnosis:  Hyponatremia [E87.1] Diabetic foot infection (HCC) [Z88.371, L08.9] Wound of right foot [S91.301A] Patient Active Problem List   Diagnosis Date Noted   Diabetic foot infection (HCC) 05/21/2024   Iron deficiency anemia 05/07/2024   Other constipation 05/02/2024   Amputated toe of left foot 03/05/2024   PAD (peripheral artery disease) 07/10/2023   Cold hands 06/06/2023   Diabetes mellitus treated with insulin  and oral medication (HCC) 06/06/2023   Alcohol  use 05/05/2023   Carpal tunnel syndrome of left wrist 01/05/2023   Carpal tunnel syndrome of right wrist 01/01/2023   Numbness of hand 01/01/2023   BPH (benign prostatic hyperplasia) 12/20/2022   B12 deficiency 10/31/2022   Allergic reaction to sulfonamide 06/19/2022   Symptomatic anemia 06/12/2022   Elevated PSA 02/14/2019   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 (HCC) 03/12/2018  Diabetic foot ulcer with osteomyelitis (HCC) 10/29/2015   Adenomatous polyp    Routine general medical examination at a health care facility 04/17/2011   Osteoarthritis 03/17/2007   DM (diabetes mellitus) type II controlled, neurological manifestation (HCC) 03/13/2007   Hyperlipemia 03/13/2007   GOUT 03/13/2007   Essential  hypertension, benign 03/13/2007   GERD 03/13/2007   PSORIASIS 03/13/2007   PCP:  Jimmy Charlie FERNS, MD Pharmacy:   [id number] [address], [place] - [address] AT [facility] [address] [place] [address] Phone: [phone] Fax: [phone]     Social Drivers of Health (SDOH) Social History: SDOH Screenings   Food Insecurity: No Food Insecurity (05/21/2024)  Housing: Low Risk  (05/21/2024)  Transportation Needs: No Transportation Needs (05/21/2024)  Utilities: Not At Risk (05/21/2024)  Alcohol  Screen: Low Risk  (03/04/2024)  Depression (PHQ2-9): Medium Risk (05/02/2024)  Financial Resource Strain: Low Risk  (05/13/2024)   Received from [facility]  Physical Activity: Inactive (03/03/2024)  Social Connections: Unknown (05/21/2024)  Stress: No Stress Concern Present (03/03/2024)  Tobacco Use: Low Risk  (05/21/2024)  Health Literacy: Adequate Health Literacy (03/04/2024)   SDOH Interventions:     Readmission Risk Interventions     No data to display

## 2024-05-24 NOTE — Progress Notes (Addendum)
 Daily Progress Note   Subjective  - 1 Day Post-Op  Follow-up right foot debridement with tendo Achilles lengthening.  Patient doing well.  Resting bed comfortably.  No complaints  Objective Vitals:   05/23/24 2010 05/24/24 0051 05/24/24 0501 05/24/24 0733  BP: 120/61 122/74 127/69 131/67  Pulse: 81 72 70 86  Resp: 18 18 18 17   Temp: 98 F (36.7 C) 97.6 F (36.4 C) 98.7 F (37.1 C) 97.9 F (36.6 C)  TempSrc: Oral Oral Oral   SpO2: 97% 96% 97% 97%  Weight:      Height:        Physical Exam: All incisions well coapted.  No active purulent drainage.  Dressings changed.  Erythema is markedly improved.  Laboratory CBC    Component Value Date/Time   WBC 10.9 (H) 05/23/2024 0354   HGB 8.3 (L) 05/23/2024 0354   HGB 11.2 (L) 09/06/2023 0856   HCT 25.5 (L) 05/23/2024 0354   HCT 34.4 (L) 09/06/2023 0856   PLT 419 (H) 05/23/2024 0354   PLT 314 09/06/2023 0856    BMET    Component Value Date/Time   NA 130 (L) 05/23/2024 0354   NA 137 09/06/2023 0856   K 4.3 05/23/2024 0354   CL 98 05/23/2024 0354   CO2 23 05/23/2024 0354   GLUCOSE 125 (H) 05/23/2024 0354   BUN 16 05/23/2024 0354   BUN 13 09/06/2023 0856   CREATININE 1.02 05/23/2024 0354   CREATININE 1.23 01/03/2013 1212   CALCIUM 8.1 (L) 05/23/2024 0354   GFRNONAA >60 05/23/2024 0354   GFRNONAA >60 01/03/2013 1212   GFRAA 95 03/25/2020 0755   GFRAA >60 01/03/2013 1212    Assessment/Planning: Abscess status post I&D and tendoachilles lengthening  Wound looks good today.  Will continue to monitor skin flaps.  Continue to monitor cultures.  Suspect in 1 to 2 days patient will be stable for discharge. Discussed strict need for nonweightbearing to the right foot. Awaiting cultures at this point.  Outpatient cultures showing Enterobacter.  Awaiting operative cultures, pending.  Ashley Soulier A  05/24/2024, 9:38 AM

## 2024-05-24 NOTE — Progress Notes (Signed)
 PROGRESS NOTE    [name]   FMW:[id number] DOB: [birth year]  DOA: 05/21/2024 Date of Service: 05/24/24 which is hospital day 3  PCP: Jimmy Charlie FERNS, MD    Hospital course / significant events:   [name] is a 68 y.o. male with medical history significant of IDDM, diabetic foot infection status post right transmetatarsal amputation, HTN, HLD, PVD, alcohol  abuse, psoriasis, Schatzki's ring, diabetic neuropathy, sent from podiatrist office for evaluation of worsening of right foot infection. Patient has a chronic right foot ulcer developed 2 to 3 months ago, for which he has been followed by outpatient wound care and podiatry. This time he started noticed increasing rash and swelling of the right foot, and started to have low-grade fever last 2 days with Tmax 101 last night. He went to see podiatry 10/29 who sent patient to ED   10/29: MRI showed right foot abscess and tenosynovitis but no signs of osteomyelitis - admitted to hospitalist. Vascular consult - would proceed w/ transmetatarsal amputation but low threshold for angio +/- BKA next week if concern for severe small vessel disease / nonhealing  10/30: stable, await surgery tomorrow, remain on IV abx 10/31: underwent surgery as below w/ I&D and bone excision, implantation abx beads. Pend cultures/pathology but podiatry will follow in office  11/01: podiatry Continue to monitor cultures. Suspect in 1 to 2 days patient will be stable for discharge. PT/OT following.       Consultants:  Podiatry Vascular surgery   Procedures/Surgeries: 05/23/24 1. Percutaneous tendo Achilles lengthening right lower extremity 2. I&D abscess distal transmetatarsal amputation site 3. Excision of bone distal second metatarsal 4. Excision of bone distal third metatarsal 5. Excision of bone distal fourth metatarsal 6. Intraoperative fluoroscopy without assistance of radiologist 7. Implantation antibiotic impregnated beads       ASSESSMENT &  PLAN:   Right foot diabetic ulcer infection - sepsis RULED OUT Right foot abscess formation Right foot infective tenosynovitis PAD with right lower extremity transmetatarsal amputation from October 2024.  vancomycin  and Zosyn  --> vanc dc today based on outpatient cultures --> anticipate cipro  + augmentin  on discharge  S/p I&D  Continue to monitor cultures. Suspect in 1 to 2 days patient will be stable for discharge.  PT/OT following.     PVD/PAD Chronic claudication HLD Arterial Doppler no concerns Vascular consult - more concern for small vessel disease, can get angio if surgical intervention gives any concern for insufficient blood flow, and would consider for BKA  Continue statin Not on aspirin due to history of NSAIDs allergy    IDDM with hyperglycemia Hx nonadherence to recommended therapy  Lantus  SSI Hold home farxiga , metformin , mounjaro  for now resume on discharge    Diabetic neuropathy Gabapentin    Chronic hyponatremia Euvolemic Clinically suspect hyponatremia secondary to alcohol  abuse / hypothyroid  Likely w/ hypothyroid - treat as below  EtOH abstinence encouraged  Monitor BMP outpatient    Chronic anemia H&H stable Monitor CBC  Essential HTN Amlodipine   Lasix   Losartan    Hypothyroid TSH >10 (Not probably also contributing to hyponatremia)  Add on T4 - WNL TSH >10 and sytmpoms over past few mos (wife notes more sluggish, weight gain, constipation) will start synthroid  Outpatient follow up   Anx/Dep Cymbalta    BPH Finasteride   Tamsulosin    GERD PPI   Class 1 obesity based on BMI: Body mass index is 29.84 kg/m.[name] Significantly low or high BMI is associated with higher medical risk.  Underweight - under 18  overweight - 25 to 29 obese - 30 or more Class 1 obesity: BMI of 30.0 to 34 Class 2 obesity: BMI of 35.0 to 39 Class 3 obesity: BMI of 40.0 to 49 Super Morbid Obesity: BMI 50-59 Super-super Morbid Obesity: BMI 60+ Healthy nutrition  and physical activity advised as adjunct to other disease management and risk reduction treatments    DVT prophylaxis: lovenox  IV fluids: no continuous IV fluids  Nutrition: carb modified diet Central lines / other devices: none  Code Status: FULL CODE ACP documentation reviewed: none on file in [facility]  TOC needs: TBD Medical barriers to dispo: pending cultures and surgical recovery / podiatry clearance              Subjective / Brief ROS:  Patient reports no concerns Denies CP/SOB.  Pain controlled.    Family Communication: none at bedside    Objective Findings:  Vitals:   05/23/24 2010 05/24/24 0051 05/24/24 0501 05/24/24 0733  BP: 120/61 122/74 127/69 131/67  Pulse: 81 72 70 86  Resp: 18 18 18 17   Temp: 98 F (36.7 C) 97.6 F (36.4 C) 98.7 F (37.1 C) 97.9 F (36.6 C)  TempSrc: Oral Oral Oral   SpO2: 97% 96% 97% 97%  Weight:      Height:        Intake/Output Summary (Last 24 hours) at 05/24/2024 1336 Last data filed at 05/24/2024 0900 Gross per 24 hour  Intake 1124.27 ml  Output 1300 ml  Net -175.73 ml   Filed Weights   05/21/24 0801 05/23/24 1121  Weight: 99.8 kg 99.8 kg    Examination:  Physical Exam Constitutional:      General: He is not in acute distress.    Appearance: He is not ill-appearing.  Cardiovascular:     Rate and Rhythm: Normal rate and regular rhythm.  Pulmonary:     Effort: Pulmonary effort is normal.     Breath sounds: Normal breath sounds.  Skin:    General: Skin is warm and dry.  Neurological:     Mental Status: He is alert and oriented to person, place, and time. Mental status is at baseline.  Psychiatric:        Mood and Affect: Mood normal.        Behavior: Behavior normal.          Scheduled Medications:   amLODipine   5 mg Oral Daily   DULoxetine   60 mg Oral BID   enoxaparin  (LOVENOX ) injection  40 mg Subcutaneous Q24H   finasteride   5 mg Oral QHS   gabapentin   800 mg Oral TID   insulin  aspart   0-15 Units Subcutaneous TID WC   insulin  aspart  0-5 Units Subcutaneous QHS   insulin  glargine-yfgn  20 Units Subcutaneous Daily   levothyroxine  100 mcg Oral Q0600   losartan   25 mg Oral Daily   pantoprazole  40 mg Oral Daily   simvastatin   40 mg Oral QHS   tamsulosin   0.4 mg Oral QHS    Continuous Infusions:  piperacillin-tazobactam (ZOSYN )  IV 3.375 g (05/24/24 0838)    PRN Medications:  bisacodyl, docusate sodium, hydrOXYzine , ondansetron  **OR** ondansetron  (ZOFRAN ) IV, oxyCODONE   Antimicrobials from admission:  Anti-infectives (From admission, onward)    Start     Dose/Rate Route Frequency Ordered Stop   05/23/24 1700  piperacillin-tazobactam (ZOSYN ) IVPB 3.375 g        3.375 g 12.5 mL/hr over 240 Minutes Intravenous Every 8 hours 05/23/24 1605  05/23/24 1604  vancomycin  (VANCOCIN ) IVPB 1000 mg/200 mL premix  Status:  Discontinued        1,000 mg 200 mL/hr over 60 Minutes Intravenous Every 12 hours 05/23/24 1605 05/23/24 1657   05/23/24 1323  vancomycin  (VANCOCIN ) powder  Status:  Discontinued          As needed 05/23/24 1323 05/23/24 1349   05/22/24 0200  vancomycin  (VANCOCIN ) IVPB 1000 mg/200 mL premix  Status:  Discontinued        1,000 mg 200 mL/hr over 60 Minutes Intravenous Every 12 hours 05/21/24 1308 05/23/24 1605   05/21/24 1400  piperacillin-tazobactam (ZOSYN ) IVPB 3.375 g  Status:  Discontinued        3.375 g 12.5 mL/hr over 240 Minutes Intravenous Every 8 hours 05/21/24 1155 05/23/24 1605   05/21/24 1145  vancomycin  (VANCOCIN ) IVPB 1000 mg/200 mL premix        1,000 mg 200 mL/hr over 60 Minutes Intravenous  Once 05/21/24 1134 05/22/24 0840   05/21/24 0830  vancomycin  (VANCOCIN ) IVPB 1000 mg/200 mL premix        1,000 mg 200 mL/hr over 60 Minutes Intravenous  Once 05/21/24 0817 05/21/24 1028   05/21/24 0830  piperacillin-tazobactam (ZOSYN ) IVPB 3.375 g        3.375 g 100 mL/hr over 30 Minutes Intravenous  Once 05/21/24 0817 05/21/24 9081            Data Reviewed:  I have personally reviewed the following...  CBC: Recent Labs  Lab 05/21/24 0803 05/23/24 0354  WBC 14.1* 10.9*  NEUTROABS 11.9*  --   HGB 8.9* 8.3*  HCT 27.9* 25.5*  MCV 87.5 86.1  PLT 427* 419*   Basic Metabolic Panel: Recent Labs  Lab 05/21/24 0803 05/22/24 0400 05/23/24 0354  NA 126* 129* 130*  K 4.5 4.5 4.3  CL 90* 95* 98  CO2 22 21* 23  GLUCOSE 199* 128* 125*  BUN 27* 20 16  CREATININE 1.27* 1.12 1.02  CALCIUM 8.6* 8.2* 8.1*   GFR: Estimated Creatinine Clearance: 84.8 mL/min (by C-G formula based on SCr of 1.02 mg/dL). Liver Function Tests: Recent Labs  Lab 05/21/24 0803  AST 50*  ALT 56*  ALKPHOS 211*  BILITOT 0.9  PROT 6.8  ALBUMIN 2.7*   No results for input(s): LIPASE, AMYLASE in the last 168 hours. No results for input(s): AMMONIA in the last 168 hours. Coagulation Profile: No results for input(s): INR, PROTIME in the last 168 hours. Cardiac Enzymes: No results for input(s): CKTOTAL, CKMB, CKMBINDEX, TROPONINI in the last 168 hours. BNP (last 3 results) No results for input(s): PROBNP in the last 8760 hours. HbA1C: No results for input(s): HGBA1C in the last 72 hours. CBG: Recent Labs  Lab 05/23/24 1354 05/23/24 1629 05/23/24 2051 05/24/24 0733 05/24/24 1116  GLUCAP 103* 136* 225* 166* 266*   Lipid Profile: No results for input(s): CHOL, HDL, LDLCALC, TRIG, CHOLHDL, LDLDIRECT in the last 72 hours. Thyroid Function Tests: Recent Labs    05/22/24 0400  FREET4 0.94   Anemia Panel: No results for input(s): VITAMINB12, FOLATE, FERRITIN, TIBC, IRON, RETICCTPCT in the last 72 hours. Most Recent Urinalysis On File:     Component Value Date/Time   COLORURINE YELLOW (A) 05/21/2024 0836   APPEARANCEUR HAZY (A) 05/21/2024 0836   APPEARANCEUR Clear 06/27/2022 1511   LABSPEC 1.016 05/21/2024 0836   PHURINE 5.0 05/21/2024 0836   GLUCOSEU >=500 (A) 05/21/2024 0836    HGBUR NEGATIVE 05/21/2024 0836   BILIRUBINUR NEGATIVE  05/21/2024 0836   BILIRUBINUR Negative 06/27/2022 1511   KETONESUR NEGATIVE 05/21/2024 0836   PROTEINUR NEGATIVE 05/21/2024 0836   NITRITE NEGATIVE 05/21/2024 0836   LEUKOCYTESUR NEGATIVE 05/21/2024 0836   Sepsis Labs: @LABRCNTIP (procalcitonin:4,lacticidven:4) Microbiology: Recent Results (from the past 240 hours)  Aerobic/Anaerobic Culture w Gram Stain (surgical/deep wound)     Status: None (Preliminary result)   Collection Time: 05/20/24 12:13 PM   Specimen: Abscess  Result Value Ref Range Status   Specimen Description   Final    ABSCESS Performed at [facility], [address] [place], [place] [address]    Special Requests   Final    ABSCESS OF RIGHT FOOT Performed at [facility], [address] [place], [place] [address]    Gram Stain   Final    NO WBC SEEN NO ORGANISMS SEEN Performed at [facility], [address], [place], [place] [address]    Culture   Final    FEW ENTEROBACTER CLOACAE FEW GROUP B STREP(S.AGALACTIAE)ISOLATED TESTING AGAINST S. AGALACTIAE NOT ROUTINELY PERFORMED DUE TO PREDICTABILITY OF AMP/PEN/VAN SUSCEPTIBILITY. NO ANAEROBES ISOLATED; CULTURE IN PROGRESS FOR 5 DAYS    Report Status PENDING  Incomplete   Organism ID, Bacteria ENTEROBACTER CLOACAE  Final      Susceptibility   Enterobacter cloacae - MIC*    CEFEPIME  <=0.12 SENSITIVE Sensitive     ERTAPENEM <=0.12 SENSITIVE Sensitive     CIPROFLOXACIN  <=0.06 SENSITIVE Sensitive     GENTAMICIN  <=1 SENSITIVE Sensitive     MEROPENEM <=0.25 SENSITIVE Sensitive     TRIMETH /SULFA  <=20 SENSITIVE Sensitive     PIP/TAZO Value in next row Sensitive      <=4 SENSITIVEThis is a modified FDA-approved test that has been validated and its performance characteristics determined by the reporting laboratory.  This laboratory is certified under the Clinical Laboratory Improvement Amendments CLIA as qualified to perform high complexity  clinical laboratory testing.    * FEW ENTEROBACTER CLOACAE  Blood Culture (routine x 2)     Status: None (Preliminary result)   Collection Time: 05/21/24  8:36 AM   Specimen: BLOOD RIGHT ARM  Result Value Ref Range Status   Specimen Description BLOOD RIGHT ARM  Final   Special Requests   Final    BOTTLES DRAWN AEROBIC AND ANAEROBIC Blood Culture results may not be optimal due to an inadequate volume of blood received in culture bottles   Culture   Final    NO GROWTH 3 DAYS Performed at [facility], [address], [place], [place] [address]    Report Status PENDING  Incomplete  Blood Culture (routine x 2)     Status: None (Preliminary result)   Collection Time: 05/21/24  8:36 AM   Specimen: BLOOD RIGHT ARM  Result Value Ref Range Status   Specimen Description BLOOD RIGHT ARM  Final   Special Requests   Final    BOTTLES DRAWN AEROBIC AND ANAEROBIC Blood Culture results may not be optimal due to an inadequate volume of blood received in culture bottles   Culture   Final    NO GROWTH 3 DAYS Performed at [facility], [address] [place], [place] [address]    Report Status PENDING  Incomplete  MRSA Next Gen by PCR, Nasal     Status: None   Collection Time: 05/21/24  6:00 PM   Specimen: Nasal Mucosa; Nasal Swab  Result Value Ref Range Status   MRSA by PCR Next Gen NOT DETECTED NOT DETECTED  Final    Comment: (NOTE) The GeneXpert MRSA Assay (FDA approved for NASAL specimens only), is one component of a comprehensive MRSA colonization surveillance program. It is not intended to diagnose MRSA infection nor to guide or monitor treatment for MRSA infections. Test performance is not FDA approved in patients less than 68 years old. Performed at [facility], [address] [place], [place] [address]   Aerobic/Anaerobic Culture w Gram Stain (surgical/deep wound)     Status: None (Preliminary result)   Collection Time: 05/23/24  1:00 PM   Specimen: Path  Tissue  Result Value Ref Range Status   Specimen Description   Final    ULCER RIGHT FOOT ULCER Performed at [facility], [address], [place], [place] [address]    Special Requests   Final    NONE Performed at [facility], [address] [place], [place] [address]    Gram Stain   Final    RARE WBC PRESENT, PREDOMINANTLY PMN NO ORGANISMS SEEN    Culture   Final    RARE GRAM NEGATIVE RODS CULTURE REINCUBATED FOR BETTER GROWTH Performed at [facility], [address], [place], [place] [address]    Report Status PENDING  Incomplete      Radiology Studies last 3 days: DG MINI C-ARM IMAGE ONLY Result Date: 05/23/2024 There is no interpretation for this exam.  This order is for images obtained during a surgical procedure.  Please See Surgeries Tab for more information regarding the procedure.   US  ARTERIAL ABI (SCREENING LOWER EXTREMITY) Result Date: 05/21/2024 EXAM: VASCULAR SCREENING 05/21/2024 11:55:16 AM CLINICAL HISTORY: Diabetic foot ulcer (HCC) [id number]. TECHNIQUE: The ABIs were also acquired. COMPARISON: None available. FINDINGS: ANKLE BRACHIAL INDEX: Right: 1.26 Left: 1.26 ABIs were within normal range. Multiphasic waveforms reported in the distal calf bilaterally. IMPRESSION: 1. No evidence of hemodynamically significant lower extremity arterial occlusive disease at rest. Electronically signed by: Katheleen Faes MD 05/21/2024 12:22 PM EDT RP Workstation: [id number]   MR FOOT RIGHT WO CONTRAST Result Date: 05/21/2024 EXAM: MRI of the right Foot without contrast. 05/21/2024 11:36:46 AM TECHNIQUE: Multiplanar multisequence MRI of the right foot was performed from the posterior subtalar joint through the stump without the administration of intravenous contrast. COMPARISON: Radiographs 05/21/2024. CLINICAL HISTORY: Right foot swelling, diabetic, suspected osteomyelitis, infection, and fever. FINDINGS: LISFRANC JOINT: Visualized Lisfranc ligament is  intact. No significant Lisfranc interval widening or significant periligamentous edema. BONE MARROW: Trace endosteal edema along the distal margins of the 2nd and 3rd, probably reactive and not currently considered specific for osteomyelitis. No acute fracture or aggressive marrow replacing lesion. GREATER AND LESSER MTP JOINTS: No significant joint effusion or osseous erosions. No significant degenerative changes. Normal alignment. SOFT TISSUES: Out distal ulceration along the plantar stump margin as shown on image 18 series 6 likely accounting for the gas density in this vicinity on prior radiography. Dorsal to the 2nd and 3rd metatarsals, a 4.2 x 1.5 x 1.8 cm fluid collection is present and suspicious for abscess. This is probably spontaneously draining to the skin surface through a branching sinus tract anteriorly on images 13 through 17 of series 6 and speckled low signal internally in the fluid collection may represent gas. Appearance compatible with abscess potentially spontaneously draining. Surrounding subcutaneous edema along the dorsum of the foot may well reflect cellulitis. Regional muscular atrophy. TENDONS: Tibialis posterior, flexor digitorum longus, and flexor hallucis longus tenosynovitis noted. IMPRESSION: 1. Dorsal fluid collection in the soft tissues above the  remaining 2nd and 3rd metatarsals (4.2 x 1.5 x 1.8 cm) suspicious for abscess, potentially spontaneously draining to the anterior cutaneous surface , with surrounding subcutaneous edema compatible with cellulitis. 2. Distal ulceration along the plantar stump margin, likely accounting for the gas density seen on prior radiography. 3. Trace endosteal edema along the distal margins of the 2nd and 3rd metatarsals, likely reactive and not specific for osteomyelitis. 4. Tibialis posterior, flexor digitorum longus, and flexor hallucis longus tenosynovitis. 5. Regional muscular atrophy. Electronically signed by: Ryan Salvage MD 05/21/2024  11:50 AM EDT RP Workstation: [id number]   DG Foot Complete Right Result Date: 05/21/2024 EXAM: 3 OR MORE VIEW(S) XRAY OF THE RIGHT FOOT 05/21/2024 08:36:00 AM COMPARISON: 12/21/2016 CLINICAL HISTORY: Foot infection. 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 FINDINGS: BONES AND JOINTS: Transmetatarsal amputation of the first through fifth digits. No current bony erosive or destructive findings to specifically indicate osteomyelitis. Plantar calcaneal spur. No joint dislocation. SOFT TISSUES: Soft tissue swelling overlying the amputation stump gas density collection along the plantar distal stump compatible with a large ulceration or localized infection involving the soft tissues, correlate with visual inspection. Atheromatous vascular calcifications. IMPRESSION: 1. Soft tissue swelling and gas along the plantar distal stump, compatible with a large ulceration or localized soft tissue infection. 2. No radiographic evidence of osteomyelitis. 3. Atheromatous vascular calcifications. 4. Plantar calcaneal spur. Electronically signed by: Ryan Salvage MD 05/21/2024 09:04 AM EDT RP Workstation: [id number]       Time spent: 50 min     Laneta Blunt, DO [facility] 05/24/2024, 1:36 PM    Dictation software may have been used to generate the above note. Typos may occur and escape review in typed/dictated notes. Please contact Dr Blunt directly for clarity if needed.  Staff may message me via secure chat in Epic  but this may not receive an immediate response,  please page me for urgent matters!  If 7PM-7AM, please contact night coverage [email or web address]

## 2024-05-25 ENCOUNTER — Other Ambulatory Visit: Payer: Self-pay

## 2024-05-25 ENCOUNTER — Encounter: Payer: Self-pay | Admitting: Internal Medicine

## 2024-05-25 DIAGNOSIS — L089 Local infection of the skin and subcutaneous tissue, unspecified: Secondary | ICD-10-CM | POA: Diagnosis not present

## 2024-05-25 DIAGNOSIS — E11628 Type 2 diabetes mellitus with other skin complications: Secondary | ICD-10-CM | POA: Diagnosis not present

## 2024-05-25 LAB — GLUCOSE, CAPILLARY
Glucose-Capillary: 99 mg/dL (ref 70–99)
Glucose-Capillary: 137 mg/dL — ABNORMAL HIGH (ref 70–99)
Glucose-Capillary: 119 mg/dL — ABNORMAL HIGH (ref 70–99)
Glucose-Capillary: 157 mg/dL — ABNORMAL HIGH (ref 70–99)

## 2024-05-25 LAB — AEROBIC/ANAEROBIC CULTURE W GRAM STAIN (SURGICAL/DEEP WOUND): Gram Stain: NONE SEEN

## 2024-05-25 MED ORDER — CEFADROXIL 500 MG PO CAPS
500.0000 mg | ORAL_CAPSULE | Freq: Two times a day (BID) | ORAL | 0 refills | Status: AC
  Filled 2024-05-25: qty 20, 10d supply, fill #0

## 2024-05-25 MED ORDER — BISACODYL 5 MG PO TBEC
5.0000 mg | DELAYED_RELEASE_TABLET | Freq: Every day | ORAL | Status: DC
  Administered 2024-05-26: 5 mg via ORAL
  Filled 2024-05-25 (×2): qty 1

## 2024-05-25 MED ORDER — CIPROFLOXACIN HCL 500 MG PO TABS
500.0000 mg | ORAL_TABLET | Freq: Two times a day (BID) | ORAL | 0 refills | Status: AC
  Filled 2024-05-25: qty 20, 10d supply, fill #0

## 2024-05-25 MED ORDER — MAGNESIUM HYDROXIDE 400 MG/5ML PO SUSP
30.0000 mL | Freq: Once | ORAL | Status: AC
  Administered 2024-05-25: 30 mL via ORAL
  Filled 2024-05-25: qty 30

## 2024-05-25 MED ORDER — LEVOTHYROXINE SODIUM 100 MCG PO TABS: 100.0000 ug | ORAL_TABLET | Freq: Every day | ORAL | 0 refills | Status: DC

## 2024-05-25 MED ORDER — FLEET ENEMA RE ENEM: 1.0000 | ENEMA | Freq: Every day | RECTAL | Status: DC | PRN

## 2024-05-25 NOTE — Progress Notes (Signed)
 PT Cancellation Note  Patient Details Name: [name] MRN: [id number] DOB: 1956/06/15   Cancelled Treatment:    Reason Eval/Treat Not Completed: Other (comment)  Offered and encouraged x 3.  Pt reported feeling poorly and needing to have a BM.  Encouraged mobility to assist with bowel movements but he continued to decline.  Will return in AM per his request.   Lauraine Gills 05/25/2024, 1:56 PM

## 2024-05-25 NOTE — Discharge Summary (Signed)
 Physician Discharge Summary   Patient: [name] MRN: [id number]  DOB: [birth year]   Admit:     Date of Admission: 05/21/2024 Admitted from: home   Discharge: Date of discharge: 05/25/24 order placed  Disposition: Home health Condition at discharge: good  CODE STATUS: FULL CODE     Discharge Physician: Laneta Blunt, DO [facility]     PCP: Jimmy Charlie FERNS, MD  Recommendations for Outpatient Follow-up:  Follow up with PCP Jimmy Charlie FERNS, MD in 1-2 weeks Follow up with Dr Ashley podiatry within 1 week    Discharge Instructions     Diet - low sodium heart healthy   Complete by: As directed    Discharge wound care:   Complete by: As directed    Per podiatry recommendations   Increase activity slowly   Complete by: As directed          Discharge Diagnoses: Principal Problem:   Diabetic foot infection (HCC) Active Problems:   Diabetic foot ulcer with osteomyelitis (HCC)   PAD (peripheral artery disease)        Hospital course / significant events:   Bradley PANEBIANCO is a 68 y.o. male with medical history significant of IDDM, diabetic foot infection status post right transmetatarsal amputation, HTN, HLD, PVD, alcohol  abuse, psoriasis, Schatzki's ring, diabetic neuropathy, sent from podiatrist office for evaluation of worsening of right foot infection. Patient has a chronic right foot ulcer developed 2 to 3 months ago, for which he has been followed by outpatient wound care and podiatry. This time he started noticed increasing rash and swelling of the right foot, and started to have low-grade fever last 2 days with Tmax 101 last night. He went to see podiatry 10/29 who sent patient to ED   10/29: MRI showed right foot abscess and tenosynovitis but no signs of osteomyelitis - admitted to hospitalist. Vascular consult - would proceed w/ transmetatarsal amputation but low threshold for angio +/- BKA next week if concern for severe small vessel  disease / nonhealing  10/30: stable, await surgery tomorrow, remain on IV abx 10/31: underwent surgery as below w/ I&D and bone excision, implantation abx beads. Pend cultures/pathology but podiatry will follow in office  11/01: podiatry Continue to monitor cultures. Suspect in 1 to 2 days patient will be stable for discharge. PT/OT following. 11/02: per podiatry ok for dicsharge. NWB. OK for Duricef 500 mg twice daily + Cipro  500 mg twice daily. x10 days. Wife states she was told could dc tomorrow so does not have equipment or house ready yet. Pt is medically stable, dc orders placed, abx and synthroid rx in place.      Consultants:  Podiatry Vascular surgery   Procedures/Surgeries: 05/23/24 1. Percutaneous tendo Achilles lengthening right lower extremity 2. I&D abscess distal transmetatarsal amputation site 3. Excision of bone distal second metatarsal 4. Excision of bone distal third metatarsal 5. Excision of bone distal fourth metatarsal 6. Intraoperative fluoroscopy without assistance of radiologist 7. Implantation antibiotic impregnated beads       ASSESSMENT & PLAN:   Right foot diabetic ulcer infection - sepsis RULED OUT Right foot abscess formation Right foot infective tenosynovitis PAD with right lower extremity transmetatarsal amputation from October 2024.  S/p I&D see above vancomycin  and Zosyn  -->  Duricef 500 mg twice daily + Cipro  500 mg twice daily. x10 days.   Patient will remain nonweightbearing.  dressing changes - simple dry dressing would suffice.  This can be changed 3 times  a week.  Podiatry discussed this with the patient as well as his wife yesterday and they felt comfortable to perform this. Podiatry recommended follow-up with Dr Ashley in 1 week. From medical and podiatry standpoint patient is stable for discharge but wife is declining to leave today stating she was told he could go tomorrow so she doesn't have the house ready yet for him / DME not all in  place. Made TOC aware.   PVD/PAD Chronic claudication HLD Arterial Doppler no concerns Vascular consult - more concern for small vessel disease, can get angio if surgical intervention gives any concern for insufficient blood flow, and would consider for BKA - podiatry to follow outpatient and involve vascular as needed  Continue statin Not on aspirin due to history of NSAIDs allergy    IDDM with hyperglycemia Hx nonadherence to recommended therapy  Resume home meds    Diabetic neuropathy Gabapentin    Chronic hyponatremia Euvolemic Clinically suspect hyponatremia secondary to alcohol  abuse / hypothyroid  Likely w/ hypothyroid - treat as below  EtOH abstinence encouraged  Monitor BMP outpatient    Chronic anemia H&H stable Monitor CBC outpatient   Essential HTN Amlodipine   Lasix   Losartan    Hypothyroid TSH >10 (Not probably also contributing to hyponatremia)  Add on T4 - WNL TSH >10 and sytmpoms over past few mos (wife notes more sluggish, weight gain, constipation) will start synthroid  Outpatient follow up   Anx/Dep Cymbalta    BPH Finasteride   Tamsulosin    GERD PPI   Class 1 obesity based on BMI: Body mass index is 29.84 kg/m.[name] Significantly low or high BMI is associated with higher medical risk.  Underweight - under 18  overweight - 25 to 29 obese - 30 or more Class 1 obesity: BMI of 30.0 to 34 Class 2 obesity: BMI of 35.0 to 39 Class 3 obesity: BMI of 40.0 to 49 Super Morbid Obesity: BMI 50-59 Super-super Morbid Obesity: BMI 60+ Healthy nutrition and physical activity advised as adjunct to other disease management and risk reduction treatments            Discharge Instructions  Allergies as of 05/25/2024       Reactions   Glipizide Other (See Comments)   REACTION: wt. gain, hands tingling   Lisinopril  Other (See Comments)   HYPERKALEMIA   Ramipril Cough   Ibuprofen    UNSPECIFIED REACTION    Bactrim  [sulfamethoxazole -trimethoprim ]  Nausea And Vomiting, Rash        Medication List     STOP taking these medications    omeprazole  20 MG capsule Commonly known as: PRILOSEC       TAKE these medications    Alcohol  Prep 70 % Pads USE 1 PREP PAD TWICE DAILY   Alpha-Lipoic Acid 600 MG Tabs Take by mouth. 3 in am and 1 in pm   amLODipine  5 MG tablet Commonly known as: NORVASC  TAKE 1 TABLET(5 MG) BY MOUTH DAILY   B-D UF III MINI PEN NEEDLES 31G X 5 MM Misc Generic drug: Insulin  Pen Needle USE AS DIRECTED DAILY   BD SafetyGlide Syringe/Needle 25G X 1 3 ML Misc Generic drug: SYRINGE-NEEDLE (DISP) 3 ML Use the needle for IM injection once a  month; or as directed.   CALCIUM-MAGNESIUM -ZINC  PO Take by mouth.   cefadroxil 500 MG capsule Commonly known as: DURICEF Take 1 capsule (500 mg total) by mouth 2 (two) times daily for 10 days.   celecoxib 200 MG capsule Commonly known as: CELEBREX Take 200 mg by  mouth 2 (two) times daily.   Cinnamon 500 MG capsule Take 500 mg by mouth 2 (two) times daily.   ciprofloxacin  500 MG tablet Commonly known as: Cipro  Take 1 tablet (500 mg total) by mouth 2 (two) times daily for 10 days.   clobetasol cream 0.05 % Commonly known as: TEMOVATE Apply 1 Application topically 2 (two) times daily.   Contour Next Test test strip Generic drug: glucose blood USE 1 TO 2 TIMES DAILY TO CHECK  BLOOD SUGAR   cyanocobalamin  1000 MCG/ML injection Commonly known as: VITAMIN B12 1 ml injection-once a month.   dapagliflozin  propanediol 5 MG Tabs tablet Commonly known as: Farxiga  Take 1 tablet (5 mg total) by mouth daily.   DULoxetine  60 MG capsule Commonly known as: CYMBALTA  Take 2 capsules (120 mg total) by mouth daily.   finasteride  5 MG tablet Commonly known as: PROSCAR  Take 1 tablet (5 mg total) by mouth daily.   furosemide  20 MG tablet Commonly known as: LASIX  Take 1 tablet (20 mg total) by mouth daily as needed.   gabapentin  400 MG capsule Commonly known as:  NEURONTIN  Take 2 capsules (800 mg total) by mouth 3 (three) times daily.   hydrocortisone  valerate ointment 0.2 % Commonly known as: WEST-CORT APPLY TOPICALLY TO AFFECTED  AREA(S) TWICE DAILY   hydrOXYzine  25 MG capsule Commonly known as: VISTARIL  TAKE 1 CAPSULE(25 MG) BY MOUTH EVERY 8 HOURS AS NEEDED FOR ITCHING   Lantus  SoloStar 100 UNIT/ML Solostar Pen Generic drug: insulin  glargine Inject 24 Units into the skin daily.   levothyroxine 100 MCG tablet Commonly known as: SYNTHROID Take 1 tablet (100 mcg total) by mouth daily at 6 (six) AM. Start taking on: May 26, 2024   losartan  25 MG tablet Commonly known as: COZAAR  TAKE 1 TABLET(25 MG) BY MOUTH DAILY   metFORMIN  1000 MG tablet Commonly known as: GLUCOPHAGE  Take 1 tablet (1,000 mg total) by mouth 2 (two) times daily with a meal.   montelukast 10 MG tablet Commonly known as: SINGULAIR Take 10 mg by mouth daily.   multivitamin with minerals Tabs tablet Take 1 tablet by mouth daily.   Oxycodone  HCl 10 MG Tabs Take 1 tablet by mouth 5 (five) times daily.   pantoprazole 40 MG tablet Commonly known as: PROTONIX Take 1 tablet (40 mg total) by mouth daily.   simvastatin  40 MG tablet Commonly known as: ZOCOR  Take 1 tablet (40 mg total) by mouth at bedtime.   tamsulosin  0.4 MG Caps capsule Commonly known as: FLOMAX  Take 1 capsule (0.4 mg total) by mouth daily.   tirzepatide  5 MG/0.5ML Pen Commonly known as: MOUNJARO  Inject 5 mg into the skin once a week.   triamcinolone  lotion 0.1 % Commonly known as: KENALOG  APPLY TO AFFECTED AREA(S)  TOPICALLY 3 TIMES DAILY               Durable Medical Equipment  (From admission, onward)           Start     Ordered   05/24/24 1336  For home use only DME Other see comment  Once       Comments: ROLLING KNEE SCOOTER  Question:  Length of Need  Answer:  6 Months   05/24/24 1335   05/24/24 1334  DME 3-in-1  (Discharge Planning)  Once        05/24/24 1335    05/24/24 1334  DME Walker rolling  (Discharge Planning)  Once       Question Answer Comment  Walker: With 5 Inch Wheels   Patient needs a walker to treat with the following condition Diabetic foot ulcer (HCC)      05/24/24 1335              Discharge Care Instructions  (From admission, onward)           Start     Ordered   05/25/24 0000  Discharge wound care:       Comments: Per podiatry recommendations   05/25/24 1258             Follow-up Information     Ashley Soulier, DPM. Call on 06/03/2024.   Specialty: Podiatry Contact information: [address] [place] [address] [phone]                 Allergies  Allergen Reactions   Glipizide Other (See Comments)    REACTION: wt. gain, hands tingling   Lisinopril  Other (See Comments)    HYPERKALEMIA   Ramipril Cough   Ibuprofen     UNSPECIFIED REACTION    Bactrim  [Sulfamethoxazole -Trimethoprim ] Nausea And Vomiting and Rash     Subjective: Pt has no complaints or concerns other than pain bothering him today. Wife reports she was told by podiatry team that he would not be dc until tomorrow. Per podiatry note, clearly states he is stable for discharge and they have sent abx for him. Wife states she doesn't have the house ready and wants patient to stay today.    Discharge Exam: BP 134/69 (BP Location: Right Arm)   Pulse 73   Temp 98.2 F (36.8 C) (Oral)   Resp 18   Ht 6' (1.829 m)   Wt 99.8 kg   SpO2 95%   BMI 29.84 kg/m  General: Pt is alert, awake, not in acute distress Cardiovascular: RRR, S1/S2 +, no rubs, no gallops Respiratory: CTA bilaterally, no wheezing, no rhonchi Abdominal: Soft, NT, ND, bowel sounds + Extremities: operative limb was in boot and wrapped, no proximal erythema/edema.      The results of significant diagnostics from this hospitalization (including imaging, microbiology, ancillary and laboratory) are listed below for reference.      Microbiology: Recent Results (from the past 240 hours)  Aerobic/Anaerobic Culture w Gram Stain (surgical/deep wound)     Status: None (Preliminary result)   Collection Time: 05/20/24 12:13 PM   Specimen: Abscess  Result Value Ref Range Status   Specimen Description   Final    ABSCESS Performed at [facility], [address] [place], [place] [address]    Special Requests   Final    ABSCESS OF RIGHT FOOT Performed at [facility], [address] [place], [place] [address]    Gram Stain   Final    NO WBC SEEN NO ORGANISMS SEEN Performed at [facility], [address], [place], [place] [address]    Culture   Final    FEW ENTEROBACTER CLOACAE FEW GROUP B STREP(S.AGALACTIAE)ISOLATED TESTING AGAINST S. AGALACTIAE NOT ROUTINELY PERFORMED DUE TO PREDICTABILITY OF AMP/PEN/VAN SUSCEPTIBILITY. NO ANAEROBES ISOLATED; CULTURE IN PROGRESS FOR 5 DAYS    Report Status PENDING  Incomplete   Organism ID, Bacteria ENTEROBACTER CLOACAE  Final      Susceptibility   Enterobacter cloacae - MIC*    CEFEPIME  <=0.12 SENSITIVE Sensitive     ERTAPENEM <=0.12 SENSITIVE Sensitive     CIPROFLOXACIN  <=0.06 SENSITIVE Sensitive     GENTAMICIN  <=1 SENSITIVE Sensitive     MEROPENEM <=0.25  SENSITIVE Sensitive     TRIMETH /SULFA  <=20 SENSITIVE Sensitive     PIP/TAZO Value in next row Sensitive      <=4 SENSITIVEThis is a modified FDA-approved test that has been validated and its performance characteristics determined by the reporting laboratory.  This laboratory is certified under the Clinical Laboratory Improvement Amendments CLIA as qualified to perform high complexity clinical laboratory testing.    * FEW ENTEROBACTER CLOACAE  Blood Culture (routine x 2)     Status: None (Preliminary result)   Collection Time: 05/21/24  8:36 AM   Specimen: BLOOD RIGHT ARM  Result Value Ref Range Status   Specimen Description BLOOD RIGHT ARM  Final   Special Requests   Final    BOTTLES DRAWN  AEROBIC AND ANAEROBIC Blood Culture results may not be optimal due to an inadequate volume of blood received in culture bottles   Culture   Final    NO GROWTH 4 DAYS Performed at [facility], [address], [place], [place] [address]    Report Status PENDING  Incomplete  Blood Culture (routine x 2)     Status: None (Preliminary result)   Collection Time: 05/21/24  8:36 AM   Specimen: BLOOD RIGHT ARM  Result Value Ref Range Status   Specimen Description BLOOD RIGHT ARM  Final   Special Requests   Final    BOTTLES DRAWN AEROBIC AND ANAEROBIC Blood Culture results may not be optimal due to an inadequate volume of blood received in culture bottles   Culture   Final    NO GROWTH 4 DAYS Performed at [facility], [address] [place], [place] [address]    Report Status PENDING  Incomplete  MRSA Next Gen by PCR, Nasal     Status: None   Collection Time: 05/21/24  6:00 PM   Specimen: Nasal Mucosa; Nasal Swab  Result Value Ref Range Status   MRSA by PCR Next Gen NOT DETECTED NOT DETECTED Final    Comment: (NOTE) The GeneXpert MRSA Assay (FDA approved for NASAL specimens only), is one component of a comprehensive MRSA colonization surveillance program. It is not intended to diagnose MRSA infection nor to guide or monitor treatment for MRSA infections. Test performance is not FDA approved in patients less than 65 years old. Performed at [facility], [address] [place], [place] [address]   Aerobic/Anaerobic Culture w Gram Stain (surgical/deep wound)     Status: None (Preliminary result)   Collection Time: 05/23/24  1:00 PM   Specimen: Path Tissue  Result Value Ref Range Status   Specimen Description   Final    ULCER RIGHT FOOT ULCER Performed at [facility], [address] [place], [place] [address]    Special Requests   Final    NONE Performed at [facility], [address] [place], [place] [address]    Gram Stain    Final    RARE WBC PRESENT, PREDOMINANTLY PMN NO ORGANISMS SEEN    Culture   Final    RARE GRAM NEGATIVE RODS IDENTIFICATION AND SUSCEPTIBILITIES TO FOLLOW Performed at [facility], [address] [place], [place] [address]    Report Status PENDING  Incomplete     Labs: BNP (last 3 results) No results for input(s): BNP in the last 8760 hours. Basic Metabolic Panel: Recent Labs  Lab 05/21/24 0803 05/22/24 0400 05/23/24 0354  NA 126* 129* 130*  K 4.5 4.5 4.3  CL 90* 95* 98  CO2  22 21* 23  GLUCOSE 199* 128* 125*  BUN 27* 20 16  CREATININE 1.27* 1.12 1.02  CALCIUM 8.6* 8.2* 8.1*   Liver Function Tests: Recent Labs  Lab 05/21/24 0803  AST 50*  ALT 56*  ALKPHOS 211*  BILITOT 0.9  PROT 6.8  ALBUMIN 2.7*   No results for input(s): LIPASE, AMYLASE in the last 168 hours. No results for input(s): AMMONIA in the last 168 hours. CBC: Recent Labs  Lab 05/21/24 0803 05/23/24 0354  WBC 14.1* 10.9*  NEUTROABS 11.9*  --   HGB 8.9* 8.3*  HCT 27.9* 25.5*  MCV 87.5 86.1  PLT 427* 419*   Cardiac Enzymes: No results for input(s): CKTOTAL, CKMB, CKMBINDEX, TROPONINI in the last 168 hours. BNP: Invalid input(s): POCBNP CBG: Recent Labs  Lab 05/24/24 1116 05/24/24 1613 05/24/24 2045 05/25/24 0804 05/25/24 1202  GLUCAP 266* 99 139* 99 137*   D-Dimer No results for input(s): DDIMER in the last 72 hours. Hgb A1c No results for input(s): HGBA1C in the last 72 hours. Lipid Profile No results for input(s): CHOL, HDL, LDLCALC, TRIG, CHOLHDL, LDLDIRECT in the last 72 hours. Thyroid function studies No results for input(s): TSH, T4TOTAL, T3FREE, THYROIDAB in the last 72 hours.  Invalid input(s): FREET3 Anemia work up No results for input(s): VITAMINB12, FOLATE, FERRITIN, TIBC, IRON, RETICCTPCT in the last 72 hours. Urinalysis    Component Value Date/Time   COLORURINE YELLOW (A) 05/21/2024 0836    APPEARANCEUR HAZY (A) 05/21/2024 0836   APPEARANCEUR Clear 06/27/2022 1511   LABSPEC 1.016 05/21/2024 0836   PHURINE 5.0 05/21/2024 0836   GLUCOSEU >=500 (A) 05/21/2024 0836   HGBUR NEGATIVE 05/21/2024 0836   BILIRUBINUR NEGATIVE 05/21/2024 0836   BILIRUBINUR Negative 06/27/2022 1511   KETONESUR NEGATIVE 05/21/2024 0836   PROTEINUR NEGATIVE 05/21/2024 0836   NITRITE NEGATIVE 05/21/2024 0836   LEUKOCYTESUR NEGATIVE 05/21/2024 0836   Sepsis Labs Recent Labs  Lab 05/21/24 0803 05/23/24 0354  WBC 14.1* 10.9*   Microbiology Recent Results (from the past 240 hours)  Aerobic/Anaerobic Culture w Gram Stain (surgical/deep wound)     Status: None (Preliminary result)   Collection Time: 05/20/24 12:13 PM   Specimen: Abscess  Result Value Ref Range Status   Specimen Description   Final    ABSCESS Performed at [facility], [address] [place], [place] [address]    Special Requests   Final    ABSCESS OF RIGHT FOOT Performed at [facility], [address] [place], [place] [address]    Gram Stain   Final    NO WBC SEEN NO ORGANISMS SEEN Performed at [facility], [address], [place], [place] [address]    Culture   Final    FEW ENTEROBACTER CLOACAE FEW GROUP B STREP(S.AGALACTIAE)ISOLATED TESTING AGAINST S. AGALACTIAE NOT ROUTINELY PERFORMED DUE TO PREDICTABILITY OF AMP/PEN/VAN SUSCEPTIBILITY. NO ANAEROBES ISOLATED; CULTURE IN PROGRESS FOR 5 DAYS    Report Status PENDING  Incomplete   Organism ID, Bacteria ENTEROBACTER CLOACAE  Final      Susceptibility   Enterobacter cloacae - MIC*    CEFEPIME  <=0.12 SENSITIVE Sensitive     ERTAPENEM <=0.12 SENSITIVE Sensitive     CIPROFLOXACIN  <=0.06 SENSITIVE Sensitive     GENTAMICIN  <=1 SENSITIVE Sensitive     MEROPENEM <=0.25 SENSITIVE Sensitive     TRIMETH /SULFA  <=20 SENSITIVE Sensitive     PIP/TAZO Value in next row Sensitive      <=4 SENSITIVEThis is a modified FDA-approved test that has  been  validated and its performance characteristics determined by the reporting laboratory.  This laboratory is certified under the Clinical Laboratory Improvement Amendments CLIA as qualified to perform high complexity clinical laboratory testing.    * FEW ENTEROBACTER CLOACAE  Blood Culture (routine x 2)     Status: None (Preliminary result)   Collection Time: 05/21/24  8:36 AM   Specimen: BLOOD RIGHT ARM  Result Value Ref Range Status   Specimen Description BLOOD RIGHT ARM  Final   Special Requests   Final    BOTTLES DRAWN AEROBIC AND ANAEROBIC Blood Culture results may not be optimal due to an inadequate volume of blood received in culture bottles   Culture   Final    NO GROWTH 4 DAYS Performed at [facility], [address] [place], [place] [address]    Report Status PENDING  Incomplete  Blood Culture (routine x 2)     Status: None (Preliminary result)   Collection Time: 05/21/24  8:36 AM   Specimen: BLOOD RIGHT ARM  Result Value Ref Range Status   Specimen Description BLOOD RIGHT ARM  Final   Special Requests   Final    BOTTLES DRAWN AEROBIC AND ANAEROBIC Blood Culture results may not be optimal due to an inadequate volume of blood received in culture bottles   Culture   Final    NO GROWTH 4 DAYS Performed at [facility], [address] [place], [place] [address]    Report Status PENDING  Incomplete  MRSA Next Gen by PCR, Nasal     Status: None   Collection Time: 05/21/24  6:00 PM   Specimen: Nasal Mucosa; Nasal Swab  Result Value Ref Range Status   MRSA by PCR Next Gen NOT DETECTED NOT DETECTED Final    Comment: (NOTE) The GeneXpert MRSA Assay (FDA approved for NASAL specimens only), is one component of a comprehensive MRSA colonization surveillance program. It is not intended to diagnose MRSA infection nor to guide or monitor treatment for MRSA infections. Test performance is not FDA approved in patients less than 58 years old. Performed at [facility], [address] [place], [place] [address]   Aerobic/Anaerobic Culture w Gram Stain (surgical/deep wound)     Status: None (Preliminary result)   Collection Time: 05/23/24  1:00 PM   Specimen: Path Tissue  Result Value Ref Range Status   Specimen Description   Final    ULCER RIGHT FOOT ULCER Performed at [facility], [address] [place], [place] [address]    Special Requests   Final    NONE Performed at [facility], [address] [place], [place] [address]    Gram Stain   Final    RARE WBC PRESENT, PREDOMINANTLY PMN NO ORGANISMS SEEN    Culture   Final    RARE GRAM NEGATIVE RODS IDENTIFICATION AND SUSCEPTIBILITIES TO FOLLOW Performed at [facility], [address] [place], [place] [address]    Report Status PENDING  Incomplete   Imaging US  ARTERIAL ABI (SCREENING LOWER EXTREMITY) Result Date: 05/21/2024 EXAM: VASCULAR SCREENING 05/21/2024 11:55:16 AM CLINICAL HISTORY: Diabetic foot ulcer (HCC) [id number]. TECHNIQUE: The ABIs were also acquired. COMPARISON: None available. FINDINGS: ANKLE BRACHIAL INDEX: Right: 1.26 Left: 1.26 ABIs were within normal range. Multiphasic waveforms reported in the distal calf bilaterally. IMPRESSION: 1. No evidence of hemodynamically significant lower extremity arterial occlusive disease at rest. Electronically signed by: Katheleen Faes MD 05/21/2024 12:22 PM EDT RP Workstation: [id number]  MR FOOT RIGHT WO CONTRAST Result Date: 05/21/2024 EXAM: MRI of the right Foot without contrast. 05/21/2024 11:36:46 AM TECHNIQUE: Multiplanar multisequence MRI of the right foot was performed from the posterior subtalar joint through the stump without the administration of intravenous contrast. COMPARISON: Radiographs 05/21/2024. CLINICAL HISTORY: Right foot swelling, diabetic, suspected osteomyelitis, infection, and fever. FINDINGS: LISFRANC JOINT: Visualized Lisfranc ligament is intact. No significant Lisfranc interval  widening or significant periligamentous edema. BONE MARROW: Trace endosteal edema along the distal margins of the 2nd and 3rd, probably reactive and not currently considered specific for osteomyelitis. No acute fracture or aggressive marrow replacing lesion. GREATER AND LESSER MTP JOINTS: No significant joint effusion or osseous erosions. No significant degenerative changes. Normal alignment. SOFT TISSUES: Out distal ulceration along the plantar stump margin as shown on image 18 series 6 likely accounting for the gas density in this vicinity on prior radiography. Dorsal to the 2nd and 3rd metatarsals, a 4.2 x 1.5 x 1.8 cm fluid collection is present and suspicious for abscess. This is probably spontaneously draining to the skin surface through a branching sinus tract anteriorly on images 13 through 17 of series 6 and speckled low signal internally in the fluid collection may represent gas. Appearance compatible with abscess potentially spontaneously draining. Surrounding subcutaneous edema along the dorsum of the foot may well reflect cellulitis. Regional muscular atrophy. TENDONS: Tibialis posterior, flexor digitorum longus, and flexor hallucis longus tenosynovitis noted. IMPRESSION: 1. Dorsal fluid collection in the soft tissues above the remaining 2nd and 3rd metatarsals (4.2 x 1.5 x 1.8 cm) suspicious for abscess, potentially spontaneously draining to the anterior cutaneous surface , with surrounding subcutaneous edema compatible with cellulitis. 2. Distal ulceration along the plantar stump margin, likely accounting for the gas density seen on prior radiography. 3. Trace endosteal edema along the distal margins of the 2nd and 3rd metatarsals, likely reactive and not specific for osteomyelitis. 4. Tibialis posterior, flexor digitorum longus, and flexor hallucis longus tenosynovitis. 5. Regional muscular atrophy. Electronically signed by: Ryan Salvage MD 05/21/2024 11:50 AM EDT RP Workstation: [id number]    DG Foot Complete Right Result Date: 05/21/2024 EXAM: 3 OR MORE VIEW(S) XRAY OF THE RIGHT FOOT 05/21/2024 08:36:00 AM COMPARISON: 12/21/2016 CLINICAL HISTORY: Foot infection. 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 FINDINGS: BONES AND JOINTS: Transmetatarsal amputation of the first through fifth digits. No current bony erosive or destructive findings to specifically indicate osteomyelitis. Plantar calcaneal spur. No joint dislocation. SOFT TISSUES: Soft tissue swelling overlying the amputation stump gas density collection along the plantar distal stump compatible with a large ulceration or localized infection involving the soft tissues, correlate with visual inspection. Atheromatous vascular calcifications. IMPRESSION: 1. Soft tissue swelling and gas along the plantar distal stump, compatible with a large ulceration or localized soft tissue infection. 2. No radiographic evidence of osteomyelitis. 3. Atheromatous vascular calcifications. 4. Plantar calcaneal spur. Electronically signed by: Ryan Salvage MD 05/21/2024 09:04 AM EDT RP Workstation: [id number]      Time coordinating discharge: over 30 minutes  SIGNED:  Avilyn Virtue DO [facility]

## 2024-05-25 NOTE — TOC Progression Note (Signed)
 Transition of Care [facility]) - Progression Note    Patient Details  Name: [name] MRN: [id number] Date of Birth: 26-Mar-1956  Transition of Care [facility]) CM/SW Contact  Victory Jackquline RAMAN, RN Phone Number: 05/25/2024, 11:39 AM  Clinical Narrative:   RNCM received a secure chat from the patient's nurse stating that the patient's wife would like for me to call her. She would like to know price of knee scooter. RNCM called [employer] at [phone] to find out what the price of the knee scooter would be. I spoke to Marsa Clause and he told me that with the patient's insurance there would be a 20% copay for each item and that the person working on the order would be reaching out to the patient directly to get the patient's credit card information for payment prior to delivering the equipment. RNCM called patient's wife Sherron ar [phone] and informed her of the 20% copay and that someone from [employer] would be calling her directly with the exact price. She verbalized understanding. RNCM will continue to follow for discharge planning/care coordination and update as applicable.      Expected Discharge Plan: Home w Home Health Services Barriers to Discharge: Continued Medical Work up               Expected Discharge Plan and Services       Living arrangements for the past 2 months: Single Family Home                 DME Arranged: 3-N-1, Walker rolling, Other see comment (Knee Scooter) DME Agency: [employer] Date DME Agency Contacted: 05/24/24   Representative spoke with at DME Agency: Contacted via email             Social Drivers of Health (SDOH) Interventions SDOH Screenings   Food Insecurity: No Food Insecurity (05/21/2024)  Housing: Low Risk  (05/21/2024)  Transportation Needs: No Transportation Needs (05/21/2024)  Utilities: Not At Risk (05/21/2024)  Alcohol  Screen: Low Risk  (03/04/2024)  Depression (PHQ2-9): Medium Risk (05/02/2024)  Financial Resource  Strain: Low Risk  (05/13/2024)   Received from [facility]  Physical Activity: Inactive (03/03/2024)  Social Connections: Unknown (05/21/2024)  Stress: No Stress Concern Present (03/03/2024)  Tobacco Use: Low Risk  (05/21/2024)  Health Literacy: Adequate Health Literacy (03/04/2024)    Readmission Risk Interventions     No data to display

## 2024-05-25 NOTE — Discharge Instructions (Signed)
 Dressing changes: Can perform 3 times a week.  Remove dressing and cleanse with saline or Betadine.  Applied bulky sterile dressing consisting of 4 x 4's ABD and wrap.  Remain nonweightbearing to right foot.  Elevate while nonambulatory.        CONSTIPATION:  SAFE FOR ALL THE TIME:  Lifestyle measures Hydration: drink plenty of water Physical activity: at least walking daily when able High-fiber foods Bulk forming laxatives - one of the following Psyllium (Konsyl; Metamucil; Perdiem) Methylcellulose (Citrucel) Calcium polycarbophil (FiberCon; Fiber-Lax; Mitrolan) Wheat dextrin (Benefiber)  AS NEEDED FOR 3-5 DAYS AT A TIME OR ALL THE TIME 1-2 TIMES PER WEEK  (CAN TAKE ONE FROM EACH CATEGORY E.G. MIRALAX + SENNA) Hyperosmolar or saline laxatives: one of the following Polyethylene glycol (MiraLAX, GlycoLax) Lactulose Sorbitol Magnesium  hydroxide (Milk of Magnesia)  Magnesium  citrate (Evac-Q-Mag)  Stimulant laxatives: one of the following:  Senna (eg, [address], Ex-Lax, Fletcher's, Castoria, Senokot)  Bisacodyl (eg, Correctol, Doxidan, Dulcolax). Taking stimulant laxatives regularly or in large amounts can cause side effects, including low potassium levels. Thus, you should take these drugs carefully if you must use them regularly.

## 2024-05-25 NOTE — Progress Notes (Signed)
 Daily Progress Note   Subjective  - 2 Days Post-Op  Follow-up right foot debridement.  Doing well.  Objective Vitals:   05/24/24 1557 05/24/24 1938 05/25/24 0342 05/25/24 0747  BP: 112/63 (!) 118/58 122/65 134/69  Pulse: 72 70 66 73  Resp: 17 18 18 18   Temp: 98 F (36.7 C) 98 F (36.7 C) 98 F (36.7 C) 98.2 F (36.8 C)  TempSrc:   Oral Oral  SpO2: 98% 99% (!) 80% 95%  Weight:      Height:        Physical Exam: Wound looks very good today.  There is no erythema.  Minimal drainage.  Mostly bloody.  No purulence.       Laboratory CBC    Component Value Date/Time   WBC 10.9 (H) 05/23/2024 0354   HGB 8.3 (L) 05/23/2024 0354   HGB 11.2 (L) 09/06/2023 0856   HCT 25.5 (L) 05/23/2024 0354   HCT 34.4 (L) 09/06/2023 0856   PLT 419 (H) 05/23/2024 0354   PLT 314 09/06/2023 0856    BMET    Component Value Date/Time   NA 130 (L) 05/23/2024 0354   NA 137 09/06/2023 0856   K 4.3 05/23/2024 0354   CL 98 05/23/2024 0354   CO2 23 05/23/2024 0354   GLUCOSE 125 (H) 05/23/2024 0354   BUN 16 05/23/2024 0354   BUN 13 09/06/2023 0856   CREATININE 1.02 05/23/2024 0354   CREATININE 1.23 01/03/2013 1212   CALCIUM 8.1 (L) 05/23/2024 0354   GFRNONAA >60 05/23/2024 0354   GFRNONAA >60 01/03/2013 1212   GFRAA 95 03/25/2020 0755   GFRAA >60 01/03/2013 1212    Assessment/Planning: Right foot ulcer status postdebridement and Achilles tendon lengthening  Patient will remain nonweightbearing.  The outpatient culture showed group B strep with Enterobacter.  Suspect patient can be discharged on oral antibiotics.  Would recommend Duricef 500 mg twice daily as well as Cipro  500 mg twice daily.  Take for 10 days. I discussed with the patient dressing changes.  A simple dry dressing would suffice.  This can be changed 3 times a week.  I discussed this with the patient as well as his wife yesterday and they felt comfortable to perform this.  May need a few dressings to get started once he is  discharged. I recommended follow-up with me in 1 week. From podiatry standpoint patient is stable for discharge.  Ashley Soulier A  05/25/2024, 8:36 AM

## 2024-05-25 NOTE — Plan of Care (Signed)

## 2024-05-26 ENCOUNTER — Encounter: Payer: Self-pay | Admitting: Podiatry

## 2024-05-26 DIAGNOSIS — E11628 Type 2 diabetes mellitus with other skin complications: Secondary | ICD-10-CM | POA: Diagnosis not present

## 2024-05-26 DIAGNOSIS — L089 Local infection of the skin and subcutaneous tissue, unspecified: Secondary | ICD-10-CM | POA: Diagnosis not present

## 2024-05-26 LAB — CULTURE, BLOOD (ROUTINE X 2)
Culture: NO GROWTH
Culture: NO GROWTH

## 2024-05-26 LAB — GLUCOSE, CAPILLARY
Glucose-Capillary: 98 mg/dL (ref 70–99)
Glucose-Capillary: 106 mg/dL — ABNORMAL HIGH (ref 70–99)
Glucose-Capillary: 146 mg/dL — ABNORMAL HIGH (ref 70–99)

## 2024-05-26 LAB — BASIC METABOLIC PANEL WITH GFR
Anion gap: 9 (ref 5–15)
BUN: 17 mg/dL (ref 8–23)
CO2: 25 mmol/L (ref 22–32)
Calcium: 8.4 mg/dL — ABNORMAL LOW (ref 8.9–10.3)
Chloride: 94 mmol/L — ABNORMAL LOW (ref 98–111)
Creatinine, Ser: 1.14 mg/dL (ref 0.61–1.24)
GFR, Estimated: >60 mL/min (ref >60)
Glucose, Bld: 160 mg/dL — ABNORMAL HIGH (ref 70–99)
Potassium: 4.4 mmol/L (ref 3.5–5.1)
Sodium: 128 mmol/L — ABNORMAL LOW (ref 135–145)

## 2024-05-26 MED ORDER — OXYCODONE HCL 5 MG PO TABS
5.0000 mg | ORAL_TABLET | ORAL | Status: DC | PRN
  Administered 2024-05-27: 5 mg via ORAL
  Filled 2024-05-26: qty 1

## 2024-05-26 MED ORDER — SENNOSIDES-DOCUSATE SODIUM 8.6-50 MG PO TABS
2.0000 | ORAL_TABLET | Freq: Once | ORAL | Status: AC
  Administered 2024-05-26: 2 via ORAL
  Filled 2024-05-26: qty 2

## 2024-05-26 MED ORDER — ACETAMINOPHEN 325 MG PO TABS: 975.0000 mg | ORAL_TABLET | Freq: Four times a day (QID) | ORAL | Status: DC | PRN

## 2024-05-26 MED ORDER — POLYETHYLENE GLYCOL 3350 17 G PO PACK
17.0000 g | PACK | Freq: Once | ORAL | Status: AC
  Administered 2024-05-26: 17 g via ORAL

## 2024-05-26 MED ORDER — FLEET ENEMA RE ENEM
1.0000 | ENEMA | Freq: Once | RECTAL | Status: DC
Start: 2024-05-26 — End: 2024-05-27

## 2024-05-26 MED ORDER — KETOROLAC TROMETHAMINE 15 MG/ML IJ SOLN: 15.0000 mg | Freq: Four times a day (QID) | INTRAMUSCULAR | Status: DC | PRN

## 2024-05-26 NOTE — Progress Notes (Signed)
 Mobility Specialist - Progress Note   05/26/24 1456  Mobility  Activity Ambulated with assistance;Stood at bedside  Level of Assistance Standby assist, set-up cues, supervision of patient - no hands on  Assistive Device Front wheel walker  Distance Ambulated (ft) 12 ft  RLE Weight Bearing Per Provider Order NWB  Activity Response Tolerated well  Mobility visit 1 Mobility  Mobility Specialist Start Time (ACUTE ONLY) 1446  Mobility Specialist Stop Time (ACUTE ONLY) 1456  Mobility Specialist Time Calculation (min) (ACUTE ONLY) 10 min   America Silvan Mobility Specialist 05/26/24 3:19 PM

## 2024-05-26 NOTE — Progress Notes (Signed)
 PT Cancellation Note  Patient Details Name: [name] MRN: [id number] DOB: 1956-06-04   Cancelled Treatment:    Reason Eval/Treat Not Completed: Other (comment)  Pt offered and encouraged x 2 today.  Continues to c/o malaise.  On second attempt, pt stated he is going home after IV meds.  Does not want any more PT interventions before discharge.  Will continue as appropriate if pt does not discharge today.   Lauraine Gills 05/26/2024, 11:21 AM

## 2024-05-26 NOTE — Discharge Summary (Signed)
 Physician Discharge Summary   Patient: [name] MRN: [id number]  DOB: 17-May-1956   Admit:     Date of Admission: 05/21/2024 Admitted from: home   Discharge: Date of discharge: 05/25/24 DC order placed wife refused discharge, 05/26/24 left hospital  Disposition: Home health Condition at discharge: good  CODE STATUS: FULL CODE     Discharge Physician: Laneta Blunt, DO [facility]     PCP: Jimmy Charlie FERNS, MD  Recommendations for Outpatient Follow-up:  Follow up with PCP Jimmy Charlie FERNS, MD in 1-2 weeks Follow up with Dr Ashley podiatry within 1 week    Discharge Instructions     Diet - low sodium heart healthy   Complete by: As directed    Discharge wound care:   Complete by: As directed    Per podiatry recommendations   Increase activity slowly   Complete by: As directed          Discharge Diagnoses: Principal Problem:   Diabetic foot infection (HCC) Active Problems:   Diabetic foot ulcer with osteomyelitis (HCC)   PAD (peripheral artery disease)        Hospital course / significant events:   [name] is a 68 y.o. male with medical history significant of IDDM, diabetic foot infection status post right transmetatarsal amputation, HTN, HLD, PVD, alcohol  abuse, psoriasis, Schatzki's ring, diabetic neuropathy, sent from podiatrist office for evaluation of worsening of right foot infection. Patient has a chronic right foot ulcer developed 2 to 3 months ago, for which he has been followed by outpatient wound care and podiatry. This time he started noticed increasing rash and swelling of the right foot, and started to have low-grade fever last 2 days with Tmax 101 last night. He went to see podiatry 10/29 who sent patient to ED   10/29: MRI showed right foot abscess and tenosynovitis but no signs of osteomyelitis - admitted to hospitalist. Vascular consult - would proceed w/ transmetatarsal amputation but low threshold for angio +/-  BKA next week if concern for severe small vessel disease / nonhealing  10/30: stable, await surgery tomorrow, remain on IV abx 10/31: underwent surgery as below w/ I&D and bone excision, implantation abx beads. Pend cultures/pathology but podiatry will follow in office  11/01: podiatry Continue to monitor cultures. Suspect in 1 to 2 days patient will be stable for discharge. PT/OT following. 11/02: per podiatry ok for dicsharge. NWB. OK for Duricef 500 mg twice daily + Cipro  500 mg twice daily. x10 days. Wife states she was told could dc tomorrow so does not have equipment or house ready yet. Pt is medically stable, dc orders placed, abx and synthroid rx in place.  11/03: TOC confirms all DME and HH, pt got another dose IV abx which was not medically necessary but was reasonable given he was still here. Discharge order remains and d/w staff confirming he is stable for discharge      Consultants:  Podiatry Vascular surgery   Procedures/Surgeries: 05/23/24 1. Percutaneous tendo Achilles lengthening right lower extremity 2. I&D abscess distal transmetatarsal amputation site 3. Excision of bone distal second metatarsal 4. Excision of bone distal third metatarsal 5. Excision of bone distal fourth metatarsal 6. Intraoperative fluoroscopy without assistance of radiologist 7. Implantation antibiotic impregnated beads       ASSESSMENT & PLAN:   Right foot diabetic ulcer infection - sepsis RULED OUT Right foot abscess formation Right foot infective tenosynovitis PAD with right lower extremity transmetatarsal amputation from October  2024.  S/p I&D see above Duricef 500 mg twice daily + Cipro  500 mg twice daily. x10 days.   Patient will remain nonweightbearing.  dressing changes - simple dry dressing would suffice.  This can be changed 3 times a week.  Podiatry discussed this with the patient as well as his wife yesterday and they felt comfortable to perform this. Podiatry recommended follow-up  with Dr Ashley in 1 week. From medical and podiatry standpoint patient is stable for discharge but wife is declining to leave today stating she was told he could go tomorrow so she doesn't have the house ready yet for him / DME not all in place. Made TOC aware.   PVD/PAD Chronic claudication HLD Arterial Doppler no concerns Vascular consult - more concern for small vessel disease, can get angio if surgical intervention gives any concern for insufficient blood flow, and would consider for BKA - podiatry to follow outpatient and involve vascular as needed  Continue statin Not on aspirin due to history of NSAIDs allergy    IDDM with hyperglycemia Hx nonadherence to recommended therapy  Resume home meds    Diabetic neuropathy Gabapentin    Chronic hyponatremia Euvolemic Clinically suspect hyponatremia secondary to alcohol  abuse / hypothyroid  Likely w/ hypothyroid - treat as below  EtOH abstinence encouraged  Monitor BMP outpatient    Chronic anemia H&H stable Monitor CBC outpatient   Essential HTN Amlodipine   Lasix   Losartan    Hypothyroid TSH >10 (Not probably also contributing to hyponatremia)  Add on T4 - WNL TSH >10 and sytmpoms over past few mos (wife notes more sluggish, weight gain, constipation) will start synthroid  Outpatient follow up   Anx/Dep Cymbalta    BPH Finasteride   Tamsulosin    GERD PPI   Class 1 obesity based on BMI: Body mass index is 29.84 kg/m.[name] Significantly low or high BMI is associated with higher medical risk.  Underweight - under 18  overweight - 25 to 29 obese - 30 or more Class 1 obesity: BMI of 30.0 to 34 Class 2 obesity: BMI of 35.0 to 39 Class 3 obesity: BMI of 40.0 to 49 Super Morbid Obesity: BMI 50-59 Super-super Morbid Obesity: BMI 60+ Healthy nutrition and physical activity advised as adjunct to other disease management and risk reduction treatments            Discharge Instructions  Allergies as of 05/26/2024        Reactions   Glipizide Other (See Comments)   REACTION: wt. gain, hands tingling   Lisinopril  Other (See Comments)   HYPERKALEMIA   Ramipril Cough   Ibuprofen    UNSPECIFIED REACTION    Bactrim  [sulfamethoxazole -trimethoprim ] Nausea And Vomiting, Rash        Medication List     STOP taking these medications    omeprazole  20 MG capsule Commonly known as: PRILOSEC       TAKE these medications    Alcohol  Prep 70 % Pads USE 1 PREP PAD TWICE DAILY   Alpha-Lipoic Acid 600 MG Tabs Take by mouth. 3 in am and 1 in pm   amLODipine  5 MG tablet Commonly known as: NORVASC  TAKE 1 TABLET(5 MG) BY MOUTH DAILY   B-D UF III MINI PEN NEEDLES 31G X 5 MM Misc Generic drug: Insulin  Pen Needle USE AS DIRECTED DAILY   BD SafetyGlide Syringe/Needle 25G X 1 3 ML Misc Generic drug: SYRINGE-NEEDLE (DISP) 3 ML Use the needle for IM injection once a  month; or as directed.   CALCIUM-MAGNESIUM -ZINC  PO Take  by mouth.   cefadroxil 500 MG capsule Commonly known as: DURICEF Take 1 capsule (500 mg total) by mouth 2 (two) times daily for 10 days.   celecoxib 200 MG capsule Commonly known as: CELEBREX Take 200 mg by mouth 2 (two) times daily.   Cinnamon 500 MG capsule Take 500 mg by mouth 2 (two) times daily.   ciprofloxacin  500 MG tablet Commonly known as: Cipro  Take 1 tablet (500 mg total) by mouth 2 (two) times daily for 10 days.   clobetasol cream 0.05 % Commonly known as: TEMOVATE Apply 1 Application topically 2 (two) times daily.   Contour Next Test test strip Generic drug: glucose blood USE 1 TO 2 TIMES DAILY TO CHECK  BLOOD SUGAR   cyanocobalamin  1000 MCG/ML injection Commonly known as: VITAMIN B12 1 ml injection-once a month.   dapagliflozin  propanediol 5 MG Tabs tablet Commonly known as: Farxiga  Take 1 tablet (5 mg total) by mouth daily.   DULoxetine  60 MG capsule Commonly known as: CYMBALTA  Take 2 capsules (120 mg total) by mouth daily.   finasteride  5 MG  tablet Commonly known as: PROSCAR  Take 1 tablet (5 mg total) by mouth daily.   furosemide  20 MG tablet Commonly known as: LASIX  Take 1 tablet (20 mg total) by mouth daily as needed.   gabapentin  400 MG capsule Commonly known as: NEURONTIN  Take 2 capsules (800 mg total) by mouth 3 (three) times daily.   hydrocortisone  valerate ointment 0.2 % Commonly known as: WEST-CORT APPLY TOPICALLY TO AFFECTED  AREA(S) TWICE DAILY   hydrOXYzine  25 MG capsule Commonly known as: VISTARIL  TAKE 1 CAPSULE(25 MG) BY MOUTH EVERY 8 HOURS AS NEEDED FOR ITCHING   Lantus  SoloStar 100 UNIT/ML Solostar Pen Generic drug: insulin  glargine Inject 24 Units into the skin daily.   levothyroxine 100 MCG tablet Commonly known as: SYNTHROID Take 1 tablet (100 mcg total) by mouth daily at 6 (six) AM.   losartan  25 MG tablet Commonly known as: COZAAR  TAKE 1 TABLET(25 MG) BY MOUTH DAILY   metFORMIN  1000 MG tablet Commonly known as: GLUCOPHAGE  Take 1 tablet (1,000 mg total) by mouth 2 (two) times daily with a meal.   montelukast 10 MG tablet Commonly known as: SINGULAIR Take 10 mg by mouth daily.   multivitamin with minerals Tabs tablet Take 1 tablet by mouth daily.   Oxycodone  HCl 10 MG Tabs Take 1 tablet by mouth 5 (five) times daily.   pantoprazole 40 MG tablet Commonly known as: PROTONIX Take 1 tablet (40 mg total) by mouth daily.   simvastatin  40 MG tablet Commonly known as: ZOCOR  Take 1 tablet (40 mg total) by mouth at bedtime.   tamsulosin  0.4 MG Caps capsule Commonly known as: FLOMAX  Take 1 capsule (0.4 mg total) by mouth daily.   tirzepatide  5 MG/0.5ML Pen Commonly known as: MOUNJARO  Inject 5 mg into the skin once a week.   triamcinolone  lotion 0.1 % Commonly known as: KENALOG  APPLY TO AFFECTED AREA(S)  TOPICALLY 3 TIMES DAILY               Durable Medical Equipment  (From admission, onward)           Start     Ordered   05/24/24 1336  For home use only DME Other  see comment  Once       Comments: ROLLING KNEE SCOOTER  Question:  Length of Need  Answer:  6 Months   05/24/24 1335   05/24/24 1334  DME 3-in-1  (Discharge  Planning)  Once        05/24/24 1335   05/24/24 1334  DME Walker rolling  (Discharge Planning)  Once       Question Answer Comment  Walker: With 5 Inch Wheels   Patient needs a walker to treat with the following condition Diabetic foot ulcer (HCC)      05/24/24 1335              Discharge Care Instructions  (From admission, onward)           Start     Ordered   05/25/24 0000  Discharge wound care:       Comments: Per podiatry recommendations   05/25/24 1258             Contact information for follow-up providers     Ashley Soulier, DPM. Call on 06/03/2024.   Specialty: Podiatry Contact information: [address] [place] [address] [phone]              Contact information for after-discharge care     Home Medical Care     [facility] [place] .   Service: Home Health Services Contact information: [phone] [address] [place]  [address] [phone]                     Allergies  Allergen Reactions   Glipizide Other (See Comments)    REACTION: wt. gain, hands tingling   Lisinopril  Other (See Comments)    HYPERKALEMIA   Ramipril Cough   Ibuprofen     UNSPECIFIED REACTION    Bactrim  [Sulfamethoxazole -Trimethoprim ] Nausea And Vomiting and Rash     Subjective: Pt has no complaints or concerns this morning.    Discharge Exam: BP 130/70 (BP Location: Right Arm)   Pulse 74   Temp 98.4 F (36.9 C) (Oral)   Resp 16   Ht 6' (1.829 m)   Wt 99.8 kg   SpO2 97%   BMI 29.84 kg/m  General: Pt is alert, awake, not in acute distress Cardiovascular: RRR, S1/S2 +, no rubs, no gallops Respiratory: CTA bilaterally, no wheezing, no rhonchi Abdominal: Soft, NT, ND, bowel sounds + Extremities: operative limb was in boot and wrapped, no proximal  erythema/edema.      The results of significant diagnostics from this hospitalization (including imaging, microbiology, ancillary and laboratory) are listed below for reference.     Microbiology: Recent Results (from the past 240 hours)  Aerobic/Anaerobic Culture w Gram Stain (surgical/deep wound)     Status: None   Collection Time: 05/20/24 12:13 PM   Specimen: Abscess  Result Value Ref Range Status   Specimen Description   Final    ABSCESS Performed at [facility], [address], [place], [place] [address]    Special Requests   Final    ABSCESS OF RIGHT FOOT Performed at [facility], [address] [place], [place] [address]    Gram Stain NO WBC SEEN NO ORGANISMS SEEN   Final   Culture   Final    FEW ENTEROBACTER CLOACAE FEW GROUP B STREP(S.AGALACTIAE)ISOLATED TESTING AGAINST S. AGALACTIAE NOT ROUTINELY PERFORMED DUE TO PREDICTABILITY OF AMP/PEN/VAN SUSCEPTIBILITY. NO ANAEROBES ISOLATED Performed at [facility], [address] [place], [place] [address]    Report Status 05/25/2024 FINAL  Final   Organism ID, Bacteria ENTEROBACTER CLOACAE  Final      Susceptibility   Enterobacter cloacae - MIC*    CEFEPIME  <=0.12 SENSITIVE Sensitive  ERTAPENEM <=0.12 SENSITIVE Sensitive     CIPROFLOXACIN  <=0.06 SENSITIVE Sensitive     GENTAMICIN  <=1 SENSITIVE Sensitive     MEROPENEM <=0.25 SENSITIVE Sensitive     TRIMETH /SULFA  <=20 SENSITIVE Sensitive     PIP/TAZO Value in next row Sensitive      <=4 SENSITIVEThis is a modified FDA-approved test that has been validated and its performance characteristics determined by the reporting laboratory.  This laboratory is certified under the Clinical Laboratory Improvement Amendments CLIA as qualified to perform high complexity clinical laboratory testing.    * FEW ENTEROBACTER CLOACAE  Blood Culture (routine x 2)     Status: None   Collection Time: 05/21/24  8:36 AM   Specimen: BLOOD RIGHT ARM  Result Value  Ref Range Status   Specimen Description BLOOD RIGHT ARM  Final   Special Requests   Final    BOTTLES DRAWN AEROBIC AND ANAEROBIC Blood Culture results may not be optimal due to an inadequate volume of blood received in culture bottles   Culture   Final    NO GROWTH 5 DAYS Performed at [facility], [address] [place], [place] [address]    Report Status 05/26/2024 FINAL  Final  Blood Culture (routine x 2)     Status: None   Collection Time: 05/21/24  8:36 AM   Specimen: BLOOD RIGHT ARM  Result Value Ref Range Status   Specimen Description BLOOD RIGHT ARM  Final   Special Requests   Final    BOTTLES DRAWN AEROBIC AND ANAEROBIC Blood Culture results may not be optimal due to an inadequate volume of blood received in culture bottles   Culture   Final    NO GROWTH 5 DAYS Performed at [facility], [address], [place], [place] [address]    Report Status 05/26/2024 FINAL  Final  MRSA Next Gen by PCR, Nasal     Status: None   Collection Time: 05/21/24  6:00 PM   Specimen: Nasal Mucosa; Nasal Swab  Result Value Ref Range Status   MRSA by PCR Next Gen NOT DETECTED NOT DETECTED Final    Comment: (NOTE) The GeneXpert MRSA Assay (FDA approved for NASAL specimens only), is one component of a comprehensive MRSA colonization surveillance program. It is not intended to diagnose MRSA infection nor to guide or monitor treatment for MRSA infections. Test performance is not FDA approved in patients less than 39 years old. Performed at [facility], [address] [place], [place] [address]   Aerobic/Anaerobic Culture w Gram Stain (surgical/deep wound)     Status: None (Preliminary result)   Collection Time: 05/23/24  1:00 PM   Specimen: Path Tissue  Result Value Ref Range Status   Specimen Description   Final    ULCER RIGHT FOOT ULCER Performed at [facility], [address], [place], [place] [address]    Special Requests   Final     NONE Performed at [facility], [address] [place], [place] [address]    Gram Stain   Final    RARE WBC PRESENT, PREDOMINANTLY PMN NO ORGANISMS SEEN Performed at [facility], [address], [place], [place] [address]    Culture   Final    RARE ENTEROBACTER CLOACAE RARE ESCHERICHIA COLI NO ANAEROBES ISOLATED; CULTURE IN PROGRESS FOR 5 DAYS    Report Status PENDING  Incomplete   Organism ID, Bacteria ENTEROBACTER CLOACAE  Final   Organism ID, Bacteria ESCHERICHIA COLI  Final  Susceptibility   Enterobacter cloacae - MIC*    CEFEPIME  <=0.12 SENSITIVE Sensitive     ERTAPENEM <=0.12 SENSITIVE Sensitive     CIPROFLOXACIN  <=0.06 SENSITIVE Sensitive     GENTAMICIN  <=1 SENSITIVE Sensitive     MEROPENEM <=0.25 SENSITIVE Sensitive     TRIMETH /SULFA  <=20 SENSITIVE Sensitive     PIP/TAZO Value in next row Sensitive      <=4 SENSITIVEThis is a modified FDA-approved test that has been validated and its performance characteristics determined by the reporting laboratory.  This laboratory is certified under the Clinical Laboratory Improvement Amendments CLIA as qualified to perform high complexity clinical laboratory testing.    * RARE ENTEROBACTER CLOACAE   Escherichia coli - MIC*    AMPICILLIN Value in next row Resistant      <=4 SENSITIVEThis is a modified FDA-approved test that has been validated and its performance characteristics determined by the reporting laboratory.  This laboratory is certified under the Clinical Laboratory Improvement Amendments CLIA as qualified to perform high complexity clinical laboratory testing.    CEFAZOLIN  (NON-URINE) Value in next row Sensitive      <=4 SENSITIVEThis is a modified FDA-approved test that has been validated and its performance characteristics determined by the reporting laboratory.  This laboratory is certified under the Clinical Laboratory Improvement Amendments CLIA as qualified to perform high complexity clinical laboratory  testing.    CEFEPIME  Value in next row Sensitive      <=4 SENSITIVEThis is a modified FDA-approved test that has been validated and its performance characteristics determined by the reporting laboratory.  This laboratory is certified under the Clinical Laboratory Improvement Amendments CLIA as qualified to perform high complexity clinical laboratory testing.    ERTAPENEM Value in next row Sensitive      <=4 SENSITIVEThis is a modified FDA-approved test that has been validated and its performance characteristics determined by the reporting laboratory.  This laboratory is certified under the Clinical Laboratory Improvement Amendments CLIA as qualified to perform high complexity clinical laboratory testing.    CEFTRIAXONE  Value in next row Sensitive      <=4 SENSITIVEThis is a modified FDA-approved test that has been validated and its performance characteristics determined by the reporting laboratory.  This laboratory is certified under the Clinical Laboratory Improvement Amendments CLIA as qualified to perform high complexity clinical laboratory testing.    CIPROFLOXACIN  Value in next row Sensitive      <=4 SENSITIVEThis is a modified FDA-approved test that has been validated and its performance characteristics determined by the reporting laboratory.  This laboratory is certified under the Clinical Laboratory Improvement Amendments CLIA as qualified to perform high complexity clinical laboratory testing.    GENTAMICIN  Value in next row Sensitive      <=4 SENSITIVEThis is a modified FDA-approved test that has been validated and its performance characteristics determined by the reporting laboratory.  This laboratory is certified under the Clinical Laboratory Improvement Amendments CLIA as qualified to perform high complexity clinical laboratory testing.    MEROPENEM Value in next row Sensitive      <=4 SENSITIVEThis is a modified FDA-approved test that has been validated and its performance characteristics  determined by the reporting laboratory.  This laboratory is certified under the Clinical Laboratory Improvement Amendments CLIA as qualified to perform high complexity clinical laboratory testing.    TRIMETH /SULFA  Value in next row Sensitive      <=4 SENSITIVEThis is a modified FDA-approved test that has been validated and its performance characteristics determined by the reporting laboratory.  This laboratory is certified under the Clinical Laboratory Improvement Amendments CLIA as qualified to perform high complexity clinical laboratory testing.    AMPICILLIN/SULBACTAM Value in next row Sensitive      <=4 SENSITIVEThis is a modified FDA-approved test that has been validated and its performance characteristics determined by the reporting laboratory.  This laboratory is certified under the Clinical Laboratory Improvement Amendments CLIA as qualified to perform high complexity clinical laboratory testing.    PIP/TAZO Value in next row Sensitive      <=4 SENSITIVEThis is a modified FDA-approved test that has been validated and its performance characteristics determined by the reporting laboratory.  This laboratory is certified under the Clinical Laboratory Improvement Amendments CLIA as qualified to perform high complexity clinical laboratory testing.    * RARE ESCHERICHIA COLI     Labs: BNP (last 3 results) No results for input(s): BNP in the last 8760 hours. Basic Metabolic Panel: Recent Labs  Lab 05/21/24 0803 05/22/24 0400 05/23/24 0354  NA 126* 129* 130*  K 4.5 4.5 4.3  CL 90* 95* 98  CO2 22 21* 23  GLUCOSE 199* 128* 125*  BUN 27* 20 16  CREATININE 1.27* 1.12 1.02  CALCIUM 8.6* 8.2* 8.1*   Liver Function Tests: Recent Labs  Lab 05/21/24 0803  AST 50*  ALT 56*  ALKPHOS 211*  BILITOT 0.9  PROT 6.8  ALBUMIN 2.7*   No results for input(s): LIPASE, AMYLASE in the last 168 hours. No results for input(s): AMMONIA in the last 168 hours. CBC: Recent Labs  Lab  05/21/24 0803 05/23/24 0354  WBC 14.1* 10.9*  NEUTROABS 11.9*  --   HGB 8.9* 8.3*  HCT 27.9* 25.5*  MCV 87.5 86.1  PLT 427* 419*   Cardiac Enzymes: No results for input(s): CKTOTAL, CKMB, CKMBINDEX, TROPONINI in the last 168 hours. BNP: Invalid input(s): POCBNP CBG: Recent Labs  Lab 05/25/24 1202 05/25/24 1717 05/25/24 2110 05/26/24 0750 05/26/24 1210  GLUCAP 137* 119* 157* 98 106*   D-Dimer No results for input(s): DDIMER in the last 72 hours. Hgb A1c No results for input(s): HGBA1C in the last 72 hours. Lipid Profile No results for input(s): CHOL, HDL, LDLCALC, TRIG, CHOLHDL, LDLDIRECT in the last 72 hours. Thyroid function studies No results for input(s): TSH, T4TOTAL, T3FREE, THYROIDAB in the last 72 hours.  Invalid input(s): FREET3 Anemia work up No results for input(s): VITAMINB12, FOLATE, FERRITIN, TIBC, IRON, RETICCTPCT in the last 72 hours. Urinalysis    Component Value Date/Time   COLORURINE YELLOW (A) 05/21/2024 0836   APPEARANCEUR HAZY (A) 05/21/2024 0836   APPEARANCEUR Clear 06/27/2022 1511   LABSPEC 1.016 05/21/2024 0836   PHURINE 5.0 05/21/2024 0836   GLUCOSEU >=500 (A) 05/21/2024 0836   HGBUR NEGATIVE 05/21/2024 0836   BILIRUBINUR NEGATIVE 05/21/2024 0836   BILIRUBINUR Negative 06/27/2022 1511   KETONESUR NEGATIVE 05/21/2024 0836   PROTEINUR NEGATIVE 05/21/2024 0836   NITRITE NEGATIVE 05/21/2024 0836   LEUKOCYTESUR NEGATIVE 05/21/2024 0836   Sepsis Labs Recent Labs  Lab 05/21/24 0803 05/23/24 0354  WBC 14.1* 10.9*   Microbiology Recent Results (from the past 240 hours)  Aerobic/Anaerobic Culture w Gram Stain (surgical/deep wound)     Status: None   Collection Time: 05/20/24 12:13 PM   Specimen: Abscess  Result Value Ref Range Status   Specimen Description   Final    ABSCESS Performed at [facility], [address] [place], [place] [address]    Special Requests   Final  ABSCESS OF RIGHT FOOT Performed at [facility], [address] [place], [place] [address]    Gram Stain NO WBC SEEN NO ORGANISMS SEEN   Final   Culture   Final    FEW ENTEROBACTER CLOACAE FEW GROUP B STREP(S.AGALACTIAE)ISOLATED TESTING AGAINST S. AGALACTIAE NOT ROUTINELY PERFORMED DUE TO PREDICTABILITY OF AMP/PEN/VAN SUSCEPTIBILITY. NO ANAEROBES ISOLATED Performed at [facility], [address], [place], [place] [address]    Report Status 05/25/2024 FINAL  Final   Organism ID, Bacteria ENTEROBACTER CLOACAE  Final      Susceptibility   Enterobacter cloacae - MIC*    CEFEPIME  <=0.12 SENSITIVE Sensitive     ERTAPENEM <=0.12 SENSITIVE Sensitive     CIPROFLOXACIN  <=0.06 SENSITIVE Sensitive     GENTAMICIN  <=1 SENSITIVE Sensitive     MEROPENEM <=0.25 SENSITIVE Sensitive     TRIMETH /SULFA  <=20 SENSITIVE Sensitive     PIP/TAZO Value in next row Sensitive      <=4 SENSITIVEThis is a modified FDA-approved test that has been validated and its performance characteristics determined by the reporting laboratory.  This laboratory is certified under the Clinical Laboratory Improvement Amendments CLIA as qualified to perform high complexity clinical laboratory testing.    * FEW ENTEROBACTER CLOACAE  Blood Culture (routine x 2)     Status: None   Collection Time: 05/21/24  8:36 AM   Specimen: BLOOD RIGHT ARM  Result Value Ref Range Status   Specimen Description BLOOD RIGHT ARM  Final   Special Requests   Final    BOTTLES DRAWN AEROBIC AND ANAEROBIC Blood Culture results may not be optimal due to an inadequate volume of blood received in culture bottles   Culture   Final    NO GROWTH 5 DAYS Performed at [facility], [address] [place], [place] [address]    Report Status 05/26/2024 FINAL  Final  Blood Culture (routine x 2)     Status: None   Collection Time: 05/21/24  8:36 AM   Specimen: BLOOD RIGHT ARM  Result Value Ref Range Status   Specimen Description  BLOOD RIGHT ARM  Final   Special Requests   Final    BOTTLES DRAWN AEROBIC AND ANAEROBIC Blood Culture results may not be optimal due to an inadequate volume of blood received in culture bottles   Culture   Final    NO GROWTH 5 DAYS Performed at [facility], [address] [place], [place] [address]    Report Status 05/26/2024 FINAL  Final  MRSA Next Gen by PCR, Nasal     Status: None   Collection Time: 05/21/24  6:00 PM   Specimen: Nasal Mucosa; Nasal Swab  Result Value Ref Range Status   MRSA by PCR Next Gen NOT DETECTED NOT DETECTED Final    Comment: (NOTE) The GeneXpert MRSA Assay (FDA approved for NASAL specimens only), is one component of a comprehensive MRSA colonization surveillance program. It is not intended to diagnose MRSA infection nor to guide or monitor treatment for MRSA infections. Test performance is not FDA approved in patients less than 79 years old. Performed at [facility], [address] [place], [place] [address]   Aerobic/Anaerobic Culture w Gram Stain (surgical/deep wound)     Status: None (Preliminary result)   Collection Time: 05/23/24  1:00 PM   Specimen: Path Tissue  Result Value Ref Range Status   Specimen Description   Final    ULCER RIGHT FOOT ULCER Performed at [facility],  [address] [place], [place] [address]    Special Requests   Final    NONE Performed at [facility], [address] [place], [place] [address]    Gram Stain   Final    RARE WBC PRESENT, PREDOMINANTLY PMN NO ORGANISMS SEEN Performed at [facility], [address] [place], [place] [address]    Culture   Final    RARE ENTEROBACTER CLOACAE RARE ESCHERICHIA COLI NO ANAEROBES ISOLATED; CULTURE IN PROGRESS FOR 5 DAYS    Report Status PENDING  Incomplete   Organism ID, Bacteria ENTEROBACTER CLOACAE  Final   Organism ID, Bacteria ESCHERICHIA COLI  Final      Susceptibility   Enterobacter cloacae - MIC*    CEFEPIME   <=0.12 SENSITIVE Sensitive     ERTAPENEM <=0.12 SENSITIVE Sensitive     CIPROFLOXACIN  <=0.06 SENSITIVE Sensitive     GENTAMICIN  <=1 SENSITIVE Sensitive     MEROPENEM <=0.25 SENSITIVE Sensitive     TRIMETH /SULFA  <=20 SENSITIVE Sensitive     PIP/TAZO Value in next row Sensitive      <=4 SENSITIVEThis is a modified FDA-approved test that has been validated and its performance characteristics determined by the reporting laboratory.  This laboratory is certified under the Clinical Laboratory Improvement Amendments CLIA as qualified to perform high complexity clinical laboratory testing.    * RARE ENTEROBACTER CLOACAE   Escherichia coli - MIC*    AMPICILLIN Value in next row Resistant      <=4 SENSITIVEThis is a modified FDA-approved test that has been validated and its performance characteristics determined by the reporting laboratory.  This laboratory is certified under the Clinical Laboratory Improvement Amendments CLIA as qualified to perform high complexity clinical laboratory testing.    CEFAZOLIN  (NON-URINE) Value in next row Sensitive      <=4 SENSITIVEThis is a modified FDA-approved test that has been validated and its performance characteristics determined by the reporting laboratory.  This laboratory is certified under the Clinical Laboratory Improvement Amendments CLIA as qualified to perform high complexity clinical laboratory testing.    CEFEPIME  Value in next row Sensitive      <=4 SENSITIVEThis is a modified FDA-approved test that has been validated and its performance characteristics determined by the reporting laboratory.  This laboratory is certified under the Clinical Laboratory Improvement Amendments CLIA as qualified to perform high complexity clinical laboratory testing.    ERTAPENEM Value in next row Sensitive      <=4 SENSITIVEThis is a modified FDA-approved test that has been validated and its performance characteristics determined by the reporting laboratory.  This laboratory is  certified under the Clinical Laboratory Improvement Amendments CLIA as qualified to perform high complexity clinical laboratory testing.    CEFTRIAXONE  Value in next row Sensitive      <=4 SENSITIVEThis is a modified FDA-approved test that has been validated and its performance characteristics determined by the reporting laboratory.  This laboratory is certified under the Clinical Laboratory Improvement Amendments CLIA as qualified to perform high complexity clinical laboratory testing.    CIPROFLOXACIN  Value in next row Sensitive      <=4 SENSITIVEThis is a modified FDA-approved test that has been validated and its performance characteristics determined by the reporting laboratory.  This laboratory is certified under the Clinical Laboratory Improvement Amendments CLIA as qualified to perform high complexity clinical laboratory testing.    GENTAMICIN  Value in next row Sensitive      <=4 SENSITIVEThis is a modified FDA-approved test that has been validated  and its performance characteristics determined by the reporting laboratory.  This laboratory is certified under the Clinical Laboratory Improvement Amendments CLIA as qualified to perform high complexity clinical laboratory testing.    MEROPENEM Value in next row Sensitive      <=4 SENSITIVEThis is a modified FDA-approved test that has been validated and its performance characteristics determined by the reporting laboratory.  This laboratory is certified under the Clinical Laboratory Improvement Amendments CLIA as qualified to perform high complexity clinical laboratory testing.    TRIMETH /SULFA  Value in next row Sensitive      <=4 SENSITIVEThis is a modified FDA-approved test that has been validated and its performance characteristics determined by the reporting laboratory.  This laboratory is certified under the Clinical Laboratory Improvement Amendments CLIA as qualified to perform high complexity clinical laboratory testing.    AMPICILLIN/SULBACTAM  Value in next row Sensitive      <=4 SENSITIVEThis is a modified FDA-approved test that has been validated and its performance characteristics determined by the reporting laboratory.  This laboratory is certified under the Clinical Laboratory Improvement Amendments CLIA as qualified to perform high complexity clinical laboratory testing.    PIP/TAZO Value in next row Sensitive      <=4 SENSITIVEThis is a modified FDA-approved test that has been validated and its performance characteristics determined by the reporting laboratory.  This laboratory is certified under the Clinical Laboratory Improvement Amendments CLIA as qualified to perform high complexity clinical laboratory testing.    * RARE ESCHERICHIA COLI   Imaging US  ARTERIAL ABI (SCREENING LOWER EXTREMITY) Result Date: 05/21/2024 EXAM: VASCULAR SCREENING 05/21/2024 11:55:16 AM CLINICAL HISTORY: Diabetic foot ulcer (HCC) [id number]. TECHNIQUE: The ABIs were also acquired. COMPARISON: None available. FINDINGS: ANKLE BRACHIAL INDEX: Right: 1.26 Left: 1.26 ABIs were within normal range. Multiphasic waveforms reported in the distal calf bilaterally. IMPRESSION: 1. No evidence of hemodynamically significant lower extremity arterial occlusive disease at rest. Electronically signed by: Katheleen Faes MD 05/21/2024 12:22 PM EDT RP Workstation: [id number]   MR FOOT RIGHT WO CONTRAST Result Date: 05/21/2024 EXAM: MRI of the right Foot without contrast. 05/21/2024 11:36:46 AM TECHNIQUE: Multiplanar multisequence MRI of the right foot was performed from the posterior subtalar joint through the stump without the administration of intravenous contrast. COMPARISON: Radiographs 05/21/2024. CLINICAL HISTORY: Right foot swelling, diabetic, suspected osteomyelitis, infection, and fever. FINDINGS: LISFRANC JOINT: Visualized Lisfranc ligament is intact. No significant Lisfranc interval widening or significant periligamentous edema. BONE MARROW: Trace endosteal edema along  the distal margins of the 2nd and 3rd, probably reactive and not currently considered specific for osteomyelitis. No acute fracture or aggressive marrow replacing lesion. GREATER AND LESSER MTP JOINTS: No significant joint effusion or osseous erosions. No significant degenerative changes. Normal alignment. SOFT TISSUES: Out distal ulceration along the plantar stump margin as shown on image 18 series 6 likely accounting for the gas density in this vicinity on prior radiography. Dorsal to the 2nd and 3rd metatarsals, a 4.2 x 1.5 x 1.8 cm fluid collection is present and suspicious for abscess. This is probably spontaneously draining to the skin surface through a branching sinus tract anteriorly on images 13 through 17 of series 6 and speckled low signal internally in the fluid collection may represent gas. Appearance compatible with abscess potentially spontaneously draining. Surrounding subcutaneous edema along the dorsum of the foot may well reflect cellulitis. Regional muscular atrophy. TENDONS: Tibialis posterior, flexor digitorum longus, and flexor hallucis longus tenosynovitis noted. IMPRESSION: 1. Dorsal fluid collection in the soft tissues above the remaining 2nd  and 3rd metatarsals (4.2 x 1.5 x 1.8 cm) suspicious for abscess, potentially spontaneously draining to the anterior cutaneous surface , with surrounding subcutaneous edema compatible with cellulitis. 2. Distal ulceration along the plantar stump margin, likely accounting for the gas density seen on prior radiography. 3. Trace endosteal edema along the distal margins of the 2nd and 3rd metatarsals, likely reactive and not specific for osteomyelitis. 4. Tibialis posterior, flexor digitorum longus, and flexor hallucis longus tenosynovitis. 5. Regional muscular atrophy. Electronically signed by: Ryan Salvage MD 05/21/2024 11:50 AM EDT RP Workstation: [id number]   DG Foot Complete Right Result Date: 05/21/2024 EXAM: 3 OR MORE VIEW(S) XRAY OF THE  RIGHT FOOT 05/21/2024 08:36:00 AM COMPARISON: 12/21/2016 CLINICAL HISTORY: Foot infection. 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 FINDINGS: BONES AND JOINTS: Transmetatarsal amputation of the first through fifth digits. No current bony erosive or destructive findings to specifically indicate osteomyelitis. Plantar calcaneal spur. No joint dislocation. SOFT TISSUES: Soft tissue swelling overlying the amputation stump gas density collection along the plantar distal stump compatible with a large ulceration or localized infection involving the soft tissues, correlate with visual inspection. Atheromatous vascular calcifications. IMPRESSION: 1. Soft tissue swelling and gas along the plantar distal stump, compatible with a large ulceration or localized soft tissue infection. 2. No radiographic evidence of osteomyelitis. 3. Atheromatous vascular calcifications. 4. Plantar calcaneal spur. Electronically signed by: Ryan Salvage MD 05/21/2024 09:04 AM EDT RP Workstation: [id number]      Time coordinating discharge: over 30 minutes  SIGNED:  Tayvien Kane DO [facility]

## 2024-05-26 NOTE — Progress Notes (Addendum)
 Pt's wife is still refusing discharge this afternoon. She states she will not take pt home until/unless he has a bowel movement. Patient has not complained of constipation any time I have rounded / any nursing assessments. Last BM here was 10/31. I have reevaluated the patient in person. He states I can't seem to go but doesn't feel like there is anything there, he is just trying to have a BM and nothing coming out. He states he does not sense an actual urge to defecate. I have assessed the patient at bedside.   Orders placed for laxatives/enema as wife has left the building again so pt still here uncertain how long.    ADDENDUM  Per RN, wife is still refusing to take patient home, see RN note I have reduced opiate meds for now as this can of course contribute to constipation - pt is long term opiate dependent 50 mg oxycodone  total daily outpatient so will not dc altogether but will Rx for 5 mg tabs q4h prn Acetaminophen  ordered BMP to confirm normal renal function and if so can have NSAID IV or po if needed, will sign out to night coverage to follow on this

## 2024-05-26 NOTE — TOC Transition Note (Signed)
 Transition of Care [facility]) - Discharge Note   Patient Details  Name: [name] MRN: [id number] Date of Birth: Dec 06, 1955  [facility] CM/SW Contact:  Alvaro Louder, LCSW Phone Number: 05/26/2024, 1:00 PM   Clinical Narrative:   LCSWA confirmed with MD that patient is stable for discharge. LCSWA notified the patient and they are in agreement with discharge. LCSWA reached out to [facility] admissions coordinator an started service for patient. The patient reported that he would have his spouse come pick him up at discharge.  TOC signing off  Final next level of care: Home w Home Health Services Barriers to Discharge: No Barriers Identified   Patient Goals and CMS Choice            Discharge Placement              Patient chooses bed at:  (Home) Patient to be transferred to facility by: Ruby Name of family member notified: Self Patient and family notified of of transfer: 05/26/24  Discharge Plan and Services Additional resources added to the After Visit Summary for                  DME Arranged: 3-N-1, Walker rolling, Other see comment (Knee Scooter) DME Agency: [employer] Date DME Agency Contacted: 05/24/24   Representative spoke with at DME Agency: Contacted via email HH Arranged: PT, OT HH Agency: [facility] Date HH Agency Contacted: 05/26/24   Representative spoke with at [facility] Agency: Shaun  Social Drivers of Health (SDOH) Interventions SDOH Screenings   Food Insecurity: No Food Insecurity (05/21/2024)  Housing: Low Risk  (05/21/2024)  Transportation Needs: No Transportation Needs (05/21/2024)  Utilities: Not At Risk (05/21/2024)  Alcohol  Screen: Low Risk  (03/04/2024)  Depression (PHQ2-9): Medium Risk (05/02/2024)  Financial Resource Strain: Low Risk  (05/13/2024)   Received from [facility]  Physical Activity: Inactive (03/03/2024)  Social Connections: Unknown (05/21/2024)  Stress: No Stress  Concern Present (03/03/2024)  Tobacco Use: Low Risk  (05/21/2024)  Health Literacy: Adequate Health Literacy (03/04/2024)     Readmission Risk Interventions     No data to display

## 2024-05-26 NOTE — Progress Notes (Signed)
 Present during conversation with patient and provider Alexander. Alexander informed patient of treatment plan for BM and wife was not present. Wife returns to bedside inquiring about care plan. Informed wife of new orders and wife stated that even if the patient does have a bowel movement tonight, she was advised by [employer] that the patient is not to discharge until tomorrow following bowel movement and additional assessment .

## 2024-05-27 ENCOUNTER — Other Ambulatory Visit: Payer: Self-pay

## 2024-05-27 ENCOUNTER — Telehealth: Payer: Self-pay

## 2024-05-27 DIAGNOSIS — E11628 Type 2 diabetes mellitus with other skin complications: Secondary | ICD-10-CM | POA: Diagnosis not present

## 2024-05-27 DIAGNOSIS — L089 Local infection of the skin and subcutaneous tissue, unspecified: Secondary | ICD-10-CM | POA: Diagnosis not present

## 2024-05-27 LAB — GLUCOSE, CAPILLARY
Glucose-Capillary: 143 mg/dL — ABNORMAL HIGH (ref 70–99)
Glucose-Capillary: 138 mg/dL — ABNORMAL HIGH (ref 70–99)

## 2024-05-27 LAB — SURGICAL PATHOLOGY

## 2024-05-27 NOTE — Progress Notes (Signed)
 This RN entered patient's room after knocking and introduced myself as a nurse who helps with discharges. Explained I understood the patient was to discharge today, and he agreed. Asked who would be taking him home from here, and he stated his wife would be here after lunch to pick him up because that's when she could get off work to get him. I asked if he would need her to bring anything to prevent discharge such as oxygen, shoes, clothing, etc.; patient states he already talked to her about that and would not elaborate further. Provider and primary RN notified

## 2024-05-27 NOTE — Progress Notes (Signed)
 Discharge instructions went over with wife and patient. No further questions.   Patient wheeled out.[name]

## 2024-05-27 NOTE — Discharge Summary (Signed)
 Physician Discharge Summary   Patient: [name] MRN: [id number]  DOB: 09-30-1955   Admit:     Date of Admission: 05/21/2024 Admitted from: home   Discharge: Date of discharge: 05/25/24 DC order placed wife refused discharge, 05/27/24 on reassessment this morning pt is medically table for discharge and plan s for dc once wife comes to get him  Disposition: Home health Condition at discharge: good  CODE STATUS: FULL CODE     Discharge Physician: Laneta Blunt, DO [facility]     PCP: Jimmy Charlie FERNS, MD  Recommendations for Outpatient Follow-up:  Follow up with PCP Jimmy Charlie FERNS, MD in 1-2 weeks Follow up with Dr Ashley podiatry within 1 week    Discharge Instructions     Diet - low sodium heart healthy   Complete by: As directed    Discharge wound care:   Complete by: As directed    Per podiatry recommendations   Increase activity slowly   Complete by: As directed          Discharge Diagnoses: Principal Problem:   Diabetic foot infection (HCC) Active Problems:   Diabetic foot ulcer with osteomyelitis (HCC)   PAD (peripheral artery disease)        Hospital course / significant events:   [name] is a 68 y.o. male with medical history significant of IDDM, diabetic foot infection status post right transmetatarsal amputation, HTN, HLD, PVD, alcohol  abuse, psoriasis, Schatzki's ring, diabetic neuropathy, sent from podiatrist office for evaluation of worsening of right foot infection. Patient has a chronic right foot ulcer developed 2 to 3 months ago, for which he has been followed by outpatient wound care and podiatry. This time he started noticed increasing rash and swelling of the right foot, and started to have low-grade fever last 2 days with Tmax 101 last night. He went to see podiatry 10/29 who sent patient to ED   10/29: MRI showed right foot abscess and tenosynovitis but no signs of osteomyelitis - admitted to hospitalist.  Vascular consult - would proceed w/ transmetatarsal amputation but low threshold for angio +/- BKA next week if concern for severe small vessel disease / nonhealing  10/30: stable, await surgery tomorrow, remain on IV abx 10/31: underwent surgery as below w/ I&D and bone excision, implantation abx beads. Pend cultures/pathology but podiatry will follow in office  11/01: podiatry Continue to monitor cultures. Suspect in 1 to 2 days patient will be stable for discharge. PT/OT following. 11/02: per podiatry ok for dicsharge. NWB. OK for Duricef 500 mg twice daily + Cipro  500 mg twice daily. x10 days. Wife states she was told could dc tomorrow so does not have equipment or house ready yet. Pt is medically stable, dc orders placed, abx and synthroid rx in place.  11/03: TOC confirms all DME and HH, pt got another dose IV abx which was not medically necessary but was reasonable given he was still here. Discharge order remains and d/w staff confirming he is stable for discharge. Later afternoon, wife still refusing to take him stating constipation. Pt has had NO complaints of such other that we discussed chronci constipation earlier this admission which is attributed to hypothyroid new dx. Recommended dc w/ OTC regimen. Wife refused, see notes. Pt was provided laxatives here. Opiates reduced and BMP rechecked to ensure renal fxn safe for NSAID - renal fxn appropriate, sodium low but c/w hypothyroid / chronic hyponatremia (see A/P) 11/04: confirmed medically stable. Pt has had  multiple BM. Follow up w/ podiatry as directed and needs PCP appt to address thyroid.      Consultants:  Podiatry Vascular surgery   Procedures/Surgeries: 05/23/24 1. Percutaneous tendo Achilles lengthening right lower extremity 2. I&D abscess distal transmetatarsal amputation site 3. Excision of bone distal second metatarsal 4. Excision of bone distal third metatarsal 5. Excision of bone distal fourth metatarsal 6. Intraoperative  fluoroscopy without assistance of radiologist 7. Implantation antibiotic impregnated beads       ASSESSMENT & PLAN:   Right foot diabetic ulcer infection - sepsis RULED OUT Right foot abscess formation Right foot infective tenosynovitis PAD with right lower extremity transmetatarsal amputation from October 2024.  S/p I&D see above Duricef 500 mg twice daily + Cipro  500 mg twice daily. x10 days.   Patient will remain nonweightbearing.  dressing changes - simple dry dressing.  This can be changed 3 times a week.  Podiatry discussed this with the patient as well as his wife yesterday and they felt comfortable to perform this.  Podiatry recommended follow-up with Dr Ashley in 1 week. From medical and podiatry standpoint patient has been stable for discharge but wife has been declining to leave - see progress notes     PVD/PAD Chronic claudication HLD Arterial Doppler no concerns Vascular consult - more concern for small vessel disease, can get angio if surgical intervention gives any concern for insufficient blood flow, and would consider for BKA - podiatry to follow outpatient and involve vascular as needed  Continue statin Follow outpatient    IDDM with hyperglycemia Hx nonadherence to recommended therapy  Resume home meds    Diabetic neuropathy Gabapentin    Chronic hyponatremia Euvolemic Clinically suspect hyponatremia secondary to alcohol  abuse / hypothyroid  Likely w/ hypothyroid and EtOH - treat as below  EtOH abstinence encouraged  Monitor BMP outpatient    Chronic anemia H&H stable Monitor CBC outpatient   Essential HTN Amlodipine   Lasix   Losartan    Hypothyroid TSH >10 (Not probably also contributing to hyponatremia)  Add on T4 - WNL TSH >10 and sytmpoms over past few mos (wife notes more sluggish, weight gain, constipation) will start synthroid  Outpatient follow up   Anx/Dep Cymbalta    BPH Finasteride   Tamsulosin    GERD PPI   Class 1 obesity  based on BMI: Body mass index is 29.84 kg/m.[name] Significantly low or high BMI is associated with higher medical risk.  Underweight - under 18  overweight - 25 to 29 obese - 30 or more Class 1 obesity: BMI of 30.0 to 34 Class 2 obesity: BMI of 35.0 to 39 Class 3 obesity: BMI of 40.0 to 49 Super Morbid Obesity: BMI 50-59 Super-super Morbid Obesity: BMI 60+ Healthy nutrition and physical activity advised as adjunct to other disease management and risk reduction treatments            Discharge Instructions  Allergies as of 05/27/2024       Reactions   Glipizide Other (See Comments)   REACTION: wt. gain, hands tingling   Lisinopril  Other (See Comments)   HYPERKALEMIA   Ramipril Cough   Ibuprofen    UNSPECIFIED REACTION    Bactrim  [sulfamethoxazole -trimethoprim ] Nausea And Vomiting, Rash        Medication List     STOP taking these medications    omeprazole  20 MG capsule Commonly known as: PRILOSEC       TAKE these medications    Alcohol  Prep 70 % Pads USE 1 PREP PAD TWICE DAILY  Alpha-Lipoic Acid 600 MG Tabs Take by mouth. 3 in am and 1 in pm   amLODipine  5 MG tablet Commonly known as: NORVASC  TAKE 1 TABLET(5 MG) BY MOUTH DAILY   B-D UF III MINI PEN NEEDLES 31G X 5 MM Misc Generic drug: Insulin  Pen Needle USE AS DIRECTED DAILY   BD SafetyGlide Syringe/Needle 25G X 1 3 ML Misc Generic drug: SYRINGE-NEEDLE (DISP) 3 ML Use the needle for IM injection once a  month; or as directed.   CALCIUM-MAGNESIUM -ZINC  PO Take by mouth.   cefadroxil 500 MG capsule Commonly known as: DURICEF Take 1 capsule (500 mg total) by mouth 2 (two) times daily for 10 days.   celecoxib 200 MG capsule Commonly known as: CELEBREX Take 200 mg by mouth 2 (two) times daily.   Cinnamon 500 MG capsule Take 500 mg by mouth 2 (two) times daily.   ciprofloxacin  500 MG tablet Commonly known as: Cipro  Take 1 tablet (500 mg total) by mouth 2 (two) times daily for 10 days.    clobetasol cream 0.05 % Commonly known as: TEMOVATE Apply 1 Application topically 2 (two) times daily.   Contour Next Test test strip Generic drug: glucose blood USE 1 TO 2 TIMES DAILY TO CHECK  BLOOD SUGAR   cyanocobalamin  1000 MCG/ML injection Commonly known as: VITAMIN B12 1 ml injection-once a month.   dapagliflozin  propanediol 5 MG Tabs tablet Commonly known as: Farxiga  Take 1 tablet (5 mg total) by mouth daily.   DULoxetine  60 MG capsule Commonly known as: CYMBALTA  Take 2 capsules (120 mg total) by mouth daily.   finasteride  5 MG tablet Commonly known as: PROSCAR  Take 1 tablet (5 mg total) by mouth daily.   furosemide  20 MG tablet Commonly known as: LASIX  Take 1 tablet (20 mg total) by mouth daily as needed.   gabapentin  400 MG capsule Commonly known as: NEURONTIN  Take 2 capsules (800 mg total) by mouth 3 (three) times daily.   hydrocortisone  valerate ointment 0.2 % Commonly known as: WEST-CORT APPLY TOPICALLY TO AFFECTED  AREA(S) TWICE DAILY   hydrOXYzine  25 MG capsule Commonly known as: VISTARIL  TAKE 1 CAPSULE(25 MG) BY MOUTH EVERY 8 HOURS AS NEEDED FOR ITCHING   Lantus  SoloStar 100 UNIT/ML Solostar Pen Generic drug: insulin  glargine Inject 24 Units into the skin daily.   levothyroxine 100 MCG tablet Commonly known as: SYNTHROID Take 1 tablet (100 mcg total) by mouth daily at 6 (six) AM.   losartan  25 MG tablet Commonly known as: COZAAR  TAKE 1 TABLET(25 MG) BY MOUTH DAILY   metFORMIN  1000 MG tablet Commonly known as: GLUCOPHAGE  Take 1 tablet (1,000 mg total) by mouth 2 (two) times daily with a meal.   montelukast 10 MG tablet Commonly known as: SINGULAIR Take 10 mg by mouth daily.   multivitamin with minerals Tabs tablet Take 1 tablet by mouth daily.   Oxycodone  HCl 10 MG Tabs Take 1 tablet by mouth 5 (five) times daily.   pantoprazole 40 MG tablet Commonly known as: PROTONIX Take 1 tablet (40 mg total) by mouth daily.   simvastatin  40  MG tablet Commonly known as: ZOCOR  Take 1 tablet (40 mg total) by mouth at bedtime.   tamsulosin  0.4 MG Caps capsule Commonly known as: FLOMAX  Take 1 capsule (0.4 mg total) by mouth daily.   tirzepatide  5 MG/0.5ML Pen Commonly known as: MOUNJARO  Inject 5 mg into the skin once a week.   triamcinolone  lotion 0.1 % Commonly known as: KENALOG  APPLY TO AFFECTED AREA(S)  TOPICALLY  3 TIMES DAILY               Durable Medical Equipment  (From admission, onward)           Start     Ordered   05/24/24 1336  For home use only DME Other see comment  Once       Comments: ROLLING KNEE SCOOTER  Question:  Length of Need  Answer:  6 Months   05/24/24 1335   05/24/24 1334  DME 3-in-1  (Discharge Planning)  Once        05/24/24 1335   05/24/24 1334  DME Walker rolling  (Discharge Planning)  Once       Question Answer Comment  Walker: With 5 Inch Wheels   Patient needs a walker to treat with the following condition Diabetic foot ulcer (HCC)      05/24/24 1335              Discharge Care Instructions  (From admission, onward)           Start     Ordered   05/25/24 0000  Discharge wound care:       Comments: Per podiatry recommendations   05/25/24 1258             Contact information for follow-up providers     Ashley Soulier, DPM. Call on 06/03/2024.   Specialty: Podiatry Contact information: [address] [place] [address] [phone]              Contact information for after-discharge care     Home Medical Care     [facility] .   Service: Home Health Services Contact information: [phone] -[address] [place]  [address] [phone]                     Allergies  Allergen Reactions   Glipizide Other (See Comments)    REACTION: wt. gain, hands tingling   Lisinopril  Other (See Comments)    HYPERKALEMIA   Ramipril Cough   Ibuprofen     UNSPECIFIED REACTION    Bactrim   [Sulfamethoxazole -Trimethoprim ] Nausea And Vomiting and Rash     Subjective: Pt has no complaints or concerns this morning. He states multiple BM overnight, no abd pain, tolerating diet now. All questions answered.    Discharge Exam: BP (!) 143/72 (BP Location: Right Arm)   Pulse 81   Temp 98.3 F (36.8 C)   Resp 16   Ht 6' (1.829 m)   Wt 99.8 kg   SpO2 99%   BMI 29.84 kg/m  General: Pt is alert, awake, not in acute distress. Oriented appropriately Cardiovascular: RRR, S1/S2 +, no rubs, no gallops Respiratory: CTA bilaterally, no wheezing, no rhonchi Abdominal: Soft, NT, ND, bowel sounds +WNL Extremities: operative limb was in boot and wrapped, no proximal erythema/edema.      The results of significant diagnostics from this hospitalization (including imaging, microbiology, ancillary and laboratory) are listed below for reference.     Microbiology: Recent Results (from the past 240 hours)  Aerobic/Anaerobic Culture w Gram Stain (surgical/deep wound)     Status: None   Collection Time: 05/20/24 12:13 PM   Specimen: Abscess  Result Value Ref Range Status   Specimen Description   Final    ABSCESS Performed at [facility], [address], [place], [place] [address]    Special Requests   Final    ABSCESS OF RIGHT FOOT Performed at [facility], [address] [place], [place] [address]    Gram Stain NO WBC SEEN NO ORGANISMS SEEN   Final   Culture   Final    FEW ENTEROBACTER CLOACAE FEW GROUP B STREP(S.AGALACTIAE)ISOLATED TESTING AGAINST S. AGALACTIAE NOT ROUTINELY PERFORMED DUE TO PREDICTABILITY OF AMP/PEN/VAN SUSCEPTIBILITY. NO ANAEROBES ISOLATED Performed at [facility], [address], [place], [place] [address]    Report Status 05/25/2024 FINAL  Final   Organism ID, Bacteria ENTEROBACTER CLOACAE  Final      Susceptibility   Enterobacter cloacae - MIC*    CEFEPIME  <=0.12 SENSITIVE Sensitive     ERTAPENEM <=0.12 SENSITIVE Sensitive      CIPROFLOXACIN  <=0.06 SENSITIVE Sensitive     GENTAMICIN  <=1 SENSITIVE Sensitive     MEROPENEM <=0.25 SENSITIVE Sensitive     TRIMETH /SULFA  <=20 SENSITIVE Sensitive     PIP/TAZO Value in next row Sensitive      <=4 SENSITIVEThis is a modified FDA-approved test that has been validated and its performance characteristics determined by the reporting laboratory.  This laboratory is certified under the Clinical Laboratory Improvement Amendments CLIA as qualified to perform high complexity clinical laboratory testing.    * FEW ENTEROBACTER CLOACAE  Blood Culture (routine x 2)     Status: None   Collection Time: 05/21/24  8:36 AM   Specimen: BLOOD RIGHT ARM  Result Value Ref Range Status   Specimen Description BLOOD RIGHT ARM  Final   Special Requests   Final    BOTTLES DRAWN AEROBIC AND ANAEROBIC Blood Culture results may not be optimal due to an inadequate volume of blood received in culture bottles   Culture   Final    NO GROWTH 5 DAYS Performed at [facility], [address] [place], [place] [address]    Report Status 05/26/2024 FINAL  Final  Blood Culture (routine x 2)     Status: None   Collection Time: 05/21/24  8:36 AM   Specimen: BLOOD RIGHT ARM  Result Value Ref Range Status   Specimen Description BLOOD RIGHT ARM  Final   Special Requests   Final    BOTTLES DRAWN AEROBIC AND ANAEROBIC Blood Culture results may not be optimal due to an inadequate volume of blood received in culture bottles   Culture   Final    NO GROWTH 5 DAYS Performed at [facility], [address] [place], [place] [address]    Report Status 05/26/2024 FINAL  Final  MRSA Next Gen by PCR, Nasal     Status: None   Collection Time: 05/21/24  6:00 PM   Specimen: Nasal Mucosa; Nasal Swab  Result Value Ref Range Status   MRSA by PCR Next Gen NOT DETECTED NOT DETECTED Final    Comment: (NOTE) The GeneXpert MRSA Assay (FDA approved for NASAL specimens only), is one component of a  comprehensive MRSA colonization surveillance program. It is not intended to diagnose MRSA infection nor to guide or monitor treatment for MRSA infections. Test performance is not FDA approved in patients less than 29 years old. Performed at [facility], [address] [place], [place] [address]   Aerobic/Anaerobic Culture w Gram Stain (surgical/deep wound)     Status: None (Preliminary result)   Collection Time: 05/23/24  1:00 PM   Specimen: Path Tissue  Result Value Ref Range Status   Specimen Description   Final    ULCER RIGHT FOOT ULCER Performed at [facility], [address] [place], [place]  [address]    Special Requests   Final    NONE Performed at [facility], [address] [place], [place] [address]    Gram Stain   Final    RARE WBC PRESENT, PREDOMINANTLY PMN NO ORGANISMS SEEN Performed at [facility], [address] [place], [place] [address]    Culture   Final    RARE ENTEROBACTER CLOACAE RARE ESCHERICHIA COLI NO ANAEROBES ISOLATED; CULTURE IN PROGRESS FOR 5 DAYS    Report Status PENDING  Incomplete   Organism ID, Bacteria ENTEROBACTER CLOACAE  Final   Organism ID, Bacteria ESCHERICHIA COLI  Final      Susceptibility   Enterobacter cloacae - MIC*    CEFEPIME  <=0.12 SENSITIVE Sensitive     ERTAPENEM <=0.12 SENSITIVE Sensitive     CIPROFLOXACIN  <=0.06 SENSITIVE Sensitive     GENTAMICIN  <=1 SENSITIVE Sensitive     MEROPENEM <=0.25 SENSITIVE Sensitive     TRIMETH /SULFA  <=20 SENSITIVE Sensitive     PIP/TAZO Value in next row Sensitive      <=4 SENSITIVEThis is a modified FDA-approved test that has been validated and its performance characteristics determined by the reporting laboratory.  This laboratory is certified under the Clinical Laboratory Improvement Amendments CLIA as qualified to perform high complexity clinical laboratory testing.    * RARE ENTEROBACTER CLOACAE   Escherichia coli - MIC*    AMPICILLIN Value in next  row Resistant      <=4 SENSITIVEThis is a modified FDA-approved test that has been validated and its performance characteristics determined by the reporting laboratory.  This laboratory is certified under the Clinical Laboratory Improvement Amendments CLIA as qualified to perform high complexity clinical laboratory testing.    CEFAZOLIN  (NON-URINE) Value in next row Sensitive      <=4 SENSITIVEThis is a modified FDA-approved test that has been validated and its performance characteristics determined by the reporting laboratory.  This laboratory is certified under the Clinical Laboratory Improvement Amendments CLIA as qualified to perform high complexity clinical laboratory testing.    CEFEPIME  Value in next row Sensitive      <=4 SENSITIVEThis is a modified FDA-approved test that has been validated and its performance characteristics determined by the reporting laboratory.  This laboratory is certified under the Clinical Laboratory Improvement Amendments CLIA as qualified to perform high complexity clinical laboratory testing.    ERTAPENEM Value in next row Sensitive      <=4 SENSITIVEThis is a modified FDA-approved test that has been validated and its performance characteristics determined by the reporting laboratory.  This laboratory is certified under the Clinical Laboratory Improvement Amendments CLIA as qualified to perform high complexity clinical laboratory testing.    CEFTRIAXONE  Value in next row Sensitive      <=4 SENSITIVEThis is a modified FDA-approved test that has been validated and its performance characteristics determined by the reporting laboratory.  This laboratory is certified under the Clinical Laboratory Improvement Amendments CLIA as qualified to perform high complexity clinical laboratory testing.    CIPROFLOXACIN  Value in next row Sensitive      <=4 SENSITIVEThis is a modified FDA-approved test that has been validated and its performance characteristics determined by the reporting  laboratory.  This laboratory is certified under the Clinical Laboratory Improvement Amendments CLIA as qualified to perform high complexity clinical laboratory testing.    GENTAMICIN  Value in next row Sensitive      <=4 SENSITIVEThis is a modified FDA-approved test that has been validated and its performance characteristics determined by  the reporting laboratory.  This laboratory is certified under the Clinical Laboratory Improvement Amendments CLIA as qualified to perform high complexity clinical laboratory testing.    MEROPENEM Value in next row Sensitive      <=4 SENSITIVEThis is a modified FDA-approved test that has been validated and its performance characteristics determined by the reporting laboratory.  This laboratory is certified under the Clinical Laboratory Improvement Amendments CLIA as qualified to perform high complexity clinical laboratory testing.    TRIMETH /SULFA  Value in next row Sensitive      <=4 SENSITIVEThis is a modified FDA-approved test that has been validated and its performance characteristics determined by the reporting laboratory.  This laboratory is certified under the Clinical Laboratory Improvement Amendments CLIA as qualified to perform high complexity clinical laboratory testing.    AMPICILLIN/SULBACTAM Value in next row Sensitive      <=4 SENSITIVEThis is a modified FDA-approved test that has been validated and its performance characteristics determined by the reporting laboratory.  This laboratory is certified under the Clinical Laboratory Improvement Amendments CLIA as qualified to perform high complexity clinical laboratory testing.    PIP/TAZO Value in next row Sensitive      <=4 SENSITIVEThis is a modified FDA-approved test that has been validated and its performance characteristics determined by the reporting laboratory.  This laboratory is certified under the Clinical Laboratory Improvement Amendments CLIA as qualified to perform high complexity clinical laboratory  testing.    * RARE ESCHERICHIA COLI     Labs: BNP (last 3 results) No results for input(s): BNP in the last 8760 hours. Basic Metabolic Panel: Recent Labs  Lab 05/21/24 0803 05/22/24 0400 05/23/24 0354 05/26/24 1908  NA 126* 129* 130* 128*  K 4.5 4.5 4.3 4.4  CL 90* 95* 98 94*  CO2 22 21* 23 25  GLUCOSE 199* 128* 125* 160*  BUN 27* 20 16 17   CREATININE 1.27* 1.12 1.02 1.14  CALCIUM 8.6* 8.2* 8.1* 8.4*   Liver Function Tests: Recent Labs  Lab 05/21/24 0803  AST 50*  ALT 56*  ALKPHOS 211*  BILITOT 0.9  PROT 6.8  ALBUMIN 2.7*   No results for input(s): LIPASE, AMYLASE in the last 168 hours. No results for input(s): AMMONIA in the last 168 hours. CBC: Recent Labs  Lab 05/21/24 0803 05/23/24 0354  WBC 14.1* 10.9*  NEUTROABS 11.9*  --   HGB 8.9* 8.3*  HCT 27.9* 25.5*  MCV 87.5 86.1  PLT 427* 419*   Cardiac Enzymes: No results for input(s): CKTOTAL, CKMB, CKMBINDEX, TROPONINI in the last 168 hours. BNP: Invalid input(s): POCBNP CBG: Recent Labs  Lab 05/25/24 2110 05/26/24 0750 05/26/24 1210 05/26/24 2217 05/27/24 0814  GLUCAP 157* 98 106* 146* 143*   D-Dimer No results for input(s): DDIMER in the last 72 hours. Hgb A1c No results for input(s): HGBA1C in the last 72 hours. Lipid Profile No results for input(s): CHOL, HDL, LDLCALC, TRIG, CHOLHDL, LDLDIRECT in the last 72 hours. Thyroid function studies No results for input(s): TSH, T4TOTAL, T3FREE, THYROIDAB in the last 72 hours.  Invalid input(s): FREET3 Anemia work up No results for input(s): VITAMINB12, FOLATE, FERRITIN, TIBC, IRON, RETICCTPCT in the last 72 hours. Urinalysis    Component Value Date/Time   COLORURINE YELLOW (A) 05/21/2024 0836   APPEARANCEUR HAZY (A) 05/21/2024 0836   APPEARANCEUR Clear 06/27/2022 1511   LABSPEC 1.016 05/21/2024 0836   PHURINE 5.0 05/21/2024 0836   GLUCOSEU >=500 (A) 05/21/2024 0836   HGBUR NEGATIVE  05/21/2024 0836   BILIRUBINUR  NEGATIVE 05/21/2024 0836   BILIRUBINUR Negative 06/27/2022 1511   KETONESUR NEGATIVE 05/21/2024 0836   PROTEINUR NEGATIVE 05/21/2024 0836   NITRITE NEGATIVE 05/21/2024 0836   LEUKOCYTESUR NEGATIVE 05/21/2024 0836   Sepsis Labs Recent Labs  Lab 05/21/24 0803 05/23/24 0354  WBC 14.1* 10.9*   Microbiology Recent Results (from the past 240 hours)  Aerobic/Anaerobic Culture w Gram Stain (surgical/deep wound)     Status: None   Collection Time: 05/20/24 12:13 PM   Specimen: Abscess  Result Value Ref Range Status   Specimen Description   Final    ABSCESS Performed at [facility], [address] [place], [place] [address]    Special Requests   Final    ABSCESS OF RIGHT FOOT Performed at [facility], [address] [place], [place] [address]    Gram Stain NO WBC SEEN NO ORGANISMS SEEN   Final   Culture   Final    FEW ENTEROBACTER CLOACAE FEW GROUP B STREP(S.AGALACTIAE)ISOLATED TESTING AGAINST S. AGALACTIAE NOT ROUTINELY PERFORMED DUE TO PREDICTABILITY OF AMP/PEN/VAN SUSCEPTIBILITY. NO ANAEROBES ISOLATED Performed at [facility], [address], [place], [place] [address]    Report Status 05/25/2024 FINAL  Final   Organism ID, Bacteria ENTEROBACTER CLOACAE  Final      Susceptibility   Enterobacter cloacae - MIC*    CEFEPIME  <=0.12 SENSITIVE Sensitive     ERTAPENEM <=0.12 SENSITIVE Sensitive     CIPROFLOXACIN  <=0.06 SENSITIVE Sensitive     GENTAMICIN  <=1 SENSITIVE Sensitive     MEROPENEM <=0.25 SENSITIVE Sensitive     TRIMETH /SULFA  <=20 SENSITIVE Sensitive     PIP/TAZO Value in next row Sensitive      <=4 SENSITIVEThis is a modified FDA-approved test that has been validated and its performance characteristics determined by the reporting laboratory.  This laboratory is certified under the Clinical Laboratory Improvement Amendments CLIA as qualified to perform high complexity clinical laboratory testing.    * FEW  ENTEROBACTER CLOACAE  Blood Culture (routine x 2)     Status: None   Collection Time: 05/21/24  8:36 AM   Specimen: BLOOD RIGHT ARM  Result Value Ref Range Status   Specimen Description BLOOD RIGHT ARM  Final   Special Requests   Final    BOTTLES DRAWN AEROBIC AND ANAEROBIC Blood Culture results may not be optimal due to an inadequate volume of blood received in culture bottles   Culture   Final    NO GROWTH 5 DAYS Performed at [facility], [address] [place], [place] [address]    Report Status 05/26/2024 FINAL  Final  Blood Culture (routine x 2)     Status: None   Collection Time: 05/21/24  8:36 AM   Specimen: BLOOD RIGHT ARM  Result Value Ref Range Status   Specimen Description BLOOD RIGHT ARM  Final   Special Requests   Final    BOTTLES DRAWN AEROBIC AND ANAEROBIC Blood Culture results may not be optimal due to an inadequate volume of blood received in culture bottles   Culture   Final    NO GROWTH 5 DAYS Performed at [facility], [address], [place], [place] [address]    Report Status 05/26/2024 FINAL  Final  MRSA Next Gen by PCR, Nasal     Status: None   Collection Time: 05/21/24  6:00 PM   Specimen: Nasal Mucosa; Nasal Swab  Result Value Ref Range Status   MRSA by PCR Next Gen NOT DETECTED NOT  DETECTED Final    Comment: (NOTE) The GeneXpert MRSA Assay (FDA approved for NASAL specimens only), is one component of a comprehensive MRSA colonization surveillance program. It is not intended to diagnose MRSA infection nor to guide or monitor treatment for MRSA infections. Test performance is not FDA approved in patients less than 6 years old. Performed at [facility], [address] [place], [place] [address]   Aerobic/Anaerobic Culture w Gram Stain (surgical/deep wound)     Status: None (Preliminary result)   Collection Time: 05/23/24  1:00 PM   Specimen: Path Tissue  Result Value Ref Range Status   Specimen Description   Final     ULCER RIGHT FOOT ULCER Performed at [facility], [address] [place], [place] [address]    Special Requests   Final    NONE Performed at [facility], [address] [place], [place] [address]    Gram Stain   Final    RARE WBC PRESENT, PREDOMINANTLY PMN NO ORGANISMS SEEN Performed at [facility], [address], [place], [place] [address]    Culture   Final    RARE ENTEROBACTER CLOACAE RARE ESCHERICHIA COLI NO ANAEROBES ISOLATED; CULTURE IN PROGRESS FOR 5 DAYS    Report Status PENDING  Incomplete   Organism ID, Bacteria ENTEROBACTER CLOACAE  Final   Organism ID, Bacteria ESCHERICHIA COLI  Final      Susceptibility   Enterobacter cloacae - MIC*    CEFEPIME  <=0.12 SENSITIVE Sensitive     ERTAPENEM <=0.12 SENSITIVE Sensitive     CIPROFLOXACIN  <=0.06 SENSITIVE Sensitive     GENTAMICIN  <=1 SENSITIVE Sensitive     MEROPENEM <=0.25 SENSITIVE Sensitive     TRIMETH /SULFA  <=20 SENSITIVE Sensitive     PIP/TAZO Value in next row Sensitive      <=4 SENSITIVEThis is a modified FDA-approved test that has been validated and its performance characteristics determined by the reporting laboratory.  This laboratory is certified under the Clinical Laboratory Improvement Amendments CLIA as qualified to perform high complexity clinical laboratory testing.    * RARE ENTEROBACTER CLOACAE   Escherichia coli - MIC*    AMPICILLIN Value in next row Resistant      <=4 SENSITIVEThis is a modified FDA-approved test that has been validated and its performance characteristics determined by the reporting laboratory.  This laboratory is certified under the Clinical Laboratory Improvement Amendments CLIA as qualified to perform high complexity clinical laboratory testing.    CEFAZOLIN  (NON-URINE) Value in next row Sensitive      <=4 SENSITIVEThis is a modified FDA-approved test that has been validated and its performance characteristics determined by the reporting laboratory.  This  laboratory is certified under the Clinical Laboratory Improvement Amendments CLIA as qualified to perform high complexity clinical laboratory testing.    CEFEPIME  Value in next row Sensitive      <=4 SENSITIVEThis is a modified FDA-approved test that has been validated and its performance characteristics determined by the reporting laboratory.  This laboratory is certified under the Clinical Laboratory Improvement Amendments CLIA as qualified to perform high complexity clinical laboratory testing.    ERTAPENEM Value in next row Sensitive      <=4 SENSITIVEThis is a modified FDA-approved test that has been validated and its performance characteristics determined by the reporting laboratory.  This laboratory is certified under the Clinical Laboratory Improvement Amendments CLIA as qualified to perform high complexity clinical laboratory testing.    CEFTRIAXONE  Value in next row Sensitive      <=  4 SENSITIVEThis is a modified FDA-approved test that has been validated and its performance characteristics determined by the reporting laboratory.  This laboratory is certified under the Clinical Laboratory Improvement Amendments CLIA as qualified to perform high complexity clinical laboratory testing.    CIPROFLOXACIN  Value in next row Sensitive      <=4 SENSITIVEThis is a modified FDA-approved test that has been validated and its performance characteristics determined by the reporting laboratory.  This laboratory is certified under the Clinical Laboratory Improvement Amendments CLIA as qualified to perform high complexity clinical laboratory testing.    GENTAMICIN  Value in next row Sensitive      <=4 SENSITIVEThis is a modified FDA-approved test that has been validated and its performance characteristics determined by the reporting laboratory.  This laboratory is certified under the Clinical Laboratory Improvement Amendments CLIA as qualified to perform high complexity clinical laboratory testing.    MEROPENEM  Value in next row Sensitive      <=4 SENSITIVEThis is a modified FDA-approved test that has been validated and its performance characteristics determined by the reporting laboratory.  This laboratory is certified under the Clinical Laboratory Improvement Amendments CLIA as qualified to perform high complexity clinical laboratory testing.    TRIMETH /SULFA  Value in next row Sensitive      <=4 SENSITIVEThis is a modified FDA-approved test that has been validated and its performance characteristics determined by the reporting laboratory.  This laboratory is certified under the Clinical Laboratory Improvement Amendments CLIA as qualified to perform high complexity clinical laboratory testing.    AMPICILLIN/SULBACTAM Value in next row Sensitive      <=4 SENSITIVEThis is a modified FDA-approved test that has been validated and its performance characteristics determined by the reporting laboratory.  This laboratory is certified under the Clinical Laboratory Improvement Amendments CLIA as qualified to perform high complexity clinical laboratory testing.    PIP/TAZO Value in next row Sensitive      <=4 SENSITIVEThis is a modified FDA-approved test that has been validated and its performance characteristics determined by the reporting laboratory.  This laboratory is certified under the Clinical Laboratory Improvement Amendments CLIA as qualified to perform high complexity clinical laboratory testing.    * RARE ESCHERICHIA COLI   Imaging US  ARTERIAL ABI (SCREENING LOWER EXTREMITY) Result Date: 05/21/2024 EXAM: VASCULAR SCREENING 05/21/2024 11:55:16 AM CLINICAL HISTORY: Diabetic foot ulcer (HCC) [id number]. TECHNIQUE: The ABIs were also acquired. COMPARISON: None available. FINDINGS: ANKLE BRACHIAL INDEX: Right: 1.26 Left: 1.26 ABIs were within normal range. Multiphasic waveforms reported in the distal calf bilaterally. IMPRESSION: 1. No evidence of hemodynamically significant lower extremity arterial occlusive disease  at rest. Electronically signed by: Katheleen Faes MD 05/21/2024 12:22 PM EDT RP Workstation: [id number]   MR FOOT RIGHT WO CONTRAST Result Date: 05/21/2024 EXAM: MRI of the right Foot without contrast. 05/21/2024 11:36:46 AM TECHNIQUE: Multiplanar multisequence MRI of the right foot was performed from the posterior subtalar joint through the stump without the administration of intravenous contrast. COMPARISON: Radiographs 05/21/2024. CLINICAL HISTORY: Right foot swelling, diabetic, suspected osteomyelitis, infection, and fever. FINDINGS: LISFRANC JOINT: Visualized Lisfranc ligament is intact. No significant Lisfranc interval widening or significant periligamentous edema. BONE MARROW: Trace endosteal edema along the distal margins of the 2nd and 3rd, probably reactive and not currently considered specific for osteomyelitis. No acute fracture or aggressive marrow replacing lesion. GREATER AND LESSER MTP JOINTS: No significant joint effusion or osseous erosions. No significant degenerative changes. Normal alignment. SOFT TISSUES: Out distal ulceration along the plantar stump margin as shown on  image 18 series 6 likely accounting for the gas density in this vicinity on prior radiography. Dorsal to the 2nd and 3rd metatarsals, a 4.2 x 1.5 x 1.8 cm fluid collection is present and suspicious for abscess. This is probably spontaneously draining to the skin surface through a branching sinus tract anteriorly on images 13 through 17 of series 6 and speckled low signal internally in the fluid collection may represent gas. Appearance compatible with abscess potentially spontaneously draining. Surrounding subcutaneous edema along the dorsum of the foot may well reflect cellulitis. Regional muscular atrophy. TENDONS: Tibialis posterior, flexor digitorum longus, and flexor hallucis longus tenosynovitis noted. IMPRESSION: 1. Dorsal fluid collection in the soft tissues above the remaining 2nd and 3rd metatarsals (4.2 x 1.5 x 1.8  cm) suspicious for abscess, potentially spontaneously draining to the anterior cutaneous surface , with surrounding subcutaneous edema compatible with cellulitis. 2. Distal ulceration along the plantar stump margin, likely accounting for the gas density seen on prior radiography. 3. Trace endosteal edema along the distal margins of the 2nd and 3rd metatarsals, likely reactive and not specific for osteomyelitis. 4. Tibialis posterior, flexor digitorum longus, and flexor hallucis longus tenosynovitis. 5. Regional muscular atrophy. Electronically signed by: Ryan Salvage MD 05/21/2024 11:50 AM EDT RP Workstation: [id number]   DG Foot Complete Right Result Date: 05/21/2024 EXAM: 3 OR MORE VIEW(S) XRAY OF THE RIGHT FOOT 05/21/2024 08:36:00 AM COMPARISON: 12/21/2016 CLINICAL HISTORY: Foot infection. 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 FINDINGS: BONES AND JOINTS: Transmetatarsal amputation of the first through fifth digits. No current bony erosive or destructive findings to specifically indicate osteomyelitis. Plantar calcaneal spur. No joint dislocation. SOFT TISSUES: Soft tissue swelling overlying the amputation stump gas density collection along the plantar distal stump compatible with a large ulceration or localized infection involving the soft tissues, correlate with visual inspection. Atheromatous vascular calcifications. IMPRESSION: 1. Soft tissue swelling and gas along the plantar distal stump, compatible with a large ulceration or localized soft tissue infection. 2. No radiographic evidence of osteomyelitis. 3. Atheromatous vascular calcifications. 4. Plantar calcaneal spur. Electronically signed by: Ryan Salvage MD 05/21/2024 09:04 AM EDT RP Workstation: [id number]      Time coordinating discharge: over 30 minutes  SIGNED:  Lizzie An DO [facility]

## 2024-05-27 NOTE — Progress Notes (Signed)
 PT Cancellation Note  Patient Details Name: [name] MRN: [id number] DOB: [birth year]   Cancelled Treatment:    Reason Eval/Treat Not Completed: Other (comment)  Offered and encourage PT treat before discharge as he has refused the past 2 days despite multiple attempts.  Pt again declined PT interventions.  Risks/benefits explained but he continues to be firm in his flat refusal.   Lauraine Gills 05/27/2024, 10:41 AM

## 2024-05-27 NOTE — Telephone Encounter (Signed)
 Copied from CRM #[id number]. Topic: Appointments - Appointment Scheduling >> May 27, 2024 11:18 AM Charolett L wrote: Patient/patient representative is calling to schedule an appointment.Patient is being discharged 11/04 and needs a to have a hospital follow up but his TOC appt isn't until 09/05/24. As per CAL send a CRM to clinical team and adv that patient needs an appt and a call back to schedule due to not having a PCP at the moment  CB# [phone]

## 2024-05-27 NOTE — Progress Notes (Signed)
 Mobility Specialist - Progress Note   05/27/24 1445  Mobility  Activity Ambulated with assistance  Level of Assistance Standby assist, set-up cues, supervision of patient - no hands on  Assistive Device Front wheel walker  Distance Ambulated (ft) 24 ft  Activity Response Tolerated well  Mobility visit 1 Mobility  Mobility Specialist Start Time (ACUTE ONLY) 1430  Mobility Specialist Stop Time (ACUTE ONLY) 1444  Mobility Specialist Time Calculation (min) (ACUTE ONLY) 14 min   America Silvan Mobility Specialist 05/27/24 2:46 PM

## 2024-05-27 NOTE — Progress Notes (Signed)
 Called and updated the wife around 1400 in regards to culture results, lab work, and home health orders.   Wife verbalized understanding of the plan of care and voices that she will be later in the afternoon to pick up the patient.

## 2024-05-28 ENCOUNTER — Telehealth: Payer: Self-pay

## 2024-05-28 LAB — AEROBIC/ANAEROBIC CULTURE W GRAM STAIN (SURGICAL/DEEP WOUND)

## 2024-05-28 NOTE — Transitions of Care (Post Inpatient/ED Visit) (Signed)
   05/28/2024  Name: [name] MRN: [id number] DOB: [birth year]  Today's TOC FU Call Status: Today's TOC FU Call Status:: Unsuccessful Call (1st Attempt) Unsuccessful Call (1st Attempt) Date: 05/28/24  Attempted to reach the patient regarding the most recent Inpatient/ED visit.  Follow Up Plan: Additional outreach attempts will be made to reach the patient to complete the Transitions of Care (Post Inpatient/ED visit) call.   Arvin Seip RN, BSN, CCM [employer], Population Health Case Manager Phone: [phone]

## 2024-05-29 NOTE — Telephone Encounter (Signed)
 This encounter was created in error - please disregard.

## 2024-05-30 ENCOUNTER — Telehealth: Payer: Self-pay

## 2024-05-30 ENCOUNTER — Other Ambulatory Visit: Payer: Self-pay | Admitting: General Practice

## 2024-05-30 DIAGNOSIS — K219 Gastro-esophageal reflux disease without esophagitis: Secondary | ICD-10-CM

## 2024-05-30 NOTE — Transitions of Care (Post Inpatient/ED Visit) (Signed)
   05/30/2024  Name: [name] MRN: [id number] DOB: 04/30/1956  Today's TOC FU Call Status: Today's TOC FU Call Status:: Unsuccessful Call (2nd Attempt) Unsuccessful Call (2nd Attempt) Date: 05/30/24  Attempted to reach the patient regarding the most recent Inpatient/ED visit.  Follow Up Plan: Additional outreach attempts will be made to reach the patient to complete the Transitions of Care (Post Inpatient/ED visit) call.   Arvin Seip RN, BSN, CCM [employer], Population Health Case Manager Phone: [phone]

## 2024-06-02 ENCOUNTER — Ambulatory Visit: Admitting: General Practice

## 2024-06-02 ENCOUNTER — Ambulatory Visit: Payer: Self-pay | Admitting: General Practice

## 2024-06-02 ENCOUNTER — Encounter: Payer: Self-pay | Admitting: General Practice

## 2024-06-02 VITALS — BP 104/58 | HR 94 | Temp 98.3°F | Ht 72.0 in

## 2024-06-02 DIAGNOSIS — L089 Local infection of the skin and subcutaneous tissue, unspecified: Secondary | ICD-10-CM | POA: Diagnosis not present

## 2024-06-02 DIAGNOSIS — R972 Elevated prostate specific antigen [PSA]: Secondary | ICD-10-CM

## 2024-06-02 DIAGNOSIS — E11628 Type 2 diabetes mellitus with other skin complications: Secondary | ICD-10-CM | POA: Diagnosis not present

## 2024-06-02 DIAGNOSIS — R7989 Other specified abnormal findings of blood chemistry: Secondary | ICD-10-CM | POA: Insufficient documentation

## 2024-06-02 DIAGNOSIS — K219 Gastro-esophageal reflux disease without esophagitis: Secondary | ICD-10-CM | POA: Diagnosis not present

## 2024-06-02 DIAGNOSIS — Z09 Encounter for follow-up examination after completed treatment for conditions other than malignant neoplasm: Secondary | ICD-10-CM | POA: Diagnosis not present

## 2024-06-02 DIAGNOSIS — N400 Enlarged prostate without lower urinary tract symptoms: Secondary | ICD-10-CM | POA: Diagnosis not present

## 2024-06-02 DIAGNOSIS — I959 Hypotension, unspecified: Secondary | ICD-10-CM

## 2024-06-02 DIAGNOSIS — E871 Hypo-osmolality and hyponatremia: Secondary | ICD-10-CM | POA: Diagnosis not present

## 2024-06-02 LAB — CBC
HCT: 28.8 % — ABNORMAL LOW (ref 39.0–52.0)
Hemoglobin: 9.6 g/dL — ABNORMAL LOW (ref 13.0–17.0)
MCHC: 33.2 g/dL (ref 30.0–36.0)
MCV: 84.9 fl (ref 78.0–100.0)
Platelets: 685 K/uL — ABNORMAL HIGH (ref 150.0–400.0)
RBC: 3.39 Mil/uL — ABNORMAL LOW (ref 4.22–5.81)
RDW: 15.8 % — ABNORMAL HIGH (ref 11.5–15.5)
WBC: 12 K/uL — ABNORMAL HIGH (ref 4.0–10.5)

## 2024-06-02 LAB — BASIC METABOLIC PANEL WITH GFR
BUN: 18 mg/dL (ref 6–23)
CO2: 27 meq/L (ref 19–32)
Calcium: 8.8 mg/dL (ref 8.4–10.5)
Chloride: 93 meq/L — ABNORMAL LOW (ref 96–112)
Creatinine, Ser: 1.27 mg/dL (ref 0.40–1.50)
GFR: 58.01 mL/min — ABNORMAL LOW (ref >60.00)
Glucose, Bld: 205 mg/dL — ABNORMAL HIGH (ref 70–99)
Potassium: 5.2 meq/L — ABNORMAL HIGH (ref 3.5–5.1)
Sodium: 130 meq/L — ABNORMAL LOW (ref 135–145)

## 2024-06-02 LAB — T3, FREE: T3, Free: 2.6 pg/mL (ref 2.3–4.2)

## 2024-06-02 LAB — T4, FREE: Free T4: 1.24 ng/dL (ref 0.60–1.60)

## 2024-06-02 LAB — PSA: PSA: 1.51 ng/mL (ref 0.10–4.00)

## 2024-06-02 LAB — TSH: TSH: 1.39 u[IU]/mL (ref 0.35–5.50)

## 2024-06-02 MED ORDER — PANTOPRAZOLE SODIUM 40 MG PO TBEC: 40.0000 mg | DELAYED_RELEASE_TABLET | Freq: Every day | ORAL | 1 refills | Status: AC

## 2024-06-02 NOTE — Assessment & Plan Note (Signed)
Psa pending?

## 2024-06-02 NOTE — Assessment & Plan Note (Addendum)
 TSH, t3, T4 pending. Reviewed previous thyroid studies.  Was started on Levothyroxine 100 mcg at the hospital.  Will recheck.

## 2024-06-02 NOTE — Patient Instructions (Addendum)
 Stop by the lab prior to leaving today. I will notify you of your results once received.   Continue Pantoprazole 40 mg once daily. Refill sent.   As discussed, monitor BP at home. Go to the ER if consistently below 90/60.  Follow up with podiatry as scheduled.  F/u with Dr. Cleatus.   It was a pleasure to see you today!

## 2024-06-02 NOTE — Progress Notes (Signed)
 Established Patient Office Visit  Subjective   Patient ID: [name], male    DOB: Nov 23, 1955  Age: 68 y.o. MRN: [id number]  Chief Complaint  Patient presents with   Gastroesophageal Reflux    4 week f/u    Hospitalization Follow-up    Right foot wound f/u; needs labs done.     Gastroesophageal Reflux He reports no abdominal pain, no chest pain, no heartburn or no nausea.    [name] is a 68 year old male, patient of Bradley Denise, MD, presents today for a hospital f/u.   Discussed the use of AI scribe software for clinical note transcription with the patient, who gave verbal consent to proceed.  History of Present Illness [name] is a 68 year old male with diabetic foot wounds who presents for follow-up after hospitalization for a right foot infection. He is accompanied by his sister-in-law.  He was hospitalized from October 29 to May 27, 2024, due to a diabetic foot wound on the right foot, accompanied by fever, redness, and swelling. During his hospital stay, he was treated with IV antibiotics, vancomycin  and Zosyn , and received fluids for hyponatremia. An MRI of his right foot without contrast revealed fluid collections suspicious for abscess, encephalitis, distal ulceration along the plantar stump margin, and trace endosteal edema along the distal margins of the second and third metatarsals. He was discharged on Duricef 500 mg twice daily and capecitabine 500 mg twice daily for ten days.  He continued to experience hyponatremia during his hospitalization, with sodium levels fluctuating between 126 and 130 mEq/L. His CBC on May 23, 2024, showed [name] of 10.9.  He is also following up for GERD, for which he was started on pantoprazole 40 mg once a day on May 02, 2024.  He reports no more fevers but still does not feel well, noting that he 'felt better' before. He is experiencing low blood pressure and is not eating much, although his  sister-in-law tries to encourage him to eat.    Patient Active Problem List   Diagnosis Date Noted   Hospital discharge follow-up 06/02/2024   Hyponatremia 06/02/2024   Abnormal TSH 06/02/2024   Diabetic foot infection (HCC) 05/21/2024   Iron deficiency anemia 05/07/2024   Other constipation 05/02/2024   Amputated toe of left foot 03/05/2024   PAD (peripheral artery disease) 07/10/2023   Cold hands 06/06/2023   Diabetes mellitus treated with insulin  and oral medication (HCC) 06/06/2023   Alcohol  use 05/05/2023   Carpal tunnel syndrome of left wrist 01/05/2023   Carpal tunnel syndrome of right wrist 01/01/2023   Numbness of hand 01/01/2023   BPH (benign prostatic hyperplasia) 12/20/2022   B12 deficiency 10/31/2022   Allergic reaction to sulfonamide 06/19/2022   Symptomatic anemia 06/12/2022   Elevated PSA 02/14/2019   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 (HCC) 03/12/2018   Diabetic foot ulcer with osteomyelitis (HCC) 10/29/2015   Adenomatous polyp    Routine general medical examination at a health care facility 04/17/2011   Osteoarthritis 03/17/2007   DM (diabetes mellitus) type II controlled, neurological manifestation (HCC) 03/13/2007   Hyperlipemia 03/13/2007   GOUT 03/13/2007   Essential hypertension, benign 03/13/2007   GERD 03/13/2007   PSORIASIS 03/13/2007   Past Medical History:  Diagnosis Date   Adenomatous polyp    Allergy     Anemia    Arthritis    feet    Bunion 04/02/2013   STATUS POST OP  BUN REPAIR   Diabetes mellitus    Foot ulcer (HCC)    GERD (gastroesophageal reflux disease)    Gout    Hammertoe    Hyperlipidemia    Hypertension    Neuromuscular disorder (HCC)    neuropathy   Plantar flexed metatarsal    Psoriasis    Schatzki's ring    Past Surgical History:  Procedure Laterality Date   ACHILLES TENDON LENGTHENING Right 05/23/2024   Procedure: LENGTHENING, TENDON, ACHILLES;  Surgeon: Ashley Soulier, DPM;  Location:  [facility];  Service: Orthopedics/Podiatry;  Laterality: Right;   AMPUTATION TOE Left 05/08/2023   Procedure: AMPUTATION 5TH TOE WITH PARTIAL RAY RESECTION;  Surgeon: Neill Boas, DPM;  Location: [facility];  Service: Orthopedics/Podiatry;  Laterality: Left;   CARPAL TUNNEL RELEASE Left 08/24/2022   Procedure: CARPAL TUNNEL RELEASE;  Surgeon: Cleotilde Barrio, MD;  Location: [facility];  Service: Orthopedics;  Laterality: Left;   CATARACT EXTRACTION W/PHACO Left 03/21/2023   Procedure: CATARACT EXTRACTION PHACO AND INTRAOCULAR LENS PLACEMENT (IOC) LEFT MALYUGIN DIABETIC 9.88 1:01.4;  Surgeon: Mittie Gaskin, MD;  Location: [facility];  Service: Ophthalmology;  Laterality: Left;   CATARACT EXTRACTION W/PHACO Right 04/04/2023   Procedure: CATARACT EXTRACTION PHACO AND INTRAOCULAR LENS PLACEMENT (IOC) RIGHT MALYUGIN DIABETIC 15.31 01:15.1;  Surgeon: Mittie Gaskin, MD;  Location: [facility];  Service: Ophthalmology;  Laterality: Right;   COLONOSCOPY     EYE SURGERY     Cataracts   FOOT AMPUTATION Right 01/11/2017   Dr Tisa ([facility])--transmetatarsal   FOOT SURGERY Right 02/28/2013   ALLEEN HAM TOE2,3 PINS ,2ND MET OSTEOTOMY, 5TH MET W/SCREW   02-2013, 04-2013   HIP FRACTURE SURGERY  1993   surgery x2   IR BONE MARROW BIOPSY & ASPIRATION  04/27/2023   IRRIGATION AND DEBRIDEMENT FOOT Right 05/23/2024   Procedure: IRRIGATION AND DEBRIDEMENT FOOT;  Surgeon: Ashley Soulier, DPM;  Location: [facility];  Service: Orthopedics/Podiatry;  Laterality: Right;   JOINT REPLACEMENT     LOWER EXTREMITY ANGIOGRAPHY Left 05/07/2023   Procedure: Lower Extremity Angiography;  Surgeon: Marea Selinda RAMAN, MD;  Location: [facility];  Service: Cardiovascular;  Laterality: Left;   NASAL SEPTOPLASTY W/ TURBINOPLASTY Bilateral 02/08/2023   Procedure: NASAL SEPTOPLASTY WITH TURBINATE REDUCTION;  Surgeon: Edda Mt, MD;  Location: [facility];  Service: ENT;  Laterality: Bilateral;    POLYPECTOMY     PROSTATE BIOPSY  10/10/2019   TOTAL HIP ARTHROPLASTY     Left hip replacement/femur fx 07/04, Screw removal left hip- [facility] 11/01   Allergies  Allergen Reactions   Glipizide Other (See Comments)    REACTION: wt. gain, hands tingling   Lisinopril  Other (See Comments)    HYPERKALEMIA   Ramipril Cough   Ibuprofen     UNSPECIFIED REACTION    Bactrim  [Sulfamethoxazole -Trimethoprim ] Nausea And Vomiting and Rash         06/02/2024   10:36 AM 05/02/2024   12:15 PM 04/29/2024    9:52 AM  Depression screen PHQ 2/9  Decreased Interest 2 3 0  Down, Depressed, Hopeless 1 0 0  PHQ - 2 Score 3 3 0  Altered sleeping 3 0   Tired, decreased energy 1 3   Change in appetite 0 1   Feeling bad or failure about yourself  0 0   Trouble concentrating 0 0   Moving slowly or fidgety/restless 0 0   Suicidal thoughts 0 0   PHQ-9 Score 7 7    Difficult doing work/chores Somewhat  difficult Very difficult      Data saved with a previous flowsheet row definition       06/02/2024   10:36 AM  GAD 7 : Generalized Anxiety Score  Nervous, Anxious, on Edge 1  Control/stop worrying 0  Worry too much - different things 0  Trouble relaxing 2  Restless 1  Easily annoyed or irritable 2  Afraid - awful might happen 0  Total GAD 7 Score 6  Anxiety Difficulty Somewhat difficult      Review of Systems  Constitutional:  Negative for chills and fever.  Respiratory:  Negative for shortness of breath.   Cardiovascular:  Negative for chest pain.  Gastrointestinal:  Negative for abdominal pain, constipation, diarrhea, heartburn, nausea and vomiting.  Genitourinary:  Negative for dysuria, frequency and urgency.  Neurological:  Negative for dizziness and headaches.  Endo/Heme/Allergies:  Negative for polydipsia.  Psychiatric/Behavioral:  Negative for depression and suicidal ideas. The patient is not nervous/anxious.       Objective:     BP (!) 104/58   Pulse 94   Temp 98.3 F (36.8 C)  (Temporal)   Ht 6' (1.829 m)   SpO2 96%   BMI 29.84 kg/m  BP Readings from Last 3 Encounters:  06/02/24 (!) 104/58  05/27/24 131/68  05/15/24 121/62   Wt Readings from Last 3 Encounters:  05/23/24 220 lb (99.8 kg)  05/02/24 236 lb (107 kg)  04/29/24 232 lb (105.2 kg)      Physical Exam Vitals and nursing note reviewed.  Constitutional:      Appearance: Normal appearance.  Cardiovascular:     Rate and Rhythm: Normal rate and regular rhythm.     Pulses: Normal pulses.     Heart sounds: Normal heart sounds.  Pulmonary:     Effort: Pulmonary effort is normal.     Breath sounds: Normal breath sounds.  Neurological:     Mental Status: He is alert and oriented to person, place, and time.  Psychiatric:        Mood and Affect: Mood normal.        Behavior: Behavior normal.        Thought Content: Thought content normal.        Judgment: Judgment normal.      No results found for any visits on 06/02/24.     The ASCVD Risk score (Arnett DK, et al., 2019) failed to calculate for the following reasons:   The valid total cholesterol range is 130 to 320 mg/dL    Assessment & Plan:  Hospital discharge follow-up Assessment & Plan: Reviewed hospital notes, labs, imaging.   Gastroesophageal reflux disease, unspecified whether esophagitis present -     Pantoprazole Sodium; Take 1 tablet (40 mg total) by mouth daily.  Dispense: 90 tablet; Refill: 1  Diabetic foot infection (HCC) -     CBC  Hyponatremia -     CBC -     Basic metabolic panel with GFR  Abnormal TSH Assessment & Plan: TSH, t3, T4 pending. Reviewed previous thyroid studies.  Was started on Levothyroxine 100 mcg at the hospital.  Will recheck.  Orders: -     T4, free -     TSH -     T3, free  Elevated PSA Assessment & Plan: Psa pending.  Orders: -     PSA  Benign prostatic hyperplasia without lower urinary tract symptoms Assessment & Plan: Psa pending.  Orders: -     PSA  Hypotension,  unspecified  hypotension type    Assessment and Plan Assessment & Plan Diabetic foot infection, right foot in the setting of type 2 diabetes mellitus Recent hospitalization for infection with abscess and ulceration. Treated with IV antibiotics, now on oral Duricef and capecitabine. No fever, but pale and hypotensive. Risk of readmission if symptoms worsen. - Continue Duricef 500 mg twice daily. - Continue capecitabine 500 mg twice daily for 10 days. - Follow up with podiatry as scheduled.  Hyponatremia Persistent hyponatremia with sodium levels between 126-130 mEq/L. Discharged with sodium at 128 mEq/L. Risk of dizziness and confusion if sodium remains low. - Rechecked sodium levels today. - Encouraged dietary sodium intake to help normalize levels.  Gastroesophageal reflux disease (GERD) GERD managed with pantoprazole, effective. Omeprazole  discontinued. - Prescribed pantoprazole for 90 days with refills.  Hypotension Low blood pressure noted during hospitalization and currently. Risk of further decrease if not managed. - Monitor blood pressure at home. - Seek ER care if blood pressure consistently drops below 90/60 mmHg. - Consider IV hydration packets to maintain hydration.   Return if symptoms worsen or fail to improve.    Carrol Aurora, NP

## 2024-06-02 NOTE — Assessment & Plan Note (Signed)
Reviewed hospital notes, labs, imaging.

## 2024-06-03 ENCOUNTER — Telehealth: Payer: Self-pay

## 2024-06-03 DIAGNOSIS — L97512 Non-pressure chronic ulcer of other part of right foot with fat layer exposed: Secondary | ICD-10-CM | POA: Diagnosis not present

## 2024-06-03 DIAGNOSIS — M86171 Other acute osteomyelitis, right ankle and foot: Secondary | ICD-10-CM | POA: Diagnosis not present

## 2024-06-03 DIAGNOSIS — E11610 Type 2 diabetes mellitus with diabetic neuropathic arthropathy: Secondary | ICD-10-CM | POA: Diagnosis not present

## 2024-06-03 NOTE — Telephone Encounter (Signed)
 Please give the order.    If he has tolerated amlodipine  and simvastatin  in the past then he can continue as is.  I do not suspect the other interactions will be clinically significant.  Thanks.

## 2024-06-03 NOTE — Telephone Encounter (Signed)
 Will send to Dr Cleatus since pt is set to transfer care to him in February 2026.

## 2024-06-03 NOTE — Telephone Encounter (Signed)
 Copied from CRM #[id number]. Topic: Clinical - Home Health Verbal Orders >> Jun 03, 2024  8:41 AM Adelita E wrote: Caller/Agency: Jatana, PT with [facility] Callback Number: [phone] (secure line) Service Requested: Physical Therapy Frequency: 1x/week for 9 weeks Any new concerns about the patient? Yes, Jatana wanted to report some medication interactions:  Type 1 interaction:  amLODipine  (NORVASC ) 5 MG tablet with simvastatin  (ZOCOR ) 40 MG tablet  Type 2 interaction: ciprofloxacin  (CIPRO ) 500 MG tablet with simvastatin  (ZOCOR ) 40 MG tablet, hydrOXYzine  (VISTARIL ) 25 MG capsule, and DULoxetine  (CYMBALTA ) 60 MG capsule

## 2024-06-03 NOTE — Progress Notes (Addendum)
 Chief Complaint:   Chief Complaint  Patient presents with  . Right Foot - Post Operative Visit    Post op     Subjective  HPI  [name] is a 68 y.o. male who presents for Post Operative Visit of the Right Foot (Post op/) History of Present Illness [name] is a 68 year old male with a history of foot ulcers and abscesses who presents for follow-up of wound healing and infection status.  He has been experiencing weakness and a recent episode of fever over the weekend. No new swelling from the wounds is reported.  He manages wound care by changing bandages when they appear damp to keep fluid off the wounds. The skin around the incision site has turned Nor[place], explained as maceration due to drainage. The wounds are healing, but there is still some drainage present.  He had a previous infection that required a PICC line and antibiotics for three to four weeks. Recent blood work done by his primary care doctor showed concerning results, including the presence of E. Coli. He is currently on oral antibiotics but is running low on his supply.  He had a previous toxic amputation. An MRI did not show bone infection, but there is concern due to his symptoms and past medical history.  He and his family had plans to travel to [place]  to see their grandchildren, but they have postponed the trip until his foot heals.  Review of Systems  Patient Active Problem List  Diagnosis  . Adenomatous polyp  . Allergic reaction  . Diabetic foot infection (CMS/HHS-HCC)  . DM (diabetes mellitus) type II controlled, neurological manifestation (CMS/HHS-HCC)  . Elevated PSA  . Esophageal reflux  . Essential hypertension, benign  . Gout  . Hyperlipidemia, unspecified  . Osteoarthritis  . Psoriasis  . Routine general medical examination at a health care facility  . 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 (CMS/HHS-HCC)  . Type 2 diabetes mellitus, without long-term current use of insulin   (CMS/HHS-HCC)  . Viral upper respiratory infection  . Acute non-recurrent frontal sinusitis  . Alcohol  use  . Allergic reaction to sulfonamide  . Atherosclerosis of native arteries of the extremities with ulceration (CMS/HHS-HCC)  . B12 deficiency  . BPH (benign prostatic hyperplasia)  . Carpal tunnel syndrome of left wrist  . Chronic foot ulcer, left, limited to breakdown of skin (CMS/HHS-HCC)  . Diabetes mellitus treated with insulin  and oral medication (CMS/HHS-HCC)  . Diabetic peripheral neuropathy (CMS/HHS-HCC)  . Left wrist pain  . Cold hands  . Pain in both hands  . Symptomatic anemia  . Ulnar neuropathy of left upper extremity  . Amputated toe of left foot ()  . Iron deficiency anemia  . Other constipation    Outpatient Medications Prior to Visit  Medication Sig Dispense Refill  . alpha lipoic acid 600 mg tablet Take by mouth    . amLODIPine  (NORVASC ) 5 MG tablet Take 5 mg by mouth once daily    . amoxicillin -clavulanate (AUGMENTIN ) 875-125 mg tablet Take 1 tablet by mouth 2 (two) times daily    . benzonatate (TESSALON) 200 MG capsule Take 200 mg by mouth 2 (two) times daily as needed    . calcipotriene  (DOVONEX ) 0.005 % ointment Apply 0.005 % topically 2 (two) times daily    . calcipotriene -betamethasone (ENSTILAR ) 0.005-0.064 % Foam Apply to skin once or twice a day    . CALCIUM-MAGNESIUM -ZINC  ORAL Take by mouth    . celecoxib (CELEBREX) 200 MG capsule  TAKE 1 CAPSULE(200 MG) BY MOUTH TWICE DAILY 60 capsule 1  . cephalexin  (KEFLEX ) 500 MG capsule Take 500 mg by mouth 2 (two) times daily    . cinnamon bark 500 mg capsule Take 500 mg by mouth 2 (two) times daily    . clobetasoL (TEMOVATE) 0.05 % ointment Apply 0.05 % topically 2 (two) times daily    . CONTOUR NEXT TEST STRIPS test strip     . cyanocobalamin  (VITAMIN B12) 1,000 mcg/mL injection INJECT 1 ML IN THE MUSCLE EVERY WEEK FOR 3 WEEKS THEN INJECT 1 ML IN THE MUSCLE ONCE A MONTH    . doxycycline  (VIBRAMYCIN ) 100 MG  capsule Take 100 mg by mouth 2 (two) times daily    . DULoxetine  (CYMBALTA ) 60 MG DR capsule Take 60 mg by mouth 2 (two) times daily    . FARXIGA  5 mg tablet     . finasteride  (PROSCAR ) 5 mg tablet Take 1 tablet by mouth once daily    . flaxseed oiL Oil by Miscellaneous route.    . folic acid  (FOLVITE ) 1 MG tablet Take 1 tablet by mouth once daily    . gabapentin  (NEURONTIN ) 400 MG capsule Take 800 mg by mouth 3 (three) times daily    . HYDROcodone -acetaminophen  (NORCO) 5-325 mg tablet Take 1 tablet by mouth every 4 (four) hours as needed for Pain 20 tablet 0  . HYDROcodone -acetaminophen  (NORCO) 7.5-325 mg tablet Take 1 tablet by mouth every 6 (six) hours as needed for Pain 20 tablet 0  . HYDROcodone -acetaminophen  (NORCO) 7.5-325 mg tablet Take 1 tablet by mouth every 6 (six) hours as needed for Pain 20 tablet 0  . hydrocortisone  valerate (WEST-CORT) 0.2 % ointment     . hydrOXYzine  pamoate (VISTARIL ) 25 MG capsule Take 25 mg by mouth 3 (three) times daily as needed for Anxiety or Itching    . indomethacin  (INDOCIN ) 25 MG capsule Take 25 mg by mouth 2 (two) times daily with meals    . ixekizumab  (TALTZ  AUTOINJECTOR) 80 mg/mL AtIn Inject subcutaneously    . ketoconazole  (NIZORAL ) 2 % shampoo Apply 2 % topically once    . LANTUS  SOLOSTAR U-100 INSULIN  pen injector (concentration 100 units/mL)     . lidocaine  HCL (XYLOCAINE ) 4 % topical solution Apply 1 mL topically as needed 30 mL 0  . losartan  (COZAAR ) 25 MG tablet Take 25 mg by mouth once daily    . meloxicam  (MOBIC ) 15 MG tablet Take 1 tablet by mouth once daily    . metFORMIN  (GLUCOPHAGE ) 1000 MG tablet Take 1 tablet by mouth 2 (two) times daily with meals    . methocarbamoL (ROBAXIN) 500 MG tablet Take 2 tablets by mouth at bedtime    . montelukast (SINGULAIR) 10 mg tablet Take 10 mg by mouth once daily    . nystatin-triamcinolone  cream Apply topically 2 (two) times daily    . omeprazole  (PRILOSEC) 20 MG DR capsule     . oxyCODONE  (OXYIR)  10 mg immediate release tablet     . PREVIDENT 5000 BOOSTER PLUS 1.1 % dental paste Place onto teeth once daily    . simvastatin  (ZOCOR ) 40 MG tablet Take 1 tablet by mouth at bedtime    . syringe with needle (BD LUER-LOK SYRINGE) 3 mL 25 gauge x 1 Syrg USE FOR IN THE MUSCLE INJECTION ONCE A MONTH OR AS DIRECTED    . tamsulosin  (FLOMAX ) 0.4 mg capsule Take 1 capsule by mouth once daily    . tapinarof  (VTAMA ) 1 %  Crea Apply topically    . traMADoL (ULTRAM) 50 mg tablet 1-2 tabs every 4-6 hours as needed for pain 30 tablet 0  . triamcinolone  0.1 % lotion     . zinc  sulfate 50 mg zinc  (220 mg) Tab Take 50 mg by mouth once daily     No facility-administered medications prior to visit.      Objective  Vitals:   06/03/24 1416  BP: 122/68  Pulse: 91  PainSc: 0-No pain   There is no height or weight on file to calculate BMI.  Home Vitals:     Physical Exam MUSCULOSKELETAL: Foot appears healthy with no signs of infection. Achilles stitches ready for removal. SKIN: Maceration around incision site with Boan discoloration due to drainage.  The wound site looks to be healing very nicely.  There is no signs of active purulent drainage.  Erythema has basically completely subsided.  The distal and plantar wound sites are granulating in nicely without any purulent drainage.  X-rays in the office shows the removal of the distal 2nd, 3rd and 4th metatarsals.  No gas in soft tissues.  The antibiotic bead spacer is noted in the soft tissue distally  Preop MRI: IMPRESSION: 1. Dorsal fluid collection in the soft tissues above the remaining 2nd and 3rd metatarsals (4.2 x 1.5 x 1.8 cm) suspicious for abscess, potentially spontaneously draining to the anterior cutaneous surface , with surrounding subcutaneous edema compatible with cellulitis. 2. Distal ulceration along the plantar stump margin, likely accounting for the gas density seen on prior radiography. 3. Trace endosteal edema along the  distal margins of the 2nd and 3rd metatarsals, likely reactive and not specific for osteomyelitis. 4. Tibialis posterior, flexor digitorum longus, and flexor hallucis longus tenosynovitis. 5. Regional muscular atrophy.  Pathology:1. Toe(s), amputation, right foot second and third metatarsal :       ACUTE OSTEOMYELITIS PRESENT IN ONE CUT BONE MARGIN   Intraoperative wound culture: Component 11 d ago  Specimen Description ULCER RIGHT FOOT ULCER Performed at [facility], [address] [place], [place] [address]  Special Requests NONE Performed at [facility], [address] [place], [place] [address]  Gram Stain RARE WBC PRESENT, PREDOMINANTLY PMN NO ORGANISMS SEEN  Culture RARE ENTEROBACTER CLOACAE RARE ESCHERICHIA COLI    The following labs were personally reviewed: -No results found for: WBC, HGB, HCT, PLT -No results found for: NA, K, CL, CO2, BUN, CREATININE, GLUCOSE -No results found for: NA, K, CL, CO2, BUN, CREATININE, CALCIUM, TP, ALB, TBILI, ALKPHOS, AST, ALT, GLUCOSE, GFR -No results found for: HGBA1C    Results RADIOLOGY X-ray: Antibiotic spacer visible MRI: No osteomyelitis  PROCEDURE Date: [Insert Date] Procedure: Suture removal Description: Sutures were removed from the Achilles tendon area.     Assessment/Plan:   Assessment & Plan Non-pressure chronic ulcer of right foot with fat layer exposed Status post transmetatarsal amputation and residual debridement of bone and soft tissue  The ulcer is healing well with no signs of residual infection. Maceration around the incision site is due to drainage, expected to resolve as drainage decreases. Antibiotic spacers are in place, with some expelled but still present. Clinically, the foot shows no signs of infection, and the likelihood of residual infection is extremely low. - Continue dressing changes to manage drainage and prevent  maceration. - Apply Betadine to keep the area clean. - Removed stitches from the Achilles area. - Referred to infectious disease specialist for further evaluation and management. - Continue oral antibiotics until  infectious disease consultation.  Osteomyelitis: This is a new diagnosis I was surprised with the pathology showing possible residual osteomyelitis in the proximal margin.  MRI preoperatively only showed mild bony edema not consistent with osteomyelitis.  We will get the opinion of infectious disease.  I am going to restart his Cipro  and Augmentin .  He was complaining of weakness and he can follow-up with his primary care provider as they performed a battery of tests on him recently per the patient.  I will see him back early next week.  Diagnoses and all orders for this visit:  Ulcer of right foot with fat layer exposed (CMS/HHS-HCC) -     X-ray foot right 3 plus views; Future  Type 2 diabetes mellitus with Charcot's joint arthropathy (CMS/HHS-HCC)  Other acute osteomyelitis of right foot (CMS/HHS-HCC)          Future Appointments   This patient does not currently have any appointments scheduled.     There are no Patient Instructions on file for this visit.   This note has been created using automated tools and reviewed for accuracy by JUSTIN ALLEN FOWLER.

## 2024-06-03 NOTE — Transitions of Care (Post Inpatient/ED Visit) (Signed)
   06/03/2024  Name: [name] MRN: [id number] DOB: 10-07-1955  Today's TOC FU Call Status: Today's TOC FU Call Status:: Unsuccessful Call (3rd Attempt) Unsuccessful Call (3rd Attempt) Date: 06/03/24  Attempted to reach the patient regarding the most recent Inpatient/ED visit.  Follow Up Plan: No further outreach attempts will be made at this time. We have been unable to contact the patient.  Arvin Seip RN, BSN, CCM [employer], Population Health Case Manager Phone: [phone]

## 2024-06-04 ENCOUNTER — Telehealth: Payer: Self-pay | Admitting: Internal Medicine

## 2024-06-04 ENCOUNTER — Other Ambulatory Visit: Payer: Self-pay

## 2024-06-04 DIAGNOSIS — D509 Iron deficiency anemia, unspecified: Secondary | ICD-10-CM

## 2024-06-04 NOTE — Telephone Encounter (Signed)
 Pt wife calling and states the husband had surgery on his foot and she would like to make an appt with Dr. KATHEE or Tinnie dawn. She also states that the pt was approved for 2 iron infusions and she thinks he needs an iron infusion as well. She states the pt has been very weak, sleeps a lot and doesn't feel well. Please advise.

## 2024-06-04 NOTE — Telephone Encounter (Signed)
 [name] called in to follow up on her HH orders. Apologized and stated she didn't know her vm was full, I relayed Dr Cleatus message. If needed, call back at [phone]/secure line

## 2024-06-04 NOTE — Telephone Encounter (Signed)
 2nd attempt: Does not ring. Goes straight to VM and VM is full. Unable to leave message.

## 2024-06-04 NOTE — Telephone Encounter (Signed)
 I tried to call and leave the verbal orders but the VM was full.

## 2024-06-05 ENCOUNTER — Inpatient Hospital Stay: Admitting: Nurse Practitioner

## 2024-06-05 ENCOUNTER — Encounter: Payer: Self-pay | Admitting: Nurse Practitioner

## 2024-06-05 ENCOUNTER — Inpatient Hospital Stay: Attending: Nurse Practitioner

## 2024-06-05 ENCOUNTER — Inpatient Hospital Stay

## 2024-06-05 ENCOUNTER — Telehealth: Payer: Self-pay | Admitting: *Deleted

## 2024-06-05 VITALS — BP 97/54 | HR 99 | Temp 97.8°F | Resp 16 | Wt 211.9 lb

## 2024-06-05 VITALS — BP 114/63 | HR 86

## 2024-06-05 DIAGNOSIS — D75839 Thrombocytosis, unspecified: Secondary | ICD-10-CM | POA: Insufficient documentation

## 2024-06-05 DIAGNOSIS — Z833 Family history of diabetes mellitus: Secondary | ICD-10-CM | POA: Insufficient documentation

## 2024-06-05 DIAGNOSIS — R5383 Other fatigue: Secondary | ICD-10-CM | POA: Diagnosis not present

## 2024-06-05 DIAGNOSIS — Z79899 Other long term (current) drug therapy: Secondary | ICD-10-CM | POA: Diagnosis not present

## 2024-06-05 DIAGNOSIS — Z882 Allergy status to sulfonamides status: Secondary | ICD-10-CM | POA: Insufficient documentation

## 2024-06-05 DIAGNOSIS — D509 Iron deficiency anemia, unspecified: Secondary | ICD-10-CM

## 2024-06-05 DIAGNOSIS — Z9841 Cataract extraction status, right eye: Secondary | ICD-10-CM | POA: Diagnosis not present

## 2024-06-05 DIAGNOSIS — D649 Anemia, unspecified: Secondary | ICD-10-CM | POA: Insufficient documentation

## 2024-06-05 DIAGNOSIS — D638 Anemia in other chronic diseases classified elsewhere: Secondary | ICD-10-CM | POA: Insufficient documentation

## 2024-06-05 DIAGNOSIS — E1169 Type 2 diabetes mellitus with other specified complication: Secondary | ICD-10-CM | POA: Diagnosis not present

## 2024-06-05 DIAGNOSIS — E875 Hyperkalemia: Secondary | ICD-10-CM

## 2024-06-05 DIAGNOSIS — Z79890 Hormone replacement therapy (postmenopausal): Secondary | ICD-10-CM | POA: Insufficient documentation

## 2024-06-05 DIAGNOSIS — M869 Osteomyelitis, unspecified: Secondary | ICD-10-CM | POA: Diagnosis not present

## 2024-06-05 DIAGNOSIS — R12 Heartburn: Secondary | ICD-10-CM | POA: Diagnosis not present

## 2024-06-05 DIAGNOSIS — Z886 Allergy status to analgesic agent status: Secondary | ICD-10-CM | POA: Insufficient documentation

## 2024-06-05 DIAGNOSIS — I959 Hypotension, unspecified: Secondary | ICD-10-CM | POA: Diagnosis not present

## 2024-06-05 DIAGNOSIS — Z860101 Personal history of adenomatous and serrated colon polyps: Secondary | ICD-10-CM | POA: Diagnosis not present

## 2024-06-05 DIAGNOSIS — Z9842 Cataract extraction status, left eye: Secondary | ICD-10-CM | POA: Diagnosis not present

## 2024-06-05 DIAGNOSIS — Z89422 Acquired absence of other left toe(s): Secondary | ICD-10-CM | POA: Diagnosis not present

## 2024-06-05 DIAGNOSIS — I10 Essential (primary) hypertension: Secondary | ICD-10-CM | POA: Diagnosis not present

## 2024-06-05 DIAGNOSIS — Z818 Family history of other mental and behavioral disorders: Secondary | ICD-10-CM | POA: Insufficient documentation

## 2024-06-05 DIAGNOSIS — R5382 Chronic fatigue, unspecified: Secondary | ICD-10-CM | POA: Insufficient documentation

## 2024-06-05 DIAGNOSIS — L97519 Non-pressure chronic ulcer of other part of right foot with unspecified severity: Secondary | ICD-10-CM | POA: Diagnosis not present

## 2024-06-05 DIAGNOSIS — Z888 Allergy status to other drugs, medicaments and biological substances status: Secondary | ICD-10-CM | POA: Insufficient documentation

## 2024-06-05 LAB — CBC WITH DIFFERENTIAL (CANCER CENTER ONLY)
Abs Immature Granulocytes: 0.02 K/uL (ref 0.00–0.07)
Basophils Absolute: 0.1 K/uL (ref 0.0–0.1)
Basophils Relative: 1 %
Eosinophils Absolute: 0.5 K/uL (ref 0.0–0.5)
Eosinophils Relative: 7 %
HCT: 28.5 % — ABNORMAL LOW (ref 39.0–52.0)
Hemoglobin: 9.3 g/dL — ABNORMAL LOW (ref 13.0–17.0)
Immature Granulocytes: 0 %
Lymphocytes Relative: 18 %
Lymphs Abs: 1.4 K/uL (ref 0.7–4.0)
MCH: 28.1 pg (ref 26.0–34.0)
MCHC: 32.6 g/dL (ref 30.0–36.0)
MCV: 86.1 fL (ref 80.0–100.0)
Monocytes Absolute: 0.6 K/uL (ref 0.1–1.0)
Monocytes Relative: 8 %
Neutro Abs: 5 K/uL (ref 1.7–7.7)
Neutrophils Relative %: 66 %
Platelet Count: 568 K/uL — ABNORMAL HIGH (ref 150–400)
RBC: 3.31 MIL/uL — ABNORMAL LOW (ref 4.22–5.81)
RDW: 14.6 % (ref 11.5–15.5)
WBC Count: 7.6 K/uL (ref 4.0–10.5)
nRBC: 0 % (ref 0.0–0.2)

## 2024-06-05 LAB — IRON AND TIBC
Iron: 31 ug/dL — ABNORMAL LOW (ref 45–182)
Saturation Ratios: 12 % — ABNORMAL LOW (ref 17.9–39.5)
TIBC: 260 ug/dL (ref 250–450)
UIBC: 230 ug/dL

## 2024-06-05 LAB — FERRITIN: Ferritin: 295 ng/mL (ref 24–336)

## 2024-06-05 MED ORDER — IRON SUCROSE 20 MG/ML IV SOLN
200.0000 mg | Freq: Once | INTRAVENOUS | Status: AC
  Administered 2024-06-05: 200 mg via INTRAVENOUS
  Filled 2024-06-05: qty 10

## 2024-06-05 NOTE — Progress Notes (Signed)
 [facility] at [facility] A Department of the [facility]. [facility] [address] [place], [place] [address] [phone] (phone) [phone] (fax)      Patient Care Team: Jimmy Charlie FERNS, MD as PCP - General Rennie Cindy SAUNDERS, MD as Consulting Physician (Oncology)   Name of the patient: [name]  [id number]  Jul 24, 1956   Date of visit: 06/05/24  Diagnosis- Anemia  Chief complaint/ Reason for visit- Fatigue and anemia  Heme/Onc history:  Oncology History   No history exists.    Interval history- patient is 68 year old male who underwent a second recurrence due to osteomyelitis of his right foot.  He presents today with sister-in-law and care.  He is following up with podiatry frequently PCP.  He reports continued chronic fatigue and he does remain anemic.  He did have a colonoscopy in 2023 which did not show any findings of blood loss through the GI tract.  Denies any blood in stools, no dark tarry stools, no blood seen in urine or sputum.  His foot is no longer draining and bleeding is.  Appears to be healing well per podiatry note.  I do believe his fatigue is multifactorial.  He does have some hypotension here in clinic and it appears that he has been trending with hypotension.  This needs to be discussed with his primary care doctor.  We do plan to proceed with IV iron today for total of 1000 mg over the next 5 weeks.  If his hemoglobin continues to trend down he may need to go back to GI for a capsule study.  He did have some mild thrombocytosis today.  It looks like he has had a history of this and it is improved today.  Likely refractory but we will continue to closely monitor.  He did just have 2nd and 3rd metatarsal amputation of her right foot secondary to ulcer and osteomyelitis by podiatry on October 31.  His hemoglobin did dip at that time appears to be improved since then.  I suspect that his iron stores are still  low.  We will proceed with Venofer today.  I did review recent lab work done at PCP visit on the 10th of this month.  He had some mild hyperkalemia at 5.2.  Asymptomatic.  We will repeat labs next week to ensure stability.  Review of systems- Review of Systems  Constitutional:  Positive for malaise/fatigue. Negative for chills, diaphoresis, fever and weight loss.  Respiratory:  Negative for hemoptysis.   Cardiovascular:  Negative for palpitations.  Gastrointestinal:  Positive for heartburn. Negative for abdominal pain, blood in stool, constipation, diarrhea, melena, nausea and vomiting.  Genitourinary:  Negative for dysuria and hematuria.  Neurological:  Negative for dizziness and headaches.      Allergies  Allergen Reactions   Glipizide Other (See Comments)    REACTION: wt. gain, hands tingling   Lisinopril  Other (See Comments)    HYPERKALEMIA   Ramipril Cough   Ibuprofen     UNSPECIFIED REACTION    Bactrim  [Sulfamethoxazole -Trimethoprim ] Nausea And Vomiting and Rash    Past Medical History:  Diagnosis Date   Adenomatous polyp    Allergy     Anemia    Arthritis    feet    Bunion 04/02/2013   STATUS POST OP BUN REPAIR   Diabetes mellitus    Foot ulcer (HCC)    GERD (gastroesophageal reflux disease)    Gout    Hammertoe  Hyperlipidemia    Hypertension    Neuromuscular disorder (HCC)    neuropathy   Plantar flexed metatarsal    Psoriasis    Schatzki's ring     Past Surgical History:  Procedure Laterality Date   ACHILLES TENDON LENGTHENING Right 05/23/2024   Procedure: LENGTHENING, TENDON, ACHILLES;  Surgeon: Ashley Soulier, DPM;  Location: [facility] ORS;  Service: Orthopedics/Podiatry;  Laterality: Right;   AMPUTATION TOE Left 05/08/2023   Procedure: AMPUTATION 5TH TOE WITH PARTIAL RAY RESECTION;  Surgeon: Neill Boas, DPM;  Location: [facility];  Service: Orthopedics/Podiatry;  Laterality: Left;   CARPAL TUNNEL RELEASE Left 08/24/2022   Procedure: CARPAL TUNNEL RELEASE;   Surgeon: Cleotilde Barrio, MD;  Location: [facility] ORS;  Service: Orthopedics;  Laterality: Left;   CATARACT EXTRACTION W/PHACO Left 03/21/2023   Procedure: CATARACT EXTRACTION PHACO AND INTRAOCULAR LENS PLACEMENT (IOC) LEFT MALYUGIN DIABETIC 9.88 1:01.4;  Surgeon: Mittie Gaskin, MD;  Location: [facility];  Service: Ophthalmology;  Laterality: Left;   CATARACT EXTRACTION W/PHACO Right 04/04/2023   Procedure: CATARACT EXTRACTION PHACO AND INTRAOCULAR LENS PLACEMENT (IOC) RIGHT MALYUGIN DIABETIC 15.31 01:15.1;  Surgeon: Mittie Gaskin, MD;  Location: [facility];  Service: Ophthalmology;  Laterality: Right;   COLONOSCOPY     EYE SURGERY     Cataracts   FOOT AMPUTATION Right 01/11/2017   Dr Tisa ([facility])--transmetatarsal   FOOT SURGERY Right 02/28/2013   ALLEEN HAM TOE2,3 PINS ,2ND MET OSTEOTOMY, 5TH MET W/SCREW   02-2013, 04-2013   HIP FRACTURE SURGERY  1993   surgery x2   IR BONE MARROW BIOPSY & ASPIRATION  04/27/2023   IRRIGATION AND DEBRIDEMENT FOOT Right 05/23/2024   Procedure: IRRIGATION AND DEBRIDEMENT FOOT;  Surgeon: Ashley Soulier, DPM;  Location: [facility];  Service: Orthopedics/Podiatry;  Laterality: Right;   JOINT REPLACEMENT     LOWER EXTREMITY ANGIOGRAPHY Left 05/07/2023   Procedure: Lower Extremity Angiography;  Surgeon: Marea Selinda RAMAN, MD;  Location: [facility] INVAS[facility];  Service: Cardiovascular;  Laterality: Left;   NASAL SEPTOPLASTY W/ TURBINOPLASTY Bilateral 02/08/2023   Procedure: NASAL SEPTOPLASTY WITH TURBINATE REDUCTION;  Surgeon: Edda Mt, MD;  Location: [facility];  Service: ENT;  Laterality: Bilateral;   POLYPECTOMY     PROSTATE BIOPSY  10/10/2019   TOTAL HIP ARTHROPLASTY     Left hip replacement/femur fx 07/04, Screw removal left hip- [facility] 11/01    Social History   Socioeconomic History   Marital status: Married    Spouse name: Not on file   Number of children: 3   Years of education: Not on file   Highest education  level: 12th grade  Occupational History   Occupation: PC ADMINISTRATOR    Employer: [employer]    Comment: Retired 3/25  Tobacco Use   Smoking status: Never    Passive exposure: Past   Smokeless tobacco: Never  Vaping Use   Vaping status: Never Used  Substance and Sexual Activity   Alcohol  use: Yes    Alcohol /week: 1.0 standard drink of alcohol     Types: 1 Standard drinks or equivalent per week    Comment: occasional (DWI 1991)   Drug use: No   Sexual activity: Yes    Birth control/protection: None  Other Topics Concern   Not on file  Social History Narrative   alcohol  2-3 times a week; never smoked; lives in [place]. With wife; and 2 dog.        No living will   Wife would be health care POA---no clear alternate  Would accept resuscitation   No feeding tube if cognitively unaware   Social Drivers of Health   Financial Resource Strain: Low Risk  (05/13/2024)   Received from [facility]   Overall Financial Resource Strain (CARDIA)    Difficulty of Paying Living Expenses: Not hard at all  Food Insecurity: No Food Insecurity (05/21/2024)   Hunger Vital Sign    Worried About Running Out of Food in the Last Year: Never true    Ran Out of Food in the Last Year: Never true  Transportation Needs: No Transportation Needs (05/21/2024)   PRAPARE - Administrator, Civil Service (Medical): No    Lack of Transportation (Non-Medical): No  Physical Activity: Inactive (03/03/2024)   Exercise Vital Sign    Days of Exercise per Week: 0 days    Minutes of Exercise per Session: Not on file  Stress: No Stress Concern Present (03/03/2024)   [employer] of Occupational Health - Occupational Stress Questionnaire    Feeling of Stress: Only a little  Social Connections: Unknown (05/21/2024)   Social Connection and Isolation Panel    Frequency of Communication with Friends and Family: Twice a week    Frequency of Social Gatherings with Friends and Family:  Patient declined    Attends Religious Services: More than 4 times per year    Active Member of [employer] or Organizations: No    Attends Engineer, Structural: Not on file    Marital Status: Married  Catering Manager Violence: Not At Risk (05/21/2024)   Humiliation, Afraid, Rape, and Kick questionnaire    Fear of Current or Ex-Partner: No    Emotionally Abused: No    Physically Abused: No    Sexually Abused: No    Family History  Problem Relation Age of Onset   Diabetes Mother    Early death Father    Diabetes Brother    Alzheimer's disease Maternal Aunt    Alzheimer's disease Cousin    Cancer Neg Hx    Colon cancer Neg Hx    Colon polyps Neg Hx    Rectal cancer Neg Hx    Stomach cancer Neg Hx    Esophageal cancer Neg Hx      Current Outpatient Medications:    Alcohol  Swabs  (ALCOHOL  PREP) 70 % PADS, USE 1 PREP PAD TWICE DAILY, Disp: 200 each, Rfl: 3   Alpha-Lipoic Acid 600 MG TABS, Take by mouth. 3 in am and 1 in pm, Disp: , Rfl:    amLODipine  (NORVASC ) 5 MG tablet, TAKE 1 TABLET(5 MG) BY MOUTH DAILY, Disp: 90 tablet, Rfl: 3   CALCIUM-MAGNESIUM -ZINC  PO, Take by mouth., Disp: , Rfl:    cefadroxil (DURICEF) 500 MG capsule, Take 500 mg by mouth., Disp: , Rfl:    Cinnamon 500 MG capsule, Take 500 mg by mouth 2 (two) times daily., Disp: , Rfl:    ciprofloxacin  (CIPRO ) 500 MG tablet, Take 500 mg by mouth., Disp: , Rfl:    clobetasol cream (TEMOVATE) 0.05 %, Apply 1 Application topically 2 (two) times daily., Disp: , Rfl:    CONTOUR NEXT TEST test strip, USE 1 TO 2 TIMES DAILY TO CHECK  BLOOD SUGAR, Disp: 200 strip, Rfl: 3   cyanocobalamin  (VITAMIN B12) 1000 MCG/ML injection, 1 ml injection-once a month., Disp: 12 mL, Rfl: 1   dapagliflozin  propanediol (FARXIGA ) 5 MG TABS tablet, Take 1 tablet (5 mg total) by mouth daily., Disp: 90 tablet, Rfl: 3   DULoxetine  (CYMBALTA ) 60 MG  capsule, Take 2 capsules (120 mg total) by mouth daily., Disp: 180 capsule, Rfl: 3   finasteride   (PROSCAR ) 5 MG tablet, Take 1 tablet (5 mg total) by mouth daily., Disp: 90 tablet, Rfl: 0   furosemide  (LASIX ) 20 MG tablet, Take 1 tablet (20 mg total) by mouth daily as needed., Disp: 90 tablet, Rfl: 0   gabapentin  (NEURONTIN ) 400 MG capsule, Take 2 capsules (800 mg total) by mouth 3 (three) times daily., Disp: 540 capsule, Rfl: 3   hydrocortisone  valerate ointment (WEST-CORT) 0.2 %, APPLY TOPICALLY TO AFFECTED  AREA(S) TWICE DAILY, Disp: 120 g, Rfl: 1   hydrOXYzine  (VISTARIL ) 25 MG capsule, TAKE 1 CAPSULE(25 MG) BY MOUTH EVERY 8 HOURS AS NEEDED FOR ITCHING, Disp: 60 capsule, Rfl: 3   insulin  glargine (LANTUS  SOLOSTAR) 100 UNIT/ML Solostar Pen, Inject 24 Units into the skin daily., Disp: 1 mL, Rfl: 3   Insulin  Pen Needle (B-D UF III MINI PEN NEEDLES) 31G X 5 MM MISC, USE AS DIRECTED DAILY, Disp: 90 each, Rfl: 3   levothyroxine (SYNTHROID) 100 MCG tablet, Take 1 tablet (100 mcg total) by mouth daily at 6 (six) AM., Disp: 30 tablet, Rfl: 0   losartan  (COZAAR ) 25 MG tablet, TAKE 1 TABLET(25 MG) BY MOUTH DAILY, Disp: 90 tablet, Rfl: 3   metFORMIN  (GLUCOPHAGE ) 1000 MG tablet, Take 1 tablet (1,000 mg total) by mouth 2 (two) times daily with a meal., Disp: 180 tablet, Rfl: 3   montelukast (SINGULAIR) 10 MG tablet, Take 10 mg by mouth daily., Disp: , Rfl:    Multiple Vitamin (MULTIVITAMIN WITH MINERALS) TABS tablet, Take 1 tablet by mouth daily., Disp: 30 tablet, Rfl: 2   Oxycodone  HCl 10 MG TABS, Take 1 tablet by mouth 5 (five) times daily., Disp: , Rfl:    pantoprazole (PROTONIX) 40 MG tablet, Take 1 tablet (40 mg total) by mouth daily., Disp: 90 tablet, Rfl: 1   simvastatin  (ZOCOR ) 40 MG tablet, Take 1 tablet (40 mg total) by mouth at bedtime., Disp: 90 tablet, Rfl: 0   SYRINGE-NEEDLE, DISP, 3 ML (BD SAFETYGLIDE SYRINGE/NEEDLE) 25G X 1 3 ML MISC, Use the needle for IM injection once a  month; or as directed., Disp: 30 each, Rfl: 1   tamsulosin  (FLOMAX ) 0.4 MG CAPS capsule, Take 1 capsule (0.4 mg  total) by mouth daily., Disp: 90 capsule, Rfl: 0   tirzepatide  (MOUNJARO ) 5 MG/0.5ML Pen, Inject 5 mg into the skin once a week., Disp: 2 mL, Rfl: 5   triamcinolone  lotion (KENALOG ) 0.1 %, APPLY TO AFFECTED AREA(S)  TOPICALLY 3 TIMES DAILY, Disp: 180 mL, Rfl: 1  Physical exam: Exam limited due to telemedicine  Vitals:   06/05/24 1051  BP: (!) 97/54  Pulse: 99  Resp: 16  Temp: 97.8 F (36.6 C)  SpO2: 97%  Weight: 211 lb 14.4 oz (96.1 kg)   Physical Exam Constitutional:      General: He is not in acute distress.    Appearance: He is not toxic-appearing or diaphoretic.     Comments: Patient and wife participated in call  HENT:     Head: Normocephalic.     Nose: Nose normal.  Cardiovascular:     Rate and Rhythm: Normal rate.     Pulses: Normal pulses.  Pulmonary:     Effort: No respiratory distress.  Abdominal:     General: Bowel sounds are normal.  Musculoskeletal:        General: Normal range of motion.  Skin:    General: Skin  is warm and dry.  Neurological:     Mental Status: He is alert and oriented to person, place, and time.  Psychiatric:        Mood and Affect: Mood normal.         Latest Ref Rng & Units 06/02/2024   11:04 AM  CMP  Glucose 70 - 99 mg/dL 794   BUN 6 - 23 mg/dL 18   Creatinine 9.59 - 1.50 mg/dL 8.72   Sodium 864 - 854 mEq/L 130   Potassium 3.5 - 5.1 mEq/L 5.2 No hemolysis seen   Chloride 96 - 112 mEq/L 93   CO2 19 - 32 mEq/L 27   Calcium 8.4 - 10.5 mg/dL 8.8       Latest Ref Rng & Units 06/05/2024   10:38 AM  CBC  WBC 4.0 - 10.5 K/uL 7.6   Hemoglobin 13.0 - 17.0 g/dL 9.3   Hematocrit 60.9 - 52.0 % 28.5   Platelets 150 - 400 K/uL 568    Iron/TIBC/Ferritin/ %Sat    Component Value Date/Time   IRON 31 (L) 06/05/2024 1038   IRON 98 06/28/2022 1116   TIBC 260 06/05/2024 1038   TIBC 312 06/28/2022 1116   FERRITIN 295 06/05/2024 1038   FERRITIN 143 06/28/2022 1116   IRONPCTSAT 12 (L) 06/05/2024 1038   IRONPCTSAT 31 06/28/2022 1116     Assessment and plan- Patient is a 68 y.o. male    1) Anemia- hg 9.3 today, normocytic since May 2023. Dec 2023 no evidence of iron deficiency. SPEP and K/L LC, hepatitis screening, LDH, haptoglobin, CRP were within normal limits. Peripheral smear without evidence of schistocytes. Oct 2024 bone marrow biopsy Variably cellular bone marrow with trilineage hematopoiesis; The bone marrow is generally normocellular for age with trilineage  hematopoiesis.  There are mild nonspecific changes present, likely  secondary in nature.  Correlation with cytogenetic studies is  recommended. US  2024- OCT 2024-  Increased hepatic parenchymal echogenicity suggestive of steatosis.  No cholelithiasis or sonographic evidence for acute cholecystitis.   # Hmg has dropped now to 9.3 today. Ferritin, iron saturation, TIBC still pending from today.  Mild bleeding with chronic foot wound but otherwise denies blood loss and this has improved since recent orthopedic surgery. Last colonoscopy was Dec 2023. Last endoscopy was 10/2022. He has not had a capsule study. Question intake vs malabsorption vs others? He is symptomatic remains very weak and fatigued.  Proceed with venofer today.  Once iron studies result if depleted will go ahead and proceed with weekly venofer for a total of 5 weeks with a recheck of cbc next week as well.  With recent surgery and Hg trending down will make sure he doesn't continue to trend down further.  Discussed that if he has recurrent drops in iron stores may need to consider capsule study with GI.   2) Hypotension: Trending low BP defer to PCP for management of antihypertensives  3) Mild hyperkalemia:  Seen while reviewing last PCP note from 11/10 at 5.2 PCP aware will repeat BMP next week to ensure stability not on diuretics or potassium supplementation at this time  F/U Plan:  Proceed with venofer today Repeat cbc with diff/bmp with venofer in 1 weeks F/U weekly x 4 weeks for venofer F/U in 8  weeks for cbc with diff/cmp see NP/MD LP     Thank you for allowing me to participate in the care of this very pleasant patient.   Morna Husband AGNP-C [facility] 06/05/24

## 2024-06-05 NOTE — Telephone Encounter (Signed)
 Per Morna, added lab encounter to next weeks' visit. App would like to order cbc and metb when pt comes in for iv venofer.

## 2024-06-06 ENCOUNTER — Telehealth: Payer: Self-pay | Admitting: Gastroenterology

## 2024-06-06 NOTE — Telephone Encounter (Addendum)
 Left message for the patient to call patient. There are openings in December with Pod C PA's.

## 2024-06-06 NOTE — Telephone Encounter (Signed)
 Patient has been scheduled for 12/5 at 10:00

## 2024-06-06 NOTE — Telephone Encounter (Signed)
 Inbound call from patients wife stating that patient was seen with his oncologist and his hemoglobin and red blood count are low and the doctor wants him to have a EGD as soon as he can. Patient was scheduled for a office visit with the PA on 1/5 at 9:00. Patients wife is requesting a call back to discuss and to see if he can be seen sooner. Please advise.

## 2024-06-06 NOTE — Telephone Encounter (Signed)
 Danis pt

## 2024-06-09 ENCOUNTER — Telehealth: Payer: Self-pay | Admitting: Internal Medicine

## 2024-06-09 ENCOUNTER — Telehealth: Payer: Self-pay | Admitting: Family Medicine

## 2024-06-09 NOTE — Telephone Encounter (Signed)
 Per pcp, they will give him a call re: BP mgmt. I called patient back and let him know the plan. Pt gave verbal understanding.

## 2024-06-09 NOTE — Telephone Encounter (Signed)
 Pt calling and states the pt has been weak all weekend and his BP is running low and wants to know if the pt can be seen to day? Please advise. [phone]

## 2024-06-09 NOTE — Telephone Encounter (Signed)
 Noted. Thanks.

## 2024-06-09 NOTE — Telephone Encounter (Signed)
 Patient advised of changes. He will continue to keep eye and check readings. If any red words he will call our office. I have set him up for visit on Friday

## 2024-06-09 NOTE — Telephone Encounter (Signed)
 Was able to reach patient. States that his blood pressure readings have been low for the last few weeks. He was not able to give me any readings at this time. States over the last week he has noticed that with standing he has some dizziness that will come and go. He has been taking the losartan  and amlodipine  as directed. He has not taken any Hydroxyzine  in a few months. Patient was at store today and got weak he didn't loose consciousness but someone did have to catch him before he fell. He is not able to check his blood pressure at this time for me. He is able to have conversation and states that as long as he is sitting he is fine. He does have appointment with oncology for iron infusion and appointment with ID tomorrow.

## 2024-06-09 NOTE — Telephone Encounter (Signed)
 Left message to return call to our office. Patient will need to be triaged for low home blood pressure readings when he calls back.

## 2024-06-09 NOTE — Telephone Encounter (Signed)
 I would hold amlodipine  for now.  Please offer OV re: BP check here in clinic.  Thanks.

## 2024-06-09 NOTE — Telephone Encounter (Signed)
 returned pt's call. pt reports having hypotension and dizziness and light headedness. He checked his bp this morning and it's 100/58. The BP has been lower than this in our office before. patient and pt's wife wanted pt to be evaulated in our office. he does not have an apt with pcp until Feb. I spoke with Morna, NP. she would like pt to be evaluated by pcp. msg sent to pcp to see when they can see patient.

## 2024-06-09 NOTE — Telephone Encounter (Signed)
 Please triage pt about lower BP.  I got a chat message that his BP has been lower on home check.  Thanks.

## 2024-06-10 ENCOUNTER — Inpatient Hospital Stay

## 2024-06-10 ENCOUNTER — Ambulatory Visit: Attending: Infectious Diseases | Admitting: Infectious Diseases

## 2024-06-10 ENCOUNTER — Other Ambulatory Visit: Payer: Self-pay

## 2024-06-10 ENCOUNTER — Ambulatory Visit: Admitting: Infectious Diseases

## 2024-06-10 VITALS — BP 130/70 | HR 73 | Temp 96.9°F

## 2024-06-10 VITALS — BP 98/58 | HR 85 | Temp 97.2°F

## 2024-06-10 DIAGNOSIS — Z79899 Other long term (current) drug therapy: Secondary | ICD-10-CM | POA: Insufficient documentation

## 2024-06-10 DIAGNOSIS — M86171 Other acute osteomyelitis, right ankle and foot: Secondary | ICD-10-CM | POA: Diagnosis not present

## 2024-06-10 DIAGNOSIS — Z89431 Acquired absence of right foot: Secondary | ICD-10-CM | POA: Diagnosis not present

## 2024-06-10 DIAGNOSIS — D649 Anemia, unspecified: Secondary | ICD-10-CM | POA: Diagnosis not present

## 2024-06-10 DIAGNOSIS — R531 Weakness: Secondary | ICD-10-CM

## 2024-06-10 DIAGNOSIS — Z7984 Long term (current) use of oral hypoglycemic drugs: Secondary | ICD-10-CM | POA: Insufficient documentation

## 2024-06-10 DIAGNOSIS — E11621 Type 2 diabetes mellitus with foot ulcer: Secondary | ICD-10-CM | POA: Diagnosis not present

## 2024-06-10 DIAGNOSIS — Z882 Allergy status to sulfonamides status: Secondary | ICD-10-CM | POA: Diagnosis not present

## 2024-06-10 DIAGNOSIS — Z89411 Acquired absence of right great toe: Secondary | ICD-10-CM

## 2024-06-10 DIAGNOSIS — D509 Iron deficiency anemia, unspecified: Secondary | ICD-10-CM | POA: Insufficient documentation

## 2024-06-10 DIAGNOSIS — E039 Hypothyroidism, unspecified: Secondary | ICD-10-CM | POA: Insufficient documentation

## 2024-06-10 DIAGNOSIS — Z794 Long term (current) use of insulin: Secondary | ICD-10-CM | POA: Diagnosis not present

## 2024-06-10 DIAGNOSIS — M869 Osteomyelitis, unspecified: Secondary | ICD-10-CM

## 2024-06-10 DIAGNOSIS — Z833 Family history of diabetes mellitus: Secondary | ICD-10-CM | POA: Diagnosis not present

## 2024-06-10 DIAGNOSIS — Z9842 Cataract extraction status, left eye: Secondary | ICD-10-CM | POA: Diagnosis not present

## 2024-06-10 DIAGNOSIS — R12 Heartburn: Secondary | ICD-10-CM | POA: Diagnosis not present

## 2024-06-10 DIAGNOSIS — B952 Enterococcus as the cause of diseases classified elsewhere: Secondary | ICD-10-CM | POA: Insufficient documentation

## 2024-06-10 DIAGNOSIS — Z89421 Acquired absence of other right toe(s): Secondary | ICD-10-CM

## 2024-06-10 DIAGNOSIS — E1169 Type 2 diabetes mellitus with other specified complication: Secondary | ICD-10-CM | POA: Insufficient documentation

## 2024-06-10 DIAGNOSIS — Z888 Allergy status to other drugs, medicaments and biological substances status: Secondary | ICD-10-CM | POA: Diagnosis not present

## 2024-06-10 DIAGNOSIS — B9620 Unspecified Escherichia coli [E. coli] as the cause of diseases classified elsewhere: Secondary | ICD-10-CM | POA: Insufficient documentation

## 2024-06-10 DIAGNOSIS — Z96642 Presence of left artificial hip joint: Secondary | ICD-10-CM | POA: Diagnosis not present

## 2024-06-10 DIAGNOSIS — T8141XA Infection following a procedure, superficial incisional surgical site, initial encounter: Secondary | ICD-10-CM | POA: Diagnosis not present

## 2024-06-10 DIAGNOSIS — E1122 Type 2 diabetes mellitus with diabetic chronic kidney disease: Secondary | ICD-10-CM | POA: Diagnosis not present

## 2024-06-10 DIAGNOSIS — Z860101 Personal history of adenomatous and serrated colon polyps: Secondary | ICD-10-CM | POA: Diagnosis not present

## 2024-06-10 DIAGNOSIS — D631 Anemia in chronic kidney disease: Secondary | ICD-10-CM | POA: Insufficient documentation

## 2024-06-10 DIAGNOSIS — I959 Hypotension, unspecified: Secondary | ICD-10-CM | POA: Diagnosis not present

## 2024-06-10 DIAGNOSIS — Z886 Allergy status to analgesic agent status: Secondary | ICD-10-CM | POA: Diagnosis not present

## 2024-06-10 DIAGNOSIS — Z7985 Long-term (current) use of injectable non-insulin antidiabetic drugs: Secondary | ICD-10-CM | POA: Diagnosis not present

## 2024-06-10 DIAGNOSIS — Z818 Family history of other mental and behavioral disorders: Secondary | ICD-10-CM | POA: Diagnosis not present

## 2024-06-10 DIAGNOSIS — E875 Hyperkalemia: Secondary | ICD-10-CM | POA: Diagnosis not present

## 2024-06-10 DIAGNOSIS — L89322 Pressure ulcer of left buttock, stage 2: Secondary | ICD-10-CM | POA: Insufficient documentation

## 2024-06-10 DIAGNOSIS — I129 Hypertensive chronic kidney disease with stage 1 through stage 4 chronic kidney disease, or unspecified chronic kidney disease: Secondary | ICD-10-CM | POA: Insufficient documentation

## 2024-06-10 DIAGNOSIS — Z79890 Hormone replacement therapy (postmenopausal): Secondary | ICD-10-CM | POA: Insufficient documentation

## 2024-06-10 DIAGNOSIS — I10 Essential (primary) hypertension: Secondary | ICD-10-CM | POA: Diagnosis not present

## 2024-06-10 DIAGNOSIS — N189 Chronic kidney disease, unspecified: Secondary | ICD-10-CM | POA: Diagnosis not present

## 2024-06-10 DIAGNOSIS — Z7722 Contact with and (suspected) exposure to environmental tobacco smoke (acute) (chronic): Secondary | ICD-10-CM

## 2024-06-10 DIAGNOSIS — R5383 Other fatigue: Secondary | ICD-10-CM | POA: Diagnosis not present

## 2024-06-10 DIAGNOSIS — Z89422 Acquired absence of other left toe(s): Secondary | ICD-10-CM | POA: Diagnosis not present

## 2024-06-10 DIAGNOSIS — D75839 Thrombocytosis, unspecified: Secondary | ICD-10-CM | POA: Diagnosis not present

## 2024-06-10 DIAGNOSIS — Z9841 Cataract extraction status, right eye: Secondary | ICD-10-CM | POA: Diagnosis not present

## 2024-06-10 DIAGNOSIS — D638 Anemia in other chronic diseases classified elsewhere: Secondary | ICD-10-CM | POA: Diagnosis not present

## 2024-06-10 DIAGNOSIS — R5382 Chronic fatigue, unspecified: Secondary | ICD-10-CM | POA: Diagnosis not present

## 2024-06-10 LAB — CBC WITH DIFFERENTIAL (CANCER CENTER ONLY)
Abs Immature Granulocytes: 0.02 K/uL (ref 0.00–0.07)
Basophils Absolute: 0.1 K/uL (ref 0.0–0.1)
Basophils Relative: 1 %
Eosinophils Absolute: 0.6 K/uL — ABNORMAL HIGH (ref 0.0–0.5)
Eosinophils Relative: 8 %
HCT: 30.5 % — ABNORMAL LOW (ref 39.0–52.0)
Hemoglobin: 9.8 g/dL — ABNORMAL LOW (ref 13.0–17.0)
Immature Granulocytes: 0 %
Lymphocytes Relative: 22 %
Lymphs Abs: 1.6 K/uL (ref 0.7–4.0)
MCH: 27.8 pg (ref 26.0–34.0)
MCHC: 32.1 g/dL (ref 30.0–36.0)
MCV: 86.6 fL (ref 80.0–100.0)
Monocytes Absolute: 0.6 K/uL (ref 0.1–1.0)
Monocytes Relative: 8 %
Neutro Abs: 4.6 K/uL (ref 1.7–7.7)
Neutrophils Relative %: 61 %
Platelet Count: 489 K/uL — ABNORMAL HIGH (ref 150–400)
RBC: 3.52 MIL/uL — ABNORMAL LOW (ref 4.22–5.81)
RDW: 15.1 % (ref 11.5–15.5)
WBC Count: 7.4 K/uL (ref 4.0–10.5)
nRBC: 0 % (ref 0.0–0.2)

## 2024-06-10 LAB — BASIC METABOLIC PANEL - CANCER CENTER ONLY
Anion gap: 12 (ref 5–15)
BUN: 26 mg/dL — ABNORMAL HIGH (ref 8–23)
CO2: 31 mmol/L (ref 22–32)
Calcium: 9.3 mg/dL (ref 8.9–10.3)
Chloride: 87 mmol/L — ABNORMAL LOW (ref 98–111)
Creatinine: 1.5 mg/dL — ABNORMAL HIGH (ref 0.61–1.24)
GFR, Estimated: 50 mL/min — ABNORMAL LOW (ref >60)
Glucose, Bld: 182 mg/dL — ABNORMAL HIGH (ref 70–99)
Potassium: 4.1 mmol/L (ref 3.5–5.1)
Sodium: 130 mmol/L — ABNORMAL LOW (ref 135–145)

## 2024-06-10 LAB — C-REACTIVE PROTEIN: CRP: 2 mg/dL — ABNORMAL HIGH (ref <1.0)

## 2024-06-10 LAB — SEDIMENTATION RATE: Sed Rate: 83 mm/h — ABNORMAL HIGH (ref 0–20)

## 2024-06-10 MED ORDER — IRON SUCROSE 20 MG/ML IV SOLN
200.0000 mg | Freq: Once | INTRAVENOUS | Status: AC
  Administered 2024-06-10: 200 mg via INTRAVENOUS
  Filled 2024-06-10: qty 10

## 2024-06-10 MED ORDER — CIPROFLOXACIN HCL 500 MG PO TABS: 500.0000 mg | ORAL_TABLET | Freq: Two times a day (BID) | ORAL | 0 refills | Status: DC

## 2024-06-10 NOTE — Progress Notes (Signed)
 NAME: [name]  DOB: [birth year]  MRN: [id number]  Date/Time: 06/10/2024 12:48 PM   Subjective:  Referred by Dr.Fowler Pt here ith wife Agreed to the use of AI scribe ? [name] is a 68 year old male with a history  DM, Left THR, HTN  foot infections and amputations who presents for evaluation of a post-surgical foot infection. He is accompanied by his wife, [name]. He was referred by Dr. Ashley for evaluation of a post-surgical foot infection.  He has a history of foot infections, including a transmetatarsal amputation of his right toes seven years ago due to infection. Recently, he developed an ulcer on the bottom of his foot, which led to surgery on May 23, 2024. The surgery involved irrigation and debridement of an ulcer and abscess on the right foot, as well as excision of the second, third, and fourth metatarsal bones. He was hospitalized for five days post-surgery and received IV antibiotics, including vancomycin  and Zosyn .  Was discharged  ciprofloxacin  and cefadroxil, both 500 mg twice daily. He has been on ciprofloxacin  since May 23, 2024. Culture came back as enterobacter and ecoli His wife reports that the wound started to get red three days ago. He has been experiencing significant weakness and low blood pressure since returning home, with a recorded blood pressure of 78/58 mmHg yesterday. HIS PCP stopped amlodipine  He also has a bed sore from the hospital stay, which is being treated with Dermacloud.  He has a history of diabetes, managed with metformin , and his blood sugar levels have been stable between 100 and 200 mg/dL. He is also on multiple medications, including losartan  and Farxiga , but amlodipine  was recently stopped due to low blood pressure. He has been experiencing significant fatigue and weakness, impacting his ability to perform daily activities.  His past medical history includes a left hip replacement and a recent diagnosis of hypothyroidism, for  which he started Synthroid in the hospital. He has a history of anemia and has been receiving iron infusions at the cancer center. His hemoglobin was 9.3 g/dL on June 05, 2024. He has a history of high blood pressure and is on losartan , but his blood pressure has been low since the surgery.  As the pathology came back as acute osteomyelitis of the margin I am asked to see the patient  Past Medical History:  Diagnosis Date   Adenomatous polyp    Allergy     Anemia    Arthritis    feet    Bunion 04/02/2013   STATUS POST OP BUN REPAIR   Diabetes mellitus    Foot ulcer (HCC)    GERD (gastroesophageal reflux disease)    Gout    Hammertoe    Hyperlipidemia    Hypertension    Neuromuscular disorder (HCC)    neuropathy   Plantar flexed metatarsal    Psoriasis    Schatzki's ring     Past Surgical History:  Procedure Laterality Date   ACHILLES TENDON LENGTHENING Right 05/23/2024   Procedure: LENGTHENING, TENDON, ACHILLES;  Surgeon: Ashley Soulier, DPM;  Location: [facility];  Service: Orthopedics/Podiatry;  Laterality: Right;   AMPUTATION TOE Left 05/08/2023   Procedure: AMPUTATION 5TH TOE WITH PARTIAL RAY RESECTION;  Surgeon: Neill Boas, DPM;  Location: [facility];  Service: Orthopedics/Podiatry;  Laterality: Left;   CARPAL TUNNEL RELEASE Left 08/24/2022   Procedure: CARPAL TUNNEL RELEASE;  Surgeon: Cleotilde Barrio, MD;  Location: [facility];  Service: Orthopedics;  Laterality: Left;   CATARACT EXTRACTION W/PHACO  Left 03/21/2023   Procedure: CATARACT EXTRACTION PHACO AND INTRAOCULAR LENS PLACEMENT (IOC) LEFT MALYUGIN DIABETIC 9.88 1:01.4;  Surgeon: Mittie Gaskin, MD;  Location: [facility];  Service: Ophthalmology;  Laterality: Left;   CATARACT EXTRACTION W/PHACO Right 04/04/2023   Procedure: CATARACT EXTRACTION PHACO AND INTRAOCULAR LENS PLACEMENT (IOC) RIGHT MALYUGIN DIABETIC 15.31 01:15.1;  Surgeon: Mittie Gaskin, MD;  Location: [facility];  Service:  Ophthalmology;  Laterality: Right;   COLONOSCOPY     EYE SURGERY     Cataracts   FOOT AMPUTATION Right 01/11/2017   Dr Tisa ([facility])--transmetatarsal   FOOT SURGERY Right 02/28/2013   ALLEEN HAM TOE2,3 PINS ,2ND MET OSTEOTOMY, 5TH MET W/SCREW   02-2013, 04-2013   HIP FRACTURE SURGERY  1993   surgery x2   IR BONE MARROW BIOPSY & ASPIRATION  04/27/2023   IRRIGATION AND DEBRIDEMENT FOOT Right 05/23/2024   Procedure: IRRIGATION AND DEBRIDEMENT FOOT;  Surgeon: Ashley Soulier, DPM;  Location: [facility];  Service: Orthopedics/Podiatry;  Laterality: Right;   JOINT REPLACEMENT     LOWER EXTREMITY ANGIOGRAPHY Left 05/07/2023   Procedure: Lower Extremity Angiography;  Surgeon: Marea Selinda RAMAN, MD;  Location: [facility];  Service: Cardiovascular;  Laterality: Left;   NASAL SEPTOPLASTY W/ TURBINOPLASTY Bilateral 02/08/2023   Procedure: NASAL SEPTOPLASTY WITH TURBINATE REDUCTION;  Surgeon: Edda Mt, MD;  Location: [facility];  Service: ENT;  Laterality: Bilateral;   POLYPECTOMY     PROSTATE BIOPSY  10/10/2019   TOTAL HIP ARTHROPLASTY     Left hip replacement/femur fx 07/04, Screw removal left hip- [facility] 11/01    Social History   Socioeconomic History   Marital status: Married    Spouse name: Not on file   Number of children: 3   Years of education: Not on file   Highest education level: 12th grade  Occupational History   Occupation: PC ADMINISTRATOR    Employer: [employer]    Comment: Retired 3/25  Tobacco Use   Smoking status: Never    Passive exposure: Past   Smokeless tobacco: Never  Vaping Use   Vaping status: Never Used  Substance and Sexual Activity   Alcohol  use: Yes    Alcohol /week: 1.0 standard drink of alcohol     Types: 1 Standard drinks or equivalent per week    Comment: occasional (DWI 1991)   Drug use: No   Sexual activity: Yes    Birth control/protection: None  Other Topics Concern   Not on file  Social History Narrative   alcohol  2-3 times a week;  never smoked; lives in [place]. With wife; and 2 dog.        No living will   Wife would be health care POA---no clear alternate   Would accept resuscitation   No feeding tube if cognitively unaware   Social Drivers of Health   Financial Resource Strain: Low Risk  (05/13/2024)   Received from [facility]   Overall Financial Resource Strain (CARDIA)    Difficulty of Paying Living Expenses: Not hard at all  Food Insecurity: No Food Insecurity (05/21/2024)   Hunger Vital Sign    Worried About Running Out of Food in the Last Year: Never true    Ran Out of Food in the Last Year: Never true  Transportation Needs: No Transportation Needs (05/21/2024)   PRAPARE - Administrator, Civil Service (Medical): No    Lack of Transportation (Non-Medical): No  Physical Activity: Inactive (03/03/2024)   Exercise Vital Sign  Days of Exercise per Week: 0 days    Minutes of Exercise per Session: Not on file  Stress: No Stress Concern Present (03/03/2024)   [employer] of Occupational Health - Occupational Stress Questionnaire    Feeling of Stress: Only a little  Social Connections: Unknown (05/21/2024)   Social Connection and Isolation Panel    Frequency of Communication with Friends and Family: Twice a week    Frequency of Social Gatherings with Friends and Family: Patient declined    Attends Religious Services: More than 4 times per year    Active Member of [employer] or Organizations: No    Attends Engineer, Structural: Not on file    Marital Status: Married  Catering Manager Violence: Not At Risk (05/21/2024)   Humiliation, Afraid, Rape, and Kick questionnaire    Fear of Current or Ex-Partner: No    Emotionally Abused: No    Physically Abused: No    Sexually Abused: No    Family History  Problem Relation Age of Onset   Diabetes Mother    Early death Father    Diabetes Brother    Alzheimer's disease Maternal Aunt    Alzheimer's disease Cousin     Cancer Neg Hx    Colon cancer Neg Hx    Colon polyps Neg Hx    Rectal cancer Neg Hx    Stomach cancer Neg Hx    Esophageal cancer Neg Hx    Allergies  Allergen Reactions   Glipizide Other (See Comments)    REACTION: wt. gain, hands tingling   Lisinopril  Other (See Comments)    HYPERKALEMIA   Ramipril Cough   Ibuprofen     UNSPECIFIED REACTION    Bactrim  [Sulfamethoxazole -Trimethoprim ] Nausea And Vomiting and Rash   I? Current Outpatient Medications  Medication Sig Dispense Refill   Alcohol  Swabs  (ALCOHOL  PREP) 70 % PADS USE 1 PREP PAD TWICE DAILY 200 each 3   Alpha-Lipoic Acid 600 MG TABS Take by mouth. 3 in am and 1 in pm     amLODipine  (NORVASC ) 5 MG tablet TAKE 1 TABLET(5 MG) BY MOUTH DAILY 90 tablet 3   CALCIUM-MAGNESIUM -ZINC  PO Take by mouth.     cefadroxil (DURICEF) 500 MG capsule Take 500 mg by mouth.     Cinnamon 500 MG capsule Take 500 mg by mouth 2 (two) times daily.     ciprofloxacin  (CIPRO ) 500 MG tablet Take 500 mg by mouth.     clobetasol cream (TEMOVATE) 0.05 % Apply 1 Application topically 2 (two) times daily.     CONTOUR NEXT TEST test strip USE 1 TO 2 TIMES DAILY TO CHECK  BLOOD SUGAR 200 strip 3   cyanocobalamin  (VITAMIN B12) 1000 MCG/ML injection 1 ml injection-once a month. 12 mL 1   dapagliflozin  propanediol (FARXIGA ) 5 MG TABS tablet Take 1 tablet (5 mg total) by mouth daily. 90 tablet 3   DULoxetine  (CYMBALTA ) 60 MG capsule Take 2 capsules (120 mg total) by mouth daily. 180 capsule 3   finasteride  (PROSCAR ) 5 MG tablet Take 1 tablet (5 mg total) by mouth daily. 90 tablet 0   furosemide  (LASIX ) 20 MG tablet Take 1 tablet (20 mg total) by mouth daily as needed. 90 tablet 0   gabapentin  (NEURONTIN ) 400 MG capsule Take 2 capsules (800 mg total) by mouth 3 (three) times daily. 540 capsule 3   hydrocortisone  valerate ointment (WEST-CORT) 0.2 % APPLY TOPICALLY TO AFFECTED  AREA(S) TWICE DAILY 120 g 1   hydrOXYzine  (VISTARIL ) 25 MG  capsule TAKE 1 CAPSULE(25 MG)  BY MOUTH EVERY 8 HOURS AS NEEDED FOR ITCHING 60 capsule 3   insulin  glargine (LANTUS  SOLOSTAR) 100 UNIT/ML Solostar Pen Inject 24 Units into the skin daily. 1 mL 3   Insulin  Pen Needle (B-D UF III MINI PEN NEEDLES) 31G X 5 MM MISC USE AS DIRECTED DAILY 90 each 3   levothyroxine (SYNTHROID) 100 MCG tablet Take 1 tablet (100 mcg total) by mouth daily at 6 (six) AM. 30 tablet 0   losartan  (COZAAR ) 25 MG tablet TAKE 1 TABLET(25 MG) BY MOUTH DAILY 90 tablet 3   metFORMIN  (GLUCOPHAGE ) 1000 MG tablet Take 1 tablet (1,000 mg total) by mouth 2 (two) times daily with a meal. 180 tablet 3   montelukast (SINGULAIR) 10 MG tablet Take 10 mg by mouth daily.     Multiple Vitamin (MULTIVITAMIN WITH MINERALS) TABS tablet Take 1 tablet by mouth daily. 30 tablet 2   Oxycodone  HCl 10 MG TABS Take 1 tablet by mouth 5 (five) times daily.     pantoprazole (PROTONIX) 40 MG tablet Take 1 tablet (40 mg total) by mouth daily. 90 tablet 1   simvastatin  (ZOCOR ) 40 MG tablet Take 1 tablet (40 mg total) by mouth at bedtime. 90 tablet 0   SYRINGE-NEEDLE, DISP, 3 ML (BD SAFETYGLIDE SYRINGE/NEEDLE) 25G X 1 3 ML MISC Use the needle for IM injection once a  month; or as directed. 30 each 1   tamsulosin  (FLOMAX ) 0.4 MG CAPS capsule Take 1 capsule (0.4 mg total) by mouth daily. 90 capsule 0   tirzepatide  (MOUNJARO ) 5 MG/0.5ML Pen Inject 5 mg into the skin once a week. 2 mL 5   triamcinolone  lotion (KENALOG ) 0.1 % APPLY TO AFFECTED AREA(S)  TOPICALLY 3 TIMES DAILY 180 mL 1   No current facility-administered medications for this visit.     Abtx:  Anti-infectives (From admission, onward)    None       REVIEW OF SYSTEMS:  Const: negative fever, negative chills, negative weight loss Eyes: negative diplopia or visual changes, negative eye pain ENT: negative coryza, negative sore throat Resp: negative cough, hemoptysis, dyspnea Cards: negative for chest pain, palpitations, lower extremity edema GU: negative for frequency,  dysuria and hematuria GI: Negative for abdominal pain, diarrhea, bleeding, constipation Skin: negative for rash and pruritus Heme: negative for easy bruising and gum/nose bleeding MS:  weakness Neurolo: dizziness, , memory problems  Psych: negative for feelings of anxiety, depression  Endocrine:hypothyroid, diabetes Allergy /Immunology- Bactrim  And other meds Objective:  VITALS:  BP (!) 98/58   Pulse 85   Temp (!) 97.2 F (36.2 C) (Temporal)   SpO2 97%   PHYSICAL EXAM:  General: Alert, cooperative, no distress, in wheel chair, pale Head: Normocephalic, without obvious abnormality, atraumatic. Eyes: Conjunctivae clear, anicteric sclerae. Pupils are equal ENT Nares normal. No drainage or sinus tenderness. Lips, mucosa, and tongue normal. No Thrush Neck: Supple, symmetrical, no adenopathy, thyroid: non tender no carotid bruit and no JVD. Back: No CVA tenderness. Lungs: Clear to auscultation bilaterally. No Wheezing or Rhonchi. No rales. Heart: Regular rate and rhythm, no murmur, rub or gallop. Abdomen: did not examine Extremities: rt foot- dresisng removed 06/10/24-  TMA site healing - minimal maceration     Before surgery      Skin: No rashes or lesions. Or bruising Lymph: Cervical, supraclavicular normal. Neurologic: Grossly non-focal Pertinent Labs Lab Results CBC    Component Value Date/Time   WBC 7.6 06/05/2024 1038   WBC 12.0 (H) 06/02/2024  1104   RBC 3.31 (L) 06/05/2024 1038   HGB 9.3 (L) 06/05/2024 1038   HGB 11.2 (L) 09/06/2023 0856   HCT 28.5 (L) 06/05/2024 1038   HCT 34.4 (L) 09/06/2023 0856   PLT 568 (H) 06/05/2024 1038   PLT 314 09/06/2023 0856   MCV 86.1 06/05/2024 1038   MCV 88 09/06/2023 0856   MCH 28.1 06/05/2024 1038   MCHC 32.6 06/05/2024 1038   RDW 14.6 06/05/2024 1038   RDW 12.8 09/06/2023 0856   LYMPHSABS 1.4 06/05/2024 1038   LYMPHSABS 1.3 06/06/2023 0831   MONOABS 0.6 06/05/2024 1038   EOSABS 0.5 06/05/2024 1038   EOSABS 0.4  06/06/2023 0831   BASOSABS 0.1 06/05/2024 1038   BASOSABS 0.0 06/06/2023 0831       Latest Ref Rng & Units 06/02/2024   11:04 AM 05/26/2024    7:08 PM 05/23/2024    3:54 AM  CMP  Glucose 70 - 99 mg/dL 794  839  874   BUN 6 - 23 mg/dL 18  17  16    Creatinine 0.40 - 1.50 mg/dL 8.72  8.85  8.97   Sodium 135 - 145 mEq/L 130  128  130   Potassium 3.5 - 5.1 mEq/L 5.2 No hemolysis seen  4.4  4.3   Chloride 96 - 112 mEq/L 93  94  98   CO2 19 - 32 mEq/L 27  25  23    Calcium 8.4 - 10.5 mg/dL 8.8  8.4  8.1       Microbiology: No results found for this or any previous visit (from the past 240 hours).  Lines and Device Date on insertion # of days DC  Central line     Foley     ETT      Patient has: []  acute illness w/systemic sxs  [mod] [x]  illness posing risk to life or function  [high]  I reviewed:  (3+) []  primary team note [x]  consultant note(s) []  procedure/op note(s) []  micro result(s)   [x]  CBC results [x]  chemistry results [x]  radiology report(s) []  nursing note(s)  I independently visualized:  (any)   []  cxs/plates in lab []  plain film images []  CT images []  PET images   []  path slide(s) []  ECG tracing [x]  MRI images []  nuclear scan  I discussed: (any) [x]  micro and/or path w/lab personnel []  drug options and/or interactions w/ID pharmD   []  procedure/OR findings w/other MD(s) []  echo and/or imaging w/other MD(s)   [x]  mgm't w/attending(s) involved in case []  setting up home abx w/OPAT team  Mgm't requires: []  prescription drug(s)  [mod] [x]  intensive toxicity monitoring  [high]   IMAGING RESULTS: I have personally reviewed the MRI rt foot from 05/21/24 Dorsal fluid collection in the soft tissue Suspicious for abscess Distal ulceration on the plantar stump Reactive edema of the 2/3 metatarsals ? Impression/Recommendation ?Acute osteomyelitis of right foot post-surgical debridement at  transmetatarsal amputation site ulcer on October 31st, 2025. Cultures showed E. coli  and Enterobacter, both sensitive to ciprofloxacin . Currently on ciprofloxacin  and cefadroxil, but cefadroxil is unnecessary as ciprofloxacin  covers the pathogens. No signs of infection in the foot currently. - Continue ciprofloxacin  for a total of 4-6 weeks. HE has completed 21 days so far - Discontinued cefadroxil. - Monitor for muscle pain, tendon pain, or ligament pain due to prolonged ciprofloxacin  use. - Ordered sed rate and CRP to assess inflammation. - Scheduled follow-up with Dr. Ashley on Thursday. - Scheduled follow-up appointment on December 2nd, 2025.  Type 2 diabetes mellitus Type 2 diabetes mellitus, currently managed with metformin , Lantus , and Mounjaro . Blood glucose levels have been stable between 100-180 mg/dL. Recent weight loss noted, possibly related to Mounjaro . Decision made to continue Mounjaro  as it aids in diabetes management. - Continue current diabetes medications including Mounjaro . - Monitor blood glucose levels regularly.  Weakness and low blood pressure noted post-surgery, possibly related to medication or underlying conditions.  Iron deficiency anemia Recent transfusion and iron supplementation. Hemoglobin was 9.3 g/dL on November 11th, 2025. Ongoing iron supplementation with weekly infusions. - Continue weekly iron infusions. - Follow up with cancer center for further evaluation.  Hypothyroidism Recently diagnosed hypothyroidism, currently on Synthroid 100 mcg daily. Recent initiation of treatment may contribute to improved energy levels. - Continue Synthroid 100 mcg daily. - Monitor thyroid function tests.  Essential hypertension Essential hypertension, currently managed with losartan . Recent low blood pressure noted, possibly related to medication or underlying conditions. Amlodipine  was recently discontinued due to low blood pressure. - Continue to monitor blood pressure regularly. - Discuss potential need for cortisol level testing with primary care  physician.  Chronic kidney disease Recent potassium level of 5.2 mmol/L. Monitoring required due to potential medication interactions and electrolyte imbalances. - Continue to monitor kidney function and electrolytes regularly.  Stage 2 pressure ulcer of left buttock Pressure ulcer on the left buttock, currently managed with Dermacloud. No signs of infection noted. - Continue current wound care regimen with Dermacloud. - Monitor for signs of infection or worsening. ? ? I have personally spent  -60--minutes involved in face-to-face and non-face-to-face activities for this patient on the day of the visit. Professional time spent includes the following activities: Preparing to see the patient (review of tests), Obtaining and/or reviewing separately obtained history (admission/discharge record), Performing a medically appropriate examination and/or evaluation , Ordering medications/tests/procedures, referring and communicating with other health care professionals, Documenting clinical information in the EMR, Independently interpreting results (not separately reported), Communicating results to the patient/wife, Counseling and educating the patient/wife and Care coordination (not separately reported).    __ Communicated with Dr.Fowler______________________________________________  Note:  This document was prepared using Conservation officer, historic buildings and may include unintentional dictation errors.

## 2024-06-10 NOTE — Patient Instructions (Signed)
 VISIT SUMMARY:  Today, you were seen for a follow-up on your post-surgical foot infection. We discussed your recent surgery, current medications, and symptoms including weakness and low blood pressure. We also reviewed your diabetes management, anemia, hypothyroidism, high blood pressure, chronic kidney disease, and a pressure ulcer.  YOUR PLAN:  -ACUTE OSTEOMYELITIS OF RIGHT FOOT POST-SURGICAL DEBRIDEMENT AT THE   TRANSMETATARSAL AMPUTATION: Acute osteomyelitis is a bone infection. You will continue taking ciprofloxacin  for 4-6 weeks and stop taking cefadroxil. Watch for any muscle, tendon, or ligament pain. We have ordered tests to check for inflammation and scheduled follow-up appointments with Dr. Ashley and another on December 2nd, 2025.  -TYPE 2 DIABETES MELLITUS: Type 2 diabetes is a condition where your body does not use insulin  properly. Your blood sugar levels have been stable, and you will continue your current medications, including Mounjaro . Please monitor your blood glucose levels regularly.  -IRON DEFICIENCY ANEMIA: Iron deficiency anemia is a condition where you do not have enough iron in your blood. You will continue with your weekly iron infusions and follow up with the cancer center for further evaluation.  -HYPOTHYROIDISM: Hypothyroidism is a condition where your thyroid does not produce enough hormones. You will continue taking Synthroid 100 mcg daily and monitor your thyroid function tests.  -ESSENTIAL HYPERTENSION: Essential hypertension is high blood pressure with no identifiable cause. You will continue to monitor your blood pressure regularly. as your bp is low , and you have high K and feeling tired and a new diagnosis of hypothyroidism discuss with your primary care physician if you need cortisol level testing.  -CHRONIC KIDNEY DISEASE: Chronic kidney disease is a condition where your kidneys are damaged and cannot filter blood properly. We will continue to monitor your  kidney function and electrolytes regularly.  -STAGE 2 PRESSURE ULCER OF LEFT BUTTOCK: A stage 2 pressure ulcer is a sore that affects the top layers of skin. You will continue using Dermacloud for wound care and monitor for any signs of infection or worsening.  INSTRUCTIONS:  Please follow up with Dr. Ashley on Thursday and attend your scheduled appointment on December 2nd, 2025. Continue monitoring your blood pressure and blood glucose levels regularly. Watch for any signs of infection or worsening of your pressure ulcer. Discuss the potential need for cortisol level testing with your primary care physician.

## 2024-06-12 ENCOUNTER — Ambulatory Visit: Admitting: Family Medicine

## 2024-06-12 ENCOUNTER — Ambulatory Visit: Payer: Self-pay | Admitting: Infectious Diseases

## 2024-06-13 ENCOUNTER — Ambulatory Visit: Payer: Self-pay

## 2024-06-13 ENCOUNTER — Encounter: Payer: Self-pay | Admitting: Internal Medicine

## 2024-06-13 ENCOUNTER — Other Ambulatory Visit: Payer: Self-pay

## 2024-06-13 MED ORDER — BD PEN NEEDLE MINI U/F 31G X 5 MM MISC: 3 refills | Status: AC

## 2024-06-13 NOTE — Telephone Encounter (Signed)
 Spoke with pt's wife, [name]. She stated that everything in the message (CRM) was incorrect. Pt is NOT experiencing dizziness, weakness or headaches. His BP is NOT 80/50. [name] states that all of this was happening to the pt LAST WEEK before Dr. Cleatus advised him to stop taking amlodipine . She tried to explaining this to the person who took her call when she called in earlier. After stopping this medication his BP has returned to a normal range and the pt's symptoms have resolved. [name] was only calling to see if they could get a sooner appointment with Dr. Cleatus. Advised her that [facility] does not have any available appts today or Monday. Pt is scheduled to see Tabitha on 06/16/24 and they are okay with keeping this appointment. [name] asked for an appointment to be scheduled with Cleatus the first week of December. This has been scheduled per her request. I have gone over ER precautions with the pt's wife in case his symptoms return over the weekend. She verbalized understanding and appreciated our office's help.  Information will be passed along to management about the incorrect information relayed through CMR #[id number].

## 2024-06-13 NOTE — Telephone Encounter (Signed)
 FYI Only or Action Required?: Action required by provider: request for appointment.  Patient was last seen in primary care on 06/02/2024 by Vincente Shivers, NP.  Called Nurse Triage reporting Appointment.  Triage Disposition: Home Care  Patient/caregiver understands and will follow disposition?: Yes     Copied from CRM [id number]. Topic: Clinical - Red Word Triage >> Jun 13, 2024 12:27 PM [name] wrote: [facility] that prompted transfer to Nurse Triage: Blood pressure is around the range of 80/50. He is experiencing dizziness, weakness, and headaches.  The patient's wife ONLY  wants an acute appointment specifically with [name].       Reason for Disposition  Nursing judgment or information in reference  Answer Assessment - Initial Assessment Questions 1. REASON FOR CALL: What is your main concern right now?     Patient's wife calling to see if there is an earlier [facility] appointment with [name]. I advised that there were no earlier appointments but that I have added his appointment to the wait list in case something opens up. She states that the patient does not have any symptoms today and has an appointment with a NP in the office on Monday. She denies any other needs at this time.  Protocols used: No Guideline Available-A-AH

## 2024-06-15 DIAGNOSIS — L97518 Non-pressure chronic ulcer of other part of right foot with other specified severity: Secondary | ICD-10-CM | POA: Diagnosis not present

## 2024-06-15 DIAGNOSIS — E1169 Type 2 diabetes mellitus with other specified complication: Secondary | ICD-10-CM | POA: Diagnosis not present

## 2024-06-15 DIAGNOSIS — E11621 Type 2 diabetes mellitus with foot ulcer: Secondary | ICD-10-CM | POA: Diagnosis not present

## 2024-06-15 DIAGNOSIS — M869 Osteomyelitis, unspecified: Secondary | ICD-10-CM | POA: Diagnosis not present

## 2024-06-16 ENCOUNTER — Inpatient Hospital Stay

## 2024-06-16 ENCOUNTER — Other Ambulatory Visit: Payer: Self-pay

## 2024-06-16 ENCOUNTER — Ambulatory Visit: Admitting: Family

## 2024-06-16 VITALS — BP 126/80 | HR 87 | Temp 98.5°F | Wt 210.6 lb

## 2024-06-16 VITALS — BP 156/71 | HR 72 | Temp 97.0°F | Resp 18

## 2024-06-16 DIAGNOSIS — R700 Elevated erythrocyte sedimentation rate: Secondary | ICD-10-CM | POA: Diagnosis not present

## 2024-06-16 DIAGNOSIS — D638 Anemia in other chronic diseases classified elsewhere: Secondary | ICD-10-CM | POA: Diagnosis not present

## 2024-06-16 DIAGNOSIS — E538 Deficiency of other specified B group vitamins: Secondary | ICD-10-CM

## 2024-06-16 DIAGNOSIS — R972 Elevated prostate specific antigen [PSA]: Secondary | ICD-10-CM

## 2024-06-16 DIAGNOSIS — R5383 Other fatigue: Secondary | ICD-10-CM | POA: Diagnosis not present

## 2024-06-16 DIAGNOSIS — D509 Iron deficiency anemia, unspecified: Secondary | ICD-10-CM

## 2024-06-16 DIAGNOSIS — M792 Neuralgia and neuritis, unspecified: Secondary | ICD-10-CM | POA: Diagnosis not present

## 2024-06-16 DIAGNOSIS — Z9842 Cataract extraction status, left eye: Secondary | ICD-10-CM | POA: Diagnosis not present

## 2024-06-16 DIAGNOSIS — Z89422 Acquired absence of other left toe(s): Secondary | ICD-10-CM | POA: Diagnosis not present

## 2024-06-16 DIAGNOSIS — Z888 Allergy status to other drugs, medicaments and biological substances status: Secondary | ICD-10-CM | POA: Diagnosis not present

## 2024-06-16 DIAGNOSIS — R7989 Other specified abnormal findings of blood chemistry: Secondary | ICD-10-CM

## 2024-06-16 DIAGNOSIS — R7982 Elevated C-reactive protein (CRP): Secondary | ICD-10-CM | POA: Diagnosis not present

## 2024-06-16 DIAGNOSIS — E119 Type 2 diabetes mellitus without complications: Secondary | ICD-10-CM | POA: Diagnosis not present

## 2024-06-16 DIAGNOSIS — Z794 Long term (current) use of insulin: Secondary | ICD-10-CM

## 2024-06-16 DIAGNOSIS — Z23 Encounter for immunization: Secondary | ICD-10-CM

## 2024-06-16 DIAGNOSIS — Z860101 Personal history of adenomatous and serrated colon polyps: Secondary | ICD-10-CM | POA: Diagnosis not present

## 2024-06-16 DIAGNOSIS — Z882 Allergy status to sulfonamides status: Secondary | ICD-10-CM | POA: Diagnosis not present

## 2024-06-16 DIAGNOSIS — Z7984 Long term (current) use of oral hypoglycemic drugs: Secondary | ICD-10-CM

## 2024-06-16 DIAGNOSIS — Z818 Family history of other mental and behavioral disorders: Secondary | ICD-10-CM | POA: Diagnosis not present

## 2024-06-16 DIAGNOSIS — D75839 Thrombocytosis, unspecified: Secondary | ICD-10-CM | POA: Diagnosis not present

## 2024-06-16 DIAGNOSIS — M869 Osteomyelitis, unspecified: Secondary | ICD-10-CM | POA: Diagnosis not present

## 2024-06-16 DIAGNOSIS — E875 Hyperkalemia: Secondary | ICD-10-CM | POA: Diagnosis not present

## 2024-06-16 DIAGNOSIS — E1169 Type 2 diabetes mellitus with other specified complication: Secondary | ICD-10-CM | POA: Diagnosis not present

## 2024-06-16 DIAGNOSIS — D649 Anemia, unspecified: Secondary | ICD-10-CM | POA: Diagnosis not present

## 2024-06-16 DIAGNOSIS — E871 Hypo-osmolality and hyponatremia: Secondary | ICD-10-CM

## 2024-06-16 DIAGNOSIS — I10 Essential (primary) hypertension: Secondary | ICD-10-CM | POA: Diagnosis not present

## 2024-06-16 DIAGNOSIS — R12 Heartburn: Secondary | ICD-10-CM | POA: Diagnosis not present

## 2024-06-16 DIAGNOSIS — R5382 Chronic fatigue, unspecified: Secondary | ICD-10-CM | POA: Diagnosis not present

## 2024-06-16 DIAGNOSIS — Z79899 Other long term (current) drug therapy: Secondary | ICD-10-CM | POA: Diagnosis not present

## 2024-06-16 DIAGNOSIS — Z79890 Hormone replacement therapy (postmenopausal): Secondary | ICD-10-CM | POA: Diagnosis not present

## 2024-06-16 DIAGNOSIS — Z833 Family history of diabetes mellitus: Secondary | ICD-10-CM | POA: Diagnosis not present

## 2024-06-16 DIAGNOSIS — I959 Hypotension, unspecified: Secondary | ICD-10-CM | POA: Diagnosis not present

## 2024-06-16 DIAGNOSIS — Z9841 Cataract extraction status, right eye: Secondary | ICD-10-CM | POA: Diagnosis not present

## 2024-06-16 DIAGNOSIS — Z886 Allergy status to analgesic agent status: Secondary | ICD-10-CM | POA: Diagnosis not present

## 2024-06-16 LAB — T3, FREE: T3, Free: 2.6 pg/mL (ref 2.3–4.2)

## 2024-06-16 LAB — B12 AND FOLATE PANEL
Folate: >23.7 ng/mL (ref >5.9)
Vitamin B-12: 820 pg/mL (ref 211–911)

## 2024-06-16 LAB — TSH: TSH: 0.5 u[IU]/mL (ref 0.35–5.50)

## 2024-06-16 LAB — HEMOGLOBIN A1C: Hgb A1c MFr Bld: 7.1 % — ABNORMAL HIGH (ref 4.6–6.5)

## 2024-06-16 LAB — T4, FREE: Free T4: 1.05 ng/dL (ref 0.60–1.60)

## 2024-06-16 MED ORDER — LEVOTHYROXINE SODIUM 100 MCG PO TABS: 100.0000 ug | ORAL_TABLET | Freq: Every day | ORAL | 0 refills | Status: DC

## 2024-06-16 MED ORDER — IRON SUCROSE 20 MG/ML IV SOLN
200.0000 mg | Freq: Once | INTRAVENOUS | Status: AC
  Administered 2024-06-16: 200 mg via INTRAVENOUS
  Filled 2024-06-16: qty 10

## 2024-06-16 MED ORDER — DEXAMETHASONE 1 MG PO TABS: ORAL_TABLET | ORAL | 0 refills | Status: DC

## 2024-06-16 NOTE — Patient Instructions (Signed)
  VISIT SUMMARY: Today, we discussed your recent health issues, including low blood pressure, weakness, and diabetes management. We reviewed your current medications and made some adjustments to help stabilize your conditions. We also addressed your ongoing iron  deficiency anemia, thyroid  disorder, and other health concerns.  YOUR PLAN: -DIABETIC FOOT INFECTION: You have a history of a diabetic foot infection that led to a partial amputation of your right foot. Currently, you are on Ciprofloxacin  to control the infection, and your wound is healing well. Continue taking Ciprofloxacin  500 mg twice daily for the next 3 weeks and monitor the wound for any changes. Follow up with podiatry as scheduled.  -TYPE 2 DIABETES MELLITUS: Your blood sugar levels have been unstable recently. We have restarted metformin  and resumed Mounjaro  to help control your blood sugar and prevent further complications. We also checked your A1c today. Continue taking Lantus  and Farxiga  as prescribed.  -IRON  DEFICIENCY ANEMIA: You have low iron  levels, which is causing weakness and fatigue. You are receiving weekly iron  infusions to help improve your iron  levels. We checked your hemoglobin today to monitor your progress.  -HYPERTENSION: Your blood pressure has been low recently, but it has stabilized after stopping amlodipine . Continue taking losartan  25 mg daily and monitor your blood pressure regularly.  -HYPO-OSMOLALITY AND HYPONATREMIA: You have low sodium and chloride levels, which may be related to a condition called adrenal insufficiency. We have ordered a cortisol test with a dexamethasone  suppression test to investigate this further. Continue to monitor your sodium and chloride levels.  -THYROID  DISORDER: You have a thyroid  disorder and are taking levothyroxine  100 mcg daily. Your thyroid  function tests are normal, but we will continue to monitor for any symptoms of over-suppression.  -CONSTIPATION: You have been  experiencing constipation, likely due to medication side effects and reduced mobility. Continue taking Miralax  daily and add Colace if your stools remain hard.  -NEURALGIA AND NEUROPATHY: You have chronic pain and numbness in your fingers, which may be related to neuropathy or Raynaud's phenomenon. Consider using Raynaud's gloves for symptom relief.  -PRESSURE ULCER: You have a stage 2 pressure ulcer on your buttock, likely due to prolonged bed rest. Continue with your current wound care regimen.  -GENERAL HEALTH MAINTENANCE: We reviewed your routine labs and found normal studies. Continue following a healthy lifestyle, including a balanced diet and regular exercise. We also checked your B12 levels today and will consider B vitamin supplementation if needed.  INSTRUCTIONS: Please follow up with podiatry as scheduled and continue monitoring your blood pressure and blood sugar levels regularly. We will contact you with the results of your cortisol test and B12 levels. If you experience any new symptoms or have concerns, please contact ou r office.     Take dexamethasone  tablet night prior to am lab draws Take at night between 11 pm and 12 am.  Am lab schedule around 8 am best you can.   Restart metformin    Continue thyroid  medication for now.   Continue to remain off of amlodipine  and monitor blood pressure                   Contains text generated by [employer]bridge.                                 Contains text generated by [employer]bridge.

## 2024-06-16 NOTE — Progress Notes (Signed)
 Established Patient Office Visit  Subjective:      CC:  Chief Complaint  Patient presents with   Acute Visit    Blood pressure follow up, has TOC appt with Dr. Cleatus in February     HPI: [name] is a 68 y.o. male presenting on 06/16/2024 for Acute Visit (Blood pressure follow up, has TOC appt with Dr. Cleatus in February ) .  Discussed the use of AI scribe software for clinical note transcription with the patient, who gave verbal consent to proceed.  History of Present Illness [name] is a 68 year old male with hypertension and diabetes who presents with low blood pressure and weakness.  He has experienced significant weakness and low blood pressure for the past couple of weeks, with home blood pressure readings as low as 90/50 mmHg, leading to shortness of breath. After discontinuing amlodipine , his blood pressure normalized to around 126/80 mmHg. He was previously on 5 mg of amlodipine .  He has a long-standing history of diabetes, managed with metformin  for over 20 years. Recently, his blood sugar levels have stabilized between 110 to 160 mg/dL, an improvement from previous readings over 200 mg/dL. He stopped metformin  for a week to see if it would alleviate pain in his fingers, described as 'frozen and numb.' But it did not.  He is receiving iron  infusions due to low iron  levels and persistent weakness, with his third infusion scheduled today. Despite this, he continues to feel weak and has difficulty maintaining balance, experiencing falls due to low blood pressure. His hemoglobin was low.  He has a history of a diabetic foot infection, resulting in a transmetatarsal amputation on the right foot. He is followed by outpatient wound care and podiatry. An MRI on October 29th showed a right foot abscess and tenosynovitis. He underwent surgery for irrigation, drainage, and bone excision, and was discharged on antibiotics including Duricef and Ciprofloxacin .  He  experiences constipation, which was severe over the weekend. He has not been taking a laxative consistently. He is also on Mounjaro , which can contribute to constipation, and has been less active due to weakness.  He has experienced pain and a sensation of coldness in his fingers for about two years, worsening over time. The pain is not relieved by gabapentin , and he describes his fingers as feeling 'frozen.' Heating pads have not provided relief.  He has a history of hyperlipidemia, hypertension, psoriasis, and peripheral vascular disease. He is currently on levothyroxine  100 mcg for a newly diagnosed thyroid  issue, taken first thing in the morning.  He has a history of alcohol  use but stopped drinking on May 10, 2023. He consumes a significant amount of Diet Coke daily    Wt Readings from Last 3 Encounters:  06/16/24 210 lb 9.6 oz (95.5 kg)  06/05/24 211 lb 14.4 oz (96.1 kg)  05/23/24 220 lb (99.8 kg)   Temp Readings from Last 3 Encounters:  06/16/24 (!) 97 F (36.1 C) (Tympanic)  06/16/24 98.5 F (36.9 C) (Temporal)  06/10/24 (!) 96.9 F (36.1 C) (Tympanic)   BP Readings from Last 3 Encounters:  06/16/24 (!) 156/71  06/16/24 126/80  06/10/24 130/70   Pulse Readings from Last 3 Encounters:  06/16/24 72  06/16/24 87  06/10/24 73   Last metabolic panel Lab Results  Component Value Date   GLUCOSE 182 (H) 06/10/2024   NA 130 (L) 06/10/2024   K 4.1 06/10/2024   CL 87 (L) 06/10/2024   CO2 31  06/10/2024   BUN 26 (H) 06/10/2024   CREATININE 1.50 (H) 06/10/2024   GFRNONAA 50 (L) 06/10/2024   CALCIUM 9.3 06/10/2024   PHOS 4.8 (H) 06/06/2023   PROT 6.8 05/21/2024   ALBUMIN 2.7 (L) 05/21/2024   LABGLOB 2.3 09/06/2023   AGRATIO 2.2 12/20/2022   BILITOT 0.9 05/21/2024   ALKPHOS 211 (H) 05/21/2024   AST 50 (H) 05/21/2024   ALT 56 (H) 05/21/2024   ANIONGAP 12 06/10/2024   Lab Results  Component Value Date   TSH 1.39 06/02/2024           Social  history:  Relevant past medical, surgical, family and social history reviewed and updated as indicated. Interim medical history since our last visit reviewed.  Allergies and medications reviewed and updated.  DATA REVIEWED: CHART IN EPIC     ROS: Negative unless specifically indicated above in HPI.    Current Outpatient Medications:    Alcohol  Swabs  (ALCOHOL  PREP) 70 % PADS, USE 1 PREP PAD TWICE DAILY, Disp: 200 each, Rfl: 3   Alpha-Lipoic Acid 600 MG TABS, Take by mouth. 3 in am and 1 in pm, Disp: , Rfl:    CALCIUM-MAGNESIUM -ZINC  PO, Take by mouth., Disp: , Rfl:    cefadroxil  (DURICEF) 500 MG capsule, Take 500 mg by mouth., Disp: , Rfl:    Cinnamon 500 MG capsule, Take 500 mg by mouth 2 (two) times daily., Disp: , Rfl:    ciprofloxacin  (CIPRO ) 500 MG tablet, Take 1 tablet (500 mg total) by mouth 2 (two) times daily for 21 days., Disp: 42 tablet, Rfl: 0   clobetasol cream (TEMOVATE) 0.05 %, Apply 1 Application topically 2 (two) times daily., Disp: , Rfl:    CONTOUR NEXT TEST test strip, USE 1 TO 2 TIMES DAILY TO CHECK  BLOOD SUGAR, Disp: 200 strip, Rfl: 3   cyanocobalamin  (VITAMIN B12) 1000 MCG/ML injection, 1 ml injection-once a month., Disp: 12 mL, Rfl: 1   dapagliflozin  propanediol (FARXIGA ) 5 MG TABS tablet, Take 1 tablet (5 mg total) by mouth daily., Disp: 90 tablet, Rfl: 3   dexamethasone  (DECADRON ) 1 MG tablet, Take one po at night time between 11 pm and 12 am night prior to am labs, Disp: 1 tablet, Rfl: 0   DULoxetine  (CYMBALTA ) 60 MG capsule, Take 2 capsules (120 mg total) by mouth daily., Disp: 180 capsule, Rfl: 3   finasteride  (PROSCAR ) 5 MG tablet, Take 1 tablet (5 mg total) by mouth daily., Disp: 90 tablet, Rfl: 0   furosemide  (LASIX ) 20 MG tablet, Take 1 tablet (20 mg total) by mouth daily as needed., Disp: 90 tablet, Rfl: 0   gabapentin  (NEURONTIN ) 400 MG capsule, Take 2 capsules (800 mg total) by mouth 3 (three) times daily., Disp: 540 capsule, Rfl: 3   hydrocortisone   valerate ointment (WEST-CORT) 0.2 %, APPLY TOPICALLY TO AFFECTED  AREA(S) TWICE DAILY, Disp: 120 g, Rfl: 1   hydrOXYzine  (VISTARIL ) 25 MG capsule, TAKE 1 CAPSULE(25 MG) BY MOUTH EVERY 8 HOURS AS NEEDED FOR ITCHING, Disp: 60 capsule, Rfl: 3   insulin  glargine (LANTUS  SOLOSTAR) 100 UNIT/ML Solostar Pen, Inject 24 Units into the skin daily., Disp: 1 mL, Rfl: 3   Insulin  Pen Needle (B-D UF III MINI PEN NEEDLES) 31G X 5 MM MISC, USE AS DIRECTED DAILY, Disp: 90 each, Rfl: 3   losartan  (COZAAR ) 25 MG tablet, TAKE 1 TABLET(25 MG) BY MOUTH DAILY, Disp: 90 tablet, Rfl: 3   metFORMIN  (GLUCOPHAGE ) 1000 MG tablet, Take 1 tablet (1,000 mg total)  by mouth 2 (two) times daily with a meal., Disp: 180 tablet, Rfl: 3   montelukast  (SINGULAIR ) 10 MG tablet, Take 10 mg by mouth daily., Disp: , Rfl:    Multiple Vitamin (MULTIVITAMIN WITH MINERALS) TABS tablet, Take 1 tablet by mouth daily., Disp: 30 tablet, Rfl: 2   Oxycodone  HCl 10 MG TABS, Take 1 tablet by mouth 5 (five) times daily., Disp: , Rfl:    pantoprazole  (PROTONIX ) 40 MG tablet, Take 1 tablet (40 mg total) by mouth daily., Disp: 90 tablet, Rfl: 1   simvastatin  (ZOCOR ) 40 MG tablet, Take 1 tablet (40 mg total) by mouth at bedtime., Disp: 90 tablet, Rfl: 0   SYRINGE-NEEDLE, DISP, 3 ML (BD SAFETYGLIDE SYRINGE/NEEDLE) 25G X 1 3 ML MISC, Use the needle for IM injection once a  month; or as directed., Disp: 30 each, Rfl: 1   tamsulosin  (FLOMAX ) 0.4 MG CAPS capsule, Take 1 capsule (0.4 mg total) by mouth daily., Disp: 90 capsule, Rfl: 0   tirzepatide  (MOUNJARO ) 5 MG/0.5ML Pen, Inject 5 mg into the skin once a week., Disp: 2 mL, Rfl: 5   triamcinolone  lotion (KENALOG ) 0.1 %, APPLY TO AFFECTED AREA(S)  TOPICALLY 3 TIMES DAILY, Disp: 180 mL, Rfl: 1   levothyroxine  (SYNTHROID ) 100 MCG tablet, Take 1 tablet (100 mcg total) by mouth daily at 6 (six) AM., Disp: 30 tablet, Rfl: 0        Objective:        BP 126/80 (BP Location: Left Arm, Patient Position: Sitting,  Cuff Size: Large)   Pulse 87   Temp 98.5 F (36.9 C) (Temporal)   Wt 210 lb 9.6 oz (95.5 kg)   SpO2 99%   BMI 28.56 kg/m   Physical Exam VITALS: BP- 126/80 MEASUREMENTS: Weight- 210. CHEST: Lungs clear to auscultation bilaterally. CARDIOVASCULAR: Heart sounds normal, no murmur. MUSCULOSKELETAL: Knobby prominences with arthritis in hands.  Wt Readings from Last 3 Encounters:  06/16/24 210 lb 9.6 oz (95.5 kg)  06/05/24 211 lb 14.4 oz (96.1 kg)  05/23/24 220 lb (99.8 kg)    Physical Exam Constitutional:      General: He is not in acute distress.    Appearance: Normal appearance. He is normal weight. He is not ill-appearing, toxic-appearing or diaphoretic.  Cardiovascular:     Rate and Rhythm: Normal rate and regular rhythm.  Pulmonary:     Effort: Pulmonary effort is normal.     Breath sounds: Normal breath sounds.  Musculoskeletal:        General: Normal range of motion.     Left lower leg: 1+ Edema present.  Neurological:     General: No focal deficit present.     Mental Status: He is alert and oriented to person, place, and time. Mental status is at baseline.  Psychiatric:        Mood and Affect: Mood normal.        Behavior: Behavior normal.        Thought Content: Thought content normal.        Judgment: Judgment normal.          Results LABS Hemoglobin: Low C-reactive protein: High (06/10/2024) Erythrocyte sedimentation rate: High (06/10/2024) Creatinine: Slightly elevated Potassium: Slightly high Platelets: Elevated  RADIOLOGY MRI right foot: Abscess, tenosynovitis, no osteomyelitis (05/21/2024)  Assessment & Plan:   Assessment and Plan Assessment & Plan Diabetic foot infection, status post right transmetatarsal amputation with peripheral vascular disease Status post right transmetatarsal amputation due to diabetic foot infection. Peripheral vascular disease  present. Recent hospitalization for infection management. Currently on Ciprofloxacin  for  infection control. Wound healing well with no active drainage. - Continue Ciprofloxacin  500 mg twice daily for 3 more weeks - Monitor wound healing and drainage with wound care and podiatry - Follow up with podiatry as scheduled  Type 2 diabetes mellitus with medication management and recent glycemic instability Recent glycemic instability with blood sugars ranging from 110 to 160 mg/dL. Metformin  was stopped due to concerns about low blood sugar, but blood sugars have stabilized. Mounjaro  was paused due to adverse effects but should be resumed to prevent hyperglycemia and aid in infection control. - Restart metformin  - Resume Mounjaro  - Checke A1c today - Continue Lantus  and Farxiga  -monitor blood pressure levels daily, keep log to bring in for f/u with pcp x one week -report any <100  Iron  deficiency anemia, receiving iron  infusions Iron  deficiency anemia with ongoing iron  infusions. Recent transfusion and iron  supplementation. Hemoglobin levels to be rechecked today. Weakness and fatigue likely related to anemia. - Continue weekly iron  infusions with hematology - Checked hemoglobin today  Hypertension, recently adjusted antihypertensive regimen Hypertension previously managed with amlodipine , which was stopped due to hypotension. Blood pressure has stabilized with losartan . Current blood pressure readings are within normal range. - Continue losartan  25 mg daily - Monitor blood pressure regularly  Hypo-osmolality and hyponatremia, evaluation for adrenal insufficiency Hypo-osmolality and hyponatremia with low sodium and chloride levels. Evaluation for primary adrenal insufficiency due to persistent low sodium levels. Cortisol test requested by infectious disease specialist to rule out adrenal insufficiency. - Ordered cortisol test with dexamethasone  suppression test - Continue to monitor sodium and chloride levels  Thyroid  disorder, on levothyroxine  Recently diagnosed thyroid  disorder  with initiation of levothyroxine  100 mcg daily. Thyroid  function tests were normal after initiation. Monitoring for symptoms of over-suppression. - Continue levothyroxine  100 mcg daily - Monitor for symptoms of over-suppression -tsh ordered today to monitor for over suppression   Constipation, multifactorial Constipation likely multifactorial, including medication side effects and reduced mobility. Recent improvement noted after reduction of pain medications. - Continue Miralax  daily - Add Colace if stools remain hard  Neuralgia and neuropathy, fingers Chronic neuralgia and neuropathy in fingers with symptoms of numbness and cold sensation. Gabapentin  not effective for symptom relief. Symptoms may be related to neuropathy or Raynaud's phenomenon.   Pressure ulcer, stage 2, buttock Stage 2 pressure ulcer on buttock, likely due to prolonged bed rest during hospitalization. - Continue current wound care regimen.  Other specified abnormal findings of blood chemistry and elevated inflammatory markers Elevated inflammatory markers noted. Infectious disease specialist involved in management. Cortisol test requested to evaluate for adrenal insufficiency. - Ordered ANA reflex and rheumatoid factor tests - Continue to monitor inflammatory markers  Deficiency of B group vitamins Potential deficiency of B group vitamins contributing to neuropathy symptoms. - Checked B12 levels - Will consider B vitamin supplementation if levels are low    Total time reviewing hospital visit, old encounters, evaluating labs and assessing for treatment plan, medications and lab orders totaled 46 min in clinic time.     Return for f/u as scheduled with PCP in one week.     Ginger Patrick, MSN, APRN, FNP-C [facility]

## 2024-06-17 ENCOUNTER — Other Ambulatory Visit

## 2024-06-17 ENCOUNTER — Other Ambulatory Visit: Payer: Self-pay

## 2024-06-17 DIAGNOSIS — R200 Anesthesia of skin: Secondary | ICD-10-CM | POA: Diagnosis not present

## 2024-06-17 DIAGNOSIS — M792 Neuralgia and neuritis, unspecified: Secondary | ICD-10-CM | POA: Diagnosis not present

## 2024-06-17 DIAGNOSIS — R972 Elevated prostate specific antigen [PSA]: Secondary | ICD-10-CM

## 2024-06-17 DIAGNOSIS — Z79899 Other long term (current) drug therapy: Secondary | ICD-10-CM | POA: Diagnosis not present

## 2024-06-17 LAB — RHEUMATOID FACTOR: Rheumatoid fact SerPl-aCnc: <10 [IU]/mL (ref <14)

## 2024-06-18 LAB — ANA W/REFLEX: ANA Titer 1: NEGATIVE

## 2024-06-18 LAB — PSA: Prostate Specific Ag, Serum: 1 ng/mL (ref 0.0–4.0)

## 2024-06-23 ENCOUNTER — Ambulatory Visit: Payer: Self-pay | Admitting: Family

## 2024-06-23 ENCOUNTER — Inpatient Hospital Stay

## 2024-06-23 ENCOUNTER — Ambulatory Visit: Admitting: Family Medicine

## 2024-06-23 ENCOUNTER — Encounter: Payer: Self-pay | Admitting: Family Medicine

## 2024-06-23 ENCOUNTER — Inpatient Hospital Stay: Attending: Nurse Practitioner

## 2024-06-23 ENCOUNTER — Other Ambulatory Visit (INDEPENDENT_AMBULATORY_CARE_PROVIDER_SITE_OTHER)

## 2024-06-23 VITALS — BP 110/58 | HR 86 | Temp 97.7°F | Ht 72.0 in | Wt 207.4 lb

## 2024-06-23 VITALS — BP 119/55 | HR 71 | Temp 97.6°F

## 2024-06-23 DIAGNOSIS — E871 Hypo-osmolality and hyponatremia: Secondary | ICD-10-CM | POA: Diagnosis not present

## 2024-06-23 DIAGNOSIS — E11621 Type 2 diabetes mellitus with foot ulcer: Secondary | ICD-10-CM | POA: Diagnosis not present

## 2024-06-23 DIAGNOSIS — L97509 Non-pressure chronic ulcer of other part of unspecified foot with unspecified severity: Secondary | ICD-10-CM

## 2024-06-23 DIAGNOSIS — R7989 Other specified abnormal findings of blood chemistry: Secondary | ICD-10-CM | POA: Diagnosis not present

## 2024-06-23 DIAGNOSIS — D649 Anemia, unspecified: Secondary | ICD-10-CM | POA: Diagnosis present

## 2024-06-23 DIAGNOSIS — R12 Heartburn: Secondary | ICD-10-CM | POA: Insufficient documentation

## 2024-06-23 DIAGNOSIS — D638 Anemia in other chronic diseases classified elsewhere: Secondary | ICD-10-CM | POA: Insufficient documentation

## 2024-06-23 DIAGNOSIS — Z79899 Other long term (current) drug therapy: Secondary | ICD-10-CM | POA: Insufficient documentation

## 2024-06-23 DIAGNOSIS — I10 Essential (primary) hypertension: Secondary | ICD-10-CM | POA: Diagnosis not present

## 2024-06-23 DIAGNOSIS — E1169 Type 2 diabetes mellitus with other specified complication: Secondary | ICD-10-CM

## 2024-06-23 DIAGNOSIS — M869 Osteomyelitis, unspecified: Secondary | ICD-10-CM

## 2024-06-23 DIAGNOSIS — D509 Iron deficiency anemia, unspecified: Secondary | ICD-10-CM

## 2024-06-23 DIAGNOSIS — R209 Unspecified disturbances of skin sensation: Secondary | ICD-10-CM

## 2024-06-23 DIAGNOSIS — R5383 Other fatigue: Secondary | ICD-10-CM | POA: Insufficient documentation

## 2024-06-23 DIAGNOSIS — Z23 Encounter for immunization: Secondary | ICD-10-CM

## 2024-06-23 MED ORDER — HYDROCODONE-ACETAMINOPHEN 10-325 MG PO TABS: 1.5000 | ORAL_TABLET | ORAL | Status: AC | PRN

## 2024-06-23 MED ORDER — LANTUS SOLOSTAR 100 UNIT/ML ~~LOC~~ SOPN: 22.0000 [IU] | PEN_INJECTOR | Freq: Every day | SUBCUTANEOUS | Status: DC

## 2024-06-23 MED ORDER — IRON SUCROSE 20 MG/ML IV SOLN
200.0000 mg | Freq: Once | INTRAVENOUS | Status: AC
  Administered 2024-06-23: 200 mg via INTRAVENOUS
  Filled 2024-06-23: qty 10

## 2024-06-23 NOTE — Progress Notes (Signed)
 Patient tolerated Venofer  infusion well. Explained recommendation of 30 min post monitoring. Patient refused to wait post monitoring. Educated on what signs to watch for & to call with any concerns. No questions, discharged. Stable

## 2024-06-23 NOTE — Progress Notes (Unsigned)
 Still on cipro  per ID with f/u pending.    Hypo-osmolality and hyponatremia, evaluation for adrenal insufficiency pending.    H/o B12 def off replacement.  Most recent level wnl.    A1c improved to 7.1    Taking hydrocodone  per Dr. Laqueta.  Tried heating pads.  Hands feel warm to others but feel cold to him.  Tingling sensation.  Gabapentin  helps some but he is out of med currently.  D/w pt about checking with pharmacy about getting next refill.    Diabetic foot infection, status post right transmetatarsal amputation with peripheral vascular disease Status post right transmetatarsal amputation due to diabetic foot infection. Peripheral vascular disease present. Recent hospitalization for infection management. Currently on Ciprofloxacin  for infection control. Wound healing well with no active drainage.  More pain after carpal tunnel syndrome.   Stitches to be removed this week.    Buttock lesions healed.   IDA per hematology.  Had infusion today.  Has GI f/u pending.

## 2024-06-23 NOTE — Patient Instructions (Addendum)
 Let me check back on your old records.  Please ask the pharmacy about restarting gabapentin .  I'll check with Dr. Laqueta about options and I'll await your follow up labs.  Take care.  Glad to see you.

## 2024-06-23 NOTE — Patient Instructions (Signed)

## 2024-06-24 ENCOUNTER — Ambulatory Visit: Attending: Infectious Diseases | Admitting: Infectious Diseases

## 2024-06-24 ENCOUNTER — Ambulatory Visit: Payer: Self-pay | Admitting: Physician Assistant

## 2024-06-24 ENCOUNTER — Encounter: Payer: Self-pay | Admitting: Infectious Diseases

## 2024-06-24 VITALS — BP 80/48 | HR 110 | Ht 72.0 in | Wt 208.0 lb

## 2024-06-24 VITALS — BP 130/75 | HR 113 | Temp 96.8°F

## 2024-06-24 DIAGNOSIS — L8990 Pressure ulcer of unspecified site, unspecified stage: Secondary | ICD-10-CM | POA: Insufficient documentation

## 2024-06-24 DIAGNOSIS — Z79890 Hormone replacement therapy (postmenopausal): Secondary | ICD-10-CM | POA: Insufficient documentation

## 2024-06-24 DIAGNOSIS — E1169 Type 2 diabetes mellitus with other specified complication: Secondary | ICD-10-CM | POA: Insufficient documentation

## 2024-06-24 DIAGNOSIS — E11621 Type 2 diabetes mellitus with foot ulcer: Secondary | ICD-10-CM | POA: Diagnosis not present

## 2024-06-24 DIAGNOSIS — Z7984 Long term (current) use of oral hypoglycemic drugs: Secondary | ICD-10-CM | POA: Diagnosis not present

## 2024-06-24 DIAGNOSIS — M86171 Other acute osteomyelitis, right ankle and foot: Secondary | ICD-10-CM | POA: Diagnosis not present

## 2024-06-24 DIAGNOSIS — B9620 Unspecified Escherichia coli [E. coli] as the cause of diseases classified elsewhere: Secondary | ICD-10-CM | POA: Diagnosis not present

## 2024-06-24 DIAGNOSIS — L97519 Non-pressure chronic ulcer of other part of right foot with unspecified severity: Secondary | ICD-10-CM | POA: Insufficient documentation

## 2024-06-24 DIAGNOSIS — N401 Enlarged prostate with lower urinary tract symptoms: Secondary | ICD-10-CM | POA: Diagnosis not present

## 2024-06-24 DIAGNOSIS — Z79899 Other long term (current) drug therapy: Secondary | ICD-10-CM | POA: Insufficient documentation

## 2024-06-24 DIAGNOSIS — Z7985 Long-term (current) use of injectable non-insulin antidiabetic drugs: Secondary | ICD-10-CM | POA: Diagnosis not present

## 2024-06-24 DIAGNOSIS — E039 Hypothyroidism, unspecified: Secondary | ICD-10-CM | POA: Insufficient documentation

## 2024-06-24 DIAGNOSIS — Z794 Long term (current) use of insulin: Secondary | ICD-10-CM | POA: Diagnosis not present

## 2024-06-24 DIAGNOSIS — D631 Anemia in chronic kidney disease: Secondary | ICD-10-CM | POA: Insufficient documentation

## 2024-06-24 DIAGNOSIS — I129 Hypertensive chronic kidney disease with stage 1 through stage 4 chronic kidney disease, or unspecified chronic kidney disease: Secondary | ICD-10-CM | POA: Insufficient documentation

## 2024-06-24 DIAGNOSIS — N189 Chronic kidney disease, unspecified: Secondary | ICD-10-CM | POA: Insufficient documentation

## 2024-06-24 DIAGNOSIS — R3914 Feeling of incomplete bladder emptying: Secondary | ICD-10-CM | POA: Diagnosis not present

## 2024-06-24 DIAGNOSIS — E1122 Type 2 diabetes mellitus with diabetic chronic kidney disease: Secondary | ICD-10-CM | POA: Insufficient documentation

## 2024-06-24 DIAGNOSIS — L089 Local infection of the skin and subcutaneous tissue, unspecified: Secondary | ICD-10-CM | POA: Diagnosis not present

## 2024-06-24 DIAGNOSIS — I951 Orthostatic hypotension: Secondary | ICD-10-CM | POA: Diagnosis not present

## 2024-06-24 MED ORDER — FINASTERIDE 5 MG PO TABS: 5.0000 mg | ORAL_TABLET | Freq: Every day | ORAL | 3 refills | Status: AC

## 2024-06-24 MED ORDER — TAMSULOSIN HCL 0.4 MG PO CAPS: 0.4000 mg | ORAL_CAPSULE | Freq: Every day | ORAL | 3 refills | Status: AC

## 2024-06-24 NOTE — Patient Instructions (Addendum)
 You are on cipro  day 33- continue for another week for the rt foot infection- BP lying 137/64 HR 92, standing 82/51 HR 106.you are on losartan -I communicated with your PCP and he told you to take half the losartan  Please follow up with him Please follow with Dr.Fowler

## 2024-06-24 NOTE — Progress Notes (Signed)
 NAME: [name]  DOB: 03-Dec-1955  MRN: [id number]  Date/Time: 06/24/2024 10:53 AM   Subjective:  I last saw him on 11/18 Here with his wife For rt foot infection Had gone to urology this morning and they asked him to go to ED because of low BP and high pulse but he was totally asymptomatc - he came to my office  for routine follow up Last seen 11/18 On cipro  day 30 The rt foot is better but not healed completely  Following taken from last note [name] is a 68 year old male with a history  DM, Left THR, HTN  foot infections and amputations who presents for evaluation of a post-surgical foot infection. He is accompanied by his wife, [name]. He was referred by Dr. Ashley for evaluation of a post-surgical foot infection.  He has a history of foot infections, including a transmetatarsal amputation of his right toes seven years ago due to infection. Recently, he developed an ulcer on the bottom of his foot, which led to surgery on May 23, 2024. The surgery involved irrigation and debridement of an ulcer and abscess on the right foot, as well as excision of the second, third, and fourth metatarsal bones. He was hospitalized for five days post-surgery and received IV antibiotics, including vancomycin  and Zosyn .  Was discharged  ciprofloxacin  and cefadroxil , both 500 mg twice daily. He has been on ciprofloxacin  since May 23, 2024. Culture came back as enterobacter and ecoli His wife reports that the wound started to get red three days ago. He has been experiencing significant weakness and low blood pressure since returning home, with a recorded blood pressure of 78/58 mmHg yesterday. HIS PCP stopped amlodipine  He also has a bed sore from the hospital stay, which is being treated with Dermacloud.  He has a history of diabetes, managed with metformin , and his blood sugar levels have been stable between 100 and 200 mg/dL. He is also on multiple medications, including losartan  and  Farxiga , but amlodipine  was recently stopped due to low blood pressure. He has been experiencing significant fatigue and weakness, impacting his ability to perform daily activities.  His past medical history includes a left hip replacement and a recent diagnosis of hypothyroidism, for which he started Synthroid  in the hospital. He has a history of anemia and has been receiving iron  infusions at the cancer center. His hemoglobin was 9.3 g/dL on June 05, 2024. He has a history of high blood pressure and is on losartan , but his blood pressure has been low since the surgery.  As the pathology came back as acute osteomyelitis of the margin I saw the patient on 11/18 Stopped cefadroxil  HE has been doing okay During his last visit low BP, hyponatremia and I told his pcp to check cortisol which he got it done yesterday  Past Medical History:  Diagnosis Date   Adenomatous polyp    Allergy     Anemia    Arthritis    feet    Bunion 04/02/2013   STATUS POST OP BUN REPAIR   Diabetes mellitus    Foot ulcer (HCC)    GERD (gastroesophageal reflux disease)    Gout    Hammertoe    Hyperlipidemia    Hypertension    Neuromuscular disorder (HCC)    neuropathy   Plantar flexed metatarsal    Psoriasis    Schatzki's ring     Past Surgical History:  Procedure Laterality Date   ACHILLES TENDON LENGTHENING Right 05/23/2024  Procedure: LENGTHENING, TENDON, ACHILLES;  Surgeon: Ashley Soulier, DPM;  Location: [facility];  Service: Orthopedics/Podiatry;  Laterality: Right;   AMPUTATION TOE Left 05/08/2023   Procedure: AMPUTATION 5TH TOE WITH PARTIAL RAY RESECTION;  Surgeon: Neill Boas, DPM;  Location: [facility];  Service: Orthopedics/Podiatry;  Laterality: Left;   CARPAL TUNNEL RELEASE Left 08/24/2022   Procedure: CARPAL TUNNEL RELEASE;  Surgeon: Cleotilde Barrio, MD;  Location: [facility];  Service: Orthopedics;  Laterality: Left;   CATARACT EXTRACTION W/PHACO Left 03/21/2023   Procedure: CATARACT EXTRACTION  PHACO AND INTRAOCULAR LENS PLACEMENT (IOC) LEFT MALYUGIN DIABETIC 9.88 1:01.4;  Surgeon: Mittie Gaskin, MD;  Location: [facility];  Service: Ophthalmology;  Laterality: Left;   CATARACT EXTRACTION W/PHACO Right 04/04/2023   Procedure: CATARACT EXTRACTION PHACO AND INTRAOCULAR LENS PLACEMENT (IOC) RIGHT MALYUGIN DIABETIC 15.31 01:15.1;  Surgeon: Mittie Gaskin, MD;  Location: [facility];  Service: Ophthalmology;  Laterality: Right;   COLONOSCOPY     EYE SURGERY     Cataracts   FOOT AMPUTATION Right 01/11/2017   Dr Tisa ([facility])--transmetatarsal   FOOT SURGERY Right 02/28/2013   ALLEEN HAM TOE2,3 PINS ,2ND MET OSTEOTOMY, 5TH MET W/SCREW   02-2013, 04-2013   HIP FRACTURE SURGERY  1993   surgery x2   IR BONE MARROW BIOPSY & ASPIRATION  04/27/2023   IRRIGATION AND DEBRIDEMENT FOOT Right 05/23/2024   Procedure: IRRIGATION AND DEBRIDEMENT FOOT;  Surgeon: Ashley Soulier, DPM;  Location: [facility];  Service: Orthopedics/Podiatry;  Laterality: Right;   JOINT REPLACEMENT     LOWER EXTREMITY ANGIOGRAPHY Left 05/07/2023   Procedure: Lower Extremity Angiography;  Surgeon: Marea Selinda RAMAN, MD;  Location: [facility];  Service: Cardiovascular;  Laterality: Left;   NASAL SEPTOPLASTY W/ TURBINOPLASTY Bilateral 02/08/2023   Procedure: NASAL SEPTOPLASTY WITH TURBINATE REDUCTION;  Surgeon: Edda Mt, MD;  Location: [facility];  Service: ENT;  Laterality: Bilateral;   POLYPECTOMY     PROSTATE BIOPSY  10/10/2019   TOTAL HIP ARTHROPLASTY     Left hip replacement/femur fx 07/04, Screw removal left hip- [facility] 11/01    Social History   Socioeconomic History   Marital status: Married    Spouse name: Not on file   Number of children: 3   Years of education: Not on file   Highest education level: 12th grade  Occupational History   Occupation: PC ADMINISTRATOR    Employer: [employer]    Comment: Retired 3/25  Tobacco Use   Smoking status: Never    Passive  exposure: Past   Smokeless tobacco: Never  Vaping Use   Vaping status: Never Used  Substance and Sexual Activity   Alcohol  use: Yes    Alcohol /week: 1.0 standard drink of alcohol     Types: 1 Standard drinks or equivalent per week    Comment: occasional (DWI 1991)   Drug use: No   Sexual activity: Yes    Birth control/protection: None  Other Topics Concern   Not on file  Social History Narrative   alcohol  2-3 times a week; never smoked; lives in [place]. With wife; and 2 dog.        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     Social Drivers of Corporate Investment Banker Strain: Low Risk  (05/13/2024)   Received [facility]  Overall Financial Resource Strain (CARDIA)    Difficulty of Paying Living Expenses: Not hard at all  Food Insecurity: No Food Insecurity (05/21/2024)   Hunger Vital Sign    Worried About Running Out of Food in the Last Year: Never true    Ran Out of Food in the Last Year: Never true  Transportation Needs: No Transportation Needs (05/21/2024)   PRAPARE - Administrator, Civil Service (Medical): No    Lack of Transportation (Non-Medical): No  Physical Activity: Inactive (03/03/2024)   Exercise Vital Sign    Days of Exercise per Week: 0 days    Minutes of Exercise per Session: Not on file  Stress: No Stress Concern Present (03/03/2024)   [employer] of Occupational Health - Occupational Stress Questionnaire    Feeling of Stress: Only a little  Social Connections: Unknown (05/21/2024)   Social Connection and Isolation Panel    Frequency of Communication with Friends and Family: Twice a week    Frequency of Social Gatherings with Friends and Family: Patient declined    Attends Religious Services: More than 4 times per year    Active Member of [employer] or Organizations: No     Attends Engineer, Structural: Not on file    Marital Status: Married  Catering Manager Violence: Not At Risk (05/21/2024)   Humiliation, Afraid, Rape, and Kick questionnaire    Fear of Current or Ex-Partner: No    Emotionally Abused: No    Physically Abused: No    Sexually Abused: No    Family History  Problem Relation Age of Onset   Diabetes Mother    Early death Father    Diabetes Brother    Alzheimer's disease Maternal Aunt    Alzheimer's disease Cousin    Cancer Neg Hx    Colon cancer Neg Hx    Colon polyps Neg Hx    Rectal cancer Neg Hx    Stomach cancer Neg Hx    Esophageal cancer Neg Hx    Allergies  Allergen Reactions   Glipizide Other (See Comments)    REACTION: wt. gain, hands tingling   Lisinopril  Other (See Comments)    HYPERKALEMIA   Ramipril Cough   Ibuprofen     UNSPECIFIED REACTION    Lyrica  [Pregabalin ]     intolerant   Bactrim  [Sulfamethoxazole -Trimethoprim ] Nausea And Vomiting and Rash   I? Current Outpatient Medications  Medication Sig Dispense Refill   Alcohol  Swabs  (ALCOHOL  PREP) 70 % PADS USE 1 PREP PAD TWICE DAILY 200 each 3   Alpha-Lipoic Acid 600 MG TABS Take by mouth. 3 in am and 1 in pm     CALCIUM-MAGNESIUM -ZINC  PO Take by mouth.     Cinnamon 500 MG capsule Take 500 mg by mouth 2 (two) times daily.     ciprofloxacin  (CIPRO ) 500 MG tablet Take 1 tablet (500 mg total) by mouth 2 (two) times daily for 21 days. 42 tablet 0   clobetasol cream (TEMOVATE) 0.05 % Apply 1 Application topically 2 (two) times daily.     CONTOUR NEXT TEST test strip USE 1 TO 2 TIMES DAILY TO CHECK  BLOOD SUGAR 200 strip 3   cyanocobalamin  (VITAMIN B12) 1000 MCG/ML injection 1 ml injection-once a month. 12 mL 1   dapagliflozin  propanediol (FARXIGA ) 5 MG TABS tablet Take 1 tablet (5 mg total) by mouth daily. 90 tablet 3   DULoxetine  (CYMBALTA ) 60 MG capsule Take 2 capsules (120 mg total) by mouth daily. 180 capsule 3  finasteride  (PROSCAR ) 5 MG tablet Take 1  tablet (5 mg total) by mouth daily. 90 tablet 0   furosemide  (LASIX ) 20 MG tablet Take 1 tablet (20 mg total) by mouth daily as needed. 90 tablet 0   gabapentin  (NEURONTIN ) 400 MG capsule Take 2 capsules (800 mg total) by mouth 3 (three) times daily. 540 capsule 3   HYDROcodone -acetaminophen  (NORCO) 10-325 MG tablet Take 1.5 tablets by mouth every 4 (four) hours as needed.     hydrocortisone  valerate ointment (WEST-CORT) 0.2 % APPLY TOPICALLY TO AFFECTED  AREA(S) TWICE DAILY 120 g 1   hydrOXYzine  (VISTARIL ) 25 MG capsule TAKE 1 CAPSULE(25 MG) BY MOUTH EVERY 8 HOURS AS NEEDED FOR ITCHING 60 capsule 3   insulin  glargine (LANTUS  SOLOSTAR) 100 UNIT/ML Solostar Pen Inject 22 Units into the skin daily.     Insulin  Pen Needle (B-D UF III MINI PEN NEEDLES) 31G X 5 MM MISC USE AS DIRECTED DAILY 90 each 3   levothyroxine  (SYNTHROID ) 100 MCG tablet Take 1 tablet (100 mcg total) by mouth daily at 6 (six) AM. 30 tablet 0   losartan  (COZAAR ) 25 MG tablet TAKE 1 TABLET(25 MG) BY MOUTH DAILY 90 tablet 3   metFORMIN  (GLUCOPHAGE ) 1000 MG tablet Take 1 tablet (1,000 mg total) by mouth 2 (two) times daily with a meal. 180 tablet 3   montelukast  (SINGULAIR ) 10 MG tablet Take 10 mg by mouth daily.     Multiple Vitamin (MULTIVITAMIN WITH MINERALS) TABS tablet Take 1 tablet by mouth daily. 30 tablet 2   pantoprazole  (PROTONIX ) 40 MG tablet Take 1 tablet (40 mg total) by mouth daily. 90 tablet 1   simvastatin  (ZOCOR ) 40 MG tablet Take 1 tablet (40 mg total) by mouth at bedtime. 90 tablet 0   SYRINGE-NEEDLE, DISP, 3 ML (BD SAFETYGLIDE SYRINGE/NEEDLE) 25G X 1 3 ML MISC Use the needle for IM injection once a  month; or as directed. 30 each 1   tamsulosin  (FLOMAX ) 0.4 MG CAPS capsule Take 1 capsule (0.4 mg total) by mouth daily. 90 capsule 0   tirzepatide  (MOUNJARO ) 5 MG/0.5ML Pen Inject 5 mg into the skin once a week. 2 mL 5   triamcinolone  lotion (KENALOG ) 0.1 % APPLY TO AFFECTED AREA(S)  TOPICALLY 3 TIMES DAILY 180 mL 1    No current facility-administered medications for this visit.     Abtx:  Anti-infectives (From admission, onward)    None       REVIEW OF SYSTEMS:  Const: negative fever, negative chills, negative weight loss Eyes: negative diplopia or visual changes, negative eye pain ENT: negative coryza, negative sore throat Resp: negative cough, hemoptysis, dyspnea Cards: negative for chest pain, palpitations, lower extremity edema GU: negative for frequency, dysuria and hematuria GI: Negative for abdominal pain, diarrhea, bleeding, constipation Skin: negative for rash and pruritus Heme: negative for easy bruising and gum/nose bleeding MS:  weakness better Neurolo: dizziness, , memory problems  Psych: negative for feelings of anxiety, depression  Endocrine:hypothyroid, diabetes Allergy /Immunology- Bactrim  And other meds Objective:  VITALS:  BP 130/75   Pulse (!) 113   Temp (!) 96.8 F (36 C) (Temporal)   SpO2 97%   BP lying down 137/64 and HR 92 Sitting 94/56 and HR 100 Standing 82/51 HR 106 Pt asymptomatic PHYSICAL EXAM:  General: Alert, cooperative, no distress,  ambulating Head: Normocephalic, without obvious abnormality, atraumatic. Eyes: Conjunctivae clear, anicteric sclerae. Pupils are equal ENT Nares normal. No drainage or sinus tenderness. Lips, mucosa, and tongue normal. No Thrush Neck:  Supple, symmetrical, no adenopathy, thyroid : non tender no carotid bruit and no JVD. Back: No CVA tenderness. Lungs: Clear to auscultation bilaterally. No Wheezing or Rhonchi. No rales. Heart: Regular rate and rhythm, no murmur, rub or gallop. Abdomen: soft  Extremities: rt foot- dresisng removed   12/2      06/10/24-  TMA site healing - minimal maceration       06/10/24     Before surgery      Skin: No rashes or lesions. Or bruising Lymph: Cervical, supraclavicular normal. Neurologic: Grossly non-focal Pertinent Labs Lab Results CBC    Component  Value Date/Time   WBC 7.4 06/10/2024 1448   WBC 12.0 (H) 06/02/2024 1104   RBC 3.52 (L) 06/10/2024 1448   HGB 9.8 (L) 06/10/2024 1448   HGB 11.2 (L) 09/06/2023 0856   HCT 30.5 (L) 06/10/2024 1448   HCT 34.4 (L) 09/06/2023 0856   PLT 489 (H) 06/10/2024 1448   PLT 314 09/06/2023 0856   MCV 86.6 06/10/2024 1448   MCV 88 09/06/2023 0856   MCH 27.8 06/10/2024 1448   MCHC 32.1 06/10/2024 1448   RDW 15.1 06/10/2024 1448   RDW 12.8 09/06/2023 0856   LYMPHSABS 1.6 06/10/2024 1448   LYMPHSABS 1.3 06/06/2023 0831   MONOABS 0.6 06/10/2024 1448   EOSABS 0.6 (H) 06/10/2024 1448   EOSABS 0.4 06/06/2023 0831   BASOSABS 0.1 06/10/2024 1448   BASOSABS 0.0 06/06/2023 0831       Latest Ref Rng & Units 06/10/2024    2:47 PM 06/02/2024   11:04 AM 05/26/2024    7:08 PM  CMP  Glucose 70 - 99 mg/dL 817  794  839   BUN 8 - 23 mg/dL 26  18  17    Creatinine 0.61 - 1.24 mg/dL 8.49  8.72  8.85   Sodium 135 - 145 mmol/L 130  130  128   Potassium 3.5 - 5.1 mmol/L 4.1  5.2 No hemolysis seen  4.4   Chloride 98 - 111 mmol/L 87  93  94   CO2 22 - 32 mmol/L 31  27  25    Calcium 8.9 - 10.3 mg/dL 9.3  8.8  8.4       ? Impression/Recommendation ?Acute osteomyelitis of right foot post-surgical debridement at  transmetatarsal amputation site ulcer on October 31st, 2025. Cultures showed E. coli and Enterobacter, both sensitive to ciprofloxacin . Currently on ciprofloxacin  day 33 ,will continue for one more week and stop - Monitoring  for muscle pain, tendon pain, or ligament pain due to prolonged ciprofloxacin  use. Sed rate is high at 81 - But this could be due to Anemia also Follow up with  Dr.Fowler on Thursday  Postural hypotension -asymptomatic Indormed his PCP Dr.Duncan Lisinopril  to be halved He had cortisol drawn yesterday He will follow up with his PCP  Essential hypertension, currently managed with losartan  which is going to be reduced 50% because of postural Hypotension.   Iron  deficiency  anemia Recent transfusion and iron  supplementation. Hemoglobin was 9.3 g/dL on November 11th, 2025. Ongoing iron  supplementation with weekly infusions. Followed by hematologist Dr.B   Type 2 diabetes mellitus Type 2 diabetes mellitus, currently managed with metformin , Lantus , and Mounjaro . Blood glucose levels have been stable between 100-180 mg/dL. Recent weight loss noted, possibly related to Mounjaro . Decision made to continue Mounjaro  as it aids in diabetes management. - Continue current diabetes medications including Mounjaro . - Monitor blood glucose levels regularly.   Hypothyroidism Recently diagnosed hypothyroidism, currently on Synthroid  100 mcg  daily. Recent initiation of treatment may contribute to improved energy levels. - Continue Synthroid  100 mcg daily. - Monitor thyroid  function tests.    Chronic kidney disease Recent potassium level of 5.2 mmol/L. Monitoring required due to potential medication interactions and electrolyte imbalances. - Continue to monitor kidney function and electrolytes regularly.  ?).    __ Communicated with his PCP Follow up with PCP Follow up with Dr.Fowler ____________________________________________  Discussed the management in detail with patient and his wife Pt sent back to urology

## 2024-06-24 NOTE — Progress Notes (Signed)
 06/24/2024 10:05 AM   [name] May 18, 1956 [id number]  CC: Chief Complaint  Patient presents with   Elevated PSA   HPI: [name] is a 68 y.o. male with PMH BPH on Flomax  and finasteride  and fluctuating PSA with benign biopsy in 2021 who presents today for annual follow-up.   Today he reports no acute concerns.  His urinary symptoms are well-controlled on dual pharmacotherapy.  He wishes to continue this.  Most recent PSA remains stable at 1.0.  IPSS 4/pleased as below, same as prior.  Bladder scan 387 mL, last void 90 minutes prior.   IPSS     Row Name 06/24/24 0900         International Prostate Symptom Score   How often have you had the sensation of not emptying your bladder? Less than 1 in 5     How often have you had to urinate less than every two hours? Not at All     How often have you found you stopped and started again several times when you urinated? Less than 1 in 5 times     How often have you found it difficult to postpone urination? Not at All     How often have you had a weak urinary stream? Not at All     How often have you had to strain to start urination? Not at All     How many times did you typically get up at night to urinate? 2 Times     Total IPSS Score 4       Quality of Life due to urinary symptoms   If you were to spend the rest of your life with your urinary condition just the way it is now how would you feel about that? Pleased       PMH: Past Medical History:  Diagnosis Date   Adenomatous polyp    Allergy     Anemia    Arthritis    feet    Bunion 04/02/2013   STATUS POST OP BUN REPAIR   Diabetes mellitus    Foot ulcer (HCC)    GERD (gastroesophageal reflux disease)    Gout    Hammertoe    Hyperlipidemia    Hypertension    Neuromuscular disorder (HCC)    neuropathy   Plantar flexed metatarsal    Psoriasis    Schatzki's ring     Surgical History: Past Surgical History:  Procedure Laterality Date   ACHILLES TENDON  LENGTHENING Right 05/23/2024   Procedure: LENGTHENING, TENDON, ACHILLES;  Surgeon: Ashley Soulier, DPM;  Location: [facility];  Service: Orthopedics/Podiatry;  Laterality: Right;   AMPUTATION TOE Left 05/08/2023   Procedure: AMPUTATION 5TH TOE WITH PARTIAL RAY RESECTION;  Surgeon: Neill Boas, DPM;  Location: [facility];  Service: Orthopedics/Podiatry;  Laterality: Left;   CARPAL TUNNEL RELEASE Left 08/24/2022   Procedure: CARPAL TUNNEL RELEASE;  Surgeon: Cleotilde Barrio, MD;  Location: [facility];  Service: Orthopedics;  Laterality: Left;   CATARACT EXTRACTION W/PHACO Left 03/21/2023   Procedure: CATARACT EXTRACTION PHACO AND INTRAOCULAR LENS PLACEMENT (IOC) LEFT MALYUGIN DIABETIC 9.88 1:01.4;  Surgeon: Mittie Gaskin, MD;  Location: [facility];  Service: Ophthalmology;  Laterality: Left;   CATARACT EXTRACTION W/PHACO Right 04/04/2023   Procedure: CATARACT EXTRACTION PHACO AND INTRAOCULAR LENS PLACEMENT (IOC) RIGHT MALYUGIN DIABETIC 15.31 01:15.1;  Surgeon: Mittie Gaskin, MD;  Location: [facility];  Service: Ophthalmology;  Laterality: Right;   COLONOSCOPY     EYE SURGERY  Cataracts   FOOT AMPUTATION Right 01/11/2017   Dr Tisa ([facility])--transmetatarsal   FOOT SURGERY Right 02/28/2013   ALLEEN HAM TOE2,3 PINS ,2ND MET OSTEOTOMY, 5TH MET W/SCREW   [phone], 04-2013   HIP FRACTURE SURGERY  1993   surgery x2   IR BONE MARROW BIOPSY & ASPIRATION  04/27/2023   IRRIGATION AND DEBRIDEMENT FOOT Right 05/23/2024   Procedure: IRRIGATION AND DEBRIDEMENT FOOT;  Surgeon: Ashley Soulier, DPM;  Location: [facility];  Service: Orthopedics/Podiatry;  Laterality: Right;   JOINT REPLACEMENT     LOWER EXTREMITY ANGIOGRAPHY Left 05/07/2023   Procedure: Lower Extremity Angiography;  Surgeon: Marea Selinda RAMAN, MD;  Location: [facility];  Service: Cardiovascular;  Laterality: Left;   NASAL SEPTOPLASTY W/ TURBINOPLASTY Bilateral 02/08/2023   Procedure: NASAL SEPTOPLASTY WITH TURBINATE  REDUCTION;  Surgeon: Edda Mt, MD;  Location: [facility];  Service: ENT;  Laterality: Bilateral;   POLYPECTOMY     PROSTATE BIOPSY  10/10/2019   TOTAL HIP ARTHROPLASTY     Left hip replacement/femur fx 07/04, Screw removal left hip- [facility] 11/01    Home Medications:  Allergies as of 06/24/2024       Reactions   Glipizide Other (See Comments)   REACTION: wt. gain, hands tingling   Lisinopril  Other (See Comments)   HYPERKALEMIA   Ramipril Cough   Ibuprofen    UNSPECIFIED REACTION    Lyrica  [pregabalin ]    intolerant   Bactrim  [sulfamethoxazole -trimethoprim ] Nausea And Vomiting, Rash        Medication List        Accurate as of June 24, 2024 10:05 AM. If you have any questions, ask your nurse or doctor.          Alcohol  Prep 70 % Pads USE 1 PREP PAD TWICE DAILY   Alpha-Lipoic Acid 600 MG Tabs Take by mouth. 3 in am and 1 in pm   B-D UF III MINI PEN NEEDLES 31G X 5 MM Misc Generic drug: Insulin  Pen Needle USE AS DIRECTED DAILY   BD SafetyGlide Syringe/Needle 25G X 1 3 ML Misc Generic drug: SYRINGE-NEEDLE (DISP) 3 ML Use the needle for IM injection once a  month; or as directed.   CALCIUM-MAGNESIUM -ZINC  PO Take by mouth.   Cinnamon 500 MG capsule Take 500 mg by mouth 2 (two) times daily.   ciprofloxacin  500 MG tablet Commonly known as: CIPRO  Take 1 tablet (500 mg total) by mouth 2 (two) times daily for 21 days.   clobetasol cream 0.05 % Commonly known as: TEMOVATE Apply 1 Application topically 2 (two) times daily.   Contour Next Test test strip Generic drug: glucose blood USE 1 TO 2 TIMES DAILY TO CHECK  BLOOD SUGAR   cyanocobalamin  1000 MCG/ML injection Commonly known as: VITAMIN B12 1 ml injection-once a month.   dapagliflozin  propanediol 5 MG Tabs tablet Commonly known as: Farxiga  Take 1 tablet (5 mg total) by mouth daily.   DULoxetine  60 MG capsule Commonly known as: CYMBALTA  Take 2 capsules (120 mg total) by mouth daily.    finasteride  5 MG tablet Commonly known as: PROSCAR  Take 1 tablet (5 mg total) by mouth daily.   furosemide  20 MG tablet Commonly known as: LASIX  Take 1 tablet (20 mg total) by mouth daily as needed.   gabapentin  400 MG capsule Commonly known as: NEURONTIN  Take 2 capsules (800 mg total) by mouth 3 (three) times daily.   HYDROcodone -acetaminophen  10-325 MG tablet Commonly known as: NORCO Take 1.5 tablets by mouth every 4 (  four) hours as needed.   hydrocortisone  valerate ointment 0.2 % Commonly known as: WEST-CORT APPLY TOPICALLY TO AFFECTED  AREA(S) TWICE DAILY   hydrOXYzine  25 MG capsule Commonly known as: VISTARIL  TAKE 1 CAPSULE(25 MG) BY MOUTH EVERY 8 HOURS AS NEEDED FOR ITCHING   Lantus  SoloStar 100 UNIT/ML Solostar Pen Generic drug: insulin  glargine Inject 22 Units into the skin daily.   levothyroxine  100 MCG tablet Commonly known as: SYNTHROID  Take 1 tablet (100 mcg total) by mouth daily at 6 (six) AM.   losartan  25 MG tablet Commonly known as: COZAAR  TAKE 1 TABLET(25 MG) BY MOUTH DAILY   metFORMIN  1000 MG tablet Commonly known as: GLUCOPHAGE  Take 1 tablet (1,000 mg total) by mouth 2 (two) times daily with a meal.   montelukast  10 MG tablet Commonly known as: SINGULAIR  Take 10 mg by mouth daily.   multivitamin with minerals Tabs tablet Take 1 tablet by mouth daily.   pantoprazole  40 MG tablet Commonly known as: PROTONIX  Take 1 tablet (40 mg total) by mouth daily.   simvastatin  40 MG tablet Commonly known as: ZOCOR  Take 1 tablet (40 mg total) by mouth at bedtime.   tamsulosin  0.4 MG Caps capsule Commonly known as: FLOMAX  Take 1 capsule (0.4 mg total) by mouth daily.   tirzepatide  5 MG/0.5ML Pen Commonly known as: MOUNJARO  Inject 5 mg into the skin once a week.   triamcinolone  lotion 0.1 % Commonly known as: KENALOG  APPLY TO AFFECTED AREA(S)  TOPICALLY 3 TIMES DAILY        Allergies:  Allergies  Allergen Reactions   Glipizide Other (See  Comments)    REACTION: wt. gain, hands tingling   Lisinopril  Other (See Comments)    HYPERKALEMIA   Ramipril Cough   Ibuprofen     UNSPECIFIED REACTION    Lyrica  [Pregabalin ]     intolerant   Bactrim  [Sulfamethoxazole -Trimethoprim ] Nausea And Vomiting and Rash    Family History: Family History  Problem Relation Age of Onset   Diabetes Mother    Early death Father    Diabetes Brother    Alzheimer's disease Maternal Aunt    Alzheimer's disease Cousin    Cancer Neg Hx    Colon cancer Neg Hx    Colon polyps Neg Hx    Rectal cancer Neg Hx    Stomach cancer Neg Hx    Esophageal cancer Neg Hx     Social History:   reports that he has never smoked. He has been exposed to tobacco smoke. He has never used smokeless tobacco. He reports current alcohol  use of about 1.0 standard drink of alcohol  per week. He reports that he does not use drugs.  Physical Exam: BP (!) 80/48   Pulse (!) 110   Ht 6' (1.829 m)   Wt 208 lb (94.3 kg)   SpO2 97%   BMI 28.21 kg/m   Constitutional:  Alert and oriented, no acute distress, nontoxic appearing HEENT: [place], AT Cardiovascular: No clubbing, cyanosis, or edema Respiratory: Normal respiratory effort, no increased work of breathing DRE: Declined Skin: No rashes, bruises or suspicious lesions Neurologic: Grossly intact, no focal deficits, moving all 4 extremities Psychiatric: Normal mood and affect  Laboratory Data: Results for orders placed or performed in visit on 06/17/24  PSA   Collection Time: 06/17/24 10:17 AM  Result Value Ref Range   Prostate Specific Ag, Serum 1.0 0.0 - 4.0 ng/mL   Assessment & Plan:   1. Benign prostatic hyperplasia with incomplete bladder emptying (Primary) IPSS and  PSA stable.  DRE declined.  Bladder scan is somewhat elevated, though it had been 90 minutes since his last void, low suspicion for retention.  Will continue dual pharmacotherapy and see him back next year for follow-up. - Bladder Scan (Post Void  Residual) in office - tamsulosin  (FLOMAX ) 0.4 MG CAPS capsule; Take 1 capsule (0.4 mg total) by mouth daily.  Dispense: 90 capsule; Refill: 3 - finasteride  (PROSCAR ) 5 MG tablet; Take 1 tablet (5 mg total) by mouth daily.  Dispense: 90 tablet; Refill: 3   Return in about 1 year (around 06/24/2025) for Annual DRE/IPSS/PVR with PSA prior.  Lucie Hones, PA-C  [facility] [place] [address], [address] [place], [place] [address] [phone]

## 2024-06-25 ENCOUNTER — Telehealth: Payer: Self-pay | Admitting: Family Medicine

## 2024-06-25 LAB — CORTISOL-AM, BLOOD: Cortisol - AM: 17.5 ug/dL

## 2024-06-25 LAB — SODIUM, URINE, 24 HOUR
Sodium, 24H Ur: 154 mmol/(24.h) (ref 58–337)
Sodium, Ur: 67 mmol/L

## 2024-06-25 MED ORDER — LOSARTAN POTASSIUM 25 MG PO TABS: 12.5000 mg | ORAL_TABLET | Freq: Every day | ORAL | Status: AC

## 2024-06-25 NOTE — Telephone Encounter (Signed)
 Please send a copy of my most recent OV note to Dr. Laqueta with [facility].    Please call his clinic.  Unfortunately the patient is still having significant hand pain in spite of taking Cymbalta  and opiates.  I talked to the patient about trying to restart gabapentin .  The patient was asking about potentially trying amitriptyline but I did not start this because he is taking Cymbalta .  I wanted to make sure I was not complicating his situation.  I need input from Dr. Laqueta going forward.  Thanks.

## 2024-06-25 NOTE — Assessment & Plan Note (Signed)
 With ID clinic follow-up pending.  Will await that report.

## 2024-06-25 NOTE — Assessment & Plan Note (Signed)
 Per outside clinic.  Had infusion today.  Has GI follow-up pending.

## 2024-06-25 NOTE — Assessment & Plan Note (Signed)
 Cortisol and follow-up labs still pending.  No change in medications at the office visit but I was notified by infectious disease after the fact that he had follow-up low blood pressures.  I asked infectious disease to talk to patient about cutting losartan  in half.  Awaiting pending labs.

## 2024-06-25 NOTE — Assessment & Plan Note (Addendum)
 Suspect that his symptoms are related to neuropathy.  This could be related to previous alcohol  use versus diabetes versus others.  He has been taking hydrocodone  per outside clinic.  See following phone note.  He was asking about potential amitriptyline use.  We did not start that today.  He is currently treated with Cymbalta . Discussed restarting gabapentin .

## 2024-06-26 NOTE — Progress Notes (Unsigned)
 [name] [id number] 1955/12/06   Chief Complaint:  Referring Provider: Jimmy Charlie FERNS, MD Primary GI MD: Dr. Legrand   HPI: [name] is a 68 y.o. male with past medical history of anemia, diabetes with neuropathy, GERD, HLD, HTN, Schatzki ring, who presents today to discuss EGD.    06/28/2022 iron  studies normal, CBC with a hemoglobin slightly decreased to 12  07/13/2022 colonoscopy for personal history of adenomatous polyps on a colonoscopy in January 2018 with a diminutive polyp in the proximal ascending colon, diverticulosis in the left and right colon otherwise normal.  Pathology showed benign lymphoid aggregate.  Repeat recommended for surveillance in 10 years.   Last seen in office 10/12/2022 by Delon Failing, PA for dysphagia and GERD. Underwent EGD 11/02/2022 with findings of a widely patent Schatzki ring which was thought not to be contributing to his dysphagia.  Barium swallow which showed a small type I hiatal hernia, borderline esophageal motility dysfunction.  Advised to follow-up with ENT and possibly with allergist as well.  Patient's wife called 06/06/2024 stating patient had seen his oncologist and hemoglobin and red blood cell count were low, and that he was advised to have an EGD.  Patient follows with oncology and receives iron  infusions for iron  deficiency anemia.  Has had downtrending hemoglobin.  Per her last note from oncology, it was discussed that if he has recurrent drops in iron  stores he may need to consider a capsule study with GI.  Has had recent EGD and colonoscopy.  Labs 06/05/2024: Hemoglobin 9.3, MCV 86.1, iron  31, saturation ratio 12, ferritin 295  Discussed the use of AI scribe software for clinical note transcription with the patient, who gave verbal consent to proceed.  History of Present Illness       Previous GI Procedures/Imaging   EGD 11/02/2022 - Normal larynx.  - Widely patent Schatzki ring. Biopsied.  - Normal stomach.   - Normal examined duodenum.  The visualized Schatzki ring is causing minimal reduction of the esophageal diameter. Given that and patient' s reported symptoms, it is not felt likely to be contributing to dysphagia.  Colonoscopy 07/13/2022 - One diminutive polyp in the proximal ascending colon, removed with a cold snare. Resected and retrieved.  - Diverticulosis in the left colon and in the right colon.  - The examination was otherwise normal on direct and retroflexion views. - Recall 10 years Path: Surgical [P], colon, ascending, polyp (1) BENIGN COLONIC MUCOSA WITH LYMPHOID AGGREGATE  Past Medical History:  Diagnosis Date   Adenomatous polyp    Allergy     Anemia    Arthritis    feet    Bunion 04/02/2013   STATUS POST OP BUN REPAIR   Diabetes mellitus    Foot ulcer (HCC)    GERD (gastroesophageal reflux disease)    Gout    Hammertoe    Hyperlipidemia    Hypertension    Neuromuscular disorder (HCC)    neuropathy   Plantar flexed metatarsal    Psoriasis    Schatzki's ring     Past Surgical History:  Procedure Laterality Date   ACHILLES TENDON LENGTHENING Right 05/23/2024   Procedure: LENGTHENING, TENDON, ACHILLES;  Surgeon: Ashley Soulier, DPM;  Location: [facility];  Service: Orthopedics/Podiatry;  Laterality: Right;   AMPUTATION TOE Left 05/08/2023   Procedure: AMPUTATION 5TH TOE WITH PARTIAL RAY RESECTION;  Surgeon: Neill Boas, DPM;  Location: [facility];  Service: Orthopedics/Podiatry;  Laterality: Left;   CARPAL TUNNEL RELEASE Left 08/24/2022  Procedure: CARPAL TUNNEL RELEASE;  Surgeon: Cleotilde Barrio, MD;  Location: [facility];  Service: Orthopedics;  Laterality: Left;   CATARACT EXTRACTION W/PHACO Left 03/21/2023   Procedure: CATARACT EXTRACTION PHACO AND INTRAOCULAR LENS PLACEMENT (IOC) LEFT MALYUGIN DIABETIC 9.88 1:01.4;  Surgeon: Mittie Gaskin, MD;  Location: [facility];  Service: Ophthalmology;  Laterality: Left;   CATARACT EXTRACTION W/PHACO Right  04/04/2023   Procedure: CATARACT EXTRACTION PHACO AND INTRAOCULAR LENS PLACEMENT (IOC) RIGHT MALYUGIN DIABETIC 15.31 01:15.1;  Surgeon: Mittie Gaskin, MD;  Location: [facility];  Service: Ophthalmology;  Laterality: Right;   COLONOSCOPY     EYE SURGERY     Cataracts   FOOT AMPUTATION Right 01/11/2017   Dr Tisa ([facility])--transmetatarsal   FOOT SURGERY Right 02/28/2013   ALLEEN HAM TOE2,3 PINS ,2ND MET OSTEOTOMY, 5TH MET W/SCREW   02-2013, 04-2013   HIP FRACTURE SURGERY  1993   surgery x2   IR BONE MARROW BIOPSY & ASPIRATION  04/27/2023   IRRIGATION AND DEBRIDEMENT FOOT Right 05/23/2024   Procedure: IRRIGATION AND DEBRIDEMENT FOOT;  Surgeon: Ashley Soulier, DPM;  Location: [facility];  Service: Orthopedics/Podiatry;  Laterality: Right;   JOINT REPLACEMENT     LOWER EXTREMITY ANGIOGRAPHY Left 05/07/2023   Procedure: Lower Extremity Angiography;  Surgeon: Marea Selinda RAMAN, MD;  Location: [facility];  Service: Cardiovascular;  Laterality: Left;   NASAL SEPTOPLASTY W/ TURBINOPLASTY Bilateral 02/08/2023   Procedure: NASAL SEPTOPLASTY WITH TURBINATE REDUCTION;  Surgeon: Edda Mt, MD;  Location: [facility];  Service: ENT;  Laterality: Bilateral;   POLYPECTOMY     PROSTATE BIOPSY  10/10/2019   TOTAL HIP ARTHROPLASTY     Left hip replacement/femur fx 07/04, Screw removal left hip- [facility] 11/01    Current Outpatient Medications  Medication Sig Dispense Refill   Alcohol  Swabs  (ALCOHOL  PREP) 70 % PADS USE 1 PREP PAD TWICE DAILY 200 each 3   Alpha-Lipoic Acid 600 MG TABS Take by mouth. 3 in am and 1 in pm     CALCIUM-MAGNESIUM -ZINC  PO Take by mouth.     Cinnamon 500 MG capsule Take 500 mg by mouth 2 (two) times daily.     ciprofloxacin  (CIPRO ) 500 MG tablet Take 1 tablet (500 mg total) by mouth 2 (two) times daily for 21 days. 42 tablet 0   clobetasol cream (TEMOVATE) 0.05 % Apply 1 Application topically 2 (two) times daily.     CONTOUR NEXT TEST test strip USE 1 TO  2 TIMES DAILY TO CHECK  BLOOD SUGAR 200 strip 3   cyanocobalamin  (VITAMIN B12) 1000 MCG/ML injection 1 ml injection-once a month. 12 mL 1   dapagliflozin  propanediol (FARXIGA ) 5 MG TABS tablet Take 1 tablet (5 mg total) by mouth daily. 90 tablet 3   DULoxetine  (CYMBALTA ) 60 MG capsule Take 2 capsules (120 mg total) by mouth daily. 180 capsule 3   finasteride  (PROSCAR ) 5 MG tablet Take 1 tablet (5 mg total) by mouth daily. 90 tablet 3   furosemide  (LASIX ) 20 MG tablet Take 1 tablet (20 mg total) by mouth daily as needed. 90 tablet 0   gabapentin  (NEURONTIN ) 400 MG capsule Take 2 capsules (800 mg total) by mouth 3 (three) times daily. 540 capsule 3   HYDROcodone -acetaminophen  (NORCO) 10-325 MG tablet Take 1.5 tablets by mouth every 4 (four) hours as needed.     hydrocortisone  valerate ointment (WEST-CORT) 0.2 % APPLY TOPICALLY TO AFFECTED  AREA(S) TWICE DAILY 120 g 1   hydrOXYzine  (VISTARIL ) 25 MG capsule TAKE 1  CAPSULE(25 MG) BY MOUTH EVERY 8 HOURS AS NEEDED FOR ITCHING 60 capsule 3   insulin  glargine (LANTUS  SOLOSTAR) 100 UNIT/ML Solostar Pen Inject 22 Units into the skin daily.     Insulin  Pen Needle (B-D UF III MINI PEN NEEDLES) 31G X 5 MM MISC USE AS DIRECTED DAILY 90 each 3   levothyroxine  (SYNTHROID ) 100 MCG tablet Take 1 tablet (100 mcg total) by mouth daily at 6 (six) AM. 30 tablet 0   losartan  (COZAAR ) 25 MG tablet Take 0.5 tablets (12.5 mg total) by mouth daily.     metFORMIN  (GLUCOPHAGE ) 1000 MG tablet Take 1 tablet (1,000 mg total) by mouth 2 (two) times daily with a meal. 180 tablet 3   montelukast  (SINGULAIR ) 10 MG tablet Take 10 mg by mouth daily.     Multiple Vitamin (MULTIVITAMIN WITH MINERALS) TABS tablet Take 1 tablet by mouth daily. 30 tablet 2   pantoprazole  (PROTONIX ) 40 MG tablet Take 1 tablet (40 mg total) by mouth daily. 90 tablet 1   simvastatin  (ZOCOR ) 40 MG tablet Take 1 tablet (40 mg total) by mouth at bedtime. 90 tablet 0   SYRINGE-NEEDLE, DISP, 3 ML (BD SAFETYGLIDE  SYRINGE/NEEDLE) 25G X 1 3 ML MISC Use the needle for IM injection once a  month; or as directed. 30 each 1   tamsulosin  (FLOMAX ) 0.4 MG CAPS capsule Take 1 capsule (0.4 mg total) by mouth daily. 90 capsule 3   tirzepatide  (MOUNJARO ) 5 MG/0.5ML Pen Inject 5 mg into the skin once a week. 2 mL 5   triamcinolone  lotion (KENALOG ) 0.1 % APPLY TO AFFECTED AREA(S)  TOPICALLY 3 TIMES DAILY 180 mL 1   No current facility-administered medications for this visit.    Allergies as of 06/27/2024 - Review Complete 06/24/2024  Allergen Reaction Noted   Glipizide Other (See Comments) 09/03/2006   Lisinopril  Other (See Comments) 04/15/2014   Ramipril Cough 09/03/2006   Ibuprofen  09/03/2006   Lyrica  [pregabalin ]  06/23/2024   Bactrim  [sulfamethoxazole -trimethoprim ] Nausea And Vomiting and Rash 06/19/2022    Family History  Problem Relation Age of Onset   Diabetes Mother    Early death Father    Diabetes Brother    Alzheimer's disease Maternal Aunt    Alzheimer's disease Cousin    Cancer Neg Hx    Colon cancer Neg Hx    Colon polyps Neg Hx    Rectal cancer Neg Hx    Stomach cancer Neg Hx    Esophageal cancer Neg Hx     Social History   Tobacco Use   Smoking status: Never    Passive exposure: Past   Smokeless tobacco: Never  Vaping Use   Vaping status: Never Used  Substance Use Topics   Alcohol  use: Yes    Alcohol /week: 1.0 standard drink of alcohol     Types: 1 Standard drinks or equivalent per week    Comment: occasional (DWI 1991)   Drug use: No     Review of Systems:    Constitutional: No weight loss, fever, chills, weakness or fatigue Eyes: No change in vision Ears, Nose, Throat:  No change in hearing or congestion Skin: No rash or itching Cardiovascular: No chest pain, chest pressure or palpitations   Respiratory: No SOB or cough Gastrointestinal: See HPI and otherwise negative Genitourinary: No dysuria or change in urinary frequency Neurological: No headache, dizziness or  syncope Musculoskeletal: No new muscle or joint pain Hematologic: No bleeding or bruising    Physical Exam:  Vital signs: There  were no vitals taken for this visit.  Constitutional: NAD, Well developed, Well nourished, alert and cooperative Head:  Normocephalic and atraumatic.  Eyes: No scleral icterus. Conjunctiva pink. Mouth: No oral lesions. Respiratory: Respirations even and unlabored. Lungs clear to auscultation bilaterally.  No wheezes, crackles, or rhonchi.  Cardiovascular:  Regular rate and rhythm. No murmurs. No peripheral edema. Gastrointestinal:  Soft, nondistended, nontender. No rebound or guarding. Normal bowel sounds. No appreciable masses or hepatomegaly. Rectal:  Not performed.  Neurologic:  Alert and oriented x4;  grossly normal neurologically.  Skin:   Dry and intact without significant lesions or rashes. Psychiatric: Oriented to person, place and time. Demonstrates good judgement and reason without abnormal affect or behaviors.   RELEVANT LABS AND IMAGING: CBC    Component Value Date/Time   WBC 7.4 06/10/2024 1448   WBC 12.0 (H) 06/02/2024 1104   RBC 3.52 (L) 06/10/2024 1448   HGB 9.8 (L) 06/10/2024 1448   HGB 11.2 (L) 09/06/2023 0856   HCT 30.5 (L) 06/10/2024 1448   HCT 34.4 (L) 09/06/2023 0856   PLT 489 (H) 06/10/2024 1448   PLT 314 09/06/2023 0856   MCV 86.6 06/10/2024 1448   MCV 88 09/06/2023 0856   MCH 27.8 06/10/2024 1448   MCHC 32.1 06/10/2024 1448   RDW 15.1 06/10/2024 1448   RDW 12.8 09/06/2023 0856   LYMPHSABS 1.6 06/10/2024 1448   LYMPHSABS 1.3 06/06/2023 0831   MONOABS 0.6 06/10/2024 1448   EOSABS 0.6 (H) 06/10/2024 1448   EOSABS 0.4 06/06/2023 0831   BASOSABS 0.1 06/10/2024 1448   BASOSABS 0.0 06/06/2023 0831    CMP     Component Value Date/Time   NA 130 (L) 06/10/2024 1447   NA 137 09/06/2023 0856   K 4.1 06/10/2024 1447   CL 87 (L) 06/10/2024 1447   CO2 31 06/10/2024 1447   GLUCOSE 182 (H) 06/10/2024 1447   BUN 26 (H)  06/10/2024 1447   BUN 13 09/06/2023 0856   CREATININE 1.50 (H) 06/10/2024 1447   CREATININE 1.23 01/03/2013 1212   CALCIUM 9.3 06/10/2024 1447   PROT 6.8 05/21/2024 0803   PROT 6.1 09/06/2023 0856   ALBUMIN 2.7 (L) 05/21/2024 0803   ALBUMIN 3.8 (L) 09/06/2023 0856   AST 50 (H) 05/21/2024 0803   ALT 56 (H) 05/21/2024 0803   ALKPHOS 211 (H) 05/21/2024 0803   BILITOT 0.9 05/21/2024 0803   BILITOT 0.3 09/06/2023 0856   GFRNONAA 50 (L) 06/10/2024 1447   GFRNONAA >60 01/03/2013 1212   GFRAA 95 03/25/2020 0755   GFRAA >60 01/03/2013 1212     Assessment/Plan:   Assessment & Plan    Consider capsule endoscopy   Camie Furbish, PA-C [facility] 06/26/2024, 1:35 PM  Patient Care Team: Jimmy Charlie FERNS, MD as PCP - General Rennie Cindy SAUNDERS, MD as Consulting Physician (Oncology)

## 2024-06-27 ENCOUNTER — Encounter: Payer: Self-pay | Admitting: Gastroenterology

## 2024-06-27 ENCOUNTER — Ambulatory Visit: Admitting: Gastroenterology

## 2024-06-27 VITALS — BP 110/60 | HR 80 | Ht 71.0 in | Wt 202.5 lb

## 2024-06-27 DIAGNOSIS — D509 Iron deficiency anemia, unspecified: Secondary | ICD-10-CM

## 2024-06-27 NOTE — Patient Instructions (Addendum)
 CAPSULE ENDOSCOPY PATIENT INSTRUCTION [name] February 10, 1956 [id number]   07/08/2024 Seven (7) days prior to capsule endoscopy stop taking iron  supplements and carafate.  2. 07/08/2024 Seven (7) days prior to capsule endoscopy stop taking any GLP1 medications.   3.07/13/2024 Two (2) days prior to capsule endoscopy stop taking aspirin or any arthritis drugs.  4.07/14/2024 Day before capsule endoscopy purchase a 238 gram bottle of Miralax  from the laxative section of your drug store, and a 32 oz. bottle of Gatorade (no red).    5.07/14/2024 One (1) day prior to capsule endoscopy: Stop smoking. Eat a regular diet until 12:00 Noon. After 12:00 Noon take only the following: Black coffee  Jell-O (no fruit or red Jell-o) Water   Bouillon (chicken or beef) 7-Up   Cranberry Juice Tea   Kool-Aid Popsicle (not red) Sprite   Coke Ginger Ale  Pepsi Mountain Dew Gatorade At 6:00 pm the evening before your appointment, drink 7 capfuls (105 grams) of Miralax  with 32 oz. Gatorade. Drink 8 oz every 15 minutes until gone. Nothing to eat or drink after midnight except medications with a sip of water.  6.07/15/2024 Day of capsule endoscopy:  No medications for 2 hours prior to your test.  7. Please arrive at [facility]  3rd floor patient registration area by 8:30am on: 07/15/2024.   For any questions: Call [facility] at [phone] and ask to speak with one of the capsule endoscopy nurses.  YOU WILL NEED TO RETURN THE EQUIPMENT AT 4 PM ON THE DAY OF THE PROCEDURE.  PLEASE KEEP THIS IN MIND WHEN SCHEDULING.   The above instructions have been reviewed and explained to me by________________   Patient signature:_________________________________________     Date:________________  Small Bowel Capsule Endoscopy  What you should know: Small Bowel capsule endoscopy is a procedure that takes pictures of the inside of your small intestine (bowel).  Your small bowel connects to  your stomach on one end, and your large bowel (colon) on the other.  A capsule endoscopy is done by swallowing a pill size camera.  The capsule moves through your stomach and into your small bowel, where pictures are taken.   You may need a small bowel capsule endoscopy if you have symptoms, such as blood in your stool, chronic stomach pain, and diarrhea.  The pictures may show if you have growths, swelling, and bleeding area in you small bowel.  A capsule endoscopy may also show if diseases such as Crohn's or celiac disease are causing your symptoms.  Having a small bowel capsule endoscopy may help you and your caregiver learn the cause of your symptoms.  Learning what is causing your symptoms allows you to receive needed treatment and prevent further problems. Risks: You may have stomach pain during your procedure.   The pictures taken by the capsule may not be clear.   The pictures may not show the cause of your symptoms.   You may need another endoscopy procedure.   The capsule may get trapped in your esophagus or intestines. You may need surgery or additional procedures to remove the capsule from your body.    Before your procedure: You will be instructed to stop certain prescription medications or over- the -counter medications prior to the procedure.   The day before your scheduled appointment you will need to be on a restricted diet and will need to drink a bowel prep that will clean out your bowels.   The day of the procedure: You  may drive yourself to the procedure.   You will need to plan on 2 trips to the office on the day of the procedure. Morning: Plan to be at the office about 45 minutes. The morning of the procedure a sensor belt and recorder will be placed on you.  You will wear this for 8 hours.  (The sensor belt transfers pictures of your small bowel to the recorder.)   You will be given a pill-sized capsule endoscope to swallow.  Once you swallow the capsule it will travel  through your body the same way food does, constantly taking pictures along the way.  The capsule takes 2-3 pictures a second.   Once you have left the office you may go about your normal day with a few exceptions: You may not go near a MRI machine or a radio or television towers; You need to avoid other patients having capsule endoscopy; You will be given a written diet to follow for the day.  Afternoon: You will need to be return to the office at your designated time. The sensors belt will be removed You will need to be at the office about 15 minutes.  _______________________________________________________  If your blood pressure at your visit was 140/90 or greater, please contact your primary care physician to follow up on this.  _______________________________________________________  If you are age 68 or older, your body mass index should be between 23-30. Your Body mass index is 28.24 kg/m. If this is out of the aforementioned range listed, please consider follow up with your Primary Care Provider.  If you are age 68 or younger, your body mass index should be between 19-25. Your Body mass index is 28.24 kg/m. If this is out of the aformentioned range listed, please consider follow up with your Primary Care Provider.   ________________________________________________________  The [facility] providers would like to encourage you to use MYCHART to communicate with providers for non-urgent requests or questions.  Due to long hold times on the telephone, sending your provider a message by [facility] may be a faster and more efficient way to get a response.  Please allow 48 business hours for a response.  Please remember that this is for non-urgent requests.  _______________________________________________________  [facility] is using a team-based approach to care.  Your team is made up of your doctor and two to three APPS. Our APPS (Nurse Practitioners and Physician Assistants)  work with your physician to ensure care continuity for you. They are fully qualified to address your health concerns and develop a treatment plan. They communicate directly with your gastroenterologist to care for you. Seeing the Advanced Practice Practitioners on your physician's team can help you by facilitating care more promptly, often allowing for earlier appointments, access to diagnostic testing, procedures, and other specialty referrals.   Due to recent changes in healthcare laws, you may see the results of your imaging and laboratory studies on MyChart before your provider has had a chance to review them.  We understand that in some cases there may be results that are confusing or concerning to you. Not all laboratory results come back in the same time frame and the provider may be waiting for multiple results in order to interpret others.  Please give us  48 hours in order for your provider to thoroughly review all the results before contacting the office for clarification of your results.

## 2024-06-27 NOTE — Telephone Encounter (Signed)
 Last note and message faxed from Epic to office.

## 2024-06-30 ENCOUNTER — Inpatient Hospital Stay

## 2024-06-30 ENCOUNTER — Telehealth: Payer: Self-pay | Admitting: Gastroenterology

## 2024-06-30 ENCOUNTER — Encounter: Payer: Self-pay | Admitting: Family Medicine

## 2024-06-30 VITALS — BP 125/64 | HR 76 | Temp 97.2°F | Resp 18

## 2024-06-30 DIAGNOSIS — D649 Anemia, unspecified: Secondary | ICD-10-CM | POA: Diagnosis not present

## 2024-06-30 DIAGNOSIS — D509 Iron deficiency anemia, unspecified: Secondary | ICD-10-CM

## 2024-06-30 MED ORDER — IRON SUCROSE 20 MG/ML IV SOLN
200.0000 mg | Freq: Once | INTRAVENOUS | Status: AC
  Administered 2024-06-30: 200 mg via INTRAVENOUS

## 2024-06-30 NOTE — Progress Notes (Signed)
 ____________________________________________________________  Attending physician addendum:  Thank you for sending this case to me. I have reviewed the entire note and agree with the plan.  VCE entirely reasonable given this persistent anemia requiring iron  infusions.  Not clear if there is GI blood loss or perhaps an iron  malabsorption issue and/or other marrow production problems.  Iron  saturation is low but ferritin well into the normal range. He is also on a PPI that could conceivably be contributing to iron  deficiency, but would not typically due so to this degree.  He should also have celiac testing with a TTG IgA and a total IgA level.  Victory Brand, MD  ____________________________________________________________

## 2024-06-30 NOTE — Patient Instructions (Signed)

## 2024-06-30 NOTE — Telephone Encounter (Signed)
 The pt has been advised and will come in for the entered labs as able

## 2024-06-30 NOTE — Telephone Encounter (Signed)
 Dr. Legrand has advised we test for celiac disease in addition to moving forward with capsule endoscopy.  If patient is agreeable, please have him come to the lab to check a TTG IgA and total IgA level.

## 2024-07-01 ENCOUNTER — Telehealth: Payer: Self-pay

## 2024-07-01 NOTE — Telephone Encounter (Signed)
 I'm working on that but I still need input from ortho.  There are multiple considerations in his case.  Thanks.

## 2024-07-01 NOTE — Telephone Encounter (Signed)
 See below.  Please call ortho. I need input from Dr. Laqueta going forward.

## 2024-07-01 NOTE — Telephone Encounter (Unsigned)
 Copied from CRM #[id number]. Topic: Clinical - Medication Question >> Jul 01, 2024 12:42 PM [name] wrote: Reason for CRM: pt calling to let pcpc know he stopped the cipro   Pt needs the prescription for amitriptyline that was discussed with dr. Cleatus .

## 2024-07-01 NOTE — Telephone Encounter (Signed)
 A message has been left with [facility] to return my call about the pt.

## 2024-07-02 ENCOUNTER — Other Ambulatory Visit (INDEPENDENT_AMBULATORY_CARE_PROVIDER_SITE_OTHER)

## 2024-07-02 ENCOUNTER — Other Ambulatory Visit: Payer: Self-pay | Admitting: Family Medicine

## 2024-07-02 DIAGNOSIS — D509 Iron deficiency anemia, unspecified: Secondary | ICD-10-CM | POA: Diagnosis not present

## 2024-07-03 ENCOUNTER — Ambulatory Visit: Payer: Self-pay | Admitting: Gastroenterology

## 2024-07-03 LAB — TISSUE TRANSGLUTAMINASE, IGA: (tTG) Ab, IgA: <1 U/mL

## 2024-07-03 LAB — IGA: Immunoglobulin A: 278 mg/dL (ref 70–320)

## 2024-07-04 ENCOUNTER — Telehealth: Payer: Self-pay

## 2024-07-04 NOTE — Telephone Encounter (Signed)
 Copied from CRM [id number]. Topic: Clinical - Home Health Verbal Orders >> Jul 04, 2024  8:02 AM Berneda FALCON wrote: Caller/Agency: Damien Kitty PT with [facility] Callback Number: [phone] (secured line) Service Requested: Physical Therapy Frequency: Holding frequency for the time being. Any new concerns about the patient? Yes, did not change podiatrist, but waiting until after podiatry appt to find out if they can progress him. Holding visits until then. Appt is 12/22 for this. Patient is non compliant for weight bearing status.

## 2024-07-04 NOTE — Telephone Encounter (Signed)
 Agree. Thanks

## 2024-07-07 ENCOUNTER — Other Ambulatory Visit: Payer: Self-pay | Admitting: Family Medicine

## 2024-07-07 MED ORDER — LANTUS SOLOSTAR 100 UNIT/ML ~~LOC~~ SOPN: 22.0000 [IU] | PEN_INJECTOR | Freq: Every day | SUBCUTANEOUS | 1 refills | Status: AC

## 2024-07-07 NOTE — Telephone Encounter (Signed)
 Verbal given as requested.  No further action needed at this time.

## 2024-07-07 NOTE — Progress Notes (Unsigned)
 Late entry. I talked with Dr. Laqueta last week and he is going to manage pain/meds for the patient.  We talked about potentially trying amitriptyline.  There is a concern about pt's low sodium and concurrent Cymbalta  use.  Please fax my most recent office visit note and the labs from November 10 onward to [facility], Attn Dr. Laqueta.    I thank all involved.  Please list me as the PCP.

## 2024-07-15 ENCOUNTER — Ambulatory Visit (INDEPENDENT_AMBULATORY_CARE_PROVIDER_SITE_OTHER): Admitting: Gastroenterology

## 2024-07-15 DIAGNOSIS — D500 Iron deficiency anemia secondary to blood loss (chronic): Secondary | ICD-10-CM

## 2024-07-15 NOTE — Progress Notes (Signed)
 ID: [id number] Exp: 11/23/25 LOT: 11/23/25  Patient arrived for VCE. Reported the prep went well. This RN explained capsule dietary restrictions for the next few hours. Pt advised to return at 4 pm to return capsule equipment.  Patient verbalized understanding. Opened capsule, ensured capsule was flashing prior to the patient swallowing the capsule. Patient swallowed capsule without difficulty.  Patient told to call the office with any questions and if capsule has not passed after 72 hours. No further questions by the conclusion of the visit.

## 2024-07-15 NOTE — Patient Instructions (Signed)
 You may have clear liquids beginning at 10:30 am after ingesting the capsule.    You can have a light lunch at 12:30 pm; sandwich and half bowl of soup.  Return to the office at 4 pm to return the equipment.   Return to you normal diet at 5 pm.   Call [phone] and ask for Dottie if you have any questions.  You should pass the capsule in your stool 8-48 hours after ingestion. If you have not passed the capsule, after 72 hours, please contact the office at [phone].

## 2024-07-21 ENCOUNTER — Encounter: Payer: Self-pay | Admitting: Internal Medicine

## 2024-07-22 ENCOUNTER — Telehealth: Payer: Self-pay

## 2024-07-22 ENCOUNTER — Other Ambulatory Visit: Payer: Self-pay | Admitting: Family Medicine

## 2024-07-22 NOTE — Telephone Encounter (Signed)
 Called the patient. No answer. Left message of my call.

## 2024-07-22 NOTE — Telephone Encounter (Addendum)
 Spoke with Ruby. Advised of situation and of the plan. Ruby said to call her and leave a message with the new date for repeating the capsule endoscopy. Ruby confirms the patient did pass the capsule.

## 2024-07-22 NOTE — Telephone Encounter (Signed)
-----   Message from Greig Corti sent at 07/18/2024  1:32 PM EST ----- Regarding: Capsule Dr Legrand- this was an Incomplete Capsule study - did not reach colon at 5 hrs 35 minutes , also the system did not capture IMAGES  some portions  of the study ,t hough what was visualized is unremarkable.I put the reports on your desk  Pt should not be charged for the study, and would consider getting him rescheduled after we sort out what the issue is as we just got a new recorder box.  Also Beth/ Rock- -please call pt and be sure he passed the capsule - if not needs KUB week of 12/29

## 2024-07-25 ENCOUNTER — Other Ambulatory Visit: Payer: Self-pay | Admitting: Family

## 2024-07-25 ENCOUNTER — Other Ambulatory Visit: Payer: Self-pay | Admitting: *Deleted

## 2024-07-25 ENCOUNTER — Other Ambulatory Visit: Payer: Self-pay | Admitting: Family Medicine

## 2024-07-25 DIAGNOSIS — R7989 Other specified abnormal findings of blood chemistry: Secondary | ICD-10-CM

## 2024-07-25 MED ORDER — LEVOTHYROXINE SODIUM 100 MCG PO TABS: 100.0000 ug | ORAL_TABLET | Freq: Every day | ORAL | 1 refills | Status: AC

## 2024-07-28 ENCOUNTER — Other Ambulatory Visit: Payer: Self-pay

## 2024-07-28 ENCOUNTER — Ambulatory Visit: Admitting: Gastroenterology

## 2024-07-28 DIAGNOSIS — D500 Iron deficiency anemia secondary to blood loss (chronic): Secondary | ICD-10-CM

## 2024-07-28 MED ORDER — POLYETHYLENE GLYCOL 3350 17 GM/SCOOP PO POWD: ORAL | 0 refills | Status: DC

## 2024-07-28 NOTE — Telephone Encounter (Signed)
 Sent. Thanks.

## 2024-07-28 NOTE — Telephone Encounter (Signed)
 Patient rescheduled for 08/14/24. New instructions sent through My Chart.

## 2024-07-30 ENCOUNTER — Telehealth: Payer: Self-pay | Admitting: Gastroenterology

## 2024-07-30 NOTE — Addendum Note (Signed)
 Addended by: LEGRAND VICTORY CROME on: 07/30/2024 04:49 PM   Modules accepted: Level of Service

## 2024-07-30 NOTE — Telephone Encounter (Signed)
 Video capsule study done 07/15/2024 for iron  deficiency anemia  Technical problem with the recording equipment led to failure capturing images during portions of the study.  In addition, the capsule was not seen entering the colon when the last image was acquired.  Therefore, 2 things needed:  1 -if the patient has not seen the video capsule pass, then order single view KUB  2-once we have confirmed passage of the previous capsule, reschedule video capsule study (no charge will be placed for this one)   VEAR Brand MD

## 2024-07-31 NOTE — Telephone Encounter (Signed)
 The pt has been set up for capsule and instructed by [facility]. Nothing further needed,.

## 2024-08-01 ENCOUNTER — Other Ambulatory Visit: Payer: Self-pay

## 2024-08-01 ENCOUNTER — Inpatient Hospital Stay: Admitting: Internal Medicine

## 2024-08-01 ENCOUNTER — Encounter: Payer: Self-pay | Admitting: Internal Medicine

## 2024-08-01 ENCOUNTER — Inpatient Hospital Stay: Attending: Nurse Practitioner

## 2024-08-01 VITALS — BP 107/68 | HR 89 | Temp 97.6°F | Resp 18 | Ht 71.0 in | Wt 209.4 lb

## 2024-08-01 DIAGNOSIS — M1990 Unspecified osteoarthritis, unspecified site: Secondary | ICD-10-CM | POA: Diagnosis not present

## 2024-08-01 DIAGNOSIS — Z9841 Cataract extraction status, right eye: Secondary | ICD-10-CM | POA: Insufficient documentation

## 2024-08-01 DIAGNOSIS — Z882 Allergy status to sulfonamides status: Secondary | ICD-10-CM | POA: Diagnosis not present

## 2024-08-01 DIAGNOSIS — Z79899 Other long term (current) drug therapy: Secondary | ICD-10-CM | POA: Insufficient documentation

## 2024-08-01 DIAGNOSIS — L97529 Non-pressure chronic ulcer of other part of left foot with unspecified severity: Secondary | ICD-10-CM | POA: Insufficient documentation

## 2024-08-01 DIAGNOSIS — D649 Anemia, unspecified: Secondary | ICD-10-CM

## 2024-08-01 DIAGNOSIS — E538 Deficiency of other specified B group vitamins: Secondary | ICD-10-CM | POA: Insufficient documentation

## 2024-08-01 DIAGNOSIS — E785 Hyperlipidemia, unspecified: Secondary | ICD-10-CM | POA: Insufficient documentation

## 2024-08-01 DIAGNOSIS — I10 Essential (primary) hypertension: Secondary | ICD-10-CM | POA: Diagnosis not present

## 2024-08-01 DIAGNOSIS — Z89422 Acquired absence of other left toe(s): Secondary | ICD-10-CM | POA: Insufficient documentation

## 2024-08-01 DIAGNOSIS — D509 Iron deficiency anemia, unspecified: Secondary | ICD-10-CM

## 2024-08-01 DIAGNOSIS — Z9842 Cataract extraction status, left eye: Secondary | ICD-10-CM | POA: Diagnosis not present

## 2024-08-01 DIAGNOSIS — Z888 Allergy status to other drugs, medicaments and biological substances status: Secondary | ICD-10-CM | POA: Insufficient documentation

## 2024-08-01 DIAGNOSIS — Z860101 Personal history of adenomatous and serrated colon polyps: Secondary | ICD-10-CM | POA: Insufficient documentation

## 2024-08-01 DIAGNOSIS — R5383 Other fatigue: Secondary | ICD-10-CM | POA: Diagnosis not present

## 2024-08-01 DIAGNOSIS — L409 Psoriasis, unspecified: Secondary | ICD-10-CM | POA: Insufficient documentation

## 2024-08-01 DIAGNOSIS — Z833 Family history of diabetes mellitus: Secondary | ICD-10-CM | POA: Diagnosis not present

## 2024-08-01 DIAGNOSIS — L97519 Non-pressure chronic ulcer of other part of right foot with unspecified severity: Secondary | ICD-10-CM | POA: Diagnosis not present

## 2024-08-01 DIAGNOSIS — Z886 Allergy status to analgesic agent status: Secondary | ICD-10-CM | POA: Diagnosis not present

## 2024-08-01 LAB — CBC WITH DIFFERENTIAL (CANCER CENTER ONLY)
Abs Immature Granulocytes: 0.01 K/uL (ref 0.00–0.07)
Basophils Absolute: 0 K/uL (ref 0.0–0.1)
Basophils Relative: 1 %
Eosinophils Absolute: 0.2 K/uL (ref 0.0–0.5)
Eosinophils Relative: 3 %
HCT: 35.8 % — ABNORMAL LOW (ref 39.0–52.0)
Hemoglobin: 11.5 g/dL — ABNORMAL LOW (ref 13.0–17.0)
Immature Granulocytes: 0 %
Lymphocytes Relative: 29 %
Lymphs Abs: 1.9 K/uL (ref 0.7–4.0)
MCH: 29.5 pg (ref 26.0–34.0)
MCHC: 32.1 g/dL (ref 30.0–36.0)
MCV: 91.8 fL (ref 80.0–100.0)
Monocytes Absolute: 0.5 K/uL (ref 0.1–1.0)
Monocytes Relative: 8 %
Neutro Abs: 3.8 K/uL (ref 1.7–7.7)
Neutrophils Relative %: 59 %
Platelet Count: 298 K/uL (ref 150–400)
RBC: 3.9 MIL/uL — ABNORMAL LOW (ref 4.22–5.81)
RDW: 14.7 % (ref 11.5–15.5)
WBC Count: 6.5 K/uL (ref 4.0–10.5)
nRBC: 0 % (ref 0.0–0.2)

## 2024-08-01 LAB — CMP (CANCER CENTER ONLY)
ALT: 23 U/L (ref 0–44)
AST: 24 U/L (ref 15–41)
Albumin: 4.4 g/dL (ref 3.5–5.0)
Alkaline Phosphatase: 133 U/L — ABNORMAL HIGH (ref 38–126)
Anion gap: 10 (ref 5–15)
BUN: 26 mg/dL — ABNORMAL HIGH (ref 8–23)
CO2: 28 mmol/L (ref 22–32)
Calcium: 9.9 mg/dL (ref 8.9–10.3)
Chloride: 99 mmol/L (ref 98–111)
Creatinine: 1.2 mg/dL (ref 0.61–1.24)
GFR, Estimated: >60 mL/min
Glucose, Bld: 135 mg/dL — ABNORMAL HIGH (ref 70–99)
Potassium: 4.9 mmol/L (ref 3.5–5.1)
Sodium: 136 mmol/L (ref 135–145)
Total Bilirubin: 0.2 mg/dL (ref 0.0–1.2)
Total Protein: 7.2 g/dL (ref 6.5–8.1)

## 2024-08-01 LAB — FERRITIN: Ferritin: 360 ng/mL — ABNORMAL HIGH (ref 24–336)

## 2024-08-01 LAB — IRON AND TIBC
Iron: 57 ug/dL (ref 45–182)
Saturation Ratios: 20 % (ref 17.9–39.5)
TIBC: 291 ug/dL (ref 250–450)
UIBC: 234 ug/dL

## 2024-08-01 MED ORDER — CYANOCOBALAMIN 1000 MCG/ML IJ SOLN: INTRAMUSCULAR | 1 refills | Status: AC

## 2024-08-01 NOTE — Assessment & Plan Note (Addendum)
#   Anemia- mild Hb 12; normocytic [since MAY 2023]-DEC 2023-  Iron  studies; ferritin no evidence of iron  deficiency; myeloma panel kappa lambda light chain; hepatitis screening; LDH haptoglobin; CRP-within normal limit.  Peripheral smear no evidence of schistocytes.  OCT 2024- Bone marrow Biopsy: Variably cellular bone marrow with trilineage hematopoiesis; The bone marrow is generally normocellular for age with trilineage  hematopoiesis.  There are mild nonspecific changes present, likely  secondary in nature.  Correlation with cytogenetic studies is  recommended. US  2024- OCT 2024-  Increased hepatic parenchymal echogenicity suggestive of steatosis.  No cholelithiasis or sonographic evidence for acute cholecystitis.  # Anemia currently 11.8  likely secondary to inflammation/infection/ [sec to toe infection]; possible IDA/B12 deficiency see below-hemoglobin proved status post IV iron  infusion November 2025.  Await iron  levels today if low iron  saturation consider infusion.  # DM- Bil Foot ulcer- [Dr.Kline, podiatry];  s/p amputations- stable.   # B12 deficiency- 141 [March 2024-; [facility]]c B12  1000 mcg every month. Await b12 levels; if elevated will need every other month  # Psoriasis- on TNF blocker-TALTZ  [Dr.Kowalski]-stable/improved.  # Diabetic DM [A1c 5.9]- on insulin - see above.   Will inform re: venofer  # DISPOSITION: # follow up in 6  months- MD; 1 weeks prior-labs- cbc/cmp; b12 levels;iron  studies;ferritin- CRP- Dr.B

## 2024-08-01 NOTE — Progress Notes (Signed)
 [facility] CONSULT NOTE  Patient Care Team: Bradley Charlie FERNS, MD as PCP - General Rennie Cindy SAUNDERS, MD as Consulting Physician (Oncology) Laqueta Ozell BIRCH, MD as Consulting Physician (Anesthesiology)  CHIEF COMPLAINTS/PURPOSE OF CONSULTATION: ANEMIA   HEMATOLOGY HISTORY  # SEP 2023- ANEMIA MCV-platelets WBC-WNL; Iron  sat; ferritin;  GFR- CT/US ; EGD- in JAn 2024 /colonoscopy-dec 21st, 2023; prior 3-4 year [Dr.[facility]Baur [facility]- [facility]]- DEC 2023-  Iron  studies; ferritin no evidence of iron  deficiency; myeloma panel kappa lambda light chain; hepatitis screening; LDH haptoglobin; CRP-within normal limit.  Peripheral smear no evidence of schistocytes. APRIl 2024- B12 141. Started B12 injections; OCThe bone marrow is generally normocellular for age with trilineage  hematopoiesis.  There are mild nonspecific changes present, likely  secondary in nature.  Correlation with cytogenetic studies is  recommended.  #    Latest Reference Range & Units Most Recent 12/14/21 10:08 03/29/22 13:12 06/19/22 15:18 06/28/22 11:16  Hemoglobin 13.0 - 17.7 g/dL 87.9 (L) 87/3/76 88:83 13.4 12.2 (L) 11.9 (L) 12.0 (L)  (L): Data is abnormally low  Latest Reference Range & Units 06/28/22 11:16  Iron  38 - 169 ug/dL 98  UIBC 888 - 656 ug/dL 785  TIBC 749 - 549 ug/dL 687  Ferritin 30 - 599 ng/mL 143  Iron  Saturation 15 - 55 % 31    # Psoraiss [Dx- 18 years]- 2023- started TNF   HISTORY OF PRESENTING ILLNESS: alone. In a boot   [name] 69 y.o.  male pleasant patient with mild anemia likley secondary chronic diease/infection' possible IDA/b12 deficiency- follow-up-] is here for a follow up-  Discussed the use of AI scribe software for clinical note transcription with the patient, who gave verbal consent to proceed.  History of Present Illness   [name] is a 69 year old male with anemia and vitamin B12 deficiency who presents for hematology follow-up after recent foot surgery and  intravenous antibiotics.  He underwent foot surgery in late October to early November 2025, which was complicated by postoperative infection requiring hospitalization and intravenous antibiotics for four to five days. Wound healing has been prolonged, but he has completed antibiotics and infectious disease follow-up.  He has persistent anemia, with recent laboratory studies showing improvement in hemoglobin from 9.8 to 11.5 g/dL. He requested evaluation of iron  status and inquired about iron  infusion.  He receives vitamin B12 injections, initially weekly and now monthly, with reported subjective benefit. His B12 levels, previously elevated, have normalized. He requested a refill for B12 injections.      Review of Systems  Constitutional:  Positive for malaise/fatigue. Negative for chills, diaphoresis, fever and weight loss.  HENT:  Negative for nosebleeds and sore throat.   Eyes:  Negative for double vision.  Respiratory:  Negative for cough, hemoptysis, sputum production, shortness of breath and wheezing.   Cardiovascular:  Negative for chest pain, palpitations, orthopnea and leg swelling.  Gastrointestinal:  Negative for abdominal pain, blood in stool, constipation, diarrhea, heartburn, melena, nausea and vomiting.  Genitourinary:  Positive for frequency. Negative for dysuria and urgency.  Musculoskeletal:  Negative for back pain and joint pain.  Skin: Negative.  Negative for itching and rash.  Neurological:  Negative for dizziness, tingling, focal weakness, weakness and headaches.  Endo/Heme/Allergies:  Does not bruise/bleed easily.  Psychiatric/Behavioral:  Negative for depression. The patient is not nervous/anxious and does not have insomnia.      MEDICAL HISTORY:  Past Medical History:  Diagnosis Date   Adenomatous polyp  Allergy     Anemia    Arthritis    feet    Bunion 04/02/2013   STATUS POST OP BUN REPAIR   Diabetes mellitus    Foot ulcer (HCC)    GERD  (gastroesophageal reflux disease)    Gout    Hammertoe    Hyperlipidemia    Hypertension    Neuromuscular disorder (HCC)    neuropathy   Plantar flexed metatarsal    Psoriasis    Schatzki's ring     SURGICAL HISTORY: Past Surgical History:  Procedure Laterality Date   ACHILLES TENDON LENGTHENING Right 05/23/2024   Procedure: LENGTHENING, TENDON, ACHILLES;  Surgeon: Ashley Soulier, DPM;  Location: [facility];  Service: Orthopedics/Podiatry;  Laterality: Right;   AMPUTATION TOE Left 05/08/2023   Procedure: AMPUTATION 5TH TOE WITH PARTIAL RAY RESECTION;  Surgeon: Neill Boas, DPM;  Location: [facility];  Service: Orthopedics/Podiatry;  Laterality: Left;   CARPAL TUNNEL RELEASE Left 08/24/2022   Procedure: CARPAL TUNNEL RELEASE;  Surgeon: Cleotilde Barrio, MD;  Location: [facility];  Service: Orthopedics;  Laterality: Left;   CATARACT EXTRACTION W/PHACO Left 03/21/2023   Procedure: CATARACT EXTRACTION PHACO AND INTRAOCULAR LENS PLACEMENT (IOC) LEFT MALYUGIN DIABETIC 9.88 1:01.4;  Surgeon: Mittie Gaskin, MD;  Location: [facility];  Service: Ophthalmology;  Laterality: Left;   CATARACT EXTRACTION W/PHACO Right 04/04/2023   Procedure: CATARACT EXTRACTION PHACO AND INTRAOCULAR LENS PLACEMENT (IOC) RIGHT MALYUGIN DIABETIC 15.31 01:15.1;  Surgeon: Mittie Gaskin, MD;  Location: [facility];  Service: Ophthalmology;  Laterality: Right;   COLONOSCOPY     EYE SURGERY     Cataracts   FOOT AMPUTATION Right 01/11/2017   Dr Tisa ([facility])--transmetatarsal   FOOT SURGERY Right 02/28/2013   ALLEEN HAM TOE2,3 PINS ,2ND MET OSTEOTOMY, 5TH MET W/SCREW   02-2013, 04-2013   HIP FRACTURE SURGERY  1993   surgery x2   IR BONE MARROW BIOPSY & ASPIRATION  04/27/2023   IRRIGATION AND DEBRIDEMENT FOOT Right 05/23/2024   Procedure: IRRIGATION AND DEBRIDEMENT FOOT;  Surgeon: Ashley Soulier, DPM;  Location: [facility];  Service: Orthopedics/Podiatry;  Laterality: Right;   JOINT REPLACEMENT      LOWER EXTREMITY ANGIOGRAPHY Left 05/07/2023   Procedure: Lower Extremity Angiography;  Surgeon: Marea Selinda RAMAN, MD;  Location: [facility];  Service: Cardiovascular;  Laterality: Left;   NASAL SEPTOPLASTY W/ TURBINOPLASTY Bilateral 02/08/2023   Procedure: NASAL SEPTOPLASTY WITH TURBINATE REDUCTION;  Surgeon: Edda Mt, MD;  Location: [facility];  Service: ENT;  Laterality: Bilateral;   POLYPECTOMY     PROSTATE BIOPSY  10/10/2019   TOTAL HIP ARTHROPLASTY     Left hip replacement/femur fx 07/04, Screw removal left hip- [facility] 11/01    SOCIAL HISTORY: Social History   Socioeconomic History   Marital status: Married    Spouse name: Not on file   Number of children: 3   Years of education: Not on file   Highest education level: 12th grade  Occupational History   Occupation: PC ADMINISTRATOR    Employer: [employer]    Comment: Retired 3/25  Tobacco Use   Smoking status: Never    Passive exposure: Past   Smokeless tobacco: Never  Vaping Use   Vaping status: Never Used  Substance and Sexual Activity   Alcohol  use: Yes    Alcohol /week: 1.0 standard drink of alcohol     Types: 1 Standard drinks or equivalent per week    Comment: occasional (DWI 1991)   Drug use: No   Sexual activity: Yes  Birth control/protection: None  Other Topics Concern   Not on file  Social History Narrative   alcohol  2-3 times a week; never smoked; lives in [place]. With wife; and 2 dog.        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     Social Drivers of Health   Tobacco Use: Low Risk (08/01/2024)   Patient History    Smoking Tobacco Use: Never    Smokeless Tobacco Use: Never    Passive Exposure: Past  Financial Resource Strain: Low Risk  (05/13/2024)   Received from [facility]   Overall Financial Resource Strain (CARDIA)     Difficulty of Paying Living Expenses: Not hard at all  Food Insecurity: No Food Insecurity (05/21/2024)   Epic    Worried About Radiation Protection Practitioner of Food in the Last Year: Never true    Ran Out of Food in the Last Year: Never true  Transportation Needs: No Transportation Needs (05/21/2024)   Epic    Lack of Transportation (Medical): No    Lack of Transportation (Non-Medical): No  Physical Activity: Inactive (03/03/2024)   Exercise Vital Sign    Days of Exercise per Week: 0 days    Minutes of Exercise per Session: Not on file  Stress: No Stress Concern Present (03/03/2024)   [employer] of Occupational Health - Occupational Stress Questionnaire    Feeling of Stress: Only a little  Social Connections: Unknown (05/21/2024)   Social Connection and Isolation Panel    Frequency of Communication with Friends and Family: Twice a week    Frequency of Social Gatherings with Friends and Family: Patient declined    Attends Religious Services: More than 4 times per year    Active Member of Clubs or Organizations: No    Attends Banker Meetings: Not on file    Marital Status: Married  Intimate Partner Violence: Not At Risk (05/21/2024)   Epic    Fear of Current or Ex-Partner: No    Emotionally Abused: No    Physically Abused: No    Sexually Abused: No  Depression (PHQ2-9): Low Risk (08/01/2024)   Depression (PHQ2-9)    PHQ-2 Score: 0  Recent Concern: Depression (PHQ2-9) - Medium Risk (06/23/2024)   Depression (PHQ2-9)    PHQ-2 Score: 6  Alcohol  Screen: Low Risk (03/04/2024)   Alcohol  Screen    Last Alcohol  Screening Score (AUDIT): 0  Housing: Low Risk (05/21/2024)   Epic    Unable to Pay for Housing in the Last Year: No    Number of Times Moved in the Last Year: 0    Homeless in the Last Year: No  Utilities: Not At Risk (05/21/2024)   Epic    Threatened with loss of utilities: No  Health Literacy: Adequate Health Literacy (03/04/2024)   B1300 Health Literacy    Frequency of  need for help with medical instructions: Never    FAMILY HISTORY: Family History  Problem Relation Age of Onset   Diabetes Mother    Early death Father    Diabetes Brother    Alzheimer's disease Maternal Aunt    Alzheimer's disease Cousin    Cancer Neg Hx    Colon cancer Neg Hx    Colon polyps Neg Hx    Rectal cancer Neg Hx    Stomach  cancer Neg Hx    Esophageal cancer Neg Hx     ALLERGIES:  is allergic to glipizide, lisinopril , ramipril, ibuprofen, lyrica  [pregabalin ], and bactrim  [sulfamethoxazole -trimethoprim ].  MEDICATIONS:  Current Outpatient Medications  Medication Sig Dispense Refill   Alcohol  Swabs  (ALCOHOL  PREP) 70 % PADS USE 1 PREP PAD TWICE DAILY 200 each 3   Alpha-Lipoic Acid 600 MG TABS Take by mouth. 3 in am and 1 in pm     CALCIUM-MAGNESIUM -ZINC  PO Take by mouth.     Cinnamon 500 MG capsule Take 500 mg by mouth 2 (two) times daily.     clobetasol cream (TEMOVATE) 0.05 % Apply 1 Application topically 2 (two) times daily as needed.     CONTOUR NEXT TEST test strip USE 1 TO 2 TIMES DAILY TO CHECK  BLOOD SUGAR 200 strip 3   dapagliflozin  propanediol (FARXIGA ) 5 MG TABS tablet Take 1 tablet (5 mg total) by mouth daily. 90 tablet 3   DULoxetine  (CYMBALTA ) 60 MG capsule Take 2 capsules (120 mg total) by mouth daily. 180 capsule 3   finasteride  (PROSCAR ) 5 MG tablet Take 1 tablet (5 mg total) by mouth daily. 90 tablet 3   furosemide  (LASIX ) 20 MG tablet TAKE 1 TABLET(20 MG) BY MOUTH DAILY AS NEEDED 90 tablet 0   gabapentin  (NEURONTIN ) 400 MG capsule Take 2 capsules (800 mg total) by mouth 3 (three) times daily. 540 capsule 3   hydrocortisone  valerate ointment (WEST-CORT) 0.2 % APPLY TOPICALLY TO AFFECTED  AREA(S) TWICE DAILY 120 g 1   hydrOXYzine  (VISTARIL ) 25 MG capsule TAKE 1 CAPSULE(25 MG) BY MOUTH EVERY 8 HOURS AS NEEDED FOR ITCHING 60 capsule 3   insulin  glargine (LANTUS  SOLOSTAR) 100 UNIT/ML Solostar Pen Inject 22 Units into the skin daily. 15 mL 1   Insulin  Pen  Needle (B-D UF III MINI PEN NEEDLES) 31G X 5 MM MISC USE AS DIRECTED DAILY 90 each 3   levothyroxine  (SYNTHROID ) 100 MCG tablet Take 1 tablet (100 mcg total) by mouth daily at 6 (six) AM. 30 tablet 1   losartan  (COZAAR ) 25 MG tablet Take 0.5 tablets (12.5 mg total) by mouth daily.     metFORMIN  (GLUCOPHAGE ) 1000 MG tablet Take 1 tablet (1,000 mg total) by mouth 2 (two) times daily with a meal. 180 tablet 3   Multiple Vitamin (MULTIVITAMIN WITH MINERALS) TABS tablet Take 1 tablet by mouth daily. 30 tablet 2   oxyCODONE -acetaminophen  (PERCOCET) 10-325 MG tablet Take 1 tablet by mouth every 4 (four) hours as needed for pain.     pantoprazole  (PROTONIX ) 40 MG tablet Take 1 tablet (40 mg total) by mouth daily. 90 tablet 1   simvastatin  (ZOCOR ) 40 MG tablet TAKE 1 TABLET(40 MG) BY MOUTH AT BEDTIME 90 tablet 0   SYRINGE-NEEDLE, DISP, 3 ML (BD SAFETYGLIDE SYRINGE/NEEDLE) 25G X 1 3 ML MISC Use the needle for IM injection once a  month; or as directed. 30 each 1   tamsulosin  (FLOMAX ) 0.4 MG CAPS capsule Take 1 capsule (0.4 mg total) by mouth daily. 90 capsule 3   tirzepatide  (MOUNJARO ) 5 MG/0.5ML Pen Inject 5 mg into the skin once a week. 2 mL 5   triamcinolone  lotion (KENALOG ) 0.1 % APPLY TO AFFECTED AREA(S)  TOPICALLY 3 TIMES DAILY 180 mL 1   cyanocobalamin  (VITAMIN B12) 1000 MCG/ML injection 1 ml injection-once a month. 12 mL 1   HYDROcodone -acetaminophen  (NORCO) 10-325 MG tablet Take 1.5 tablets by mouth every 4 (four) hours as needed. (Patient not taking: Reported on 08/01/2024)  montelukast  (SINGULAIR ) 10 MG tablet Take 10 mg by mouth daily. (Patient not taking: Reported on 08/01/2024)     No current facility-administered medications for this visit.     [name]  PHYSICAL EXAMINATION:   Vitals:   08/01/24 1026  BP: 107/68  Pulse: 89  Resp: 18  Temp: 97.6 F (36.4 C)  SpO2: 100%      Filed Weights   08/01/24 1026  Weight: 209 lb 6.4 oz (95 kg)       Physical Exam Vitals and nursing  note reviewed.  HENT:     Head: Normocephalic and atraumatic.     Mouth/Throat:     Pharynx: Oropharynx is clear.  Eyes:     Extraocular Movements: Extraocular movements intact.     Pupils: Pupils are equal, round, and reactive to light.  Cardiovascular:     Rate and Rhythm: Normal rate and regular rhythm.  Abdominal:     Palpations: Abdomen is soft.  Musculoskeletal:        General: Normal range of motion.     Cervical back: Normal range of motion.  Skin:    General: Skin is warm.  Neurological:     General: No focal deficit present.     Mental Status: He is alert and oriented to person, place, and time.  Psychiatric:        Behavior: Behavior normal.        Judgment: Judgment normal.      LABORATORY DATA:  I have reviewed the data as listed Lab Results  Component Value Date   WBC 6.5 08/01/2024   HGB 11.5 (L) 08/01/2024   HCT 35.8 (L) 08/01/2024   MCV 91.8 08/01/2024   PLT 298 08/01/2024   Recent Labs    03/05/24 0815 05/02/24 1242 05/21/24 0803 05/22/24 0400 05/26/24 1908 06/02/24 1104 06/10/24 1447 08/01/24 1028  NA 136   < > 126*   < > 128* 130* 130* 136  K 5.1   < > 4.5   < > 4.4 5.2 No hemolysis seen* 4.1 4.9  CL 99   < > 90*   < > 94* 93* 87* 99  CO2 28   < > 22   < > 25 27 31 28   GLUCOSE 133*   < > 199*   < > 160* 205* 182* 135*  BUN 31*   < > 27*   < > 17 18 26* 26*  CREATININE 1.27   < > 1.27*   < > 1.14 1.27 1.50* 1.20  CALCIUM 9.4   < > 8.6*   < > 8.4* 8.8 9.3 9.9  GFRNONAA  --   --  >60   < > >60  --  50* >60  PROT 6.6  --  6.8  --   --   --   --  7.2  ALBUMIN 4.4  --  2.7*  --   --   --   --  4.4  AST 15  --  50*  --   --   --   --  24  ALT 13  --  56*  --   --   --   --  23  ALKPHOS 116  --  211*  --   --   --   --  133*  BILITOT 0.5  --  0.9  --   --   --   --  0.2   < > = values in this interval not displayed.  No results found.  ASSESSMENT & PLAN:   Symptomatic anemia # Anemia- mild Hb 12; normocytic [since MAY 2023]-DEC  2023-  Iron  studies; ferritin no evidence of iron  deficiency; myeloma panel kappa lambda light chain; hepatitis screening; LDH haptoglobin; CRP-within normal limit.  Peripheral smear no evidence of schistocytes.  OCT 2024- Bone marrow Biopsy: Variably cellular bone marrow with trilineage hematopoiesis; The bone marrow is generally normocellular for age with trilineage  hematopoiesis.  There are mild nonspecific changes present, likely  secondary in nature.  Correlation with cytogenetic studies is  recommended. US  2024- OCT 2024-  Increased hepatic parenchymal echogenicity suggestive of steatosis.  No cholelithiasis or sonographic evidence for acute cholecystitis.  # Anemia currently 11.8  likely secondary to inflammation/infection/ [sec to toe infection]; possible IDA/B12 deficiency see below-hemoglobin proved status post IV iron  infusion November 2025.  Await iron  levels today if low iron  saturation consider infusion.  # DM- Bil Foot ulcer- [Dr.Kline, podiatry];  s/p amputations- stable.   # B12 deficiency- 141 [March 2024-; [facility]]c B12  1000 mcg every month. Await b12 levels; if elevated will need every other month  # Psoriasis- on TNF blocker-TALTZ  [Dr.Kowalski]-stable/improved.  # Diabetic DM [A1c 5.9]- on insulin - see above.   Will inform re: venofer  # DISPOSITION: # follow up in 6  months- MD; 1 weeks prior-labs- cbc/cmp; b12 levels;iron  studies;ferritin- CRP- Dr.B       All questions were answered. The patient knows to call the clinic with any problems, questions or concerns.  Cindy JONELLE Joe, MD 08/01/2024 11:21 AM

## 2024-08-01 NOTE — Progress Notes (Signed)
 Fatigue/weakness: YES Dyspena: NO Light headedness: OCC. WITH STANDING Blood in stool: NO   Pt asking for his iron  level be checked today, ordered.  Rt foot surgery 07/14/24, Dr. Ashley.  Needs refill on his B12 inj for home, pended.

## 2024-08-02 ENCOUNTER — Ambulatory Visit: Payer: Self-pay | Admitting: Internal Medicine

## 2024-08-14 ENCOUNTER — Telehealth: Payer: Self-pay | Admitting: Gastroenterology

## 2024-08-14 ENCOUNTER — Encounter

## 2024-08-14 NOTE — Telephone Encounter (Signed)
 Called the patient. Left message on the voicemail. Message through My Chart to assist with rescheduling.

## 2024-08-14 NOTE — Telephone Encounter (Signed)
 Good morning  The following patient is scheduled for a capsule endoscopy today 830am. He called in to reschedule due to him running a fever. He stated, He just isn't feeling well and very weak. Reequesting a call back with future dates.

## 2024-09-01 ENCOUNTER — Encounter

## 2024-09-05 ENCOUNTER — Encounter: Admitting: Family Medicine

## 2024-09-10 ENCOUNTER — Inpatient Hospital Stay

## 2024-09-17 ENCOUNTER — Inpatient Hospital Stay

## 2024-09-17 ENCOUNTER — Inpatient Hospital Stay: Admitting: Internal Medicine

## 2024-10-28 ENCOUNTER — Ambulatory Visit: Admitting: Internal Medicine

## 2025-01-22 ENCOUNTER — Inpatient Hospital Stay

## 2025-01-30 ENCOUNTER — Inpatient Hospital Stay: Admitting: Internal Medicine

## 2025-06-24 ENCOUNTER — Ambulatory Visit: Admitting: Urology
